# Patient Record
Sex: Female | Born: 1935 | Race: White | Hispanic: No | Smoking: Never smoker
Health system: Southern US, Community
[De-identification: ages and names within clinical notes are randomized; demographics above are authoritative.]

## PROBLEM LIST (undated history)

## (undated) DIAGNOSIS — Z9109 Other allergy status, other than to drugs and biological substances: Secondary | ICD-10-CM

## (undated) DIAGNOSIS — M1990 Unspecified osteoarthritis, unspecified site: Secondary | ICD-10-CM

## (undated) DIAGNOSIS — K649 Unspecified hemorrhoids: Secondary | ICD-10-CM

## (undated) DIAGNOSIS — R040 Epistaxis: Secondary | ICD-10-CM

## (undated) DIAGNOSIS — N8111 Cystocele, midline: Secondary | ICD-10-CM

## (undated) DIAGNOSIS — K635 Polyp of colon: Secondary | ICD-10-CM

## (undated) DIAGNOSIS — G4730 Sleep apnea, unspecified: Secondary | ICD-10-CM

## (undated) DIAGNOSIS — F32A Depression, unspecified: Secondary | ICD-10-CM

## (undated) DIAGNOSIS — K219 Gastro-esophageal reflux disease without esophagitis: Secondary | ICD-10-CM

## (undated) DIAGNOSIS — R453 Demoralization and apathy: Secondary | ICD-10-CM

## (undated) DIAGNOSIS — G43009 Migraine without aura, not intractable, without status migrainosus: Secondary | ICD-10-CM

## (undated) DIAGNOSIS — E782 Mixed hyperlipidemia: Secondary | ICD-10-CM

## (undated) DIAGNOSIS — H169 Unspecified keratitis: Secondary | ICD-10-CM

## (undated) DIAGNOSIS — M461 Sacroiliitis, not elsewhere classified: Secondary | ICD-10-CM

## (undated) DIAGNOSIS — I10 Essential (primary) hypertension: Secondary | ICD-10-CM

## (undated) DIAGNOSIS — J329 Chronic sinusitis, unspecified: Secondary | ICD-10-CM

## (undated) DIAGNOSIS — G2589 Other specified extrapyramidal and movement disorders: Secondary | ICD-10-CM

## (undated) DIAGNOSIS — N952 Postmenopausal atrophic vaginitis: Secondary | ICD-10-CM

## (undated) DIAGNOSIS — E559 Vitamin D deficiency, unspecified: Secondary | ICD-10-CM

## (undated) DIAGNOSIS — F329 Major depressive disorder, single episode, unspecified: Secondary | ICD-10-CM

## (undated) HISTORY — DX: Other allergy status, other than to drugs and biological substances: Z91.09

## (undated) HISTORY — DX: Demoralization and apathy: R45.3

## (undated) HISTORY — DX: Major depressive disorder, single episode, unspecified: F32.9

## (undated) HISTORY — PX: SPINE SURGERY: SHX786

## (undated) HISTORY — DX: Unspecified osteoarthritis, unspecified site: M19.90

## (undated) HISTORY — DX: Depression, unspecified: F32.A

## (undated) HISTORY — DX: Sacroiliitis, not elsewhere classified: M46.1

## (undated) HISTORY — DX: Other specified extrapyramidal and movement disorders: G25.89

## (undated) HISTORY — PX: ENDOMETRIAL BIOPSY: SHX622

## (undated) HISTORY — DX: Postmenopausal atrophic vaginitis: N95.2

## (undated) HISTORY — DX: Unspecified hemorrhoids: K64.9

## (undated) HISTORY — DX: Migraine without aura, not intractable, without status migrainosus: G43.009

## (undated) HISTORY — DX: Mixed hyperlipidemia: E78.2

## (undated) HISTORY — DX: Vitamin D deficiency, unspecified: E55.9

## (undated) HISTORY — DX: Unspecified keratitis: H16.9

## (undated) HISTORY — DX: Polyp of colon: K63.5

## (undated) HISTORY — DX: Chronic sinusitis, unspecified: J32.9

## (undated) HISTORY — DX: Cystocele, midline: N81.11

## (undated) HISTORY — DX: Epistaxis: R04.0

## (undated) HISTORY — DX: Essential (primary) hypertension: I10

---

## 1968-07-13 HISTORY — PX: DILATION AND CURETTAGE OF UTERUS: SHX78

## 2005-01-09 ENCOUNTER — Ambulatory Visit: Payer: Self-pay | Admitting: Ophthalmology

## 2005-01-19 ENCOUNTER — Ambulatory Visit: Payer: Self-pay | Admitting: Ophthalmology

## 2005-02-11 ENCOUNTER — Ambulatory Visit: Payer: Self-pay | Admitting: Ophthalmology

## 2005-02-16 ENCOUNTER — Ambulatory Visit: Payer: Self-pay | Admitting: Ophthalmology

## 2005-07-13 HISTORY — PX: ROTATOR CUFF REPAIR: SHX139

## 2012-01-05 DIAGNOSIS — S300XXA Contusion of lower back and pelvis, initial encounter: Secondary | ICD-10-CM | POA: Diagnosis not present

## 2012-01-05 DIAGNOSIS — R39198 Other difficulties with micturition: Secondary | ICD-10-CM | POA: Diagnosis not present

## 2012-02-24 DIAGNOSIS — E782 Mixed hyperlipidemia: Secondary | ICD-10-CM | POA: Diagnosis not present

## 2012-02-24 DIAGNOSIS — M949 Disorder of cartilage, unspecified: Secondary | ICD-10-CM | POA: Diagnosis not present

## 2012-02-24 DIAGNOSIS — I10 Essential (primary) hypertension: Secondary | ICD-10-CM | POA: Diagnosis not present

## 2012-02-24 DIAGNOSIS — Z0000 Encounter for general adult medical examination without abnormal findings: Secondary | ICD-10-CM | POA: Diagnosis not present

## 2012-02-24 DIAGNOSIS — M899 Disorder of bone, unspecified: Secondary | ICD-10-CM | POA: Diagnosis not present

## 2012-02-24 DIAGNOSIS — E559 Vitamin D deficiency, unspecified: Secondary | ICD-10-CM | POA: Diagnosis not present

## 2012-03-29 DIAGNOSIS — M810 Age-related osteoporosis without current pathological fracture: Secondary | ICD-10-CM | POA: Diagnosis not present

## 2012-06-29 DIAGNOSIS — N8111 Cystocele, midline: Secondary | ICD-10-CM | POA: Diagnosis not present

## 2012-07-14 DIAGNOSIS — H16219 Exposure keratoconjunctivitis, unspecified eye: Secondary | ICD-10-CM | POA: Diagnosis not present

## 2012-08-30 DIAGNOSIS — M461 Sacroiliitis, not elsewhere classified: Secondary | ICD-10-CM | POA: Diagnosis not present

## 2012-10-04 DIAGNOSIS — M810 Age-related osteoporosis without current pathological fracture: Secondary | ICD-10-CM | POA: Diagnosis not present

## 2013-03-06 DIAGNOSIS — Z1331 Encounter for screening for depression: Secondary | ICD-10-CM | POA: Insufficient documentation

## 2013-03-06 DIAGNOSIS — G2589 Other specified extrapyramidal and movement disorders: Secondary | ICD-10-CM

## 2013-03-06 DIAGNOSIS — R453 Demoralization and apathy: Secondary | ICD-10-CM

## 2013-03-06 DIAGNOSIS — G43009 Migraine without aura, not intractable, without status migrainosus: Secondary | ICD-10-CM

## 2013-03-06 DIAGNOSIS — IMO0002 Reserved for concepts with insufficient information to code with codable children: Secondary | ICD-10-CM | POA: Insufficient documentation

## 2013-03-06 DIAGNOSIS — M461 Sacroiliitis, not elsewhere classified: Secondary | ICD-10-CM | POA: Insufficient documentation

## 2013-03-06 DIAGNOSIS — E559 Vitamin D deficiency, unspecified: Secondary | ICD-10-CM | POA: Insufficient documentation

## 2013-03-06 DIAGNOSIS — N952 Postmenopausal atrophic vaginitis: Secondary | ICD-10-CM | POA: Insufficient documentation

## 2013-03-06 DIAGNOSIS — N8111 Cystocele, midline: Secondary | ICD-10-CM | POA: Insufficient documentation

## 2013-03-06 DIAGNOSIS — Z1239 Encounter for other screening for malignant neoplasm of breast: Secondary | ICD-10-CM | POA: Insufficient documentation

## 2013-03-06 DIAGNOSIS — E782 Mixed hyperlipidemia: Secondary | ICD-10-CM

## 2013-03-06 DIAGNOSIS — J301 Allergic rhinitis due to pollen: Secondary | ICD-10-CM | POA: Insufficient documentation

## 2013-03-06 DIAGNOSIS — R450 Nervousness: Secondary | ICD-10-CM | POA: Insufficient documentation

## 2013-03-06 DIAGNOSIS — E785 Hyperlipidemia, unspecified: Secondary | ICD-10-CM | POA: Insufficient documentation

## 2013-03-06 HISTORY — DX: Migraine without aura, not intractable, without status migrainosus: G43.009

## 2013-03-06 HISTORY — DX: Postmenopausal atrophic vaginitis: N95.2

## 2013-03-06 HISTORY — DX: Other specified extrapyramidal and movement disorders: G25.89

## 2013-03-06 HISTORY — DX: Demoralization and apathy: R45.3

## 2013-03-06 HISTORY — DX: Sacroiliitis, not elsewhere classified: M46.1

## 2013-03-06 HISTORY — DX: Vitamin D deficiency, unspecified: E55.9

## 2013-03-06 HISTORY — DX: Cystocele, midline: N81.11

## 2013-03-06 HISTORY — DX: Mixed hyperlipidemia: E78.2

## 2013-04-25 DIAGNOSIS — M810 Age-related osteoporosis without current pathological fracture: Secondary | ICD-10-CM | POA: Diagnosis not present

## 2013-05-09 DIAGNOSIS — M545 Low back pain, unspecified: Secondary | ICD-10-CM | POA: Diagnosis not present

## 2013-05-09 DIAGNOSIS — Z23 Encounter for immunization: Secondary | ICD-10-CM | POA: Diagnosis not present

## 2013-05-16 DIAGNOSIS — M5137 Other intervertebral disc degeneration, lumbosacral region: Secondary | ICD-10-CM | POA: Diagnosis not present

## 2013-05-17 DIAGNOSIS — J301 Allergic rhinitis due to pollen: Secondary | ICD-10-CM | POA: Diagnosis not present

## 2013-05-17 DIAGNOSIS — M79609 Pain in unspecified limb: Secondary | ICD-10-CM | POA: Diagnosis not present

## 2013-05-17 DIAGNOSIS — J310 Chronic rhinitis: Secondary | ICD-10-CM | POA: Diagnosis not present

## 2013-05-17 DIAGNOSIS — Z23 Encounter for immunization: Secondary | ICD-10-CM | POA: Diagnosis not present

## 2013-05-17 DIAGNOSIS — I10 Essential (primary) hypertension: Secondary | ICD-10-CM | POA: Insufficient documentation

## 2013-05-17 DIAGNOSIS — G259 Extrapyramidal and movement disorder, unspecified: Secondary | ICD-10-CM | POA: Diagnosis not present

## 2013-05-17 DIAGNOSIS — G43909 Migraine, unspecified, not intractable, without status migrainosus: Secondary | ICD-10-CM | POA: Diagnosis not present

## 2013-05-17 DIAGNOSIS — K1370 Unspecified lesions of oral mucosa: Secondary | ICD-10-CM | POA: Diagnosis not present

## 2013-05-17 DIAGNOSIS — H2700 Aphakia, unspecified eye: Secondary | ICD-10-CM | POA: Diagnosis not present

## 2013-05-17 DIAGNOSIS — R450 Nervousness: Secondary | ICD-10-CM | POA: Diagnosis not present

## 2013-05-17 DIAGNOSIS — N952 Postmenopausal atrophic vaginitis: Secondary | ICD-10-CM | POA: Diagnosis not present

## 2013-05-17 DIAGNOSIS — E559 Vitamin D deficiency, unspecified: Secondary | ICD-10-CM | POA: Diagnosis not present

## 2013-05-17 DIAGNOSIS — M549 Dorsalgia, unspecified: Secondary | ICD-10-CM | POA: Diagnosis not present

## 2013-05-17 DIAGNOSIS — M461 Sacroiliitis, not elsewhere classified: Secondary | ICD-10-CM | POA: Diagnosis not present

## 2013-05-17 DIAGNOSIS — E782 Mixed hyperlipidemia: Secondary | ICD-10-CM | POA: Diagnosis not present

## 2013-05-17 DIAGNOSIS — Z0000 Encounter for general adult medical examination without abnormal findings: Secondary | ICD-10-CM | POA: Diagnosis not present

## 2013-05-17 HISTORY — DX: Essential (primary) hypertension: I10

## 2013-07-26 DIAGNOSIS — G2589 Other specified extrapyramidal and movement disorders: Secondary | ICD-10-CM | POA: Diagnosis not present

## 2013-07-26 DIAGNOSIS — R453 Demoralization and apathy: Secondary | ICD-10-CM | POA: Diagnosis not present

## 2013-07-26 DIAGNOSIS — I10 Essential (primary) hypertension: Secondary | ICD-10-CM | POA: Diagnosis not present

## 2013-07-26 DIAGNOSIS — M461 Sacroiliitis, not elsewhere classified: Secondary | ICD-10-CM | POA: Diagnosis not present

## 2013-08-01 DIAGNOSIS — H169 Unspecified keratitis: Secondary | ICD-10-CM

## 2013-08-01 DIAGNOSIS — M3501 Sicca syndrome with keratoconjunctivitis: Secondary | ICD-10-CM | POA: Insufficient documentation

## 2013-08-01 HISTORY — DX: Unspecified keratitis: H16.9

## 2013-09-12 DIAGNOSIS — R413 Other amnesia: Secondary | ICD-10-CM | POA: Diagnosis not present

## 2013-09-12 DIAGNOSIS — M810 Age-related osteoporosis without current pathological fracture: Secondary | ICD-10-CM | POA: Diagnosis not present

## 2013-09-12 DIAGNOSIS — R5381 Other malaise: Secondary | ICD-10-CM | POA: Diagnosis not present

## 2013-09-12 DIAGNOSIS — E559 Vitamin D deficiency, unspecified: Secondary | ICD-10-CM | POA: Diagnosis not present

## 2013-09-12 DIAGNOSIS — F411 Generalized anxiety disorder: Secondary | ICD-10-CM | POA: Diagnosis not present

## 2013-09-12 DIAGNOSIS — E039 Hypothyroidism, unspecified: Secondary | ICD-10-CM | POA: Diagnosis not present

## 2013-09-12 DIAGNOSIS — R5383 Other fatigue: Secondary | ICD-10-CM | POA: Diagnosis not present

## 2013-09-12 DIAGNOSIS — I10 Essential (primary) hypertension: Secondary | ICD-10-CM | POA: Diagnosis not present

## 2013-10-03 DIAGNOSIS — I10 Essential (primary) hypertension: Secondary | ICD-10-CM | POA: Diagnosis not present

## 2013-10-03 DIAGNOSIS — N182 Chronic kidney disease, stage 2 (mild): Secondary | ICD-10-CM | POA: Diagnosis not present

## 2013-10-03 DIAGNOSIS — R5383 Other fatigue: Secondary | ICD-10-CM | POA: Diagnosis not present

## 2013-10-03 DIAGNOSIS — R1909 Other intra-abdominal and pelvic swelling, mass and lump: Secondary | ICD-10-CM | POA: Diagnosis not present

## 2013-10-03 DIAGNOSIS — R5381 Other malaise: Secondary | ICD-10-CM | POA: Diagnosis not present

## 2013-10-19 DIAGNOSIS — R1909 Other intra-abdominal and pelvic swelling, mass and lump: Secondary | ICD-10-CM | POA: Diagnosis not present

## 2013-11-09 DIAGNOSIS — N182 Chronic kidney disease, stage 2 (mild): Secondary | ICD-10-CM | POA: Diagnosis not present

## 2013-11-16 DIAGNOSIS — F17200 Nicotine dependence, unspecified, uncomplicated: Secondary | ICD-10-CM | POA: Diagnosis not present

## 2013-11-16 DIAGNOSIS — J309 Allergic rhinitis, unspecified: Secondary | ICD-10-CM | POA: Diagnosis not present

## 2013-11-16 DIAGNOSIS — I10 Essential (primary) hypertension: Secondary | ICD-10-CM | POA: Diagnosis not present

## 2013-11-16 DIAGNOSIS — Z0000 Encounter for general adult medical examination without abnormal findings: Secondary | ICD-10-CM | POA: Diagnosis not present

## 2013-11-16 DIAGNOSIS — F411 Generalized anxiety disorder: Secondary | ICD-10-CM | POA: Diagnosis not present

## 2013-11-16 DIAGNOSIS — R300 Dysuria: Secondary | ICD-10-CM | POA: Diagnosis not present

## 2013-11-16 DIAGNOSIS — N182 Chronic kidney disease, stage 2 (mild): Secondary | ICD-10-CM | POA: Diagnosis not present

## 2013-11-16 DIAGNOSIS — M12879 Other specific arthropathies, not elsewhere classified, unspecified ankle and foot: Secondary | ICD-10-CM | POA: Diagnosis not present

## 2013-11-28 DIAGNOSIS — R0982 Postnasal drip: Secondary | ICD-10-CM | POA: Diagnosis not present

## 2013-11-28 DIAGNOSIS — J301 Allergic rhinitis due to pollen: Secondary | ICD-10-CM | POA: Diagnosis not present

## 2013-11-28 DIAGNOSIS — R040 Epistaxis: Secondary | ICD-10-CM | POA: Diagnosis not present

## 2013-12-07 DIAGNOSIS — I10 Essential (primary) hypertension: Secondary | ICD-10-CM | POA: Diagnosis not present

## 2013-12-07 DIAGNOSIS — F411 Generalized anxiety disorder: Secondary | ICD-10-CM | POA: Diagnosis not present

## 2013-12-07 DIAGNOSIS — R413 Other amnesia: Secondary | ICD-10-CM | POA: Diagnosis not present

## 2013-12-07 DIAGNOSIS — N182 Chronic kidney disease, stage 2 (mild): Secondary | ICD-10-CM | POA: Diagnosis not present

## 2013-12-07 DIAGNOSIS — J309 Allergic rhinitis, unspecified: Secondary | ICD-10-CM | POA: Diagnosis not present

## 2013-12-07 DIAGNOSIS — M12879 Other specific arthropathies, not elsewhere classified, unspecified ankle and foot: Secondary | ICD-10-CM | POA: Diagnosis not present

## 2014-01-16 DIAGNOSIS — F17200 Nicotine dependence, unspecified, uncomplicated: Secondary | ICD-10-CM | POA: Diagnosis not present

## 2014-01-16 DIAGNOSIS — R5381 Other malaise: Secondary | ICD-10-CM | POA: Diagnosis not present

## 2014-01-16 DIAGNOSIS — F3289 Other specified depressive episodes: Secondary | ICD-10-CM | POA: Diagnosis not present

## 2014-01-16 DIAGNOSIS — R5383 Other fatigue: Secondary | ICD-10-CM | POA: Diagnosis not present

## 2014-01-16 DIAGNOSIS — F411 Generalized anxiety disorder: Secondary | ICD-10-CM | POA: Diagnosis not present

## 2014-01-16 DIAGNOSIS — F329 Major depressive disorder, single episode, unspecified: Secondary | ICD-10-CM | POA: Diagnosis not present

## 2014-01-16 DIAGNOSIS — I10 Essential (primary) hypertension: Secondary | ICD-10-CM | POA: Diagnosis not present

## 2014-01-16 DIAGNOSIS — M12879 Other specific arthropathies, not elsewhere classified, unspecified ankle and foot: Secondary | ICD-10-CM | POA: Diagnosis not present

## 2014-01-16 DIAGNOSIS — R413 Other amnesia: Secondary | ICD-10-CM | POA: Diagnosis not present

## 2014-01-16 DIAGNOSIS — E039 Hypothyroidism, unspecified: Secondary | ICD-10-CM | POA: Diagnosis not present

## 2014-01-30 DIAGNOSIS — G4720 Circadian rhythm sleep disorder, unspecified type: Secondary | ICD-10-CM | POA: Diagnosis not present

## 2014-01-30 DIAGNOSIS — G4710 Hypersomnia, unspecified: Secondary | ICD-10-CM | POA: Diagnosis not present

## 2014-02-14 DIAGNOSIS — G4720 Circadian rhythm sleep disorder, unspecified type: Secondary | ICD-10-CM | POA: Diagnosis not present

## 2014-02-14 DIAGNOSIS — G4730 Sleep apnea, unspecified: Secondary | ICD-10-CM | POA: Diagnosis not present

## 2014-02-14 DIAGNOSIS — G4710 Hypersomnia, unspecified: Secondary | ICD-10-CM | POA: Diagnosis not present

## 2014-02-22 DIAGNOSIS — G4710 Hypersomnia, unspecified: Secondary | ICD-10-CM | POA: Diagnosis not present

## 2014-02-22 DIAGNOSIS — G4720 Circadian rhythm sleep disorder, unspecified type: Secondary | ICD-10-CM | POA: Diagnosis not present

## 2014-02-22 DIAGNOSIS — G4730 Sleep apnea, unspecified: Secondary | ICD-10-CM | POA: Diagnosis not present

## 2014-03-06 DIAGNOSIS — G4730 Sleep apnea, unspecified: Secondary | ICD-10-CM | POA: Diagnosis not present

## 2014-03-06 DIAGNOSIS — G4710 Hypersomnia, unspecified: Secondary | ICD-10-CM | POA: Diagnosis not present

## 2014-03-12 DIAGNOSIS — I6529 Occlusion and stenosis of unspecified carotid artery: Secondary | ICD-10-CM | POA: Diagnosis not present

## 2014-03-15 DIAGNOSIS — G4710 Hypersomnia, unspecified: Secondary | ICD-10-CM | POA: Diagnosis not present

## 2014-06-28 DIAGNOSIS — G4733 Obstructive sleep apnea (adult) (pediatric): Secondary | ICD-10-CM | POA: Diagnosis not present

## 2014-06-28 DIAGNOSIS — L719 Rosacea, unspecified: Secondary | ICD-10-CM | POA: Diagnosis not present

## 2014-06-28 DIAGNOSIS — G4710 Hypersomnia, unspecified: Secondary | ICD-10-CM | POA: Diagnosis not present

## 2014-08-08 DIAGNOSIS — G4733 Obstructive sleep apnea (adult) (pediatric): Secondary | ICD-10-CM | POA: Diagnosis not present

## 2014-08-29 DIAGNOSIS — G4733 Obstructive sleep apnea (adult) (pediatric): Secondary | ICD-10-CM | POA: Diagnosis not present

## 2014-10-24 DIAGNOSIS — K921 Melena: Secondary | ICD-10-CM | POA: Diagnosis not present

## 2014-10-24 DIAGNOSIS — F329 Major depressive disorder, single episode, unspecified: Secondary | ICD-10-CM | POA: Diagnosis not present

## 2014-10-24 DIAGNOSIS — N950 Postmenopausal bleeding: Secondary | ICD-10-CM | POA: Diagnosis not present

## 2014-10-24 DIAGNOSIS — N390 Urinary tract infection, site not specified: Secondary | ICD-10-CM | POA: Diagnosis not present

## 2014-10-24 DIAGNOSIS — I10 Essential (primary) hypertension: Secondary | ICD-10-CM | POA: Diagnosis not present

## 2014-10-24 DIAGNOSIS — R1084 Generalized abdominal pain: Secondary | ICD-10-CM | POA: Diagnosis not present

## 2014-10-29 DIAGNOSIS — E288 Other ovarian dysfunction: Secondary | ICD-10-CM | POA: Diagnosis not present

## 2014-10-29 DIAGNOSIS — Z124 Encounter for screening for malignant neoplasm of cervix: Secondary | ICD-10-CM | POA: Diagnosis not present

## 2014-10-29 DIAGNOSIS — R1084 Generalized abdominal pain: Secondary | ICD-10-CM | POA: Diagnosis not present

## 2014-10-29 DIAGNOSIS — R5383 Other fatigue: Secondary | ICD-10-CM | POA: Diagnosis not present

## 2014-10-29 DIAGNOSIS — E0781 Sick-euthyroid syndrome: Secondary | ICD-10-CM | POA: Diagnosis not present

## 2014-10-29 DIAGNOSIS — R195 Other fecal abnormalities: Secondary | ICD-10-CM | POA: Diagnosis not present

## 2014-10-29 DIAGNOSIS — E079 Disorder of thyroid, unspecified: Secondary | ICD-10-CM | POA: Diagnosis not present

## 2014-10-29 DIAGNOSIS — D691 Qualitative platelet defects: Secondary | ICD-10-CM | POA: Diagnosis not present

## 2014-10-29 DIAGNOSIS — E230 Hypopituitarism: Secondary | ICD-10-CM | POA: Diagnosis not present

## 2014-10-29 DIAGNOSIS — N950 Postmenopausal bleeding: Secondary | ICD-10-CM | POA: Diagnosis not present

## 2014-10-29 DIAGNOSIS — K921 Melena: Secondary | ICD-10-CM | POA: Diagnosis not present

## 2014-11-15 DIAGNOSIS — N950 Postmenopausal bleeding: Secondary | ICD-10-CM | POA: Diagnosis not present

## 2014-11-15 DIAGNOSIS — R1084 Generalized abdominal pain: Secondary | ICD-10-CM | POA: Diagnosis not present

## 2014-11-20 DIAGNOSIS — R1084 Generalized abdominal pain: Secondary | ICD-10-CM | POA: Diagnosis not present

## 2014-11-20 DIAGNOSIS — F329 Major depressive disorder, single episode, unspecified: Secondary | ICD-10-CM | POA: Diagnosis not present

## 2014-11-20 DIAGNOSIS — Z0001 Encounter for general adult medical examination with abnormal findings: Secondary | ICD-10-CM | POA: Diagnosis not present

## 2014-11-20 DIAGNOSIS — N182 Chronic kidney disease, stage 2 (mild): Secondary | ICD-10-CM | POA: Diagnosis not present

## 2014-11-20 DIAGNOSIS — N390 Urinary tract infection, site not specified: Secondary | ICD-10-CM | POA: Diagnosis not present

## 2014-11-20 DIAGNOSIS — N950 Postmenopausal bleeding: Secondary | ICD-10-CM | POA: Diagnosis not present

## 2014-11-20 DIAGNOSIS — I10 Essential (primary) hypertension: Secondary | ICD-10-CM | POA: Diagnosis not present

## 2014-11-20 DIAGNOSIS — R300 Dysuria: Secondary | ICD-10-CM | POA: Diagnosis not present

## 2014-11-20 DIAGNOSIS — E039 Hypothyroidism, unspecified: Secondary | ICD-10-CM | POA: Diagnosis not present

## 2014-11-30 DIAGNOSIS — N9489 Other specified conditions associated with female genital organs and menstrual cycle: Secondary | ICD-10-CM | POA: Diagnosis not present

## 2014-12-04 DIAGNOSIS — N9489 Other specified conditions associated with female genital organs and menstrual cycle: Secondary | ICD-10-CM | POA: Diagnosis not present

## 2014-12-05 DIAGNOSIS — G4733 Obstructive sleep apnea (adult) (pediatric): Secondary | ICD-10-CM | POA: Diagnosis not present

## 2014-12-07 DIAGNOSIS — N950 Postmenopausal bleeding: Secondary | ICD-10-CM | POA: Diagnosis not present

## 2014-12-07 DIAGNOSIS — N9489 Other specified conditions associated with female genital organs and menstrual cycle: Secondary | ICD-10-CM | POA: Diagnosis not present

## 2014-12-07 DIAGNOSIS — N858 Other specified noninflammatory disorders of uterus: Secondary | ICD-10-CM | POA: Diagnosis not present

## 2014-12-07 DIAGNOSIS — N939 Abnormal uterine and vaginal bleeding, unspecified: Secondary | ICD-10-CM | POA: Diagnosis not present

## 2015-01-18 DIAGNOSIS — N950 Postmenopausal bleeding: Secondary | ICD-10-CM | POA: Diagnosis not present

## 2015-01-31 DIAGNOSIS — G4733 Obstructive sleep apnea (adult) (pediatric): Secondary | ICD-10-CM | POA: Diagnosis not present

## 2015-02-19 DIAGNOSIS — N182 Chronic kidney disease, stage 2 (mild): Secondary | ICD-10-CM | POA: Diagnosis not present

## 2015-02-19 DIAGNOSIS — F329 Major depressive disorder, single episode, unspecified: Secondary | ICD-10-CM | POA: Diagnosis not present

## 2015-02-19 DIAGNOSIS — M150 Primary generalized (osteo)arthritis: Secondary | ICD-10-CM | POA: Diagnosis not present

## 2015-02-19 DIAGNOSIS — I10 Essential (primary) hypertension: Secondary | ICD-10-CM | POA: Diagnosis not present

## 2015-03-26 ENCOUNTER — Other Ambulatory Visit: Payer: Self-pay

## 2015-03-26 ENCOUNTER — Ambulatory Visit (INDEPENDENT_AMBULATORY_CARE_PROVIDER_SITE_OTHER): Payer: Medicare Other | Admitting: Gastroenterology

## 2015-03-26 ENCOUNTER — Encounter: Payer: Self-pay | Admitting: Gastroenterology

## 2015-03-26 VITALS — BP 174/90 | HR 86 | Temp 98.4°F | Ht 63.0 in | Wt 174.0 lb

## 2015-03-26 DIAGNOSIS — K921 Melena: Secondary | ICD-10-CM | POA: Diagnosis not present

## 2015-03-26 NOTE — Progress Notes (Signed)
Gastroenterology Consultation  Referring Provider:     No ref. provider found Primary Care Physician:  Lavera Guise, MD Primary Gastroenterologist:  Dr. Allen Norris     Reason for Consultation:     Black stools        HPI:   Kristen Pennington is a 81 y.o. y/o female referred for consultation & management of black stools by Dr. Humphrey Rolls, Timoteo Gaul, MD.  This patient comes today because she states that she started having black streaks in her underwear. She was concerned that she was bleeding from her uterus and went to a gynecologist. The patient was told that she did not have any sign of bleeding from the genital urinary system. She denies any unexplained weight loss or family history of colon cancer colon polyps. The patient also reports that she had a colonoscopy over 8 years ago. The patient denies any unexplained weight loss. She also states that she has been taking anti-inflammatory medication intermittently for headaches but not on a regular basis. She estimates it to be about once a week. The patient does have crampy abdominal pain in the lower abdomen. There is no report of any nausea or vomiting associated with her black stools.  Past Medical History  Diagnosis Date  . Essential (primary) hypertension 05/17/2013    Last Assessment & Plan:  Her blood pressure is well-controlled. We will plan to continue hydrochlorothiazide and benazepril.  Both are generic and should be affordable on her health plan.   . Atypical migraine 03/06/2013    Last Assessment & Plan:  Since she rarely takes sumatriptan, we will discontinue it. Continue sparing use of OTC migraine products.   . 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   . Inflammation of sacroiliac joint 03/06/2013    Last Assessment & Plan:  She is doing well on her present regimen of fentanyl patch supplemented with sparing use of  tramadol/acetaminophen. We will continue that regimen.   . Cystocele, midline 03/06/2013  . Atrophic vaginitis 03/06/2013    Last Assessment & Plan:  She will plan to continue estradiol vaginal cream.  Prescription was rewritten stipulating use of the generic product.   . Demoralization and apathy 03/06/2013    Last Assessment & Plan:  Since she has been taking bupropion only once a day, I will write the prescription with the correct instructions and correct quantity. She has done well on this for years and she is encouraged to take it regularly   . Ceratitis 08/01/2013    Last Assessment & Plan:  Because of the expense, she will discontinue Restasis. She will use over-the-counter moisturizing eyedrops and liquid gel.   . Combined fat and carbohydrate induced hyperlipemia 03/06/2013    Last Assessment & Plan:  Recheck fasting lipids.   . Avitaminosis D 03/06/2013    Last Assessment & Plan:  Recheck vitamin D level. Plan to continue vitamin D supplementation.     Past Surgical History  Procedure Laterality Date  . Spine surgery    . Rotator cuff repair  2007  . Dilation and curettage of uterus  1970    Prior to Admission medications   Medication Sig Start Date End Date Taking? Authorizing Provider  benazepril (LOTENSIN) 20 MG tablet  03/04/15  Yes Historical Provider, MD  bisoprolol-hydrochlorothiazide [facility]) 2.5-6.25 MG per tablet  02/04/15  Yes Historical Provider, MD  Cholecalciferol (  VITAMIN D3 SUPER STRENGTH) 2000 UNITS TABS Take by mouth.   Yes Historical Provider, MD  clonazePAM (KLONOPIN) 1 MG tablet  03/04/15  Yes Historical Provider, MD  diphenhydramine-acetaminophen (ACETAMINOPHEN PM EX ST) 25-500 MG TABS Take by mouth.   Yes Historical Provider, MD  escitalopram (LEXAPRO) 10 MG tablet Take 10 mg by mouth daily.   Yes Historical Provider, MD  estradiol (ESTRACE) 0.1 MG/GM vaginal cream Place vaginally. 07/26/13  Yes Historical Provider, MD  fentaNYL (DURAGESIC - DOSED MCG/HR) 25 MCG/HR  patch  03/07/15  Yes Historical Provider, MD  fluticasone (FLONASE) 50 MCG/ACT nasal spray 2 sprays by Each Nare route daily. 07/26/13  Yes Historical Provider, MD  montelukast (SINGULAIR) 10 MG tablet  03/04/15  Yes Historical Provider, MD  Multiple Vitamins-Minerals (MULTIVITAMIN ADULT PO) Take by mouth.   Yes Historical Provider, MD  omeprazole (PRILOSEC) 40 MG capsule  02/08/15  Yes Historical Provider, MD  promethazine (PHENERGAN) 25 MG tablet Take 25 mg by mouth every 6 (six) hours as needed for nausea or vomiting.   Yes Historical Provider, MD  rOPINIRole (REQUIP) 1 MG tablet  03/04/15  Yes Historical Provider, MD  traMADol Veatrice Bourbon) 50 MG tablet  03/04/15  Yes Historical Provider, MD  traMADol-acetaminophen (ULTRACET) 37.5-325 MG per tablet  01/08/15  Yes Historical Provider, MD  hydrochlorothiazide (HYDRODIURIL) 50 MG tablet Take 50 mg by mouth. 07/26/13   Historical Provider, MD    Family History  Problem Relation Age of Onset  . Alzheimer's disease Father   . Arthritis Mother   . Heart disease Sister   . Cancer Brother      Social History  Substance Use Topics  . Smoking status: Never Smoker   . Smokeless tobacco: Never Used  . Alcohol Use: No    Allergies as of 03/26/2015  . (No Known Allergies)    Review of Systems:    All systems reviewed and negative except where noted in HPI.   Physical Exam:  BP 174/90 mmHg  Pulse 86  Temp(Src) 98.4 F (36.9 C) (Oral)  Ht 5\' 3"  (1.6 m)  Wt 174 lb (78.926 kg)  BMI 30.83 kg/m2 No LMP recorded. Psych:  Alert and cooperative. Normal mood and affect. General:   Alert,  Well-developed, well-nourished, pleasant and cooperative in NAD Head:  Normocephalic and atraumatic. Eyes:  Sclera clear, no icterus.   Conjunctiva pink. Ears:  Normal auditory acuity. Nose:  No deformity, discharge, or lesions. Mouth:  No deformity or lesions,oropharynx pink & moist. Neck:  Supple; no masses or thyromegaly. Lungs:  Respirations even and unlabored.   Clear throughout to auscultation.   No wheezes, crackles, or rhonchi. No acute distress. Heart:  Regular rate and rhythm; no murmurs, clicks, rubs, or gallops. Abdomen:  Normal bowel sounds.  No bruits.  Soft, non-tender and non-distended without masses, hepatosplenomegaly or hernias noted.  No guarding or rebound tenderness.  Negative Carnett sign.   Rectal:  Deferred.  Msk:  Symmetrical with changes consistent with arthritis. Good, equal movement & strength bilaterally. Pulses:  Normal pulses noted. Extremities:  No clubbing or edema.  No cyanosis. Neurologic:  Alert and oriented x3;  grossly normal neurologically. Skin:  Intact without significant lesions or rashes.  No jaundice. Lymph Nodes:  No significant cervical adenopathy. Psych:  Alert and cooperative. Normal mood and affect.  Imaging Studies: No results found.  Assessment and Plan:   Kristen Pennington is a 81 y.o. y/o female who comes in with black stools. The patient states that  she was evaluated by GYN and it does not appear to be coming from a genital urinary problem. The patient will be set up for an EGD and colonoscopy to look for a source of her black stools. I have discussed risks & benefits which include, but are not limited to, bleeding, infection, perforation & drug reaction.  The patient agrees with this plan & written consent will be obtained.      Note: This dictation was prepared with Dragon dictation along with smaller phrase technology. Any transcriptional errors that result from this process are unintentional.

## 2015-03-29 ENCOUNTER — Telehealth: Payer: Self-pay | Admitting: Gastroenterology

## 2015-03-29 NOTE — Telephone Encounter (Signed)
Patient was returning your phone call regarding moving the appointment for her colonoscopy. She would like to come on the 21st if all possible. Please call.

## 2015-04-15 DIAGNOSIS — M5136 Other intervertebral disc degeneration, lumbar region: Secondary | ICD-10-CM | POA: Diagnosis not present

## 2015-04-15 DIAGNOSIS — M5134 Other intervertebral disc degeneration, thoracic region: Secondary | ICD-10-CM | POA: Diagnosis not present

## 2015-04-24 DIAGNOSIS — G4733 Obstructive sleep apnea (adult) (pediatric): Secondary | ICD-10-CM | POA: Diagnosis not present

## 2015-04-25 ENCOUNTER — Encounter: Payer: Self-pay | Admitting: *Deleted

## 2015-04-26 ENCOUNTER — Telehealth: Payer: Self-pay | Admitting: Gastroenterology

## 2015-04-26 NOTE — Telephone Encounter (Signed)
Pt just wanted me to let you know. She stated she didn't want a call back.

## 2015-04-26 NOTE — Telephone Encounter (Signed)
Patient has called and wants to talk to a nurse regarding what happen during a recent bowel movement, per patient it looked like afterbirth and she was concerned, patient does have a colonoscopy on 10/21 per patient since then she is no longer passing any black substance.

## 2015-05-02 NOTE — Discharge Instructions (Signed)

## 2015-05-03 ENCOUNTER — Ambulatory Visit: Payer: Medicare Other | Admitting: Anesthesiology

## 2015-05-03 ENCOUNTER — Ambulatory Visit
Admission: RE | Admit: 2015-05-03 | Discharge: 2015-05-03 | Disposition: A | Discharge status: Home / Self Care | Payer: Medicare Other | Source: Ambulatory Visit | Attending: Gastroenterology | Admitting: Gastroenterology

## 2015-05-03 ENCOUNTER — Other Ambulatory Visit: Payer: Self-pay | Admitting: Gastroenterology

## 2015-05-03 ENCOUNTER — Encounter
Admission: RE | Disposition: A | Discharge status: Home / Self Care | Payer: Self-pay | Source: Ambulatory Visit | Attending: Gastroenterology

## 2015-05-03 DIAGNOSIS — K641 Second degree hemorrhoids: Secondary | ICD-10-CM | POA: Diagnosis not present

## 2015-05-03 DIAGNOSIS — Z8249 Family history of ischemic heart disease and other diseases of the circulatory system: Secondary | ICD-10-CM | POA: Diagnosis not present

## 2015-05-03 DIAGNOSIS — Z79899 Other long term (current) drug therapy: Secondary | ICD-10-CM | POA: Diagnosis not present

## 2015-05-03 DIAGNOSIS — G259 Extrapyramidal and movement disorder, unspecified: Secondary | ICD-10-CM | POA: Diagnosis not present

## 2015-05-03 DIAGNOSIS — Z8261 Family history of arthritis: Secondary | ICD-10-CM | POA: Insufficient documentation

## 2015-05-03 DIAGNOSIS — E7801 Familial hypercholesterolemia: Secondary | ICD-10-CM | POA: Diagnosis not present

## 2015-05-03 DIAGNOSIS — G4730 Sleep apnea, unspecified: Secondary | ICD-10-CM | POA: Insufficient documentation

## 2015-05-03 DIAGNOSIS — D120 Benign neoplasm of cecum: Secondary | ICD-10-CM | POA: Diagnosis not present

## 2015-05-03 DIAGNOSIS — K921 Melena: Secondary | ICD-10-CM | POA: Diagnosis not present

## 2015-05-03 DIAGNOSIS — Z809 Family history of malignant neoplasm, unspecified: Secondary | ICD-10-CM | POA: Diagnosis not present

## 2015-05-03 DIAGNOSIS — K635 Polyp of colon: Secondary | ICD-10-CM | POA: Diagnosis not present

## 2015-05-03 DIAGNOSIS — E785 Hyperlipidemia, unspecified: Secondary | ICD-10-CM | POA: Insufficient documentation

## 2015-05-03 DIAGNOSIS — E559 Vitamin D deficiency, unspecified: Secondary | ICD-10-CM | POA: Insufficient documentation

## 2015-05-03 DIAGNOSIS — G43909 Migraine, unspecified, not intractable, without status migrainosus: Secondary | ICD-10-CM | POA: Diagnosis not present

## 2015-05-03 DIAGNOSIS — K5730 Diverticulosis of large intestine without perforation or abscess without bleeding: Secondary | ICD-10-CM | POA: Insufficient documentation

## 2015-05-03 DIAGNOSIS — K219 Gastro-esophageal reflux disease without esophagitis: Secondary | ICD-10-CM | POA: Insufficient documentation

## 2015-05-03 DIAGNOSIS — I10 Essential (primary) hypertension: Secondary | ICD-10-CM | POA: Diagnosis not present

## 2015-05-03 DIAGNOSIS — D123 Benign neoplasm of transverse colon: Secondary | ICD-10-CM | POA: Insufficient documentation

## 2015-05-03 DIAGNOSIS — Z820 Family history of epilepsy and other diseases of the nervous system: Secondary | ICD-10-CM | POA: Insufficient documentation

## 2015-05-03 HISTORY — PX: COLONOSCOPY WITH PROPOFOL: SHX5780

## 2015-05-03 HISTORY — PX: COLONOSCOPY: SHX174

## 2015-05-03 HISTORY — PX: POLYPECTOMY: SHX5525

## 2015-05-03 HISTORY — DX: Sleep apnea, unspecified: G47.30

## 2015-05-03 HISTORY — DX: Gastro-esophageal reflux disease without esophagitis: K21.9

## 2015-05-03 SURGERY — COLONOSCOPY WITH PROPOFOL
Anesthesia: Monitor Anesthesia Care | Wound class: Contaminated

## 2015-05-03 MED ORDER — LACTATED RINGERS IV SOLN: 500.0000 mL | INTRAVENOUS | Status: DC

## 2015-05-03 MED ORDER — ACETAMINOPHEN 160 MG/5ML PO SOLN: 325.0000 mg | ORAL | Status: DC | PRN

## 2015-05-03 MED ORDER — OXYCODONE HCL 5 MG PO TABS: 5.0000 mg | ORAL_TABLET | Freq: Once | ORAL | Status: DC | PRN

## 2015-05-03 MED ORDER — OXYCODONE HCL 5 MG/5ML PO SOLN
5.0000 mg | Freq: Once | ORAL | Status: DC | PRN
Start: 2015-05-03 — End: 2015-05-03

## 2015-05-03 MED ORDER — DEXAMETHASONE SODIUM PHOSPHATE 4 MG/ML IJ SOLN: 8.0000 mg | Freq: Once | INTRAMUSCULAR | Status: DC | PRN

## 2015-05-03 MED ORDER — ACETAMINOPHEN 325 MG PO TABS: 325.0000 mg | ORAL_TABLET | ORAL | Status: DC | PRN

## 2015-05-03 MED ORDER — SIMETHICONE 40 MG/0.6ML PO SUSP
ORAL | Status: DC | PRN
  Administered 2015-05-03: 09:00:00

## 2015-05-03 MED ORDER — PROPOFOL 10 MG/ML IV BOLUS
INTRAVENOUS | Status: DC | PRN
  Administered 2015-05-03: 100 mg via INTRAVENOUS

## 2015-05-03 MED ORDER — LIDOCAINE HCL (CARDIAC) 20 MG/ML IV SOLN
INTRAVENOUS | Status: DC | PRN
  Administered 2015-05-03: 40 mg via INTRAVENOUS

## 2015-05-03 MED ORDER — FENTANYL CITRATE (PF) 100 MCG/2ML IJ SOLN: 25.0000 ug | INTRAMUSCULAR | Status: DC | PRN

## 2015-05-03 MED ORDER — LACTATED RINGERS IV SOLN
INTRAVENOUS | Status: DC
  Administered 2015-05-03: 08:00:00 via INTRAVENOUS

## 2015-05-03 SURGICAL SUPPLY — 28 items
CANISTER SUCT 1200ML W/VALVE (MISCELLANEOUS) ×3 IMPLANT
FCP ESCP3.2XJMB 240X2.8X (MISCELLANEOUS)
FORCEPS BIOP RAD 4 LRG CAP 4 (CUTTING FORCEPS) IMPLANT
FORCEPS BIOP RJ4 240 W/NDL (MISCELLANEOUS)
FORCEPS ESCP3.2XJMB 240X2.8X (MISCELLANEOUS) IMPLANT
GOWN CVR UNV OPN BCK APRN NK (MISCELLANEOUS) ×4 IMPLANT
GOWN ISOL THUMB LOOP REG UNIV (MISCELLANEOUS) ×2
HEMOCLIP INSTINCT (CLIP) IMPLANT
INJECTOR VARIJECT VIN23 (MISCELLANEOUS) IMPLANT
KIT CO2 TUBING (TUBING) IMPLANT
KIT DEFENDO VALVE AND CONN (KITS) IMPLANT
KIT ENDO PROCEDURE OLY (KITS) ×3 IMPLANT
LIGATOR MULTIBAND 6SHOOTER MBL (MISCELLANEOUS) IMPLANT
MARKER SPOT ENDO TATTOO 5ML (MISCELLANEOUS) IMPLANT
PAD GROUND ADULT SPLIT (MISCELLANEOUS) IMPLANT
SNARE SHORT THROW 13M SML OVAL (MISCELLANEOUS) ×3 IMPLANT
SNARE SHORT THROW 30M LRG OVAL (MISCELLANEOUS) IMPLANT
SPOT EX ENDOSCOPIC TATTOO (MISCELLANEOUS)
SUCTION POLY TRAP 4CHAMBER (MISCELLANEOUS) IMPLANT
TRAP SUCTION POLY (MISCELLANEOUS) ×3 IMPLANT
TUBING CONN 6MMX3.1M (TUBING)
TUBING SUCTION CONN 0.25 STRL (TUBING) IMPLANT
UNDERPAD 30X60 958B10 (PK) (MISCELLANEOUS) IMPLANT
VALVE BIOPSY ENDO (VALVE) IMPLANT
VARIJECT INJECTOR VIN23 (MISCELLANEOUS)
WATER AUXILLARY (MISCELLANEOUS) IMPLANT
WATER STERILE IRR 250ML POUR (IV SOLUTION) ×3 IMPLANT
WATER STERILE IRR 500ML POUR (IV SOLUTION) IMPLANT

## 2015-05-03 NOTE — Anesthesia Preprocedure Evaluation (Signed)
Anesthesia Evaluation  Patient identified by MRN, date of birth, ID band Patient awake    Reviewed: Allergy & Precautions, H&P , NPO status , Patient's Chart, lab work & pertinent test results, reviewed documented beta blocker date and time   Airway Mallampati: II  TM Distance: >3 FB Neck ROM: full    Dental no notable dental hx.    Pulmonary sleep apnea and Continuous Positive Airway Pressure Ventilation ,    Pulmonary exam normal breath sounds clear to auscultation       Cardiovascular Exercise Tolerance: Good hypertension, negative cardio ROS   Rhythm:regular Rate:Normal     Neuro/Psych  Headaches, negative psych ROS   GI/Hepatic Neg liver ROS, GERD  Medicated,  Endo/Other  negative endocrine ROS  Renal/GU negative Renal ROS  negative genitourinary   Musculoskeletal   Abdominal   Peds  Hematology negative hematology ROS (+)   Anesthesia Other Findings   Reproductive/Obstetrics negative OB ROS                             Anesthesia Physical Anesthesia Plan  ASA: III  Anesthesia Plan: MAC   Post-op Pain Management:    Induction:   Airway Management Planned:   Additional Equipment:   Intra-op Plan:   Post-operative Plan:   Informed Consent: I have reviewed the patients History and Physical, chart, labs and discussed the procedure including the risks, benefits and alternatives for the proposed anesthesia with the patient or authorized representative who has indicated his/her understanding and acceptance.     Plan Discussed with: CRNA  Anesthesia Plan Comments:         Anesthesia Quick Evaluation

## 2015-05-03 NOTE — Op Note (Addendum)
[facility] Gastroenterology Patient Name: Kristen Pennington Procedure Date: 05/03/2015 8:22 AM MRN: [id number] Account #: [id number] Date of Birth: 1928/10/31 Admit Type: Outpatient Age: 81 Room: [facility] OR ROOM 01 Gender: Female Note Status: Supervisor Override Procedure:         Colonoscopy Indications:       Hematochezia Providers:         Lucilla Lame, MD Referring MD:      Lavera Guise, MD (Referring MD) Medicines:         Propofol per Anesthesia Complications:     No immediate complications. Procedure:         Pre-Anesthesia Assessment:                    - Prior to the procedure, a History and Physical was                     performed, and patient medications and allergies were                     reviewed. The patient's tolerance of previous anesthesia                     was also reviewed. The risks and benefits of the procedure                     and the sedation options and risks were discussed with the                     patient. All questions were answered, and informed consent                     was obtained. Prior Anticoagulants: The patient has taken                     no previous anticoagulant or antiplatelet agents. ASA                     Grade Assessment: II - A patient with mild systemic                     disease. After reviewing the risks and benefits, the                     patient was deemed in satisfactory condition to undergo                     the procedure.                    After obtaining informed consent, the colonoscope was                     passed under direct vision. Throughout the procedure, the                     patient's blood pressure, pulse, and oxygen saturations                     were monitored continuously. The Olympus CF-HQ190L                     Colonoscope (S#. [id number]) was introduced through the anus  and advanced to the the cecum, identified by appendiceal                     orifice  and ileocecal valve. The colonoscopy was performed                     without difficulty. The patient tolerated the procedure                     well. The quality of the bowel preparation was excellent. Findings:      The perianal and digital rectal examinations were normal.      A 5 mm polyp was found in the cecum. The polyp was sessile. The polyp       was removed with a cold snare. Resection and retrieval were complete.      Two sessile polyps were found in the transverse colon. The polyps were 5       to 6 mm in size. These polyps were removed with a cold snare. Resection       and retrieval were complete.      Multiple small-mouthed diverticula were found in the sigmoid colon.      Non-bleeding internal hemorrhoids were found during retroflexion. The       hemorrhoids were Grade II (internal hemorrhoids that prolapse but reduce       spontaneously). Impression:        - One 5 mm polyp in the cecum. Resected and retrieved.                    - Two 5 to 6 mm polyps in the transverse colon. Resected                     and retrieved.                    - Diverticulosis in the sigmoid colon.                    - Non-bleeding internal hemorrhoids. Recommendation:    - Await pathology results.                    - Repeat colonoscopy in 5 years if polyp adenoma and 10                     years if hyperplastic Procedure Code(s): --- Professional ---                    [phone], Colonoscopy, flexible; with removal of tumor(s),                     polyp(s), or other lesion(s) by snare technique Diagnosis Code(s): --- Professional ---                    K92.1, Melena                    D12.3, Benign neoplasm of transverse colon                    D12.0, Benign neoplasm of cecum CPT copyright 2014 American Medical Association. All rights reserved. The codes documented in this report are preliminary and upon coder review may  be revised to meet current compliance requirements. Lucilla Lame,  MD 05/03/2015 8:47:05 AM This report has been signed electronically.  Number of Addenda: 0 Note Initiated On: 05/03/2015 8:22 AM Scope Withdrawal Time: 0 hours 8 minutes 2 seconds  Total Procedure Duration: 0 hours 15 minutes 40 seconds       [facility]

## 2015-05-03 NOTE — Transfer of Care (Signed)
Immediate Anesthesia Transfer of Care Note  Patient: Kristen Pennington  Procedure(s) Performed: Procedure(s) with comments: COLONOSCOPY WITH PROPOFOL (N/A) - CPAP POLYPECTOMY  Patient Location: PACU  Anesthesia Type: MAC  Level of Consciousness: awake, alert  and patient cooperative  Airway and Oxygen Therapy: Patient Spontanous Breathing and Patient connected to supplemental oxygen  Post-op Assessment: Post-op Vital signs reviewed, Patient's Cardiovascular Status Stable, Respiratory Function Stable, Patent Airway and No signs of Nausea or vomiting  Post-op Vital Signs: Reviewed and stable  Complications: No apparent anesthesia complications

## 2015-05-03 NOTE — H&P (Signed)
[facility]  [address] [place],  [address] Phone: [phone] Fax : [phone]  Primary Care Physician:  Lavera Guise, MD Primary Gastroenterologist:  Dr. Allen Norris  Pre-Procedure History & Physical: HPI:  Kristen Pennington is a 81 y.o. female is here for an colonoscopy.   Past Medical History  Diagnosis Date  . Essential (primary) hypertension 05/17/2013    Last Assessment & Plan:  Her blood pressure is well-controlled. We will plan to continue hydrochlorothiazide and benazepril.  Both are generic and should be affordable on her health plan.   . Atypical migraine 03/06/2013    Last Assessment & Plan:  Since she rarely takes sumatriptan, we will discontinue it. Continue sparing use of OTC migraine products.   . 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   . Inflammation of sacroiliac joint ([place]) 03/06/2013    Last Assessment & Plan:  She is doing well on her present regimen of fentanyl patch supplemented with sparing use of tramadol/acetaminophen. We will continue that regimen.   . Cystocele, midline 03/06/2013  . Atrophic vaginitis 03/06/2013    Last Assessment & Plan:  She will plan to continue estradiol vaginal cream.  Prescription was rewritten stipulating use of the generic product.   . Demoralization and apathy 03/06/2013    Last Assessment & Plan:  Since she has been taking bupropion only once a day, I will write the prescription with the correct instructions and correct quantity. She has done well on this for years and she is encouraged to take it regularly   . Ceratitis 08/01/2013    Last Assessment & Plan:  Because of the expense, she will discontinue Restasis. She will use over-the-counter moisturizing eyedrops and liquid gel.   . Combined fat and carbohydrate induced hyperlipemia 03/06/2013    Last Assessment & Plan:  Recheck fasting lipids.     . Avitaminosis D 03/06/2013    Last Assessment & Plan:  Recheck vitamin D level. Plan to continue vitamin D supplementation.   Marland Kitchen GERD (gastroesophageal reflux disease)   . Sleep apnea     CPAP    Past Surgical History  Procedure Laterality Date  . Spine surgery    . Rotator cuff repair  2007  . Dilation and curettage of uterus  1970    Prior to Admission medications   Medication Sig Start Date End Date Taking? Authorizing Provider  docusate sodium (COLACE) 50 MG capsule Take 50 mg by mouth daily as needed for mild constipation. Mon, Wed, Fri   Yes Historical Provider, MD  polyethylene glycol (MIRALAX / GLYCOLAX) packet Take 17 g by mouth daily as needed.   Yes Historical Provider, MD  shark liver oil-cocoa butter (PREPARATION H) 0.25-3-85.5 % suppository Place 1 suppository rectally as needed for hemorrhoids.   Yes Historical Provider, MD  benazepril (LOTENSIN) 20 MG tablet 20 mg. PM 03/04/15   Historical Provider, MD  bisoprolol-hydrochlorothiazide [facility]) 2.5-6.25 MG per tablet  02/04/15   Historical Provider, MD  Cholecalciferol (VITAMIN D3 SUPER STRENGTH) 2000 UNITS TABS Take by mouth.    Historical Provider, MD  clonazePAM (KLONOPIN) 1 MG tablet 1 mg daily. PM 03/04/15   Historical Provider, MD  diphenhydramine-acetaminophen (ACETAMINOPHEN PM EX ST) 25-500 MG TABS Take by mouth.    Historical Provider, MD  escitalopram (LEXAPRO) 10 MG tablet Take 15 mg by mouth daily. AM    Historical  Provider, MD  estradiol (ESTRACE) 0.1 MG/GM vaginal cream Place vaginally as needed.  07/26/13   Historical Provider, MD  fentaNYL (DURAGESIC - DOSED MCG/HR) 25 MCG/HR patch Place onto the skin.  03/07/15   Historical Provider, MD  fluticasone (FLONASE) 50 MCG/ACT nasal spray 2 sprays by Each Nare route daily. 07/26/13   Historical Provider, MD  hydrochlorothiazide (HYDRODIURIL) 50 MG tablet Take 50 mg by mouth. 07/26/13   Historical Provider, MD  montelukast (SINGULAIR) 10 MG tablet 10 mg. AM 03/04/15    Historical Provider, MD  Multiple Vitamins-Minerals (MULTIVITAMIN ADULT PO) Take by mouth daily.     Historical Provider, MD  omeprazole (PRILOSEC) 40 MG capsule Take 40 mg by mouth daily. AM 02/08/15   Historical Provider, MD  promethazine (PHENERGAN) 25 MG tablet Take 25 mg by mouth every 6 (six) hours as needed for nausea or vomiting.    Historical Provider, MD  rOPINIRole (REQUIP) 1 MG tablet Take 1 mg by mouth daily. PM 03/04/15   Historical Provider, MD  traMADol (ULTRAM) 50 MG tablet Take 50 mg by mouth 2 (two) times daily.  03/04/15   Historical Provider, MD  traMADol-acetaminophen Caroline Sauger) 37.5-325 MG per tablet  01/08/15   Historical Provider, MD    Allergies as of 03/26/2015  . (No Known Allergies)    Family History  Problem Relation Age of Onset  . Alzheimer's disease Father   . Arthritis Mother   . Heart disease Sister   . Cancer Brother     Social History   Social History  . Marital Status: Widowed    Spouse Name: N/A  . Number of Children: N/A  . Years of Education: N/A   Occupational History  . Not on file.   Social History Main Topics  . Smoking status: Never Smoker   . Smokeless tobacco: Never Used  . Alcohol Use: No  . Drug Use: No  . Sexual Activity: Not on file   Other Topics Concern  . Not on file   Social History Narrative    Review of Systems: See HPI, otherwise negative ROS  Physical Exam: Ht 5\' 3"  (1.6 m)  Wt 135 lb (61.236 kg)  BMI 23.92 kg/m2 General:   Alert,  pleasant and cooperative in NAD Head:  Normocephalic and atraumatic. Neck:  Supple; no masses or thyromegaly. Lungs:  Clear throughout to auscultation.    Heart:  Regular rate and rhythm. Abdomen:  Soft, nontender and nondistended. Normal bowel sounds, without guarding, and without rebound.   Neurologic:  Alert and  oriented x4;  grossly normal neurologically.  Impression/Plan: Kristen Pennington is here for an colonoscopy to be performed for Blood in stool  Risks,  benefits, limitations, and alternatives regarding  colonoscopy have been reviewed with the patient.  Questions have been answered.  All parties agreeable.   Ollen Bowl, MD  05/03/2015, 7:53 AM

## 2015-05-03 NOTE — Anesthesia Procedure Notes (Signed)
Procedure Name: MAC Performed by: Lauraine Crespo Pre-anesthesia Checklist: Patient identified, Emergency Drugs available, Suction available, Timeout performed and Patient being monitored Patient Re-evaluated:Patient Re-evaluated prior to inductionOxygen Delivery Method: Nasal cannula Placement Confirmation: positive ETCO2     

## 2015-05-03 NOTE — Anesthesia Postprocedure Evaluation (Signed)
  Anesthesia Post-op Note  Patient: Kristen Pennington  Procedure(s) Performed: Procedure(s) with comments: COLONOSCOPY WITH PROPOFOL (N/A) - CPAP POLYPECTOMY  Anesthesia type:MAC  Patient location: PACU  Post pain: Pain level controlled  Post assessment: Post-op Vital signs reviewed, Patient's Cardiovascular Status Stable, Respiratory Function Stable, Patent Airway and No signs of Nausea or vomiting  Post vital signs: Reviewed and stable  Last Vitals:  Filed Vitals:   05/03/15 0901  BP:   Pulse: 62  Temp:   Resp: 13    Level of consciousness: awake, alert  and patient cooperative  Complications: No apparent anesthesia complications

## 2015-05-06 ENCOUNTER — Encounter: Payer: Self-pay | Admitting: Gastroenterology

## 2015-05-14 ENCOUNTER — Encounter: Payer: Self-pay | Admitting: Gastroenterology

## 2015-07-14 HISTORY — PX: EYE SURGERY: SHX253

## 2015-07-18 DIAGNOSIS — L02414 Cutaneous abscess of left upper limb: Secondary | ICD-10-CM | POA: Diagnosis not present

## 2015-08-12 DIAGNOSIS — I10 Essential (primary) hypertension: Secondary | ICD-10-CM | POA: Diagnosis not present

## 2015-08-12 DIAGNOSIS — D485 Neoplasm of uncertain behavior of skin: Secondary | ICD-10-CM | POA: Diagnosis not present

## 2015-08-12 DIAGNOSIS — G4733 Obstructive sleep apnea (adult) (pediatric): Secondary | ICD-10-CM | POA: Diagnosis not present

## 2015-08-13 ENCOUNTER — Encounter: Payer: Self-pay | Admitting: *Deleted

## 2015-08-14 DIAGNOSIS — G4733 Obstructive sleep apnea (adult) (pediatric): Secondary | ICD-10-CM | POA: Diagnosis not present

## 2015-08-22 ENCOUNTER — Ambulatory Visit (INDEPENDENT_AMBULATORY_CARE_PROVIDER_SITE_OTHER): Payer: Medicare Other | Admitting: General Surgery

## 2015-08-22 ENCOUNTER — Encounter: Payer: Self-pay | Admitting: General Surgery

## 2015-08-22 VITALS — BP 120/70 | HR 74 | Resp 12 | Ht 60.0 in | Wt 139.0 lb

## 2015-08-22 DIAGNOSIS — R229 Localized swelling, mass and lump, unspecified: Secondary | ICD-10-CM | POA: Diagnosis not present

## 2015-08-22 NOTE — Progress Notes (Signed)
Patient ID: [name], female   DOB: February 07, 1929, 82 y.o.   MRN: [id number]  Chief Complaint  Patient presents with  . Mass    nodule vs abscess left arm    HPI Kristen Pennington is a 82 y.o. female here today for a evaluation of a left arm knot. Patient states she noticed this area about a month. She states the area is red and has been using Bactroban cream for three weeks. No history of skin cancers. I have reviewed the history of present illness with the patient.  HPI  Past Medical History  Diagnosis Date  . Essential (primary) hypertension 05/17/2013    Last Assessment & Plan:  Her blood pressure is well-controlled. We will plan to continue hydrochlorothiazide and benazepril.  Both are generic and should be affordable on her health plan.   . Atypical migraine 03/06/2013    Last Assessment & Plan:  Since she rarely takes sumatriptan, we will discontinue it. Continue sparing use of OTC migraine products.   . 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   . Inflammation of sacroiliac joint ([place]) 03/06/2013    Last Assessment & Plan:  She is doing well on her present regimen of fentanyl patch supplemented with sparing use of tramadol/acetaminophen. We will continue that regimen.   . Cystocele, midline 03/06/2013  . Atrophic vaginitis 03/06/2013    Last Assessment & Plan:  She will plan to continue estradiol vaginal cream.  Prescription was rewritten stipulating use of the generic product.   . Demoralization and apathy 03/06/2013    Last Assessment & Plan:  Since she has been taking bupropion only once a day, I will write the prescription with the correct instructions and correct quantity. She has done well on this for years and she is encouraged to take it regularly   . Ceratitis 08/01/2013    Last Assessment & Plan:  Because of the expense, she will discontinue Restasis. She will  use over-the-counter moisturizing eyedrops and liquid gel.   . Combined fat and carbohydrate induced hyperlipemia 03/06/2013    Last Assessment & Plan:  Recheck fasting lipids.   . Avitaminosis D 03/06/2013    Last Assessment & Plan:  Recheck vitamin D level. Plan to continue vitamin D supplementation.   Marland Kitchen GERD (gastroesophageal reflux disease)   . Sleep apnea     CPAP  . Hemorrhoid   . Colon polyp     Past Surgical History  Procedure Laterality Date  . Spine surgery    . Rotator cuff repair  2007  . Dilation and curettage of uterus  1970  . Colonoscopy with propofol N/A 05/03/2015    Procedure: COLONOSCOPY WITH PROPOFOL;  Surgeon: Lucilla Lame, MD;  Location: [facility];  Service: Endoscopy;  Laterality: N/A;  CPAP  . Polypectomy  05/03/2015    Procedure: POLYPECTOMY;  Surgeon: Lucilla Lame, MD;  Location: [facility];  Service: Endoscopy;;  . Colonoscopy  05-03-15    Family History  Problem Relation Age of Onset  . Alzheimer's disease Father   . Arthritis Mother   . Heart disease Sister   . Cancer Brother     Social History Social History  Substance Use Topics  . Smoking status: Never Smoker   . Smokeless tobacco: Never Used  . Alcohol Use: No    No Known Allergies  Current Outpatient Prescriptions  Medication  Sig Dispense Refill  . benazepril (LOTENSIN) 20 MG tablet 20 mg. PM  11  . Cholecalciferol (VITAMIN D3 SUPER STRENGTH) 2000 UNITS TABS Take by mouth.    . clonazePAM (KLONOPIN) 1 MG tablet 1 mg daily. PM  2  . diphenhydramine-acetaminophen (ACETAMINOPHEN PM EX ST) 25-500 MG TABS Take by mouth.    . docusate sodium (COLACE) 50 MG capsule Take 50 mg by mouth daily as needed for mild constipation. Mon, Wed, Fri    . DOXYCYCLINE HYCLATE PO Take 100 mg by mouth.    . escitalopram (LEXAPRO) 10 MG tablet Take 15 mg by mouth daily. AM    . estradiol (ESTRACE) 0.1 MG/GM vaginal cream Place vaginally as needed.     . fentaNYL (DURAGESIC - DOSED MCG/HR)  25 MCG/HR patch Place onto the skin.   0  . fluticasone (FLONASE) 50 MCG/ACT nasal spray 2 sprays by Each Nare route daily.    . hydrochlorothiazide (HYDRODIURIL) 50 MG tablet Take 50 mg by mouth.    . montelukast (SINGULAIR) 10 MG tablet 10 mg. AM  10  . Multiple Vitamins-Minerals (MULTIVITAMIN ADULT PO) Take by mouth daily.     . mupirocin ointment (BACTROBAN) 2 %     . omeprazole (PRILOSEC) 40 MG capsule Take 40 mg by mouth daily. AM  0  . polyethylene glycol (MIRALAX / GLYCOLAX) packet Take 17 g by mouth daily as needed.    . promethazine (PHENERGAN) 25 MG tablet Take 25 mg by mouth every 6 (six) hours as needed for nausea or vomiting.    Marland Kitchen rOPINIRole (REQUIP) 1 MG tablet Take 1 mg by mouth daily. PM  10  . shark liver oil-cocoa butter (PREPARATION H) 0.25-3-85.5 % suppository Place 1 suppository rectally as needed for hemorrhoids.    . traMADol (ULTRAM) 50 MG tablet Take 50 mg by mouth 2 (two) times daily.   0  . traMADol-acetaminophen (ULTRACET) 37.5-325 MG per tablet   1   No current facility-administered medications for this visit.    Review of Systems Review of Systems  Constitutional: Negative.   Respiratory: Negative.   Cardiovascular: Negative.     Blood pressure 120/70, pulse 74, resp. rate 12, height 5' (1.524 m), weight 139 lb (63.05 kg).  Physical Exam Physical Exam  Constitutional: She is oriented to person, place, and time. She appears well-developed and well-nourished.  Eyes: Conjunctivae are normal. No scleral icterus.  Neck: Neck supple. No thyromegaly present.  Cardiovascular: Normal rate, regular rhythm and normal heart sounds.   Pulmonary/Chest: Effort normal and breath sounds normal.  Lymphadenopathy:    She has no cervical adenopathy.    She has no axillary adenopathy.     Neurological: She is alert and oriented to person, place, and time.  Skin: Skin is warm and dry.       Data Reviewed Notes from PCP reviewed.  Assessment    Skin nodule-  left arm. Suspicious for BCC or SCC    Plan    Recommend excision of skin nodule with margins in the left arm. Patient to return for this procedure in the office. Risks, benefits, and procedure explained to patient.    PCP: Clayborn Bigness M  This information has been scribed by Gaspar Cola CMA.    SANKAR,SEEPLAPUTHUR G 08/22/2015, 9:28 AM

## 2015-08-22 NOTE — Patient Instructions (Signed)
Return for excision of left arm nodule.

## 2015-08-26 ENCOUNTER — Encounter: Payer: Self-pay | Admitting: General Surgery

## 2015-08-26 ENCOUNTER — Ambulatory Visit (INDEPENDENT_AMBULATORY_CARE_PROVIDER_SITE_OTHER): Payer: Medicare Other | Admitting: General Surgery

## 2015-08-26 VITALS — BP 138/74 | HR 74 | Resp 12 | Ht 60.0 in | Wt 141.0 lb

## 2015-08-26 DIAGNOSIS — G4733 Obstructive sleep apnea (adult) (pediatric): Secondary | ICD-10-CM | POA: Diagnosis not present

## 2015-08-26 DIAGNOSIS — G4710 Hypersomnia, unspecified: Secondary | ICD-10-CM | POA: Diagnosis not present

## 2015-08-26 DIAGNOSIS — L820 Inflamed seborrheic keratosis: Secondary | ICD-10-CM | POA: Diagnosis not present

## 2015-08-26 DIAGNOSIS — D485 Neoplasm of uncertain behavior of skin: Secondary | ICD-10-CM | POA: Diagnosis not present

## 2015-08-26 DIAGNOSIS — D2112 Benign neoplasm of connective and other soft tissue of left upper limb, including shoulder: Secondary | ICD-10-CM | POA: Diagnosis not present

## 2015-08-26 DIAGNOSIS — R2232 Localized swelling, mass and lump, left upper limb: Secondary | ICD-10-CM

## 2015-08-26 NOTE — Progress Notes (Signed)
This is a 82 year old female here today for excision left arm mass.    Procedure: excision suspicious skin nodule left upper arm, lateral  Anesthetic: 30ml 1%xylocaine mixed with 0.5% marcaine  Prep: Chloro Prep  Description: Left upper arm was placed on a mayo stand. After area was prepped and draped, vertical elliptical incision made with 2-94mm margin all around. Skin nodule was fully excised. Bleeders were cauterized. Skin closed with interrupted vertical mattress sutures of 4-0 nylon. Neosporin oint, telfa and tegaderm placed Procedure well tolerated, no immediate problems.  Pt advised on wound care  Patient to return in 10-14 days suture removal nurse. PCP:  Clayborn Bigness  This information has been scribed by Gaspar Cola CMA.

## 2015-08-26 NOTE — Patient Instructions (Signed)
Patient to return in 7 -10 days suture removal nurse.

## 2015-09-02 ENCOUNTER — Telehealth: Payer: Self-pay | Admitting: *Deleted

## 2015-09-02 NOTE — Telephone Encounter (Signed)
Notified patient as instructed, patient pleased. Discussed follow-up appointments, patient agrees  

## 2015-09-02 NOTE — Telephone Encounter (Signed)
-----   Message from Christene Lye, MD sent at 09/02/2015  6:05 AM EST ----- Please let pt pt know the pathology was normal.

## 2015-09-05 ENCOUNTER — Ambulatory Visit: Payer: Medicare Other | Admitting: *Deleted

## 2015-09-05 DIAGNOSIS — R2232 Localized swelling, mass and lump, left upper limb: Secondary | ICD-10-CM

## 2015-09-05 NOTE — Progress Notes (Signed)
The sutures were removed and steri strips applied. 

## 2015-09-26 DIAGNOSIS — R195 Other fecal abnormalities: Secondary | ICD-10-CM | POA: Diagnosis not present

## 2015-10-28 DIAGNOSIS — G4733 Obstructive sleep apnea (adult) (pediatric): Secondary | ICD-10-CM | POA: Diagnosis not present

## 2015-10-28 DIAGNOSIS — K5900 Constipation, unspecified: Secondary | ICD-10-CM | POA: Diagnosis not present

## 2015-10-28 DIAGNOSIS — F411 Generalized anxiety disorder: Secondary | ICD-10-CM | POA: Diagnosis not present

## 2015-11-02 DIAGNOSIS — F411 Generalized anxiety disorder: Secondary | ICD-10-CM | POA: Diagnosis not present

## 2015-11-02 DIAGNOSIS — M150 Primary generalized (osteo)arthritis: Secondary | ICD-10-CM | POA: Diagnosis not present

## 2015-11-02 DIAGNOSIS — K5900 Constipation, unspecified: Secondary | ICD-10-CM | POA: Diagnosis not present

## 2015-11-02 DIAGNOSIS — N182 Chronic kidney disease, stage 2 (mild): Secondary | ICD-10-CM | POA: Diagnosis not present

## 2015-11-02 DIAGNOSIS — I119 Hypertensive heart disease without heart failure: Secondary | ICD-10-CM | POA: Diagnosis not present

## 2015-11-02 DIAGNOSIS — F329 Major depressive disorder, single episode, unspecified: Secondary | ICD-10-CM | POA: Diagnosis not present

## 2015-11-02 DIAGNOSIS — G4733 Obstructive sleep apnea (adult) (pediatric): Secondary | ICD-10-CM | POA: Diagnosis not present

## 2015-11-02 DIAGNOSIS — Z79891 Long term (current) use of opiate analgesic: Secondary | ICD-10-CM | POA: Diagnosis not present

## 2015-11-05 DIAGNOSIS — F411 Generalized anxiety disorder: Secondary | ICD-10-CM | POA: Diagnosis not present

## 2015-11-05 DIAGNOSIS — K5900 Constipation, unspecified: Secondary | ICD-10-CM | POA: Diagnosis not present

## 2015-11-05 DIAGNOSIS — F329 Major depressive disorder, single episode, unspecified: Secondary | ICD-10-CM | POA: Diagnosis not present

## 2015-11-05 DIAGNOSIS — I119 Hypertensive heart disease without heart failure: Secondary | ICD-10-CM | POA: Diagnosis not present

## 2015-11-05 DIAGNOSIS — M150 Primary generalized (osteo)arthritis: Secondary | ICD-10-CM | POA: Diagnosis not present

## 2015-11-05 DIAGNOSIS — N182 Chronic kidney disease, stage 2 (mild): Secondary | ICD-10-CM | POA: Diagnosis not present

## 2015-11-07 DIAGNOSIS — I119 Hypertensive heart disease without heart failure: Secondary | ICD-10-CM | POA: Diagnosis not present

## 2015-11-07 DIAGNOSIS — F411 Generalized anxiety disorder: Secondary | ICD-10-CM | POA: Diagnosis not present

## 2015-11-07 DIAGNOSIS — K5900 Constipation, unspecified: Secondary | ICD-10-CM | POA: Diagnosis not present

## 2015-11-07 DIAGNOSIS — N182 Chronic kidney disease, stage 2 (mild): Secondary | ICD-10-CM | POA: Diagnosis not present

## 2015-11-07 DIAGNOSIS — F329 Major depressive disorder, single episode, unspecified: Secondary | ICD-10-CM | POA: Diagnosis not present

## 2015-11-07 DIAGNOSIS — M150 Primary generalized (osteo)arthritis: Secondary | ICD-10-CM | POA: Diagnosis not present

## 2015-11-21 DIAGNOSIS — F411 Generalized anxiety disorder: Secondary | ICD-10-CM | POA: Diagnosis not present

## 2015-11-21 DIAGNOSIS — N182 Chronic kidney disease, stage 2 (mild): Secondary | ICD-10-CM | POA: Diagnosis not present

## 2015-11-21 DIAGNOSIS — F329 Major depressive disorder, single episode, unspecified: Secondary | ICD-10-CM | POA: Diagnosis not present

## 2015-11-21 DIAGNOSIS — I119 Hypertensive heart disease without heart failure: Secondary | ICD-10-CM | POA: Diagnosis not present

## 2015-11-21 DIAGNOSIS — K5900 Constipation, unspecified: Secondary | ICD-10-CM | POA: Diagnosis not present

## 2015-11-21 DIAGNOSIS — M150 Primary generalized (osteo)arthritis: Secondary | ICD-10-CM | POA: Diagnosis not present

## 2015-11-28 DIAGNOSIS — K5900 Constipation, unspecified: Secondary | ICD-10-CM | POA: Diagnosis not present

## 2015-11-28 DIAGNOSIS — F329 Major depressive disorder, single episode, unspecified: Secondary | ICD-10-CM | POA: Diagnosis not present

## 2015-11-28 DIAGNOSIS — N182 Chronic kidney disease, stage 2 (mild): Secondary | ICD-10-CM | POA: Diagnosis not present

## 2015-11-28 DIAGNOSIS — I119 Hypertensive heart disease without heart failure: Secondary | ICD-10-CM | POA: Diagnosis not present

## 2015-11-28 DIAGNOSIS — M150 Primary generalized (osteo)arthritis: Secondary | ICD-10-CM | POA: Diagnosis not present

## 2015-11-28 DIAGNOSIS — F411 Generalized anxiety disorder: Secondary | ICD-10-CM | POA: Diagnosis not present

## 2015-12-04 DIAGNOSIS — K5900 Constipation, unspecified: Secondary | ICD-10-CM | POA: Diagnosis not present

## 2015-12-04 DIAGNOSIS — M150 Primary generalized (osteo)arthritis: Secondary | ICD-10-CM | POA: Diagnosis not present

## 2015-12-04 DIAGNOSIS — I119 Hypertensive heart disease without heart failure: Secondary | ICD-10-CM | POA: Diagnosis not present

## 2015-12-04 DIAGNOSIS — R1084 Generalized abdominal pain: Secondary | ICD-10-CM | POA: Diagnosis not present

## 2015-12-04 DIAGNOSIS — N182 Chronic kidney disease, stage 2 (mild): Secondary | ICD-10-CM | POA: Diagnosis not present

## 2015-12-04 DIAGNOSIS — F329 Major depressive disorder, single episode, unspecified: Secondary | ICD-10-CM | POA: Diagnosis not present

## 2015-12-04 DIAGNOSIS — K5909 Other constipation: Secondary | ICD-10-CM | POA: Diagnosis not present

## 2015-12-04 DIAGNOSIS — K921 Melena: Secondary | ICD-10-CM | POA: Diagnosis not present

## 2015-12-04 DIAGNOSIS — F411 Generalized anxiety disorder: Secondary | ICD-10-CM | POA: Diagnosis not present

## 2015-12-05 DIAGNOSIS — K921 Melena: Secondary | ICD-10-CM | POA: Insufficient documentation

## 2015-12-16 DIAGNOSIS — K921 Melena: Secondary | ICD-10-CM | POA: Diagnosis not present

## 2015-12-26 DIAGNOSIS — Z0001 Encounter for general adult medical examination with abnormal findings: Secondary | ICD-10-CM | POA: Diagnosis not present

## 2015-12-30 ENCOUNTER — Other Ambulatory Visit: Payer: Self-pay | Admitting: Student

## 2015-12-30 DIAGNOSIS — R109 Unspecified abdominal pain: Secondary | ICD-10-CM | POA: Diagnosis not present

## 2015-12-30 DIAGNOSIS — K5900 Constipation, unspecified: Secondary | ICD-10-CM | POA: Diagnosis not present

## 2015-12-30 DIAGNOSIS — R140 Abdominal distension (gaseous): Secondary | ICD-10-CM | POA: Diagnosis not present

## 2015-12-30 DIAGNOSIS — G8929 Other chronic pain: Secondary | ICD-10-CM | POA: Diagnosis not present

## 2015-12-30 DIAGNOSIS — Z01812 Encounter for preprocedural laboratory examination: Secondary | ICD-10-CM | POA: Diagnosis not present

## 2015-12-30 DIAGNOSIS — R141 Gas pain: Secondary | ICD-10-CM | POA: Diagnosis not present

## 2015-12-30 DIAGNOSIS — Z0001 Encounter for general adult medical examination with abnormal findings: Secondary | ICD-10-CM | POA: Diagnosis not present

## 2015-12-30 DIAGNOSIS — K5909 Other constipation: Secondary | ICD-10-CM | POA: Diagnosis not present

## 2015-12-30 DIAGNOSIS — R1084 Generalized abdominal pain: Secondary | ICD-10-CM | POA: Diagnosis not present

## 2015-12-30 DIAGNOSIS — I10 Essential (primary) hypertension: Secondary | ICD-10-CM | POA: Diagnosis not present

## 2015-12-30 DIAGNOSIS — N810 Urethrocele: Secondary | ICD-10-CM | POA: Diagnosis not present

## 2015-12-30 DIAGNOSIS — Z23 Encounter for immunization: Secondary | ICD-10-CM | POA: Diagnosis not present

## 2016-01-08 ENCOUNTER — Ambulatory Visit
Admission: RE | Admit: 2016-01-08 | Discharge: 2016-01-08 | Disposition: A | Discharge status: Home / Self Care | Payer: Medicare Other | Source: Ambulatory Visit | Attending: Student | Admitting: Student

## 2016-01-08 DIAGNOSIS — K760 Fatty (change of) liver, not elsewhere classified: Secondary | ICD-10-CM | POA: Diagnosis not present

## 2016-01-08 DIAGNOSIS — N950 Postmenopausal bleeding: Secondary | ICD-10-CM | POA: Diagnosis not present

## 2016-01-08 DIAGNOSIS — K5900 Constipation, unspecified: Secondary | ICD-10-CM | POA: Diagnosis not present

## 2016-01-08 DIAGNOSIS — G8929 Other chronic pain: Secondary | ICD-10-CM | POA: Diagnosis not present

## 2016-01-08 DIAGNOSIS — R109 Unspecified abdominal pain: Secondary | ICD-10-CM | POA: Diagnosis not present

## 2016-01-08 DIAGNOSIS — Z9889 Other specified postprocedural states: Secondary | ICD-10-CM | POA: Diagnosis not present

## 2016-01-08 DIAGNOSIS — K5909 Other constipation: Secondary | ICD-10-CM | POA: Diagnosis not present

## 2016-01-08 DIAGNOSIS — Q438 Other specified congenital malformations of intestine: Secondary | ICD-10-CM | POA: Insufficient documentation

## 2016-01-08 MED ORDER — IOPAMIDOL (ISOVUE-300) INJECTION 61%
85.0000 mL | Freq: Once | INTRAVENOUS | Status: AC | PRN
  Administered 2016-01-08: 85 mL via INTRAVENOUS

## 2016-01-16 DIAGNOSIS — M8589 Other specified disorders of bone density and structure, multiple sites: Secondary | ICD-10-CM | POA: Diagnosis not present

## 2016-01-16 DIAGNOSIS — M8580 Other specified disorders of bone density and structure, unspecified site: Secondary | ICD-10-CM | POA: Diagnosis not present

## 2016-01-16 DIAGNOSIS — M810 Age-related osteoporosis without current pathological fracture: Secondary | ICD-10-CM | POA: Diagnosis not present

## 2016-02-12 DIAGNOSIS — M62830 Muscle spasm of back: Secondary | ICD-10-CM | POA: Diagnosis not present

## 2016-02-12 DIAGNOSIS — M5136 Other intervertebral disc degeneration, lumbar region: Secondary | ICD-10-CM | POA: Diagnosis not present

## 2016-02-12 DIAGNOSIS — M5134 Other intervertebral disc degeneration, thoracic region: Secondary | ICD-10-CM | POA: Diagnosis not present

## 2016-02-24 DIAGNOSIS — G2581 Restless legs syndrome: Secondary | ICD-10-CM | POA: Diagnosis not present

## 2016-02-24 DIAGNOSIS — G4733 Obstructive sleep apnea (adult) (pediatric): Secondary | ICD-10-CM | POA: Diagnosis not present

## 2016-03-13 ENCOUNTER — Other Ambulatory Visit: Payer: Self-pay

## 2016-03-20 ENCOUNTER — Encounter: Payer: Self-pay | Admitting: Physician Assistant

## 2016-03-20 ENCOUNTER — Encounter: Payer: Self-pay | Admitting: Internal Medicine

## 2016-03-20 ENCOUNTER — Encounter: Payer: Self-pay | Admitting: Nurse Practitioner

## 2016-04-06 DIAGNOSIS — H35373 Puckering of macula, bilateral: Secondary | ICD-10-CM | POA: Diagnosis not present

## 2016-04-08 ENCOUNTER — Encounter: Payer: Self-pay | Admitting: Obstetrics and Gynecology

## 2016-04-08 ENCOUNTER — Ambulatory Visit (INDEPENDENT_AMBULATORY_CARE_PROVIDER_SITE_OTHER): Payer: Medicare Other | Admitting: Obstetrics and Gynecology

## 2016-04-08 VITALS — BP 125/66 | HR 97 | Ht 64.0 in | Wt 142.0 lb

## 2016-04-08 DIAGNOSIS — N950 Postmenopausal bleeding: Secondary | ICD-10-CM

## 2016-04-08 DIAGNOSIS — D259 Leiomyoma of uterus, unspecified: Secondary | ICD-10-CM | POA: Diagnosis not present

## 2016-04-08 NOTE — Patient Instructions (Signed)
1. No biopsy is needed today 2. Maintain menstrual calendar monitoring for any vaginal bleeding. 3. Return in February 2018 for follow-up on abnormal bleeding

## 2016-04-08 NOTE — Progress Notes (Signed)
GYN ENCOUNTER NOTE  Subjective:       Kristen Pennington is a 82 y.o. G52P2002 female is here for gynecologic evaluation of the following issues:  1. Postmenopausal bleeding  Patient noted postmenopausal bleeding/vaginal discharge since April 2017, not associated with any pelvic pain. Workup to date has included: 01/08/2016 CT scan of the abdomen and pelvis with contrast-atrophic uterus with small calcified uterine fibroid measuring 5 mm; no adnexal masses 01/08/2016 ultrasound-1.2 mm endometrial stripe; multiple nonspecific calcifications within uterus likely consistent with myomatous change .     Gynecologic History No LMP recorded. Patient is postmenopausal. Contraception: post menopausal status   Obstetric History OB History  Gravida Para Term Preterm AB Living  2 2 2     2   SAB TAB Ectopic Multiple Live Births          2    # Outcome Date GA Lbr Len/2nd Weight Sex Delivery Anes PTL Lv  2 Term 1959   7 lb 8 oz (3.402 kg) M Vag-Spont   LIV  1 Term 1955   7 lb 8 oz (3.402 kg) F Vag-Spont   LIV    Obstetric Comments  1st Menstrual Cycle:  16   1st Pregnancy:  25    Past Medical History:  Diagnosis Date  . Atrophic vaginitis 03/06/2013   Last Assessment & Plan:  She will plan to continue estradiol vaginal cream.  Prescription was rewritten stipulating use of the generic product.   . Atypical migraine 03/06/2013   Last Assessment & Plan:  Since she rarely takes sumatriptan, we will discontinue it. Continue sparing use of OTC migraine products.   . Avitaminosis D 03/06/2013   Last Assessment & Plan:  Recheck vitamin D level. Plan to continue vitamin D supplementation.   . Ceratitis 08/01/2013   Last Assessment & Plan:  Because of the expense, she will discontinue Restasis. She will use over-the-counter moisturizing eyedrops and liquid gel.   . Colon polyp   . 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   .  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   . Cystocele, midline 03/06/2013  . Demoralization and apathy 03/06/2013   Last Assessment & Plan:  Since she has been taking bupropion only once a day, I will write the prescription with the correct instructions and correct quantity. She has done well on this for years and she is encouraged to take it regularly   . Essential (primary) hypertension 05/17/2013   Last Assessment & Plan:  Her blood pressure is well-controlled. We will plan to continue hydrochlorothiazide and benazepril.  Both are generic and should be affordable on her health plan.   Marland Kitchen GERD (gastroesophageal reflux disease)   . Hemorrhoid   . Inflammation of sacroiliac joint ([place]) 03/06/2013   Last Assessment & Plan:  She is doing well on her present regimen of fentanyl patch supplemented with sparing use of tramadol/acetaminophen. We will continue that regimen.   . Sleep apnea    CPAP    Past Surgical History:  Procedure Laterality Date  . COLONOSCOPY  05-03-15  . COLONOSCOPY WITH PROPOFOL N/A 05/03/2015   Procedure: COLONOSCOPY WITH PROPOFOL;  Surgeon: Lucilla Lame, MD;  Location: [facility];  Service: Endoscopy;  Laterality: N/A;  CPAP  . DILATION AND CURETTAGE OF UTERUS  1970  . POLYPECTOMY  05/03/2015  Procedure: POLYPECTOMY;  Surgeon: Lucilla Lame, MD;  Location: [facility];  Service: Endoscopy;;  . ROTATOR CUFF REPAIR  2007  . SPINE SURGERY      Current Outpatient Prescriptions on File Prior to Visit  Medication Sig Dispense Refill  . Cholecalciferol (VITAMIN D3 SUPER STRENGTH) 2000 UNITS TABS Take by mouth.    . clonazePAM (KLONOPIN) 1 MG tablet 1 mg daily. PM  2  . diphenhydramine-acetaminophen (ACETAMINOPHEN PM EX ST) 25-500 MG TABS Take by mouth.    . docusate sodium (COLACE) 50 MG capsule Take 50 mg by mouth daily as needed for mild constipation.  Mon, Wed, Fri    . escitalopram (LEXAPRO) 10 MG tablet Take 15 mg by mouth daily. AM    . estradiol (ESTRACE) 0.1 MG/GM vaginal cream Place vaginally as needed.     . fentaNYL (DURAGESIC - DOSED MCG/HR) 25 MCG/HR patch Place onto the skin.   0  . fluticasone (FLONASE) 50 MCG/ACT nasal spray 2 sprays by Each Nare route daily.    . hydrochlorothiazide (HYDRODIURIL) 50 MG tablet Take 50 mg by mouth.    Marland Kitchen omeprazole (PRILOSEC) 40 MG capsule Take 40 mg by mouth daily. AM  0  . polyethylene glycol (MIRALAX / GLYCOLAX) packet Take 17 g by mouth daily as needed.    . promethazine (PHENERGAN) 25 MG tablet Take 25 mg by mouth every 6 (six) hours as needed for nausea or vomiting.    Marland Kitchen rOPINIRole (REQUIP) 1 MG tablet Take 1 mg by mouth daily. PM  10  . shark liver oil-cocoa butter (PREPARATION H) 0.25-3-85.5 % suppository Place 1 suppository rectally as needed for hemorrhoids.    . traMADol-acetaminophen (ULTRACET) 37.5-325 MG per tablet   1   No current facility-administered medications on file prior to visit.     Allergies  Allergen Reactions  . Linaclotide Swelling    Social History   Social History  . Marital status: Widowed    Spouse name: N/A  . Number of children: N/A  . Years of education: N/A   Occupational History  . Not on file.   Social History Main Topics  . Smoking status: Never Smoker  . Smokeless tobacco: Never Used  . Alcohol use No  . Drug use: No  . Sexual activity: Not Currently   Other Topics Concern  . Not on file   Social History Narrative  . No narrative on file    Family History  Problem Relation Age of Onset  . Arthritis Mother   . Alzheimer's disease Father   . Heart disease Sister   . Cancer Brother   . Ovarian cancer Neg Hx   . Breast cancer Neg Hx   . Colon cancer Neg Hx   . Diabetes Neg Hx     The following portions of the patient's history were reviewed and updated as appropriate: allergies, current medications, past family history, past  medical history, past social history, past surgical history and problem list.  Review of Systems Review of Systems - Per history of present illness  Objective:   BP 125/66   Pulse 97   Ht 5\' 4"  (1.626 m)   Wt 142 lb (64.4 kg)   BMI 24.37 kg/m  CONSTITUTIONAL: Well-developed, well-nourished female in no acute distress. Alert and oriented HENT:  Normocephalic, atraumatic.  NECK: Not examined SKIN: Skin is warm and dry. No rash noted. Not diaphoretic. No erythema. No pallor. [place]: Alert and oriented to person, place, and time. PSYCHIATRIC: Normal mood  and affect. Normal behavior. Normal judgment and thought content. CARDIOVASCULAR:Not Examined RESPIRATORY: Not Examined BREASTS: Not Examined ABDOMEN: Soft, non distended; Non tender.  No Organomegaly. PELVIC:  External Genitalia: Atrophic changes  BUS: Normal  Vagina: Moderate atrophy  Cervix: Normal; no lesions; no cervical motion tenderness  Uterus: Normal size, shape,consistency, mobile, nontender  Adnexa: Normal; nonpalpable and nontender  RV: Normal external exam  Bladder: Nontender MUSCULOSKELETAL: Normal range of motion. No tenderness.  No cyanosis, clubbing, or edema.     Assessment:   1. PMB (postmenopausal bleeding)  2. Uterine leiomyoma, unspecified location   CT scan ultrasound demonstrates an atrophic appearing uterus with calcifications consistent with possible myomatous change; endometrial stripe was thin measuring 1.2 mm on ultrasound. No need for endometrial biopsy today. Physical exam is unremarkable.     Plan:   1. Findings from radiologic studies were reviewed with patient 2. Patient understands the reason for no biopsy today 3. Patient monitor for postmenopausal bleeding with menstrual calendar monitoring 4. Patient is to return in February 2018 for follow-up (6 months after most recent radiologic studies) If patient has persistent bleeding, reassessment will be made with ultrasound with  consideration for biopsy.   A total of 30 minutes were spent face-to-face with the patient during the encounter with greater than 50% dealing with counseling and coordination of care.  Brayton Mars, MD  Note: This dictation was prepared with Dragon dictation along with smaller phrase technology. Any transcriptional errors that result from this process are unintentional.

## 2016-04-11 DIAGNOSIS — Z23 Encounter for immunization: Secondary | ICD-10-CM | POA: Diagnosis not present

## 2016-05-12 DIAGNOSIS — M150 Primary generalized (osteo)arthritis: Secondary | ICD-10-CM | POA: Diagnosis not present

## 2016-05-12 DIAGNOSIS — R1310 Dysphagia, unspecified: Secondary | ICD-10-CM | POA: Diagnosis not present

## 2016-05-12 DIAGNOSIS — G2581 Restless legs syndrome: Secondary | ICD-10-CM | POA: Diagnosis not present

## 2016-05-12 DIAGNOSIS — F411 Generalized anxiety disorder: Secondary | ICD-10-CM | POA: Diagnosis not present

## 2016-05-12 DIAGNOSIS — K5900 Constipation, unspecified: Secondary | ICD-10-CM | POA: Diagnosis not present

## 2016-05-12 DIAGNOSIS — I10 Essential (primary) hypertension: Secondary | ICD-10-CM | POA: Diagnosis not present

## 2016-05-22 DIAGNOSIS — H35373 Puckering of macula, bilateral: Secondary | ICD-10-CM | POA: Diagnosis not present

## 2016-06-01 DIAGNOSIS — E782 Mixed hyperlipidemia: Secondary | ICD-10-CM | POA: Diagnosis not present

## 2016-06-01 DIAGNOSIS — Z961 Presence of intraocular lens: Secondary | ICD-10-CM | POA: Diagnosis not present

## 2016-06-01 DIAGNOSIS — M461 Sacroiliitis, not elsewhere classified: Secondary | ICD-10-CM | POA: Diagnosis not present

## 2016-06-01 DIAGNOSIS — H35371 Puckering of macula, right eye: Secondary | ICD-10-CM | POA: Diagnosis not present

## 2016-06-01 DIAGNOSIS — I10 Essential (primary) hypertension: Secondary | ICD-10-CM | POA: Diagnosis not present

## 2016-06-30 DIAGNOSIS — K581 Irritable bowel syndrome with constipation: Secondary | ICD-10-CM | POA: Diagnosis not present

## 2016-06-30 DIAGNOSIS — K219 Gastro-esophageal reflux disease without esophagitis: Secondary | ICD-10-CM | POA: Diagnosis not present

## 2016-08-31 DIAGNOSIS — M150 Primary generalized (osteo)arthritis: Secondary | ICD-10-CM | POA: Diagnosis not present

## 2016-08-31 DIAGNOSIS — G4733 Obstructive sleep apnea (adult) (pediatric): Secondary | ICD-10-CM | POA: Diagnosis not present

## 2016-08-31 DIAGNOSIS — G4710 Hypersomnia, unspecified: Secondary | ICD-10-CM | POA: Diagnosis not present

## 2016-08-31 DIAGNOSIS — G2581 Restless legs syndrome: Secondary | ICD-10-CM | POA: Diagnosis not present

## 2016-08-31 DIAGNOSIS — R5383 Other fatigue: Secondary | ICD-10-CM | POA: Diagnosis not present

## 2016-08-31 DIAGNOSIS — I10 Essential (primary) hypertension: Secondary | ICD-10-CM | POA: Diagnosis not present

## 2016-09-08 ENCOUNTER — Ambulatory Visit (INDEPENDENT_AMBULATORY_CARE_PROVIDER_SITE_OTHER): Payer: Medicare Other | Admitting: Obstetrics and Gynecology

## 2016-09-08 ENCOUNTER — Encounter: Payer: Self-pay | Admitting: Obstetrics and Gynecology

## 2016-09-08 VITALS — BP 138/68 | HR 75 | Ht 64.0 in | Wt 146.4 lb

## 2016-09-08 DIAGNOSIS — N950 Postmenopausal bleeding: Secondary | ICD-10-CM | POA: Diagnosis not present

## 2016-09-08 DIAGNOSIS — R350 Frequency of micturition: Secondary | ICD-10-CM | POA: Diagnosis not present

## 2016-09-08 DIAGNOSIS — N858 Other specified noninflammatory disorders of uterus: Secondary | ICD-10-CM | POA: Diagnosis not present

## 2016-09-08 DIAGNOSIS — D259 Leiomyoma of uterus, unspecified: Secondary | ICD-10-CM | POA: Diagnosis not present

## 2016-09-08 LAB — POCT URINALYSIS DIPSTICK
BILIRUBIN UA: NEGATIVE
GLUCOSE UA: NEGATIVE
Ketones, UA: NEGATIVE
NITRITE UA: POSITIVE
Protein, UA: NEGATIVE
Spec Grav, UA: 1.01
Urobilinogen, UA: NEGATIVE
pH, UA: 6

## 2016-09-08 MED ORDER — NITROFURANTOIN MONOHYD MACRO 100 MG PO CAPS: 100.0000 mg | ORAL_CAPSULE | Freq: Two times a day (BID) | ORAL | 0 refills | Status: DC

## 2016-09-08 NOTE — Progress Notes (Signed)
Chief complaint: 1. Post menopausal bleeding  83 year old para 2002 female, on no hormone replacement therapy, presents for 6 month follow-up on postmenopausal bleeding. Previous evaluation in September 2017 was notable for pelvic ultrasound that demonstrated a 1.2 mm endometrial stripe; no biopsy was performed; multiple nonspecific calcifications within the uterus were consistent with probable fibroids.  Over the past 5 months patient has had intermittent vaginal bleeding/discharge. She is noting some increased frequency of urination without dysuria. Bowel movements are normal  Past medical history, past surgical history, problem list, medications, and allergies are reviewed  OBJECTIVE: BP 138/68   Pulse 75   Ht 5\' 4"  (1.626 m)   Wt 146 lb 6.4 oz (66.4 kg)   BMI 25.13 kg/m  Pleasant female in no acute distress Abdomen: Soft, nontender PELVIC:             External Genitalia: Atrophic changes             BUS: Normal             Vagina: Moderate atrophy             Cervix: Normal; no lesions; no cervical motion tenderness             Uterus: Normal size, shape,consistency, mobile, nontender. Midplane.             Adnexa: Normal; nonpalpable and nontender             RV: Normal external exam             Bladder: Nontender  PROCEDURE:  Endometrial Biopsy Procedure Note  Pre-operative Diagnosis: Postmenopausal bleeding  Post-operative Diagnosis: Postmenopausal bleeding  Procedure Details   Urine pregnancy test was not done.  The risks (including infection, bleeding, pain, and uterine perforation) and benefits of the procedure were explained to the patient and Verbal informed consent was obtained.  Antibiotic prophylaxis against endocarditis was not indicated.   The patient was placed in the dorsal lithotomy position.  Bimanual exam showed the uterus to be in the neutral position.  A Graves' speculum inserted in the vagina, and the cervix prepped with povidone iodine.  Endocervical  curettage with a Kevorkian curette was not performed.   A sharp tenaculum was applied to the anterior lip of the cervix for stabilization.  A Mylex 3 mm pipette was used to sound the uterus to a depth of 5.5cm.  A Mylex 59mm curette was used to sample the endometrium-scant tissue.  Sample was sent for pathologic examination.  Condition: Stable  Complications: None  Plan:  The patient was advised to call for any fever or for prolonged or severe pain or bleeding. She was advised to use OTC acetaminophen as needed for mild to moderate pain. She was advised to avoid vaginal intercourse for 48 hours or until the bleeding has completely stopped.  Attending Physician Documentation: Brayton Mars, MD   ASSESSMENT: 1. Postmenopausal bleeding, assisting 2. Prior assessment 6 months ago revealed thin endometrial stripe measuring 1.2 mm and calcified fibroids 3. Cannot rule out bladder as a possible source of bleeding 4. Sampling of endometrium revealed scant tissue today  PLAN: 1. Endometrial biopsy as noted 2. Urinalysis and urine culture 3. Patient will be notified of results and further management planning when available 4. Return in 6 months for follow-up  A total of 15 minutes were spent face-to-face with the patient during this encounter and over half of that time dealt with counseling and coordination of care.  Alanda Slim   [place], MD  Note: This dictation was prepared with Dragon dictation along with smaller phrase technology. Any transcriptional errors that result from this process are unintentional.

## 2016-09-08 NOTE — Patient Instructions (Signed)
1. Endometrial biopsy is performed; results will be made available 2. Monitor for any vaginal bleeding over the next 6 months 3. Return in 6 months for follow-up 4. Urinalysis and urine culture are obtained today    Endometrial Biopsy, Care After This sheet gives you information about how to care for yourself after your procedure. Your health care provider may also give you more specific instructions. If you have problems or questions, contact your health care provider. What can I expect after the procedure? After the procedure, it is common to have:  Mild cramping.  A small amount of vaginal bleeding for a few days. This is normal. Follow these instructions at home:  Take over-the-counter and prescription medicines only as told by your health care provider.  Do not douche, use tampons, or have sexual intercourse until your health care provider approves.  Return to your normal activities as told by your health care provider. Ask your health care provider what activities are safe for you.  Follow instructions from your health care provider about any activity restrictions, such as restrictions on strenuous exercise or heavy lifting. Contact a health care provider if:  You have heavy bleeding, or bleed for longer than 2 days after the procedure.  You have bad smelling discharge from your vagina.  You have a fever or chills.  You have a burning sensation when urinating or you have difficulty urinating.  You have severe pain in your lower abdomen. Get help right away if:  You have severe cramps in your stomach or back.  You pass large blood clots.  Your bleeding increases.  You become weak or light-headed, or you pass out. Summary  After the procedure, it is common to have mild cramping and a small amount of vaginal bleeding for a few days.  Do not douche, use tampons, or have sexual intercourse until your health care provider approves.  Return to your normal activities  as told by your health care provider. Ask your health care provider what activities are safe for you. This information is not intended to replace advice given to you by your health care provider. Make sure you discuss any questions you have with your health care provider. Document Released: 04/19/2013 Document Revised: 07/15/2016 Document Reviewed: 07/15/2016 Elsevier Interactive Patient Education  2017 [employer].

## 2016-09-11 LAB — PATHOLOGY

## 2016-09-13 LAB — URINE CULTURE

## 2016-09-14 DIAGNOSIS — N390 Urinary tract infection, site not specified: Secondary | ICD-10-CM | POA: Diagnosis not present

## 2016-09-14 DIAGNOSIS — I10 Essential (primary) hypertension: Secondary | ICD-10-CM | POA: Diagnosis not present

## 2016-09-14 DIAGNOSIS — K5900 Constipation, unspecified: Secondary | ICD-10-CM | POA: Diagnosis not present

## 2016-09-14 DIAGNOSIS — N950 Postmenopausal bleeding: Secondary | ICD-10-CM | POA: Diagnosis not present

## 2016-09-14 DIAGNOSIS — E039 Hypothyroidism, unspecified: Secondary | ICD-10-CM | POA: Diagnosis not present

## 2016-09-16 DIAGNOSIS — G4733 Obstructive sleep apnea (adult) (pediatric): Secondary | ICD-10-CM | POA: Diagnosis not present

## 2016-09-29 DIAGNOSIS — K219 Gastro-esophageal reflux disease without esophagitis: Secondary | ICD-10-CM | POA: Diagnosis not present

## 2016-09-29 DIAGNOSIS — K581 Irritable bowel syndrome with constipation: Secondary | ICD-10-CM | POA: Diagnosis not present

## 2016-11-11 DIAGNOSIS — I8002 Phlebitis and thrombophlebitis of superficial vessels of left lower extremity: Secondary | ICD-10-CM | POA: Diagnosis not present

## 2016-11-11 DIAGNOSIS — S81802A Unspecified open wound, left lower leg, initial encounter: Secondary | ICD-10-CM | POA: Diagnosis not present

## 2016-11-11 DIAGNOSIS — Z23 Encounter for immunization: Secondary | ICD-10-CM | POA: Diagnosis not present

## 2016-11-27 DIAGNOSIS — H35373 Puckering of macula, bilateral: Secondary | ICD-10-CM | POA: Diagnosis not present

## 2016-11-30 DIAGNOSIS — H35373 Puckering of macula, bilateral: Secondary | ICD-10-CM | POA: Diagnosis not present

## 2016-12-31 DIAGNOSIS — I10 Essential (primary) hypertension: Secondary | ICD-10-CM | POA: Diagnosis not present

## 2016-12-31 DIAGNOSIS — N182 Chronic kidney disease, stage 2 (mild): Secondary | ICD-10-CM | POA: Diagnosis not present

## 2016-12-31 DIAGNOSIS — Z0001 Encounter for general adult medical examination with abnormal findings: Secondary | ICD-10-CM | POA: Diagnosis not present

## 2016-12-31 DIAGNOSIS — R69 Illness, unspecified: Secondary | ICD-10-CM | POA: Diagnosis not present

## 2016-12-31 DIAGNOSIS — M150 Primary generalized (osteo)arthritis: Secondary | ICD-10-CM | POA: Diagnosis not present

## 2017-01-01 DIAGNOSIS — E782 Mixed hyperlipidemia: Secondary | ICD-10-CM | POA: Diagnosis not present

## 2017-01-01 DIAGNOSIS — I10 Essential (primary) hypertension: Secondary | ICD-10-CM | POA: Diagnosis not present

## 2017-02-03 DIAGNOSIS — H35373 Puckering of macula, bilateral: Secondary | ICD-10-CM | POA: Diagnosis not present

## 2017-03-04 DIAGNOSIS — M150 Primary generalized (osteo)arthritis: Secondary | ICD-10-CM | POA: Diagnosis not present

## 2017-03-04 DIAGNOSIS — I10 Essential (primary) hypertension: Secondary | ICD-10-CM | POA: Diagnosis not present

## 2017-03-04 DIAGNOSIS — R69 Illness, unspecified: Secondary | ICD-10-CM | POA: Diagnosis not present

## 2017-03-08 DIAGNOSIS — R51 Headache: Secondary | ICD-10-CM | POA: Diagnosis not present

## 2017-03-08 DIAGNOSIS — G478 Other sleep disorders: Secondary | ICD-10-CM | POA: Diagnosis not present

## 2017-03-08 DIAGNOSIS — G4733 Obstructive sleep apnea (adult) (pediatric): Secondary | ICD-10-CM | POA: Diagnosis not present

## 2017-03-09 ENCOUNTER — Encounter: Payer: Self-pay | Admitting: Obstetrics and Gynecology

## 2017-03-09 ENCOUNTER — Ambulatory Visit (INDEPENDENT_AMBULATORY_CARE_PROVIDER_SITE_OTHER): Payer: Medicare HMO | Admitting: Obstetrics and Gynecology

## 2017-03-09 VITALS — BP 130/72 | HR 73 | Ht 64.0 in | Wt 133.3 lb

## 2017-03-09 DIAGNOSIS — N950 Postmenopausal bleeding: Secondary | ICD-10-CM | POA: Diagnosis not present

## 2017-03-09 DIAGNOSIS — N952 Postmenopausal atrophic vaginitis: Secondary | ICD-10-CM | POA: Diagnosis not present

## 2017-03-09 NOTE — Patient Instructions (Signed)
1. Return as needed if vaginal bleeding recurs. No other follow-up is necessary at this time. 2. If vaginal dryness symptoms become bothersome, recommend Estrace cream intravaginal once a week.

## 2017-03-10 NOTE — Progress Notes (Signed)
Dragon/transcription Corrupted (note documentation limited). BILLING IS BASED ON TIME  Chief complaint: 1.  History of postmenopausal bleeding  No further bleeding since endometrial biopsy last year.  ,pmh Past Surgical History:  Procedure Laterality Date  . COLONOSCOPY  05-03-15  . COLONOSCOPY WITH PROPOFOL N/A 05/03/2015   Procedure: COLONOSCOPY WITH PROPOFOL;  Surgeon: Lucilla Lame, MD;  Location: [facility];  Service: Endoscopy;  Laterality: N/A;  CPAP  . DILATION AND CURETTAGE OF UTERUS  1970  . EYE SURGERY    . POLYPECTOMY  05/03/2015   Procedure: POLYPECTOMY;  Surgeon: Lucilla Lame, MD;  Location: [facility];  Service: Endoscopy;;  . ROTATOR CUFF REPAIR  2007  . SPINE SURGERY     OBJECTIVE: BP 130/72   Pulse 73   Ht 5\' 4"  (1.626 m)   Wt 133 lb 4.8 oz (60.5 kg)   BMI 22.88 kg/m  Pleasant female in no acute distress.Dementia changes noted. Abdomen: Soft, nontender PELVIC: External Genitalia: Atrophic changes BUS: Normal Vagina: Moderate atrophy Cervix: Normal; no lesions; no cervical motion tenderness Uterus: Normal size, shape,consistency, mobile, nontender. Midplane. Adnexa: Normal; nonpalpable and nontender RV: Normal external exam Bladder: Nontender  ASSESSMENT: 1.  History of postmenopausal bleeding-no bleeding within the past 1 year. 2.  Vaginal atrophy.  PLAN: 1.  Follow-up as needed if any episode of postmenopausal bleeding develops. 2.  Patient may continue using Estrace cream 1 g intravaginally weekly as desired.  A total of 15 minutes were spent face-to-face with the patient during this encounter and over half of that time dealt with counseling and coordination of care.  Brayton Mars, MD

## 2017-03-30 DIAGNOSIS — K219 Gastro-esophageal reflux disease without esophagitis: Secondary | ICD-10-CM | POA: Diagnosis not present

## 2017-03-30 DIAGNOSIS — R140 Abdominal distension (gaseous): Secondary | ICD-10-CM | POA: Diagnosis not present

## 2017-03-30 DIAGNOSIS — K581 Irritable bowel syndrome with constipation: Secondary | ICD-10-CM | POA: Diagnosis not present

## 2017-04-07 DIAGNOSIS — G4733 Obstructive sleep apnea (adult) (pediatric): Secondary | ICD-10-CM | POA: Diagnosis not present

## 2017-05-20 DIAGNOSIS — G4733 Obstructive sleep apnea (adult) (pediatric): Secondary | ICD-10-CM | POA: Diagnosis not present

## 2017-05-25 DIAGNOSIS — H353131 Nonexudative age-related macular degeneration, bilateral, early dry stage: Secondary | ICD-10-CM | POA: Diagnosis not present

## 2017-05-25 DIAGNOSIS — H35373 Puckering of macula, bilateral: Secondary | ICD-10-CM | POA: Diagnosis not present

## 2017-06-07 DIAGNOSIS — I10 Essential (primary) hypertension: Secondary | ICD-10-CM | POA: Diagnosis not present

## 2017-06-07 DIAGNOSIS — M150 Primary generalized (osteo)arthritis: Secondary | ICD-10-CM | POA: Diagnosis not present

## 2017-06-07 DIAGNOSIS — R69 Illness, unspecified: Secondary | ICD-10-CM | POA: Diagnosis not present

## 2017-06-07 DIAGNOSIS — N390 Urinary tract infection, site not specified: Secondary | ICD-10-CM | POA: Diagnosis not present

## 2017-06-24 ENCOUNTER — Other Ambulatory Visit: Payer: Self-pay | Admitting: Internal Medicine

## 2017-07-07 ENCOUNTER — Other Ambulatory Visit: Payer: Self-pay | Admitting: Internal Medicine

## 2017-07-14 ENCOUNTER — Ambulatory Visit (INDEPENDENT_AMBULATORY_CARE_PROVIDER_SITE_OTHER): Payer: Medicare HMO

## 2017-07-14 DIAGNOSIS — G4733 Obstructive sleep apnea (adult) (pediatric): Secondary | ICD-10-CM

## 2017-07-19 DIAGNOSIS — B309 Viral conjunctivitis, unspecified: Secondary | ICD-10-CM | POA: Diagnosis not present

## 2017-07-27 DIAGNOSIS — B309 Viral conjunctivitis, unspecified: Secondary | ICD-10-CM | POA: Diagnosis not present

## 2017-08-04 ENCOUNTER — Other Ambulatory Visit: Payer: Self-pay | Admitting: Nurse Practitioner

## 2017-08-04 DIAGNOSIS — F411 Generalized anxiety disorder: Secondary | ICD-10-CM

## 2017-08-04 MED ORDER — CLONAZEPAM 1 MG PO TABS: 1.0000 mg | ORAL_TABLET | Freq: Every day | ORAL | 2 refills | Status: DC

## 2017-08-04 NOTE — Progress Notes (Signed)
Renewed clonazepam 1mg  at bedtime as needed. #30 with 2 refills.

## 2017-08-16 ENCOUNTER — Encounter: Payer: Self-pay | Admitting: Internal Medicine

## 2017-08-16 DIAGNOSIS — M150 Primary generalized (osteo)arthritis: Secondary | ICD-10-CM | POA: Insufficient documentation

## 2017-08-16 DIAGNOSIS — F411 Generalized anxiety disorder: Secondary | ICD-10-CM | POA: Insufficient documentation

## 2017-08-16 DIAGNOSIS — N390 Urinary tract infection, site not specified: Secondary | ICD-10-CM | POA: Insufficient documentation

## 2017-08-17 ENCOUNTER — Ambulatory Visit (INDEPENDENT_AMBULATORY_CARE_PROVIDER_SITE_OTHER): Payer: Medicare HMO | Admitting: Internal Medicine

## 2017-08-17 ENCOUNTER — Encounter: Payer: Self-pay | Admitting: Internal Medicine

## 2017-08-17 VITALS — BP 124/78 | HR 85 | Resp 16 | Ht 63.0 in | Wt 134.6 lb

## 2017-08-17 DIAGNOSIS — M150 Primary generalized (osteo)arthritis: Secondary | ICD-10-CM

## 2017-08-17 DIAGNOSIS — G894 Chronic pain syndrome: Secondary | ICD-10-CM

## 2017-08-17 DIAGNOSIS — R29818 Other symptoms and signs involving the nervous system: Secondary | ICD-10-CM | POA: Diagnosis not present

## 2017-08-17 DIAGNOSIS — R69 Illness, unspecified: Secondary | ICD-10-CM | POA: Diagnosis not present

## 2017-08-17 DIAGNOSIS — R300 Dysuria: Secondary | ICD-10-CM

## 2017-08-17 DIAGNOSIS — F411 Generalized anxiety disorder: Secondary | ICD-10-CM | POA: Diagnosis not present

## 2017-08-17 NOTE — Progress Notes (Signed)
[facility] [place], [place] [address]  Internal MEDICINE  Office Visit Note  Patient Name: Kristen Pennington  [id number]  [id number]  Date of Service: 08/17/2017  Chief Complaint  Patient presents with  . Follow-up    children have things to talk about with dr that they have noticed   HPI Pt is here for routine follow up.  Pt is having hallucinations that there is hacker in her computer She is fixated on it per family, Her son and daughter is in the room  Current Medication: Outpatient Encounter Medications as of 08/17/2017  Medication Sig Note  . benazepril-hydrochlorthiazide (LOTENSIN HCT) 20-25 MG tablet Take by mouth. 04/08/2016: Received from: [place]: Take 1 tablet by mouth once daily.  . Calcium Carb-Cholecalciferol 600-800 MG-UNIT TABS Take by mouth. 04/08/2016: Received from: [place]: Take 1 tablet by mouth once daily.  . clonazePAM (KLONOPIN) 1 MG tablet Take 1 tablet (1 mg total) by mouth at bedtime.   . docusate sodium (COLACE) 50 MG capsule Take 50 mg by mouth daily as needed for mild constipation. Mon, Wed, Fri   . escitalopram (LEXAPRO) 10 MG tablet Take 15 mg by mouth daily. AM   . estradiol (ESTRACE) 0.1 MG/GM vaginal cream Place vaginally as needed.  03/26/2015: Received from: [facility]  . fentaNYL (DURAGESIC - DOSED MCG/HR) 25 MCG/HR patch Place onto the skin.  03/26/2015: Received from: External Pharmacy  . fluticasone (FLONASE) 50 MCG/ACT nasal spray 2 sprays by Each Nare route daily. 03/26/2015: Received from: [facility]  . lubiprostone (AMITIZA) 8 MCG capsule TAKE 1 CAPSULE BY MOUTH TWICE DAILY WITHA MEAL 04/08/2016: Received from: [facility]  . Multiple Vitamin (MULTI-VITAMINS) TABS Take by mouth. 04/08/2016: Received from: [place]: Take 1 tablet by mouth once daily.  Marland Kitchen omeprazole (PRILOSEC) 40 MG capsule Take  40 mg by mouth daily. AM 03/26/2015: Received from: External Pharmacy  . oxybutynin (DITROPAN-XL) 10 MG 24 hr tablet Take 10 mg by mouth at bedtime.   . ranitidine (ZANTAC) 150 MG tablet TAKE ONE TABLET EVERY DAY   . rOPINIRole (REQUIP) 1 MG tablet TAKE ONE TABLET 3 TIMES DAILY AS NEEDED FOR RESTLESS LEGS   . shark liver oil-cocoa butter (PREPARATION H) 0.25-3-85.5 % suppository Place 1 suppository rectally as needed for hemorrhoids. 05/03/2015: PRN  . traMADol-acetaminophen (ULTRACET) 37.5-325 MG per tablet  03/26/2015: Received from: External Pharmacy  . trolamine salicylate (ASPERCREME) 10 % cream Apply topically. 04/08/2016: Received from: [place]: Apply topically as needed.  . dicyclomine (BENTYL) 10 MG capsule Take by mouth.   . [DISCONTINUED] Cholecalciferol (VITAMIN D3 SUPER STRENGTH) 2000 UNITS TABS Take by mouth. 03/26/2015: Received from: [facility]  . [DISCONTINUED] diphenhydramine-acetaminophen (ACETAMINOPHEN PM EX ST) 25-500 MG TABS Take by mouth. 03/26/2015: Received from: [facility]   No facility-administered encounter medications on file as of 08/17/2017.     Surgical History: Past Surgical History:  Procedure Laterality Date  . COLONOSCOPY  05-03-15  . COLONOSCOPY WITH PROPOFOL N/A 05/03/2015   Procedure: COLONOSCOPY WITH PROPOFOL;  Surgeon: Lucilla Lame, MD;  Location: [place];  Service: Endoscopy;  Laterality: N/A;  CPAP  . DILATION AND CURETTAGE OF UTERUS  1970  . ENDOMETRIAL BIOPSY    . EYE SURGERY Right 2017  . POLYPECTOMY  05/03/2015   Procedure: POLYPECTOMY;  Surgeon: Lucilla Lame, MD;  Location: [facility];  Service: Endoscopy;;  . ROTATOR CUFF REPAIR  2007  . SPINE SURGERY      Medical History: Past Medical History:  Diagnosis Date  . Arthritis   . Atrophic vaginitis 03/06/2013   Last Assessment & Plan:  She will plan to continue estradiol vaginal cream.  Prescription was rewritten stipulating use of  the generic product.   . Atypical migraine 03/06/2013   Last Assessment & Plan:  Since she rarely takes sumatriptan, we will discontinue it. Continue sparing use of OTC migraine products.   . Avitaminosis D 03/06/2013   Last Assessment & Plan:  Recheck vitamin D level. Plan to continue vitamin D supplementation.   . Ceratitis 08/01/2013   Last Assessment & Plan:  Because of the expense, she will discontinue Restasis. She will use over-the-counter moisturizing eyedrops and liquid gel.   . Colon polyp   . 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   . 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   . Cystocele, midline 03/06/2013  . Demoralization and apathy 03/06/2013   Last Assessment & Plan:  Since she has been taking bupropion only once a day, I will write the prescription with the correct instructions and correct quantity. She has done well on this for years and she is encouraged to take it regularly   . Depression   . Environmental allergies   . Epistaxis   . Essential (primary) hypertension 05/17/2013   Last Assessment & Plan:  Her blood pressure is well-controlled. We will plan to continue hydrochlorothiazide and benazepril.  Both are generic and should be affordable on her health plan.   Marland Kitchen GERD (gastroesophageal reflux disease)   . Hemorrhoid   . Inflammation of sacroiliac joint ([place]) 03/06/2013   Last Assessment & Plan:  She is doing well on her present regimen of fentanyl patch supplemented with sparing use of tramadol/acetaminophen. We will continue that regimen.   . Sinusitis   . Sleep apnea    CPAP    Family History: Family History  Problem Relation Age of Onset  . Arthritis Mother   . Alzheimer's disease Father   . Heart disease Sister   . Cancer Brother   . Ovarian cancer Neg Hx   . Breast cancer Neg  Hx   . Colon cancer Neg Hx   . Diabetes Neg Hx     Social History   Socioeconomic History  . Marital status: Widowed    Spouse name: Not on file  . Number of children: Not on file  . Years of education: Not on file  . Highest education level: Not on file  Social Needs  . Financial resource strain: Not on file  . Food insecurity - worry: Not on file  . Food insecurity - inability: Not on file  . Transportation needs - medical: Not on file  . Transportation needs - non-medical: Not on file  Occupational History  . Not on file  Tobacco Use  . Smoking status: Never Smoker  . Smokeless tobacco: Never Used  Substance and Sexual Activity  . Alcohol use: No    Alcohol/week: 0.0 oz  . Drug use: No  . Sexual activity: Not Currently  Other Topics Concern  . Not on file  Social History Narrative  . Not on file    Review of Systems  Constitutional: Negative for chills, fatigue and unexpected weight change.  HENT: Positive for postnasal drip. Negative for congestion, rhinorrhea, sneezing and sore throat.   Eyes: Negative for redness.  Respiratory: Negative for cough, chest tightness and shortness of breath.   Cardiovascular: Negative for chest pain and palpitations.  Gastrointestinal: Negative for abdominal pain, constipation, diarrhea, nausea and vomiting.  Genitourinary: Negative for dysuria and frequency.  Musculoskeletal: Negative for arthralgias, back pain, joint swelling and neck pain.  Skin: Negative for rash.  Neurological: Negative.  Negative for tremors and numbness.  Hematological: Negative for adenopathy. Does not bruise/bleed easily.  Psychiatric/Behavioral: Positive for confusion and hallucinations. Negative for behavioral problems (Depression), sleep disturbance and suicidal ideas. The patient is not nervous/anxious.     Vital Signs: BP 124/78 (BP Location: Left Arm, Patient Position: Sitting)   Pulse 85   Resp 16   Ht 5\' 3"  (1.6 m)   Wt 134 lb 9.6 oz (61.1 kg)    SpO2 96%   BMI 23.84 kg/m    Physical Exam  Constitutional: She is oriented to person, place, and time. She appears well-developed and well-nourished. No distress.  HENT:  Head: Normocephalic and atraumatic.  Mouth/Throat: Oropharynx is clear and moist. No oropharyngeal exudate.  Eyes: EOM are normal. Pupils are equal, round, and reactive to light.  Neck: Normal range of motion. Neck supple. No JVD present. No tracheal deviation present. No thyromegaly present.  Cardiovascular: Normal rate, regular rhythm and normal heart sounds. Exam reveals no gallop and no friction rub.  No murmur heard. Pulmonary/Chest: Effort normal. No respiratory distress. She has no wheezes. She has no rales. She exhibits no tenderness.  Abdominal: Soft. Bowel sounds are normal.  Musculoskeletal: Normal range of motion.  Lymphadenopathy:    She has no cervical adenopathy.  Neurological: She is alert and oriented to person, place, and time. No cranial nerve deficit.  Skin: Skin is warm and dry. She is not diaphoretic.  Psychiatric: She has a normal mood and affect. Her behavior is normal. Judgment and thought content normal.  Upset and crying    Assessment/Plan:  General Counseling: srishti strnad understanding of the findings of todays visit and agrees with plan of treatment. I have discussed any further diagnostic evaluation that may be needed or ordered today. We also reviewed her medications today. she has been encouraged to call the office with any questions or concerns that should arise related to todays visit.   1. Other symptoms and signs involving the nervous system - CT Head Wo Contrast; Future - TSH + free T4 - Urinalysis - B12 and Folate Panel  2. Dysuria - Urinalysis, Routine w reflex microscopic  3. Generalized anxiety disorder - Hold Clonazepam for now  4. Primary generalized (osteo)arthritis - Hold tramadol and Durageic patch  5. Chronic pain disorder - Uncontrolled and  dependence on narcotics    Time spent: 27  Minutes   Dr Lavera Guise Internal medicine

## 2017-08-18 LAB — URINALYSIS, ROUTINE W REFLEX MICROSCOPIC
BILIRUBIN UA: NEGATIVE
Glucose, UA: NEGATIVE
Ketones, UA: NEGATIVE
Leukocytes, UA: NEGATIVE
NITRITE UA: NEGATIVE
PH UA: 6 (ref 5.0–7.5)
PROTEIN UA: NEGATIVE
RBC UA: NEGATIVE
Specific Gravity, UA: 1.01 (ref 1.005–1.030)
UUROB: 0.2 mg/dL (ref 0.2–1.0)

## 2017-08-24 DIAGNOSIS — R69 Illness, unspecified: Secondary | ICD-10-CM | POA: Diagnosis not present

## 2017-08-25 ENCOUNTER — Ambulatory Visit: Payer: Medicare HMO

## 2017-08-25 NOTE — Progress Notes (Signed)
95 percentile pressure 6.1   95th percentile leak 8.36     apnea-hypopnea index  2.1 /hr   total days used  >4 hr 74 days  total days used <4 hr 16 days  Total compliance 72.2 percent

## 2017-09-01 ENCOUNTER — Ambulatory Visit
Admission: RE | Admit: 2017-09-01 | Discharge: 2017-09-01 | Disposition: A | Discharge status: Home / Self Care | Payer: Medicare HMO | Source: Ambulatory Visit | Attending: Internal Medicine | Admitting: Internal Medicine

## 2017-09-01 DIAGNOSIS — R29810 Facial weakness: Secondary | ICD-10-CM | POA: Diagnosis not present

## 2017-09-01 DIAGNOSIS — R29818 Other symptoms and signs involving the nervous system: Secondary | ICD-10-CM | POA: Insufficient documentation

## 2017-09-07 ENCOUNTER — Ambulatory Visit (INDEPENDENT_AMBULATORY_CARE_PROVIDER_SITE_OTHER): Payer: Medicare HMO | Admitting: Internal Medicine

## 2017-09-07 ENCOUNTER — Encounter: Payer: Self-pay | Admitting: Internal Medicine

## 2017-09-07 ENCOUNTER — Other Ambulatory Visit: Payer: Self-pay | Admitting: Internal Medicine

## 2017-09-07 VITALS — BP 109/71 | HR 80 | Resp 16 | Ht 64.0 in | Wt 133.2 lb

## 2017-09-07 DIAGNOSIS — F411 Generalized anxiety disorder: Secondary | ICD-10-CM | POA: Diagnosis not present

## 2017-09-07 DIAGNOSIS — M150 Primary generalized (osteo)arthritis: Secondary | ICD-10-CM | POA: Diagnosis not present

## 2017-09-07 DIAGNOSIS — R443 Hallucinations, unspecified: Secondary | ICD-10-CM

## 2017-09-07 DIAGNOSIS — R69 Illness, unspecified: Secondary | ICD-10-CM | POA: Diagnosis not present

## 2017-09-07 DIAGNOSIS — R29818 Other symptoms and signs involving the nervous system: Secondary | ICD-10-CM | POA: Diagnosis not present

## 2017-09-07 DIAGNOSIS — E079 Disorder of thyroid, unspecified: Secondary | ICD-10-CM | POA: Diagnosis not present

## 2017-09-07 DIAGNOSIS — E538 Deficiency of other specified B group vitamins: Secondary | ICD-10-CM | POA: Diagnosis not present

## 2017-09-07 DIAGNOSIS — I10 Essential (primary) hypertension: Secondary | ICD-10-CM

## 2017-09-07 MED ORDER — ESCITALOPRAM OXALATE 20 MG PO TABS: 20.0000 mg | ORAL_TABLET | Freq: Every day | ORAL | 3 refills | Status: DC

## 2017-09-07 MED ORDER — MIRTAZAPINE 15 MG PO TABS: ORAL_TABLET | ORAL | 3 refills | Status: DC

## 2017-09-07 NOTE — Progress Notes (Signed)
[facility] [place], [place] [address]  Internal MEDICINE  Office Visit Note  Patient Name: Kristen Pennington  [id number]  [id number]  Date of Service: 10/07/2017  Chief Complaint  Patient presents with  . Follow-up    medications    HPI  Pt is here for routine follow up. She is here with her son. She is still having hallucinations. Multiple medications were stopped on previous visit. She is however having more anxiety. Her Lexapro was increased on last visit. Contiues to have pain due to OA   Current Medication: Outpatient Encounter Medications as of 09/07/2017  Medication Sig Note  . acetaminophen (TYLENOL) 500 MG tablet Take 500 mg by mouth every 6 (six) hours as needed.   . benazepril-hydrochlorthiazide (LOTENSIN HCT) 20-25 MG tablet Take by mouth. 04/08/2016: Received from: [place]: Take 1 tablet by mouth once daily.  . Calcium Carb-Cholecalciferol 600-800 MG-UNIT TABS Take by mouth. 04/08/2016: Received from: [place]: Take 1 tablet by mouth once daily.  . Carboxymethylcellulose Sodium (LUBRICANT EYE DROPS OP) Apply to eye.   . Carboxymethylcellulose Sodium (THERATEARS OP) Apply to eye.   . Cholecalciferol (VITAMIN D3) 5000 units TABS Take 5,000 Units by mouth daily.   Marland Kitchen escitalopram (LEXAPRO) 20 MG tablet Take 1 tablet (20 mg total) by mouth daily. AM   . estradiol (ESTRACE) 0.1 MG/GM vaginal cream Place vaginally as needed.  03/26/2015: Received from: [facility]  . fluticasone (FLONASE) 50 MCG/ACT nasal spray 2 sprays by Each Nare route daily. 03/26/2015: Received from: [facility]  . Liniments (SALONPAS PAIN RELIEF PATCH EX) Apply 1 patch topically 2 (two) times daily.   Marland Kitchen lubiprostone (AMITIZA) 8 MCG capsule TAKE 1 CAPSULE BY MOUTH TWICE DAILY WITHA MEAL 04/08/2016: Received from: [facility]  . Multiple Vitamin (MULTI-VITAMINS) TABS Take by mouth. 04/08/2016:  Received from: [place]: Take 1 tablet by mouth once daily.  . ranitidine (ZANTAC) 150 MG tablet TAKE ONE TABLET EVERY DAY   . shark liver oil-cocoa butter (PREPARATION H) 0.25-3-85.5 % suppository Place 1 suppository rectally as needed for hemorrhoids. 05/03/2015: PRN  . trolamine salicylate (ASPERCREME) 10 % cream Apply topically. 04/08/2016: Received from: [place]: Apply topically as needed.  . [DISCONTINUED] escitalopram (LEXAPRO) 10 MG tablet Take 20 mg by mouth daily. AM   . [DISCONTINUED] omeprazole (PRILOSEC) 40 MG capsule Take 40 mg by mouth daily. AM 03/26/2015: Received from: External Pharmacy  . [DISCONTINUED] oxybutynin (DITROPAN-XL) 10 MG 24 hr tablet Take 10 mg by mouth at bedtime.   . [DISCONTINUED] rOPINIRole (REQUIP) 1 MG tablet TAKE ONE TABLET 3 TIMES DAILY AS NEEDED FOR RESTLESS LEGS   . clonazePAM (KLONOPIN) 1 MG tablet Take 1 tablet (1 mg total) by mouth at bedtime. (Patient not taking: Reported on 09/07/2017)   . dicyclomine (BENTYL) 10 MG capsule Take by mouth.   . docusate sodium (COLACE) 50 MG capsule Take 50 mg by mouth daily as needed for mild constipation. Mon, Wed, Fri   . fentaNYL ([place] - DOSED MCG/HR) 25 MCG/HR patch Place onto the skin.  03/26/2015: Received from: External Pharmacy  . traMADol-acetaminophen (ULTRACET) 37.5-325 MG per tablet  03/26/2015: Received from: External Pharmacy  . [DISCONTINUED] mirtazapine (REMERON) 15 MG tablet Take one tab po qhs for sleep    No facility-administered encounter medications on file as of 09/07/2017.     Surgical History: Past  Surgical History:  Procedure Laterality Date  . COLONOSCOPY  05-03-15  . COLONOSCOPY WITH PROPOFOL N/A 05/03/2015   Procedure: COLONOSCOPY WITH PROPOFOL;  Surgeon: Lucilla Lame, MD;  Location: [place];  Service: Endoscopy;  Laterality: N/A;  CPAP  . DILATION AND CURETTAGE OF UTERUS  1970  . ENDOMETRIAL BIOPSY    .  EYE SURGERY Right 2017  . POLYPECTOMY  05/03/2015   Procedure: POLYPECTOMY;  Surgeon: Lucilla Lame, MD;  Location: [place];  Service: Endoscopy;;  . ROTATOR CUFF REPAIR  2007  . SPINE SURGERY      Medical History: Past Medical History:  Diagnosis Date  . Arthritis   . Atrophic vaginitis 03/06/2013   Last Assessment & Plan:  She will plan to continue estradiol vaginal cream.  Prescription was rewritten stipulating use of the generic product.   . Atypical migraine 03/06/2013   Last Assessment & Plan:  Since she rarely takes sumatriptan, we will discontinue it. Continue sparing use of OTC migraine products.   . Avitaminosis D 03/06/2013   Last Assessment & Plan:  Recheck vitamin D level. Plan to continue vitamin D supplementation.   . Ceratitis 08/01/2013   Last Assessment & Plan:  Because of the expense, she will discontinue Restasis. She will use over-the-counter moisturizing eyedrops and liquid gel.   . Colon polyp   . 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   . 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   . Cystocele, midline 03/06/2013  . Demoralization and apathy 03/06/2013   Last Assessment & Plan:  Since she has been taking bupropion only once a day, I will write the prescription with the correct instructions and correct quantity. She has done well on this for years and she is encouraged to take it regularly   . Depression   . Environmental allergies   . Epistaxis   . Essential (primary) hypertension 05/17/2013   Last Assessment & Plan:  Her blood pressure is well-controlled. We will plan to continue hydrochlorothiazide and benazepril.  Both are generic and should be affordable on her health plan.   Marland Kitchen GERD (gastroesophageal reflux disease)   . Hemorrhoid   . Inflammation of sacroiliac joint ([place])  03/06/2013   Last Assessment & Plan:  She is doing well on her present regimen of fentanyl patch supplemented with sparing use of tramadol/acetaminophen. We will continue that regimen.   . Sinusitis   . Sleep apnea    CPAP    Family History: Family History  Problem Relation Age of Onset  . Arthritis Mother   . Alzheimer's disease Father   . Heart disease Sister   . Cancer Brother   . Ovarian cancer Neg Hx   . Breast cancer Neg Hx   . Colon cancer Neg Hx   . Diabetes Neg Hx     Social History   Socioeconomic History  . Marital status: Widowed    Spouse name: Not on file  . Number of children: Not on file  . Years of education: Not on file  . Highest education level: Not on file  Occupational History  . Not on file  Social Needs  . Financial resource strain: Not on file  . Food insecurity:    Worry: Not on file    Inability: Not on file  . Transportation needs:  Medical: Not on file    Non-medical: Not on file  Tobacco Use  . Smoking status: Never Smoker  . Smokeless tobacco: Never Used  Substance and Sexual Activity  . Alcohol use: No    Alcohol/week: 0.0 oz  . Drug use: No  . Sexual activity: Not Currently  Lifestyle  . Physical activity:    Days per week: Not on file    Minutes per session: Not on file  . Stress: Not on file  Relationships  . Social connections:    Talks on phone: Not on file    Gets together: Not on file    Attends religious service: Not on file    Active member of club or organization: Not on file    Attends meetings of clubs or organizations: Not on file    Relationship status: Not on file  . Intimate partner violence:    Fear of current or ex partner: Not on file    Emotionally abused: Not on file    Physically abused: Not on file    Forced sexual activity: Not on file  Other Topics Concern  . Not on file  Social History Narrative  . Not on file      Review of Systems  Constitutional: Negative for chills, fatigue and  unexpected weight change.  HENT: Negative for congestion, postnasal drip, rhinorrhea, sneezing and sore throat.   Eyes: Negative for redness.  Respiratory: Negative for cough, chest tightness and shortness of breath.   Cardiovascular: Negative for chest pain and palpitations.  Gastrointestinal: Negative for abdominal pain, constipation, diarrhea, nausea and vomiting.  Genitourinary: Negative for dysuria and frequency.  Musculoskeletal: Positive for arthralgias. Negative for back pain, joint swelling and neck pain.  Skin: Negative for rash.  Neurological: Positive for tremors. Negative for numbness.  Hematological: Negative for adenopathy. Does not bruise/bleed easily.  Psychiatric/Behavioral: Positive for behavioral problems (Depression) and self-injury. Negative for sleep disturbance and suicidal ideas. The patient is nervous/anxious.     Vital Signs: BP 109/71 (BP Location: Left Arm, Patient Position: Sitting)   Pulse 80   Resp 16   Ht 5\' 4"  (1.626 m)   Wt 133 lb 3.2 oz (60.4 kg)   SpO2 99%   BMI 22.86 kg/m    Physical Exam  Constitutional: She is oriented to person, place, and time. She appears well-developed and well-nourished. No distress.  HENT:  Head: Normocephalic and atraumatic.  Mouth/Throat: Oropharynx is clear and moist. No oropharyngeal exudate.  Eyes: Pupils are equal, round, and reactive to light. EOM are normal.  Neck: Normal range of motion. Neck supple. No JVD present. No tracheal deviation present. No thyromegaly present.  Cardiovascular: Normal rate, regular rhythm and normal heart sounds. Exam reveals no gallop and no friction rub.  No murmur heard. Pulmonary/Chest: Effort normal. No respiratory distress. She has no wheezes. She has no rales. She exhibits no tenderness.  Abdominal: Soft. Bowel sounds are normal.  Musculoskeletal: Normal range of motion.  Lymphadenopathy:    She has no cervical adenopathy.  Neurological: She is alert and oriented to person,  place, and time. A cranial nerve deficit is present.  Skin: Skin is warm and dry. She is not diaphoretic.  Psychiatric: She has a normal mood and affect. Her behavior is normal. Judgment and thought content normal.   Assessment/Plan: 1. Primary generalized (osteo)arthritis - Avoid Duragesic patch and tramadol, might want to introduce one at a time if pain is uncontrolled  2. Generalized anxiety disorder - escitalopram (LEXAPRO)  20 MG tablet; Take 1 tablet (20 mg total) by mouth daily. AM  Dispense: 45 tablet; Refill: 3 - Increase as tolerated and continue Klonopin prn, trial of Remeron  3. Essential hypertension, benign - BP is low today, pt was instructed to monitor at home   4. Hallucinations - Pt is oriented x 3. Son was instructed to remove her computer briefly from her house   General Counseling: alessandria henken understanding of the findings of todays visit and agrees with plan of treatment. I have discussed any further diagnostic evaluation that may be needed or ordered today. We also reviewed her medications today. she has been encouraged to call the office with any questions or concerns that should arise related to todays visit.   Meds ordered this encounter  Medications  . escitalopram (LEXAPRO) 20 MG tablet    Sig: Take 1 tablet (20 mg total) by mouth daily. AM    Dispense:  45 tablet    Refill:  3  . DISCONTD: mirtazapine (REMERON) 15 MG tablet    Sig: Take one tab po qhs for sleep    Dispense:  30 tablet    Refill:  3    Time spent:25 Minutes   Dr Lavera Guise Internal medicine

## 2017-09-08 LAB — B12 AND FOLATE PANEL
Folate: >20 ng/mL (ref >3.0)
VITAMIN B 12: 670 pg/mL (ref 232–1245)

## 2017-09-08 LAB — TSH+FREE T4
FREE T4: 1.13 ng/dL (ref 0.82–1.77)
TSH: 1.73 u[IU]/mL (ref 0.450–4.500)

## 2017-09-08 LAB — URINALYSIS
BILIRUBIN UA: NEGATIVE
GLUCOSE, UA: NEGATIVE
Nitrite, UA: NEGATIVE
PH UA: 5 (ref 5.0–7.5)
PROTEIN UA: NEGATIVE
RBC UA: NEGATIVE
Specific Gravity, UA: 1.026 (ref 1.005–1.030)
Urobilinogen, Ur: 0.2 mg/dL (ref 0.2–1.0)

## 2017-09-28 DIAGNOSIS — K219 Gastro-esophageal reflux disease without esophagitis: Secondary | ICD-10-CM | POA: Diagnosis not present

## 2017-09-28 DIAGNOSIS — K581 Irritable bowel syndrome with constipation: Secondary | ICD-10-CM | POA: Diagnosis not present

## 2017-09-28 DIAGNOSIS — R140 Abdominal distension (gaseous): Secondary | ICD-10-CM | POA: Diagnosis not present

## 2017-10-02 ENCOUNTER — Other Ambulatory Visit: Payer: Self-pay | Admitting: Internal Medicine

## 2017-10-04 ENCOUNTER — Other Ambulatory Visit: Payer: Self-pay

## 2017-10-04 MED ORDER — ROPINIROLE HCL 1 MG PO TABS: ORAL_TABLET | ORAL | 3 refills | Status: DC

## 2017-10-05 ENCOUNTER — Ambulatory Visit (INDEPENDENT_AMBULATORY_CARE_PROVIDER_SITE_OTHER): Payer: Medicare HMO | Admitting: Internal Medicine

## 2017-10-05 ENCOUNTER — Encounter: Payer: Self-pay | Admitting: Internal Medicine

## 2017-10-05 VITALS — BP 198/92 | HR 79 | Resp 16 | Ht 64.0 in | Wt 132.4 lb

## 2017-10-05 DIAGNOSIS — R69 Illness, unspecified: Secondary | ICD-10-CM | POA: Diagnosis not present

## 2017-10-05 DIAGNOSIS — F411 Generalized anxiety disorder: Secondary | ICD-10-CM

## 2017-10-05 DIAGNOSIS — I10 Essential (primary) hypertension: Secondary | ICD-10-CM

## 2017-10-05 DIAGNOSIS — M150 Primary generalized (osteo)arthritis: Secondary | ICD-10-CM

## 2017-10-05 MED ORDER — ATENOLOL 25 MG PO TABS: 25.0000 mg | ORAL_TABLET | Freq: Every day | ORAL | 3 refills | Status: DC

## 2017-10-05 NOTE — Progress Notes (Signed)
[facility] [place], [place] [address]  Internal MEDICINE  Office Visit Note  Patient Name: Kristen Pennington  [id number]  [id number]  Date of Service: 10/05/2017  Chief Complaint  Patient presents with  . Hypertension  . Hallucinations    HPI  Pt is here for routine follow up. She is feeling better, however she is alone today and I am not sure if her history is reliable at this point, she does c/o cough for few days.She is off the Duragesic patch and Tramadol. Denies having any hallucinations, Family took her computer. Bp is elevated  Current Medication: Outpatient Encounter Medications as of 10/05/2017  Medication Sig Note  . acetaminophen (TYLENOL) 500 MG tablet Take 500 mg by mouth every 6 (six) hours as needed.   Marland Kitchen atenolol (TENORMIN) 25 MG tablet Take 1 tablet (25 mg total) by mouth daily. For bp   . benazepril-hydrochlorthiazide (LOTENSIN HCT) 20-12.5 MG tablet TAKE ONE TABLET EVERY MORNING FOR BLOOD PRESSURE   . benazepril-hydrochlorthiazide (LOTENSIN HCT) 20-25 MG tablet Take by mouth. 04/08/2016: Received from: [facility]: Take 1 tablet by mouth once daily.  . Calcium Carb-Cholecalciferol 600-800 MG-UNIT TABS Take by mouth. 04/08/2016: Received from: [facility]: Take 1 tablet by mouth once daily.  . Carboxymethylcellulose Sodium (LUBRICANT EYE DROPS OP) Apply to eye.   . Carboxymethylcellulose Sodium (THERATEARS OP) Apply to eye.   . Cholecalciferol (VITAMIN D3) 5000 units TABS Take 5,000 Units by mouth daily.   . clonazePAM (KLONOPIN) 1 MG tablet Take 1 tablet (1 mg total) by mouth at bedtime. (Patient not taking: Reported on 09/07/2017)   . dicyclomine (BENTYL) 10 MG capsule Take by mouth.   . docusate sodium (COLACE) 50 MG capsule Take 50 mg by mouth daily as needed for mild constipation. Mon, Wed, Fri   . escitalopram (LEXAPRO) 20 MG tablet Take 1 tablet (20 mg total) by mouth daily. AM    . estradiol (ESTRACE) 0.1 MG/GM vaginal cream Place vaginally as needed.  03/26/2015: Received from: [facility]  . fentaNYL (DURAGESIC - DOSED MCG/HR) 25 MCG/HR patch Place onto the skin.  03/26/2015: Received from: External Pharmacy  . fluticasone (FLONASE) 50 MCG/ACT nasal spray 2 sprays by Each Nare route daily. 03/26/2015: Received from: [facility]  . Liniments (SALONPAS PAIN RELIEF PATCH EX) Apply 1 patch topically 2 (two) times daily.   Marland Kitchen lubiprostone (AMITIZA) 8 MCG capsule TAKE 1 CAPSULE BY MOUTH TWICE DAILY WITHA MEAL 04/08/2016: Received from: [facility]  . mirtazapine (REMERON) 15 MG tablet TAKE ONE TABLET AT BEDTIME AS NEEDED FORSLEEP   . Multiple Vitamin (MULTI-VITAMINS) TABS Take by mouth. 04/08/2016: Received from: [facility]: Take 1 tablet by mouth once daily.  Marland Kitchen omeprazole (PRILOSEC) 40 MG capsule TAKE 1 CAPSULE BY MOUTH DAILY   . oxybutynin (DITROPAN-XL) 10 MG 24 hr tablet TAKE ONE TABLET EVERY DAY   . ranitidine (ZANTAC) 150 MG tablet TAKE ONE TABLET EVERY DAY   . rOPINIRole (REQUIP) 1 MG tablet TAKE ONE TABLET 3 TIMES DAILY AS NEEDED FOR RESTLESS LEGS   . shark liver oil-cocoa butter (PREPARATION H) 0.25-3-85.5 % suppository Place 1 suppository rectally as needed for hemorrhoids. 05/03/2015: PRN  . traMADol-acetaminophen (ULTRACET) 37.5-325 MG per tablet  03/26/2015: Received from: External Pharmacy  . trolamine salicylate (ASPERCREME) 10 % cream Apply topically. 04/08/2016: Received from: [facility]: Apply topically as needed.  No facility-administered encounter medications on file as of 10/05/2017.     Surgical History: Past Surgical History:  Procedure Laterality Date  . COLONOSCOPY  05-03-15  . COLONOSCOPY WITH PROPOFOL N/A 05/03/2015   Procedure: COLONOSCOPY WITH PROPOFOL;  Surgeon: Lucilla Lame, MD;  Location: [facility];  Service: Endoscopy;  Laterality: N/A;  CPAP  .  DILATION AND CURETTAGE OF UTERUS  1970  . ENDOMETRIAL BIOPSY    . EYE SURGERY Right 2017  . POLYPECTOMY  05/03/2015   Procedure: POLYPECTOMY;  Surgeon: Lucilla Lame, MD;  Location: [facility];  Service: Endoscopy;;  . ROTATOR CUFF REPAIR  2007  . SPINE SURGERY      Medical History: Past Medical History:  Diagnosis Date  . Arthritis   . Atrophic vaginitis 03/06/2013   Last Assessment & Plan:  She will plan to continue estradiol vaginal cream.  Prescription was rewritten stipulating use of the generic product.   . Atypical migraine 03/06/2013   Last Assessment & Plan:  Since she rarely takes sumatriptan, we will discontinue it. Continue sparing use of OTC migraine products.   . Avitaminosis D 03/06/2013   Last Assessment & Plan:  Recheck vitamin D level. Plan to continue vitamin D supplementation.   . Ceratitis 08/01/2013   Last Assessment & Plan:  Because of the expense, she will discontinue Restasis. She will use over-the-counter moisturizing eyedrops and liquid gel.   . Colon polyp   . 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   . 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   . Cystocele, midline 03/06/2013  . Demoralization and apathy 03/06/2013   Last Assessment & Plan:  Since she has been taking bupropion only once a day, I will write the prescription with the correct instructions and correct quantity. She has done well on this for years and she is encouraged to take it regularly   . Depression   . Environmental allergies   . Epistaxis   . Essential (primary) hypertension 05/17/2013   Last Assessment & Plan:  Her blood pressure is well-controlled. We will plan to continue hydrochlorothiazide and benazepril.  Both are generic and should be affordable on her health plan.   Marland Kitchen GERD (gastroesophageal reflux  disease)   . Hemorrhoid   . Inflammation of sacroiliac joint ([place]) 03/06/2013   Last Assessment & Plan:  She is doing well on her present regimen of fentanyl patch supplemented with sparing use of tramadol/acetaminophen. We will continue that regimen.   . Sinusitis   . Sleep apnea    CPAP    Family History: Family History  Problem Relation Age of Onset  . Arthritis Mother   . Alzheimer's disease Father   . Heart disease Sister   . Cancer Brother   . Ovarian cancer Neg Hx   . Breast cancer Neg Hx   . Colon cancer Neg Hx   . Diabetes Neg Hx     Social History   Socioeconomic History  . Marital status: Widowed    Spouse name: Not on file  . Number of children: Not on file  . Years of education: Not on file  . Highest education level: Not on file  Occupational History  . Not on file  Social Needs  . Financial resource strain: Not on file  . Food insecurity:  Worry: Not on file    Inability: Not on file  . Transportation needs:    Medical: Not on file    Non-medical: Not on file  Tobacco Use  . Smoking status: Never Smoker  . Smokeless tobacco: Never Used  Substance and Sexual Activity  . Alcohol use: No    Alcohol/week: 0.0 oz  . Drug use: No  . Sexual activity: Not Currently  Lifestyle  . Physical activity:    Days per week: Not on file    Minutes per session: Not on file  . Stress: Not on file  Relationships  . Social connections:    Talks on phone: Not on file    Gets together: Not on file    Attends religious service: Not on file    Active member of club or organization: Not on file    Attends meetings of clubs or organizations: Not on file    Relationship status: Not on file  . Intimate partner violence:    Fear of current or ex partner: Not on file    Emotionally abused: Not on file    Physically abused: Not on file    Forced sexual activity: Not on file  Other Topics Concern  . Not on file  Social History Narrative  . Not on file     Review  of Systems  Constitutional: Negative for chills, diaphoresis and fatigue.  HENT: Negative for ear pain, postnasal drip and sinus pressure.   Eyes: Negative for photophobia, discharge, redness, itching and visual disturbance.  Respiratory: Negative for cough, shortness of breath and wheezing.   Cardiovascular: Negative for chest pain, palpitations and leg swelling.  Gastrointestinal: Negative for abdominal pain, constipation, diarrhea, nausea and vomiting.  Genitourinary: Negative for dysuria and flank pain.  Musculoskeletal: Negative for arthralgias, back pain, gait problem and neck pain.  Skin: Negative for color change.  Allergic/Immunologic: Negative for environmental allergies and food allergies.  Neurological: Negative for dizziness and headaches.  Hematological: Does not bruise/bleed easily.  Psychiatric/Behavioral: Negative for agitation, behavioral problems (depression) and hallucinations.    Vital Signs: BP (!) 198/92 (BP Location: Left Arm, Patient Position: Sitting) Comment: pt anxious and didn't take Bp medication this morning.  dbs  Pulse 79   Resp 16   Ht 5\' 4"  (1.626 m)   Wt 132 lb 6.4 oz (60.1 kg)   SpO2 96%   BMI 22.73 kg/m    Physical Exam  Constitutional: She is oriented to person, place, and time. She appears well-developed and well-nourished. No distress.  HENT:  Head: Normocephalic and atraumatic.  Mouth/Throat: Oropharynx is clear and moist. No oropharyngeal exudate.  Eyes: Pupils are equal, round, and reactive to light. EOM are normal.  Neck: Normal range of motion. Neck supple. No JVD present. No tracheal deviation present. No thyromegaly present.  Cardiovascular: Normal rate, regular rhythm and normal heart sounds. Exam reveals no gallop and no friction rub.  No murmur heard. Pulmonary/Chest: Effort normal. No respiratory distress. She has no wheezes. She has no rales. She exhibits no tenderness.  Abdominal: Soft. Bowel sounds are normal.   Musculoskeletal: Normal range of motion.  Lymphadenopathy:    She has no cervical adenopathy.  Neurological: She is alert and oriented to person, place, and time. No cranial nerve deficit.  Skin: Skin is warm and dry. She is not diaphoretic.  Psychiatric: She has a normal mood and affect. Her behavior is normal. Judgment and thought content normal.   Assessment/Plan: 1. Primary generalized (osteo)arthritis -  Continue Tylenol prn  2. Generalized anxiety disorder - Continue Lexapro and remeron with prn Klonopin. Family is not with her so cannot confirm this  3. Systemic primary arterial hypertension - Add atenolol and continue other meds  - General Counseling: tanara turvey understanding of the findings of todays visit and agrees with plan of treatment. I have discussed any further diagnostic evaluation that may be needed or ordered today. We also reviewed her medications today. she has been encouraged to call the office with any questions or concerns that should arise related to todays visit.   Meds ordered this encounter  Medications  . atenolol (TENORMIN) 25 MG tablet    Sig: Take 1 tablet (25 mg total) by mouth daily. For bp    Dispense:  90 tablet    Refill:  3    Time spent:25 Minutes   Dr Lavera Guise Internal medicine

## 2017-10-19 ENCOUNTER — Other Ambulatory Visit: Payer: Self-pay

## 2017-10-19 MED ORDER — AZITHROMYCIN 250 MG PO TABS: ORAL_TABLET | ORAL | 0 refills | Status: DC

## 2017-10-19 NOTE — Telephone Encounter (Signed)
Pt called having dry cough and allergy symptoms as per dr Humphrey Rolls send zpak and advised take OTC mucinex or delsym for cough

## 2017-10-26 ENCOUNTER — Telehealth: Payer: Self-pay

## 2017-10-26 NOTE — Telephone Encounter (Signed)
Note sent to provider 

## 2017-10-28 ENCOUNTER — Other Ambulatory Visit: Payer: Self-pay

## 2017-10-28 DIAGNOSIS — F411 Generalized anxiety disorder: Secondary | ICD-10-CM

## 2017-10-28 MED ORDER — CLONAZEPAM 1 MG PO TABS: ORAL_TABLET | ORAL | 2 refills | Status: DC

## 2017-11-01 ENCOUNTER — Other Ambulatory Visit: Payer: Self-pay | Admitting: Internal Medicine

## 2017-11-19 DIAGNOSIS — F419 Anxiety disorder, unspecified: Secondary | ICD-10-CM | POA: Diagnosis not present

## 2017-11-19 DIAGNOSIS — K219 Gastro-esophageal reflux disease without esophagitis: Secondary | ICD-10-CM | POA: Diagnosis not present

## 2017-11-19 DIAGNOSIS — G8929 Other chronic pain: Secondary | ICD-10-CM | POA: Diagnosis not present

## 2017-11-19 DIAGNOSIS — G2581 Restless legs syndrome: Secondary | ICD-10-CM | POA: Diagnosis not present

## 2017-11-19 DIAGNOSIS — H04129 Dry eye syndrome of unspecified lacrimal gland: Secondary | ICD-10-CM | POA: Diagnosis not present

## 2017-11-19 DIAGNOSIS — J309 Allergic rhinitis, unspecified: Secondary | ICD-10-CM | POA: Diagnosis not present

## 2017-11-19 DIAGNOSIS — F329 Major depressive disorder, single episode, unspecified: Secondary | ICD-10-CM | POA: Diagnosis not present

## 2017-11-19 DIAGNOSIS — I10 Essential (primary) hypertension: Secondary | ICD-10-CM | POA: Diagnosis not present

## 2017-11-19 DIAGNOSIS — R69 Illness, unspecified: Secondary | ICD-10-CM | POA: Diagnosis not present

## 2017-11-19 DIAGNOSIS — F4321 Adjustment disorder with depressed mood: Secondary | ICD-10-CM | POA: Diagnosis not present

## 2017-12-21 ENCOUNTER — Telehealth: Payer: Self-pay

## 2017-12-21 NOTE — Telephone Encounter (Signed)
Note sent to dr 

## 2018-01-05 ENCOUNTER — Encounter: Payer: Self-pay | Admitting: Internal Medicine

## 2018-01-05 ENCOUNTER — Telehealth: Payer: Self-pay

## 2018-01-05 ENCOUNTER — Ambulatory Visit (INDEPENDENT_AMBULATORY_CARE_PROVIDER_SITE_OTHER): Payer: Medicare HMO | Admitting: Internal Medicine

## 2018-01-05 VITALS — BP 128/61 | HR 67 | Resp 16 | Ht 64.0 in | Wt 137.2 lb

## 2018-01-05 DIAGNOSIS — I10 Essential (primary) hypertension: Secondary | ICD-10-CM

## 2018-01-05 DIAGNOSIS — M150 Primary generalized (osteo)arthritis: Secondary | ICD-10-CM | POA: Diagnosis not present

## 2018-01-05 DIAGNOSIS — R05 Cough: Secondary | ICD-10-CM

## 2018-01-05 DIAGNOSIS — F411 Generalized anxiety disorder: Secondary | ICD-10-CM | POA: Diagnosis not present

## 2018-01-05 DIAGNOSIS — J301 Allergic rhinitis due to pollen: Secondary | ICD-10-CM

## 2018-01-05 DIAGNOSIS — R443 Hallucinations, unspecified: Secondary | ICD-10-CM | POA: Diagnosis not present

## 2018-01-05 DIAGNOSIS — R69 Illness, unspecified: Secondary | ICD-10-CM | POA: Diagnosis not present

## 2018-01-05 DIAGNOSIS — R058 Other specified cough: Secondary | ICD-10-CM

## 2018-01-05 DIAGNOSIS — T464X5A Adverse effect of angiotensin-converting-enzyme inhibitors, initial encounter: Principal | ICD-10-CM

## 2018-01-05 MED ORDER — BISOPROLOL-HYDROCHLOROTHIAZIDE 2.5-6.25 MG PO TABS: ORAL_TABLET | ORAL | 12 refills | Status: DC

## 2018-01-05 MED ORDER — ESCITALOPRAM OXALATE 20 MG PO TABS: ORAL_TABLET | ORAL | 12 refills | Status: DC

## 2018-01-05 NOTE — Progress Notes (Signed)
[facility] [place], [place] [address]  Internal MEDICINE  Office Visit Note  Patient Name: Kristen Pennington  [id number]  [id number]  Date of Service: 01/24/2018  Chief Complaint  Patient presents with  . Follow-up    discuss medications, been coughing  . Hypertension  . Osteoarthritis  . Anxiety  . Depression   HPI Pt is here for routine follow up. Pt is here alone in the room, she has been hallucinating and confiused in the past. She was asked to stop her bp meds ( ace-inh) due to cough. Her son and daughter has been to the office twice Other medical problems remain in chronic stable condition. Pt still thinks there was a man in her computer   Current Medication: Outpatient Encounter Medications as of 01/05/2018  Medication Sig Note  . acetaminophen (TYLENOL) 500 MG tablet Take 500 mg by mouth every 6 (six) hours as needed.   . Carboxymethylcellulose Sodium (THERATEARS OP) Apply to eye.   . Cholecalciferol (VITAMIN D3) 5000 units TABS Take 5,000 Units by mouth daily.   . clonazePAM (KLONOPIN) 1 MG tablet Take 1/2 tablet PO three times a day for anxiety   . dicyclomine (BENTYL) 10 MG capsule Take 10 mg by mouth 4 (four) times daily -  before meals and at bedtime.   . docusate sodium (COLACE) 50 MG capsule Take 50 mg by mouth daily as needed for mild constipation. Mon, Wed, Fri   . escitalopram (LEXAPRO) 20 MG tablet Take 2 tabs every day   . estradiol (ESTRACE) 0.1 MG/GM vaginal cream Place vaginally as needed.  03/26/2015: Received from: [facility]  . fluticasone (FLONASE) 50 MCG/ACT nasal spray 2 sprays by Each Nare route daily. 03/26/2015: Received from: [facility]  . Liniments (SALONPAS PAIN RELIEF PATCH EX) Apply 1 patch topically 2 (two) times daily.   Marland Kitchen lubiprostone (AMITIZA) 8 MCG capsule TAKE 1 CAPSULE BY MOUTH TWICE DAILY WITHA MEAL 04/08/2016: Received from: [facility]  . mirtazapine (REMERON) 15 MG tablet TAKE ONE  TABLET BY MOUTH AS NEEDED FOR SLEEP   . Multiple Vitamins-Minerals (CENTRUM SILVER 50+WOMEN) TABS Take by mouth.   Marland Kitchen omeprazole (PRILOSEC) 40 MG capsule TAKE 1 CAPSULE BY MOUTH DAILY   . oxybutynin (DITROPAN-XL) 10 MG 24 hr tablet TAKE ONE TABLET EVERY DAY   . ranitidine (ZANTAC) 150 MG tablet TAKE ONE TABLET EVERY DAY   . rOPINIRole (REQUIP) 1 MG tablet TAKE ONE TABLET 3 TIMES DAILY AS NEEDED FOR RESTLESS LEGS   . [DISCONTINUED] benazepril-hydrochlorthiazide (LOTENSIN HCT) 20-12.5 MG tablet TAKE ONE TABLET EVERY MORNING FOR BLOOD PRESSURE   . [DISCONTINUED] escitalopram (LEXAPRO) 20 MG tablet Take 1 tablet (20 mg total) by mouth daily. AM   . atenolol (TENORMIN) 25 MG tablet Take 1 tablet (25 mg total) by mouth daily. For bp   . azithromycin (ZITHROMAX Z-PAK) 250 MG tablet Use as directed for 5 days (Patient not taking: Reported on 01/05/2018)   . bisoprolol-hydrochlorothiazide (ZIAC) 2.5-6.25 MG tablet Take one tab po qd for bp   . fentaNYL (DURAGESIC - DOSED MCG/HR) 25 MCG/HR patch Place onto the skin.  03/26/2015: Received from: External Pharmacy  . Multiple Vitamin (MULTI-VITAMINS) TABS Take by mouth. 04/08/2016: Received from: [facility]: Take 1 tablet by mouth once daily.  . shark liver oil-cocoa butter (PREPARATION H) 0.25-3-85.5 % suppository Place 1 suppository rectally as needed for hemorrhoids. 05/03/2015: PRN  . traMADol-acetaminophen (ULTRACET) 37.5-325 MG per  tablet  03/26/2015: Received from: External Pharmacy  . trolamine salicylate (ASPERCREME) 10 % cream Apply topically. 04/08/2016: Received from: [facility]: Apply topically as needed.  . [DISCONTINUED] benazepril-hydrochlorthiazide (LOTENSIN HCT) 20-25 MG tablet Take by mouth. 04/08/2016: Received from: [place]: Take 1 tablet by mouth once daily.  . [DISCONTINUED] Calcium Carb-Cholecalciferol 600-800 MG-UNIT TABS Take by mouth. 04/08/2016:  Received from: [facility]: Take 1 tablet by mouth once daily.  . [DISCONTINUED] Carboxymethylcellulose Sodium (LUBRICANT EYE DROPS OP) Apply to eye.   . [DISCONTINUED] dicyclomine (BENTYL) 10 MG capsule Take 10 mg by mouth.     No facility-administered encounter medications on file as of 01/05/2018.     Surgical History: Past Surgical History:  Procedure Laterality Date  . COLONOSCOPY  05-03-15  . COLONOSCOPY WITH PROPOFOL N/A 05/03/2015   Procedure: COLONOSCOPY WITH PROPOFOL;  Surgeon: Lucilla Lame, MD;  Location: [facility];  Service: Endoscopy;  Laterality: N/A;  CPAP  . DILATION AND CURETTAGE OF UTERUS  1970  . ENDOMETRIAL BIOPSY    . EYE SURGERY Right 2017  . POLYPECTOMY  05/03/2015   Procedure: POLYPECTOMY;  Surgeon: Lucilla Lame, MD;  Location: [facility];  Service: Endoscopy;;  . ROTATOR CUFF REPAIR  2007  . SPINE SURGERY      Medical History: Past Medical History:  Diagnosis Date  . Arthritis   . Atrophic vaginitis 03/06/2013   Last Assessment & Plan:  She will plan to continue estradiol vaginal cream.  Prescription was rewritten stipulating use of the generic product.   . Atypical migraine 03/06/2013   Last Assessment & Plan:  Since she rarely takes sumatriptan, we will discontinue it. Continue sparing use of OTC migraine products.   . Avitaminosis D 03/06/2013   Last Assessment & Plan:  Recheck vitamin D level. Plan to continue vitamin D supplementation.   . Ceratitis 08/01/2013   Last Assessment & Plan:  Because of the expense, she will discontinue Restasis. She will use over-the-counter moisturizing eyedrops and liquid gel.   . Colon polyp   . 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   . 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   . Cystocele,  midline 03/06/2013  . Demoralization and apathy 03/06/2013   Last Assessment & Plan:  Since she has been taking bupropion only once a day, I will write the prescription with the correct instructions and correct quantity. She has done well on this for years and she is encouraged to take it regularly   . Depression   . Environmental allergies   . Epistaxis   . Essential (primary) hypertension 05/17/2013   Last Assessment & Plan:  Her blood pressure is well-controlled. We will plan to continue hydrochlorothiazide and benazepril.  Both are generic and should be affordable on her health plan.   Marland Kitchen GERD (gastroesophageal reflux disease)   . Hemorrhoid   . Inflammation of sacroiliac joint ([place]) 03/06/2013   Last Assessment & Plan:  She is doing well on her present regimen of fentanyl patch supplemented with sparing use of tramadol/acetaminophen. We will continue that regimen.   . Sinusitis   . Sleep apnea    CPAP    Family History: Family History  Problem Relation Age of Onset  . Arthritis Mother   .  Alzheimer's disease Father   . Heart disease Sister   . Cancer Brother   . Ovarian cancer Neg Hx   . Breast cancer Neg Hx   . Colon cancer Neg Hx   . Diabetes Neg Hx     Social History   Socioeconomic History  . Marital status: Widowed    Spouse name: Not on file  . Number of children: Not on file  . Years of education: Not on file  . Highest education level: Not on file  Occupational History  . Not on file  Social Needs  . Financial resource strain: Not on file  . Food insecurity:    Worry: Not on file    Inability: Not on file  . Transportation needs:    Medical: Not on file    Non-medical: Not on file  Tobacco Use  . Smoking status: Never Smoker  . Smokeless tobacco: Never Used  Substance and Sexual Activity  . Alcohol use: No    Alcohol/week: 0.0 oz  . Drug use: No  . Sexual activity: Not Currently  Lifestyle  . Physical activity:    Days per week: Not on file     Minutes per session: Not on file  . Stress: Not on file  Relationships  . Social connections:    Talks on phone: Not on file    Gets together: Not on file    Attends religious service: Not on file    Active member of club or organization: Not on file    Attends meetings of clubs or organizations: Not on file    Relationship status: Not on file  . Intimate partner violence:    Fear of current or ex partner: Not on file    Emotionally abused: Not on file    Physically abused: Not on file    Forced sexual activity: Not on file  Other Topics Concern  . Not on file  Social History Narrative  . Not on file   Review of Systems  Constitutional: Negative for chills, diaphoresis and fatigue.  HENT: Negative for ear pain, postnasal drip and sinus pressure.   Eyes: Negative for photophobia, discharge, redness, itching and visual disturbance.  Respiratory: Positive for cough. Negative for shortness of breath and wheezing.   Cardiovascular: Negative for chest pain, palpitations and leg swelling.  Gastrointestinal: Negative for abdominal pain, constipation, diarrhea, nausea and vomiting.  Genitourinary: Negative for dysuria and flank pain.  Musculoskeletal: Negative for arthralgias, back pain, gait problem and neck pain.  Skin: Negative for color change.  Allergic/Immunologic: Negative for environmental allergies and food allergies.  Neurological: Negative for dizziness and headaches.  Hematological: Does not bruise/bleed easily.  Psychiatric/Behavioral: Positive for behavioral problems (depression). Negative for agitation and hallucinations.   Vital Signs: BP 128/61   Pulse 67   Resp 16   Ht 5\' 4"  (1.626 m)   Wt 137 lb 3.2 oz (62.2 kg)   SpO2 98%   BMI 23.55 kg/m    Physical Exam  Constitutional: She appears well-developed and well-nourished. No distress.  HENT:  Head: Normocephalic and atraumatic.  Mouth/Throat: Oropharynx is clear and moist. No oropharyngeal exudate.  Eyes:  Pupils are equal, round, and reactive to light. EOM are normal.  Neck: Normal range of motion. Neck supple. No JVD present. No tracheal deviation present. No thyromegaly present.  Cardiovascular: Regular rhythm and normal heart sounds. Exam reveals no gallop and no friction rub.  No murmur heard. Pulmonary/Chest: Effort normal. No respiratory distress. She has  no wheezes. She has no rales. She exhibits no tenderness.  Abdominal: Soft.  Musculoskeletal: She exhibits edema and tenderness.  Lymphadenopathy:    She has no cervical adenopathy.  Neurological: She is alert. No cranial nerve deficit.  Skin: Skin is warm and dry. She is not diaphoretic.  Psychiatric: She has a normal mood and affect. Her behavior is normal. Judgment normal.   Assessment/Plan: 1. Cough due to ACE inhibitor Stop Benazepril/hctz  2. Generalized anxiety disorder Continue  escitalopram (LEXAPRO) 20 MG tablet; Take 2 tabs every day  Dispense: 60 tablet; Refill: 12  3. Non-seasonal allergic rhinitis due to pollen Patient has significant allergies with nasal congestion, cough and headaches. Medical therapy has been maximized with ongoing use of antihistamines, intranasal corticosteroids and LTRAs ( Singulair). Pt is advised for allergy testing to further diagnose and start a proper treatment plan. Allergy Test  4. Systemic primary arterial hypertension  Start bisoprolol-hydrochlorothiazide (ZIAC) 2.5-6.25 MG tablet; Take one tab po qd for bp  Dispense: 30 tablet; Refill: 12  5. Primary generalized (osteo)arthritis Stopped tramadol due to hallucinations. Continue OTC tylenol arthritis   6. Hallucinations Her family is not with her, it is unclear if she is still hallucinating at home   General Counseling: bera pinela understanding of the findings of todays visit and agrees with plan of treatment. I have discussed any further diagnostic evaluation that may be needed or ordered today. We also reviewed her medications  today. she has been encouraged to call the office with any questions or concerns that should arise related to todays visit.  Orders Placed This Encounter  Procedures  . Allergy Test    Meds ordered this encounter  Medications  . bisoprolol-hydrochlorothiazide (ZIAC) 2.5-6.25 MG tablet    Sig: Take one tab po qd for bp    Dispense:  30 tablet    Refill:  12  . escitalopram (LEXAPRO) 20 MG tablet    Sig: Take 2 tabs every day    Dispense:  60 tablet    Refill:  12    Time spent: 25 Minutes   Dr Lavera Guise Internal medicine

## 2018-01-05 NOTE — Telephone Encounter (Signed)
Called phar and advised them to put note in that pt is no longer on benzapril-HCTZ please d/c and dr Humphrey Rolls bisoprolol -hctz to her phar

## 2018-01-06 NOTE — Progress Notes (Signed)
ESCITALOPRAM 20MG   TWICE A DAY HAS BEEN APPROVED , PHARMACY NOTIFIED

## 2018-02-02 ENCOUNTER — Ambulatory Visit: Payer: Self-pay | Admitting: Internal Medicine

## 2018-03-02 ENCOUNTER — Ambulatory Visit (INDEPENDENT_AMBULATORY_CARE_PROVIDER_SITE_OTHER): Payer: Medicare HMO | Admitting: Adult Health

## 2018-03-02 ENCOUNTER — Encounter: Payer: Self-pay | Admitting: Adult Health

## 2018-03-02 VITALS — BP 128/70 | HR 66 | Resp 16 | Ht 64.0 in | Wt 142.8 lb

## 2018-03-02 DIAGNOSIS — F411 Generalized anxiety disorder: Secondary | ICD-10-CM

## 2018-03-02 DIAGNOSIS — N393 Stress incontinence (female) (male): Secondary | ICD-10-CM | POA: Diagnosis not present

## 2018-03-02 DIAGNOSIS — Z0001 Encounter for general adult medical examination with abnormal findings: Secondary | ICD-10-CM

## 2018-03-02 DIAGNOSIS — G4733 Obstructive sleep apnea (adult) (pediatric): Secondary | ICD-10-CM

## 2018-03-02 DIAGNOSIS — G2581 Restless legs syndrome: Secondary | ICD-10-CM

## 2018-03-02 DIAGNOSIS — R69 Illness, unspecified: Secondary | ICD-10-CM | POA: Diagnosis not present

## 2018-03-02 DIAGNOSIS — I10 Essential (primary) hypertension: Secondary | ICD-10-CM

## 2018-03-02 DIAGNOSIS — R300 Dysuria: Secondary | ICD-10-CM | POA: Diagnosis not present

## 2018-03-02 DIAGNOSIS — R443 Hallucinations, unspecified: Secondary | ICD-10-CM

## 2018-03-02 NOTE — Progress Notes (Signed)
[facility] [place], [place] [address]  Internal MEDICINE  Office Visit Note  Patient Name: Kristen Pennington  [id number]  [id number]  Date of Service: 03/20/2018  Chief Complaint  Patient presents with  . Annual Exam  . Hypertension  . Depression  . Osteoarthritis    HPI Pt here for medicare well visit.  Kristen Pennington has multiple stable chronic medical conditions. Kristen Pennington has been taking all her medications.  Kristen Pennington does have a history of hallucinations/confusion and paranoia surrounding her computer and usage. Today Kristen Pennington is explaining to me how there is a Public affairs consultant in her computer causing her grief.  Kristen Pennington states "I know you'll think I'm crazy and my children do too, but I know he's in there and i'm just going to ignore him for now".  Pt is smiling, pleasant affect, appears to be answering questions appropriately.   Kristen Pennington is generally in good health, and denies complaints.     Current Medication: Outpatient Encounter Medications as of 03/02/2018  Medication Sig Note  . acetaminophen (TYLENOL) 500 MG tablet Take 500 mg by mouth every 6 (six) hours as needed.   Marland Kitchen atenolol (TENORMIN) 25 MG tablet Take 1 tablet (25 mg total) by mouth daily. For bp   . bisoprolol-hydrochlorothiazide (ZIAC) 2.5-6.25 MG tablet Take one tab po qd for bp   . Carboxymethylcellulose Sodium (THERATEARS OP) Apply to eye.   . Cholecalciferol (VITAMIN D3) 5000 units TABS Take 5,000 Units by mouth daily.   . clonazePAM (KLONOPIN) 1 MG tablet Take 1/2 tablet PO three times a day for anxiety   . dicyclomine (BENTYL) 10 MG capsule Take 10 mg by mouth 4 (four) times daily -  before meals and at bedtime.   . docusate sodium (COLACE) 50 MG capsule Take 50 mg by mouth daily as needed for mild constipation. Mon, Wed, Fri   . escitalopram (LEXAPRO) 20 MG tablet Take 2 tabs every day   . estradiol (ESTRACE) 0.1 MG/GM vaginal cream Place vaginally as needed.  03/26/2015: Received from: [facility]  . fentaNYL  (DURAGESIC - DOSED MCG/HR) 25 MCG/HR patch Place onto the skin.  03/26/2015: Received from: External Pharmacy  . fluticasone (FLONASE) 50 MCG/ACT nasal spray 2 sprays by Each Nare route daily. 03/26/2015: Received from: [facility]  . Liniments (SALONPAS PAIN RELIEF PATCH EX) Apply 1 patch topically 2 (two) times daily.   Marland Kitchen lubiprostone (AMITIZA) 8 MCG capsule TAKE 1 CAPSULE BY MOUTH TWICE DAILY WITHA MEAL 04/08/2016: Received from: [facility]  . mirtazapine (REMERON) 15 MG tablet TAKE ONE TABLET BY MOUTH AS NEEDED FOR SLEEP   . Multiple Vitamin (MULTI-VITAMINS) TABS Take by mouth. 04/08/2016: Received from: [place]: Take 1 tablet by mouth once daily.  . Multiple Vitamins-Minerals (CENTRUM SILVER 50+WOMEN) TABS Take by mouth.   Marland Kitchen omeprazole (PRILOSEC) 40 MG capsule TAKE 1 CAPSULE BY MOUTH DAILY   . oxybutynin (DITROPAN-XL) 10 MG 24 hr tablet TAKE ONE TABLET EVERY DAY   . rOPINIRole (REQUIP) 1 MG tablet TAKE ONE TABLET 3 TIMES DAILY AS NEEDED FOR RESTLESS LEGS   . shark liver oil-cocoa butter (PREPARATION H) 0.25-3-85.5 % suppository Place 1 suppository rectally as needed for hemorrhoids. 05/03/2015: PRN  . traMADol-acetaminophen (ULTRACET) 37.5-325 MG per tablet  03/26/2015: Received from: External Pharmacy  . trolamine salicylate (ASPERCREME) 10 % cream Apply topically. 04/08/2016: Received from: [place]: Apply topically as needed.  . [DISCONTINUED] ranitidine (ZANTAC)  150 MG tablet TAKE ONE TABLET EVERY DAY   . [DISCONTINUED] azithromycin (ZITHROMAX Z-PAK) 250 MG tablet Use as directed for 5 days (Patient not taking: Reported on 01/05/2018)    No facility-administered encounter medications on file as of 03/02/2018.     Surgical History: Past Surgical History:  Procedure Laterality Date  . COLONOSCOPY  05-03-15  . COLONOSCOPY WITH PROPOFOL N/A 05/03/2015   Procedure: COLONOSCOPY WITH PROPOFOL;  Surgeon:  Lucilla Lame, MD;  Location: [place];  Service: Endoscopy;  Laterality: N/A;  CPAP  . DILATION AND CURETTAGE OF UTERUS  1970  . ENDOMETRIAL BIOPSY    . EYE SURGERY Right 2017  . POLYPECTOMY  05/03/2015   Procedure: POLYPECTOMY;  Surgeon: Lucilla Lame, MD;  Location: [place];  Service: Endoscopy;;  . ROTATOR CUFF REPAIR  2007  . SPINE SURGERY      Medical History: Past Medical History:  Diagnosis Date  . Arthritis   . Atrophic vaginitis 03/06/2013   Last Assessment & Plan:  Kristen Pennington will plan to continue estradiol vaginal cream.  Prescription was rewritten stipulating use of the generic product.   . Atypical migraine 03/06/2013   Last Assessment & Plan:  Since Kristen Pennington rarely takes sumatriptan, we will discontinue it. Continue sparing use of OTC migraine products.   . Avitaminosis D 03/06/2013   Last Assessment & Plan:  Recheck vitamin D level. Plan to continue vitamin D supplementation.   . Ceratitis 08/01/2013   Last Assessment & Plan:  Because of the expense, Kristen Pennington will discontinue Restasis. Kristen Pennington will use over-the-counter moisturizing eyedrops and liquid gel.   . Colon polyp   . 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   . 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   . Cystocele, midline 03/06/2013  . Demoralization and apathy 03/06/2013   Last Assessment & Plan:  Since Kristen Pennington has been taking bupropion only once a day, I will write the prescription with the correct instructions and correct quantity. Kristen Pennington has done well on this for years and Kristen Pennington is encouraged to take it regularly   . Depression   . Environmental allergies   . Epistaxis   . Essential (primary) hypertension 05/17/2013   Last Assessment & Plan:  Her blood pressure is well-controlled. We will plan to continue hydrochlorothiazide and benazepril.   Both are generic and should be affordable on her health plan.   Marland Kitchen GERD (gastroesophageal reflux disease)   . Hemorrhoid   . Inflammation of sacroiliac joint ([place]) 03/06/2013   Last Assessment & Plan:  Kristen Pennington is doing well on her present regimen of fentanyl patch supplemented with sparing use of tramadol/acetaminophen. We will continue that regimen.   . Sinusitis   . Sleep apnea    CPAP    Family History: Family History  Problem Relation Age of Onset  . Arthritis Mother   . Alzheimer's disease Father   . Heart disease Sister   . Cancer Brother   . Ovarian cancer Neg Hx   . Breast cancer Neg Hx   . Colon cancer Neg Hx   . Diabetes Neg Hx     Social History   Socioeconomic History  . Marital status: Widowed    Spouse name: Not on file  . Number of children: Not on file  . Years of education: Not on file  .  Highest education level: Not on file  Occupational History  . Not on file  Social Needs  . Financial resource strain: Not on file  . Food insecurity:    Worry: Not on file    Inability: Not on file  . Transportation needs:    Medical: Not on file    Non-medical: Not on file  Tobacco Use  . Smoking status: Never Smoker  . Smokeless tobacco: Never Used  Substance and Sexual Activity  . Alcohol use: No    Alcohol/week: 0.0 standard drinks  . Drug use: No  . Sexual activity: Not Currently  Lifestyle  . Physical activity:    Days per week: Not on file    Minutes per session: Not on file  . Stress: Not on file  Relationships  . Social connections:    Talks on phone: Not on file    Gets together: Not on file    Attends religious service: Not on file    Active member of club or organization: Not on file    Attends meetings of clubs or organizations: Not on file    Relationship status: Not on file  . Intimate partner violence:    Fear of current or ex partner: Not on file    Emotionally abused: Not on file    Physically abused: Not on file    Forced sexual  activity: Not on file  Other Topics Concern  . Not on file  Social History Narrative  . Not on file      Review of Systems  Constitutional: Negative for chills, fatigue and unexpected weight change.  HENT: Negative for congestion, rhinorrhea, sneezing and sore throat.   Eyes: Negative for photophobia, pain and redness.  Respiratory: Negative for cough, chest tightness and shortness of breath.   Cardiovascular: Negative for chest pain and palpitations.  Gastrointestinal: Negative for abdominal pain, constipation, diarrhea, nausea and vomiting.  Endocrine: Negative.   Genitourinary: Negative for dysuria and frequency.  Musculoskeletal: Negative for arthralgias, back pain, joint swelling and neck pain.  Skin: Negative for rash.  Allergic/Immunologic: Negative.   Neurological: Negative for tremors and numbness.  Hematological: Negative for adenopathy. Does not bruise/bleed easily.  Psychiatric/Behavioral: Negative for behavioral problems and sleep disturbance. The patient is not nervous/anxious.     Vital Signs: BP 128/70   Pulse 66   Resp 16   Ht 5\' 4"  (1.626 m)   Wt 142 lb 12.8 oz (64.8 kg)   SpO2 99%   BMI 24.51 kg/m    Physical Exam  Constitutional: Kristen Pennington is oriented to person, place, and time. Kristen Pennington appears well-developed and well-nourished. No distress.  HENT:  Head: Normocephalic and atraumatic.  Mouth/Throat: Oropharynx is clear and moist. No oropharyngeal exudate.  Eyes: Pupils are equal, round, and reactive to light. EOM are normal.  Neck: Normal range of motion. Neck supple. No JVD present. No tracheal deviation present. No thyromegaly present.  Cardiovascular: Normal rate, regular rhythm and normal heart sounds. Exam reveals no gallop and no friction rub.  No murmur heard. Pulmonary/Chest: Effort normal and breath sounds normal. No respiratory distress. Kristen Pennington has no wheezes. Kristen Pennington has no rales. Kristen Pennington exhibits no tenderness.  Abdominal: Soft. There is no tenderness. There  is no guarding.  Musculoskeletal: Normal range of motion.  Lymphadenopathy:    Kristen Pennington has no cervical adenopathy.  Neurological: Kristen Pennington is alert and oriented to person, place, and time. No cranial nerve deficit.  Skin: Skin is warm and dry. Kristen Pennington is not diaphoretic.  Psychiatric: Kristen Pennington has a normal mood and affect. Her behavior is normal. Judgment and thought content normal.  Nursing note and vitals reviewed.  Assessment/Plan: 1. Encounter for general adult medical examination with abnormal findings Will follow up with lab results. - CBC with Differential/Platelet - Lipid Panel With LDL/HDL Ratio - TSH - T4, free - Comprehensive metabolic panel  2. Generalized anxiety disorder Pt self reports doing well.  3. Essential hypertension, benign Stable, continue medication regimen.  4. Obstructive sleep apnea (adult) (pediatric) Continue to use CPAP.   5. Dysuria -  Has h/o UTI UA/M w/rflx Culture, Routine.   6. Hallucinations - Waxing and waning course at this time , will monitor   7. Stress incontinence (female) (female) - improved   8. Restless leg syndrome - Stable on Requip, will continue General Counseling: gavriella hearst understanding of the findings of todays visit and agrees with plan of treatment. I have discussed any further diagnostic evaluation that may be needed or ordered today. We also reviewed her medications today. Kristen Pennington has been encouraged to call the office with any questions or concerns that should arise related to todays visit.    Orders Placed This Encounter  Procedures  . Microscopic Examination  . UA/M w/rflx Culture, Routine  . CBC with Differential/Platelet  . Lipid Panel With LDL/HDL Ratio  . TSH  . T4, free  . Comprehensive metabolic panel    No orders of the defined types were placed in this encounter.   Time spent: 25  Minutes   This patient was seen by Orson Gear AGNP-C in Collaboration with Dr Lavera Guise as a part of collaborative care  agreement    Dr Lavera Guise Internal medicine

## 2018-03-02 NOTE — Patient Instructions (Signed)

## 2018-03-03 LAB — MICROSCOPIC EXAMINATION
Bacteria, UA: NONE SEEN
CASTS: NONE SEEN /LPF

## 2018-03-03 LAB — UA/M W/RFLX CULTURE, ROUTINE
Bilirubin, UA: NEGATIVE
Glucose, UA: NEGATIVE
Ketones, UA: NEGATIVE
Leukocytes, UA: NEGATIVE
Nitrite, UA: NEGATIVE
PH UA: 5 (ref 5.0–7.5)
PROTEIN UA: NEGATIVE
RBC, UA: NEGATIVE
SPEC GRAV UA: 1.018 (ref 1.005–1.030)
Urobilinogen, Ur: 0.2 mg/dL (ref 0.2–1.0)

## 2018-03-07 ENCOUNTER — Other Ambulatory Visit: Payer: Self-pay

## 2018-03-07 MED ORDER — RANITIDINE HCL 150 MG PO TABS: 150.0000 mg | ORAL_TABLET | Freq: Every day | ORAL | 3 refills | Status: DC

## 2018-03-22 ENCOUNTER — Telehealth: Payer: Self-pay

## 2018-03-22 NOTE — Telephone Encounter (Signed)
Faxed medicare quality patient assessment form

## 2018-04-04 DIAGNOSIS — H353131 Nonexudative age-related macular degeneration, bilateral, early dry stage: Secondary | ICD-10-CM | POA: Diagnosis not present

## 2018-04-05 DIAGNOSIS — K219 Gastro-esophageal reflux disease without esophagitis: Secondary | ICD-10-CM | POA: Diagnosis not present

## 2018-04-05 DIAGNOSIS — R140 Abdominal distension (gaseous): Secondary | ICD-10-CM | POA: Diagnosis not present

## 2018-04-05 DIAGNOSIS — K581 Irritable bowel syndrome with constipation: Secondary | ICD-10-CM | POA: Diagnosis not present

## 2018-04-07 ENCOUNTER — Other Ambulatory Visit: Payer: Self-pay | Admitting: Internal Medicine

## 2018-04-07 DIAGNOSIS — F411 Generalized anxiety disorder: Secondary | ICD-10-CM

## 2018-04-08 ENCOUNTER — Telehealth: Payer: Self-pay

## 2018-04-08 NOTE — Telephone Encounter (Signed)
Called in phar for clonazepam 1 mg as per dfk for 1 month no refills pt had appt next month

## 2018-04-27 ENCOUNTER — Ambulatory Visit (INDEPENDENT_AMBULATORY_CARE_PROVIDER_SITE_OTHER): Payer: Medicare HMO | Admitting: Adult Health

## 2018-04-27 ENCOUNTER — Encounter: Payer: Self-pay | Admitting: Adult Health

## 2018-04-27 VITALS — BP 112/72 | HR 64 | Temp 97.8°F | Resp 16 | Ht 64.0 in | Wt 149.0 lb

## 2018-04-27 DIAGNOSIS — R443 Hallucinations, unspecified: Secondary | ICD-10-CM | POA: Diagnosis not present

## 2018-04-27 DIAGNOSIS — F410 Panic disorder [episodic paroxysmal anxiety] without agoraphobia: Secondary | ICD-10-CM | POA: Diagnosis not present

## 2018-04-27 DIAGNOSIS — F411 Generalized anxiety disorder: Secondary | ICD-10-CM | POA: Diagnosis not present

## 2018-04-27 DIAGNOSIS — I10 Essential (primary) hypertension: Secondary | ICD-10-CM

## 2018-04-27 DIAGNOSIS — R69 Illness, unspecified: Secondary | ICD-10-CM | POA: Diagnosis not present

## 2018-04-27 NOTE — Progress Notes (Signed)
[facility] [place], [place] [address]  Internal MEDICINE  Office Visit Note  Patient Name: Kristen Pennington  [id number]  [id number]  Date of Service: 04/27/2018  Chief Complaint  Patient presents with  . Anxiety    increased anxiety   . Memory Loss    memory issues      HPI Pt is here for a sick visit.  Patient here reporting increased anxiety with panic attacks.  She had her last visit she discussed how Jerold Coombe had been in her computer which caused her much grief over the last year.  Her family does not believe her and this causes her much stress at this time.  She is tried to let this go, but the Jerold Coombe still bothers her when she tries to use her computer.  She takes clonazepam 3 times a day as needed for the anxiety.  She has currently been taking half of a 0.5 mg tablet.  She reports between these pills at times she will have what she describes as a panic attack that makes her stomach hurt.  She denies other issues at this time.     Current Medication:  Outpatient Encounter Medications as of 04/27/2018  Medication Sig Note  . acetaminophen (TYLENOL) 500 MG tablet Take 500 mg by mouth every 6 (six) hours as needed.   . Carboxymethylcellulose Sodium (THERATEARS OP) Apply to eye.   . Cholecalciferol (VITAMIN D3) 5000 units TABS Take 5,000 Units by mouth daily.   . clonazePAM (KLONOPIN) 0.5 MG tablet Take 0.5 mg by mouth 3 (three) times daily as needed for anxiety. 1/2 tablet 3 times daily   . dicyclomine (BENTYL) 10 MG capsule Take 10 mg by mouth 4 (four) times daily -  before meals and at bedtime.   . docusate sodium (COLACE) 50 MG capsule Take 50 mg by mouth daily as needed for mild constipation. Mon, Wed, Fri   . escitalopram (LEXAPRO) 20 MG tablet Take 2 tabs every day   . fluticasone (FLONASE) 50 MCG/ACT nasal spray 2 sprays by Each Nare route daily. 03/26/2015: Received from: [facility]  . Liniments (SALONPAS PAIN RELIEF PATCH EX) Apply 1  patch topically 2 (two) times daily.   Marland Kitchen lubiprostone (AMITIZA) 8 MCG capsule TAKE 1 CAPSULE BY MOUTH TWICE DAILY WITHA MEAL 04/08/2016: Received from: [facility]  . mirtazapine (REMERON) 15 MG tablet TAKE ONE TABLET BY MOUTH AS NEEDED FOR SLEEP   . Multiple Vitamins-Minerals (CENTRUM SILVER 50+WOMEN) TABS Take by mouth.   Marland Kitchen omeprazole (PRILOSEC) 40 MG capsule TAKE 1 CAPSULE BY MOUTH DAILY   . oxybutynin (DITROPAN-XL) 10 MG 24 hr tablet TAKE ONE TABLET EVERY DAY   . ranitidine (ZANTAC) 150 MG tablet Take 1 tablet (150 mg total) by mouth daily.   Marland Kitchen rOPINIRole (REQUIP) 1 MG tablet TAKE ONE TABLET 3 TIMES DAILY AS NEEDED FOR RESTLESS LEGS   . shark liver oil-cocoa butter (PREPARATION H) 0.25-3-85.5 % suppository Place 1 suppository rectally as needed for hemorrhoids. 05/03/2015: PRN  . trolamine salicylate (ASPERCREME) 10 % cream Apply topically. 04/08/2016: Received from: [place]: Apply topically as needed.  Marland Kitchen atenolol (TENORMIN) 25 MG tablet Take 1 tablet (25 mg total) by mouth daily. For bp (Patient not taking: Reported on 04/27/2018)   . bisoprolol-hydrochlorothiazide (ZIAC) 2.5-6.25 MG tablet Take one tab po qd for bp (Patient not taking: Reported on 04/27/2018)   . clonazePAM (KLONOPIN) 1 MG tablet TAKE 1/2 TABLET 3  TIMES DAILY AS NEEDED FOR ANXIETY / PANIC ATTACK   . estradiol (ESTRACE) 0.1 MG/GM vaginal cream Place vaginally as needed.  03/26/2015: Received from: [facility]  . fentaNYL (DURAGESIC - DOSED MCG/HR) 25 MCG/HR patch Place onto the skin.  03/26/2015: Received from: External Pharmacy  . traMADol-acetaminophen (ULTRACET) 37.5-325 MG per tablet  03/26/2015: Received from: External Pharmacy  . [DISCONTINUED] Multiple Vitamin (MULTI-VITAMINS) TABS Take by mouth. 04/08/2016: Received from: [place]: Take 1 tablet by mouth once daily.   No facility-administered encounter medications on file as of  04/27/2018.       Medical History: Past Medical History:  Diagnosis Date  . Arthritis   . Atrophic vaginitis 03/06/2013   Last Assessment & Plan:  She will plan to continue estradiol vaginal cream.  Prescription was rewritten stipulating use of the generic product.   . Atypical migraine 03/06/2013   Last Assessment & Plan:  Since she rarely takes sumatriptan, we will discontinue it. Continue sparing use of OTC migraine products.   . Avitaminosis D 03/06/2013   Last Assessment & Plan:  Recheck vitamin D level. Plan to continue vitamin D supplementation.   . Ceratitis 08/01/2013   Last Assessment & Plan:  Because of the expense, she will discontinue Restasis. She will use over-the-counter moisturizing eyedrops and liquid gel.   . Colon polyp   . 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   . 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   . Cystocele, midline 03/06/2013  . Demoralization and apathy 03/06/2013   Last Assessment & Plan:  Since she has been taking bupropion only once a day, I will write the prescription with the correct instructions and correct quantity. She has done well on this for years and she is encouraged to take it regularly   . Depression   . Environmental allergies   . Epistaxis   . Essential (primary) hypertension 05/17/2013   Last Assessment & Plan:  Her blood pressure is well-controlled. We will plan to continue hydrochlorothiazide and benazepril.  Both are generic and should be affordable on her health plan.   Marland Kitchen GERD (gastroesophageal reflux disease)   . Hemorrhoid   . Inflammation of sacroiliac joint ([place]) 03/06/2013   Last Assessment & Plan:  She is doing well on her present regimen of fentanyl patch supplemented with sparing use of tramadol/acetaminophen. We will continue that regimen.   .  Sinusitis   . Sleep apnea    CPAP     Vital Signs: BP 112/72   Pulse 64   Temp 97.8 F (36.6 C)   Resp 16   Ht 5\' 4"  (1.626 m)   Wt 149 lb (67.6 kg)   SpO2 98%   BMI 25.58 kg/m    Review of Systems  Constitutional: Negative for chills, fatigue and unexpected weight change.       Anxiety and increased panic attacks.  HENT: Negative for congestion, rhinorrhea, sneezing and sore throat.   Eyes: Negative for photophobia, pain and redness.  Respiratory: Negative for cough, chest tightness and shortness of breath.   Cardiovascular: Negative for chest pain and palpitations.  Gastrointestinal: Negative for abdominal pain, constipation, diarrhea, nausea and vomiting.  Endocrine: Negative.   Genitourinary: Negative for dysuria and frequency.  Musculoskeletal: Negative for arthralgias, back pain, joint swelling and neck  pain.  Skin: Negative for rash.  Allergic/Immunologic: Negative.   Neurological: Negative for tremors and numbness.  Hematological: Negative for adenopathy. Does not bruise/bleed easily.  Psychiatric/Behavioral: Negative for behavioral problems and sleep disturbance. The patient is not nervous/anxious.     Physical Exam  Constitutional: She is oriented to person, place, and time. She appears well-developed and well-nourished. No distress.  HENT:  Head: Normocephalic and atraumatic.  Mouth/Throat: Oropharynx is clear and moist. No oropharyngeal exudate.  Eyes: Pupils are equal, round, and reactive to light. EOM are normal.  Neck: Normal range of motion. Neck supple. No JVD present. No tracheal deviation present. No thyromegaly present.  Cardiovascular: Normal rate, regular rhythm and normal heart sounds. Exam reveals no gallop and no friction rub.  No murmur heard. Pulmonary/Chest: Effort normal and breath sounds normal. No respiratory distress. She has no wheezes. She has no rales. She exhibits no tenderness.  Abdominal: Soft. There is no tenderness. There is no  guarding.  Musculoskeletal: Normal range of motion.  Lymphadenopathy:    She has no cervical adenopathy.  Neurological: She is alert and oriented to person, place, and time. No cranial nerve deficit.  Skin: Skin is warm and dry. She is not diaphoretic.  Psychiatric: She has a normal mood and affect. Her behavior is normal. Judgment and thought content normal.  Nursing note and vitals reviewed.   Assessment/Plan: 1. Generalized anxiety disorder Overall patient's anxiety was doing well with her as needed clonazepam.  She had recently moved to apartment building for [place] across from our office a few months ago.  At first she really enjoyed this move but now is starting to feel lonely.  Encouraged continued compliance with medications.  We will follow-up in another 2 months to address increasing medications.  2. Panic attack Patient reports intermittent spells that she describes as panic attacks.  Encouraged patient to take a whole clonazepam at the time of these attacks.  Also encourage patient to get over-the-counter Tums to combat the stomach issues that she has surrounding these attacks also.  3. Essential hypertension, benign Blood pressure well controlled at this time, patient is not tachycardic.  Continue current medications for hypertension.  4. Hallucinations Patient continues to have hallucinations especially surrounding her computer.  Encourage compliance with medications.  General Counseling: joany khatib understanding of the findings of todays visit and agrees with plan of treatment. I have discussed any further diagnostic evaluation that may be needed or ordered today. We also reviewed her medications today. she has been encouraged to call the office with any questions or concerns that should arise related to todays visit.   No orders of the defined types were placed in this encounter.   No orders of the defined types were placed in this encounter.   Time spent: 25  Minutes  This patient was seen by Orson Gear AGNP-C in Collaboration with Dr Lavera Guise as a part of collaborative care agreement.  Kendell Bane AGNP-C Internal Medicine

## 2018-05-02 ENCOUNTER — Ambulatory Visit: Payer: Medicare HMO

## 2018-05-02 ENCOUNTER — Encounter: Payer: Self-pay | Admitting: Adult Health

## 2018-05-02 ENCOUNTER — Other Ambulatory Visit: Payer: Self-pay | Admitting: Internal Medicine

## 2018-05-02 ENCOUNTER — Ambulatory Visit (INDEPENDENT_AMBULATORY_CARE_PROVIDER_SITE_OTHER): Payer: Medicare HMO | Admitting: Adult Health

## 2018-05-02 VITALS — BP 140/80 | HR 78 | Resp 16 | Ht 66.0 in | Wt 150.0 lb

## 2018-05-02 DIAGNOSIS — I10 Essential (primary) hypertension: Secondary | ICD-10-CM

## 2018-05-02 DIAGNOSIS — Z79899 Other long term (current) drug therapy: Secondary | ICD-10-CM

## 2018-05-02 DIAGNOSIS — E785 Hyperlipidemia, unspecified: Secondary | ICD-10-CM

## 2018-05-02 DIAGNOSIS — L729 Follicular cyst of the skin and subcutaneous tissue, unspecified: Secondary | ICD-10-CM

## 2018-05-02 DIAGNOSIS — F411 Generalized anxiety disorder: Secondary | ICD-10-CM | POA: Diagnosis not present

## 2018-05-02 DIAGNOSIS — R69 Illness, unspecified: Secondary | ICD-10-CM | POA: Diagnosis not present

## 2018-05-02 NOTE — Progress Notes (Signed)
[facility] [place], [place] [address]  Internal MEDICINE  Office Visit Note  Patient Name: Kristen Pennington  [id number]  [id number]  Date of Service: 05/02/2018  Chief Complaint  Patient presents with  . Hypertension  . Depression  . Hyperlipidemia  . Other    place on left of neck    HPI Pt here for follow up on HTN, Depression and HLD.  She is also complaining of a small place on her left neck.  It does not itch or feel painful.  It is not bleeding or red.  Pt would like to see Dermatology for removal.  Pt reports her blood pressure has been doing well.  She reports she has been taking her HLD medications.  Her depression is stable, she continues to talk about hackers in her computer, and reports strained relationships with her children due to this.      Current Medication: Outpatient Encounter Medications as of 05/02/2018  Medication Sig Note  . acetaminophen (TYLENOL) 500 MG tablet Take 500 mg by mouth every 6 (six) hours as needed.   . bisoprolol-hydrochlorothiazide (ZIAC) 2.5-6.25 MG tablet Take one tab po qd for bp   . Carboxymethylcellulose Sodium (THERATEARS OP) Apply to eye.   . Cholecalciferol (VITAMIN D3) 5000 units TABS Take 5,000 Units by mouth daily.   . clonazePAM (KLONOPIN) 1 MG tablet TAKE 1/2 TABLET 3 TIMES DAILY AS NEEDED FOR ANXIETY / PANIC ATTACK   . dicyclomine (BENTYL) 10 MG capsule Take 10 mg by mouth 4 (four) times daily -  before meals and at bedtime.   . docusate sodium (COLACE) 50 MG capsule Take 50 mg by mouth daily as needed for mild constipation. Mon, Wed, Fri   . escitalopram (LEXAPRO) 20 MG tablet Take 2 tabs every day   . estradiol (ESTRACE) 0.1 MG/GM vaginal cream Place vaginally as needed.  03/26/2015: Received from: [facility]  . fluticasone (FLONASE) 50 MCG/ACT nasal spray 2 sprays by Each Nare route daily. 03/26/2015: Received from: [facility]  . Liniments (SALONPAS PAIN RELIEF PATCH EX) Apply 1 patch  topically 2 (two) times daily.   Marland Kitchen lubiprostone (AMITIZA) 8 MCG capsule TAKE 1 CAPSULE BY MOUTH TWICE DAILY WITHA MEAL 04/08/2016: Received from: [facility]  . mirtazapine (REMERON) 15 MG tablet TAKE ONE TABLET AS NEEDED FOR SLEEP   . Multiple Vitamins-Minerals (CENTRUM SILVER 50+WOMEN) TABS Take by mouth.   Marland Kitchen omeprazole (PRILOSEC) 40 MG capsule TAKE 1 CAPSULE BY MOUTH DAILY   . oxybutynin (DITROPAN-XL) 10 MG 24 hr tablet TAKE ONE TABLET EVERY DAY   . ranitidine (ZANTAC) 150 MG tablet Take 1 tablet (150 mg total) by mouth daily.   Marland Kitchen rOPINIRole (REQUIP) 1 MG tablet TAKE ONE TABLET 3 TIMES DAILY AS NEEDED FOR RESTLESS LEGS   . shark liver oil-cocoa butter (PREPARATION H) 0.25-3-85.5 % suppository Place 1 suppository rectally as needed for hemorrhoids. 05/03/2015: PRN  . trolamine salicylate (ASPERCREME) 10 % cream Apply topically. 04/08/2016: Received from: [place]: Apply topically as needed.  . [DISCONTINUED] mirtazapine (REMERON) 15 MG tablet TAKE ONE TABLET BY MOUTH AS NEEDED FOR SLEEP   . [DISCONTINUED] atenolol (TENORMIN) 25 MG tablet Take 1 tablet (25 mg total) by mouth daily. For bp (Patient not taking: Reported on 04/27/2018)   . [DISCONTINUED] clonazePAM (KLONOPIN) 0.5 MG tablet Take 0.5 mg by mouth 3 (three) times daily as needed for anxiety. 1/2 tablet 3 times daily   . [  DISCONTINUED] fentaNYL (DURAGESIC - DOSED MCG/HR) 25 MCG/HR patch Place onto the skin.  03/26/2015: Received from: External Pharmacy  . [DISCONTINUED] traMADol-acetaminophen (ULTRACET) 37.5-325 MG per tablet  03/26/2015: Received from: External Pharmacy   No facility-administered encounter medications on file as of 05/02/2018.     Surgical History: Past Surgical History:  Procedure Laterality Date  . COLONOSCOPY  05-03-15  . COLONOSCOPY WITH PROPOFOL N/A 05/03/2015   Procedure: COLONOSCOPY WITH PROPOFOL;  Surgeon: Lucilla Lame, MD;  Location: [place];   Service: Endoscopy;  Laterality: N/A;  CPAP  . DILATION AND CURETTAGE OF UTERUS  1970  . ENDOMETRIAL BIOPSY    . EYE SURGERY Right 2017  . POLYPECTOMY  05/03/2015   Procedure: POLYPECTOMY;  Surgeon: Lucilla Lame, MD;  Location: [place];  Service: Endoscopy;;  . ROTATOR CUFF REPAIR  2007  . SPINE SURGERY      Medical History: Past Medical History:  Diagnosis Date  . Arthritis   . Atrophic vaginitis 03/06/2013   Last Assessment & Plan:  She will plan to continue estradiol vaginal cream.  Prescription was rewritten stipulating use of the generic product.   . Atypical migraine 03/06/2013   Last Assessment & Plan:  Since she rarely takes sumatriptan, we will discontinue it. Continue sparing use of OTC migraine products.   . Avitaminosis D 03/06/2013   Last Assessment & Plan:  Recheck vitamin D level. Plan to continue vitamin D supplementation.   . Ceratitis 08/01/2013   Last Assessment & Plan:  Because of the expense, she will discontinue Restasis. She will use over-the-counter moisturizing eyedrops and liquid gel.   . Colon polyp   . 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   . 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   . Cystocele, midline 03/06/2013  . Demoralization and apathy 03/06/2013   Last Assessment & Plan:  Since she has been taking bupropion only once a day, I will write the prescription with the correct instructions and correct quantity. She has done well on this for years and she is encouraged to take it regularly   . Depression   . Environmental allergies   . Epistaxis   . Essential (primary) hypertension 05/17/2013   Last Assessment & Plan:  Her blood pressure is well-controlled. We will plan to continue hydrochlorothiazide and benazepril.  Both are generic and should be affordable on her  health plan.   Marland Kitchen GERD (gastroesophageal reflux disease)   . Hemorrhoid   . Inflammation of sacroiliac joint ([place]) 03/06/2013   Last Assessment & Plan:  She is doing well on her present regimen of fentanyl patch supplemented with sparing use of tramadol/acetaminophen. We will continue that regimen.   . Sinusitis   . Sleep apnea    CPAP    Family History: Family History  Problem Relation Age of Onset  . Arthritis Mother   . Alzheimer's disease Father   . Heart disease Sister   . Cancer Brother   . Ovarian cancer Neg Hx   . Breast cancer Neg Hx   . Colon cancer Neg Hx   . Diabetes Neg Hx     Social History   Socioeconomic History  . Marital status: Widowed    Spouse name: Not on file  . Number of children: Not on file  . Years of education: Not  on file  . Highest education level: Not on file  Occupational History  . Not on file  Social Needs  . Financial resource strain: Not on file  . Food insecurity:    Worry: Not on file    Inability: Not on file  . Transportation needs:    Medical: Not on file    Non-medical: Not on file  Tobacco Use  . Smoking status: Never Smoker  . Smokeless tobacco: Never Used  Substance and Sexual Activity  . Alcohol use: No    Alcohol/week: 0.0 standard drinks  . Drug use: No  . Sexual activity: Not Currently  Lifestyle  . Physical activity:    Days per week: Not on file    Minutes per session: Not on file  . Stress: Not on file  Relationships  . Social connections:    Talks on phone: Not on file    Gets together: Not on file    Attends religious service: Not on file    Active member of club or organization: Not on file    Attends meetings of clubs or organizations: Not on file    Relationship status: Not on file  . Intimate partner violence:    Fear of current or ex partner: Not on file    Emotionally abused: Not on file    Physically abused: Not on file    Forced sexual activity: Not on file  Other Topics Concern  . Not on  file  Social History Narrative  . Not on file      Review of Systems  Constitutional: Negative for chills, fatigue and unexpected weight change.  HENT: Negative for congestion, rhinorrhea, sneezing and sore throat.   Eyes: Negative for photophobia, pain and redness.  Respiratory: Negative for cough, chest tightness and shortness of breath.   Cardiovascular: Negative for chest pain and palpitations.  Gastrointestinal: Negative for abdominal pain, constipation, diarrhea, nausea and vomiting.  Endocrine: Negative.   Genitourinary: Negative for dysuria and frequency.  Musculoskeletal: Negative for arthralgias, back pain, joint swelling and neck pain.  Skin: Negative for rash.  Allergic/Immunologic: Negative.   Neurological: Negative for tremors and numbness.  Hematological: Negative for adenopathy. Does not bruise/bleed easily.  Psychiatric/Behavioral: Negative for behavioral problems and sleep disturbance. The patient is not nervous/anxious.     Vital Signs: BP 140/80   Pulse 78   Resp 16   Ht 5\' 6"  (1.676 m)   Wt 150 lb (68 kg)   SpO2 98%   BMI 24.21 kg/m    Physical Exam  Constitutional: She is oriented to person, place, and time. She appears well-developed and well-nourished. No distress.  HENT:  Head: Normocephalic and atraumatic.  Mouth/Throat: Oropharynx is clear and moist. No oropharyngeal exudate.  Eyes: Pupils are equal, round, and reactive to light. EOM are normal.  Neck: Normal range of motion. Neck supple. No JVD present. No tracheal deviation present. No thyromegaly present.  Cardiovascular: Normal rate, regular rhythm and normal heart sounds. Exam reveals no gallop and no friction rub.  No murmur heard. Pulmonary/Chest: Effort normal and breath sounds normal. No respiratory distress. She has no wheezes. She has no rales. She exhibits no tenderness.  Abdominal: Soft. There is no tenderness. There is no guarding.  Musculoskeletal: Normal range of motion.   Lymphadenopathy:    She has no cervical adenopathy.  Neurological: She is alert and oriented to person, place, and time. No cranial nerve deficit.  Skin: Skin is warm and dry. She is not  diaphoretic.  Psychiatric: She has a normal mood and affect. Her behavior is normal. Judgment and thought content normal.  Nursing note and vitals reviewed.   Assessment/Plan: 1. Generalized anxiety disorder Patient anxiety is intermittent and requires either half or whole tablet of clonazepam 3 times a day.  She is generally pleasant but definitely has issues apparently with her computer and the relationship with her son.  2. Essential hypertension, benign Slightly elevated at this visit.  Will monitor in the future.  3. Hyperlipidemia, unspecified hyperlipidemia type Patient is not currently taking any medications.  Will recheck lipid panel at next lab draw.  4. Skin cysts, generalized - Ambulatory referral to Dermatology   General Counseling: jame seelig understanding of the findings of todays visit and agrees with plan of treatment. I have discussed any further diagnostic evaluation that may be needed or ordered today. We also reviewed her medications today. she has been encouraged to call the office with any questions or concerns that should arise related to todays visit.    Orders Placed This Encounter  Procedures  . Ambulatory referral to Dermatology    No orders of the defined types were placed in this encounter.   Time spent: 25 Minutes   This patient was seen by Orson Gear AGNP-C in Collaboration with Dr Lavera Guise as a part of collaborative care agreement     Kendell Bane AGNP-C Internal medicine

## 2018-05-13 ENCOUNTER — Other Ambulatory Visit: Payer: Self-pay | Admitting: Internal Medicine

## 2018-05-13 NOTE — Progress Notes (Signed)
Pt just came in with all her medication for reconsillation

## 2018-05-13 NOTE — Telephone Encounter (Signed)
Last 10/19 and next 08/08/2018

## 2018-05-26 DIAGNOSIS — R69 Illness, unspecified: Secondary | ICD-10-CM | POA: Diagnosis not present

## 2018-05-31 ENCOUNTER — Telehealth: Payer: Self-pay

## 2018-05-31 NOTE — Telephone Encounter (Signed)
Pt called she having panic attack she want increased her med as per dr Humphrey Rolls adam can she her but she need to bring her son or daughter with her on visits

## 2018-06-01 ENCOUNTER — Other Ambulatory Visit: Payer: Self-pay

## 2018-06-01 MED ORDER — RANITIDINE HCL 150 MG PO TABS: 150.0000 mg | ORAL_TABLET | Freq: Every day | ORAL | 3 refills | Status: DC

## 2018-06-02 ENCOUNTER — Encounter: Payer: Self-pay | Admitting: Adult Health

## 2018-06-02 ENCOUNTER — Ambulatory Visit (INDEPENDENT_AMBULATORY_CARE_PROVIDER_SITE_OTHER): Payer: Medicare HMO | Admitting: Adult Health

## 2018-06-02 VITALS — BP 126/71 | HR 65 | Temp 98.1°F | Resp 16 | Wt 149.0 lb

## 2018-06-02 DIAGNOSIS — F410 Panic disorder [episodic paroxysmal anxiety] without agoraphobia: Secondary | ICD-10-CM | POA: Diagnosis not present

## 2018-06-02 DIAGNOSIS — F411 Generalized anxiety disorder: Secondary | ICD-10-CM

## 2018-06-02 DIAGNOSIS — I10 Essential (primary) hypertension: Secondary | ICD-10-CM

## 2018-06-02 DIAGNOSIS — R69 Illness, unspecified: Secondary | ICD-10-CM | POA: Diagnosis not present

## 2018-06-02 MED ORDER — ARIPIPRAZOLE 2 MG PO TABS: 1.0000 mg | ORAL_TABLET | Freq: Every day | ORAL | 1 refills | Status: DC

## 2018-06-02 NOTE — Progress Notes (Signed)
[facility] [place], [place] [address]  Internal MEDICINE  Office Visit Note  Patient Name: Kristen Pennington  [id number]  [id number]  Date of Service: 06/03/2018  Chief Complaint  Patient presents with  . Pain  . Anxiety    increased     HPI Pt is here for a sick visit.  Patient is here with her son in exam room.  She is reporting increased anxiety.  She is tearful sitting on exam table.  She states that her anxiety is so bad that she feels like she needs to lay in the bed to try and calm down.  She finds herself laying in the bed more hours than she is awake during the day.  She reports that she can be doing well and up moving around and then the anxiety "takes a hold of her whole body and she cannot shake it".  Patient also is reporting all over body pain.  It is in different places at different times and she cannot offer much more information about it than that.  She reports these panic attacks or anxiety attacks are very bad and she cannot do anything to stop them.  She has tried taking her clonazepam as directed and reports that it takes too long for it to take effect after she takes it.     Current Medication:  Outpatient Encounter Medications as of 06/02/2018  Medication Sig Note  . acetaminophen (TYLENOL) 500 MG tablet Take 500 mg by mouth every 6 (six) hours as needed.   . bisoprolol-hydrochlorothiazide (ZIAC) 2.5-6.25 MG tablet Take one tab po qd for bp   . Carboxymethylcellulose Sodium (THERATEARS OP) Apply to eye.   . Cholecalciferol (VITAMIN D3) 5000 units TABS Take 5,000 Units by mouth daily.   . clonazePAM (KLONOPIN) 0.5 MG tablet TAKE 1/2 TABLET BY MOUTH 3 TIMES DAILY AS NEEDED FOR ANXIETY OR PANICATTACK   . clonazePAM (KLONOPIN) 1 MG tablet TAKE 1/2 TABLET 3 TIMES DAILY AS NEEDED FOR ANXIETY / PANIC ATTACK   . dicyclomine (BENTYL) 10 MG capsule Take 10 mg by mouth 4 (four) times daily -  before meals and at bedtime.   . docusate sodium  (COLACE) 50 MG capsule Take 50 mg by mouth daily as needed for mild constipation. Mon, Wed, Fri   . escitalopram (LEXAPRO) 20 MG tablet Take 2 tabs every day   . estradiol (ESTRACE) 0.1 MG/GM vaginal cream Place vaginally as needed.  03/26/2015: Received from: [facility]  . fluticasone (FLONASE) 50 MCG/ACT nasal spray 1 SPRAY IN EACH NOSTRIL ONCE ADAY AS NEEDED   . Liniments (SALONPAS PAIN RELIEF PATCH EX) Apply 1 patch topically 2 (two) times daily.   Marland Kitchen lubiprostone (AMITIZA) 8 MCG capsule TAKE 1 CAPSULE BY MOUTH TWICE DAILY WITHA MEAL 04/08/2016: Received from: [facility]  . mirtazapine (REMERON) 15 MG tablet TAKE ONE TABLET AS NEEDED FOR SLEEP   . Multiple Vitamins-Minerals (CENTRUM SILVER 50+WOMEN) TABS Take by mouth.   Marland Kitchen omeprazole (PRILOSEC) 40 MG capsule TAKE 1 CAPSULE BY MOUTH DAILY   . oxybutynin (DITROPAN-XL) 10 MG 24 hr tablet TAKE ONE TABLET EVERY DAY   . ranitidine (ZANTAC) 150 MG tablet Take 1 tablet (150 mg total) by mouth daily.   Marland Kitchen rOPINIRole (REQUIP) 1 MG tablet TAKE ONE TABLET 3 TIMES DAILY AS NEEDED FOR RESTLESS LEGS   . shark liver oil-cocoa butter (PREPARATION H) 0.25-3-85.5 % suppository Place 1 suppository rectally as needed for hemorrhoids. 05/03/2015: PRN  .  trolamine salicylate (ASPERCREME) 10 % cream Apply topically. 04/08/2016: Received from: [place]: Apply topically as needed.  . ARIPiprazole (ABILIFY) 2 MG tablet Take 0.5 tablets (1 mg total) by mouth daily.    No facility-administered encounter medications on file as of 06/02/2018.       Medical History: Past Medical History:  Diagnosis Date  . Arthritis   . Atrophic vaginitis 03/06/2013   Last Assessment & Plan:  She will plan to continue estradiol vaginal cream.  Prescription was rewritten stipulating use of the generic product.   . Atypical migraine 03/06/2013   Last Assessment & Plan:  Since she rarely takes sumatriptan, we will discontinue it.  Continue sparing use of OTC migraine products.   . Avitaminosis D 03/06/2013   Last Assessment & Plan:  Recheck vitamin D level. Plan to continue vitamin D supplementation.   . Ceratitis 08/01/2013   Last Assessment & Plan:  Because of the expense, she will discontinue Restasis. She will use over-the-counter moisturizing eyedrops and liquid gel.   . Colon polyp   . 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   . 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   . Cystocele, midline 03/06/2013  . Demoralization and apathy 03/06/2013   Last Assessment & Plan:  Since she has been taking bupropion only once a day, I will write the prescription with the correct instructions and correct quantity. She has done well on this for years and she is encouraged to take it regularly   . Depression   . Environmental allergies   . Epistaxis   . Essential (primary) hypertension 05/17/2013   Last Assessment & Plan:  Her blood pressure is well-controlled. We will plan to continue hydrochlorothiazide and benazepril.  Both are generic and should be affordable on her health plan.   Marland Kitchen GERD (gastroesophageal reflux disease)   . Hemorrhoid   . Inflammation of sacroiliac joint ([place]) 03/06/2013   Last Assessment & Plan:  She is doing well on her present regimen of fentanyl patch supplemented with sparing use of tramadol/acetaminophen. We will continue that regimen.   . Sinusitis   . Sleep apnea    CPAP     Vital Signs: BP 126/71   Pulse 65   Temp 98.1 F (36.7 C) (Oral)   Resp 16   Wt 149 lb (67.6 kg)   SpO2 97%   BMI 24.05 kg/m    Review of Systems  Constitutional: Negative for chills, fatigue and unexpected weight change.  HENT: Negative for congestion, rhinorrhea, sneezing and sore throat.   Eyes: Negative for photophobia, pain and  redness.  Respiratory: Negative for cough, chest tightness and shortness of breath.   Cardiovascular: Negative for chest pain and palpitations.  Gastrointestinal: Negative for abdominal pain, constipation, diarrhea, nausea and vomiting.  Endocrine: Negative.   Genitourinary: Negative for dysuria and frequency.  Musculoskeletal: Negative for arthralgias, back pain, joint swelling and neck pain.  Skin: Negative for rash.  Allergic/Immunologic: Negative.   Neurological: Negative for tremors and numbness.  Hematological: Negative for adenopathy. Does not bruise/bleed easily.  Psychiatric/Behavioral: Negative for behavioral problems and sleep disturbance. The patient is not nervous/anxious.        Anxiety, panic attacks.     Physical Exam  Constitutional: She is oriented to person, place, and time. She appears  well-developed and well-nourished. No distress.  HENT:  Head: Normocephalic and atraumatic.  Mouth/Throat: Oropharynx is clear and moist. No oropharyngeal exudate.  Eyes: Pupils are equal, round, and reactive to light. EOM are normal.  Neck: Normal range of motion. Neck supple. No JVD present. No tracheal deviation present. No thyromegaly present.  Cardiovascular: Normal rate, regular rhythm and normal heart sounds. Exam reveals no gallop and no friction rub.  No murmur heard. Pulmonary/Chest: Effort normal and breath sounds normal. No respiratory distress. She has no wheezes. She has no rales. She exhibits no tenderness.  Abdominal: Soft. There is no tenderness. There is no guarding.  Musculoskeletal: Normal range of motion.  Lymphadenopathy:    She has no cervical adenopathy.  Neurological: She is alert and oriented to person, place, and time. No cranial nerve deficit.  Skin: Skin is warm and dry. She is not diaphoretic.  Psychiatric: She has a normal mood and affect. Her behavior is normal. Judgment and thought content normal.  Nursing note and vitals  reviewed.  Assessment/Plan:  1. Panic attack Due to these panic attacks or these episodes of anxiety encourage patient that she may take 1 whole tablet of her current clonazepam up to 3 times a day.  She is currently taking half of a tablet 3 times a day with mild results.  I encouraged her to take a whole tablet in the morning and see how she does throughout the day.  2. Generalized anxiety disorder Patient is to continue her current medications and will add Abilify 1 mg daily at this time.  Also discussed referral for psychiatry with patient.  Patient became very upset and initially refused to see psychiatry.  After long consultation with her son and myself she decided she would see them for an evaluation.  I explained her that I would like them to take over her medication management as what we are doing does not appear to be working. - ARIPiprazole (ABILIFY) 2 MG tablet; Take 0.5 tablets (1 mg total) by mouth daily.  Dispense: 30 tablet; Refill: 1 - Ambulatory referral to Psychiatry  3. Essential hypertension, benign Stable, continue current medication management.  General Counseling: yardley beltran understanding of the findings of todays visit and agrees with plan of treatment. I have discussed any further diagnostic evaluation that may be needed or ordered today. We also reviewed her medications today. she has been encouraged to call the office with any questions or concerns that should arise related to todays visit.   Orders Placed This Encounter  Procedures  . Ambulatory referral to Psychiatry    Meds ordered this encounter  Medications  . ARIPiprazole (ABILIFY) 2 MG tablet    Sig: Take 0.5 tablets (1 mg total) by mouth daily.    Dispense:  30 tablet    Refill:  1    Time spent: 25 Minutes  This patient was seen by Orson Gear AGNP-C in Collaboration with Dr Lavera Guise as a part of collaborative care agreement.  Kendell Bane AGNP-C Internal Medicine

## 2018-06-02 NOTE — Patient Instructions (Signed)

## 2018-06-22 ENCOUNTER — Other Ambulatory Visit: Payer: Self-pay | Admitting: Adult Health

## 2018-06-22 MED ORDER — PANTOPRAZOLE SODIUM 40 MG PO TBEC: 40.0000 mg | DELAYED_RELEASE_TABLET | Freq: Every day | ORAL | 1 refills | Status: DC

## 2018-06-30 ENCOUNTER — Ambulatory Visit (INDEPENDENT_AMBULATORY_CARE_PROVIDER_SITE_OTHER): Payer: Medicare HMO | Admitting: Adult Health

## 2018-06-30 VITALS — BP 174/78 | HR 64 | Temp 98.3°F | Resp 16 | Wt 156.0 lb

## 2018-06-30 DIAGNOSIS — I10 Essential (primary) hypertension: Secondary | ICD-10-CM | POA: Diagnosis not present

## 2018-06-30 DIAGNOSIS — F411 Generalized anxiety disorder: Secondary | ICD-10-CM

## 2018-06-30 DIAGNOSIS — F410 Panic disorder [episodic paroxysmal anxiety] without agoraphobia: Secondary | ICD-10-CM | POA: Diagnosis not present

## 2018-06-30 DIAGNOSIS — R69 Illness, unspecified: Secondary | ICD-10-CM | POA: Diagnosis not present

## 2018-06-30 NOTE — Patient Instructions (Signed)
Living With Anxiety    After being diagnosed with an anxiety disorder, you may be relieved to know why you have felt or behaved a certain way. It is natural to also feel overwhelmed about the treatment ahead and what it will mean for your life. With care and support, you can manage this condition and recover from it.  How to cope with anxiety  Dealing with stress  Stress is your body’s reaction to life changes and events, both good and bad. Stress can last just a few hours or it can be ongoing. Stress can play a major role in anxiety, so it is important to learn both how to cope with stress and how to think about it differently.  Talk with your health care provider or a counselor to learn more about stress reduction. He or she may suggest some stress reduction techniques, such as:  · Music therapy. This can include creating or listening to music that you enjoy and that inspires you.  · Mindfulness-based meditation. This involves being aware of your normal breaths, rather than trying to control your breathing. It can be done while sitting or walking.  · Centering prayer. This is a kind of meditation that involves focusing on a word, phrase, or sacred image that is meaningful to you and that brings you peace.  · Deep breathing. To do this, expand your stomach and inhale slowly through your nose. Hold your breath for 3-5 seconds. Then exhale slowly, allowing your stomach muscles to relax.  · Self-talk. This is a skill where you identify thought patterns that lead to anxiety reactions and correct those thoughts.  · Muscle relaxation. This involves tensing muscles then relaxing them.  Choose a stress reduction technique that fits your lifestyle and personality. Stress reduction techniques take time and practice. Set aside 5-15 minutes a day to do them. Therapists can offer training in these techniques. The training may be covered by some insurance plans. Other things you can do to manage stress include:  · Keeping a  stress diary. This can help you learn what triggers your stress and ways to control your response.  · Thinking about how you respond to certain situations. You may not be able to control everything, but you can control your reaction.  · Making time for activities that help you relax, and not feeling guilty about spending your time in this way.  Therapy combined with coping and stress-reduction skills provides the best chance for successful treatment.  Medicines  Medicines can help ease symptoms. Medicines for anxiety include:  · Anti-anxiety drugs.  · Antidepressants.  · Beta-blockers.  Medicines may be used as the main treatment for anxiety disorder, along with therapy, or if other treatments are not working. Medicines should be prescribed by a health care provider.  Relationships  Relationships can play a big part in helping you recover. Try to spend more time connecting with trusted friends and family members. Consider going to couples counseling, taking family education classes, or going to family therapy. Therapy can help you and others better understand the condition.  How to recognize changes in your condition  Everyone has a different response to treatment for anxiety. Recovery from anxiety happens when symptoms decrease and stop interfering with your daily activities at home or work. This may mean that you will start to:  · Have better concentration and focus.  · Sleep better.  · Be less irritable.  · Have more energy.  · Have improved memory.  It is   important to recognize when your condition is getting worse. Contact your health care provider if your symptoms interfere with home or work and you do not feel like your condition is improving.  Where to find help and support:  You can get help and support from these sources:  · Self-help groups.  · Online and community organizations.  · A trusted spiritual leader.  · Couples counseling.  · Family education classes.  · Family therapy.  Follow these instructions  at home:  · Eat a healthy diet that includes plenty of vegetables, fruits, whole grains, low-fat dairy products, and lean protein. Do not eat a lot of foods that are high in solid fats, added sugars, or salt.  · Exercise. Most adults should do the following:  ? Exercise for at least 150 minutes each week. The exercise should increase your heart rate and make you sweat (moderate-intensity exercise).  ? Strengthening exercises at least twice a week.  · Cut down on caffeine, tobacco, alcohol, and other potentially harmful substances.  · Get the right amount and quality of sleep. Most adults need 7-9 hours of sleep each night.  · Make choices that simplify your life.  · Take over-the-counter and prescription medicines only as told by your health care provider.  · Avoid caffeine, alcohol, and certain over-the-counter cold medicines. These may make you feel worse. Ask your pharmacist which medicines to avoid.  · Keep all follow-up visits as told by your health care provider. This is important.  Questions to ask your health care provider  · Would I benefit from therapy?  · How often should I follow up with a health care provider?  · How long do I need to take medicine?  · Are there any long-term side effects of my medicine?  · Are there any alternatives to taking medicine?  Contact a health care provider if:  · You have a hard time staying focused or finishing daily tasks.  · You spend many hours a day feeling worried about everyday life.  · You become exhausted by worry.  · You start to have headaches, feel tense, or have nausea.  · You urinate more than normal.  · You have diarrhea.  Get help right away if:  · You have a racing heart and shortness of breath.  · You have thoughts of hurting yourself or others.  If you ever feel like you may hurt yourself or others, or have thoughts about taking your own life, get help right away. You can go to your nearest emergency department or call:  · Your local emergency services  (911 in the [place]  · A suicide crisis helpline, such as the National Suicide Prevention Lifeline at 1-800-273-8255. This is open 24-hours a day.  Summary  · Taking steps to deal with stress can help calm you.  · Medicines cannot cure anxiety disorders, but they can help ease symptoms.  · Family, friends, and partners can play a big part in helping you recover from an anxiety disorder.  This information is not intended to replace advice given to you by your health care provider. Make sure you discuss any questions you have with your health care provider.  Document Released: 06/23/2016 Document Revised: 06/23/2016 Document Reviewed: 06/23/2016  Elsevier Interactive Patient Education © 2019 [employer]

## 2018-06-30 NOTE — Progress Notes (Signed)
[facility] [place], [place] [address]  Internal MEDICINE  Office Visit Note  Patient Name: Kristen Pennington  [id number]  [id number]  Date of Service: 07/04/2018  Chief Complaint  Patient presents with  . Anxiety  . Hip Pain     HPI Pt is here for a sick visit. She reports back pain, and anxiety.  She was seen recently, where we referred her to Psych.  She apparently pushed this appointment out until jan 13. She remains adamant that she does not want to see psychiatry.  We again discussed that she was now on 3 medications for her anxiety and that she would need to see psychiatry.  In order to continue to be patient practice.  She reluctantly agreed again at this visit to psychiatry as directed.  She does report some overall body pain however she does not pinpoint this pain.  When prompted about it in the exam room she stated "I am not worried about that pain more worried about this anxiety and why need to go see a psychiatrist."     Current Medication:  Outpatient Encounter Medications as of 06/30/2018  Medication Sig Note  . acetaminophen (TYLENOL) 500 MG tablet Take 500 mg by mouth every 6 (six) hours as needed.   . ARIPiprazole (ABILIFY) 2 MG tablet Take 0.5 tablets (1 mg total) by mouth daily.   . bisoprolol-hydrochlorothiazide (ZIAC) 2.5-6.25 MG tablet Take one tab po qd for bp   . Carboxymethylcellulose Sodium (THERATEARS OP) Apply to eye.   . Cholecalciferol (VITAMIN D3) 5000 units TABS Take 5,000 Units by mouth daily.   . clonazePAM (KLONOPIN) 0.5 MG tablet TAKE 1/2 TABLET BY MOUTH 3 TIMES DAILY AS NEEDED FOR ANXIETY OR PANICATTACK   . clonazePAM (KLONOPIN) 1 MG tablet TAKE 1/2 TABLET 3 TIMES DAILY AS NEEDED FOR ANXIETY / PANIC ATTACK   . dicyclomine (BENTYL) 10 MG capsule Take 10 mg by mouth 4 (four) times daily -  before meals and at bedtime.   . docusate sodium (COLACE) 50 MG capsule Take 50 mg by mouth daily as needed for mild constipation.  Mon, Wed, Fri   . escitalopram (LEXAPRO) 20 MG tablet Take 2 tabs every day   . estradiol (ESTRACE) 0.1 MG/GM vaginal cream Place vaginally as needed.  03/26/2015: Received from: [facility]  . fluticasone (FLONASE) 50 MCG/ACT nasal spray 1 SPRAY IN EACH NOSTRIL ONCE ADAY AS NEEDED   . Liniments (SALONPAS PAIN RELIEF PATCH EX) Apply 1 patch topically 2 (two) times daily.   Marland Kitchen lubiprostone (AMITIZA) 8 MCG capsule TAKE 1 CAPSULE BY MOUTH TWICE DAILY WITHA MEAL 04/08/2016: Received from: [facility]  . mirtazapine (REMERON) 15 MG tablet TAKE ONE TABLET AS NEEDED FOR SLEEP   . Multiple Vitamins-Minerals (CENTRUM SILVER 50+WOMEN) TABS Take by mouth.   Marland Kitchen omeprazole (PRILOSEC) 40 MG capsule TAKE 1 CAPSULE BY MOUTH DAILY   . oxybutynin (DITROPAN-XL) 10 MG 24 hr tablet TAKE ONE TABLET EVERY DAY   . pantoprazole (PROTONIX) 40 MG tablet Take 1 tablet (40 mg total) by mouth daily.   Marland Kitchen rOPINIRole (REQUIP) 1 MG tablet TAKE ONE TABLET 3 TIMES DAILY AS NEEDED FOR RESTLESS LEGS   . shark liver oil-cocoa butter (PREPARATION H) 0.25-3-85.5 % suppository Place 1 suppository rectally as needed for hemorrhoids. 05/03/2015: PRN  . trolamine salicylate (ASPERCREME) 10 % cream Apply topically. 04/08/2016: Received from: [facility]: Apply topically as needed.   No facility-administered encounter  medications on file as of 06/30/2018.       Medical History: Past Medical History:  Diagnosis Date  . Arthritis   . Atrophic vaginitis 03/06/2013   Last Assessment & Plan:  She will plan to continue estradiol vaginal cream.  Prescription was rewritten stipulating use of the generic product.   . Atypical migraine 03/06/2013   Last Assessment & Plan:  Since she rarely takes sumatriptan, we will discontinue it. Continue sparing use of OTC migraine products.   . Avitaminosis D 03/06/2013   Last Assessment & Plan:  Recheck vitamin D level. Plan to continue vitamin D  supplementation.   . Ceratitis 08/01/2013   Last Assessment & Plan:  Because of the expense, she will discontinue Restasis. She will use over-the-counter moisturizing eyedrops and liquid gel.   . Colon polyp   . 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   . 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   . Cystocele, midline 03/06/2013  . Demoralization and apathy 03/06/2013   Last Assessment & Plan:  Since she has been taking bupropion only once a day, I will write the prescription with the correct instructions and correct quantity. She has done well on this for years and she is encouraged to take it regularly   . Depression   . Environmental allergies   . Epistaxis   . Essential (primary) hypertension 05/17/2013   Last Assessment & Plan:  Her blood pressure is well-controlled. We will plan to continue hydrochlorothiazide and benazepril.  Both are generic and should be affordable on her health plan.   Marland Kitchen GERD (gastroesophageal reflux disease)   . Hemorrhoid   . 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 We will continue that regimen.   . Sinusitis   . Sleep apnea    CPAP     Vital Signs: BP (!) 174/78 (BP Location: Left Arm, Patient Position: Sitting, Cuff Size: Normal)   Pulse 64   Temp 98.3 F (36.8 C) (Oral)   Resp 16   Wt 156 lb (70.8 kg)   SpO2 98%   BMI 25.18 kg/m    Review of Systems  Constitutional: Negative for chills, fatigue and unexpected weight change.  HENT: Negative for congestion, rhinorrhea, sneezing and sore throat.   Eyes: Negative for photophobia, pain and redness.  Respiratory: Negative for cough, chest tightness and shortness of breath.    Cardiovascular: Negative for chest pain and palpitations.  Gastrointestinal: Negative for abdominal pain, constipation, diarrhea, nausea and vomiting.  Endocrine: Negative.   Genitourinary: Negative for dysuria and frequency.  Musculoskeletal: Negative for arthralgias, back pain, joint swelling and neck pain.  Skin: Negative for rash.  Allergic/Immunologic: Negative.   Neurological: Negative for tremors and numbness.  Hematological: Negative for adenopathy. Does not bruise/bleed easily.  Psychiatric/Behavioral: Negative for behavioral problems and sleep disturbance. The patient is not nervous/anxious.     Physical Exam Vitals signs and nursing note reviewed.  Constitutional:      General: She is not in acute distress.    Appearance: She is well-developed. She is not diaphoretic.  HENT:     Head: Normocephalic and atraumatic.     Mouth/Throat:     Pharynx: No oropharyngeal exudate.  Eyes:     Pupils: Pupils are equal, round, and reactive to light.  Neck:     Musculoskeletal: Normal range of motion and neck supple.     Thyroid: No thyromegaly.     Vascular: No JVD.     Trachea: No tracheal deviation.  Cardiovascular:     Rate and Rhythm: Normal rate and regular rhythm.     Heart sounds: Normal heart sounds. No murmur. No friction rub. No gallop.   Pulmonary:     Effort: Pulmonary effort is normal. No respiratory distress.     Breath sounds: Normal breath sounds. No wheezing or rales.  Chest:     Chest wall: No tenderness.  Abdominal:     Palpations: Abdomen is soft.     Tenderness: There is no abdominal tenderness. There is no guarding.  Musculoskeletal: Normal range of motion.  Lymphadenopathy:     Cervical: No cervical adenopathy.  Skin:    General: Skin is warm and dry.  Neurological:     Mental Status: She is alert and oriented to person, place, and time.     Cranial Nerves: No cranial nerve deficit.  Psychiatric:        Behavior: Behavior normal.        Thought  Content: Thought content normal.        Judgment: Judgment normal.    Assessment/Plan: 1. Generalized anxiety disorder Patient's anxiety continues to be an issue she deals with daily.  She reports that "all I need is the love of my children" when I asked her to explain this she said that they do not believe her [place] computer or in her computer.  Bothers her very much and she feels like they do not have time for her and they just think that she is crazy.  She reports that she was on fentanyl patches and that she was forced to stop those when she noticed the computer [place]old [place] because her children felt like the medication was causing that.  However it has not changed since stopping the fentanyl patches.  She does continue to suffer with some chronic pain which I feel like is exasperated her anxiety some.  2. Panic attack She denies any recent panic attacks however she states she has been taking her clonazepam one half of a pill 3 times a day.  We again discussed that she should take a whole pill for the first dose in the morning and then a half a pill for the other 2 doses during the day.  She once again verbalized understanding of this and will try that.  3. Essential (primary) hypertension Slightly elevated at today's visit patient was anxious so we will repeat blood pressure at for future visit.  General Counseling: onell mcmath understanding of the findings of todays visit and agrees with plan of treatment. I have discussed any further diagnostic evaluation that may be needed or ordered today. We also reviewed her medications today. she has been encouraged to call the office with any questions or concerns that should arise related to todays visit.   No orders of the defined types were placed in this encounter.   No orders of the defined types were placed in this encounter.   Time spent: 25 Minutes  This patient was seen by Orson Gear AGNP-C in Collaboration with Dr Lavera Guise as a  part of collaborative care agreement.  Kendell Bane AGNP-C Internal Medicine

## 2018-07-04 ENCOUNTER — Encounter: Payer: Self-pay | Admitting: Adult Health

## 2018-07-08 ENCOUNTER — Other Ambulatory Visit: Payer: Self-pay

## 2018-07-08 NOTE — Telephone Encounter (Signed)
Phar called for clarification for pantoprazole and omeprazole as per adam d/c omeprazole continue pantoprazole

## 2018-07-25 ENCOUNTER — Ambulatory Visit: Payer: Medicare HMO | Admitting: Psychiatry

## 2018-07-25 ENCOUNTER — Encounter: Payer: Self-pay | Admitting: Psychiatry

## 2018-07-25 ENCOUNTER — Other Ambulatory Visit: Payer: Self-pay

## 2018-07-25 VITALS — BP 154/70 | HR 76 | Temp 97.5°F | Wt 157.4 lb

## 2018-07-25 DIAGNOSIS — R69 Illness, unspecified: Secondary | ICD-10-CM | POA: Diagnosis not present

## 2018-07-25 DIAGNOSIS — F330 Major depressive disorder, recurrent, mild: Secondary | ICD-10-CM | POA: Diagnosis not present

## 2018-07-25 DIAGNOSIS — F411 Generalized anxiety disorder: Secondary | ICD-10-CM

## 2018-07-25 DIAGNOSIS — F29 Unspecified psychosis not due to a substance or known physiological condition: Secondary | ICD-10-CM

## 2018-07-25 MED ORDER — ESCITALOPRAM OXALATE 20 MG PO TABS: 20.0000 mg | ORAL_TABLET | Freq: Every day | ORAL | 1 refills | Status: DC

## 2018-07-25 MED ORDER — MIRTAZAPINE 15 MG PO TABS: 15.0000 mg | ORAL_TABLET | Freq: Every day | ORAL | 1 refills | Status: DC

## 2018-07-25 NOTE — Progress Notes (Signed)
Psychiatric Initial Adult Assessment   Patient Identification: Kristen Pennington MRN:  [id number] Date of Evaluation:  07/25/2018 Referral Source: Orson Gear NP Chief Complaint: ' I am here to establish care '  Chief Complaint    Establish Care; Anxiety; Depression; Stress     Visit Diagnosis:  R/O Psychosis   ICD-10-CM   1. Generalized anxiety disorder F41.1 escitalopram (LEXAPRO) 20 MG tablet    mirtazapine (REMERON) 15 MG tablet  2. MDD (major depressive disorder), recurrent episode, mild (HCC) F33.0 escitalopram (LEXAPRO) 20 MG tablet    mirtazapine (REMERON) 15 MG tablet  3. Psychosis, unspecified psychosis type ([place]) F29     History of Present Illness:  Kristen Pennington is a 85 year old Caucasian female, widowed, lives in [place], has a history of anxiety, obstructive sleep apnea vision loss, hearing loss, essential hypertension presented to the clinic today to establish care.  Patient today reports she has been struggling with anxiety and panic attacks.  She reports she also has a history of depressive symptoms.  She reports it started after the death of several family members including her husband in late 39s and early 38.  Patient reports her depressive symptoms as sadness, lack of motivation, trouble concentrating, and so on.  She reports she is currently on medications which may be helpful.  Patient also reports anxiety symptoms.  She reports she is a Research officer, trade union and worries about everything to the extreme.  She reports she worries about her financial stuff on a daily daily basis.  She reports feeling nervous, trouble relaxing, being restless, and so on.  She denies any recent panic attacks.  Patient reports sleep is fair.  Patient also reports some possible delusions.  She reports there was a Public affairs consultant who has been stealing her passwords and also attaching her name to all the files which has been ongoing for a while.  She is currently on Abilify which she does not know if it is  helping or not.  Patient denies any suicidality.  Patient denies any hallucinations .  Patient was noted as having some memory problems and losing her train of thought while speaking.  Hence an MMSE was done on the patient and she scored 29 out of 30 on the same.   Associated Signs/Symptoms: Depression Symptoms:  depressed mood, psychomotor retardation, fatigue, difficulty concentrating, anxiety, (Hypo) Manic Symptoms:  denies Anxiety Symptoms:  Excessive Worry, Psychotic Symptoms:  Delusions, PTSD Symptoms: Negative  Past Psychiatric History: Patient denies inpatient mental health admissions in the past.  Patient does report being seen by a psychiatrist previously in 2003 but does not remember the name.  Patient denies any suicide attempts.  Previous Psychotropic Medications: Yes Klonopin, Lexapro, Abilify, mirtazapine.  Substance Abuse History in the last 12 months:  No.  Consequences of Substance Abuse: Negative  Past Medical History:  Past Medical History:  Diagnosis Date  . Arthritis   . Atrophic vaginitis 03/06/2013   Last Assessment & Plan:  She will plan to continue estradiol vaginal cream.  Prescription was rewritten stipulating use of the generic product.   . Atypical migraine 03/06/2013   Last Assessment & Plan:  Since she rarely takes sumatriptan, we will discontinue it. Continue sparing use of OTC migraine products.   . Avitaminosis D 03/06/2013   Last Assessment & Plan:  Recheck vitamin D level. Plan to continue vitamin D supplementation.   . Ceratitis 08/01/2013   Last Assessment & Plan:  Because of the expense, she will discontinue Restasis. She will use over-the-counter  moisturizing eyedrops and liquid gel.   . Colon polyp   . 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   . 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   . Cystocele, midline 03/06/2013  . Demoralization and apathy 03/06/2013   Last Assessment & Plan:  Since she has been taking bupropion only once a day, I will write the prescription with the correct instructions and correct quantity. She has done well on this for years and she is encouraged to take it regularly   . Depression   . Environmental allergies   . Epistaxis   . Essential (primary) hypertension 05/17/2013   Last Assessment & Plan:  Her blood pressure is well-controlled. We will plan to continue hydrochlorothiazide and benazepril.  Both are generic and should be affordable on her health plan.   Marland Kitchen GERD (gastroesophageal reflux disease)   . Hemorrhoid   . Inflammation of sacroiliac joint ([place]) 03/06/2013   Last Assessment & Plan:  She is doing well on her present regimen of fentanyl patch supplemented with sparing use of tramadol/acetaminophen. We will continue that regimen.   . Sinusitis   . Sleep apnea    CPAP    Past Surgical History:  Procedure Laterality Date  . COLONOSCOPY  05-03-15  . COLONOSCOPY WITH PROPOFOL N/A 05/03/2015   Procedure: COLONOSCOPY WITH PROPOFOL;  Surgeon: Lucilla Lame, MD;  Location: [facility];  Service: Endoscopy;  Laterality: N/A;  CPAP  . DILATION AND CURETTAGE OF UTERUS  1970  . ENDOMETRIAL BIOPSY    . EYE SURGERY Right 2017  . POLYPECTOMY  05/03/2015   Procedure: POLYPECTOMY;  Surgeon: Lucilla Lame, MD;  Location: [facility];  Service: Endoscopy;;  . ROTATOR CUFF REPAIR  2007  . SPINE SURGERY      Family Psychiatric History: Father-Alzheimer's disease.  Family History:  Family History  Problem Relation Age of Onset  . Arthritis Mother   . Alzheimer's disease Father   . Heart disease Sister   . Cancer Brother   . Ovarian cancer Neg Hx   . Breast cancer Neg Hx   . Colon cancer Neg Hx   . Diabetes Neg Hx     Social History:   Social History   Socioeconomic History  . Marital status:  Widowed    Spouse name: Not on file  . Number of children: 2  . Years of education: Not on file  . Highest education level: Some college, no degree  Occupational History  . Not on file  Social Needs  . Financial resource strain: Hard  . Food insecurity:    Worry: Often true    Inability: Often true  . Transportation needs:    Medical: No    Non-medical: No  Tobacco Use  . Smoking status: Never Smoker  . Smokeless tobacco: Never Used  Substance and Sexual Activity  . Alcohol use: No    Alcohol/week: 0.0 standard drinks  . Drug use: No  . Sexual activity: Not Currently  Lifestyle  . Physical activity:    Days per week: 0 days    Minutes per session: 0 min  . Stress: To some extent  Relationships  . Social connections:    Talks on phone: Not on file    Gets together: Not on file    Attends religious service: More than  4 times per year    Active member of club or organization: No    Attends meetings of clubs or organizations: Never    Relationship status: Widowed  Other Topics Concern  . Not on file  Social History Narrative  . Not on file    Additional Social History: Patient is widowed.  She lives in [place].  She has a son and a daughter who are adults.  Allergies:   Allergies  Allergen Reactions  . Linaclotide Swelling    Metabolic Disorder Labs: No results found for: HGBA1C, MPG No results found for: PROLACTIN No results found for: CHOL, TRIG, HDL, CHOLHDL, VLDL, LDLCALC Lab Results  Component Value Date   TSH 1.730 09/07/2017    Therapeutic Level Labs: No results found for: LITHIUM No results found for: CBMZ No results found for: VALPROATE  Current Medications: Current Outpatient Medications  Medication Sig Dispense Refill  . acetaminophen (TYLENOL) 500 MG tablet Take 500 mg by mouth every 6 (six) hours as needed.    . ARIPiprazole (ABILIFY) 2 MG tablet Take 0.5 tablets (1 mg total) by mouth daily. 30 tablet 1  . bisoprolol-hydrochlorothiazide  (ZIAC) 2.5-6.25 MG tablet Take one tab po qd for bp 30 tablet 12  . Carboxymethylcellulose Sodium (THERATEARS OP) Apply to eye.    . Cholecalciferol (VITAMIN D3) 5000 units TABS Take 5,000 Units by mouth daily.    . clonazePAM (KLONOPIN) 0.5 MG tablet TAKE 1/2 TABLET BY MOUTH 3 TIMES DAILY AS NEEDED FOR ANXIETY OR PANICATTACK 45 tablet 3  . dicyclomine (BENTYL) 10 MG capsule Take 10 mg by mouth 4 (four) times daily -  before meals and at bedtime.    . docusate sodium (COLACE) 50 MG capsule Take 50 mg by mouth daily as needed for mild constipation. Mon, Wed, Fri    . estradiol (ESTRACE) 0.1 MG/GM vaginal cream Place vaginally as needed.     . fluticasone (FLONASE) 50 MCG/ACT nasal spray 1 SPRAY IN EACH NOSTRIL ONCE ADAY AS NEEDED 16 g 1  . ibuprofen (ADVIL,MOTRIN) 200 MG tablet Take 200 mg by mouth every 6 (six) hours as needed.    . Liniments (SALONPAS PAIN RELIEF PATCH EX) Apply 1 patch topically 2 (two) times daily.    Marland Kitchen lubiprostone (AMITIZA) 8 MCG capsule TAKE 1 CAPSULE BY MOUTH TWICE DAILY WITHA MEAL    . Multiple Vitamins-Minerals (CENTRUM SILVER 50+WOMEN) TABS Take by mouth.    . oxybutynin (DITROPAN-XL) 10 MG 24 hr tablet TAKE ONE TABLET EVERY DAY 30 tablet 3  . pantoprazole (PROTONIX) 40 MG tablet Take 1 tablet (40 mg total) by mouth daily. 90 tablet 1  . promethazine (PHENERGAN) 25 MG tablet Take by mouth.    . ranitidine (ZANTAC) 150 MG tablet Take by mouth.    Marland Kitchen rOPINIRole (REQUIP) 1 MG tablet TAKE ONE TABLET 3 TIMES DAILY AS NEEDED FOR RESTLESS LEGS 90 tablet 3  . shark liver oil-cocoa butter (PREPARATION H) 0.25-3-85.5 % suppository Place 1 suppository rectally as needed for hemorrhoids.    Marland Kitchen trolamine salicylate (ASPERCREME) 10 % cream Apply topically.    . benazepril-hydrochlorthiazide (LOTENSIN HCT) 20-25 MG tablet Take by mouth.    . Calcium Carb-Cholecalciferol 600-800 MG-UNIT TABS Take by mouth.    . escitalopram (LEXAPRO) 20 MG tablet Take 1 tablet (20 mg total) by mouth  daily. 30 tablet 1  . magnesium hydroxide (MILK OF MAGNESIA) 400 MG/5ML suspension Take by mouth.    . mirtazapine (REMERON) 15 MG tablet Take 1 tablet (  15 mg total) by mouth at bedtime. 30 tablet 1  . omeprazole (PRILOSEC) 40 MG capsule Take by mouth.    . shark liver oil-cocoa butter (PREPARATION H) 0.25-88.44 % suppository Place rectally.     No current facility-administered medications for this visit.     Musculoskeletal: Strength & Muscle Tone: within normal limits Gait & Station: normal Patient leans: N/A  Psychiatric Specialty Exam: Review of Systems  Musculoskeletal: Positive for back pain.  Psychiatric/Behavioral: Positive for depression. The patient is nervous/anxious.   All other systems reviewed and are negative.   Blood pressure (!) 154/70, pulse 76, temperature (!) 97.5 F (36.4 C), temperature source Oral, weight 157 lb 6.4 oz (71.4 kg).Body mass index is 25.41 kg/m.  General Appearance: Casual  Eye Contact:  Fair  Speech:  Normal Rate  Volume:  Normal  Mood:  Anxious and Depressed  Affect:  Appropriate  Thought Process:  Goal Directed and Descriptions of Associations: Intact  Orientation:  Full (Time, Place, and Person)  Thought Content:  Delusions- pt talks about some one stealing her password through her computers and also a Paediatric nurse everything on her computer to her name - not sure if these are true delusions   Suicidal Thoughts:  No  Homicidal Thoughts:  No  Memory:  Immediate;   Fair Recent;   Fair Remote;   Fair  Judgement:  Fair  Insight:  Fair  Psychomotor Activity:  Normal  Concentration:  Concentration: Fair and Attention Span: Fair  Recall:  [employer] of Knowledge:Fair  Language: Fair  Akathisia:  No  Handed:  Right  AIMS (if indicated):  Denies rigidity,stiffness  Assets:  Communication Skills Desire for Improvement Social Support  ADL's:  Intact  Cognition: WNL  Sleep:  Fair   Screenings: Mini-Mental     Clinical Support  from 03/02/2018 in [facility], [facility]  Total Score (max 30 points )  27    PHQ2-9     Office Visit from 06/30/2018 in [facility], [facility] Office Visit from 05/02/2018 in [facility], [place] from 03/02/2018 in [facility], [facility] Office Visit from 01/05/2018 in [facility], [facility] Office Visit from 10/05/2017 in [facility], [facility]  PHQ-2 Total Score  0  0  0  0  0      Assessment and Plan: Kristen Pennington is a 85 year old Caucasian female, widowed, has a history of anxiety disorder, essential hypertension, chronic pain, OSA, vision loss, hearing loss, presented to the clinic today to establish care.  Patient is biologically predisposed given her multiple medical problems.  She also has psychosocial stressors of being widowed.  Patient likely with chronic delusions and also depressive symptoms.  Patient will benefit from medications as well as psychotherapy sessions.  Plan as noted below.  Plan Generalized anxiety disorder-unstable Will increase mirtazapine to 15 mg p.o. daily at bedtime.  She reports she has been taking it only as needed Reduce Lexapro to 20 mg p.o. daily. Patient to continue clonazepam as prescribed for now.  MDD -unstable Mirtazapine as prescribed Continue Abilify 1 mg p.o. daily.   Rule out psychosis Continue Abilify We will obtain collateral information from her son.  Patient agrees to bring her son for the next visit. I have reviewed medical records in [facility] per Evie Lacks, NP-06/30/2018- 'patient with generalized anxiety disorder reports she is worried that nobody believes her computer is being hacked.  Bothers her very much and they just think she is crazy.  She was on fentanyl patches which was forced to stop since it was noted as a likely cause for her believing in the computer [place].  Patient was advised to continue clonazepam as well as her other medications.'  Completed MMSE on patient  today-29 out of 30  We will order TSH, lipid panel, hemoglobin A1c, prolactin, vitamin B12.  Patient advised to return to clinic in 1 to 2 weeks.  Patient advised to bring her son for collateral information.  I have spent atleast 60 minutes  face to face with patient today. More than 50 % of the time was spent for psychoeducation and supportive psychotherapy and care coordination.  This note was generated in part or whole with voice recognition software. Voice recognition is usually quite accurate but there are transcription errors that can and very often do occur. I apologize for any typographical errors that were not detected and corrected.        Ursula Alert, MD 1/13/20205:57 PM

## 2018-08-01 ENCOUNTER — Other Ambulatory Visit: Payer: Self-pay | Admitting: Psychiatry

## 2018-08-01 DIAGNOSIS — R69 Illness, unspecified: Secondary | ICD-10-CM | POA: Diagnosis not present

## 2018-08-02 ENCOUNTER — Ambulatory Visit: Payer: Self-pay | Admitting: Nurse Practitioner

## 2018-08-02 ENCOUNTER — Telehealth: Payer: Self-pay | Admitting: Psychiatry

## 2018-08-02 LAB — LIPID PANEL WITH LDL/HDL RATIO
Cholesterol, Total: 245 mg/dL — ABNORMAL HIGH (ref 100–199)
HDL: 49 mg/dL (ref >39)
LDL Calculated: 159 mg/dL — ABNORMAL HIGH (ref 0–99)
LDl/HDL Ratio: 3.2 ratio (ref 0.0–3.2)
TRIGLYCERIDES: 187 mg/dL — AB (ref 0–149)
VLDL Cholesterol Cal: 37 mg/dL (ref 5–40)

## 2018-08-02 LAB — HGB A1C W/O EAG: Hgb A1c MFr Bld: 5.4 % (ref 4.8–5.6)

## 2018-08-02 LAB — TSH: TSH: 5.55 u[IU]/mL — ABNORMAL HIGH (ref 0.450–4.500)

## 2018-08-02 LAB — PROLACTIN: Prolactin: 8.1 ng/mL (ref 4.8–23.3)

## 2018-08-02 NOTE — Telephone Encounter (Signed)
Reviewed TSH from [facility] - reported 08/01/2018 - elevated at 5.550 . Will as Kristen Pennington to forward to PMD as well as patient for further management,. Lipid panel is also abnormal.

## 2018-08-08 ENCOUNTER — Encounter: Payer: Self-pay | Admitting: Nurse Practitioner

## 2018-08-08 ENCOUNTER — Encounter: Payer: Self-pay | Admitting: Psychiatry

## 2018-08-08 ENCOUNTER — Ambulatory Visit: Payer: Self-pay | Admitting: Adult Health

## 2018-08-08 ENCOUNTER — Other Ambulatory Visit: Payer: Self-pay

## 2018-08-08 ENCOUNTER — Ambulatory Visit (INDEPENDENT_AMBULATORY_CARE_PROVIDER_SITE_OTHER): Payer: Medicare HMO | Admitting: Psychiatry

## 2018-08-08 ENCOUNTER — Ambulatory Visit (INDEPENDENT_AMBULATORY_CARE_PROVIDER_SITE_OTHER): Payer: Medicare HMO | Admitting: Nurse Practitioner

## 2018-08-08 VITALS — BP 130/80 | HR 72 | Resp 16 | Ht 66.0 in | Wt 158.0 lb

## 2018-08-08 VITALS — BP 189/74 | HR 72 | Temp 98.0°F | Wt 156.8 lb

## 2018-08-08 DIAGNOSIS — E538 Deficiency of other specified B group vitamins: Secondary | ICD-10-CM

## 2018-08-08 DIAGNOSIS — R7989 Other specified abnormal findings of blood chemistry: Secondary | ICD-10-CM

## 2018-08-08 DIAGNOSIS — F29 Unspecified psychosis not due to a substance or known physiological condition: Secondary | ICD-10-CM | POA: Diagnosis not present

## 2018-08-08 DIAGNOSIS — M545 Low back pain: Secondary | ICD-10-CM | POA: Diagnosis not present

## 2018-08-08 DIAGNOSIS — F330 Major depressive disorder, recurrent, mild: Secondary | ICD-10-CM | POA: Diagnosis not present

## 2018-08-08 DIAGNOSIS — I10 Essential (primary) hypertension: Secondary | ICD-10-CM

## 2018-08-08 DIAGNOSIS — R69 Illness, unspecified: Secondary | ICD-10-CM | POA: Diagnosis not present

## 2018-08-08 DIAGNOSIS — F411 Generalized anxiety disorder: Secondary | ICD-10-CM | POA: Diagnosis not present

## 2018-08-08 DIAGNOSIS — G8929 Other chronic pain: Secondary | ICD-10-CM

## 2018-08-08 MED ORDER — ARIPIPRAZOLE 2 MG PO TABS: 1.0000 mg | ORAL_TABLET | Freq: Every day | ORAL | 1 refills | Status: DC

## 2018-08-08 NOTE — Progress Notes (Signed)
[facility] MD OP Progress Note  08/08/2018 1:11 PM INFANT DOANE  MRN:  [id number]  Chief Complaint: ' I am here for follow up.' Chief Complaint    Follow-up     HPI: Kristen Pennington is an 85 year old Caucasian female, widowed, lives in [place], has a history of anxiety, depression, psychosis unspecified type, obstructive sleep apnea, vision loss, hearing loss, essential hypertension, presented to the clinic today for a follow-up visit.  Patient today presented along with her son Mr. Angelisa Winthrop as as well as daughter Ms.Cindee Eskridge.  Patient reports she feels happy.  She does have some mild problem keeping up with her bills as well as cooking at times.  She however reports it does not affect her on a daily basis.  She attributes it to growing old.  She reports she continues to have trouble with her computer however it is getting better.  She has more friends and she is happy at this new place where she is today.  She is tolerating her medications well.  She reports sleep is good.  Per collateral information obtained from son and daughter- patient started having problems with her computer 1-1/2 years ago.  Patient at that time was fixated that Jerold Coombe was on her computer and was changing her files on a regular basis.  Patient also reported losing her passwords and felt like the Jerold Coombe was changing her passwords on a daily basis.  Per daughter she was on medications like fentanyl and tramadol previously.  Her medications were stopped since her providers thought that could be causing her possible delusions.  Patient also had neurology work-up including CT scan of her brain which did not show any significant abnormalities.  Per daughter Christmas holidays of 2018 ,patient was clearly overwhelmed and fixated on her computer and could not enjoy the Christmas even though she spent it with her family.  This past Christmas ( 2019) however was different and family feels she is making some progress.  Patient  today reports she was able to hire someone who cleaned up her computer 3 weeks ago and that has helped a lot.  Patient reports she is not too overwhelmed or stressed out about the [place] anymore.  She feels things are improving.  Patient however today reports she is in pain.  She had a fall previously and ever since that she has  back pain.  Patient has an appointment with Dr. Humphrey Rolls this afternoon.  Discussed with her that she needs to talk to her primary medical doctor about the same.  Discussed abnormal thyroid test with patient today.  Printed out a copy of her labs and provided it to her.  Discussed with her to discuss her abnormal labs with her primary medical doctor since she may need further testing.   Visit Diagnosis:    ICD-10-CM   1. Generalized anxiety disorder F41.1 ARIPiprazole (ABILIFY) 2 MG tablet  2. MDD (major depressive disorder), recurrent episode, mild ([place]) F33.0   3. Psychosis, unspecified psychosis type ([place]) [facility]     Past Psychiatric History: Reviewed past psychiatric history from my progress note on 07/25/2018.  Past trials of Klonopin, Lexapro, Abilify, mirtazapine.  Past Medical History:  Past Medical History:  Diagnosis Date  . Arthritis   . Atrophic vaginitis 03/06/2013   Last Assessment & Plan:  She will plan to continue estradiol vaginal cream.  Prescription was rewritten stipulating use of the generic product.   . Atypical migraine 03/06/2013   Last Assessment & Plan:  Since she  rarely takes sumatriptan, we will discontinue it. Continue sparing use of OTC migraine products.   . Avitaminosis D 03/06/2013   Last Assessment & Plan:  Recheck vitamin D level. Plan to continue vitamin D supplementation.   . Ceratitis 08/01/2013   Last Assessment & Plan:  Because of the expense, she will discontinue Restasis. She will use over-the-counter moisturizing eyedrops and liquid gel.   . Colon polyp   . 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   . 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   . Cystocele, midline 03/06/2013  . Demoralization and apathy 03/06/2013   Last Assessment & Plan:  Since she has been taking bupropion only once a day, I will write the prescription with the correct instructions and correct quantity. She has done well on this for years and she is encouraged to take it regularly   . Depression   . Environmental allergies   . Epistaxis   . Essential (primary) hypertension 05/17/2013   Last Assessment & Plan:  Her blood pressure is well-controlled. We will plan to continue hydrochlorothiazide and benazepril.  Both are generic and should be affordable on her health plan.   Marland Kitchen GERD (gastroesophageal reflux disease)   . Hemorrhoid   . Inflammation of sacroiliac joint ([place]) 03/06/2013   Last Assessment & Plan:  She is doing well on her present regimen of fentanyl patch supplemented with sparing use of tramadol/acetaminophen. We will continue that regimen.   . Sinusitis   . Sleep apnea    CPAP    Past Surgical History:  Procedure Laterality Date  . COLONOSCOPY  05-03-15  . COLONOSCOPY WITH PROPOFOL N/A 05/03/2015   Procedure: COLONOSCOPY WITH PROPOFOL;  Surgeon: Lucilla Lame, MD;  Location: [facility];  Service: Endoscopy;  Laterality: N/A;  CPAP  . DILATION AND CURETTAGE OF UTERUS  1970  . ENDOMETRIAL BIOPSY    . EYE SURGERY Right 2017  . POLYPECTOMY  05/03/2015   Procedure: POLYPECTOMY;  Surgeon: Lucilla Lame, MD;  Location: [facility];  Service: Endoscopy;;  . ROTATOR CUFF REPAIR  2007  . SPINE SURGERY      Family Psychiatric History: Have reviewed family psychiatric history from my progress note on 07/25/2018.  Family History:  Family History  Problem Relation Age of Onset  . Arthritis Mother   . Alzheimer's disease Father   . Heart  disease Sister   . Cancer Brother   . Ovarian cancer Neg Hx   . Breast cancer Neg Hx   . Colon cancer Neg Hx   . Diabetes Neg Hx     Social History: Reviewed social history from my progress note on 07/25/2018. Social History   Socioeconomic History  . Marital status: Widowed    Spouse name: Not on file  . Number of children: 2  . Years of education: Not on file  . Highest education level: Some college, no degree  Occupational History  . Not on file  Social Needs  . Financial resource strain: Hard  . Food insecurity:    Worry: Often true    Inability: Often true  . Transportation needs:    Medical: No    Non-medical: No  Tobacco Use  . Smoking status: Never Smoker  . Smokeless tobacco: Never Used  Substance and Sexual Activity  . Alcohol use: No  Alcohol/week: 0.0 standard drinks  . Drug use: No  . Sexual activity: Not Currently  Lifestyle  . Physical activity:    Days per week: 0 days    Minutes per session: 0 min  . Stress: To some extent  Relationships  . Social connections:    Talks on phone: Not on file    Gets together: Not on file    Attends religious service: More than 4 times per year    Active member of club or organization: No    Attends meetings of clubs or organizations: Never    Relationship status: Widowed  Other Topics Concern  . Not on file  Social History Narrative  . Not on file    Allergies:  Allergies  Allergen Reactions  . Linaclotide Swelling    Metabolic Disorder Labs: Lab Results  Component Value Date   HGBA1C 5.4 08/01/2018   Lab Results  Component Value Date   PROLACTIN 8.1 08/01/2018   Lab Results  Component Value Date   CHOL 245 (H) 08/01/2018   TRIG 187 (H) 08/01/2018   HDL 49 08/01/2018   LDLCALC 159 (H) 08/01/2018   Lab Results  Component Value Date   TSH 5.550 (H) 08/01/2018   TSH 1.730 09/07/2017    Therapeutic Level Labs: No results found for: LITHIUM No results found for: VALPROATE No components  found for:  CBMZ  Current Medications: Current Outpatient Medications  Medication Sig Dispense Refill  . acetaminophen (TYLENOL) 500 MG tablet Take 500 mg by mouth every 6 (six) hours as needed.    . ARIPiprazole (ABILIFY) 2 MG tablet Take 0.5 tablets (1 mg total) by mouth daily. 30 tablet 1  . benazepril-hydrochlorthiazide (LOTENSIN HCT) 20-25 MG tablet Take by mouth.    . bisoprolol-hydrochlorothiazide (ZIAC) 2.5-6.25 MG tablet Take one tab po qd for bp 30 tablet 12  . Calcium Carb-Cholecalciferol 600-800 MG-UNIT TABS Take by mouth.    . Carboxymethylcellulose Sodium (THERATEARS OP) Apply to eye.    . Cholecalciferol (VITAMIN D3) 5000 units TABS Take 5,000 Units by mouth daily.    . clonazePAM (KLONOPIN) 0.5 MG tablet TAKE 1/2 TABLET BY MOUTH 3 TIMES DAILY AS NEEDED FOR ANXIETY OR PANICATTACK 45 tablet 3  . dicyclomine (BENTYL) 10 MG capsule Take 10 mg by mouth 4 (four) times daily -  before meals and at bedtime.    . docusate sodium (COLACE) 50 MG capsule Take 50 mg by mouth daily as needed for mild constipation. Mon, Wed, Fri    . escitalopram (LEXAPRO) 20 MG tablet Take 1 tablet (20 mg total) by mouth daily. 30 tablet 1  . estradiol (ESTRACE) 0.1 MG/GM vaginal cream Place vaginally as needed.     . fluticasone (FLONASE) 50 MCG/ACT nasal spray 1 SPRAY IN EACH NOSTRIL ONCE ADAY AS NEEDED 16 g 1  . ibuprofen (ADVIL,MOTRIN) 200 MG tablet Take 200 mg by mouth every 6 (six) hours as needed.    . Liniments (SALONPAS PAIN RELIEF PATCH EX) Apply 1 patch topically 2 (two) times daily.    Marland Kitchen lubiprostone (AMITIZA) 8 MCG capsule TAKE 1 CAPSULE BY MOUTH TWICE DAILY WITHA MEAL    . magnesium hydroxide (MILK OF MAGNESIA) 400 MG/5ML suspension Take by mouth.    . mirtazapine (REMERON) 15 MG tablet Take 1 tablet (15 mg total) by mouth at bedtime. 30 tablet 1  . Multiple Vitamins-Minerals (CENTRUM SILVER 50+WOMEN) TABS Take by mouth.    Marland Kitchen omeprazole (PRILOSEC) 40 MG capsule Take by mouth.    Marland Kitchen  oxybutynin  (DITROPAN-XL) 10 MG 24 hr tablet TAKE ONE TABLET EVERY DAY 30 tablet 3  . pantoprazole (PROTONIX) 40 MG tablet Take 1 tablet (40 mg total) by mouth daily. 90 tablet 1  . promethazine (PHENERGAN) 25 MG tablet Take by mouth.    . ranitidine (ZANTAC) 150 MG tablet Take by mouth.    Marland Kitchen rOPINIRole (REQUIP) 1 MG tablet TAKE ONE TABLET 3 TIMES DAILY AS NEEDED FOR RESTLESS LEGS 90 tablet 3  . shark liver oil-cocoa butter (PREPARATION H) 0.25-3-85.5 % suppository Place 1 suppository rectally as needed for hemorrhoids.    . shark liver oil-cocoa butter (PREPARATION H) 0.25-88.44 % suppository Place rectally.    . trolamine salicylate (ASPERCREME) 10 % cream Apply topically.     No current facility-administered medications for this visit.      Musculoskeletal: Strength & Muscle Tone: within normal limits Gait & Station: normal Patient leans: N/A  Psychiatric Specialty Exam: Review of Systems  Psychiatric/Behavioral: The patient is nervous/anxious.   All other systems reviewed and are negative.   Blood pressure (!) 189/74, pulse 72, temperature 98 F (36.7 C), temperature source Oral, weight 156 lb 12.8 oz (71.1 kg).Body mass index is 25.31 kg/m.  General Appearance: Casual  Eye Contact:  Fair  Speech:  Clear and Coherent  Volume:  Normal  Mood:  Anxious  Affect:  Appropriate  Thought Process:  Goal Directed and Descriptions of Associations: Intact  Orientation:  Full (Time, Place, and Person)  Thought Content: Rumination ,possible delusions , although improving  Suicidal Thoughts:  No  Homicidal Thoughts:  No  Memory:  Immediate;   Fair Recent;   Fair Remote;   Fair  Judgement:  Fair  Insight:  Fair  Psychomotor Activity:  Normal  Concentration:  Concentration: Fair and Attention Span: Fair  Recall:  [employer] of Knowledge: Fair  Language: Fair  Akathisia:  No  Handed:  Right  AIMS (if indicated):denies tremors, rigidity,stiffness  Assets:  Communication Skills Desire for  Improvement Social Support  ADL's:  Intact  Cognition: WNL  Sleep:  Fair   Screenings: Mini-Mental     Clinical Support from 03/02/2018 in [facility], [facility]  Total Score (max 30 points )  27    PHQ2-9     Office Visit from 06/30/2018 in [facility], [facility] Office Visit from 05/02/2018 in [facility], [place] from 03/02/2018 in [facility], [facility] Office Visit from 01/05/2018 in [facility], [facility] Office Visit from 10/05/2017 in [facility], [facility]  PHQ-2 Total Score  0  0  0  0  0       Assessment and Plan: Jacarra is an 85 year old Caucasian female, widowed, has a history of anxiety disorder, depression, psychosis unspecified, essential hypertension, chronic pain, OSA, vision loss, hearing loss, presented to clinic today for a follow-up visit.  Patient is biologically predisposed given her multiple medical problems.  She also has psychosocial stressors of being widowed.  Patient today reports improvement in her mood and does not seem to be too fixated on her possible delusions.  She is likely making progress.  Collateral information was obtained from her daughter and son who were present today.  Patient will continue to benefit from medication management.  Plan Generalized anxiety disorder-improving Mirtazapine 15 mg at bedtime Reduce Lexapro to 20 mg p.o. daily. Continue clonazepam as prescribed for now.  MDD-improving Mirtazapine as prescribed Abilify 1 mg p.o. daily.  Rule out psychosis unspecified We will  continue Abilify for now. Obtained collateral information from Mr. Diamond Jentz at [phone] and Ms. Dixon Boos  at [phone]-[employer] daughter who were present with patient during the evaluation today.  It is likely that patient may have had problems with her computer however she was clearly too fixated and obsessed about being hacked which affected her daily functioning and also affected her  mood.Hence likely delusional. Patient reports she had neurology work-up done and had CT scan of her brain previously-discussed with patient as well as her family members that she needs to sign a consent to obtain medical records from her neurologist.  Otherwise she will benefit from a neurology referral today.  Patient had recent labs done-TSH was high.  Discussed with patient that she will benefit from further testing of her T3 and T4 to make sure she does not have thyroid abnormality.  If she does have thyroid problems that may need to be corrected since thyroid problems can also affect her mood as well as cause psychosis.  This was discussed with patient as well as family members.  Printed out a copy of her lab.  She has upcoming appointment today with her primary medical doctor.  Vitamin B12-pending-patient will benefit from the same.  Lab Results  Component Value Date   TSH 5.550 (H) 08/01/2018    Pt also with elevated blood pressure- recommended to follow-up with PMD.  Follow-up in clinic in 1 month or sooner if needed.  I have spent atleast 25 minutes face to face with patient today. More than 50 % of the time was spent for psychoeducation and supportive psychotherapy and care coordination.  This note was generated in part or whole with voice recognition software. Voice recognition is usually quite accurate but there are transcription errors that can and very often do occur. I apologize for any typographical errors that were not detected and corrected.         Ursula Alert, MD 08/08/2018, 1:11 PM

## 2018-08-08 NOTE — Progress Notes (Signed)
[facility] [place], [place] [address]  Internal MEDICINE  Office Visit Note  Patient Name: Kristen Pennington  [id number]  [id number]  Date of Service: 08/17/2018  Chief Complaint  Patient presents with  . Hypertension  . Hyperlipidemia  . Hip Pain    The patient is c/o low back pain which is intermittent. When she walks, the pain will alternate from hip to hip as she transfers her weight back and forth. Did have surgery many years ago. Then had a fall about 5 years ago, which she impacted the ground on her back, jarring her lumbar spine. The pain has been present since she fell that last time five years ago. She is afraid she may have damaged her surgical patch which was originally placed.  The patient is also reporting that she has abdominal distension and excess gas every morning after she eats and takes her morning medication. She is taking amitiza in the morning and this seems to be the medication which causes her the most trouble.  She saw psychiatrist this morning. Medications were adjusted and updated in her medication list. They have asked for additional labs to be drawn. TSH was a little elevated and no anemia panel was drawn.       Current Medication: Outpatient Encounter Medications as of 08/08/2018  Medication Sig Note  . acetaminophen (TYLENOL) 500 MG tablet Take 500 mg by mouth every 6 (six) hours as needed.   . ARIPiprazole (ABILIFY) 2 MG tablet Take 0.5 tablets (1 mg total) by mouth daily.   . benazepril-hydrochlorthiazide (LOTENSIN HCT) 20-25 MG tablet Take by mouth.   . bisoprolol-hydrochlorothiazide (ZIAC) 2.5-6.25 MG tablet Take one tab po qd for bp   . Calcium Carb-Cholecalciferol 600-800 MG-UNIT TABS Take by mouth.   . Carboxymethylcellulose Sodium (THERATEARS OP) Apply to eye.   . Cholecalciferol (VITAMIN D3) 5000 units TABS Take 5,000 Units by mouth daily.   . clonazePAM (KLONOPIN) 0.5 MG tablet TAKE 1/2 TABLET BY MOUTH 3 TIMES DAILY  AS NEEDED FOR ANXIETY OR PANICATTACK   . dicyclomine (BENTYL) 10 MG capsule Take 10 mg by mouth 4 (four) times daily -  before meals and at bedtime.   . docusate sodium (COLACE) 50 MG capsule Take 50 mg by mouth daily as needed for mild constipation. Mon, Wed, Fri   . escitalopram (LEXAPRO) 20 MG tablet Take 1 tablet (20 mg total) by mouth daily.   Marland Kitchen estradiol (ESTRACE) 0.1 MG/GM vaginal cream Place vaginally as needed.  03/26/2015: Received from: [facility]  . fluticasone (FLONASE) 50 MCG/ACT nasal spray 1 SPRAY IN EACH NOSTRIL ONCE ADAY AS NEEDED   . ibuprofen (ADVIL,MOTRIN) 200 MG tablet Take 200 mg by mouth every 6 (six) hours as needed.   . Liniments (SALONPAS PAIN RELIEF PATCH EX) Apply 1 patch topically 2 (two) times daily.   Marland Kitchen lubiprostone (AMITIZA) 8 MCG capsule TAKE 1 CAPSULE BY MOUTH TWICE DAILY WITHA MEAL 04/08/2016: Received from: [facility]  . magnesium hydroxide (MILK OF MAGNESIA) 400 MG/5ML suspension Take by mouth.   . mirtazapine (REMERON) 15 MG tablet Take 1 tablet (15 mg total) by mouth at bedtime.   . Multiple Vitamins-Minerals (CENTRUM SILVER 50+WOMEN) TABS Take by mouth.   . oxybutynin (DITROPAN-XL) 10 MG 24 hr tablet TAKE ONE TABLET EVERY DAY   . pantoprazole (PROTONIX) 40 MG tablet Take 1 tablet (40 mg total) by mouth daily.   . promethazine (PHENERGAN) 25 MG tablet Take by mouth.   Marland Kitchen  ranitidine (ZANTAC) 150 MG tablet Take by mouth.   Marland Kitchen rOPINIRole (REQUIP) 1 MG tablet TAKE ONE TABLET 3 TIMES DAILY AS NEEDED FOR RESTLESS LEGS   . shark liver oil-cocoa butter (PREPARATION H) 0.25-3-85.5 % suppository Place 1 suppository rectally as needed for hemorrhoids. 05/03/2015: PRN  . shark liver oil-cocoa butter (PREPARATION H) 0.25-88.44 % suppository Place rectally.   . trolamine salicylate (ASPERCREME) 10 % cream Apply topically. 04/08/2016: Received from: [facility]: Apply topically as needed.  Marland Kitchen omeprazole (PRILOSEC) 40 MG  capsule Take by mouth.    No facility-administered encounter medications on file as of 08/08/2018.     Surgical History: Past Surgical History:  Procedure Laterality Date  . COLONOSCOPY  05-03-15  . COLONOSCOPY WITH PROPOFOL N/A 05/03/2015   Procedure: COLONOSCOPY WITH PROPOFOL;  Surgeon: Lucilla Lame, MD;  Location: [facility];  Service: Endoscopy;  Laterality: N/A;  CPAP  . DILATION AND CURETTAGE OF UTERUS  1970  . ENDOMETRIAL BIOPSY    . EYE SURGERY Right 2017  . POLYPECTOMY  05/03/2015   Procedure: POLYPECTOMY;  Surgeon: Lucilla Lame, MD;  Location: [facility];  Service: Endoscopy;;  . ROTATOR CUFF REPAIR  2007  . SPINE SURGERY      Medical History: Past Medical History:  Diagnosis Date  . Arthritis   . Atrophic vaginitis 03/06/2013   Last Assessment & Plan:  She will plan to continue estradiol vaginal cream.  Prescription was rewritten stipulating use of the generic product.   . Atypical migraine 03/06/2013   Last Assessment & Plan:  Since she rarely takes sumatriptan, we will discontinue it. Continue sparing use of OTC migraine products.   . Avitaminosis D 03/06/2013   Last Assessment & Plan:  Recheck vitamin D level. Plan to continue vitamin D supplementation.   . Ceratitis 08/01/2013   Last Assessment & Plan:  Because of the expense, she will discontinue Restasis. She will use over-the-counter moisturizing eyedrops and liquid gel.   . Colon polyp   . 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   . 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   . Cystocele, midline 03/06/2013  . Demoralization and apathy 03/06/2013   Last Assessment & Plan:  Since she has been taking bupropion only once a day, I will write the prescription with the correct instructions and correct quantity. She has done  well on this for years and she is encouraged to take it regularly   . Depression   . Environmental allergies   . Epistaxis   . Essential (primary) hypertension 05/17/2013   Last Assessment & Plan:  Her blood pressure is well-controlled. We will plan to continue hydrochlorothiazide and benazepril.  Both are generic and should be affordable on her health plan.   Marland Kitchen GERD (gastroesophageal reflux disease)   . Hemorrhoid   . Inflammation of sacroiliac joint ([place]) 03/06/2013   Last Assessment & Plan:  She is doing well on her present regimen of fentanyl patch supplemented with sparing use of tramadol/acetaminophen. We will continue that regimen.   . Sinusitis   . Sleep apnea    CPAP    Family History: Family History  Problem Relation Age of Onset  . Arthritis Mother   . Alzheimer's disease Father   . Heart disease Sister   . Cancer Brother   .  Ovarian cancer Neg Hx   . Breast cancer Neg Hx   . Colon cancer Neg Hx   . Diabetes Neg Hx     Social History   Socioeconomic History  . Marital status: Widowed    Spouse name: Not on file  . Number of children: 2  . Years of education: Not on file  . Highest education level: Some college, no degree  Occupational History  . Not on file  Social Needs  . Financial resource strain: Hard  . Food insecurity:    Worry: Often true    Inability: Often true  . Transportation needs:    Medical: No    Non-medical: No  Tobacco Use  . Smoking status: Never Smoker  . Smokeless tobacco: Never Used  Substance and Sexual Activity  . Alcohol use: No    Alcohol/week: 0.0 standard drinks  . Drug use: No  . Sexual activity: Not Currently  Lifestyle  . Physical activity:    Days per week: 0 days    Minutes per session: 0 min  . Stress: To some extent  Relationships  . Social connections:    Talks on phone: Not on file    Gets together: Not on file    Attends religious service: More than 4 times per year    Active member of club or organization:  No    Attends meetings of clubs or organizations: Never    Relationship status: Widowed  . Intimate partner violence:    Fear of current or ex partner: No    Emotionally abused: No    Physically abused: No    Forced sexual activity: No  Other Topics Concern  . Not on file  Social History Narrative  . Not on file      Review of Systems  Constitutional: Negative for activity change, chills, fatigue and unexpected weight change.  HENT: Negative for congestion, rhinorrhea, sneezing and sore throat.   Respiratory: Negative for cough, chest tightness, shortness of breath and wheezing.   Cardiovascular: Negative for chest pain and palpitations.  Gastrointestinal: Negative for abdominal pain, constipation, diarrhea, nausea and vomiting.  Endocrine: Negative for cold intolerance, heat intolerance, polydipsia and polyuria.       Elevated TSH with most recent lab draw.   Musculoskeletal: Positive for arthralgias, back pain, gait problem and myalgias. Negative for joint swelling and neck pain.  Skin: Negative for rash.  Allergic/Immunologic: Positive for environmental allergies.  Neurological: Positive for dizziness, weakness and headaches. Negative for tremors and numbness.  Hematological: Negative for adenopathy. Does not bruise/bleed easily.  Psychiatric/Behavioral: Positive for dysphoric mood. Negative for behavioral problems and sleep disturbance. The patient is nervous/anxious.        Patient seeing psychiatrist regularly.    Today's Vitals   08/08/18 1422  BP: 130/80  Pulse: 72  Resp: 16  SpO2: 95%  Weight: 158 lb (71.7 kg)  Height: 5\' 6"  (1.676 m)    Physical Exam Vitals signs and nursing note reviewed.  Constitutional:      General: She is not in acute distress.    Appearance: Normal appearance. She is well-developed. She is not diaphoretic.  HENT:     Head: Normocephalic and atraumatic.     Mouth/Throat:     Pharynx: No oropharyngeal exudate.  Eyes:     Pupils:  Pupils are equal, round, and reactive to light.  Neck:     Musculoskeletal: Normal range of motion and neck supple. No muscular tenderness.  Thyroid: No thyromegaly.     Vascular: No carotid bruit or JVD.     Trachea: No tracheal deviation.  Cardiovascular:     Rate and Rhythm: Normal rate and regular rhythm.     Heart sounds: Normal heart sounds. No murmur. No friction rub. No gallop.   Pulmonary:     Effort: Pulmonary effort is normal. No respiratory distress.     Breath sounds: Normal breath sounds. No wheezing or rales.  Chest:     Chest wall: No tenderness.  Abdominal:     Palpations: Abdomen is soft.     Tenderness: There is no abdominal tenderness. There is no guarding.  Musculoskeletal: Normal range of motion.     Comments: Moderate lower back pain which is most severe when walking. Pain radiations into both hips and legs. No bruising or swelling or palpable deformities noted today.   Lymphadenopathy:     Cervical: No cervical adenopathy.  Skin:    General: Skin is warm and dry.  Neurological:     Mental Status: She is alert and oriented to person, place, and time. Mental status is at baseline.     Cranial Nerves: No cranial nerve deficit.  Psychiatric:        Behavior: Behavior normal.        Thought Content: Thought content normal.        Judgment: Judgment normal.    Assessment/Plan: 1. Chronic bilateral low back pain without sciatica Will x-ray the lumbar spine for further evaluation. Refer to ortho/PT as indicated.  - DG Lumbar Spine Complete; Future  2. Vitamin B12 deficiency Check labs with full anemia panel. Treat as indicated.   3. Elevated TSH Check full thyroid panel. Treatment as indicated.   4. Essential (primary) hypertension Stable.   General Counseling: bonni neuser understanding of the findings of todays visit and agrees with plan of treatment. I have discussed any further diagnostic evaluation that may be needed or ordered today. We also  reviewed her medications today. she has been encouraged to call the office with any questions or concerns that should arise related to todays visit.  This patient was seen by Leretha Pol FNP Collaboration with Dr Lavera Guise as a part of collaborative care agreement  Orders Placed This Encounter  Procedures  . DG Lumbar Spine Complete     Time spent: 23 Minutes      Dr Lavera Guise Internal medicine

## 2018-08-10 ENCOUNTER — Other Ambulatory Visit: Payer: Self-pay | Admitting: Nurse Practitioner

## 2018-08-10 DIAGNOSIS — D691 Qualitative platelet defects: Secondary | ICD-10-CM | POA: Diagnosis not present

## 2018-08-10 DIAGNOSIS — E559 Vitamin D deficiency, unspecified: Secondary | ICD-10-CM | POA: Diagnosis not present

## 2018-08-10 DIAGNOSIS — E538 Deficiency of other specified B group vitamins: Secondary | ICD-10-CM | POA: Diagnosis not present

## 2018-08-10 DIAGNOSIS — E611 Iron deficiency: Secondary | ICD-10-CM | POA: Diagnosis not present

## 2018-08-10 DIAGNOSIS — T600X1A Toxic effect of organophosphate and carbamate insecticides, accidental (unintentional), initial encounter: Secondary | ICD-10-CM | POA: Diagnosis not present

## 2018-08-10 DIAGNOSIS — E039 Hypothyroidism, unspecified: Secondary | ICD-10-CM | POA: Diagnosis not present

## 2018-08-11 ENCOUNTER — Ambulatory Visit
Admission: RE | Admit: 2018-08-11 | Discharge: 2018-08-11 | Disposition: A | Discharge status: Home / Self Care | Payer: Medicare HMO | Source: Ambulatory Visit | Attending: Nurse Practitioner | Admitting: Nurse Practitioner

## 2018-08-11 ENCOUNTER — Ambulatory Visit
Admission: RE | Admit: 2018-08-11 | Discharge: 2018-08-11 | Disposition: A | Discharge status: Home / Self Care | Payer: Medicare HMO | Attending: Nurse Practitioner | Admitting: Nurse Practitioner

## 2018-08-11 DIAGNOSIS — G8929 Other chronic pain: Secondary | ICD-10-CM

## 2018-08-11 DIAGNOSIS — M545 Low back pain, unspecified: Secondary | ICD-10-CM

## 2018-08-11 DIAGNOSIS — H353131 Nonexudative age-related macular degeneration, bilateral, early dry stage: Secondary | ICD-10-CM | POA: Diagnosis not present

## 2018-08-11 LAB — BASIC METABOLIC PANEL
BUN/Creatinine Ratio: 18 (ref 12–28)
BUN: 22 mg/dL (ref 8–27)
CO2: 27 mmol/L (ref 20–29)
CREATININE: 1.21 mg/dL — AB (ref 0.57–1.00)
Calcium: 10.3 mg/dL (ref 8.7–10.3)
Chloride: 101 mmol/L (ref 96–106)
GFR calc Af Amer: 46 mL/min/{1.73_m2} — ABNORMAL LOW (ref >59)
GFR calc non Af Amer: 40 mL/min/{1.73_m2} — ABNORMAL LOW (ref >59)
GLUCOSE: 93 mg/dL (ref 65–99)
Potassium: 5.6 mmol/L — ABNORMAL HIGH (ref 3.5–5.2)
SODIUM: 143 mmol/L (ref 134–144)

## 2018-08-11 LAB — VITAMIN D 25 HYDROXY (VIT D DEFICIENCY, FRACTURES): Vit D, 25-Hydroxy: 57.1 ng/mL (ref 30.0–100.0)

## 2018-08-11 LAB — B12 AND FOLATE PANEL
Folate: >20 ng/mL (ref >3.0)
Vitamin B-12: 761 pg/mL (ref 232–1245)

## 2018-08-11 LAB — CBC
Hematocrit: 40.3 % (ref 34.0–46.6)
Hemoglobin: 13.4 g/dL (ref 11.1–15.9)
MCH: 31.5 pg (ref 26.6–33.0)
MCHC: 33.3 g/dL (ref 31.5–35.7)
MCV: 95 fL (ref 79–97)
Platelets: 244 10*3/uL (ref 150–450)
RBC: 4.26 x10E6/uL (ref 3.77–5.28)
RDW: 12.7 % (ref 11.7–15.4)
WBC: 7.1 10*3/uL (ref 3.4–10.8)

## 2018-08-11 LAB — TSH: TSH: 2.83 u[IU]/mL (ref 0.450–4.500)

## 2018-08-11 LAB — IRON AND TIBC
Iron Saturation: 42 % (ref 15–55)
Iron: 137 ug/dL (ref 27–139)
TIBC: 323 ug/dL (ref 250–450)
UIBC: 186 ug/dL (ref 118–369)

## 2018-08-11 LAB — FERRITIN: Ferritin: 94 ng/mL (ref 15–150)

## 2018-08-11 LAB — T3: T3, Total: 83 ng/dL (ref 71–180)

## 2018-08-11 LAB — T4, FREE: Free T4: 0.94 ng/dL (ref 0.82–1.77)

## 2018-08-17 DIAGNOSIS — G8929 Other chronic pain: Secondary | ICD-10-CM | POA: Insufficient documentation

## 2018-08-17 DIAGNOSIS — E538 Deficiency of other specified B group vitamins: Secondary | ICD-10-CM | POA: Insufficient documentation

## 2018-08-17 DIAGNOSIS — M545 Low back pain, unspecified: Secondary | ICD-10-CM | POA: Insufficient documentation

## 2018-08-17 DIAGNOSIS — R7989 Other specified abnormal findings of blood chemistry: Secondary | ICD-10-CM | POA: Insufficient documentation

## 2018-08-25 DIAGNOSIS — H16223 Keratoconjunctivitis sicca, not specified as Sjogren's, bilateral: Secondary | ICD-10-CM | POA: Diagnosis not present

## 2018-09-01 ENCOUNTER — Other Ambulatory Visit: Payer: Self-pay | Admitting: Adult Health

## 2018-09-01 ENCOUNTER — Other Ambulatory Visit: Payer: Self-pay

## 2018-09-01 ENCOUNTER — Other Ambulatory Visit: Payer: Self-pay | Admitting: Internal Medicine

## 2018-09-01 MED ORDER — ROPINIROLE HCL 1 MG PO TABS: ORAL_TABLET | ORAL | 3 refills | Status: DC

## 2018-09-01 MED ORDER — FLUTICASONE PROPIONATE 50 MCG/ACT NA SUSP: NASAL | 1 refills | Status: DC

## 2018-09-02 ENCOUNTER — Other Ambulatory Visit: Payer: Self-pay | Admitting: Adult Health

## 2018-09-02 NOTE — Telephone Encounter (Signed)
Can you please fill next appointment is 11/08/18

## 2018-09-08 ENCOUNTER — Other Ambulatory Visit: Payer: Self-pay

## 2018-09-08 ENCOUNTER — Ambulatory Visit: Payer: Medicare HMO | Admitting: Psychiatry

## 2018-09-08 ENCOUNTER — Encounter: Payer: Self-pay | Admitting: Psychiatry

## 2018-09-08 VITALS — BP 169/77 | HR 73 | Temp 98.3°F | Wt 159.6 lb

## 2018-09-08 DIAGNOSIS — Z8659 Personal history of other mental and behavioral disorders: Secondary | ICD-10-CM | POA: Diagnosis not present

## 2018-09-08 DIAGNOSIS — R69 Illness, unspecified: Secondary | ICD-10-CM | POA: Diagnosis not present

## 2018-09-08 DIAGNOSIS — F330 Major depressive disorder, recurrent, mild: Secondary | ICD-10-CM

## 2018-09-08 DIAGNOSIS — F411 Generalized anxiety disorder: Secondary | ICD-10-CM

## 2018-09-08 MED ORDER — ARIPIPRAZOLE 2 MG PO TABS: 1.0000 mg | ORAL_TABLET | Freq: Every day | ORAL | 0 refills | Status: DC

## 2018-09-08 MED ORDER — MIRTAZAPINE 15 MG PO TABS: 15.0000 mg | ORAL_TABLET | Freq: Every day | ORAL | 0 refills | Status: DC

## 2018-09-08 MED ORDER — ESCITALOPRAM OXALATE 20 MG PO TABS: 20.0000 mg | ORAL_TABLET | Freq: Every day | ORAL | 0 refills | Status: DC

## 2018-09-08 NOTE — Progress Notes (Signed)
[facility] MD OP Progress Note  09/08/2018 11:23 AM Kristen Pennington  MRN:  [id number]  Chief Complaint: ' I am here for follow up.' Chief Complaint    Follow-up; Medication Refill     HPI: Kristen Pennington is an 85 yr old Caucasian female, widowed, lives in [place], has a history of anxiety, depression, psychosis unspecified type, OSA, vision loss, hearing loss, essential hypertension, presented to clinic today for a follow-up visit.  Patient today reports she is currently doing well with regards to her mood symptoms.  She denies any sleep problems.  She reports she is compliant on her medications.  She denies any side effects.  Patient is alert, oriented to person place time and situation.  She was able to give information accurately.  She was able to answer all my questions.  Patient denies any perceptual disturbances and today does not seem to be preoccupied with any delusions.  Patient remains active at the retirement facility that she lives at.  She does have a few good friends and she enjoys being there.  Visit Diagnosis:    ICD-10-CM   1. Generalized anxiety disorder F41.1 ARIPiprazole (ABILIFY) 2 MG tablet    escitalopram (LEXAPRO) 20 MG tablet    mirtazapine (REMERON) 15 MG tablet  2. MDD (major depressive disorder), recurrent episode, mild (HCC) F33.0 escitalopram (LEXAPRO) 20 MG tablet    mirtazapine (REMERON) 15 MG tablet  3. History of psychosis Z86.59     Past Psychiatric History: Reviewed past psychiatric history from my progress note on 07/25/2018.  Past trials of Klonopin, Lexapro, Abilify, mirtazapine  Past Medical History:  Past Medical History:  Diagnosis Date  . Arthritis   . Atrophic vaginitis 03/06/2013   Last Assessment & Plan:  She will plan to continue estradiol vaginal cream.  Prescription was rewritten stipulating use of the generic product.   . Atypical migraine 03/06/2013   Last Assessment & Plan:  Since she rarely takes sumatriptan, we will discontinue it.  Continue sparing use of OTC migraine products.   . Avitaminosis D 03/06/2013   Last Assessment & Plan:  Recheck vitamin D level. Plan to continue vitamin D supplementation.   . Ceratitis 08/01/2013   Last Assessment & Plan:  Because of the expense, she will discontinue Restasis. She will use over-the-counter moisturizing eyedrops and liquid gel.   . Colon polyp   . 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   . 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   . Cystocele, midline 03/06/2013  . Demoralization and apathy 03/06/2013   Last Assessment & Plan:  Since she has been taking bupropion only once a day, I will write the prescription with the correct instructions and correct quantity. She has done well on this for years and she is encouraged to take it regularly   . Depression   . Environmental allergies   . Epistaxis   . Essential (primary) hypertension 05/17/2013   Last Assessment & Plan:  Her blood pressure is well-controlled. We will plan to continue hydrochlorothiazide and benazepril.  Both are generic and should be affordable on her health plan.   Marland Kitchen GERD (gastroesophageal reflux disease)   . Hemorrhoid   . Inflammation of sacroiliac joint ([place]) 03/06/2013   Last Assessment & Plan:  She is doing well on her present regimen of fentanyl patch supplemented with  sparing use of tramadol/acetaminophen. We will continue that regimen.   . Sinusitis   . Sleep apnea    CPAP    Past Surgical History:  Procedure Laterality Date  . COLONOSCOPY  05-03-15  . COLONOSCOPY WITH PROPOFOL N/A 05/03/2015   Procedure: COLONOSCOPY WITH PROPOFOL;  Surgeon: Lucilla Lame, MD;  Location: [facility];  Service: Endoscopy;  Laterality: N/A;  CPAP  . DILATION AND CURETTAGE OF UTERUS  1970  . ENDOMETRIAL BIOPSY    . EYE SURGERY Right  2017  . POLYPECTOMY  05/03/2015   Procedure: POLYPECTOMY;  Surgeon: Lucilla Lame, MD;  Location: [facility];  Service: Endoscopy;;  . ROTATOR CUFF REPAIR  2007  . SPINE SURGERY      Family Psychiatric History: Reviewed family psychiatric history from my progress note on 07/25/2018  Family History:  Family History  Problem Relation Age of Onset  . Arthritis Mother   . Alzheimer's disease Father   . Heart disease Sister   . Cancer Brother   . Ovarian cancer Neg Hx   . Breast cancer Neg Hx   . Colon cancer Neg Hx   . Diabetes Neg Hx     Social History: Reviewed social history from my progress note on 07/25/2018. Social History   Socioeconomic History  . Marital status: Widowed    Spouse name: Not on file  . Number of children: 2  . Years of education: Not on file  . Highest education level: Some college, no degree  Occupational History  . Not on file  Social Needs  . Financial resource strain: Hard  . Food insecurity:    Worry: Often true    Inability: Often true  . Transportation needs:    Medical: No    Non-medical: No  Tobacco Use  . Smoking status: Never Smoker  . Smokeless tobacco: Never Used  Substance and Sexual Activity  . Alcohol use: No    Alcohol/week: 0.0 standard drinks  . Drug use: No  . Sexual activity: Not Currently  Lifestyle  . Physical activity:    Days per week: 0 days    Minutes per session: 0 min  . Stress: To some extent  Relationships  . Social connections:    Talks on phone: Not on file    Gets together: Not on file    Attends religious service: More than 4 times per year    Active member of club or organization: No    Attends meetings of clubs or organizations: Never    Relationship status: Widowed  Other Topics Concern  . Not on file  Social History Narrative  . Not on file    Allergies:  Allergies  Allergen Reactions  . Linaclotide Swelling    Metabolic Disorder Labs: Lab Results  Component Value Date   HGBA1C  5.4 08/01/2018   Lab Results  Component Value Date   PROLACTIN 8.1 08/01/2018   Lab Results  Component Value Date   CHOL 245 (H) 08/01/2018   TRIG 187 (H) 08/01/2018   HDL 49 08/01/2018   LDLCALC 159 (H) 08/01/2018   Lab Results  Component Value Date   TSH 2.830 08/10/2018   TSH 5.550 (H) 08/01/2018    Therapeutic Level Labs: No results found for: LITHIUM No results found for: VALPROATE No components found for:  CBMZ  Current Medications: Current Outpatient Medications  Medication Sig Dispense Refill  . acetaminophen (TYLENOL) 500 MG tablet Take 500 mg by mouth every 6 (six) hours as needed.    Marland Kitchen  ARIPiprazole (ABILIFY) 2 MG tablet Take 0.5 tablets (1 mg total) by mouth daily. 45 tablet 0  . benazepril-hydrochlorthiazide (LOTENSIN HCT) 20-25 MG tablet Take by mouth.    . bisoprolol-hydrochlorothiazide (ZIAC) 2.5-6.25 MG tablet Take one tab po qd for bp 30 tablet 12  . Calcium Carb-Cholecalciferol 600-800 MG-UNIT TABS Take by mouth.    . Carboxymethylcellulose Sodium (THERATEARS OP) Apply to eye.    . Cholecalciferol (VITAMIN D3) 5000 units TABS Take 5,000 Units by mouth daily.    . clonazePAM (KLONOPIN) 0.5 MG tablet TAKE 1/2 TABLET BY MOUTH 3 TIMES DAILY AS NEEDED FOR ANXIETY OR PANICATTACK 45 tablet 3  . dicyclomine (BENTYL) 10 MG capsule Take 10 mg by mouth 4 (four) times daily -  before meals and at bedtime.    . docusate sodium (COLACE) 50 MG capsule Take 50 mg by mouth daily as needed for mild constipation. Mon, Wed, Fri    . escitalopram (LEXAPRO) 20 MG tablet Take 1 tablet (20 mg total) by mouth daily. 90 tablet 0  . estradiol (ESTRACE) 0.1 MG/GM vaginal cream Place vaginally as needed.     . fluticasone (FLONASE) 50 MCG/ACT nasal spray 1 SPRAY IN EACH NOSTRIL ONCE ADAY AS NEEDED 16 g 1  . ibuprofen (ADVIL,MOTRIN) 200 MG tablet Take 200 mg by mouth every 6 (six) hours as needed.    Marland Kitchen Lifitegrast (XIIDRA) 5 % SOLN Apply to eye.    . Liniments (SALONPAS PAIN RELIEF  PATCH EX) Apply 1 patch topically 2 (two) times daily.    Marland Kitchen lubiprostone (AMITIZA) 8 MCG capsule TAKE 1 CAPSULE BY MOUTH TWICE DAILY WITHA MEAL    . magnesium hydroxide (MILK OF MAGNESIA) 400 MG/5ML suspension Take by mouth.    . mirtazapine (REMERON) 15 MG tablet Take 1 tablet (15 mg total) by mouth at bedtime. 90 tablet 0  . Multiple Vitamins-Minerals (CENTRUM SILVER 50+WOMEN) TABS Take by mouth.    Marland Kitchen omeprazole (PRILOSEC) 40 MG capsule Take by mouth.    . oxybutynin (DITROPAN-XL) 10 MG 24 hr tablet TAKE 1 TABLET BY MOUTH DAILY 30 tablet 3  . pantoprazole (PROTONIX) 40 MG tablet Take 1 tablet (40 mg total) by mouth daily. 90 tablet 1  . promethazine (PHENERGAN) 25 MG tablet Take by mouth.    . ranitidine (ZANTAC) 150 MG tablet Take by mouth.    Marland Kitchen rOPINIRole (REQUIP) 1 MG tablet TAKE ONE TABLET 3 TIMES DAILY AS NEEDED FOR RESTLESS LEGS 90 tablet 3  . shark liver oil-cocoa butter (PREPARATION H) 0.25-3-85.5 % suppository Place 1 suppository rectally as needed for hemorrhoids.    . shark liver oil-cocoa butter (PREPARATION H) 0.25-88.44 % suppository Place rectally.    . trolamine salicylate (ASPERCREME) 10 % cream Apply topically.     No current facility-administered medications for this visit.      Musculoskeletal: Strength & Muscle Tone: within normal limits Gait & Station: normal Patient leans: N/A  Psychiatric Specialty Exam: Review of Systems  Psychiatric/Behavioral: Negative for depression. The patient is not nervous/anxious.   All other systems reviewed and are negative.   Blood pressure (!) 169/77, pulse 73, temperature 98.3 F (36.8 C), temperature source Oral, weight 159 lb 9.6 oz (72.4 kg).Body mass index is 25.76 kg/m.  General Appearance: Casual  Eye Contact:  Fair  Speech:  Clear and Coherent  Volume:  Normal  Mood:  Euthymic  Affect:  Congruent  Thought Process:  Goal Directed and Descriptions of Associations: Intact  Orientation:  Full (Time, Place, and  Person)   Thought Content: Logical   Suicidal Thoughts:  No  Homicidal Thoughts:  No  Memory:  Immediate;   Fair Recent;   Fair Remote;   Fair  Judgement:  Fair  Insight:  Fair  Psychomotor Activity:  Normal  Concentration:  Concentration: Fair and Attention Span: Fair  Recall:  [employer] of Knowledge: Fair  Language: Fair  Akathisia:  No  Handed:  Right  AIMS (if indicated): denies tremors, rigidity,stiffness  Assets:  Communication Skills Desire for Improvement Social Support  ADL's:  Intact  Cognition: WNL  Sleep:  Fair   Screenings: Mini-Mental     Clinical Support from 03/02/2018 in [facility], [facility]  Total Score (max 30 points )  27    PHQ2-9     Office Visit from 08/08/2018 in [facility], [facility] Office Visit from 06/30/2018 in [facility], [facility] Office Visit from 05/02/2018 in [facility], [place] from 03/02/2018 in [facility], [facility] Office Visit from 01/05/2018 in [facility], [facility]  PHQ-2 Total Score  1  0  0  0  0       Assessment and Plan: Kristen Pennington is an 85 yr old patient female, widowed, has a history of anxiety disorder, depression, history of psychosis unspecified, essential hypertension, chronic pain, OSA, vision loss, hearing loss, presented to clinic today for a follow-up visit.  Patient has psychosocial stressors of being widowed.  Patient however is currently making progress on the current medication regimen.  Plan Generalized anxiety disorder-improving Mirtazapine 15 mg at bedtime Lexapro 20 mg p.o. daily, reduced dosage. Continue clonazepam as prescribed by her primary medical doctor for now.   For MDD-improving Mirtazapine as prescribed Abilify 1 mg p.o. daily  Psychosis-unspecified-resolved Patient is not preoccupied with any delusions at this time.  She denies any other perceptual disturbances We will continue Abilify at low dosage at this time.   Patient with elevated  blood pressure reading  will continue to follow-up with her primary medical doctor  Follow-up in clinic in 3 months or sooner if needed.  I have spent atleast 15 minutes  face to face with patient today. More than 50 % of the time was spent for psychoeducation and supportive psychotherapy and care coordination.  This note was generated in part or whole with voice recognition software. Voice recognition is usually quite accurate but there are transcription errors that can and very often do occur. I apologize for any typographical errors that were not detected and corrected.       Ursula Alert, MD 09/09/2018, 8:26 AM

## 2018-09-09 ENCOUNTER — Encounter: Payer: Self-pay | Admitting: Psychiatry

## 2018-09-29 ENCOUNTER — Other Ambulatory Visit: Payer: Self-pay | Admitting: Adult Health

## 2018-09-29 ENCOUNTER — Other Ambulatory Visit: Payer: Self-pay

## 2018-09-29 MED ORDER — PANTOPRAZOLE SODIUM 40 MG PO TBEC: 40.0000 mg | DELAYED_RELEASE_TABLET | Freq: Every day | ORAL | 1 refills | Status: DC

## 2018-10-08 ENCOUNTER — Other Ambulatory Visit: Payer: Self-pay | Admitting: Adult Health

## 2018-10-12 ENCOUNTER — Other Ambulatory Visit: Payer: Self-pay | Admitting: Adult Health

## 2018-10-13 ENCOUNTER — Other Ambulatory Visit: Payer: Self-pay | Admitting: Adult Health

## 2018-10-14 ENCOUNTER — Other Ambulatory Visit: Payer: Self-pay | Admitting: Adult Health

## 2018-10-14 ENCOUNTER — Telehealth: Payer: Self-pay

## 2018-10-14 NOTE — Telephone Encounter (Signed)
Pt called wanting a refill on her clonazepam. Spoke with Leretha Pol and we do not prescribe that to her anymore, her psychiatrist does. Called pt and notified her that psych prescribes this to her and to call them. Pt did not have psych info. Gave pt phone number and doctor name.

## 2018-10-17 ENCOUNTER — Telehealth: Payer: Self-pay | Admitting: Psychiatry

## 2018-10-17 ENCOUNTER — Encounter: Payer: Self-pay | Admitting: Psychiatry

## 2018-10-17 ENCOUNTER — Telehealth: Payer: Self-pay

## 2018-10-17 ENCOUNTER — Ambulatory Visit: Payer: Self-pay | Admitting: Adult Health

## 2018-10-17 ENCOUNTER — Ambulatory Visit: Payer: Medicare HMO | Admitting: Psychiatry

## 2018-10-17 DIAGNOSIS — F411 Generalized anxiety disorder: Secondary | ICD-10-CM

## 2018-10-17 MED ORDER — CLONAZEPAM 0.5 MG PO TABS: 0.2500 mg | ORAL_TABLET | Freq: Two times a day (BID) | ORAL | 0 refills | Status: DC | PRN

## 2018-10-17 NOTE — Telephone Encounter (Signed)
pt was made an appt for this afternoon but you wanted it r/s so it was put on schedule for tomorrow. pt was called and expland pt wanted to know if you would call in her klonipin.

## 2018-10-17 NOTE — Telephone Encounter (Signed)
Sent Klonopin reduced dosage to pharmacy - will taper off gradually

## 2018-10-18 ENCOUNTER — Other Ambulatory Visit: Payer: Self-pay

## 2018-10-18 ENCOUNTER — Ambulatory Visit (INDEPENDENT_AMBULATORY_CARE_PROVIDER_SITE_OTHER): Payer: Medicare HMO | Admitting: Psychiatry

## 2018-10-18 ENCOUNTER — Encounter: Payer: Self-pay | Admitting: Psychiatry

## 2018-10-18 DIAGNOSIS — F330 Major depressive disorder, recurrent, mild: Secondary | ICD-10-CM | POA: Diagnosis not present

## 2018-10-18 DIAGNOSIS — Z8659 Personal history of other mental and behavioral disorders: Secondary | ICD-10-CM

## 2018-10-18 DIAGNOSIS — F411 Generalized anxiety disorder: Secondary | ICD-10-CM

## 2018-10-18 DIAGNOSIS — R69 Illness, unspecified: Secondary | ICD-10-CM | POA: Diagnosis not present

## 2018-10-18 NOTE — Progress Notes (Signed)
Tc on  10-17-18 patient medical and surgery hx, and allergies were reviewed with no changes.  Pt medication and pharmacy were reviewed and updated. Pt vital not taken due to patient having a phone visit.

## 2018-10-18 NOTE — Progress Notes (Signed)
Virtual Visit via Telephone Note  I connected with [name] on 10/18/18 at  1:40 PM EDT by telephone and verified that I am speaking with the correct person using two identifiers.   I discussed the limitations, risks, security and privacy concerns of performing an evaluation and management service by telephone and the availability of in person appointments. I also discussed with the patient that there may be a patient responsible charge related to this service. The patient expressed understanding and agreed to proceed.   I discussed the assessment and treatment plan with the patient. The patient was provided an opportunity to ask questions and all were answered. The patient agreed with the plan and demonstrated an understanding of the instructions.   The patient was advised to call back or seek an in-person evaluation if the symptoms worsen or if the condition fails to improve as anticipated.  I provided 15 minutes of non-face-to-face time during this encounter.   Ursula Alert, MD  [facility] MD OP Progress Note  10/18/2018 2:55 PM Kristen Pennington  MRN:  [id number]  Chief Complaint:  Chief Complaint    Follow-up     HPI: Kristen Pennington is an 85 year old Caucasian female, widowed, lives in [place], has a history of anxiety, depression, psychosis unspecified type, OSA, vision loss, hearing loss, essential hypertension was evaluated by phone today.  Patient today reports she is currently trying to stay indoors mostly due to the coronavirus outbreak.  She reports she has not seen her children in a while and that is kind of difficult for her.  She however has been coping okay.  Patient reports she ran out of her Klonopin and has not taken it for a week now.  She does report some mild anxiety symptoms otherwise denies any significant withdrawal symptoms.  Discussed with patient that now that she has been off for a week it will be easier for her to taper it off.  Discussed with her to take the  Klonopin only 2-3 times a week as needed for severe anxiety symptoms.  Discussed with her to work on gradually tapering it off.  Patient reports she otherwise is compliant on her medications.  Patient is sleeping well.  Patient denies any significant perceptual disturbances and did not seem to be preoccupied with any delusions.  She does mention that she was having some trouble with the Internet and currently does not have Internet connection.  Her children are helping her to buy her groceries, they order it online and she does have to go and pick it up.  Patient denies any other concerns today. Visit Diagnosis:    ICD-10-CM   1. Generalized anxiety disorder F41.1   2. MDD (major depressive disorder), recurrent episode, mild ([place]) F33.0   3. History of psychosis Z86.59     Past Psychiatric History: I have reviewed past psychiatric history from my progress note on 07/25/2018.  Past trials of Klonopin, Lexapro, Abilify, mirtazapine.  Past Medical History:  Past Medical History:  Diagnosis Date  . Arthritis   . Atrophic vaginitis 03/06/2013   Last Assessment & Plan:  She will plan to continue estradiol vaginal cream.  Prescription was rewritten stipulating use of the generic product.   . Atypical migraine 03/06/2013   Last Assessment & Plan:  Since she rarely takes sumatriptan, we will discontinue it. Continue sparing use of OTC migraine products.   . Avitaminosis D 03/06/2013   Last Assessment & Plan:  Recheck vitamin D level. Plan to continue vitamin D  supplementation.   . Ceratitis 08/01/2013   Last Assessment & Plan:  Because of the expense, she will discontinue Restasis. She will use over-the-counter moisturizing eyedrops and liquid gel.   . Colon polyp   . 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   . 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   . Cystocele, midline 03/06/2013  . Demoralization and apathy 03/06/2013   Last Assessment & Plan:  Since she has been taking bupropion only once a day, I will write the prescription with the correct instructions and correct quantity. She has done well on this for years and she is encouraged to take it regularly   . Depression   . Environmental allergies   . Epistaxis   . Essential (primary) hypertension 05/17/2013   Last Assessment & Plan:  Her blood pressure is well-controlled. We will plan to continue hydrochlorothiazide and benazepril.  Both are generic and should be affordable on her health plan.   Marland Kitchen GERD (gastroesophageal reflux disease)   . Hemorrhoid   . Inflammation of sacroiliac joint ([place]) 03/06/2013   Last Assessment & Plan:  She is doing well on her present regimen of fentanyl patch supplemented with sparing use of tramadol/acetaminophen. We will continue that regimen.   . Sinusitis   . Sleep apnea    CPAP    Past Surgical History:  Procedure Laterality Date  . COLONOSCOPY  05-03-15  . COLONOSCOPY WITH PROPOFOL N/A 05/03/2015   Procedure: COLONOSCOPY WITH PROPOFOL;  Surgeon: Lucilla Lame, MD;  Location: [facility];  Service: Endoscopy;  Laterality: N/A;  CPAP  . DILATION AND CURETTAGE OF UTERUS  1970  . ENDOMETRIAL BIOPSY    . EYE SURGERY Right 2017  . POLYPECTOMY  05/03/2015   Procedure: POLYPECTOMY;  Surgeon: Lucilla Lame, MD;  Location: [facility];  Service: Endoscopy;;  . ROTATOR CUFF REPAIR  2007  . SPINE SURGERY      Family Psychiatric History: I have reviewed family psychiatric history from my progress note on 07/25/2018.  Family History:  Family History  Problem Relation Age of Onset  . Arthritis Mother   . Alzheimer's disease Father   . Heart disease Sister   . Cancer Brother   . Ovarian cancer Neg Hx   . Breast cancer Neg Hx   . Colon cancer Neg Hx   . Diabetes Neg Hx     Social History:  Reviewed social history from my progress note on 07/25/2018. Social History   Socioeconomic History  . Marital status: Widowed    Spouse name: Not on file  . Number of children: 2  . Years of education: Not on file  . Highest education level: Some college, no degree  Occupational History  . Not on file  Social Needs  . Financial resource strain: Hard  . Food insecurity:    Worry: Often true    Inability: Often true  . Transportation needs:    Medical: No    Non-medical: No  Tobacco Use  . Smoking status: Never Smoker  . Smokeless tobacco: Never Used  Substance and Sexual Activity  . Alcohol use: No    Alcohol/week: 0.0 standard drinks  . Drug use: No  . Sexual activity: Not Currently  Lifestyle  . Physical activity:    Days per week: 0 days  Minutes per session: 0 min  . Stress: To some extent  Relationships  . Social connections:    Talks on phone: Not on file    Gets together: Not on file    Attends religious service: More than 4 times per year    Active member of club or organization: No    Attends meetings of clubs or organizations: Never    Relationship status: Widowed  Other Topics Concern  . Not on file  Social History Narrative  . Not on file    Allergies:  Allergies  Allergen Reactions  . Linaclotide Swelling    Metabolic Disorder Labs: Lab Results  Component Value Date   HGBA1C 5.4 08/01/2018   Lab Results  Component Value Date   PROLACTIN 8.1 08/01/2018   Lab Results  Component Value Date   CHOL 245 (H) 08/01/2018   TRIG 187 (H) 08/01/2018   HDL 49 08/01/2018   LDLCALC 159 (H) 08/01/2018   Lab Results  Component Value Date   TSH 2.830 08/10/2018   TSH 5.550 (H) 08/01/2018    Therapeutic Level Labs: No results found for: LITHIUM No results found for: VALPROATE No components found for:  CBMZ  Current Medications: Current Outpatient Medications  Medication Sig Dispense Refill  . acetaminophen (TYLENOL) 500 MG tablet Take 500  mg by mouth every 6 (six) hours as needed.    . ARIPiprazole (ABILIFY) 2 MG tablet Take 0.5 tablets (1 mg total) by mouth daily. 45 tablet 0  . benazepril-hydrochlorthiazide (LOTENSIN HCT) 20-25 MG tablet Take by mouth.    . bisoprolol-hydrochlorothiazide (ZIAC) 2.5-6.25 MG tablet Take one tab po qd for bp 30 tablet 12  . Calcium Carb-Cholecalciferol 600-800 MG-UNIT TABS Take by mouth.    . Carboxymethylcellulose Sodium (THERATEARS OP) Apply to eye.    . Cholecalciferol (VITAMIN D3) 5000 units TABS Take 5,000 Units by mouth daily.    . clonazePAM (KLONOPIN) 0.5 MG tablet Take 0.5 tablets (0.25 mg total) by mouth 2 (two) times daily as needed for anxiety. 30 tablet 0  . dicyclomine (BENTYL) 10 MG capsule Take 10 mg by mouth 4 (four) times daily -  before meals and at bedtime.    . docusate sodium (COLACE) 50 MG capsule Take 50 mg by mouth daily as needed for mild constipation. Mon, Wed, Fri    . escitalopram (LEXAPRO) 20 MG tablet Take 1 tablet (20 mg total) by mouth daily. 90 tablet 0  . estradiol (ESTRACE) 0.1 MG/GM vaginal cream Place vaginally as needed.     . fluticasone (FLONASE) 50 MCG/ACT nasal spray 1 SPRAY IN EACH NOSTRIL ONCE ADAY AS NEEDED 16 g 1  . ibuprofen (ADVIL,MOTRIN) 200 MG tablet Take 200 mg by mouth every 6 (six) hours as needed.    Marland Kitchen Lifitegrast (XIIDRA) 5 % SOLN Apply to eye.    . Liniments (SALONPAS PAIN RELIEF PATCH EX) Apply 1 patch topically 2 (two) times daily.    Marland Kitchen lubiprostone (AMITIZA) 8 MCG capsule TAKE 1 CAPSULE BY MOUTH TWICE DAILY WITHA MEAL    . magnesium hydroxide (MILK OF MAGNESIA) 400 MG/5ML suspension Take by mouth.    . mirtazapine (REMERON) 15 MG tablet Take 1 tablet (15 mg total) by mouth at bedtime. 90 tablet 0  . Multiple Vitamins-Minerals (CENTRUM SILVER 50+WOMEN) TABS Take by mouth.    Marland Kitchen omeprazole (PRILOSEC) 40 MG capsule Take by mouth.    . oxybutynin (DITROPAN-XL) 10 MG 24 hr tablet TAKE 1 TABLET BY MOUTH DAILY 30 tablet 3  .  pantoprazole  (PROTONIX) 40 MG tablet Take 1 tablet (40 mg total) by mouth daily. 90 tablet 1  . promethazine (PHENERGAN) 25 MG tablet Take by mouth.    . ranitidine (ZANTAC) 150 MG tablet Take by mouth.    Marland Kitchen rOPINIRole (REQUIP) 1 MG tablet TAKE ONE TABLET 3 TIMES DAILY AS NEEDED FOR RESTLESS LEGS 90 tablet 3  . shark liver oil-cocoa butter (PREPARATION H) 0.25-3-85.5 % suppository Place 1 suppository rectally as needed for hemorrhoids.    . shark liver oil-cocoa butter (PREPARATION H) 0.25-88.44 % suppository Place rectally.    . trolamine salicylate (ASPERCREME) 10 % cream Apply topically.     No current facility-administered medications for this visit.      Musculoskeletal: Strength & Muscle Tone: Unable to assess Gait & Station: Unable to assess Patient leans: N/A  Psychiatric Specialty Exam: Review of Systems  Psychiatric/Behavioral: The patient is nervous/anxious.   All other systems reviewed and are negative.   There were no vitals taken for this visit.There is no height or weight on file to calculate BMI.  General Appearance: Unable to assess  Eye Contact:  Unable to assess  Speech:  Clear and Coherent  Volume:  Normal  Mood:  Anxious  Affect:  Unable to assess  Thought Process:  Goal Directed and Descriptions of Associations: Intact  Orientation:  Full (Time, Place, and Person)  Thought Content: Logical   Suicidal Thoughts:  No  Homicidal Thoughts:  No  Memory:  Immediate;   Fair Recent;   Fair Remote;   Fair  Judgement:  Fair  Insight:  Fair  Psychomotor Activity:  Unable to assess  Concentration:  Concentration: Fair and Attention Span: Fair  Recall:  [employer] of Knowledge: Fair  Language: Fair  Akathisia:  No  Handed:  Right  AIMS (if indicated): Unable to assess  Assets:  Communication Skills Desire for Improvement  ADL's:  Intact  Cognition: WNL  Sleep:  Fair   Screenings: Mini-Mental     Clinical Support from 03/02/2018 in [facility], [facility]  Total  Score (max 30 points )  27    PHQ2-9     Office Visit from 08/08/2018 in [facility], [facility] Office Visit from 06/30/2018 in [facility], [facility] Office Visit from 05/02/2018 in [facility] [place] from 03/02/2018 in [facility], [facility] Office Visit from 01/05/2018 in [facility], [facility]  PHQ-2 Total Score  1  0  0  0  0       Assessment and Plan: Meckenzie is an 84 year old female, widowed, has a history of anxiety disorder, depression, history of psychosis unspecified, essential hypertension, chronic pain, OSA, vision loss, hearing loss was evaluated by phone today.  Patient has psychosocial stressors of the current coronavirus outbreak, being widowed.  Patient however is living in a senior living community, has good social support system.  She will continue medications as noted below.  Plan Generalized anxiety disorder-improving Mirtazapine 15 mg at bedtime. We will continue Lexapro 20 mg p.o. daily, reduced dosage for now. Patient to continue clonazepam, was initiated by primary medical doctor.  Patient was prescribed to take it 3 times a day as needed.  However she ran out a week ago and currently denies any significant withdrawal symptoms.  Patient was advised to take it 2-3 times a week as needed and to try to work on gradually tapering it off.  For MDD-improving Mirtazapine as prescribed Abilify 1 mg p.o. daily  For psychosis-unspecified-resolved Patient did not seem to be preoccupied with any delusions. We will continue Abilify at low dose at this time.  Follow-up in clinic in 1 month or sooner if needed.  I have spent atleast 15 minutes non face to face with patient today. More than 50 % of the time was spent for psychoeducation and supportive psychotherapy and care coordination.  This note was generated in part or whole with voice recognition software. Voice recognition is usually quite accurate but there are  transcription errors that can and very often do occur. I apologize for any typographical errors that were not detected and corrected.        Ursula Alert, MD 10/18/2018, 2:55 PM

## 2018-10-31 ENCOUNTER — Other Ambulatory Visit: Payer: Self-pay | Admitting: Psychiatry

## 2018-10-31 DIAGNOSIS — F411 Generalized anxiety disorder: Secondary | ICD-10-CM

## 2018-10-31 MED ORDER — CLONAZEPAM 0.5 MG PO TABS: 0.2500 mg | ORAL_TABLET | ORAL | 0 refills | Status: DC

## 2018-10-31 NOTE — Telephone Encounter (Signed)
Sent klonopin to pharmacy

## 2018-11-08 ENCOUNTER — Encounter: Payer: Self-pay | Admitting: Nurse Practitioner

## 2018-11-08 ENCOUNTER — Ambulatory Visit (INDEPENDENT_AMBULATORY_CARE_PROVIDER_SITE_OTHER): Payer: Medicare HMO | Admitting: Nurse Practitioner

## 2018-11-08 ENCOUNTER — Other Ambulatory Visit: Payer: Self-pay

## 2018-11-08 VITALS — BP 132/81 | HR 72 | Ht 65.0 in | Wt 145.0 lb

## 2018-11-08 DIAGNOSIS — K581 Irritable bowel syndrome with constipation: Secondary | ICD-10-CM | POA: Diagnosis not present

## 2018-11-08 DIAGNOSIS — I10 Essential (primary) hypertension: Secondary | ICD-10-CM

## 2018-11-08 DIAGNOSIS — E785 Hyperlipidemia, unspecified: Secondary | ICD-10-CM

## 2018-11-08 DIAGNOSIS — R69 Illness, unspecified: Secondary | ICD-10-CM | POA: Diagnosis not present

## 2018-11-08 DIAGNOSIS — F411 Generalized anxiety disorder: Secondary | ICD-10-CM

## 2018-11-08 DIAGNOSIS — K219 Gastro-esophageal reflux disease without esophagitis: Secondary | ICD-10-CM | POA: Diagnosis not present

## 2018-11-08 MED ORDER — PANTOPRAZOLE SODIUM 40 MG PO TBEC: 40.0000 mg | DELAYED_RELEASE_TABLET | Freq: Every day | ORAL | 1 refills | Status: DC

## 2018-11-08 MED ORDER — LUBIPROSTONE 8 MCG PO CAPS: ORAL_CAPSULE | ORAL | 3 refills | Status: DC

## 2018-11-08 NOTE — Progress Notes (Signed)
[facility] [place], [place] [address]  Internal MEDICINE  Telephone Visit  Patient Name: CORRINA STEFFENSEN  [id number]  [id number]  Date of Service: 11/27/2018  I connected with the patient at 3:30pm by telephone and verified the patients identity using two identifiers.   I discussed the limitations, risks, security and privacy concerns of performing an evaluation and management service by telephone and the availability of in person appointments. I also discussed with the patient that there may be a patient responsible charge related to the service.  The patient expressed understanding and agrees to proceed.    Chief Complaint  Patient presents with  . Telephone Screen    PHONE Visit [phone]  . Telephone Assessment  . Medical Management of Chronic Issues    3 month follow up, pt have some concerns about medication  . Hypertension  . Hyperlipidemia    The patient has been contacted via telephone for follow up visit due to concerns for spread of novel coronavirus. Blood pressure very elevated today. She is taking blood pressure medication as prescribed .she read back blood pressure readings for the past few days. Readings have been 132/81, 109/64, 120/69, and 112/71.  The patient is having a lot of trouble with her eyes. She has gotten to the point where she is unable to read. She is seeing eye doctor for this. Had surgery on one side. Feels like she has film over her eyes, where she can still see ok long distance, and is still able to drive.  States that she is having some trouble with feeling down a lot more than usual, especially since being restricted to home to due stay-at-home orders, feels isolated, but overall, feels like she is handling this ok. She still sees psychiatry. Gradually weaning off her clonazepam.  Wants to come off of her pantoprazole. Doesn't have too many problems with GERD, but seldom. She also wants to come off of amitiza. Takes this  twice daily. Has had a  Few episodes of diarrhea, which some up suddenly, resulting in episodes of incontinence. On other days, she states that on other days, her bowels have been pretty regular recently. Has increased fiber and roughage in her diet.        Current Medication: Outpatient Encounter Medications as of 11/08/2018  Medication Sig Note  . acetaminophen (TYLENOL) 500 MG tablet Take 500 mg by mouth every 6 (six) hours as needed.   . ARIPiprazole (ABILIFY) 2 MG tablet Take 0.5 tablets (1 mg total) by mouth daily.   . benazepril-hydrochlorthiazide (LOTENSIN HCT) 20-25 MG tablet Take by mouth.   . bisoprolol-hydrochlorothiazide (ZIAC) 2.5-6.25 MG tablet Take one tab po qd for bp   . Calcium Carb-Cholecalciferol 600-800 MG-UNIT TABS Take by mouth.   . Carboxymethylcellulose Sodium (THERATEARS OP) Apply to eye.   . Cholecalciferol (VITAMIN D3) 5000 units TABS Take 5,000 Units by mouth daily.   . clonazePAM (KLONOPIN) 0.5 MG tablet Take 0.5 tablets (0.25 mg total) by mouth as directed. Take 2-3 times a week only as needed for severe anxiety attacks   . dicyclomine (BENTYL) 10 MG capsule Take 10 mg by mouth 4 (four) times daily -  before meals and at bedtime.   . docusate sodium (COLACE) 50 MG capsule Take 50 mg by mouth daily as needed for mild constipation. Mon, Wed, Fri   . escitalopram (LEXAPRO) 20 MG tablet Take 1 tablet (20 mg total) by mouth daily.   Marland Kitchen estradiol (ESTRACE) 0.1 MG/GM vaginal  cream Place vaginally as needed.  03/26/2015: Received from: [facility]  . fluticasone (FLONASE) 50 MCG/ACT nasal spray 1 SPRAY IN EACH NOSTRIL ONCE ADAY AS NEEDED   . ibuprofen (ADVIL,MOTRIN) 200 MG tablet Take 200 mg by mouth every 6 (six) hours as needed.   Marland Kitchen Lifitegrast (XIIDRA) 5 % SOLN Apply to eye.   . Liniments (SALONPAS PAIN RELIEF PATCH EX) Apply 1 patch topically 2 (two) times daily.   Marland Kitchen lubiprostone (AMITIZA) 8 MCG capsule Take 1 capsule po bid prn ibs constipation.   . magnesium  hydroxide (MILK OF MAGNESIA) 400 MG/5ML suspension Take by mouth.   . mirtazapine (REMERON) 15 MG tablet Take 1 tablet (15 mg total) by mouth at bedtime.   . Multiple Vitamins-Minerals (CENTRUM SILVER 50+WOMEN) TABS Take by mouth.   Marland Kitchen omeprazole (PRILOSEC) 40 MG capsule Take by mouth.   . oxybutynin (DITROPAN-XL) 10 MG 24 hr tablet TAKE 1 TABLET BY MOUTH DAILY   . pantoprazole (PROTONIX) 40 MG tablet Take 1 tablet (40 mg total) by mouth daily.   . promethazine (PHENERGAN) 25 MG tablet Take by mouth.   . ranitidine (ZANTAC) 150 MG tablet Take by mouth.   Marland Kitchen rOPINIRole (REQUIP) 1 MG tablet TAKE ONE TABLET 3 TIMES DAILY AS NEEDED FOR RESTLESS LEGS   . shark liver oil-cocoa butter (PREPARATION H) 0.25-3-85.5 % suppository Place 1 suppository rectally as needed for hemorrhoids. 05/03/2015: PRN  . shark liver oil-cocoa butter (PREPARATION H) 0.25-88.44 % suppository Place rectally.   . trolamine salicylate (ASPERCREME) 10 % cream Apply topically. 04/08/2016: Received from: [facility]: Apply topically as needed.  . [DISCONTINUED] lubiprostone (AMITIZA) 8 MCG capsule TAKE 1 CAPSULE BY MOUTH TWICE DAILY WITHA MEAL 04/08/2016: Received from: [facility]  . [DISCONTINUED] pantoprazole (PROTONIX) 40 MG tablet Take 1 tablet (40 mg total) by mouth daily.    No facility-administered encounter medications on file as of 11/08/2018.     Surgical History: Past Surgical History:  Procedure Laterality Date  . COLONOSCOPY  05-03-15  . COLONOSCOPY WITH PROPOFOL N/A 05/03/2015   Procedure: COLONOSCOPY WITH PROPOFOL;  Surgeon: Lucilla Lame, MD;  Location: [facility];  Service: Endoscopy;  Laterality: N/A;  CPAP  . DILATION AND CURETTAGE OF UTERUS  1970  . ENDOMETRIAL BIOPSY    . EYE SURGERY Right 2017  . POLYPECTOMY  05/03/2015   Procedure: POLYPECTOMY;  Surgeon: Lucilla Lame, MD;  Location: [facility];  Service: Endoscopy;;  . ROTATOR CUFF REPAIR   2007  . SPINE SURGERY      Medical History: Past Medical History:  Diagnosis Date  . Arthritis   . Atrophic vaginitis 03/06/2013   Last Assessment & Plan:  She will plan to continue estradiol vaginal cream.  Prescription was rewritten stipulating use of the generic product.   . Atypical migraine 03/06/2013   Last Assessment & Plan:  Since she rarely takes sumatriptan, we will discontinue it. Continue sparing use of OTC migraine products.   . Avitaminosis D 03/06/2013   Last Assessment & Plan:  Recheck vitamin D level. Plan to continue vitamin D supplementation.   . Ceratitis 08/01/2013   Last Assessment & Plan:  Because of the expense, she will discontinue Restasis. She will use over-the-counter moisturizing eyedrops and liquid gel.   . Colon polyp   . 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   . 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   . Cystocele, midline 03/06/2013  . Demoralization and apathy 03/06/2013   Last Assessment & Plan:  Since she has been taking bupropion only once a day, I will write the prescription with the correct instructions and correct quantity. She has done well on this for years and she is encouraged to take it regularly   . Depression   . Environmental allergies   . Epistaxis   . Essential (primary) hypertension 05/17/2013   Last Assessment & Plan:  Her blood pressure is well-controlled. We will plan to continue hydrochlorothiazide and benazepril.  Both are generic and should be affordable on her health plan.   Marland Kitchen GERD (gastroesophageal reflux disease)   . Hemorrhoid   . Inflammation of sacroiliac joint ([place]) 03/06/2013   Last Assessment & Plan:  She is doing well on her present regimen of fentanyl patch supplemented with sparing use of tramadol/acetaminophen. We will continue that  regimen.   . Sinusitis   . Sleep apnea    CPAP    Family History: Family History  Problem Relation Age of Onset  . Arthritis Mother   . Alzheimer's disease Father   . Heart disease Sister   . Cancer Brother   . Ovarian cancer Neg Hx   . Breast cancer Neg Hx   . Colon cancer Neg Hx   . Diabetes Neg Hx     Social History   Socioeconomic History  . Marital status: Widowed    Spouse name: Not on file  . Number of children: 2  . Years of education: Not on file  . Highest education level: Some college, no degree  Occupational History  . Not on file  Social Needs  . Financial resource strain: Hard  . Food insecurity:    Worry: Often true    Inability: Often true  . Transportation needs:    Medical: No    Non-medical: No  Tobacco Use  . Smoking status: Never Smoker  . Smokeless tobacco: Never Used  Substance and Sexual Activity  . Alcohol use: No    Alcohol/week: 0.0 standard drinks  . Drug use: No  . Sexual activity: Not Currently  Lifestyle  . Physical activity:    Days per week: 0 days    Minutes per session: 0 min  . Stress: To some extent  Relationships  . Social connections:    Talks on phone: Not on file    Gets together: Not on file    Attends religious service: More than 4 times per year    Active member of club or organization: No    Attends meetings of clubs or organizations: Never    Relationship status: Widowed  . Intimate partner violence:    Fear of current or ex partner: No    Emotionally abused: No    Physically abused: No    Forced sexual activity: No  Other Topics Concern  . Not on file  Social History Narrative  . Not on file      Review of Systems  Constitutional: Negative for activity change, chills, fatigue and unexpected weight change.  HENT: Negative for congestion, rhinorrhea, sneezing and sore throat.   Respiratory: Negative for cough, chest tightness, shortness of breath and wheezing.   Cardiovascular: Negative for chest  pain and palpitations.  Gastrointestinal: Negative for abdominal pain, constipation, diarrhea, nausea and vomiting.  Endocrine: Negative for cold intolerance, heat  intolerance, polydipsia and polyuria.       Elevated TSH with most recent lab draw.   Musculoskeletal: Positive for arthralgias, back pain, gait problem and myalgias. Negative for joint swelling and neck pain.  Skin: Negative for rash.  Allergic/Immunologic: Positive for environmental allergies.  Neurological: Positive for headaches. Negative for dizziness, tremors, weakness and numbness.  Hematological: Negative for adenopathy. Does not bruise/bleed easily.  Psychiatric/Behavioral: Positive for dysphoric mood. Negative for behavioral problems and sleep disturbance. The patient is nervous/anxious.        Patient seeing psychiatrist regularly.    Today's Vitals   11/08/18 1519 11/08/18 1546  BP: (!) 182/81 132/81  Pulse: 72   Weight: 145 lb (65.8 kg)   Height: 5\' 5"  (1.651 m)    Body mass index is 24.13 kg/m.  Observation/Objective:  The patient is alert and oriented.and answering questions appropriately. She is in good spirits. Breathing is non labored. No cough appreciated today. She is in no acute distress.    Assessment/Plan: 1. Essential (primary) hypertension Blood pressure stable. Continue blood pressure medication as prescribed.   2. Hyperlipidemia, unspecified hyperlipidemia type Stable.   3. Gastroesophageal reflux disease without esophagitis contineu pantoprazole as prescribed. Refills provided today  - pantoprazole (PROTONIX) 40 MG tablet; Take 1 tablet (40 mg total) by mouth daily.  Dispense: 90 tablet; Refill: 1  4. Irritable bowel syndrome with constipation amitiza 61mcg twice daily as needed for constipation - lubiprostone (AMITIZA) 8 MCG capsule; Take 1 capsule po bid prn ibs constipation.  Dispense: 60 capsule; Refill: 3  5. Generalized anxiety disorder contineu regular visits with psychiatry as  scheduled   General Counseling: hildegarde dunaway understanding of the findings of today's phone visit and agrees with plan of treatment. I have discussed any further diagnostic evaluation that may be needed or ordered today. We also reviewed her medications today. she has been encouraged to call the office with any questions or concerns that should arise related to todays visit.  Hypertension Counseling:   The following hypertensive lifestyle modification were recommended and discussed:  1. Limiting alcohol intake to less than 1 oz/day of ethanol:(24 oz of beer or 8 oz of wine or 2 oz of 100-proof whiskey). 2. Take baby ASA 81 mg daily. 3. Importance of regular aerobic exercise and losing weight. 4. Reduce dietary saturated fat and cholesterol intake for overall cardiovascular health. 5. Maintaining adequate dietary potassium, calcium, and magnesium intake. 6. Regular monitoring of the blood pressure. 7. Reduce sodium intake to less than 100 mmol/day (less than 2.3 gm of sodium or less than 6 gm of sodium choride)   This patient was seen by Dante with Dr Lavera Guise as a part of collaborative care agreement   Meds ordered this encounter  Medications  . lubiprostone (AMITIZA) 8 MCG capsule    Sig: Take 1 capsule po bid prn ibs constipation.    Dispense:  60 capsule    Refill:  3    Order Specific Question:   Supervising Provider    Answer:   Lavera Guise [[id number]]  . pantoprazole (PROTONIX) 40 MG tablet    Sig: Take 1 tablet (40 mg total) by mouth daily.    Dispense:  90 tablet    Refill:  1    Takes prn    Order Specific Question:   Supervising Provider    Answer:   Lavera Guise [[id number]]    Time spent: 59 Minutes    Dr Timoteo Gaul  [facility] Internal medicine

## 2018-11-09 DIAGNOSIS — R140 Abdominal distension (gaseous): Secondary | ICD-10-CM | POA: Diagnosis not present

## 2018-11-09 DIAGNOSIS — K219 Gastro-esophageal reflux disease without esophagitis: Secondary | ICD-10-CM | POA: Diagnosis not present

## 2018-11-09 DIAGNOSIS — K581 Irritable bowel syndrome with constipation: Secondary | ICD-10-CM | POA: Diagnosis not present

## 2018-11-09 DIAGNOSIS — R110 Nausea: Secondary | ICD-10-CM | POA: Diagnosis not present

## 2018-11-23 ENCOUNTER — Ambulatory Visit: Payer: Medicare HMO | Admitting: Psychiatry

## 2018-11-23 DIAGNOSIS — H16223 Keratoconjunctivitis sicca, not specified as Sjogren's, bilateral: Secondary | ICD-10-CM | POA: Diagnosis not present

## 2018-11-27 DIAGNOSIS — K581 Irritable bowel syndrome with constipation: Secondary | ICD-10-CM | POA: Insufficient documentation

## 2018-11-27 DIAGNOSIS — K219 Gastro-esophageal reflux disease without esophagitis: Secondary | ICD-10-CM | POA: Insufficient documentation

## 2018-11-29 ENCOUNTER — Other Ambulatory Visit: Payer: Self-pay | Admitting: Psychiatry

## 2018-11-29 DIAGNOSIS — F411 Generalized anxiety disorder: Secondary | ICD-10-CM

## 2018-11-29 NOTE — Telephone Encounter (Signed)
clonazePAM (KLONOPIN) 0.5 MG tablet  Medication  Date: 10/31/2018 Department: [facility] Ordering/Authorizing: Ursula Alert, MD  Order Providers   Prescribing Provider Encounter Provider  Ursula Alert, MD Ursula Alert, MD  Outpatient Medication Detail    Disp Refills Start End   clonazePAM (KLONOPIN) 0.5 MG tablet 15 tablet 0 11/16/2018    Sig - Route: Take 0.5 tablets (0.25 mg total) by mouth as directed. Take 2-3 times a week only as needed for severe anxiety attacks - Oral   Sent to pharmacy as: clonazePAM (KLONOPIN) 0.5 MG tablet   Notes to Pharmacy: She has been taking 2-3 times a week as needed( to be filled on or after 11/16/2018)   E-Prescribing Status: Receipt confirmed by pharmacy (10/31/2018 3:18 PM EDT)

## 2018-12-01 ENCOUNTER — Other Ambulatory Visit: Payer: Self-pay

## 2018-12-01 ENCOUNTER — Ambulatory Visit (INDEPENDENT_AMBULATORY_CARE_PROVIDER_SITE_OTHER): Payer: Medicare HMO | Admitting: Psychiatry

## 2018-12-01 ENCOUNTER — Encounter: Payer: Self-pay | Admitting: Psychiatry

## 2018-12-01 DIAGNOSIS — F411 Generalized anxiety disorder: Secondary | ICD-10-CM

## 2018-12-01 DIAGNOSIS — Z8659 Personal history of other mental and behavioral disorders: Secondary | ICD-10-CM | POA: Diagnosis not present

## 2018-12-01 DIAGNOSIS — R69 Illness, unspecified: Secondary | ICD-10-CM | POA: Diagnosis not present

## 2018-12-01 DIAGNOSIS — F330 Major depressive disorder, recurrent, mild: Secondary | ICD-10-CM

## 2018-12-01 DIAGNOSIS — F5105 Insomnia due to other mental disorder: Secondary | ICD-10-CM | POA: Diagnosis not present

## 2018-12-01 MED ORDER — ESCITALOPRAM OXALATE 20 MG PO TABS: 20.0000 mg | ORAL_TABLET | Freq: Every day | ORAL | 0 refills | Status: DC

## 2018-12-01 MED ORDER — MIRTAZAPINE 15 MG PO TABS: 7.5000 mg | ORAL_TABLET | Freq: Every day | ORAL | 0 refills | Status: DC

## 2018-12-01 MED ORDER — ARIPIPRAZOLE 2 MG PO TABS: 1.0000 mg | ORAL_TABLET | Freq: Every day | ORAL | 0 refills | Status: DC

## 2018-12-01 NOTE — Progress Notes (Signed)
Virtual Visit via Telephone Note  I connected with Kristen Pennington on 12/01/18 at  1:30 PM EDT by telephone and verified that I am speaking with the correct person using two identifiers.   I discussed the limitations, risks, security and privacy concerns of performing an evaluation and management service by telephone and the availability of in person appointments. I also discussed with the patient that there may be a patient responsible charge related to this service. The patient expressed understanding and agreed to proceed.     I discussed the assessment and treatment plan with the patient. The patient was provided an opportunity to ask questions and all were answered. The patient agreed with the plan and demonstrated an understanding of the instructions.   The patient was advised to call back or seek an in-person evaluation if the symptoms worsen or if the condition fails to improve as anticipated.   [facility] MD OP Progress Note  12/01/2018 3:14 PM Kristen Pennington  MRN:  [id number]  Chief Complaint:  Chief Complaint    Follow-up     HPI: Kristen Pennington is an 85 year old Caucasian female, widowed, lives in [place], has a history of generalized anxiety disorder, MDD, history of psychosis unspecified type, OSA, vision loss, hearing loss, essential hypertension was evaluated by phone today.  Patient today reports she is mildly anxious about the COVID-19 outbreak.  She however has been staying indoor mostly.  She reports she has been trying to help out other residents at the facility.  So far she has been coping okay.  She reports she has been limiting the use of Klonopin.  Patient reports she is aware about the risk and will try to cut back.  She reports sleep continues to be restless on and off.  She does have difficulty falling asleep.  Discussed reducing the mirtazapine to see if that will help.  She continues to be compliant on Lexapro and Abilify.  She does not appear to be delusional or  psychotic today.  She denied any auditory or visual hallucinations.  She denies suicidality.  Patient reports she does not have her daughter or son with her right now however will try to have them for the next visit.  Pt appeared to be alert, oriented to person place time and situation.  Patient denied any other concerns today. Visit Diagnosis:    ICD-10-CM   1. Generalized anxiety disorder F41.1 mirtazapine (REMERON) 15 MG tablet    ARIPiprazole (ABILIFY) 2 MG tablet    escitalopram (LEXAPRO) 20 MG tablet  2. MDD (major depressive disorder), recurrent episode, mild (HCC) F33.0 mirtazapine (REMERON) 15 MG tablet    escitalopram (LEXAPRO) 20 MG tablet  3. Insomnia due to mental condition F51.05   4. History of psychosis Z86.59     Past Psychiatric History: I have reviewed past psychiatric history from my progress note on 07/25/2018.  Past trials of Klonopin, Lexapro, Abilify, mirtazapine.  Past Medical History:  Past Medical History:  Diagnosis Date  . Arthritis   . Atrophic vaginitis 03/06/2013   Last Assessment & Plan:  She will plan to continue estradiol vaginal cream.  Prescription was rewritten stipulating use of the generic product.   . Atypical migraine 03/06/2013   Last Assessment & Plan:  Since she rarely takes sumatriptan, we will discontinue it. Continue sparing use of OTC migraine products.   . Avitaminosis D 03/06/2013   Last Assessment & Plan:  Recheck vitamin D level. Plan to continue vitamin D supplementation.   . Ceratitis  08/01/2013   Last Assessment & Plan:  Because of the expense, she will discontinue Restasis. She will use over-the-counter moisturizing eyedrops and liquid gel.   . Colon polyp   . 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   . 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   .  Cystocele, midline 03/06/2013  . Demoralization and apathy 03/06/2013   Last Assessment & Plan:  Since she has been taking bupropion only once a day, I will write the prescription with the correct instructions and correct quantity. She has done well on this for years and she is encouraged to take it regularly   . Depression   . Environmental allergies   . Epistaxis   . Essential (primary) hypertension 05/17/2013   Last Assessment & Plan:  Her blood pressure is well-controlled. We will plan to continue hydrochlorothiazide and benazepril.  Both are generic and should be affordable on her health plan.   Marland Kitchen GERD (gastroesophageal reflux disease)   . Hemorrhoid   . Inflammation of sacroiliac joint ([place]) 03/06/2013   Last Assessment & Plan:  She is doing well on her present regimen of fentanyl patch supplemented with sparing use of tramadol/acetaminophen. We will continue that regimen.   . Sinusitis   . Sleep apnea    CPAP    Past Surgical History:  Procedure Laterality Date  . COLONOSCOPY  05-03-15  . COLONOSCOPY WITH PROPOFOL N/A 05/03/2015   Procedure: COLONOSCOPY WITH PROPOFOL;  Surgeon: Lucilla Lame, MD;  Location: [facility];  Service: Endoscopy;  Laterality: N/A;  CPAP  . DILATION AND CURETTAGE OF UTERUS  1970  . ENDOMETRIAL BIOPSY    . EYE SURGERY Right 2017  . POLYPECTOMY  05/03/2015   Procedure: POLYPECTOMY;  Surgeon: Lucilla Lame, MD;  Location: [facility];  Service: Endoscopy;;  . ROTATOR CUFF REPAIR  2007  . SPINE SURGERY      Family Psychiatric History: I have reviewed family psychiatric history from my progress note on 07/25/2018. Family History:  Family History  Problem Relation Age of Onset  . Arthritis Mother   . Alzheimer's disease Father   . Heart disease Sister   . Cancer Brother   . Ovarian cancer Neg Hx   . Breast cancer Neg Hx   . Colon cancer Neg Hx   . Diabetes Neg Hx     Social History: I have reviewed social history from my progress note on  07/25/2018. Social History   Socioeconomic History  . Marital status: Widowed    Spouse name: Not on file  . Number of children: 2  . Years of education: Not on file  . Highest education level: Some college, no degree  Occupational History  . Not on file  Social Needs  . Financial resource strain: Hard  . Food insecurity:    Worry: Often true    Inability: Often true  . Transportation needs:    Medical: No    Non-medical: No  Tobacco Use  . Smoking status: Never Smoker  . Smokeless tobacco: Never Used  Substance and Sexual Activity  . Alcohol use: No    Alcohol/week: 0.0 standard drinks  . Drug use: No  . Sexual activity: Not Currently  Lifestyle  . Physical activity:    Days per week: 0 days    Minutes per session: 0  min  . Stress: To some extent  Relationships  . Social connections:    Talks on phone: Not on file    Gets together: Not on file    Attends religious service: More than 4 times per year    Active member of club or organization: No    Attends meetings of clubs or organizations: Never    Relationship status: Widowed  Other Topics Concern  . Not on file  Social History Narrative  . Not on file    Allergies:  Allergies  Allergen Reactions  . Linaclotide Swelling    Metabolic Disorder Labs: Lab Results  Component Value Date   HGBA1C 5.4 08/01/2018   Lab Results  Component Value Date   PROLACTIN 8.1 08/01/2018   Lab Results  Component Value Date   CHOL 245 (H) 08/01/2018   TRIG 187 (H) 08/01/2018   HDL 49 08/01/2018   LDLCALC 159 (H) 08/01/2018   Lab Results  Component Value Date   TSH 2.830 08/10/2018   TSH 5.550 (H) 08/01/2018    Therapeutic Level Labs: No results found for: LITHIUM No results found for: VALPROATE No components found for:  CBMZ  Current Medications: Current Outpatient Medications  Medication Sig Dispense Refill  . simethicone (MYLICON) 80 MG chewable tablet Chew by mouth.    Marland Kitchen acetaminophen (TYLENOL) 500 MG  tablet Take 500 mg by mouth every 6 (six) hours as needed.    . ARIPiprazole (ABILIFY) 2 MG tablet Take 0.5 tablets (1 mg total) by mouth daily. 45 tablet 0  . benazepril-hydrochlorthiazide (LOTENSIN HCT) 20-25 MG tablet Take by mouth.    . bisoprolol-hydrochlorothiazide (ZIAC) 2.5-6.25 MG tablet Take one tab po qd for bp 30 tablet 12  . Calcium Carb-Cholecalciferol 600-800 MG-UNIT TABS Take by mouth.    . Carboxymethylcellulose Sodium (THERATEARS OP) Apply to eye.    . Cholecalciferol (VITAMIN D3) 5000 units TABS Take 5,000 Units by mouth daily.    . clonazePAM (KLONOPIN) 0.5 MG tablet Take 0.5 tablets (0.25 mg total) by mouth as directed. Take 2-3 times a week only as needed for severe anxiety attacks 15 tablet 0  . dicyclomine (BENTYL) 10 MG capsule Take 10 mg by mouth 4 (four) times daily -  before meals and at bedtime.    . docusate sodium (COLACE) 50 MG capsule Take 50 mg by mouth daily as needed for mild constipation. Mon, Wed, Fri    . escitalopram (LEXAPRO) 20 MG tablet Take 1 tablet (20 mg total) by mouth daily. 90 tablet 0  . estradiol (ESTRACE) 0.1 MG/GM vaginal cream Place vaginally as needed.     . fluticasone (FLONASE) 50 MCG/ACT nasal spray 1 SPRAY IN EACH NOSTRIL ONCE ADAY AS NEEDED 16 g 1  . ibuprofen (ADVIL,MOTRIN) 200 MG tablet Take 200 mg by mouth every 6 (six) hours as needed.    Marland Kitchen Lifitegrast (XIIDRA) 5 % SOLN Apply to eye.    . Liniments (SALONPAS PAIN RELIEF PATCH EX) Apply 1 patch topically 2 (two) times daily.    Marland Kitchen lubiprostone (AMITIZA) 8 MCG capsule Take 1 capsule po bid prn ibs constipation. 60 capsule 3  . magnesium hydroxide (MILK OF MAGNESIA) 400 MG/5ML suspension Take by mouth.    . mirtazapine (REMERON) 15 MG tablet Take 0.5 tablets (7.5 mg total) by mouth at bedtime. For sleep 45 tablet 0  . Multiple Vitamins-Minerals (CENTRUM SILVER 50+WOMEN) TABS Take by mouth.    Marland Kitchen omeprazole (PRILOSEC) 40 MG capsule Take by mouth.    Marland Kitchen  oxybutynin (DITROPAN-XL) 10 MG 24 hr  tablet TAKE 1 TABLET BY MOUTH DAILY 30 tablet 3  . pantoprazole (PROTONIX) 40 MG tablet Take 1 tablet (40 mg total) by mouth daily. 90 tablet 1  . promethazine (PHENERGAN) 25 MG tablet Take by mouth.    . ranitidine (ZANTAC) 150 MG tablet Take by mouth.    . RESTASIS 0.05 % ophthalmic emulsion     . rOPINIRole (REQUIP) 1 MG tablet TAKE ONE TABLET 3 TIMES DAILY AS NEEDED FOR RESTLESS LEGS 90 tablet 3  . shark liver oil-cocoa butter (PREPARATION H) 0.25-3-85.5 % suppository Place 1 suppository rectally as needed for hemorrhoids.    . shark liver oil-cocoa butter (PREPARATION H) 0.25-88.44 % suppository Place rectally.    . trolamine salicylate (ASPERCREME) 10 % cream Apply topically.     No current facility-administered medications for this visit.      Musculoskeletal: Strength & Muscle Tone: UTA Gait & Station: UTA Patient leans: N/A  Psychiatric Specialty Exam: Review of Systems  Psychiatric/Behavioral: The patient is nervous/anxious and has insomnia.   All other systems reviewed and are negative.   There were no vitals taken for this visit.There is no height or weight on file to calculate BMI.  General Appearance: UTA  Eye Contact:  Fair  Speech:  Normal Rate  Volume:  Normal  Mood:  Anxious  Affect:  UTA  Thought Process:  Goal Directed and Descriptions of Associations: Intact  Orientation:  Full (Time, Place, and Person)  Thought Content: Logical   Suicidal Thoughts:  No  Homicidal Thoughts:  No  Memory:  Immediate;   Fair Recent;   Fair Remote;   Fair  Judgement:  Fair  Insight:  Fair  Psychomotor Activity:  UTA  Concentration:  Concentration: Fair and Attention Span: Fair  Recall:  [employer] of Knowledge: Fair  Language: Fair  Akathisia:  No  Handed:  Right  AIMS (if indicated):denies tremors, rigidity,stiffness  Assets:  Communication Skills Desire for Improvement Social Support  ADL's:  Intact  Cognition: WNL  Sleep:  Poor   Screenings: Mini-Mental      Clinical Support from 03/02/2018 in [facility], [facility]  Total Score (max 30 points )  27    [id number]     Office Visit from 11/08/2018 in [facility] Office Visit from 08/08/2018 in [facility], [facility] Office Visit from 06/30/2018 in [facility], [facility] Office Visit from 05/02/2018 in [facility], [place] from 03/02/2018 in [facility], [facility]  PHQ-2 Total Score  0  1  0  0  0       Assessment and Plan: Kristen Pennington is an 85 year old female, widowed, has a history of anxiety disorder, depression, history of psychosis unspecified, essential hypertension, chronic pain, OSA, vision loss, hearing loss was evaluated by phone today.  Patient with psychosocial stressors of the current COVID-19 outbreak, being widowed.  Patient currently lives in a senior living community and has good social support system.  Patient will continue to benefit from medication changes for her sleep problems.  Plan Generalized anxiety disorder-improving Continue Lexapro 20 mg p.o. daily Patient advised to continue to cut back clonazepam.  For MDD-improving Lexapro as prescribed Abilify 1 mg p.o. daily  For psychosis-unspecified-resolved Patient denies any concerns today and does not appear to be preoccupied with any delusions.  For insomnia- restless Reduce mirtazapine to 7.5 mg p.o. nightly.  Follow-up in clinic in 2 to 3 weeks or sooner if  needed.  Appointment scheduled for June 3 at 10:15 AM.  I have spent atleast 15 minutes non face to face with patient today. More than 50 % of the time was spent for psychoeducation and supportive psychotherapy and care coordination.  This note was generated in part or whole with voice recognition software. Voice recognition is usually quite accurate but there are transcription errors that can and very often do occur. I apologize for any typographical errors that were not detected and  corrected.          Ursula Alert, MD 12/01/2018, 3:14 PM

## 2018-12-14 ENCOUNTER — Encounter: Payer: Self-pay | Admitting: Psychiatry

## 2018-12-14 ENCOUNTER — Ambulatory Visit (INDEPENDENT_AMBULATORY_CARE_PROVIDER_SITE_OTHER): Payer: Medicare HMO | Admitting: Psychiatry

## 2018-12-14 ENCOUNTER — Other Ambulatory Visit: Payer: Self-pay

## 2018-12-14 DIAGNOSIS — R69 Illness, unspecified: Secondary | ICD-10-CM | POA: Diagnosis not present

## 2018-12-14 DIAGNOSIS — Z8659 Personal history of other mental and behavioral disorders: Secondary | ICD-10-CM | POA: Diagnosis not present

## 2018-12-14 DIAGNOSIS — F5105 Insomnia due to other mental disorder: Secondary | ICD-10-CM | POA: Diagnosis not present

## 2018-12-14 DIAGNOSIS — F330 Major depressive disorder, recurrent, mild: Secondary | ICD-10-CM

## 2018-12-14 DIAGNOSIS — F411 Generalized anxiety disorder: Secondary | ICD-10-CM | POA: Diagnosis not present

## 2018-12-14 MED ORDER — ARIPIPRAZOLE 2 MG PO TABS: 2.0000 mg | ORAL_TABLET | Freq: Every day | ORAL | 0 refills | Status: DC

## 2018-12-14 NOTE — Progress Notes (Signed)
Virtual Visit via Telephone Note  I connected with Kristen Pennington on 12/14/18 at 10:15 AM EDT by telephone and verified that I am speaking with the correct person using two identifiers.   I discussed the limitations, risks, security and privacy concerns of performing an evaluation and management service by telephone and the availability of in person appointments. I also discussed with the patient that there may be a patient responsible charge related to this service. The patient expressed understanding and agreed to proceed.    I discussed the assessment and treatment plan with the patient. The patient was provided an opportunity to ask questions and all were answered. The patient agreed with the plan and demonstrated an understanding of the instructions.   The patient was advised to call back or seek an in-person evaluation if the symptoms worsen or if the condition fails to improve as anticipated.   [facility] MD OP Progress Note  12/14/2018 5:51 PM Kristen Pennington  MRN:  [id number]  Chief Complaint:  Chief Complaint    Follow-up     HPI: Kristen Pennington is an 85 year old Caucasian female, widowed, lives in [place], has a history of generalized anxiety disorder, MDD, history of psychosis unspecified type, OSA, vision loss, hearing loss, essential hypertension was evaluated by phone today.  Patient today reports she is anxious about the current pandemic.  She also has a leg swelling and some diarrhea.  She reports she is worried about that.  Discussed with patient to reach out to her primary provider Dr. Humphrey Rolls.  Patient reports she is trying to limit the use of Klonopin.  She has been cutting it into half however has been taking it regularly.  Discussed with patient to try to use it 2-3 times a week and not more than that.  Discussed with patient her Abilify can be readjusted to address her anxiety symptoms.  She reports sleep is better on the mirtazapine.  Patient denies any suicidality,  homicidality or perceptual disturbances.  She appeared to be alert and oriented.  Collateral information was obtained from Kristen Pennington at [phone].  Per son he feels his mom has been doing better than before.  He will reach out to her today and make sure she gets help for her physical problems like leg swelling.   Visit Diagnosis:    ICD-10-CM   1. Generalized anxiety disorder F41.1 ARIPiprazole (ABILIFY) 2 MG tablet  2. MDD (major depressive disorder), recurrent episode, mild ([place]) F33.0   3. Insomnia due to mental condition F51.05   4. History of psychosis Z86.59     Past Psychiatric History: I have reviewed past psychiatric history from my progress note on 07/25/2018.  Past trials of Klonopin, Lexapro, Abilify, mirtazapine.  Past Medical History:  Past Medical History:  Diagnosis Date  . Arthritis   . Atrophic vaginitis 03/06/2013   Last Assessment & Plan:  She will plan to continue estradiol vaginal cream.  Prescription was rewritten stipulating use of the generic product.   . Atypical migraine 03/06/2013   Last Assessment & Plan:  Since she rarely takes sumatriptan, we will discontinue it. Continue sparing use of OTC migraine products.   . Avitaminosis D 03/06/2013   Last Assessment & Plan:  Recheck vitamin D level. Plan to continue vitamin D supplementation.   . Ceratitis 08/01/2013   Last Assessment & Plan:  Because of the expense, she will discontinue Restasis. She will use over-the-counter moisturizing eyedrops and liquid gel.   . Colon polyp   . Combined fat  and carbohydrate induced hyperlipemia 03/06/2013   Last Assessment & Plan:  Recheck fasting lipids.   . 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   . Cystocele, midline 03/06/2013  . Demoralization and apathy 03/06/2013   Last Assessment & Plan:  Since she has been taking bupropion only once a day, I will write the  prescription with the correct instructions and correct quantity. She has done well on this for years and she is encouraged to take it regularly   . Depression   . Environmental allergies   . Epistaxis   . Essential (primary) hypertension 05/17/2013   Last Assessment & Plan:  Her blood pressure is well-controlled. We will plan to continue hydrochlorothiazide and benazepril.  Both are generic and should be affordable on her health plan.   Marland Kitchen GERD (gastroesophageal reflux disease)   . Hemorrhoid   . Inflammation of sacroiliac joint ([place]) 03/06/2013   Last Assessment & Plan:  She is doing well on her present regimen of fentanyl patch supplemented with sparing use of tramadol/acetaminophen. We will continue that regimen.   . Sinusitis   . Sleep apnea    CPAP    Past Surgical History:  Procedure Laterality Date  . COLONOSCOPY  05-03-15  . COLONOSCOPY WITH PROPOFOL N/A 05/03/2015   Procedure: COLONOSCOPY WITH PROPOFOL;  Surgeon: Lucilla Lame, MD;  Location: [facility];  Service: Endoscopy;  Laterality: N/A;  CPAP  . DILATION AND CURETTAGE OF UTERUS  1970  . ENDOMETRIAL BIOPSY    . EYE SURGERY Right 2017  . POLYPECTOMY  05/03/2015   Procedure: POLYPECTOMY;  Surgeon: Lucilla Lame, MD;  Location: [facility];  Service: Endoscopy;;  . ROTATOR Pennington REPAIR  2007  . SPINE SURGERY      Family Psychiatric History: I have reviewed family psychiatric history from my progress note on 07/25/2018  Family History:  Family History  Problem Relation Age of Onset  . Arthritis Mother   . Alzheimer's disease Father   . Heart disease Sister   . Cancer Brother   . Ovarian cancer Neg Hx   . Breast cancer Neg Hx   . Colon cancer Neg Hx   . Diabetes Neg Hx     Social History: Reviewed social history from my progress note on 07/25/2018. Social History   Socioeconomic History  . Marital status: Widowed    Spouse name: Not on file  . Number of children: 2  . Years of education: Not on file   . Highest education level: Some college, no degree  Occupational History  . Not on file  Social Needs  . Financial resource strain: Hard  . Food insecurity:    Worry: Often true    Inability: Often true  . Transportation needs:    Medical: No    Non-medical: No  Tobacco Use  . Smoking status: Never Smoker  . Smokeless tobacco: Never Used  Substance and Sexual Activity  . Alcohol use: No    Alcohol/week: 0.0 standard drinks  . Drug use: No  . Sexual activity: Not Currently  Lifestyle  . Physical activity:    Days per week: 0 days    Minutes per session: 0 min  . Stress: To some extent  Relationships  . Social connections:    Talks on phone: Not on file    Gets together: Not on file    Attends religious service:  More than 4 times per year    Active member of club or organization: No    Attends meetings of clubs or organizations: Never    Relationship status: Widowed  Other Topics Concern  . Not on file  Social History Narrative  . Not on file    Allergies:  Allergies  Allergen Reactions  . Linaclotide Swelling    Metabolic Disorder Labs: Lab Results  Component Value Date   HGBA1C 5.4 08/01/2018   Lab Results  Component Value Date   PROLACTIN 8.1 08/01/2018   Lab Results  Component Value Date   CHOL 245 (H) 08/01/2018   TRIG 187 (H) 08/01/2018   HDL 49 08/01/2018   LDLCALC 159 (H) 08/01/2018   Lab Results  Component Value Date   TSH 2.830 08/10/2018   TSH 5.550 (H) 08/01/2018    Therapeutic Level Labs: No results found for: LITHIUM No results found for: VALPROATE No components found for:  CBMZ  Current Medications: Current Outpatient Medications  Medication Sig Dispense Refill  . acetaminophen (TYLENOL) 500 MG tablet Take 500 mg by mouth every 6 (six) hours as needed.    . ARIPiprazole (ABILIFY) 2 MG tablet Take 1 tablet (2 mg total) by mouth daily. 90 tablet 0  . benazepril-hydrochlorthiazide (LOTENSIN HCT) 20-25 MG tablet Take by mouth.     . bisoprolol-hydrochlorothiazide (ZIAC) 2.5-6.25 MG tablet Take one tab po qd for bp 30 tablet 12  . Calcium Carb-Cholecalciferol 600-800 MG-UNIT TABS Take by mouth.    . Carboxymethylcellulose Sodium (THERATEARS OP) Apply to eye.    . Cholecalciferol (VITAMIN D3) 5000 units TABS Take 5,000 Units by mouth daily.    . clonazePAM (KLONOPIN) 0.5 MG tablet Take 0.5 tablets (0.25 mg total) by mouth as directed. Take 2-3 times a week only as needed for severe anxiety attacks 15 tablet 0  . dicyclomine (BENTYL) 10 MG capsule Take 10 mg by mouth 4 (four) times daily -  before meals and at bedtime.    . docusate sodium (COLACE) 50 MG capsule Take 50 mg by mouth daily as needed for mild constipation. Mon, Wed, Fri    . escitalopram (LEXAPRO) 20 MG tablet Take 1 tablet (20 mg total) by mouth daily. 90 tablet 0  . estradiol (ESTRACE) 0.1 MG/GM vaginal cream Place vaginally as needed.     . fluticasone (FLONASE) 50 MCG/ACT nasal spray 1 SPRAY IN EACH NOSTRIL ONCE ADAY AS NEEDED 16 g 1  . ibuprofen (ADVIL,MOTRIN) 200 MG tablet Take 200 mg by mouth every 6 (six) hours as needed.    Marland Kitchen Lifitegrast (XIIDRA) 5 % SOLN Apply to eye.    . Liniments (SALONPAS PAIN RELIEF PATCH EX) Apply 1 patch topically 2 (two) times daily.    Marland Kitchen lubiprostone (AMITIZA) 8 MCG capsule Take 1 capsule po bid prn ibs constipation. 60 capsule 3  . magnesium hydroxide (MILK OF MAGNESIA) 400 MG/5ML suspension Take by mouth.    . mirtazapine (REMERON) 15 MG tablet Take 0.5 tablets (7.5 mg total) by mouth at bedtime. For sleep 45 tablet 0  . Multiple Vitamins-Minerals (CENTRUM SILVER 50+WOMEN) TABS Take by mouth.    Marland Kitchen omeprazole (PRILOSEC) 40 MG capsule Take by mouth.    . oxybutynin (DITROPAN-XL) 10 MG 24 hr tablet TAKE 1 TABLET BY MOUTH DAILY 30 tablet 3  . pantoprazole (PROTONIX) 40 MG tablet Take 1 tablet (40 mg total) by mouth daily. 90 tablet 1  . promethazine (PHENERGAN) 25 MG tablet Take by mouth.    Marland Kitchen  ranitidine (ZANTAC) 150 MG  tablet Take by mouth.    . RESTASIS 0.05 % ophthalmic emulsion     . rOPINIRole (REQUIP) 1 MG tablet TAKE ONE TABLET 3 TIMES DAILY AS NEEDED FOR RESTLESS LEGS 90 tablet 3  . shark liver oil-cocoa butter (PREPARATION H) 0.25-3-85.5 % suppository Place 1 suppository rectally as needed for hemorrhoids.    . shark liver oil-cocoa butter (PREPARATION H) 0.25-88.44 % suppository Place rectally.    . simethicone (MYLICON) 80 MG chewable tablet Chew by mouth.    . trolamine salicylate (ASPERCREME) 10 % cream Apply topically.     No current facility-administered medications for this visit.      Musculoskeletal: Strength & Muscle Tone: reports WNL Gait & Station: UTA Patient leans: N/A  Psychiatric Specialty Exam: Review of Systems  Cardiovascular: Positive for leg swelling.  All other systems reviewed and are negative.   There were no vitals taken for this visit.There is no height or weight on file to calculate BMI.  General Appearance: UTA  Eye Contact:  UTA  Speech:  Normal Rate  Volume:  Normal  Mood:  Anxious  Affect:  UTA  Thought Process:  Goal Directed and Descriptions of Associations: Intact  Orientation:  Full (Time, Place, and Person)  Thought Content: Logical   Suicidal Thoughts:  No  Homicidal Thoughts:  No  Memory:  Immediate;   Fair Recent;   Fair Remote;   Fair  Judgement:  Fair  Insight:  Fair  Psychomotor Activity:  UTA  Concentration:  Concentration: Fair and Attention Span: Fair  Recall:  [employer] of Knowledge: Fair  Language: Fair  Akathisia:  No  Handed:  Right  AIMS (if indicated): Denies tremors, rigidty  Assets:  Communication Skills Desire for Improvement Social Support  ADL's:  Intact  Cognition: WNL  Sleep:  Fair   Screenings: Mini-Mental     Clinical Support [facility] 03/02/2018 in [facility], [facility]  Total Score (max 30 points )  27    PHQ2-9     Office Visit from 11/08/2018 in [facility], [facility] Office Visit from  08/08/2018 in [facility], [facility] Office Visit from 06/30/2018 in [facility], [facility] Office Visit from 05[facility]02/2018 in [facility], [place] from 03/02/2018 in [facility], [facility]  PHQ-2 Total Score  0  1  0  0  0       Assessment and Plan: Rilley is an 85 year old female, widowed, has a history of anxiety, depression, history of psychosis unspecified, essential hypertension, chronic pain, OSA, vision loss, hearing loss was evaluated by phone today.  Patient with psychosocial stressors of current COVID-19 outbreak, her own health problems.  Patient currently lives in a senior living community and has good support system.  Patient continues to struggle with anxiety symptoms and hence will benefit from medication readjustment.  Plan For generalized anxiety disorder- some progress Lexapro 10 mg p.o. daily Increase Abilify to 2 mg p.o. daily She will continue to cut back clonazepam.  For MDD- some improvement Lexapro as prescribed Increase Abilify to 2 mg p.o. daily.  Psychosis unspecified-resolved We will continue to monitor closely.  She does not appear to be preoccupied with any delusions.  For insomnia- improving Mirtazapine 7.5 mg p.o. nightly  I have obtained collateral information from her son Kristen Pennington at [phone].  Discussed with patient as well as son to follow-up with her primary medical doctor for her current leg swelling.  Follow-up in clinic in  3 to 4 weeks or sooner if needed.  July 8 at 10:30 in the morning.  I have spent atleast 15 minutes non face to face with patient today. More than 50 % of the time was spent for psychoeducation and supportive psychotherapy and care coordination.  This note was generated in part or whole with voice recognition software. Voice recognition is usually quite accurate but there are transcription errors that can and very often do occur. I apologize for any typographical errors that were not  detected and corrected.        Ursula Alert, MD 12/14/2018, 5:51 PM

## 2018-12-30 ENCOUNTER — Other Ambulatory Visit: Payer: Self-pay | Admitting: Internal Medicine

## 2018-12-30 ENCOUNTER — Other Ambulatory Visit: Payer: Self-pay | Admitting: Psychiatry

## 2018-12-30 DIAGNOSIS — I10 Essential (primary) hypertension: Secondary | ICD-10-CM

## 2018-12-30 DIAGNOSIS — F411 Generalized anxiety disorder: Secondary | ICD-10-CM

## 2018-12-30 MED ORDER — OXYBUTYNIN CHLORIDE ER 10 MG PO TB24: 10.0000 mg | ORAL_TABLET | Freq: Every day | ORAL | 3 refills | Status: DC

## 2019-01-18 ENCOUNTER — Ambulatory Visit: Payer: Medicare HMO | Admitting: Psychiatry

## 2019-01-18 ENCOUNTER — Other Ambulatory Visit: Payer: Self-pay

## 2019-01-18 ENCOUNTER — Encounter: Payer: Self-pay | Admitting: Psychiatry

## 2019-01-18 ENCOUNTER — Ambulatory Visit (INDEPENDENT_AMBULATORY_CARE_PROVIDER_SITE_OTHER): Payer: Medicare HMO | Admitting: Psychiatry

## 2019-01-18 DIAGNOSIS — F330 Major depressive disorder, recurrent, mild: Secondary | ICD-10-CM | POA: Diagnosis not present

## 2019-01-18 DIAGNOSIS — F5105 Insomnia due to other mental disorder: Secondary | ICD-10-CM | POA: Diagnosis not present

## 2019-01-18 DIAGNOSIS — R69 Illness, unspecified: Secondary | ICD-10-CM | POA: Diagnosis not present

## 2019-01-18 DIAGNOSIS — F22 Delusional disorders: Secondary | ICD-10-CM | POA: Diagnosis not present

## 2019-01-18 DIAGNOSIS — F411 Generalized anxiety disorder: Secondary | ICD-10-CM | POA: Diagnosis not present

## 2019-01-18 DIAGNOSIS — Z8659 Personal history of other mental and behavioral disorders: Secondary | ICD-10-CM

## 2019-01-18 HISTORY — DX: Personal history of other mental and behavioral disorders: Z86.59

## 2019-01-18 NOTE — Progress Notes (Signed)
Virtual Visit via Telephone Note  I connected with Kristen Pennington on 01/18/19 at  1:00 PM EDT by telephone and verified that I am speaking with the correct person using two identifiers.   I discussed the limitations, risks, security and privacy concerns of performing an evaluation and management service by telephone and the availability of in person appointments. I also discussed with the patient that there may be a patient responsible charge related to this service. The patient expressed understanding and agreed to proceed.    I discussed the assessment and treatment plan with the patient. The patient was provided an opportunity to ask questions and all were answered. The patient agreed with the plan and demonstrated an understanding of the instructions.   The patient was advised to call back or seek an in-person evaluation if the symptoms worsen or if the condition fails to improve as anticipated.     [facility] MD OP Progress Note  01/18/2019 6:40 PM AOLANI PIGGOTT  MRN:  [id number]  Chief Complaint:  Chief Complaint    Follow-up     HPI: Kristen Pennington is an 85 year old Caucasian female, widowed, lives in [place], has a history of GAD, MDD, delusional disorder, insomnia, hearing loss, vision loss, obstructive sleep apnea, hypertension was evaluated by telemedicine today.  Patient today reports she is currently struggling with anxiety about her computer.  She reports she believes that someone is cleaning up every file on her computer.  She reports it does not bother her much however the cleaning system is unable to clean up whenever the Jerold Coombe has set up on the computer.  She reports this worries her.  She however reports she does not want any medication readjustment today and as long as she avoids her computer she feels okay.  Her daughter is going to take away her computer on Monday.  Patient reports some noncompliance with her medications.  She reports she stopped taking Abilify few days ago.   She reports she was doing good while on the Abilify however once she stopped taking it she has been feeling anxious.  Discussed with her to restart taking the Abilify.  Patient reports sleep is good on the mirtazapine however she does not take it regularly.  She reports the last dosage was 2 weeks ago since she had misplaced her medication.  Discussed with patient to start taking her medications as prescribed to help with her mood, and possible delusions.  Patient denies any suicidality, homicidality or perceptual disturbances.  Patient does report some possible constipation however she is on multiple medications which can do that.  She will have a discussion with her primary care regarding medication management for her constipation.  Patient appeared to be alert, oriented to person place and situation.  Discussed with patient to reach out to her children, also to reach out to other residents at the facility.  Patient denies any other concerns today. Visit Diagnosis:    ICD-10-CM   1. Generalized anxiety disorder  F41.1   2. MDD (major depressive disorder), recurrent episode, mild ([place])  F33.0   3. Delusional disorder ([place])  F22   4. Insomnia due to mental condition  F51.05     Past Psychiatric History: I have reviewed past psychiatric history from my progress note on 07/25/2018.  Past trials of Klonopin, Lexapro, Abilify, mirtazapine.  Past Medical History:  Past Medical History:  Diagnosis Date  . Arthritis   . Atrophic vaginitis 03/06/2013   Last Assessment & Plan:  She  will plan to continue estradiol vaginal cream.  Prescription was rewritten stipulating use of the generic product.   . Atypical migraine 03/06/2013   Last Assessment & Plan:  Since she rarely takes sumatriptan, we will discontinue it. Continue sparing use of OTC migraine products.   . Avitaminosis D 03/06/2013   Last Assessment & Plan:  Recheck vitamin D level. Plan to continue vitamin D supplementation.   . Ceratitis  08/01/2013   Last Assessment & Plan:  Because of the expense, she will discontinue Restasis. She will use over-the-counter moisturizing eyedrops and liquid gel.   . Colon polyp   . 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   . 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   . Cystocele, midline 03/06/2013  . Demoralization and apathy 03/06/2013   Last Assessment & Plan:  Since she has been taking bupropion only once a day, I will write the prescription with the correct instructions and correct quantity. She has done well on this for years and she is encouraged to take it regularly   . Depression   . Environmental allergies   . Epistaxis   . Essential (primary) hypertension 05/17/2013   Last Assessment & Plan:  Her blood pressure is well-controlled. We will plan to continue hydrochlorothiazide and benazepril.  Both are generic and should be affordable on her health plan.   Marland Kitchen GERD (gastroesophageal reflux disease)   . Hemorrhoid   . Inflammation of sacroiliac joint ([place]) 03/06/2013   Last Assessment & Plan:  She is doing well on her present regimen of fentanyl patch supplemented with sparing use of tramadol/acetaminophen. We will continue that regimen.   . Sinusitis   . Sleep apnea    CPAP    Past Surgical History:  Procedure Laterality Date  . COLONOSCOPY  05-03-15  . COLONOSCOPY WITH PROPOFOL N/A 05/03/2015   Procedure: COLONOSCOPY WITH PROPOFOL;  Surgeon: Lucilla Lame, MD;  Location: [facility];  Service: Endoscopy;  Laterality: N/A;  CPAP  . DILATION AND CURETTAGE OF UTERUS  1970  . ENDOMETRIAL BIOPSY    . EYE SURGERY Right 2017  . POLYPECTOMY  05/03/2015   Procedure: POLYPECTOMY;  Surgeon: Lucilla Lame, MD;  Location: [facility];  Service: Endoscopy;;  . ROTATOR CUFF REPAIR  2007  . SPINE SURGERY       Family Psychiatric History: I have reviewed family psychiatric history from my progress note on 07/25/2018.  Family History:  Family History  Problem Relation Age of Onset  . Arthritis Mother   . Alzheimer's disease Father   . Heart disease Sister   . Cancer Brother   . Ovarian cancer Neg Hx   . Breast cancer Neg Hx   . Colon cancer Neg Hx   . Diabetes Neg Hx     Social History: I have reviewed social history from my progress note on 07/25/2018. Social History   Socioeconomic History  . Marital status: Widowed    Spouse name: Not on file  . Number of children: 2  . Years of education: Not on file  . Highest education level: Some college, no degree  Occupational History  . Not on file  Social Needs  . Financial resource strain: Hard  . Food insecurity    Worry: Often true    Inability: Often true  . Transportation needs  Medical: No    Non-medical: No  Tobacco Use  . Smoking status: Never Smoker  . Smokeless tobacco: Never Used  Substance and Sexual Activity  . Alcohol use: No    Alcohol/week: 0.0 standard drinks  . Drug use: No  . Sexual activity: Not Currently  Lifestyle  . Physical activity    Days per week: 0 days    Minutes per session: 0 min  . Stress: To some extent  Relationships  . Social Herbalist on phone: Not on file    Gets together: Not on file    Attends religious service: More than 4 times per year    Active member of club or organization: No    Attends meetings of clubs or organizations: Never    Relationship status: Widowed  Other Topics Concern  . Not on file  Social History Narrative  . Not on file    Allergies:  Allergies  Allergen Reactions  . Linaclotide Swelling    Metabolic Disorder Labs: Lab Results  Component Value Date   HGBA1C 5.4 08/01/2018   Lab Results  Component Value Date   PROLACTIN 8.1 08/01/2018   Lab Results  Component Value Date   CHOL 245 (H) 08/01/2018   TRIG 187 (H) 08/01/2018    HDL 49 08/01/2018   LDLCALC 159 (H) 08/01/2018   Lab Results  Component Value Date   TSH 2.830 08/10/2018   TSH 5.550 (H) 08/01/2018    Therapeutic Level Labs: No results found for: LITHIUM No results found for: VALPROATE No components found for:  CBMZ  Current Medications: Current Outpatient Medications  Medication Sig Dispense Refill  . acetaminophen (TYLENOL) 500 MG tablet Take 500 mg by mouth every 6 (six) hours as needed.    . ARIPiprazole (ABILIFY) 2 MG tablet Take 1 tablet (2 mg total) by mouth daily. 90 tablet 0  . benazepril-hydrochlorthiazide (LOTENSIN HCT) 20-25 MG tablet Take by mouth.    . bisoprolol-hydrochlorothiazide (ZIAC) 2.5-6.25 MG tablet TAKE ONE TABLET BY MOUTH EVERY DAY FOR BLOOD PRESSURE (HIGH) 30 tablet 5  . Calcium Carb-Cholecalciferol 600-800 MG-UNIT TABS Take by mouth.    . Carboxymethylcellulose Sodium (THERATEARS OP) Apply to eye.    . Cholecalciferol (VITAMIN D3) 5000 units TABS Take 5,000 Units by mouth daily.    . clonazePAM (KLONOPIN) 0.5 MG tablet TAKE O.5 TABLETS BY MOUTH AS DIRECTED. TAKE 2-3 TIMES A WEEK ONLY AS NEEDED FORSEVERE ANXIETY ATTACKS. 15 tablet 2  . dicyclomine (BENTYL) 10 MG capsule Take 10 mg by mouth 4 (four) times daily -  before meals and at bedtime.    . docusate sodium (COLACE) 50 MG capsule Take 50 mg by mouth daily as needed for mild constipation. Mon, Wed, Fri    . escitalopram (LEXAPRO) 20 MG tablet Take 1 tablet (20 mg total) by mouth daily. 90 tablet 0  . estradiol (ESTRACE) 0.1 MG/GM vaginal cream Place vaginally as needed.     . fluticasone (FLONASE) 50 MCG/ACT nasal spray 1 SPRAY IN EACH NOSTRIL ONCE ADAY AS NEEDED 16 g 1  . ibuprofen (ADVIL,MOTRIN) 200 MG tablet Take 200 mg by mouth every 6 (six) hours as needed.    Marland Kitchen Lifitegrast (XIIDRA) 5 % SOLN Apply to eye.    . Liniments (SALONPAS PAIN RELIEF PATCH EX) Apply 1 patch topically 2 (two) times daily.    Marland Kitchen lubiprostone (AMITIZA) 8 MCG capsule Take 1 capsule po bid prn  ibs constipation. 60 capsule 3  . magnesium hydroxide (  MILK OF MAGNESIA) 400 MG/5ML suspension Take by mouth.    . mirtazapine (REMERON) 15 MG tablet Take 0.5 tablets (7.5 mg total) by mouth at bedtime. For sleep 45 tablet 0  . Multiple Vitamin (MULTI-VITAMIN) tablet Take by mouth.    . Multiple Vitamins-Minerals (CENTRUM SILVER 50+WOMEN) TABS Take by mouth.    . oxybutynin (DITROPAN-XL) 10 MG 24 hr tablet Take 1 tablet (10 mg total) by mouth daily. 30 tablet 3  . pantoprazole (PROTONIX) 40 MG tablet Take 1 tablet (40 mg total) by mouth daily. 90 tablet 1  . promethazine (PHENERGAN) 25 MG tablet Take by mouth.    . RESTASIS 0.05 % ophthalmic emulsion     . rOPINIRole (REQUIP) 1 MG tablet TAKE ONE TABLET 3 TIMES DAILY AS NEEDED FOR RESTLESS LEGS 90 tablet 3  . shark liver oil-cocoa butter (PREPARATION H) 0.25-3-85.5 % suppository Place 1 suppository rectally as needed for hemorrhoids.    . shark liver oil-cocoa butter (PREPARATION H) 0.25-88.44 % suppository Place rectally.    . simethicone (MYLICON) 80 MG chewable tablet Chew by mouth.    . trolamine salicylate (ASPERCREME) 10 % cream Apply topically.     No current facility-administered medications for this visit.      Musculoskeletal: Strength & Muscle Tone: UTA Gait & Station: Reports she has to use a cane at times Patient leans: N/A  Psychiatric Specialty Exam: Review of Systems  Psychiatric/Behavioral: The patient is nervous/anxious.   All other systems reviewed and are negative.   There were no vitals taken for this visit.There is no height or weight on file to calculate BMI.  General Appearance: UTA  Eye Contact:  UTA  Speech:  Clear and Coherent  Volume:  Normal  Mood:  Anxious on and off   Affect:  UTA  Thought Process:  Goal Directed and Descriptions of Associations: Intact  Orientation:  Full (Time, Place, and Person)  Thought Content: Delusions   Suicidal Thoughts:  No  Homicidal Thoughts:  No  Memory:  Immediate;    Fair Recent;   Fair Remote;   Fair  Judgement:  Fair  Insight:  Fair  Psychomotor Activity:  UTA  Concentration:  Concentration: Fair and Attention Span: Fair  Recall:  [employer] of Knowledge: Fair  Language: Fair  Akathisia:  No  Handed:  Right  AIMS (if indicated): denies tremors, rigidity  Assets:  Communication Skills Desire for Improvement Housing Social Support  ADL's:  Intact  Cognition: WNL  Sleep:  Fair when she takes her medications   Screenings: Mini-Mental     Clinical Support from 03/02/2018 in [facility], [facility]  Total Score (max 30 points )  27    PHQ2-9     Office Visit from 11/08/2018 in [facility], [facility] Office Visit from 08/08/2018 in [facility], [facility] Office Visit from 06/30/2018 in [facility], [facility] Office Visit from 05/02/2018 in [facility], [place] from 03/02/2018 in [facility], [facility]  PHQ-2 Total Score  0  1  0  0  0       Assessment and Plan:Kristen Pennington is an 85 year old female, widowed, has a history of GAD, MDD, history of psychosis, essential hypertension, chronic pain, OSA, vision loss, hearing loss was evaluated by phone today.  Patient with psychosocial stressors of current COVID-19 outbreak, however health problems.  Patient continues to live in a senior living community.  She also has support from her children.  Patient however  continues to worry about being away from her children, not being able to see them often as well as her own health issues.  Patient also with possible delusions which she has had since the past few months.  Patient with noncompliance on her medications which would have contributed to worsening symptoms.  Discussed with patient to restart her Abilify.  Also encourage compliance.  Discussed with patient that writer will contact her son or her daughter, Kyung Rudd or Marnette Burgess -to discuss patient care.  Plan For GAD- some progress Lexapro 20 mg p.o.  daily Abilify 2 mg p.o. daily.  Discussed with patient to stay compliant on the medication. She continues to cut back clonazepam and has not been using it much.  For MDD- improving Lexapro as prescribed Abilify 2 mg p.o. daily.  Delusional disorder- unstable Abilify 2 mg p.o. daily  For insomnia-restless Patient noncompliant on mirtazapine.  However reports when she takes it she sleeps good. Continue mirtazapine 7.5 mg p.o. nightly.  I have tried to reach her son Kyung Rudd at [phone] as well as Cindy at [phone].  Left message to call writer back to discuss patient care.  Follow-up in clinic in 1 month or sooner if needed.  August 26 at 2:30 PM  I have spent atleast 25 minutes non face to face with patient today. More than 50 % of the time was spent for psychoeducation and supportive psychotherapy and care coordination.  This note was generated in part or whole with voice recognition software. Voice recognition is usually quite accurate but there are transcription errors that can and very often do occur. I apologize for any typographical errors that were not detected and corrected.       Ursula Alert, MD 01/18/2019, 6:40 PM

## 2019-01-19 ENCOUNTER — Telehealth: Payer: Self-pay

## 2019-01-19 NOTE — Telephone Encounter (Signed)
Spoke to [place] about concerns about medication noncompliance as well as possible delusions to do with computer . Encouraged daughter to supervise her medications as well as possibly take away the computer to make her less anxious.

## 2019-01-19 NOTE — Telephone Encounter (Signed)
message was left that she was returning your call from yesterday.

## 2019-01-19 NOTE — Telephone Encounter (Signed)
Kristen Pennington states he was returning your call.

## 2019-01-23 DIAGNOSIS — H16223 Keratoconjunctivitis sicca, not specified as Sjogren's, bilateral: Secondary | ICD-10-CM | POA: Diagnosis not present

## 2019-01-26 ENCOUNTER — Other Ambulatory Visit: Payer: Self-pay

## 2019-01-26 MED ORDER — ESTRADIOL 0.1 MG/GM VA CREA: TOPICAL_CREAM | VAGINAL | 1 refills | Status: DC

## 2019-03-06 ENCOUNTER — Other Ambulatory Visit: Payer: Self-pay

## 2019-03-06 ENCOUNTER — Ambulatory Visit (INDEPENDENT_AMBULATORY_CARE_PROVIDER_SITE_OTHER): Payer: Medicare HMO | Admitting: Adult Health

## 2019-03-06 ENCOUNTER — Encounter: Payer: Self-pay | Admitting: Adult Health

## 2019-03-06 VITALS — BP 140/78 | HR 60 | Resp 16 | Ht 66.0 in | Wt 156.0 lb

## 2019-03-06 DIAGNOSIS — R21 Rash and other nonspecific skin eruption: Secondary | ICD-10-CM | POA: Diagnosis not present

## 2019-03-06 DIAGNOSIS — G4733 Obstructive sleep apnea (adult) (pediatric): Secondary | ICD-10-CM | POA: Diagnosis not present

## 2019-03-06 DIAGNOSIS — R300 Dysuria: Secondary | ICD-10-CM

## 2019-03-06 DIAGNOSIS — F411 Generalized anxiety disorder: Secondary | ICD-10-CM

## 2019-03-06 DIAGNOSIS — I10 Essential (primary) hypertension: Secondary | ICD-10-CM | POA: Diagnosis not present

## 2019-03-06 DIAGNOSIS — Z0001 Encounter for general adult medical examination with abnormal findings: Secondary | ICD-10-CM

## 2019-03-06 DIAGNOSIS — E785 Hyperlipidemia, unspecified: Secondary | ICD-10-CM

## 2019-03-06 DIAGNOSIS — Z9989 Dependence on other enabling machines and devices: Secondary | ICD-10-CM

## 2019-03-06 DIAGNOSIS — K219 Gastro-esophageal reflux disease without esophagitis: Secondary | ICD-10-CM | POA: Diagnosis not present

## 2019-03-06 DIAGNOSIS — R69 Illness, unspecified: Secondary | ICD-10-CM | POA: Diagnosis not present

## 2019-03-06 NOTE — Progress Notes (Signed)
[facility] [place], [place] [address]  Internal MEDICINE  Office Visit Note  Patient Name: Kristen Pennington  [id number]  [id number]  Date of Service: 03/06/2019  Chief Complaint  Patient presents with  . Annual Exam  . Hypertension  . Hyperlipidemia     HPI Pt is here for routine health maintenance examination.  Patient is a well-appearing 85 year old Caucasian female.  She has a history of hypertension, hyperlipidemia, anxiety, OSA, and GERD.  Overall patient reports she is doing well and she denies any complaints at this time.  She has not been hospitalized recently and overall feels very good.  She is mobile and walked to the office today from her apartment complex nearby.  She does not smoke cigarettes drink alcohol or use any illicit drugs.  Current Medication: Outpatient Encounter Medications as of 03/06/2019  Medication Sig Note  . acetaminophen (TYLENOL) 500 MG tablet Take 500 mg by mouth every 6 (six) hours as needed.   . ARIPiprazole (ABILIFY) 2 MG tablet Take 1 tablet (2 mg total) by mouth daily.   . bisoprolol-hydrochlorothiazide (ZIAC) 2.5-6.25 MG tablet TAKE ONE TABLET BY MOUTH EVERY DAY FOR BLOOD PRESSURE (HIGH)   . Calcium Carb-Cholecalciferol 600-800 MG-UNIT TABS Take by mouth.   . Carboxymethylcellulose Sodium (THERATEARS OP) Apply to eye.   . Cholecalciferol (VITAMIN D3) 5000 units TABS Take 5,000 Units by mouth daily.   Marland Kitchen dicyclomine (BENTYL) 10 MG capsule Take 10 mg by mouth 4 (four) times daily -  before meals and at bedtime.   . docusate sodium (COLACE) 50 MG capsule Take 50 mg by mouth daily as needed for mild constipation. Mon, Wed, Fri   . escitalopram (LEXAPRO) 20 MG tablet Take 1 tablet (20 mg total) by mouth daily.   Marland Kitchen estradiol (ESTRACE) 0.1 MG/GM vaginal cream Use once a week as needed   . fluticasone (FLONASE) 50 MCG/ACT nasal spray 1 SPRAY IN EACH NOSTRIL ONCE ADAY AS NEEDED   . ibuprofen (ADVIL,MOTRIN) 200 MG tablet Take  200 mg by mouth every 6 (six) hours as needed.   Marland Kitchen Lifitegrast (XIIDRA) 5 % SOLN Apply to eye.   . Liniments (SALONPAS PAIN RELIEF PATCH EX) Apply 1 patch topically 2 (two) times daily.   Marland Kitchen lubiprostone (AMITIZA) 8 MCG capsule Take 1 capsule po bid prn ibs constipation.   . Multiple Vitamin (MULTI-VITAMIN) tablet Take by mouth.   . Multiple Vitamins-Minerals (CENTRUM SILVER 50+WOMEN) TABS Take by mouth.   . oxybutynin (DITROPAN-XL) 10 MG 24 hr tablet Take 1 tablet (10 mg total) by mouth daily.   . promethazine (PHENERGAN) 25 MG tablet Take by mouth.   . RESTASIS 0.05 % ophthalmic emulsion    . rOPINIRole (REQUIP) 1 MG tablet TAKE ONE TABLET 3 TIMES DAILY AS NEEDED FOR RESTLESS LEGS   . simethicone (MYLICON) 80 MG chewable tablet Chew by mouth.   . [DISCONTINUED] magnesium hydroxide (MILK OF MAGNESIA) 400 MG/5ML suspension Take by mouth.   . mirtazapine (REMERON) 15 MG tablet Take 0.5 tablets (7.5 mg total) by mouth at bedtime. For sleep   . pantoprazole (PROTONIX) 40 MG tablet Take 1 tablet (40 mg total) by mouth daily. (Patient not taking: Reported on 03/06/2019)   . shark liver oil-cocoa butter (PREPARATION H) 0.25-3-85.5 % suppository Place 1 suppository rectally as needed for hemorrhoids. 05/03/2015: PRN  . shark liver oil-cocoa butter (PREPARATION H) 0.25-88.44 % suppository Place rectally.   . trolamine salicylate (ASPERCREME) 10 % cream Apply topically. 04/08/2016: Received  from: [place]: Apply topically as needed.  . [DISCONTINUED] benazepril-hydrochlorthiazide (LOTENSIN HCT) 20-25 MG tablet Take by mouth.   . [DISCONTINUED] clonazePAM (KLONOPIN) 0.5 MG tablet TAKE O.5 TABLETS BY MOUTH AS DIRECTED. TAKE 2-3 TIMES A WEEK ONLY AS NEEDED FORSEVERE ANXIETY ATTACKS. (Patient not taking: Reported on 03/06/2019)    No facility-administered encounter medications on file as of 03/06/2019.     Surgical History: Past Surgical History:  Procedure Laterality Date   . COLONOSCOPY  05-03-15  . COLONOSCOPY WITH PROPOFOL N/A 05/03/2015   Procedure: COLONOSCOPY WITH PROPOFOL;  Surgeon: Lucilla Lame, MD;  Location: [place] [facility];  Service: Endoscopy;  Laterality: N/A;  CPAP  . DILATION AND CURETTAGE OF UTERUS  1970  . ENDOMETRIAL BIOPSY    . EYE SURGERY Right 2017  . POLYPECTOMY  05/03/2015   Procedure: POLYPECTOMY;  Surgeon: Lucilla Lame, MD;  Location: [place];  Service: Endoscopy;;  . ROTATOR CUFF REPAIR  2007  . SPINE SURGERY      Medical History: Past Medical History:  Diagnosis Date  . Arthritis   . Atrophic vaginitis 03/06/2013   Last Assessment & Plan:  She will plan to continue estradiol vaginal cream.  Prescription was rewritten stipulating use of the generic product.   . Atypical migraine 03/06/2013   Last Assessment & Plan:  Since she rarely takes sumatriptan, we will discontinue it. Continue sparing use of OTC migraine products.   . Avitaminosis D 03/06/2013   Last Assessment & Plan:  Recheck vitamin D level. Plan to continue vitamin D supplementation.   . Ceratitis 08/01/2013   Last Assessment & Plan:  Because of the expense, she will discontinue Restasis. She will use over-the-counter moisturizing eyedrops and liquid gel.   . Colon polyp   . 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   . 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   . Cystocele, midline 03/06/2013  . Demoralization and apathy 03/06/2013   Last Assessment & Plan:  Since she has been taking bupropion only once a day, I will write the prescription with the correct instructions and correct quantity. She has done well on this for years and she is encouraged to take it regularly   . Depression   . Environmental allergies   . Epistaxis   . Essential (primary) hypertension 05/17/2013    Last Assessment & Plan:  Her blood pressure is well-controlled. We will plan to continue hydrochlorothiazide and benazepril.  Both are generic and should be affordable on her health plan.   Marland Kitchen GERD (gastroesophageal reflux disease)   . Hemorrhoid   . Inflammation of sacroiliac joint ([place]) 03/06/2013   Last Assessment & Plan:  She is doing well on her present regimen of fentanyl patch supplemented with sparing use of tramadol/acetaminophen. We will continue that regimen.   . Sinusitis   . Sleep apnea    CPAP    Family History: Family History  Problem Relation Age of Onset  . Arthritis Mother   . Alzheimer's disease Father   . Heart disease Sister   . Cancer Brother   . Ovarian cancer Neg Hx   . Breast cancer Neg Hx   . Colon cancer Neg Hx   . Diabetes Neg Hx       Review of Systems  Constitutional: Negative for chills,  fatigue and unexpected weight change.  HENT: Negative for congestion, rhinorrhea, sneezing and sore throat.   Eyes: Negative for photophobia, pain and redness.  Respiratory: Negative for cough, chest tightness and shortness of breath.   Cardiovascular: Negative for chest pain and palpitations.  Gastrointestinal: Negative for abdominal pain, constipation, diarrhea, nausea and vomiting.  Endocrine: Negative.   Genitourinary: Negative for dysuria and frequency.  Musculoskeletal: Negative for arthralgias, back pain, joint swelling and neck pain.  Skin: Negative for rash.  Allergic/Immunologic: Negative.   Neurological: Negative for tremors and numbness.  Hematological: Negative for adenopathy. Does not bruise/bleed easily.  Psychiatric/Behavioral: Negative for behavioral problems and sleep disturbance. The patient is not nervous/anxious.      Vital Signs: BP 140/78   Pulse 60   Resp 16   Ht 5\' 6"  (1.676 m)   Wt 156 lb (70.8 kg)   SpO2 98%   BMI 25.18 kg/m    Physical Exam Vitals signs and nursing note reviewed.  Constitutional:      General: She is not  in acute distress.    Appearance: She is well-developed. She is not diaphoretic.  HENT:     Head: Normocephalic and atraumatic.     Mouth/Throat:     Pharynx: No oropharyngeal exudate.  Eyes:     Pupils: Pupils are equal, round, and reactive to light.  Neck:     Musculoskeletal: Normal range of motion and neck supple.     Thyroid: No thyromegaly.     Vascular: No JVD.     Trachea: No tracheal deviation.  Cardiovascular:     Rate and Rhythm: Normal rate and regular rhythm.     Heart sounds: Normal heart sounds. No murmur. No friction rub. No gallop.   Pulmonary:     Effort: Pulmonary effort is normal. No respiratory distress.     Breath sounds: Normal breath sounds. No wheezing or rales.  Chest:     Chest wall: No tenderness.  Abdominal:     Palpations: Abdomen is soft.     Tenderness: There is no abdominal tenderness. There is no guarding.  Musculoskeletal: Normal range of motion.  Lymphadenopathy:     Cervical: No cervical adenopathy.  Skin:    General: Skin is warm and dry.  Neurological:     Mental Status: She is alert and oriented to person, place, and time.     Cranial Nerves: No cranial nerve deficit.  Psychiatric:        Behavior: Behavior normal.        Thought Content: Thought content normal.        Judgment: Judgment normal.      LABS: No results found for this or any previous visit (from the past 2160 hour(s)).    Assessment/Plan: 1. Encounter for general adult medical examination with abnormal findings Up to date on PHM.  - CBC with Differential/Platelet - Lipid Panel With LDL/HDL Ratio - TSH - T4, free - Comprehensive metabolic panel  2. Dysuria Urine dip.  3. Essential (primary) hypertension Stable, continue current medications.   4. Hyperlipidemia, unspecified hyperlipidemia type We will get lipid panel to evaluate current levels.  5. Gastroesophageal reflux disease without esophagitis Stable, continue present medications  6.  Generalized anxiety disorder Seems to be well controlled at this time.  Patient will continue to see psychiatry and follow recommendations.  7. OSA on CPAP Controlled patient continues to wear CPAP.  We discussed ongoing compliance with CPAP therapy.  8. Rash and nonspecific skin eruption Patient  has 1 or 2 places of concern on her back and flank she would like to see dermatology for.  I encouraged her to use a topical medication like Neosporin but she was insistent upon seeing dermatology. - Ambulatory referral to Dermatology  General Counseling: anacani wess understanding of the findings of todays visit and agrees with plan of treatment. I have discussed any further diagnostic evaluation that may be needed or ordered today. We also reviewed her medications today. she has been encouraged to call the office with any questions or concerns that should arise related to todays visit.   Orders Placed This Encounter  Procedures  . Urinalysis, Routine w reflex microscopic    No orders of the defined types were placed in this encounter.   Time spent: 35 Minutes   This patient was seen by Orson Gear AGNP-C in Collaboration with Dr Lavera Guise as a part of collaborative care agreement    Kendell Bane AGNP-C Internal Medicine

## 2019-03-08 ENCOUNTER — Encounter: Payer: Self-pay | Admitting: Psychiatry

## 2019-03-08 ENCOUNTER — Ambulatory Visit (INDEPENDENT_AMBULATORY_CARE_PROVIDER_SITE_OTHER): Payer: Medicare HMO | Admitting: Psychiatry

## 2019-03-08 ENCOUNTER — Other Ambulatory Visit: Payer: Self-pay

## 2019-03-08 DIAGNOSIS — R69 Illness, unspecified: Secondary | ICD-10-CM | POA: Diagnosis not present

## 2019-03-08 DIAGNOSIS — F22 Delusional disorders: Secondary | ICD-10-CM | POA: Insufficient documentation

## 2019-03-08 DIAGNOSIS — F5105 Insomnia due to other mental disorder: Secondary | ICD-10-CM | POA: Diagnosis not present

## 2019-03-08 DIAGNOSIS — F330 Major depressive disorder, recurrent, mild: Secondary | ICD-10-CM | POA: Diagnosis not present

## 2019-03-08 DIAGNOSIS — F411 Generalized anxiety disorder: Secondary | ICD-10-CM | POA: Diagnosis not present

## 2019-03-08 MED ORDER — ARIPIPRAZOLE 2 MG PO TABS: 1.0000 mg | ORAL_TABLET | Freq: Every day | ORAL | 0 refills | Status: DC

## 2019-03-08 NOTE — Progress Notes (Signed)
Virtual Visit via Telephone Note  I connected with Kristen Pennington on 03/08/19 at  2:30 PM EDT by telephone and verified that I am speaking with the correct person using two identifiers.   I discussed the limitations, risks, security and privacy concerns of performing an evaluation and management service by telephone and the availability of in person appointments. I also discussed with the patient that there may be a patient responsible charge related to this service. The patient expressed understanding and agreed to proceed.    I discussed the assessment and treatment plan with the patient. The patient was provided an opportunity to ask questions and all were answered. The patient agreed with the plan and demonstrated an understanding of the instructions.   The patient was advised to call back or seek an in-person evaluation if the symptoms worsen or if the condition fails to improve as anticipated.   Butler MD OP Progress Note  03/08/2019 5:52 PM Kristen Pennington  MRN:  [id number]  Chief Complaint:  Chief Complaint    Follow-up     HPI: Kristen Pennington is a 85 year old Caucasian female, widowed, lives in [place], has a history of GAD, MDD, delusional disorder, insomnia, hearing loss, vision loss, OSA, hypertension was evaluated by phone today.  Patient preferred to do a phone call.  She reports she is currently doing okay with regards to her anxiety and depression.  She denies any significant mood lability.  She reports sleep is okay.  She however reports she has been struggling with some dizziness.  She had an appointment with her primary care provider who recommended that she get her vision checked and get help with the same.  She does not seem to be too preoccupied with her delusions about her computer at this visit.  She denies any perceptual disturbances.  She denies any suicidality or homicidality.  Discussed readjusting her medication dosages.  She reports she is compliant only on  the Abilify and Lexapro.  She no longer takes the mirtazapine and has been sleeping without it.   Visit Diagnosis:    ICD-10-CM   1. Generalized anxiety disorder  F41.1 ARIPiprazole (ABILIFY) 2 MG tablet  2. MDD (major depressive disorder), recurrent episode, mild ([place])  F33.0   3. Delusional disorder ([place])  F22   4. Insomnia due to mental condition  F51.05     Past Psychiatric History: I have reviewed past psychiatric history from my progress note on 07/25/2018.  Past trials of Klonopin, Lexapro, Abilify, mirtazapine  Past Medical History:  Past Medical History:  Diagnosis Date  . Arthritis   . Atrophic vaginitis 03/06/2013   Last Assessment & Plan:  She will plan to continue estradiol vaginal cream.  Prescription was rewritten stipulating use of the generic product.   . Atypical migraine 03/06/2013   Last Assessment & Plan:  Since she rarely takes sumatriptan, we will discontinue it. Continue sparing use of OTC migraine products.   . Avitaminosis D 03/06/2013   Last Assessment & Plan:  Recheck vitamin D level. Plan to continue vitamin D supplementation.   . Ceratitis 08/01/2013   Last Assessment & Plan:  Because of the expense, she will discontinue Restasis. She will use over-the-counter moisturizing eyedrops and liquid gel.   . Colon polyp   . 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   . 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   . Cystocele, midline 03/06/2013  . Demoralization and apathy 03/06/2013   Last Assessment & Plan:  Since she has been taking bupropion only once a day, I will write the prescription with the correct instructions and correct quantity. She has done well on this for years and she is encouraged to take it regularly   . Depression   . Environmental allergies   . Epistaxis   . Essential  (primary) hypertension 05/17/2013   Last Assessment & Plan:  Her blood pressure is well-controlled. We will plan to continue hydrochlorothiazide and benazepril.  Both are generic and should be affordable on her health plan.   Marland Kitchen GERD (gastroesophageal reflux disease)   . Hemorrhoid   . Inflammation of sacroiliac joint ([place]) 03/06/2013   Last Assessment & Plan:  She is doing well on her present regimen of fentanyl patch supplemented with sparing use of tramadol/acetaminophen. We will continue that regimen.   . Sinusitis   . Sleep apnea    CPAP    Past Surgical History:  Procedure Laterality Date  . COLONOSCOPY  05-03-15  . COLONOSCOPY WITH PROPOFOL N/A 05/03/2015   Procedure: COLONOSCOPY WITH PROPOFOL;  Surgeon: Lucilla Lame, MD;  Location: [place];  Service: Endoscopy;  Laterality: N/A;  CPAP  . DILATION AND CURETTAGE OF UTERUS  1970  . ENDOMETRIAL BIOPSY    . EYE SURGERY Right 2017  . POLYPECTOMY  05/03/2015   Procedure: POLYPECTOMY;  Surgeon: Lucilla Lame, MD;  Location: [place];  Service: Endoscopy;;  . ROTATOR CUFF REPAIR  2007  . SPINE SURGERY      Family Psychiatric History: I have reviewed family psychiatric history from my progress note on 07/25/2018.  Family History:  Family History  Problem Relation Age of Onset  . Arthritis Mother   . Alzheimer's disease Father   . Heart disease Sister   . Cancer Brother   . Ovarian cancer Neg Hx   . Breast cancer Neg Hx   . Colon cancer Neg Hx   . Diabetes Neg Hx     Social History: I have reviewed social history from my progress note on 07/25/2018. Social History   Socioeconomic History  . Marital status: Widowed    Spouse name: Not on file  . Number of children: 2  . Years of education: Not on file  . Highest education level: Some college, no degree  Occupational History  . Not on file  Social Needs  . Financial resource strain: Hard  . Food insecurity    Worry: Often true    Inability: Often true   . Transportation needs    Medical: No    Non-medical: No  Tobacco Use  . Smoking status: Never Smoker  . Smokeless tobacco: Never Used  Substance and Sexual Activity  . Alcohol use: No    Alcohol/week: 0.0 standard drinks  . Drug use: No  . Sexual activity: Not Currently  Lifestyle  . Physical activity    Days per week: 0 days    Minutes per session: 0 min  . Stress: To some extent  Relationships  . Social Herbalist on phone: Not on file    Gets together: Not on file    Attends religious service: More than 4 times per year    Active member of club or organization: No    Attends meetings of clubs or organizations: Never    Relationship status: Widowed  Other  Topics Concern  . Not on file  Social History Narrative  . Not on file    Allergies:  Allergies  Allergen Reactions  . Linaclotide Swelling    Metabolic Disorder Labs: Lab Results  Component Value Date   HGBA1C 5.4 08/01/2018   Lab Results  Component Value Date   PROLACTIN 8.1 08/01/2018   Lab Results  Component Value Date   CHOL 245 (H) 08/01/2018   TRIG 187 (H) 08/01/2018   HDL 49 08/01/2018   LDLCALC 159 (H) 08/01/2018   Lab Results  Component Value Date   TSH 2.830 08/10/2018   TSH 5.550 (H) 08/01/2018    Therapeutic Level Labs: No results found for: LITHIUM No results found for: VALPROATE No components found for:  CBMZ  Current Medications: Current Outpatient Medications  Medication Sig Dispense Refill  . acetaminophen (TYLENOL) 500 MG tablet Take 500 mg by mouth every 6 (six) hours as needed.    . ARIPiprazole (ABILIFY) 2 MG tablet Take 0.5 tablets (1 mg total) by mouth daily. 45 tablet 0  . bisoprolol-hydrochlorothiazide (ZIAC) 2.5-6.25 MG tablet TAKE ONE TABLET BY MOUTH EVERY DAY FOR BLOOD PRESSURE (HIGH) 30 tablet 5  . Calcium Carb-Cholecalciferol 600-800 MG-UNIT TABS Take by mouth.    . Carboxymethylcellulose Sodium (THERATEARS OP) Apply to eye.    . Cholecalciferol  (VITAMIN D3) 5000 units TABS Take 5,000 Units by mouth daily.    Marland Kitchen dicyclomine (BENTYL) 10 MG capsule Take 10 mg by mouth 4 (four) times daily -  before meals and at bedtime.    . docusate sodium (COLACE) 50 MG capsule Take 50 mg by mouth daily as needed for mild constipation. Mon, Wed, Fri    . escitalopram (LEXAPRO) 20 MG tablet Take 1 tablet (20 mg total) by mouth daily. 90 tablet 0  . estradiol (ESTRACE) 0.1 MG/GM vaginal cream Use once a week as needed 42.5 g 1  . fluticasone (FLONASE) 50 MCG/ACT nasal spray 1 SPRAY IN EACH NOSTRIL ONCE ADAY AS NEEDED 16 g 1  . ibuprofen (ADVIL,MOTRIN) 200 MG tablet Take 200 mg by mouth every 6 (six) hours as needed.    Marland Kitchen Lifitegrast (XIIDRA) 5 % SOLN Apply to eye.    . Liniments (SALONPAS PAIN RELIEF PATCH EX) Apply 1 patch topically 2 (two) times daily.    Marland Kitchen lubiprostone (AMITIZA) 8 MCG capsule Take 1 capsule po bid prn ibs constipation. 60 capsule 3  . Multiple Vitamin (MULTI-VITAMIN) tablet Take by mouth.    . Multiple Vitamins-Minerals (CENTRUM SILVER 50+WOMEN) TABS Take by mouth.    . oxybutynin (DITROPAN-XL) 10 MG 24 hr tablet Take 1 tablet (10 mg total) by mouth daily. 30 tablet 3  . pantoprazole (PROTONIX) 40 MG tablet Take 1 tablet (40 mg total) by mouth daily. (Patient not taking: Reported on 03/06/2019) 90 tablet 1  . promethazine (PHENERGAN) 25 MG tablet Take by mouth.    . RESTASIS 0.05 % ophthalmic emulsion     . rOPINIRole (REQUIP) 1 MG tablet TAKE ONE TABLET 3 TIMES DAILY AS NEEDED FOR RESTLESS LEGS 90 tablet 3  . simethicone (MYLICON) 80 MG chewable tablet Chew by mouth.    . trolamine salicylate (ASPERCREME) 10 % cream Apply topically.     No current facility-administered medications for this visit.      Musculoskeletal: Strength & Muscle Tone: UTA Gait & Station: Reports as WNL Patient leans: N/A  Psychiatric Specialty Exam: Review of Systems  Neurological: Positive for dizziness.  Psychiatric/Behavioral: The patient is  nervous/anxious.   All other systems reviewed and are negative.   There were no vitals taken for this visit.There is no height or weight on file to calculate BMI.  General Appearance: UTA  Eye Contact:  UTA  Speech:  Clear and Coherent  Volume:  Normal  Mood:  Anxious  Affect:  UTA  Thought Process:  Goal Directed and Descriptions of Associations: Intact  Orientation:  Full (Time, Place, and Person)  Thought Content: Logical   Suicidal Thoughts:  No  Homicidal Thoughts:  No  Memory:  Immediate;   Fair Recent;   Fair Remote;   Fair  Judgement:  Fair  Insight:  Fair  Psychomotor Activity:  UTA  Concentration:  Concentration: Fair and Attention Span: Fair  Recall:  [employer] of Knowledge: Fair  Language: Fair  Akathisia:  No  Handed:  Right  AIMS (if indicated): Denies tremors, rigidity,stiffness  Assets:  Communication Skills Desire for Improvement Social Support  ADL's:  Intact  Cognition: WNL  Sleep:  Fair   Screenings: Mini-Mental     Clinical Support from 03/06/2019 in [facility], [place] from 03/02/2018 in [facility], [facility]  Total Score (max 30 points )  30  27    PHQ2-9     Clinical Support from 03/06/2019 in [facility], [facility] Office Visit from 11[facility]08/2018 in [facility], [facility] Office Visit from 08/08/2018 in [facility], [facility] Office Visit from 06/30/2018 in [facility], [facility] Office Visit from 05/02/2018 in [facility], [facility]  PHQ-2 Total Score  0  0  1  0  0       Assessment and Plan: [place] is an 85 year old female, widowed, has a history of GAD, MDD, history of psychosis, essential hypertension, chronic pain, OSA, vision loss, hearing loss was evaluated by phone today.  Patient with psychosocial stressors of current pandemic, health issues.  Patient reports she is making progress with regards to her mood however is currently struggling with dizziness.  Will readjust her  medications.  Plan GAD-improving Continue Lexapro 20 mg p.o. daily Reduce Abilify to 1 mg p.o. daily.  For MDD-improving Lexapro as prescribed Reduce Abilify to 1 mg p.o. daily  Delusional disorder- improved Reduce Abilify to 1 mg p.o. daily  For insomnia-improving Patient is noncompliant on mirtazapine.  She reports she is sleeping good without it.  Will discontinue mirtazapine since she also has dizziness.  Follow-up in clinic in 2 months or sooner if needed.  October 9 at 10 AM   I have spent atleast 15 minutes non  face to face with patient today. More than 50 % of the time was spent for psychoeducation and supportive psychotherapy and care coordination. This note was generated in part or whole with voice recognition software. Voice recognition is usually quite accurate but there are transcription errors that can and very often do occur. I apologize for any typographical errors that were not detected and corrected.       Ursula Alert, MD 03/08/2019, 5:52 PM

## 2019-03-21 ENCOUNTER — Encounter: Payer: Self-pay | Admitting: Internal Medicine

## 2019-03-21 ENCOUNTER — Other Ambulatory Visit: Payer: Self-pay

## 2019-03-21 ENCOUNTER — Ambulatory Visit (INDEPENDENT_AMBULATORY_CARE_PROVIDER_SITE_OTHER): Payer: Medicare HMO | Admitting: Internal Medicine

## 2019-03-21 VITALS — BP 134/70 | HR 95 | Resp 16 | Ht 65.0 in | Wt 156.0 lb

## 2019-03-21 DIAGNOSIS — G4733 Obstructive sleep apnea (adult) (pediatric): Secondary | ICD-10-CM

## 2019-03-21 DIAGNOSIS — K219 Gastro-esophageal reflux disease without esophagitis: Secondary | ICD-10-CM | POA: Diagnosis not present

## 2019-03-21 DIAGNOSIS — R21 Rash and other nonspecific skin eruption: Secondary | ICD-10-CM | POA: Diagnosis not present

## 2019-03-21 MED ORDER — HYDROCORTISONE 0.5 % EX CREA: 1.0000 "application " | TOPICAL_CREAM | Freq: Two times a day (BID) | CUTANEOUS | 0 refills | Status: DC

## 2019-03-21 NOTE — Progress Notes (Signed)
[facility] [place],  [id number]  Pulmonary Sleep Medicine   Office Visit Note  Patient Name: Kristen Pennington DOB: [birth year] MRN [id number]  Date of Service: 03/21/2019  Complaints/HPI: She has been having some break out on his her face. She states that she just got a new mask recently and noted that she had incrash on her face. She was wondering if she can try nasal pillows.  She states that she does not really have any previous allergies to any plastics and she has been using the same mask without any issues.  She recently received a new mask and this has caused a rash developed above her lips.  She is not had any changes in her make-up or soap or detergent.  ROS  General: (-) fever, (-) chills, (-) night sweats, (-) weakness Skin: (-) rashes, (-) itching,. Eyes: (-) visual changes, (-) redness, (-) itching. Nose and Sinuses: (-) nasal stuffiness or itchiness, (-) postnasal drip, (-) nosebleeds, (-) sinus trouble. Mouth and Throat: (-) sore throat, (-) hoarseness. Neck: (-) swollen glands, (-) enlarged thyroid, (-) neck pain. Respiratory: - cough, (-) bloody sputum, - shortness of breath, - wheezing. Cardiovascular: - ankle swelling, (-) chest pain. Lymphatic: (-) lymph node enlargement. Neurologic: (-) numbness, (-) tingling. Psychiatric: (-) anxiety, (-) depression   Current Medication: Outpatient Encounter Medications as of 03/21/2019  Medication Sig Note  . acetaminophen (TYLENOL) 500 MG tablet Take 500 mg by mouth every 6 (six) hours as needed.   . ARIPiprazole (ABILIFY) 2 MG tablet Take 0.5 tablets (1 mg total) by mouth daily.   . bisoprolol-hydrochlorothiazide (ZIAC) 2.5-6.25 MG tablet TAKE ONE TABLET BY MOUTH EVERY DAY FOR BLOOD PRESSURE (HIGH)   . Calcium Carb-Cholecalciferol 600-800 MG-UNIT TABS Take by mouth.   . Carboxymethylcellulose Sodium (THERATEARS OP) Apply to eye.   . Cholecalciferol (VITAMIN D3) 5000 units TABS Take 5,000  Units by mouth daily.   Marland Kitchen dicyclomine (BENTYL) 10 MG capsule Take 10 mg by mouth 4 (four) times daily -  before meals and at bedtime.   . docusate sodium (COLACE) 50 MG capsule Take 50 mg by mouth daily as needed for mild constipation. Mon, Wed, Fri   . escitalopram (LEXAPRO) 20 MG tablet Take 1 tablet (20 mg total) by mouth daily.   Marland Kitchen estradiol (ESTRACE) 0.1 MG/GM vaginal cream Use once a week as needed   . fluticasone (FLONASE) 50 MCG/ACT nasal spray 1 SPRAY IN EACH NOSTRIL ONCE ADAY AS NEEDED   . ibuprofen (ADVIL,MOTRIN) 200 MG tablet Take 200 mg by mouth every 6 (six) hours as needed.   Marland Kitchen Lifitegrast (XIIDRA) 5 % SOLN Apply to eye.   . Liniments (SALONPAS PAIN RELIEF PATCH EX) Apply 1 patch topically 2 (two) times daily.   Marland Kitchen lubiprostone (AMITIZA) 8 MCG capsule Take 1 capsule po bid prn ibs constipation.   . Multiple Vitamin (MULTI-VITAMIN) tablet Take by mouth.   . Multiple Vitamins-Minerals (CENTRUM SILVER 50+WOMEN) TABS Take by mouth.   . oxybutynin (DITROPAN-XL) 10 MG 24 hr tablet Take 1 tablet (10 mg total) by mouth daily.   . pantoprazole (PROTONIX) 40 MG tablet Take 1 tablet (40 mg total) by mouth daily.   . promethazine (PHENERGAN) 25 MG tablet Take by mouth.   . RESTASIS 0.05 % ophthalmic emulsion    . rOPINIRole (REQUIP) 1 MG tablet TAKE ONE TABLET 3 TIMES DAILY AS NEEDED FOR RESTLESS LEGS   . simethicone (MYLICON) 80 MG chewable tablet Chew by mouth.   Marland Kitchen  trolamine salicylate (ASPERCREME) 10 % cream Apply topically. 04/08/2016: Received from: [place]: Apply topically as needed.   No facility-administered encounter medications on file as of 03/21/2019.     Surgical History: Past Surgical History:  Procedure Laterality Date  . COLONOSCOPY  05-03-15  . COLONOSCOPY WITH PROPOFOL N/A 05/03/2015   Procedure: COLONOSCOPY WITH PROPOFOL;  Surgeon: Lucilla Lame, MD;  Location: [facility];  Service: Endoscopy;  Laterality: N/A;  CPAP  . DILATION  AND CURETTAGE OF UTERUS  1970  . ENDOMETRIAL BIOPSY    . EYE SURGERY Right 2017  . POLYPECTOMY  05/03/2015   Procedure: POLYPECTOMY;  Surgeon: Lucilla Lame, MD;  Location: [facility];  Service: Endoscopy;;  . ROTATOR CUFF REPAIR  2007  . SPINE SURGERY      Medical History: Past Medical History:  Diagnosis Date  . Arthritis   . Atrophic vaginitis 03/06/2013   Last Assessment & Plan:  She will plan to continue estradiol vaginal cream.  Prescription was rewritten stipulating use of the generic product.   . Atypical migraine 03/06/2013   Last Assessment & Plan:  Since she rarely takes sumatriptan, we will discontinue it. Continue sparing use of OTC migraine products.   . Avitaminosis D 03/06/2013   Last Assessment & Plan:  Recheck vitamin D level. Plan to continue vitamin D supplementation.   . Ceratitis 08/01/2013   Last Assessment & Plan:  Because of the expense, she will discontinue Restasis. She will use over-the-counter moisturizing eyedrops and liquid gel.   . Colon polyp   . 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   . 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   . Cystocele, midline 03/06/2013  . Demoralization and apathy 03/06/2013   Last Assessment & Plan:  Since she has been taking bupropion only once a day, I will write the prescription with the correct instructions and correct quantity. She has done well on this for years and she is encouraged to take it regularly   . Depression   . Environmental allergies   . Epistaxis   . Essential (primary) hypertension 05/17/2013   Last Assessment & Plan:  Her blood pressure is well-controlled. We will plan to continue hydrochlorothiazide and benazepril.  Both are generic and should be affordable on her health plan.   Marland Kitchen GERD (gastroesophageal reflux disease)    . Hemorrhoid   . 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 We will continue that regimen.   . Sinusitis   . Sleep apnea    CPAP    Family History: Family History  Problem Relation Age of Onset  . Arthritis Mother   . Alzheimer's disease Father   . Heart disease Sister   . Cancer Brother   . Ovarian cancer Neg Hx   . Breast cancer Neg Hx   . Colon cancer Neg Hx   . Diabetes Neg Hx     Social History: Social History   Socioeconomic History  . Marital status: Widowed    Spouse name: Not on file  . Number of children: 2  . Years of education: Not on file  . Highest education level: Some college, no degree  Occupational  History  . Not on file  Social Needs  . Financial resource strain: Hard  . Food insecurity    Worry: Often true    Inability: Often true  . Transportation needs    Medical: No    Non-medical: No  Tobacco Use  . Smoking status: Never Smoker  . Smokeless tobacco: Never Used  Substance and Sexual Activity  . Alcohol use: No    Alcohol/week: 0.0 standard drinks  . Drug use: No  . Sexual activity: Not Currently  Lifestyle  . Physical activity    Days per week: 0 days    Minutes per session: 0 min  . Stress: To some extent  Relationships  . Social Herbalist on phone: Not on file    Gets together: Not on file    Attends religious service: More than 4 times per year    Active member of club or organization: No    Attends meetings of clubs or organizations: Never    Relationship status: Widowed  . Intimate partner violence    Fear of current or ex partner: No    Emotionally abused: No    Physically abused: No    Forced sexual activity: No  Other Topics Concern  . Not on file  Social History Narrative  . Not on file    Vital Signs: Blood pressure 134/70, pulse 95, resp. rate 16,  height 5\' 5"  (1.651 m), weight 156 lb (70.8 kg), SpO2 98 %.  Examination: General Appearance: The patient is well-developed, well-nourished, and in no distress. Skin: Gross inspection of skin unremarkable. Head: normocephalic, no gross deformities. Eyes: no gross deformities noted. ENT: ears appear grossly normal no exudates. Neck: Supple. No thyromegaly. No LAD. Respiratory: no rhonchi  Noted at this time. Cardiovascular: Normal S1 and S2 without murmur or rub. Extremities: No cyanosis. pulses are equal. Neurologic: Alert and oriented. No involuntary movements.  LABS: No results found for this or any previous visit (from the past 2160 hour(s)).  Radiology: Dg Lumbar Spine Complete  Result Date: 08/11/2018 CLINICAL DATA:  Fall from ladder several years ago with persistent low back pain, initial encounter EXAM: LUMBAR SPINE - COMPLETE 4+ VIEW COMPARISON:  01/08/2016 FINDINGS: Postsurgical changes are again noted at L4-5. Vertebral body height is well maintained. No pars defects are noted. Diffuse aortic calcifications are seen. Mild osteophytic changes are noted as well. IMPRESSION: Stable postsurgical changes at L4-5. Mild degenerative change without acute abnormality. Electronically Signed   By: Inez Catalina M.D.   On: 08/11/2018 14:17    No results found.  No results found.    Assessment and Plan: Patient Active Problem List   Diagnosis Date Noted  . Delusional disorder ([place]) 03/08/2019  . MDD (major depressive disorder), recurrent episode, mild ([place]) 01/18/2019  . 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 History of psychosis 01/18/2019  . Gastroesophageal reflux disease without esophagitis 11/27/2018  . Irritable bowel syndrome with constipation 11/27/2018  . Chronic bilateral low back pain without sciatica 08/17/2018  . Vitamin B12 deficiency 08/17/2018  . Elevated TSH 08/17/2018  . UTI (urinary tract infection), uncomplicated [id number]  . Generalized anxiety  disorder 08/16/2017  . Primary generalized (osteo)arthritis 08/16/2017  . Melena 12/05/2015  . Blood in stool   . Benign neoplasm of transverse colon   . Benign neoplasm of cecum   . Ceratitis 08/01/2013  . Keratitis sicca 08/01/2013  . Essential (primary) hypertension 05/17/2013  . Cystocele, midline 03/06/2013  . Demoralization and  apathy 03/06/2013  . Atypical migraine 03/06/2013  . Hyperlipidemia 03/06/2013  . Jittery 03/06/2013  . 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 Atrophic vaginitis 03/06/2013  . 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 Avitaminosis D 03/06/2013  . Allergic rhinitis due to pollen 03/06/2013  . Body mass index between 19-24, adult 03/06/2013  . Breast screening 03/06/2013  . 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 Screening for depression 03/06/2013    1. OSA generally she has been doing well with good compliance of the CPAP device.  She now has this issue with the mask I have suggested to her that she try using the nasal pillows and discussed with her primary DME provider regarding the findings of the mask.  She will try this and also I did give her a prescription for nasal pillows. 2. GERD this is under control we will continue with present management. 3. Skin rash for the skin rash I will give her some hydrocortisone cream to use and hopefully this will take care of the minor rash that she is got if it does not improve we will need to have her seen by dermatology.  General Counseling: I have discussed the findings of the evaluation and examination with Kristen Pennington.  I have also discussed any further diagnostic evaluation thatmay be needed or ordered today. Kristen Pennington verbalizes understanding of the findings of todays visit. We also reviewed her medications today and discussed drug interactions and side effects including but not limited excessive drowsiness and altered mental states. We also discussed that  there is always a risk not just to her but also people around her. she has been encouraged to call the office with any questions or concerns that should arise related to todays visit.    Time spent: 15  I have personally obtained a history, examined the patient, evaluated laboratory and imaging results, formulated the assessment and plan and placed orders.    Allyne Gee, MD [facility] Pulmonary and Critical Care Sleep medicine

## 2019-03-21 NOTE — Patient Instructions (Signed)

## 2019-03-22 ENCOUNTER — Telehealth: Payer: Self-pay

## 2019-03-22 NOTE — Telephone Encounter (Signed)
Gave orders RX for [facility] for cpap supplies. Beth

## 2019-03-23 ENCOUNTER — Telehealth: Payer: Self-pay

## 2019-03-23 NOTE — Telephone Encounter (Signed)
Gave [facility] orders RX for cpap supplies and put copy in scan. Beth

## 2019-03-31 ENCOUNTER — Other Ambulatory Visit: Payer: Self-pay

## 2019-03-31 MED ORDER — ROPINIROLE HCL 1 MG PO TABS: ORAL_TABLET | ORAL | 0 refills | Status: DC

## 2019-04-03 ENCOUNTER — Other Ambulatory Visit: Payer: Self-pay | Admitting: Psychiatry

## 2019-04-03 DIAGNOSIS — F411 Generalized anxiety disorder: Secondary | ICD-10-CM

## 2019-04-03 DIAGNOSIS — F330 Major depressive disorder, recurrent, mild: Secondary | ICD-10-CM

## 2019-04-05 ENCOUNTER — Other Ambulatory Visit: Payer: Self-pay | Admitting: Psychiatry

## 2019-04-05 DIAGNOSIS — F411 Generalized anxiety disorder: Secondary | ICD-10-CM

## 2019-04-05 DIAGNOSIS — F330 Major depressive disorder, recurrent, mild: Secondary | ICD-10-CM

## 2019-04-05 MED ORDER — ESCITALOPRAM OXALATE 20 MG PO TABS: 20.0000 mg | ORAL_TABLET | Freq: Every day | ORAL | 0 refills | Status: DC

## 2019-04-05 MED ORDER — ARIPIPRAZOLE 2 MG PO TABS: 1.0000 mg | ORAL_TABLET | Freq: Every day | ORAL | 0 refills | Status: DC

## 2019-04-05 NOTE — Telephone Encounter (Signed)
Sent lexapro, abilify to pharmacy

## 2019-04-12 ENCOUNTER — Other Ambulatory Visit: Payer: Self-pay

## 2019-04-12 ENCOUNTER — Ambulatory Visit (INDEPENDENT_AMBULATORY_CARE_PROVIDER_SITE_OTHER): Payer: Medicare HMO

## 2019-04-12 DIAGNOSIS — G4733 Obstructive sleep apnea (adult) (pediatric): Secondary | ICD-10-CM | POA: Diagnosis not present

## 2019-04-12 NOTE — Progress Notes (Signed)
Pt received new nasal pillow mask

## 2019-04-17 ENCOUNTER — Telehealth: Payer: Self-pay

## 2019-04-17 NOTE — Telephone Encounter (Signed)
[facility] ORDER SIGNED AND PLACED IN [facility] FOLDER.

## 2019-04-21 ENCOUNTER — Ambulatory Visit: Payer: Self-pay | Admitting: Psychiatry

## 2019-04-21 ENCOUNTER — Other Ambulatory Visit: Payer: Self-pay

## 2019-04-21 DIAGNOSIS — Z5329 Procedure and treatment not carried out because of patient's decision for other reasons: Secondary | ICD-10-CM

## 2019-04-21 NOTE — Progress Notes (Signed)
No response to call 

## 2019-04-25 DIAGNOSIS — H353131 Nonexudative age-related macular degeneration, bilateral, early dry stage: Secondary | ICD-10-CM | POA: Diagnosis not present

## 2019-04-26 ENCOUNTER — Encounter: Payer: Self-pay | Admitting: Psychiatry

## 2019-04-26 ENCOUNTER — Ambulatory Visit (INDEPENDENT_AMBULATORY_CARE_PROVIDER_SITE_OTHER): Payer: Medicare HMO | Admitting: Psychiatry

## 2019-04-26 ENCOUNTER — Other Ambulatory Visit: Payer: Self-pay

## 2019-04-26 DIAGNOSIS — F411 Generalized anxiety disorder: Secondary | ICD-10-CM | POA: Diagnosis not present

## 2019-04-26 DIAGNOSIS — F330 Major depressive disorder, recurrent, mild: Secondary | ICD-10-CM

## 2019-04-26 DIAGNOSIS — F22 Delusional disorders: Secondary | ICD-10-CM

## 2019-04-26 DIAGNOSIS — F5105 Insomnia due to other mental disorder: Secondary | ICD-10-CM

## 2019-04-26 DIAGNOSIS — R69 Illness, unspecified: Secondary | ICD-10-CM | POA: Diagnosis not present

## 2019-04-26 MED ORDER — MIRTAZAPINE 15 MG PO TABS: 7.5000 mg | ORAL_TABLET | Freq: Every day | ORAL | 3 refills | Status: DC

## 2019-04-26 NOTE — Progress Notes (Signed)
Virtual Visit via Telephone Note  I connected with Kristen Pennington on 04/26/19 at 10:45 AM EDT by telephone and verified that I am speaking with the correct person using two identifiers.   I discussed the limitations, risks, security and privacy concerns of performing an evaluation and management service by telephone and the availability of in person appointments. I also discussed with the patient that there may be a patient responsible charge related to this service. The patient expressed understanding and agreed to proceed. I discussed the assessment and treatment plan with the patient. The patient was provided an opportunity to ask questions and all were answered. The patient agreed with the plan and demonstrated an understanding of the instructions.   The patient was advised to call back or seek an in-person evaluation if the symptoms worsen or if the condition fails to improve as anticipated.   [place] MD OP Progress Note  04/26/2019 5:33 PM SALAMA STUVER  MRN:  [id number]  Chief Complaint:  Chief Complaint    Follow-up     HPI: Kristen Pennington is an 85 year old Caucasian female, widowed, lives in [place], has a history of GAD, MDD, delusional disorder, insomnia, hearing loss, vision loss, OSA, hypertension was evaluated by phone today.  Patient preferred to do a phone call.  Patient reports that she is currently struggling with some joint pain.  She usually has it with any change in seasons.  She reports she has been taking over-the-counter pain medication which does not help much.  She hence is struggling with her sleep.  She also reports she is having some trouble with her CPAP machine.  She however was finally able to get someone from the company who is coming to fix it for her today.  She is hopeful that once she gets the CPAP machine fixed she may be able to sleep better.  Patient reports she has noticed that the mirtazapine 7.5 mg does help her with her sleep.  She had wanted to come off  of it last visit.  She however reports she restarted taking it and that may be beneficial.  She wants to stay on it.  She is compliant on Lexapro and Abilify low dosage.  She denies side effects.  Patient reports she continues to stay busy trying to help other residents.  Patient denies any suicidality, homicidality or perceptual disturbances.  Patient appeared to be alert, oriented to person place and situation and was able to answer questions appropriately.  Patient did not appear to be too preoccupied with any delusions or paranoid thoughts today. Visit Diagnosis:    ICD-10-CM   1. Generalized anxiety disorder  F41.1 mirtazapine (REMERON) 15 MG tablet   stable  2. MDD (major depressive disorder), recurrent episode, mild (HCC)  F33.0 mirtazapine (REMERON) 15 MG tablet   stable  3. Delusional disorder ([place])  F22    stable  4. Insomnia due to mental condition  F51.05 mirtazapine (REMERON) 15 MG tablet    Past Psychiatric History: Reviewed past psychiatric history from my progress note on 07/25/2018.  Past trials of Klonopin, Lexapro, Abilify, mirtazapine  Past Medical History:  Past Medical History:  Diagnosis Date  . Arthritis   . Atrophic vaginitis 03/06/2013   Last Assessment & Plan:  She will plan to continue estradiol vaginal cream.  Prescription was rewritten stipulating use of the generic product.   . Atypical migraine 03/06/2013   Last Assessment & Plan:  Since she rarely takes sumatriptan, we will discontinue it. Continue sparing use  of OTC migraine products.   . Avitaminosis D 03/06/2013   Last Assessment & Plan:  Recheck vitamin D level. Plan to continue vitamin D supplementation.   . Ceratitis 08/01/2013   Last Assessment & Plan:  Because of the expense, she will discontinue Restasis. She will use over-the-counter moisturizing eyedrops and liquid gel.   . Colon polyp   . Combined fat and carbohydrate induced hyperlipemia 03/06/2013   Last Assessment & Plan:  Recheck  fasting lipids.   . 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   . Cystocele, midline 03/06/2013  . Demoralization and apathy 03/06/2013   Last Assessment & Plan:  Since she has been taking bupropion only once a day, I will write the prescription with the correct instructions and correct quantity. She has done well on this for years and she is encouraged to take it regularly   . Depression   . Environmental allergies   . Epistaxis   . Essential (primary) hypertension 05/17/2013   Last Assessment & Plan:  Her blood pressure is well-controlled. We will plan to continue hydrochlorothiazide and benazepril.  Both are generic and should be affordable on her health plan.   Marland Kitchen GERD (gastroesophageal reflux disease)   . Hemorrhoid   . Inflammation of sacroiliac joint ([place]) 03/06/2013   Last Assessment & Plan:  She is doing well on her present regimen of fentanyl patch supplemented with sparing use of tramadol/acetaminophen. We will continue that regimen.   . Sinusitis   . Sleep apnea    CPAP    Past Surgical History:  Procedure Laterality Date  . COLONOSCOPY  05-03-15  . COLONOSCOPY WITH PROPOFOL N/A 05/03/2015   Procedure: COLONOSCOPY WITH PROPOFOL;  Surgeon: Lucilla Lame, MD;  Location: [facility];  Service: Endoscopy;  Laterality: N/A;  CPAP  . DILATION AND CURETTAGE OF UTERUS  1970  . ENDOMETRIAL BIOPSY    . EYE SURGERY Right 2017  . POLYPECTOMY  05/03/2015   Procedure: POLYPECTOMY;  Surgeon: Lucilla Lame, MD;  Location: [facility];  Service: Endoscopy;;  . ROTATOR CUFF REPAIR  2007  . SPINE SURGERY      Family Psychiatric History: I have reviewed family psychiatric history from my progress note on 07/25/2018  Family History:  Family History  Problem Relation Age of Onset  . Arthritis Mother   . Alzheimer's disease Father   . Heart disease Sister   . Cancer  Brother   . Ovarian cancer Neg Hx   . Breast cancer Neg Hx   . Colon cancer Neg Hx   . Diabetes Neg Hx     Social History: Reviewed social history from my progress note on 07/25/2018 Social History   Socioeconomic History  . Marital status: Widowed    Spouse name: Not on file  . Number of children: 2  . Years of education: Not on file  . Highest education level: Some college, no degree  Occupational History  . Not on file  Social Needs  . Financial resource strain: Hard  . Food insecurity    Worry: Often true    Inability: Often true  . Transportation needs    Medical: No    Non-medical: No  Tobacco Use  . Smoking status: Never Smoker  . Smokeless tobacco: Never Used  Substance and Sexual Activity  . Alcohol use: No    Alcohol/week: 0.0 standard drinks  .  Drug use: No  . Sexual activity: Not Currently  Lifestyle  . Physical activity    Days per week: 0 days    Minutes per session: 0 min  . Stress: To some extent  Relationships  . Social Herbalist on phone: Not on file    Gets together: Not on file    Attends religious service: More than 4 times per year    Active member of club or organization: No    Attends meetings of clubs or organizations: Never    Relationship status: Widowed  Other Topics Concern  . Not on file  Social History Narrative  . Not on file    Allergies:  Allergies  Allergen Reactions  . Linaclotide Swelling    Metabolic Disorder Labs: Lab Results  Component Value Date   HGBA1C 5.4 08/01/2018   Lab Results  Component Value Date   PROLACTIN 8.1 08/01/2018   Lab Results  Component Value Date   CHOL 245 (H) 08/01/2018   TRIG 187 (H) 08/01/2018   HDL 49 08/01/2018   LDLCALC 159 (H) 08/01/2018   Lab Results  Component Value Date   TSH 2.830 08/10/2018   TSH 5.550 (H) 08/01/2018    Therapeutic Level Labs: No results found for: LITHIUM No results found for: VALPROATE No components found for:  CBMZ  Current  Medications: Current Outpatient Medications  Medication Sig Dispense Refill  . acetaminophen (TYLENOL) 500 MG tablet Take 500 mg by mouth every 6 (six) hours as needed.    . ARIPiprazole (ABILIFY) 2 MG tablet Take 0.5 tablets (1 mg total) by mouth daily. 45 tablet 0  . bisoprolol-hydrochlorothiazide (ZIAC) 2.5-6.25 MG tablet TAKE ONE TABLET BY MOUTH EVERY DAY FOR BLOOD PRESSURE (HIGH) 30 tablet 5  . Calcium Carb-Cholecalciferol 600-800 MG-UNIT TABS Take by mouth.    . Carboxymethylcellulose Sodium (THERATEARS OP) Apply to eye.    . Cholecalciferol (VITAMIN D3) 5000 units TABS Take 5,000 Units by mouth daily.    Marland Kitchen dicyclomine (BENTYL) 10 MG capsule Take 10 mg by mouth 4 (four) times daily -  before meals and at bedtime.    . docusate sodium (COLACE) 50 MG capsule Take 50 mg by mouth daily as needed for mild constipation. Mon, Wed, Fri    . escitalopram (LEXAPRO) 20 MG tablet Take 1 tablet (20 mg total) by mouth daily. 90 tablet 0  . estradiol (ESTRACE) 0.1 MG/GM vaginal cream Use once a week as needed 42.5 g 1  . fluticasone (FLONASE) 50 MCG/ACT nasal spray 1 SPRAY IN EACH NOSTRIL ONCE ADAY AS NEEDED 16 g 1  . hydrocortisone cream 0.5 % Apply 1 application topically 2 (two) times daily. 30 g 0  . ibuprofen (ADVIL,MOTRIN) 200 MG tablet Take 200 mg by mouth every 6 (six) hours as needed.    Marland Kitchen Lifitegrast (XIIDRA) 5 % SOLN Apply to eye.    . Liniments (SALONPAS PAIN RELIEF PATCH EX) Apply 1 patch topically 2 (two) times daily.    Marland Kitchen lubiprostone (AMITIZA) 8 MCG capsule Take 1 capsule po bid prn ibs constipation. 60 capsule 3  . mirtazapine (REMERON) 15 MG tablet Take 0.5 tablets (7.5 mg total) by mouth at bedtime. For sleep 15 tablet 3  . Multiple Vitamin (MULTI-VITAMIN) tablet Take by mouth.    . Multiple Vitamins-Minerals (CENTRUM SILVER 50+WOMEN) TABS Take by mouth.    . oxybutynin (DITROPAN-XL) 10 MG 24 hr tablet Take 1 tablet (10 mg total) by mouth daily. 30 tablet 3  .  pantoprazole (PROTONIX)  40 MG tablet Take 1 tablet (40 mg total) by mouth daily. 90 tablet 1  . promethazine (PHENERGAN) 25 MG tablet Take by mouth.    . RESTASIS 0.05 % ophthalmic emulsion     . rOPINIRole (REQUIP) 1 MG tablet TAKE ONE TABLET 3 TIMES DAILY AS NEEDED FOR RESTLESS LEGS 90 tablet 0  . simethicone (MYLICON) 80 MG chewable tablet Chew by mouth.    . trolamine salicylate (ASPERCREME) 10 % cream Apply topically.     No current facility-administered medications for this visit.      Musculoskeletal: Strength & Muscle Tone: UTA Gait & Station: Reports as WNL Patient leans: N/A  Psychiatric Specialty Exam: Review of Systems  Musculoskeletal: Positive for joint pain.  Psychiatric/Behavioral: Positive for depression. The patient has insomnia.   All other systems reviewed and are negative.   There were no vitals taken for this visit.There is no height or weight on file to calculate BMI.  General Appearance: UTA  Eye Contact:  UTA  Speech:  Clear and Coherent  Volume:  Normal  Mood:  Depressed improving  Affect:  UTA  Thought Process:  Goal Directed and Descriptions of Associations: Intact  Orientation:  Full (Time, Place, and Person)  Thought Content: Logical   Suicidal Thoughts:  No  Homicidal Thoughts:  No  Memory:  Immediate;   Fair Recent;   Fair Remote;   Fair  Judgement:  Fair  Insight:  Fair  Psychomotor Activity:  UTA  Concentration:  Concentration: Fair and Attention Span: Fair  Recall:  [employer] of Knowledge: Fair  Language: Fair  Akathisia:  No  Handed:  Right  AIMS (if indicated): denies tremors, rigidity  Assets:  Communication Skills Desire for Improvement Housing Social Support  ADL's:  Intact  Cognition: WNL  Sleep:  Restless   Screenings: Mini-Mental     Clinical Support from 03/06/2019 in [facility], [place] from 03/02/2018 in [facility], [facility]  Total Score (max 30 points )  30  27    PHQ2-9     Clinical Support from  03/06/2019 in [facility], [facility] Office Visit from 11/08/2018 in [facility], [facility] Office Visit from 08/08/2018 in [facility], [facility] Office Visit from 06/30/2018 in [facility], [facility] Office Visit from 05/02/2018 in [facility], [facility]  PHQ-2 Total Score  0  0  1  0  0       Assessment and Plan: Kristen Pennington is an 85 year old female, widowed, has a history of GAD, MDD, history of psychosis, essential hypertension, chronic pain, OSA, vision loss, hearing loss was evaluated by phone today.  Patient continues to have psychosocial stressors of the current pandemic as well as her own health problems.  Patient however is currently making progress.  Plan GAD-improving Lexapro 20 mg p.o. daily Abilify at reduced dosage of 1 mg p.o. daily.   MDD-improving Lexapro as prescribed Abilify 1 mg p.o. daily  Delusional disorder-improving Abilify 1 mg p.o. daily.  Insomnia-improving Restart mirtazapine 7.5 mg p.o. nightly.  She reports she has been using it and that has been helpful.  Follow-up in clinic in 2 months or sooner if needed.  December 9 at 11 AM  I have spent atleast 15 minutes non face to face with patient today. More than 50 % of the time was spent for psychoeducation and supportive psychotherapy and care coordination. This note was generated in part or whole with voice recognition software. Voice  recognition is usually quite accurate but there are transcription errors that can and very often do occur. I apologize for any typographical errors that were not detected and corrected.      Ursula Alert, MD 04/26/2019, 5:33 PM

## 2019-05-01 ENCOUNTER — Encounter: Payer: Self-pay | Admitting: Nurse Practitioner

## 2019-05-01 ENCOUNTER — Telehealth: Payer: Self-pay

## 2019-05-01 ENCOUNTER — Other Ambulatory Visit: Payer: Self-pay

## 2019-05-01 ENCOUNTER — Ambulatory Visit (INDEPENDENT_AMBULATORY_CARE_PROVIDER_SITE_OTHER): Payer: Medicare HMO | Admitting: Nurse Practitioner

## 2019-05-01 VITALS — BP 161/73 | HR 72 | Temp 96.5°F | Resp 16 | Ht 65.0 in | Wt 158.0 lb

## 2019-05-01 DIAGNOSIS — G8929 Other chronic pain: Secondary | ICD-10-CM | POA: Diagnosis not present

## 2019-05-01 DIAGNOSIS — M545 Low back pain, unspecified: Secondary | ICD-10-CM

## 2019-05-01 DIAGNOSIS — I10 Essential (primary) hypertension: Secondary | ICD-10-CM | POA: Diagnosis not present

## 2019-05-01 DIAGNOSIS — F411 Generalized anxiety disorder: Secondary | ICD-10-CM | POA: Diagnosis not present

## 2019-05-01 DIAGNOSIS — R69 Illness, unspecified: Secondary | ICD-10-CM | POA: Diagnosis not present

## 2019-05-01 MED ORDER — MELOXICAM 7.5 MG PO TABS: 7.5000 mg | ORAL_TABLET | Freq: Every day | ORAL | 0 refills | Status: DC

## 2019-05-01 NOTE — Telephone Encounter (Signed)
Gave [facility] orders for new cpap,pt got new machine in 2015 and she is due, she had a repeat cpap titration in 2017 and will use that data for new set up. Kristen Pennington

## 2019-05-01 NOTE — Progress Notes (Signed)
[facility], [place], [place] [address]  Internal MEDICINE  Office Visit Note  Patient Name: Kristen Pennington  [id number]  [id number]  Date of Service: 05/01/2019   Pt is here for a sick visit.   Chief Complaint  Patient presents with  . Arthritis    otc ibuprofen is not helping, unbearable pain     The patient is here for acute visit. Today, she is complaining of lower back pain. States that this gets worse every year when the weather changes. She did have surgery on her lower back in 2005 or 2006. States that pain runs down her legs and into her feet. She has been taking tylenol to help the pain when she has it. States that this is not helping her pain. She states that her pain is severe. Yesterday and today, she states that she has been nearly pain free. She states that she lives alone and has to do everything herself in her home.       Current Medication:  Outpatient Encounter Medications as of 05/01/2019  Medication Sig Note  . acetaminophen (TYLENOL) 500 MG tablet Take 500 mg by mouth every 6 (six) hours as needed.   . ARIPiprazole (ABILIFY) 2 MG tablet Take 0.5 tablets (1 mg total) by mouth daily.   . bisoprolol-hydrochlorothiazide (ZIAC) 2.5-6.25 MG tablet TAKE ONE TABLET BY MOUTH EVERY DAY FOR BLOOD PRESSURE (HIGH)   . Calcium Carb-Cholecalciferol 600-800 MG-UNIT TABS Take by mouth.   . Carboxymethylcellulose Sodium (THERATEARS OP) Apply to eye.   . Cholecalciferol (VITAMIN D3) 5000 units TABS Take 5,000 Units by mouth daily.   Marland Kitchen dicyclomine (BENTYL) 10 MG capsule Take 10 mg by mouth 4 (four) times daily -  before meals and at bedtime.   . docusate sodium (COLACE) 50 MG capsule Take 50 mg by mouth daily as needed for mild constipation. Mon, Wed, Fri   . escitalopram (LEXAPRO) 20 MG tablet Take 1 tablet (20 mg total) by mouth daily.   Marland Kitchen estradiol (ESTRACE) 0.1 MG/GM vaginal cream Use once a week as needed   . fluticasone (FLONASE) 50 MCG/ACT  nasal spray 1 SPRAY IN EACH NOSTRIL ONCE ADAY AS NEEDED   . hydrocortisone cream 0.5 % Apply 1 application topically 2 (two) times daily.   Marland Kitchen ibuprofen (ADVIL,MOTRIN) 200 MG tablet Take 200 mg by mouth every 6 (six) hours as needed.   Marland Kitchen Lifitegrast (XIIDRA) 5 % SOLN Apply to eye.   . Liniments (SALONPAS PAIN RELIEF PATCH EX) Apply 1 patch topically 2 (two) times daily.   Marland Kitchen lubiprostone (AMITIZA) 8 MCG capsule Take 1 capsule po bid prn ibs constipation.   . mirtazapine (REMERON) 15 MG tablet Take 0.5 tablets (7.5 mg total) by mouth at bedtime. For sleep   . Multiple Vitamin (MULTI-VITAMIN) tablet Take by mouth.   . Multiple Vitamins-Minerals (CENTRUM SILVER 50+WOMEN) TABS Take by mouth.   . oxybutynin (DITROPAN-XL) 10 MG 24 hr tablet Take 1 tablet (10 mg total) by mouth daily.   . pantoprazole (PROTONIX) 40 MG tablet Take 1 tablet (40 mg total) by mouth daily.   . promethazine (PHENERGAN) 25 MG tablet Take by mouth.   . RESTASIS 0.05 % ophthalmic emulsion    . rOPINIRole (REQUIP) 1 MG tablet TAKE ONE TABLET 3 TIMES DAILY AS NEEDED FOR RESTLESS LEGS   . simethicone (MYLICON) 80 MG chewable tablet Chew by mouth.   . trolamine salicylate (ASPERCREME) 10 % cream Apply topically. 04/08/2016: Received from: [facility] Received Sig: Apply topically as needed.  . meloxicam (MOBIC) 7.5 MG tablet Take 1 tablet (7.5 mg total) by mouth daily.    No facility-administered encounter medications on file as of 05/01/2019.       Medical History: Past Medical History:  Diagnosis Date  . Arthritis   . Atrophic vaginitis 03/06/2013   Last Assessment & Plan:  She will plan to continue estradiol vaginal cream.  Prescription was rewritten stipulating use of the generic product.   . Atypical migraine 03/06/2013   Last Assessment & Plan:  Since she rarely takes sumatriptan, we will discontinue it. Continue sparing use of OTC migraine products.   . Avitaminosis D 03/06/2013   Last Assessment &  Plan:  Recheck vitamin D level. Plan to continue vitamin D supplementation.   . Ceratitis 08/01/2013   Last Assessment & Plan:  Because of the expense, she will discontinue Restasis. She will use over-the-counter moisturizing eyedrops and liquid gel.   . Colon polyp   . 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   . 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   . Cystocele, midline 03/06/2013  . Demoralization and apathy 03/06/2013   Last Assessment & Plan:  Since she has been taking bupropion only once a day, I will write the prescription with the correct instructions and correct quantity. She has done well on this for years and she is encouraged to take it regularly   . Depression   . Environmental allergies   . Epistaxis   . Essential (primary) hypertension 05/17/2013   Last Assessment & Plan:  Her blood pressure is well-controlled. We will plan to continue hydrochlorothiazide and benazepril.  Both are generic and should be affordable on her health plan.   Marland Kitchen GERD (gastroesophageal reflux disease)   . Hemorrhoid   . Inflammation of sacroiliac joint ([place]) 03/06/2013   Last Assessment & Plan:  She is doing well on her present regimen of fentanyl patch supplemented with sparing use of tramadol/acetaminophen. We will continue that regimen.   . Sinusitis   . Sleep apnea    CPAP     Today's Vitals   05/01/19 0935  BP: (!) 161/73  Pulse: 72  Resp: 16  Temp: (!) 96.5 F (35.8 C)  SpO2: 97%  Weight: 158 lb (71.7 kg)  Height: 5\' 5"  (1.651 m)   Body mass index is 26.29 kg/m.  Review of Systems  Constitutional: Positive for fatigue. Negative for activity change, chills and unexpected weight change.  HENT: Negative for congestion, postnasal drip, rhinorrhea, sneezing and sore throat.   Respiratory: Negative for  cough, chest tightness, shortness of breath and wheezing.   Cardiovascular: Negative for chest pain and palpitations.  Gastrointestinal: Negative for abdominal pain, constipation, diarrhea, nausea and vomiting.  Endocrine: Negative for cold intolerance, heat intolerance, polydipsia and polyuria.  Musculoskeletal: Positive for back pain and myalgias. Negative for arthralgias, joint swelling and neck pain.       Intermittent low back pain which can be severe. Started back in September when weather changes to cooler temperatures. Yesterday and today, she has been pain free.   Skin: Negative for rash.  Neurological: Positive for weakness and headaches. Negative for tremors and numbness.  Hematological: Negative for adenopathy. Does not bruise/bleed easily.  Psychiatric/Behavioral: Negative for behavioral problems (Depression), sleep disturbance and  suicidal ideas. The patient is nervous/anxious.     Physical Exam Vitals signs and nursing note reviewed.  Constitutional:      General: She is not in acute distress.    Appearance: Normal appearance. She is well-developed. She is not diaphoretic.  HENT:     Head: Normocephalic and atraumatic.     Mouth/Throat:     Pharynx: No oropharyngeal exudate.  Eyes:     Pupils: Pupils are equal, round, and reactive to light.  Neck:     Musculoskeletal: Normal range of motion and neck supple.     Thyroid: No thyromegaly.     Vascular: No JVD.     Trachea: No tracheal deviation.  Cardiovascular:     Rate and Rhythm: Normal rate and regular rhythm.     Heart sounds: Normal heart sounds. No murmur. No friction rub. No gallop.   Pulmonary:     Effort: Pulmonary effort is normal. No respiratory distress.     Breath sounds: Normal breath sounds. No wheezing or rales.  Chest:     Chest wall: No tenderness.  Abdominal:     Palpations: Abdomen is soft.     Tenderness: There is no abdominal tenderness. There is no guarding.  Musculoskeletal: Normal range of  motion.     Comments: She is using a walking cane to help with mobility.   Lymphadenopathy:     Cervical: No cervical adenopathy.  Skin:    General: Skin is warm and dry.  Neurological:     Mental Status: She is alert and oriented to person, place, and time.     Cranial Nerves: No cranial nerve deficit.  Psychiatric:        Behavior: Behavior normal.        Thought Content: Thought content normal.        Judgment: Judgment normal.   Assessment/Plan: 1. Chronic bilateral low back pain without sciatica Add meloxicam 7.5mg  daily as needed for pain/inflammation. Advised her not to take OTC ibuprofen or naproxen while taking this medication. She may take additional tylenol as needed for breakthrough pain.  - meloxicam (MOBIC) 7.5 MG tablet; Take 1 tablet (7.5 mg total) by mouth daily.  Dispense: 30 tablet; Refill: 0  2. Essential (primary) hypertension Generally stable. Continue bp medication as prescribed   3. Generalized anxiety disorder Continue regular visits with psychiatry as scheduled.   General Counseling: celissa ternes understanding of the findings of todays visit and agrees with plan of treatment. I have discussed any further diagnostic evaluation that may be needed or ordered today. We also reviewed her medications today. she has been encouraged to call the office with any questions or concerns that should arise related to todays visit.    Counseling:  This patient was seen by [place] with Dr Lavera Guise as a part of collaborative care agreement  Meds ordered this encounter  Medications  . meloxicam (MOBIC) 7.5 MG tablet    Sig: Take 1 tablet (7.5 mg total) by mouth daily.    Dispense:  30 tablet    Refill:  0    Please fill with generic alternative preferred by her insurance. Thanks.    Order Specific Question:   Supervising Provider    Answer:   Lavera Guise [id number]    Time spent: 15 Minutes

## 2019-05-02 ENCOUNTER — Other Ambulatory Visit: Payer: Self-pay

## 2019-05-02 MED ORDER — ROPINIROLE HCL 1 MG PO TABS: ORAL_TABLET | ORAL | 0 refills | Status: DC

## 2019-05-05 ENCOUNTER — Other Ambulatory Visit: Payer: Self-pay | Admitting: Nurse Practitioner

## 2019-05-05 ENCOUNTER — Telehealth: Payer: Self-pay | Admitting: Nurse Practitioner

## 2019-05-05 DIAGNOSIS — G8929 Other chronic pain: Secondary | ICD-10-CM

## 2019-05-05 DIAGNOSIS — M545 Low back pain, unspecified: Secondary | ICD-10-CM

## 2019-05-05 MED ORDER — TRAMADOL HCL 50 MG PO TABS: 50.0000 mg | ORAL_TABLET | Freq: Two times a day (BID) | ORAL | 0 refills | Status: DC | PRN

## 2019-05-05 NOTE — Telephone Encounter (Signed)
Please have her stop mobic. Add short term prescription for tramadol 50mg  twice daily. Prescription for #20 tablets was sent to her pharmacy.

## 2019-05-05 NOTE — Telephone Encounter (Signed)
Pt called and stated that mobic seems to be causing her severe anxiety , please advise ?

## 2019-05-05 NOTE — Progress Notes (Signed)
Meloxicam making patient feel anxious. D/c. Add short term prescription for tramadol 50mg  twice daily. Prescription for #20 tablets was sent to her pharmacy.

## 2019-05-16 ENCOUNTER — Telehealth: Payer: Self-pay

## 2019-05-16 NOTE — Telephone Encounter (Signed)
Patient needs to return call regarding cpap set up and supplies, she needs to return call for [facility] to discuss benefit pricing. [phone]  [facility]

## 2019-05-18 ENCOUNTER — Telehealth: Payer: Self-pay

## 2019-05-18 DIAGNOSIS — R69 Illness, unspecified: Secondary | ICD-10-CM | POA: Diagnosis not present

## 2019-05-18 NOTE — Telephone Encounter (Signed)
CONFIRMATION OF VERBAL ORDER SIGNED AND PLACED IN [employer] [facility] F[facility]DER. °

## 2019-05-18 NOTE — Telephone Encounter (Signed)
Patient has been advised to contact [facility] for cpap benefits and she will call me back to be placed on [facility] schedule for new set up. Beth

## 2019-06-05 ENCOUNTER — Other Ambulatory Visit: Payer: Self-pay

## 2019-06-05 ENCOUNTER — Telehealth: Payer: Self-pay

## 2019-06-05 DIAGNOSIS — I10 Essential (primary) hypertension: Secondary | ICD-10-CM

## 2019-06-05 DIAGNOSIS — K219 Gastro-esophageal reflux disease without esophagitis: Secondary | ICD-10-CM

## 2019-06-05 MED ORDER — PANTOPRAZOLE SODIUM 40 MG PO TBEC: 40.0000 mg | DELAYED_RELEASE_TABLET | Freq: Every day | ORAL | 1 refills | Status: DC

## 2019-06-05 MED ORDER — OXYBUTYNIN CHLORIDE ER 10 MG PO TB24: 10.0000 mg | ORAL_TABLET | Freq: Every day | ORAL | 3 refills | Status: DC

## 2019-06-05 MED ORDER — BISOPROLOL-HYDROCHLOROTHIAZIDE 2.5-6.25 MG PO TABS: ORAL_TABLET | ORAL | 5 refills | Status: DC

## 2019-06-05 MED ORDER — ROPINIROLE HCL 1 MG PO TABS: ORAL_TABLET | ORAL | 1 refills | Status: DC

## 2019-06-05 NOTE — Telephone Encounter (Signed)
Called lmom informing patient of appointment. klh 

## 2019-06-07 ENCOUNTER — Ambulatory Visit (INDEPENDENT_AMBULATORY_CARE_PROVIDER_SITE_OTHER): Payer: Medicare HMO

## 2019-06-07 ENCOUNTER — Other Ambulatory Visit: Payer: Self-pay

## 2019-06-07 DIAGNOSIS — G4733 Obstructive sleep apnea (adult) (pediatric): Secondary | ICD-10-CM

## 2019-06-07 NOTE — Progress Notes (Signed)
New cpap setup  Resmed auto cpap at 6 cmh2o with a heated humidifier, climate line and a resmed P-10 nasal mask in a small. She understood wearing, cleaning and following up with cpap.

## 2019-06-12 DIAGNOSIS — G4733 Obstructive sleep apnea (adult) (pediatric): Secondary | ICD-10-CM | POA: Diagnosis not present

## 2019-06-15 ENCOUNTER — Other Ambulatory Visit: Payer: Self-pay

## 2019-06-15 DIAGNOSIS — K219 Gastro-esophageal reflux disease without esophagitis: Secondary | ICD-10-CM | POA: Diagnosis not present

## 2019-06-15 DIAGNOSIS — R110 Nausea: Secondary | ICD-10-CM | POA: Diagnosis not present

## 2019-06-15 DIAGNOSIS — K581 Irritable bowel syndrome with constipation: Secondary | ICD-10-CM | POA: Diagnosis not present

## 2019-06-15 DIAGNOSIS — R140 Abdominal distension (gaseous): Secondary | ICD-10-CM | POA: Diagnosis not present

## 2019-06-15 MED ORDER — FLUTICASONE PROPIONATE 50 MCG/ACT NA SUSP: NASAL | 1 refills | Status: DC

## 2019-06-21 ENCOUNTER — Other Ambulatory Visit: Payer: Self-pay

## 2019-06-21 ENCOUNTER — Encounter: Payer: Self-pay | Admitting: Psychiatry

## 2019-06-21 ENCOUNTER — Ambulatory Visit (INDEPENDENT_AMBULATORY_CARE_PROVIDER_SITE_OTHER): Payer: Medicare HMO | Admitting: Psychiatry

## 2019-06-21 DIAGNOSIS — R69 Illness, unspecified: Secondary | ICD-10-CM | POA: Diagnosis not present

## 2019-06-21 DIAGNOSIS — F22 Delusional disorders: Secondary | ICD-10-CM | POA: Diagnosis not present

## 2019-06-21 DIAGNOSIS — F411 Generalized anxiety disorder: Secondary | ICD-10-CM

## 2019-06-21 DIAGNOSIS — F5105 Insomnia due to other mental disorder: Secondary | ICD-10-CM

## 2019-06-21 DIAGNOSIS — F3341 Major depressive disorder, recurrent, in partial remission: Secondary | ICD-10-CM

## 2019-06-21 NOTE — Progress Notes (Signed)
Virtual Visit via Telephone Note  I connected with Kristen Pennington on 06/21/19 at 11:00 AM EST by telephone and verified that I am speaking with the correct person using two identifiers.   I discussed the limitations, risks, security and privacy concerns of performing an evaluation and management service by telephone and the availability of in person appointments. I also discussed with the patient that there may be a patient responsible charge related to this service. The patient expressed understanding and agreed to proceed.     I discussed the assessment and treatment plan with the patient. The patient was provided an opportunity to ask questions and all were answered. The patient agreed with the plan and demonstrated an understanding of the instructions.   The patient was advised to call back or seek an in-person evaluation if the symptoms worsen or if the condition fails to improve as anticipated.   [place] MD OP Progress Note  06/21/2019 12:51 PM Kristen Pennington  MRN:  [id number]  Chief Complaint:  Chief Complaint    Follow-up     HPI: Kristen Pennington is an 85 year old Caucasian female, widowed, lives in [place], has a history of GAD, MDD, delusional disorder, insomnia, hearing loss, vision loss, OSA, hypertension was evaluated by phone today.  Patient preferred to do a phone call.  Patient today reports she continues to be in a lot of pain.  Her pain medications are currently not very helpful.  She reports she has the desire to do activities as well as do chores however she is unable to do so because of the pain.  It also affects her sleep at night.  The Remeron does help to some extent.  She reports she is not depressed and the medications is effective.  She however is worried about her health, the pain as well as the current pandemic.  Patient denies any suicidality, homicidality or perceptual disturbances.  Patient does not seem to be preoccupied with delusions today reports she does  not work on her computer and that does not distress her.  She continues to take Abilify which was initially started for her delusions.  Discussed discontinuing Abilify since she has made progress.   Patient denies any other concerns today. Visit Diagnosis:    ICD-10-CM   1. Generalized anxiety disorder  F41.1   2. MDD (major depressive disorder), recurrent, in partial remission ([place])  F33.41   3. Delusional disorder ([place])  F22    in remission  4. Insomnia due to mental condition  F51.05     Past Psychiatric History: I have reviewed past psychiatric history from my progress note on 07/25/2018.  Past trials of Klonopin, Lexapro, Abilify, mirtazapine.  Past Medical History:  Past Medical History:  Diagnosis Date  . Arthritis   . Atrophic vaginitis 03/06/2013   Last Assessment & Plan:  She will plan to continue estradiol vaginal cream.  Prescription was rewritten stipulating use of the generic product.   . Atypical migraine 03/06/2013   Last Assessment & Plan:  Since she rarely takes sumatriptan, we will discontinue it. Continue sparing use of OTC migraine products.   . Avitaminosis D 03/06/2013   Last Assessment & Plan:  Recheck vitamin D level. Plan to continue vitamin D supplementation.   . Ceratitis 08/01/2013   Last Assessment & Plan:  Because of the expense, she will discontinue Restasis. She will use over-the-counter moisturizing eyedrops and liquid gel.   . Colon polyp   . Combined fat and carbohydrate induced hyperlipemia 03/06/2013  Last Assessment & Plan:  Recheck fasting lipids.   . 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   . Cystocele, midline 03/06/2013  . Demoralization and apathy 03/06/2013   Last Assessment & Plan:  Since she has been taking bupropion only once a day, I will write the prescription with the correct instructions and correct quantity. She has done well  on this for years and she is encouraged to take it regularly   . Depression   . Environmental allergies   . Epistaxis   . Essential (primary) hypertension 05/17/2013   Last Assessment & Plan:  Her blood pressure is well-controlled. We will plan to continue hydrochlorothiazide and benazepril.  Both are generic and should be affordable on her health plan.   Marland Kitchen GERD (gastroesophageal reflux disease)   . Hemorrhoid   . Inflammation of sacroiliac joint ([place]) 03/06/2013   Last Assessment & Plan:  She is doing well on her present regimen of fentanyl patch supplemented with sparing use of tramadol/acetaminophen. We will continue that regimen.   . Sinusitis   . Sleep apnea    CPAP    Past Surgical History:  Procedure Laterality Date  . COLONOSCOPY  05-03-15  . COLONOSCOPY WITH PROPOFOL N/A 05/03/2015   Procedure: COLONOSCOPY WITH PROPOFOL;  Surgeon: Lucilla Lame, MD;  Location: [facility];  Service: Endoscopy;  Laterality: N/A;  CPAP  . DILATION AND CURETTAGE OF UTERUS  1970  . ENDOMETRIAL BIOPSY    . EYE SURGERY Right 2017  . POLYPECTOMY  05/03/2015   Procedure: POLYPECTOMY;  Surgeon: Lucilla Lame, MD;  Location: [facility];  Service: Endoscopy;;  . ROTATOR CUFF REPAIR  2007  . SPINE SURGERY      Family Psychiatric History: I have reviewed family psychiatric history from my progress note on 07/25/2018.  Family History:  Family History  Problem Relation Age of Onset  . Arthritis Mother   . Alzheimer's disease Father   . Heart disease Sister   . Cancer Brother   . Ovarian cancer Neg Hx   . Breast cancer Neg Hx   . Colon cancer Neg Hx   . Diabetes Neg Hx     Social History: I have reviewed social history from my progress note on 07/25/2018. Social History   Socioeconomic History  . Marital status: Widowed    Spouse name: Not on file  . Number of children: 2  . Years of education: Not on file  . Highest education level: Some college, no degree  Occupational History   . Not on file  Social Needs  . Financial resource strain: Hard  . Food insecurity    Worry: Often true    Inability: Often true  . Transportation needs    Medical: No    Non-medical: No  Tobacco Use  . Smoking status: Never Smoker  . Smokeless tobacco: Never Used  Substance and Sexual Activity  . Alcohol use: No    Alcohol/week: 0.0 standard drinks  . Drug use: No  . Sexual activity: Not Currently  Lifestyle  . Physical activity    Days per week: 0 days    Minutes per session: 0 min  . Stress: To some extent  Relationships  . Social Herbalist on phone: Not on file    Gets together: Not on file    Attends religious service: More than 4 times per  year    Active member of club or organization: No    Attends meetings of clubs or organizations: Never    Relationship status: Widowed  Other Topics Concern  . Not on file  Social History Narrative  . Not on file    Allergies:  Allergies  Allergen Reactions  . Linaclotide Swelling    Metabolic Disorder Labs: Lab Results  Component Value Date   HGBA1C 5.4 08/01/2018   Lab Results  Component Value Date   PROLACTIN 8.1 08/01/2018   Lab Results  Component Value Date   CHOL 245 (H) 08/01/2018   TRIG 187 (H) 08/01/2018   HDL 49 08/01/2018   LDLCALC 159 (H) 08/01/2018   Lab Results  Component Value Date   TSH 2.830 08/10/2018   TSH 5.550 (H) 08/01/2018    Therapeutic Level Labs: No results found for: LITHIUM No results found for: VALPROATE No components found for:  CBMZ  Current Medications: Current Outpatient Medications  Medication Sig Dispense Refill  . meloxicam (MOBIC) 7.5 MG tablet     . acetaminophen (TYLENOL) 500 MG tablet Take 500 mg by mouth every 6 (six) hours as needed.    . bisoprolol-hydrochlorothiazide (ZIAC) 2.5-6.25 MG tablet TAKE ONE TABLET BY MOUTH EVERY DAY FOR BLOOD PRESSURE (HIGH) 30 tablet 5  . Calcium Carb-Cholecalciferol 600-800 MG-UNIT TABS Take by mouth.    .  Carboxymethylcellulose Sodium (THERATEARS OP) Apply to eye.    . Cholecalciferol (VITAMIN D3) 5000 units TABS Take 5,000 Units by mouth daily.    Marland Kitchen dicyclomine (BENTYL) 10 MG capsule Take 10 mg by mouth 4 (four) times daily -  before meals and at bedtime.    . docusate sodium (COLACE) 50 MG capsule Take 50 mg by mouth daily as needed for mild constipation. Mon, Wed, Fri    . escitalopram (LEXAPRO) 20 MG tablet Take 1 tablet (20 mg total) by mouth daily. 90 tablet 0  . estradiol (ESTRACE) 0.1 MG/GM vaginal cream Use once a week as needed 42.5 g 1  . fluticasone (FLONASE) 50 MCG/ACT nasal spray 1 SPRAY IN EACH NOSTRIL ONCE ADAY AS NEEDED 16 g 1  . hydrocortisone cream 0.5 % Apply 1 application topically 2 (two) times daily. 30 g 0  . ibuprofen (ADVIL,MOTRIN) 200 MG tablet Take 200 mg by mouth every 6 (six) hours as needed.    Marland Kitchen Lifitegrast (XIIDRA) 5 % SOLN Apply to eye.    . Liniments (SALONPAS PAIN RELIEF PATCH EX) Apply 1 patch topically 2 (two) times daily.    Marland Kitchen lubiprostone (AMITIZA) 8 MCG capsule Take 1 capsule po bid prn ibs constipation. 60 capsule 3  . mirtazapine (REMERON) 15 MG tablet Take 0.5 tablets (7.5 mg total) by mouth at bedtime. For sleep 15 tablet 3  . Multiple Vitamin (MULTI-VITAMIN) tablet Take by mouth.    . Multiple Vitamins-Minerals (CENTRUM SILVER 50+WOMEN) TABS Take by mouth.    . oxybutynin (DITROPAN-XL) 10 MG 24 hr tablet Take 1 tablet (10 mg total) by mouth daily. 30 tablet 3  . pantoprazole (PROTONIX) 40 MG tablet Take 1 tablet (40 mg total) by mouth daily. 90 tablet 1  . promethazine (PHENERGAN) 25 MG tablet Take by mouth.    . RESTASIS 0.05 % ophthalmic emulsion     . rOPINIRole (REQUIP) 1 MG tablet TAKE ONE TABLET 3 TIMES DAILY AS NEEDED FOR RESTLESS LEGS 90 tablet 1  . simethicone (MYLICON) 80 MG chewable tablet Chew by mouth.    . traMADol (ULTRAM) 50 MG  tablet Take 1 tablet (50 mg total) by mouth every 12 (twelve) hours as needed. 20 tablet 0  . trolamine  salicylate (ASPERCREME) 10 % cream Apply topically.     No current facility-administered medications for this visit.      Musculoskeletal: Strength & Muscle Tone: UTA Gait & Station: Reports she walks with a cane Patient leans: N/A  Psychiatric Specialty Exam: Review of Systems  Musculoskeletal: Positive for back pain, joint pain and myalgias.  Psychiatric/Behavioral: The patient is nervous/anxious and has insomnia.   All other systems reviewed and are negative.   There were no vitals taken for this visit.There is no height or weight on file to calculate BMI.  General Appearance: UTA  Eye Contact:  UTA  Speech:  Clear and Coherent  Volume:  Normal  Mood:  Anxious  Affect:  UTA  Thought Process:  Goal Directed and Descriptions of Associations: Intact  Orientation:  Full (Time, Place, and Person)  Thought Content: Logical   Suicidal Thoughts:  No  Homicidal Thoughts:  No  Memory:  Immediate;   Fair Recent;   Fair Remote;   Fair  Judgement:  Fair  Insight:  Fair  Psychomotor Activity:  UTA  Concentration:  Concentration: Fair and Attention Span: Fair  Recall:  [employer] of Knowledge: Fair  Language: Fair  Akathisia:  No  Handed:  Right  AIMS (if indicated): UTA  Assets:  Communication Skills Desire for Improvement Social Support  ADL's:  Intact  Cognition: WNL  Sleep:  Restless due to pain   Screenings: Mini-Mental     Clinical Support from 03/06/2019 in [facility] from 03/02/2018 in [facility], [facility]  Total Score (max 30 points )  30  27    PHQ2-9     Clinical Support from 03/06/2019 in [facility], [facility] Office Visit from 11/08/2018 in [facility], [facility] Office Visit from 08/08/2018 in [facility], [facility] Office Visit from 06/30/2018 in [facility], [facility] Office Visit from 05/02/2018 in [facility], [facility]  PHQ-2 Total Score  0  0  1  0  0       Assessment and  Plan: Brylei is an 85 year old female, has a history of GAD, MDD, history of psychosis, essential hypertension, chronic pain, OSA, vision loss, hearing loss was evaluated by phone today.  Patient with psychosocial stressors of the current pandemic as well as her own health problems.  Patient however is currently making progress with regards to her mood and will benefit from the following plan.  Plan as noted below.  Plan GAD-stable Lexapro 20 mg p.o. daily.  MDD-stable Lexapro as prescribed   Delusional disorder-in remission Discontinue Abilify.  Discussed with patient to monitor her symptoms closely.  She will let writer know if she has any recurring delusions.  Insomnia-restless She does struggle with sleep problems due to pain.  She will have a discussion with her primary care provider. Mirtazapine 7.5 mg p.o. nightly.  Follow-up in clinic in 6 to 8 weeks or sooner if needed.  January 21 at 11 AM  I have spent atleast 15 minutes non face to face with patient today. More than 50 % of the time was spent for psychoeducation and supportive psychotherapy and care coordination. This note was generated in part or whole with voice recognition software. Voice recognition is usually quite accurate but there are transcription errors that can and very often do occur. I apologize for any typographical  errors that were not detected and corrected.       Ursula Alert, MD 06/21/2019, 12:51 PM

## 2019-06-27 ENCOUNTER — Ambulatory Visit (INDEPENDENT_AMBULATORY_CARE_PROVIDER_SITE_OTHER): Payer: Medicare HMO | Admitting: Nurse Practitioner

## 2019-06-27 ENCOUNTER — Other Ambulatory Visit: Payer: Self-pay

## 2019-06-27 ENCOUNTER — Encounter: Payer: Self-pay | Admitting: Nurse Practitioner

## 2019-06-27 ENCOUNTER — Ambulatory Visit: Payer: Medicare HMO | Admitting: Internal Medicine

## 2019-06-27 VITALS — BP 168/80 | HR 78 | Temp 96.4°F | Resp 16 | Ht 65.0 in | Wt 158.0 lb

## 2019-06-27 DIAGNOSIS — N390 Urinary tract infection, site not specified: Secondary | ICD-10-CM | POA: Diagnosis not present

## 2019-06-27 DIAGNOSIS — R300 Dysuria: Secondary | ICD-10-CM

## 2019-06-27 DIAGNOSIS — G8929 Other chronic pain: Secondary | ICD-10-CM | POA: Diagnosis not present

## 2019-06-27 DIAGNOSIS — R69 Illness, unspecified: Secondary | ICD-10-CM | POA: Diagnosis not present

## 2019-06-27 DIAGNOSIS — M545 Low back pain: Secondary | ICD-10-CM

## 2019-06-27 DIAGNOSIS — I10 Essential (primary) hypertension: Secondary | ICD-10-CM | POA: Diagnosis not present

## 2019-06-27 DIAGNOSIS — N3000 Acute cystitis without hematuria: Secondary | ICD-10-CM | POA: Diagnosis not present

## 2019-06-27 DIAGNOSIS — F411 Generalized anxiety disorder: Secondary | ICD-10-CM | POA: Diagnosis not present

## 2019-06-27 LAB — POCT URINALYSIS DIPSTICK
Bilirubin, UA: NEGATIVE
Blood, UA: NEGATIVE
Glucose, UA: NEGATIVE
Ketones, UA: NEGATIVE
Nitrite, UA: NEGATIVE
Protein, UA: NEGATIVE
Spec Grav, UA: 1.02 (ref 1.010–1.025)
Urobilinogen, UA: 0.2 E.U./dL
pH, UA: 6 (ref 5.0–8.0)

## 2019-06-27 MED ORDER — BISOPROLOL-HYDROCHLOROTHIAZIDE 5-6.25 MG PO TABS: 1.0000 | ORAL_TABLET | Freq: Every day | ORAL | 2 refills | Status: DC

## 2019-06-27 MED ORDER — MELOXICAM 7.5 MG PO TABS: 7.5000 mg | ORAL_TABLET | Freq: Every day | ORAL | 2 refills | Status: DC

## 2019-06-27 MED ORDER — SULFAMETHOXAZOLE-TRIMETHOPRIM 400-80 MG PO TABS: 1.0000 | ORAL_TABLET | Freq: Two times a day (BID) | ORAL | 0 refills | Status: DC

## 2019-06-27 NOTE — Progress Notes (Signed)
[facility] [place], [place] [address]  Internal MEDICINE  Office Visit Note  Patient Name: Kristen Pennington  [id number]  [id number]  Date of Service: 07/02/2019   Pt is here for a sick visit.  Chief Complaint  Patient presents with  . Urinary Tract Infection    burning and dark urine      The patient is here for acute visit. She states that she has had vaginal irritation and burning when she urinates. States that urine has been dark in color. This has been going on for about a week. She states that symptoms have gradually been getting worse.  Her blood pressure is moderately elevated. States that she has had some intermittent nose bleeds. Most severe one was this past Sunday. She was able to get it stopped on her own without problems.  She also states she is having a lot of pain in her lower back which radiates to her hips. Pain can be so severe, it is hard to get moving out of bed in the mornings. She currently takes two extra strength tylenol in the mornings and sometimes, she will take it again in the afternoon. This used to be effective, but is helping less and less.        Current Medication:  Outpatient Encounter Medications as of 06/27/2019  Medication Sig Note  . acetaminophen (TYLENOL) 500 MG tablet Take 500 mg by mouth every 6 (six) hours as needed.   . bisoprolol-hydrochlorothiazide (ZIAC) 5-6.25 MG tablet Take 1 tablet by mouth daily.   . Calcium Carb-Cholecalciferol 600-800 MG-UNIT TABS Take by mouth.   . Carboxymethylcellulose Sodium (THERATEARS OP) Apply to eye.   . Cholecalciferol (VITAMIN D3) 5000 units TABS Take 5,000 Units by mouth daily.   Marland Kitchen dicyclomine (BENTYL) 10 MG capsule Take 10 mg by mouth 4 (four) times daily -  before meals and at bedtime.   . docusate sodium (COLACE) 50 MG capsule Take 50 mg by mouth daily as needed for mild constipation. Mon, Wed, Fri   . escitalopram (LEXAPRO) 20 MG tablet Take 1 tablet (20 mg total)  by mouth daily.   Marland Kitchen estradiol (ESTRACE) 0.1 MG/GM vaginal cream Use once a week as needed   . fluticasone (FLONASE) 50 MCG/ACT nasal spray 1 SPRAY IN EACH NOSTRIL ONCE ADAY AS NEEDED   . hydrocortisone cream 0.5 % Apply 1 application topically 2 (two) times daily.   Marland Kitchen ibuprofen (ADVIL,MOTRIN) 200 MG tablet Take 200 mg by mouth every 6 (six) hours as needed.   Marland Kitchen Lifitegrast (XIIDRA) 5 % SOLN Apply to eye.   . Liniments (SALONPAS PAIN RELIEF PATCH EX) Apply 1 patch topically 2 (two) times daily.   Marland Kitchen lubiprostone (AMITIZA) 8 MCG capsule Take 1 capsule po bid prn ibs constipation.   . meloxicam (MOBIC) 7.5 MG tablet Take 1 tablet (7.5 mg total) by mouth daily.   . Multiple Vitamin (MULTI-VITAMIN) tablet Take by mouth.   . Multiple Vitamins-Minerals (CENTRUM SILVER 50+WOMEN) TABS Take by mouth.   . promethazine (PHENERGAN) 25 MG tablet Take by mouth.   . RESTASIS 0.05 % ophthalmic emulsion    . simethicone (MYLICON) 80 MG chewable tablet Chew by mouth.   . sulfamethoxazole-trimethoprim (BACTRIM) 400-80 MG tablet Take 1 tablet by mouth 2 (two) times daily.   . traMADol (ULTRAM) 50 MG tablet Take 1 tablet (50 mg total) by mouth every 12 (twelve) hours as needed.   . trolamine salicylate (ASPERCREME) 10 % cream Apply topically. 04/08/2016:  Received from: [place]: Apply topically as needed.  . [DISCONTINUED] bisoprolol-hydrochlorothiazide (ZIAC) 2.5-6.25 MG tablet TAKE ONE TABLET BY MOUTH EVERY DAY FOR BLOOD PRESSURE (HIGH)   . [DISCONTINUED] meloxicam (MOBIC) 7.5 MG tablet    . [DISCONTINUED] mirtazapine (REMERON) 15 MG tablet Take 0.5 tablets (7.5 mg total) by mouth at bedtime. For sleep   . [DISCONTINUED] oxybutynin (DITROPAN-XL) 10 MG 24 hr tablet Take 1 tablet (10 mg total) by mouth daily.   . [DISCONTINUED] pantoprazole (PROTONIX) 40 MG tablet Take 1 tablet (40 mg total) by mouth daily.   . [DISCONTINUED] rOPINIRole (REQUIP) 1 MG tablet TAKE ONE TABLET 3 TIMES  DAILY AS NEEDED FOR RESTLESS LEGS    No facility-administered encounter medications on file as of 06/27/2019.      Medical History: Past Medical History:  Diagnosis Date  . Arthritis   . Atrophic vaginitis 03/06/2013   Last Assessment & Plan:  She will plan to continue estradiol vaginal cream.  Prescription was rewritten stipulating use of the generic product.   . Atypical migraine 03/06/2013   Last Assessment & Plan:  Since she rarely takes sumatriptan, we will discontinue it. Continue sparing use of OTC migraine products.   . Avitaminosis D 03/06/2013   Last Assessment & Plan:  Recheck vitamin D level. Plan to continue vitamin D supplementation.   . Ceratitis 08/01/2013   Last Assessment & Plan:  Because of the expense, she will discontinue Restasis. She will use over-the-counter moisturizing eyedrops and liquid gel.   . Colon polyp   . 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   . 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   . Cystocele, midline 03/06/2013  . Demoralization and apathy 03/06/2013   Last Assessment & Plan:  Since she has been taking bupropion only once a day, I will write the prescription with the correct instructions and correct quantity. She has done well on this for years and she is encouraged to take it regularly   . Depression   . Environmental allergies   . Epistaxis   . Essential (primary) hypertension 05/17/2013   Last Assessment & Plan:  Her blood pressure is well-controlled. We will plan to continue hydrochlorothiazide and benazepril.  Both are generic and should be affordable on her health plan.   Marland Kitchen GERD (gastroesophageal reflux disease)   . Hemorrhoid   . 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 We will continue that regimen.   . Sinusitis   . Sleep apnea    CPAP     Today's Vitals   06/27/19 1456  BP: (!) 168/80  Pulse: 78  Resp: 16  Temp: (!) 96.4 F (35.8 C)  SpO2: 97%  Weight: 158 lb (71.7 kg)  Height: 5\' 5"  (1.651 m)   Body mass index is 26.29 kg/m.  Review of Systems  Constitutional: Positive for fatigue. Negative for activity change, chills and unexpected weight change.  HENT: Negative for congestion, postnasal drip, rhinorrhea, sneezing and sore throat.   Respiratory: Negative for cough, chest tightness, shortness of breath and wheezing.   Cardiovascular: Negative for chest pain and palpitations.       Moderately elevated blood pressure today.  Gastrointestinal: Negative for abdominal pain, constipation, diarrhea, nausea and vomiting.  Endocrine: Negative for cold intolerance, heat intolerance, polydipsia and polyuria.  Genitourinary: Positive for dysuria, flank pain, frequency and urgency.  Musculoskeletal: Positive for back pain and myalgias. Negative for arthralgias, joint swelling and neck pain.       Intermittent low back pain which can be severe. Started back in September when weather changes to cooler temperatures. Yesterday and today, she has been pain free.   Skin: Negative for rash.  Neurological: Positive for weakness and headaches. Negative for tremors and numbness.  Hematological: Negative for adenopathy. Does not bruise/bleed easily.  Psychiatric/Behavioral: Negative for behavioral problems (Depression), sleep disturbance and suicidal ideas. The patient is nervous/anxious.     Physical Exam Vitals and nursing note reviewed.  Constitutional:      General: She is not in acute distress.    Appearance: Normal appearance. She is well-developed. She is not diaphoretic.  HENT:     Head: Normocephalic and atraumatic.     Mouth/Throat:     Pharynx: No oropharyngeal exudate.  Eyes:      Pupils: Pupils are equal, round, and reactive to light.  Neck:     Thyroid: No thyromegaly.     Vascular: No JVD.     Trachea: No tracheal deviation.  Cardiovascular:     Rate and Rhythm: Normal rate and regular rhythm.     Heart sounds: Normal heart sounds. No murmur. No friction rub. No gallop.   Pulmonary:     Effort: Pulmonary effort is normal. No respiratory distress.     Breath sounds: Normal breath sounds. No wheezing or rales.  Chest:     Chest wall: No tenderness.  Abdominal:     Palpations: Abdomen is soft.     Tenderness: There is no abdominal tenderness. There is no guarding.  Genitourinary:    Comments: Urine sample showing small amount of WBC today.  Musculoskeletal:        General: Normal range of motion.     Cervical back: Normal range of motion and neck supple.     Comments:  She does have moderate lower back pain, worse with palpation and worse with sitting or standing for long periods of time. She is using a walking cane to help with mobility.  Lymphadenopathy:     Cervical: No cervical adenopathy.  Skin:    General: Skin is warm and dry.  Neurological:     Mental Status: She is alert and oriented to person, place, and time.     Cranial Nerves: No cranial nerve deficit.  Psychiatric:        Attention and Perception: Attention and perception normal.        Mood and Affect: Affect normal. Mood is anxious and depressed.        Speech: Speech normal.        Behavior: Behavior normal. Behavior is cooperative.        Thought Content: Thought content normal.        Cognition and Memory: Cognition and memory normal.        Judgment: Judgment normal.   Assessment/Plan: 1. Urinary tract infection without hematuria, site unspecified Add low dose bactrim BID for 7 days. Send urine for culture and sensitivity and adust antibiotics as indicated  - CULTURE, URINE COMPREHENSIVE - sulfamethoxazole-trimethoprim (BACTRIM) 400-80 MG tablet; Take 1 tablet by mouth 2 (two)  times daily.  Dispense: 14 tablet; Refill: 0  2. Essential (primary) hypertension Increase dose of Ziac  to 5/6.25mg  tablets daily. Advised she limit her sodium intake and increase water intake.  - bisoprolol-hydrochlorothiazide (ZIAC) 5-6.25 MG tablet; Take 1 tablet by mouth daily.  Dispense: 30 tablet; Refill: 2  3. Chronic bilateral low back pain without sciatica Add meloxicam 7.5mg  daily. May take tylenol twice daily as needed and as indicated.  - meloxicam (MOBIC) 7.5 MG tablet; Take 1 tablet (7.5 mg total) by mouth daily.  Dispense: 30 tablet; Refill: 2  4. Generalized anxiety disorder Continue regular visits with psychiatry as scheduled.   5. Dysuria - POCT Urinalysis Dipstick  General Counseling: havanna eitzen understanding of the findings of todays visit and agrees with plan of treatment. I have discussed any further diagnostic evaluation that may be needed or ordered today. We also reviewed her medications today. she has been encouraged to call the office with any questions or concerns that should arise related to todays visit.    Counseling:  Hypertension Counseling:   The following hypertensive lifestyle modification were recommended and discussed:  1. Limiting alcohol intake to less than 1 oz/day of ethanol:(24 oz of beer or 8 oz of wine or 2 oz of 100-proof whiskey). 2. Take baby ASA 81 mg daily. 3. Importance of regular aerobic exercise and losing weight. 4. Reduce dietary saturated fat and cholesterol intake for overall cardiovascular health. 5. Maintaining adequate dietary potassium, calcium, and magnesium intake. 6. Regular monitoring of the blood pressure. 7. Reduce sodium intake to less than 100 mmol/day (less than 2.3 gm of sodium or less than 6 gm of sodium choride)   This patient was seen by [place] with Dr Lavera Guise as a part of collaborative care agreement  Orders Placed This Encounter  Procedures  . CULTURE, URINE  COMPREHENSIVE  . POCT Urinalysis Dipstick    Meds ordered this encounter  Medications  . bisoprolol-hydrochlorothiazide (ZIAC) 5-6.25 MG tablet    Sig: Take 1 tablet by mouth daily.    Dispense:  30 tablet    Refill:  2    Please note change in dosing.    Order Specific Question:   Supervising Provider    Answer:   Lavera Guise [id number]  . sulfamethoxazole-trimethoprim (BACTRIM) 400-80 MG tablet    Sig: Take 1 tablet by mouth 2 (two) times daily.    Dispense:  14 tablet    Refill:  0    Order Specific Question:   Supervising Provider    Answer:   Lavera Guise [id number]  . meloxicam (MOBIC) 7.5 MG tablet    Sig: Take 1 tablet (7.5 mg total) by mouth daily.    Dispense:  30 tablet    Refill:  2    Order Specific Question:   Supervising Provider    Answer:   Lavera Guise [id number]    Time spent: 30 Minutes

## 2019-06-29 NOTE — Progress Notes (Signed)
Patient started on regular bactrim at her visit.

## 2019-06-30 ENCOUNTER — Other Ambulatory Visit: Payer: Self-pay

## 2019-06-30 ENCOUNTER — Other Ambulatory Visit: Payer: Self-pay | Admitting: Psychiatry

## 2019-06-30 ENCOUNTER — Telehealth: Payer: Self-pay

## 2019-06-30 DIAGNOSIS — F411 Generalized anxiety disorder: Secondary | ICD-10-CM

## 2019-06-30 DIAGNOSIS — K219 Gastro-esophageal reflux disease without esophagitis: Secondary | ICD-10-CM

## 2019-06-30 DIAGNOSIS — F5105 Insomnia due to other mental disorder: Secondary | ICD-10-CM

## 2019-06-30 DIAGNOSIS — F330 Major depressive disorder, recurrent, mild: Secondary | ICD-10-CM

## 2019-06-30 LAB — CULTURE, URINE COMPREHENSIVE

## 2019-06-30 MED ORDER — PANTOPRAZOLE SODIUM 40 MG PO TBEC: 40.0000 mg | DELAYED_RELEASE_TABLET | Freq: Every day | ORAL | 1 refills | Status: DC

## 2019-06-30 MED ORDER — OXYBUTYNIN CHLORIDE ER 10 MG PO TB24: 10.0000 mg | ORAL_TABLET | Freq: Every day | ORAL | 3 refills | Status: DC

## 2019-06-30 MED ORDER — MIRTAZAPINE 15 MG PO TABS: 7.5000 mg | ORAL_TABLET | Freq: Every day | ORAL | 3 refills | Status: DC

## 2019-06-30 MED ORDER — ROPINIROLE HCL 1 MG PO TABS: ORAL_TABLET | ORAL | 1 refills | Status: DC

## 2019-06-30 NOTE — Telephone Encounter (Signed)
She can take it once per day, in the morning is best. She can take tylenol twice daily if needed.

## 2019-06-30 NOTE — Telephone Encounter (Signed)
Sent Remeron to pharmacy 

## 2019-06-30 NOTE — Telephone Encounter (Signed)
Pt advised take meloxicam in morning and tylenol twice a day if needed

## 2019-07-02 DIAGNOSIS — R300 Dysuria: Secondary | ICD-10-CM | POA: Insufficient documentation

## 2019-07-03 ENCOUNTER — Telehealth: Payer: Self-pay

## 2019-07-03 NOTE — Telephone Encounter (Signed)
[facility] ORDER SIGNED AND PLACED IN [facility] FOLDER.

## 2019-07-10 ENCOUNTER — Telehealth: Payer: Self-pay

## 2019-07-10 NOTE — Telephone Encounter (Signed)
CONFIRMED AND SCREENED FOR 07-12-19 OV.

## 2019-07-12 ENCOUNTER — Other Ambulatory Visit: Payer: Self-pay

## 2019-07-12 ENCOUNTER — Ambulatory Visit (INDEPENDENT_AMBULATORY_CARE_PROVIDER_SITE_OTHER): Payer: Medicare HMO

## 2019-07-12 DIAGNOSIS — G4733 Obstructive sleep apnea (adult) (pediatric): Secondary | ICD-10-CM

## 2019-07-12 NOTE — Progress Notes (Signed)
95 percentile pressure 6   95th percentile leak 17.9   apnea index 17.2 /hr  apnea-hypopnea index  17.8 /hr   total days used  >4 hr 5 days  total days used <4 hr 5 days  Total compliance 8 percent  She is also having central AI 10.3  Gave her new mask to try and encouraged her to use cpap

## 2019-07-18 ENCOUNTER — Telehealth: Payer: Self-pay

## 2019-07-18 NOTE — Telephone Encounter (Signed)
Confirmed appointment with patient. klh °

## 2019-07-20 ENCOUNTER — Encounter: Payer: Self-pay | Admitting: Internal Medicine

## 2019-07-20 ENCOUNTER — Telehealth: Payer: Self-pay

## 2019-07-20 ENCOUNTER — Ambulatory Visit (INDEPENDENT_AMBULATORY_CARE_PROVIDER_SITE_OTHER): Payer: Medicare HMO | Admitting: Internal Medicine

## 2019-07-20 ENCOUNTER — Other Ambulatory Visit: Payer: Self-pay

## 2019-07-20 VITALS — BP 150/83 | HR 63 | Temp 97.0°F | Resp 16 | Ht 65.0 in | Wt 163.0 lb

## 2019-07-20 DIAGNOSIS — L209 Atopic dermatitis, unspecified: Secondary | ICD-10-CM

## 2019-07-20 DIAGNOSIS — Z9989 Dependence on other enabling machines and devices: Secondary | ICD-10-CM

## 2019-07-20 DIAGNOSIS — J328 Other chronic sinusitis: Secondary | ICD-10-CM

## 2019-07-20 DIAGNOSIS — G4733 Obstructive sleep apnea (adult) (pediatric): Secondary | ICD-10-CM

## 2019-07-20 DIAGNOSIS — K219 Gastro-esophageal reflux disease without esophagitis: Secondary | ICD-10-CM | POA: Diagnosis not present

## 2019-07-20 NOTE — Progress Notes (Signed)
[facility] [place], [place] [address]  Pulmonary Sleep Medicine   Office Visit Note  Patient Name: Kristen Pennington DOB: 1928-07-19 MRN [id number]  Date of Service: 07/20/2019  Complaints/HPI: Patient is here for routine follow-up for pulmonology. She has OSA and wears CPAP at night. Her CPAP compliance is 8%, and having central AHI of 10.3, she reports that she has been having difficulties with her mask lately. She has recently gotten a new mask and seems to be having an allergic reaction to the material. Mild atopic dermatitis to her upper lip and under her nose. She would like to discuss further options with her mask, she is currently wearing a bandage under the mask to prevent it from touching her skin and seems to be working for the moment. At this time she feels that her sinuses are under control. She mentions being under a lot of stress from worrying about her son that has COVID and requiring ICU care at this time. She denies shortness of breath, cough or wheezing.  ROS  General: (-) fever, (-) chills, (-) night sweats, (-) weakness Skin: (-) rashes, (-) itching,. Eyes: (-) visual changes, (-) redness, (-) itching. Nose and Sinuses: (-) nasal stuffiness or itchiness, (-) postnasal drip, (-) nosebleeds, (-) sinus trouble. Mouth and Throat: (-) sore throat, (-) hoarseness. Neck: (-) swollen glands, (-) enlarged thyroid, (-) neck pain. Respiratory: - cough, (-) bloody sputum, - shortness of breath, - wheezing. Cardiovascular: - ankle swelling, (-) chest pain. Lymphatic: (-) lymph node enlargement. Neurologic: (-) numbness, (-) tingling. Psychiatric: (-) anxiety, (-) depression   Current Medication: Outpatient Encounter Medications as of 07/20/2019  Medication Sig Note  . acetaminophen (TYLENOL) 500 MG tablet Take 500 mg by mouth every 6 (six) hours as needed.   . bisoprolol-hydrochlorothiazide (ZIAC) 5-6.25 MG tablet Take 1 tablet by mouth daily.   .  Calcium Carb-Cholecalciferol 600-800 MG-UNIT TABS Take by mouth.   . Carboxymethylcellulose Sodium (THERATEARS OP) Apply to eye.   . Cholecalciferol (VITAMIN D3) 5000 units TABS Take 5,000 Units by mouth daily.   Marland Kitchen dicyclomine (BENTYL) 10 MG capsule Take 10 mg by mouth 4 (four) times daily -  before meals and at bedtime.   . docusate sodium (COLACE) 50 MG capsule Take 50 mg by mouth daily as needed for mild constipation. Mon, Wed, Fri   . escitalopram (LEXAPRO) 20 MG tablet Take 1 tablet (20 mg total) by mouth daily.   Marland Kitchen estradiol (ESTRACE) 0.1 MG/GM vaginal cream Use once a week as needed   . fluticasone (FLONASE) 50 MCG/ACT nasal spray 1 SPRAY IN EACH NOSTRIL ONCE ADAY AS NEEDED   . hydrocortisone cream 0.5 % Apply 1 application topically 2 (two) times daily.   Marland Kitchen ibuprofen (ADVIL,MOTRIN) 200 MG tablet Take 200 mg by mouth every 6 (six) hours as needed.   Marland Kitchen Lifitegrast (XIIDRA) 5 % SOLN Apply to eye.   . Liniments (SALONPAS PAIN RELIEF PATCH EX) Apply 1 patch topically 2 (two) times daily.   Marland Kitchen lubiprostone (AMITIZA) 8 MCG capsule Take 1 capsule po bid prn ibs constipation.   . meloxicam (MOBIC) 7.5 MG tablet Take 1 tablet (7.5 mg total) by mouth daily.   . mirtazapine (REMERON) 15 MG tablet Take 0.5 tablets (7.5 mg total) by mouth at bedtime. For sleep   . Multiple Vitamin (MULTI-VITAMIN) tablet Take by mouth.   . Multiple Vitamins-Minerals (CENTRUM SILVER 50+WOMEN) TABS Take by mouth.   . oxybutynin (DITROPAN-XL) 10 MG 24 hr  tablet Take 1 tablet (10 mg total) by mouth daily.   . pantoprazole (PROTONIX) 40 MG tablet Take 1 tablet (40 mg total) by mouth daily.   . promethazine (PHENERGAN) 25 MG tablet Take by mouth.   . RESTASIS 0.05 % ophthalmic emulsion    . rOPINIRole (REQUIP) 1 MG tablet TAKE ONE TABLET 3 TIMES DAILY AS NEEDED FOR RESTLESS LEGS   . simethicone (MYLICON) 80 MG chewable tablet Chew by mouth.   . sulfamethoxazole-trimethoprim (BACTRIM) 400-80 MG tablet Take 1 tablet by mouth  2 (two) times daily.   . traMADol (ULTRAM) 50 MG tablet Take 1 tablet (50 mg total) by mouth every 12 (twelve) hours as needed.   . trolamine salicylate (ASPERCREME) 10 % cream Apply topically. 04/08/2016: Received from: Ali Chukson: Apply topically as needed.   No facility-administered encounter medications on file as of 07/20/2019.    Surgical History: Past Surgical History:  Procedure Laterality Date  . COLONOSCOPY  05-03-15  . COLONOSCOPY WITH PROPOFOL N/A 05/03/2015   Procedure: COLONOSCOPY WITH PROPOFOL;  Surgeon: Lucilla Lame, MD;  Location: [facility];  Service: Endoscopy;  Laterality: N/A;  CPAP  . DILATION AND CURETTAGE OF UTERUS  1970  . ENDOMETRIAL BIOPSY    . EYE SURGERY Right 2017  . POLYPECTOMY  05/03/2015   Procedure: POLYPECTOMY;  Surgeon: Lucilla Lame, MD;  Location: [facility];  Service: Endoscopy;;  . ROTATOR CUFF REPAIR  2007  . SPINE SURGERY      Medical History: Past Medical History:  Diagnosis Date  . Arthritis   . Atrophic vaginitis 03/06/2013   Last Assessment & Plan:  She will plan to continue estradiol vaginal cream.  Prescription was rewritten stipulating use of the generic product.   . Atypical migraine 03/06/2013   Last Assessment & Plan:  Since she rarely takes sumatriptan, we will discontinue it. Continue sparing use of OTC migraine products.   . Avitaminosis D 03/06/2013   Last Assessment & Plan:  Recheck vitamin D level. Plan to continue vitamin D supplementation.   . Ceratitis 08/01/2013   Last Assessment & Plan:  Because of the expense, she will discontinue Restasis. She will use over-the-counter moisturizing eyedrops and liquid gel.   . Colon polyp   . 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   . 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   . Cystocele, midline 03/06/2013  . Demoralization and apathy 03/06/2013   Last Assessment & Plan:  Since she has been taking bupropion only once a day, I will write the prescription with the correct instructions and correct quantity. She has done well on this for years and she is encouraged to take it regularly   . Depression   . Environmental allergies   . Epistaxis   . Essential (primary) hypertension 05/17/2013   Last Assessment & Plan:  Her blood pressure is well-controlled. We will plan to continue hydrochlorothiazide and benazepril.  Both are generic and should be affordable on her health plan.   Marland Kitchen GERD (gastroesophageal reflux disease)   . Hemorrhoid   . 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 We will continue that regimen.   . Sinusitis   .  Sleep apnea    CPAP    Family History: Family History  Problem Relation Age of Onset  . Arthritis Mother   . Alzheimer's disease Father   . Heart disease Sister   . Cancer Brother   . Ovarian cancer Neg Hx   . Breast cancer Neg Hx   . Colon cancer Neg Hx   . Diabetes Neg Hx     Social History: Social History   Socioeconomic History  . Marital status: Widowed    Spouse name: Not on file  . Number of children: 2  . Years of education: Not on file  . Highest education level: Some college, no degree  Occupational History  . Not on file  Tobacco Use  . Smoking status: Never Smoker  . Smokeless tobacco: Never Used  Substance and Sexual Activity  . Alcohol use: No    Alcohol/week: 0.0 standard drinks  . Drug use: No  . Sexual activity: Not Currently  Other Topics Concern  . Not on file  Social History Narrative  . Not on file   Social Determinants of Health   Financial Resource Strain: High Risk  . Difficulty of Paying Living Expenses: Hard  Food Insecurity:  Food Insecurity Present  . Worried About Charity fundraiser in the Last Year: Often true  . Ran Out of Food in the Last Year: Often true  Transportation Needs: No Transportation Needs  . Lack of Transportation (Medical): No  . Lack of Transportation (Non-Medical): No  Physical Activity: Inactive  . Days of Exercise per Week: 0 days  . Minutes of Exercise per Session: 0 min  Stress: Stress Concern Present  . Feeling of Stress : To some extent  Social Connections: Unknown  . Frequency of Communication with Friends and Family: Not on file  . Frequency of Social Gatherings with Friends and Family: Not on file  . Attends Religious Services: More than 4 times per year  . Active Member of Clubs or Organizations: No  . Attends Archivist Meetings: Never  . Marital Status: Widowed  Intimate Partner Violence: Not At Risk  . Fear of Current or Ex-Partner: No  . Emotionally Abused: No  . Physically Abused: No  . Sexually Abused: No    Vital Signs: Blood pressure (!) 150/83, pulse 63, temperature (!) 97 F (36.1 C), resp. rate 16, height 5\' 5"  (1.651 m), weight 163 lb (73.9 kg), SpO2 96 %.  Examination: General Appearance: The patient is well-developed, well-nourished, and in no distress. Skin: Gross inspection of skin unremarkable, slight erythema to upper lip and under nose. Head: normocephalic, no gross deformities. Eyes: no gross deformities noted. ENT: ears appear grossly normal no exudates. Neck: Supple. No thyromegaly. No LAD. Respiratory: Clear bilaterally. Cardiovascular: Normal S1 and S2 without murmur or rub. Extremities: No cyanosis. pulses are equal. Neurologic: Alert and oriented. No involuntary movements.  LABS: Recent Results (from the past 2160 hour(s))  CULTURE, URINE COMPREHENSIVE     Status: None   Collection Time: 06/27/19 12:00 AM   Specimen: Urine   URINE  Result Value Ref Range   Urine Culture, Comprehensive Final report    Organism ID, Bacteria  Comment     Comment: Mixed urogenital flora 10,000-25,000 colony forming units per mL   POCT Urinalysis Dipstick     Status: Abnormal   Collection Time: 06/27/19  3:09 PM  Result Value Ref Range   Color, UA     Clarity, UA     Glucose,  UA Negative Negative   Bilirubin, UA negative    Ketones, UA negative    Spec Grav, UA 1.020 1.010 - 1.025   Blood, UA negative    pH, UA 6.0 5.0 - 8.0   Protein, UA Negative Negative   Urobilinogen, UA 0.2 0.2 or 1.0 E.U./dL   Nitrite, UA negative    Leukocytes, UA Small (1+) (A) Negative   Appearance     Odor      Radiology: DG Lumbar Spine Complete  Result Date: 08/11/2018 CLINICAL DATA:  Fall from ladder several years ago with persistent low back pain, initial encounter EXAM: LUMBAR SPINE - COMPLETE 4+ VIEW COMPARISON:  01/08/2016 FINDINGS: Postsurgical changes are again noted at L4-5. Vertebral body height is well maintained. No pars defects are noted. Diffuse aortic calcifications are seen. Mild osteophytic changes are noted as well. IMPRESSION: Stable postsurgical changes at L4-5. Mild degenerative change without acute abnormality. Electronically Signed   By: Inez Catalina M.D.   On: 08/11/2018 14:17    No results found.  No results found.    Assessment and Plan: Patient Active Problem List   Diagnosis Date Noted  . Dysuria 07/02/2019  . Delusional disorder ([place]) 03/08/2019  . MDD (major depressive disorder), recurrent episode, mild ([place]) 01/18/2019  . 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 History of psychosis 01/18/2019  . Gastroesophageal reflux disease without esophagitis 11/27/2018  . Irritable bowel syndrome with constipation 11/27/2018  . Chronic bilateral low back pain without sciatica 08/17/2018  . Vitamin B12 deficiency 08/17/2018  . Elevated TSH 08/17/2018  . Urinary tract infection without hematuria 08/16/2017  . Generalized anxiety disorder 08/16/2017  . Primary generalized (osteo)arthritis 08/16/2017  .  Melena 12/05/2015  . Blood in stool   . Benign neoplasm of transverse colon   . Benign neoplasm of cecum   . Ceratitis 08/01/2013  . Keratitis sicca ([place]) 08/01/2013  . Essential (primary) hypertension 05/17/2013  . Cystocele, midline 03/06/2013  . Demoralization and apathy 03/06/2013  . Atypical migraine 03/06/2013  . Hyperlipidemia 03/06/2013  . Jittery 03/06/2013  . 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 Atrophic vaginitis 03/06/2013  . 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 Avitaminosis D 03/06/2013  . Allergic rhinitis due to pollen 03/06/2013  . Body mass index between 19-24, adult 03/06/2013  . Breast screening 03/06/2013  . 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 Screening for depression 03/06/2013   1. OSA on CPAP Low compliance with CPAP. Has been having many issues with her mask. Most recently feels as though this current mask is causing her to breakout on her upper lip. Has been placing a small bandaid on her lip to prevent mask from directly touching her skin. States that this is comfortable for her and that the rash has seemed to improve. Discussed improving her compliance with her CPAP, would encourage her to wear CPAP nightly for more than 5 hours per night. Reports compliance with cleaning machine properly and changing tubing and filters as needed.  2. Atopic dermatitis, mild Slight redness to upper lip and under nose, states it has much improved since last visit. Continue with wearing a barrier between mask and skin as this seems to be improving symptoms.  3. Other chronic sinusitis Stable and controlled at this time, continue to monitor.  4. Gastroesophageal reflux disease without esophagitis Stable and well controlled at this time on current therapy, continue to monitor.  General Counseling: I have discussed the findings of the evaluation  and examination with Romie Minus.  I have also discussed any  further diagnostic evaluation thatmay be needed or ordered today. [place] verbalizes understanding of the findings of todays visit. We also reviewed her medications today and discussed drug interactions and side effects including but not limited excessive drowsiness and altered mental states. We also discussed that there is always a risk not just to her but also people around her. she has been encouraged to call the office with any questions or concerns that should arise related to todays visit.  No orders of the defined types were placed in this encounter.    Time spent: 20 This patient was seen by Orson Gear AGNP-C in Collaboration with Dr. Devona Konig as a part of collaborative care agreement.  I have personally obtained a history, examined the patient, evaluated laboratory and imaging results, formulated the assessment and plan and placed orders.    Allyne Gee, MD [facility] Pulmonary and Critical Care Sleep medicine

## 2019-07-20 NOTE — Telephone Encounter (Signed)
LMOM FOR PATIENT TO CONFIRM 07-25-19 OV.

## 2019-07-21 DIAGNOSIS — H04129 Dry eye syndrome of unspecified lacrimal gland: Secondary | ICD-10-CM | POA: Diagnosis not present

## 2019-07-21 DIAGNOSIS — G4730 Sleep apnea, unspecified: Secondary | ICD-10-CM | POA: Diagnosis not present

## 2019-07-21 DIAGNOSIS — G2581 Restless legs syndrome: Secondary | ICD-10-CM | POA: Diagnosis not present

## 2019-07-21 DIAGNOSIS — K219 Gastro-esophageal reflux disease without esophagitis: Secondary | ICD-10-CM | POA: Diagnosis not present

## 2019-07-21 DIAGNOSIS — I10 Essential (primary) hypertension: Secondary | ICD-10-CM | POA: Diagnosis not present

## 2019-07-21 DIAGNOSIS — Z008 Encounter for other general examination: Secondary | ICD-10-CM | POA: Diagnosis not present

## 2019-07-21 DIAGNOSIS — G4700 Insomnia, unspecified: Secondary | ICD-10-CM | POA: Diagnosis not present

## 2019-07-21 DIAGNOSIS — G8929 Other chronic pain: Secondary | ICD-10-CM | POA: Diagnosis not present

## 2019-07-21 DIAGNOSIS — R69 Illness, unspecified: Secondary | ICD-10-CM | POA: Diagnosis not present

## 2019-07-21 DIAGNOSIS — F329 Major depressive disorder, single episode, unspecified: Secondary | ICD-10-CM | POA: Diagnosis not present

## 2019-07-21 DIAGNOSIS — J309 Allergic rhinitis, unspecified: Secondary | ICD-10-CM | POA: Diagnosis not present

## 2019-07-25 ENCOUNTER — Ambulatory Visit (INDEPENDENT_AMBULATORY_CARE_PROVIDER_SITE_OTHER): Payer: Medicare HMO | Admitting: Nurse Practitioner

## 2019-07-25 ENCOUNTER — Encounter: Payer: Self-pay | Admitting: Nurse Practitioner

## 2019-07-25 ENCOUNTER — Other Ambulatory Visit: Payer: Self-pay

## 2019-07-25 VITALS — BP 173/90 | HR 60 | Resp 16 | Ht 65.0 in | Wt 163.0 lb

## 2019-07-25 DIAGNOSIS — G8929 Other chronic pain: Secondary | ICD-10-CM | POA: Diagnosis not present

## 2019-07-25 DIAGNOSIS — I10 Essential (primary) hypertension: Secondary | ICD-10-CM | POA: Diagnosis not present

## 2019-07-25 DIAGNOSIS — M545 Low back pain: Secondary | ICD-10-CM | POA: Diagnosis not present

## 2019-07-25 DIAGNOSIS — F22 Delusional disorders: Secondary | ICD-10-CM

## 2019-07-25 DIAGNOSIS — R300 Dysuria: Secondary | ICD-10-CM | POA: Diagnosis not present

## 2019-07-25 DIAGNOSIS — R69 Illness, unspecified: Secondary | ICD-10-CM | POA: Diagnosis not present

## 2019-07-25 LAB — POCT URINALYSIS DIPSTICK
Bilirubin, UA: NEGATIVE
Glucose, UA: NEGATIVE
Ketones, UA: NEGATIVE
Leukocytes, UA: NEGATIVE
Nitrite, UA: NEGATIVE
Protein, UA: NEGATIVE
Spec Grav, UA: 1.025 (ref 1.010–1.025)
Urobilinogen, UA: 0.2 E.U./dL
pH, UA: 5 (ref 5.0–8.0)

## 2019-07-25 NOTE — Progress Notes (Signed)
[facility] [address] [place], [place] [address]  Internal MEDICINE  Office Visit Note  Patient Name: Kristen Pennington  [id number]  [id number]  Date of Service: 07/29/2019  Chief Complaint  Patient presents with  . Follow-up    recheck urine   . Hypertension    Ms. Amell presents today for a follow-up on urinary symptoms. She reports resolution of her urinary burning and vaginal irritation. However, she states that she is still experiencing chronic severe low back and hip pain impacting her ability to complete her ADLs. She describes the pain as non-radiating and aching with a severity of 8-9/10. The pain is continuous and is exacerbated by bending. She takes extra strength Tylenol daily. She has also tried tramadol, but it was ineffective. She states that the only time she was getting complete pain relief was when she was taking an opioid, but that her daughter requested that it be discontinued. Has seen Dr. Sharlet Salina in the past. She also feels like her RLS symptoms have gotten worse        Current Medication: Outpatient Encounter Medications as of 07/25/2019  Medication Sig Note  . acetaminophen (TYLENOL) 500 MG tablet Take 500 mg by mouth every 6 (six) hours as needed.   . bisoprolol-hydrochlorothiazide (ZIAC) 5-6.25 MG tablet Take 1 tablet by mouth daily.   . Calcium Carb-Cholecalciferol 600-800 MG-UNIT TABS Take by mouth.   . Carboxymethylcellulose Sodium (THERATEARS OP) Apply to eye.   . Cholecalciferol (VITAMIN D3) 5000 units TABS Take 5,000 Units by mouth daily.   Marland Kitchen dicyclomine (BENTYL) 10 MG capsule Take 10 mg by mouth 4 (four) times daily -  before meals and at bedtime.   . docusate sodium (COLACE) 50 MG capsule Take 50 mg by mouth daily as needed for mild constipation. Mon, Wed, Fri   . escitalopram (LEXAPRO) 20 MG tablet Take 1 tablet (20 mg total) by mouth daily.   Marland Kitchen estradiol (ESTRACE) 0.1 MG/GM vaginal cream Use once a week as needed   .  fluticasone (FLONASE) 50 MCG/ACT nasal spray 1 SPRAY IN EACH NOSTRIL ONCE ADAY AS NEEDED   . ibuprofen (ADVIL,MOTRIN) 200 MG tablet Take 200 mg by mouth every 6 (six) hours as needed.   Marland Kitchen Lifitegrast (XIIDRA) 5 % SOLN Apply to eye.   . Liniments (SALONPAS PAIN RELIEF PATCH EX) Apply 1 patch topically 2 (two) times daily.   Marland Kitchen lubiprostone (AMITIZA) 8 MCG capsule Take 1 capsule po bid prn ibs constipation.   . meloxicam (MOBIC) 7.5 MG tablet Take 1 tablet (7.5 mg total) by mouth daily.   . mirtazapine (REMERON) 15 MG tablet Take 0.5 tablets (7.5 mg total) by mouth at bedtime. For sleep   . Multiple Vitamin (MULTI-VITAMIN) tablet Take by mouth.   . Multiple Vitamins-Minerals (CENTRUM SILVER 50+WOMEN) TABS Take by mouth.   . oxybutynin (DITROPAN-XL) 10 MG 24 hr tablet Take 1 tablet (10 mg total) by mouth daily.   . pantoprazole (PROTONIX) 40 MG tablet Take 1 tablet (40 mg total) by mouth daily.   . promethazine (PHENERGAN) 25 MG tablet Take by mouth.   Marland Kitchen rOPINIRole (REQUIP) 1 MG tablet TAKE ONE TABLET 3 TIMES DAILY AS NEEDED FOR RESTLESS LEGS   . simethicone (MYLICON) 80 MG chewable tablet Chew by mouth.   . [DISCONTINUED] hydrocortisone cream 0.5 % Apply 1 application topically 2 (two) times daily. (Patient not taking: Reported on 07/25/2019)   . [DISCONTINUED] RESTASIS 0.05 % ophthalmic emulsion    . [DISCONTINUED] sulfamethoxazole-trimethoprim (  BACTRIM) 400-80 MG tablet Take 1 tablet by mouth 2 (two) times daily. (Patient not taking: Reported on 07/25/2019)   . [DISCONTINUED] traMADol (ULTRAM) 50 MG tablet Take 1 tablet (50 mg total) by mouth every 12 (twelve) hours as needed. (Patient not taking: Reported on 07/25/2019)   . [DISCONTINUED] trolamine salicylate (ASPERCREME) 10 % cream Apply topically. 04/08/2016: Received from: [facility]: Apply topically as needed.   No facility-administered encounter medications on file as of 07/25/2019.    Surgical History: Past  Surgical History:  Procedure Laterality Date  . COLONOSCOPY  05-03-15  . COLONOSCOPY WITH PROPOFOL N/A 05/03/2015   Procedure: COLONOSCOPY WITH PROPOFOL;  Surgeon: Lucilla Lame, MD;  Location: [facility];  Service: Endoscopy;  Laterality: N/A;  CPAP  . DILATION AND CURETTAGE OF UTERUS  1970  . ENDOMETRIAL BIOPSY    . EYE SURGERY Right 2017  . POLYPECTOMY  05/03/2015   Procedure: POLYPECTOMY;  Surgeon: Lucilla Lame, MD;  Location: [facility];  Service: Endoscopy;;  . ROTATOR CUFF REPAIR  2007  . SPINE SURGERY      Medical History: Past Medical History:  Diagnosis Date  . Arthritis   . Atrophic vaginitis 03/06/2013   Last Assessment & Plan:  She will plan to continue estradiol vaginal cream.  Prescription was rewritten stipulating use of the generic product.   . Atypical migraine 03/06/2013   Last Assessment & Plan:  Since she rarely takes sumatriptan, we will discontinue it. Continue sparing use of OTC migraine products.   . Avitaminosis D 03/06/2013   Last Assessment & Plan:  Recheck vitamin D level. Plan to continue vitamin D supplementation.   . Ceratitis 08/01/2013   Last Assessment & Plan:  Because of the expense, she will discontinue Restasis. She will use over-the-counter moisturizing eyedrops and liquid gel.   . Colon polyp   . 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   . 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   . Cystocele, midline 03/06/2013  . Demoralization and apathy 03/06/2013   Last Assessment & Plan:  Since she has been taking bupropion only once a day, I will write the prescription with the correct instructions and correct quantity. She has done well on this for years and she is encouraged to take it regularly   . Depression   . Environmental allergies   . Epistaxis   .  Essential (primary) hypertension 05/17/2013   Last Assessment & Plan:  Her blood pressure is well-controlled. We will plan to continue hydrochlorothiazide and benazepril.  Both are generic and should be affordable on her health plan.   Marland Kitchen GERD (gastroesophageal reflux disease)   . Hemorrhoid   . 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 We will continue that regimen.   . Sinusitis   . Sleep apnea    CPAP    Family History: Family History  Problem Relation Age of Onset  . Arthritis Mother   . Alzheimer's disease Father   . Heart disease Sister   . Cancer Brother   . Ovarian cancer Neg Hx   . Breast cancer Neg Hx   . Colon cancer Neg Hx   . Diabetes Neg  Hx     Social History   Socioeconomic History  . Marital status: Widowed    Spouse name: Not on file  . Number of children: 2  . Years of education: Not on file  . Highest education level: Some college, no degree  Occupational History  . Not on file  Tobacco Use  . Smoking status: Never Smoker  . Smokeless tobacco: Never Used  Substance and Sexual Activity  . Alcohol use: No    Alcohol/week: 0.0 standard drinks  . Drug use: No  . Sexual activity: Not Currently  Other Topics Concern  . Not on file  Social History Narrative  . Not on file   Social Determinants of Health   Financial Resource Strain: High Risk  . Difficulty of Paying Living Expenses: Not on file  Food Insecurity: Food Insecurity Present  . Worried About Charity fundraiser in the Last Year: Not on file  . Ran Out of Food in the Last Year: Not on file  Transportation Needs: No Transportation Needs  . Lack of Transportation (Medical): Not on file  . Lack of Transportation (Non-Medical): Not on file  Physical Activity: Inactive  . Days of Exercise per Week: Not on file  . Minutes of Exercise per Session:  Not on file  Stress: Stress Concern Present  . Feeling of Stress : Not on file  Social Connections: Unknown  . Frequency of Communication with Friends and Family: Not on file  . Frequency of Social Gatherings with Friends and Family: Not on file  . Attends Religious Services: Not on file  . Active Member of Clubs or Organizations: Not on file  . Attends Archivist Meetings: Not on file  . Marital Status: Not on file  Intimate Partner Violence: Not At Risk  . Fear of Current or Ex-Partner: Not on file  . Emotionally Abused: Not on file  . Physically Abused: Not on file  . Sexually Abused: Not on file      Review of Systems  Constitutional: Positive for activity change and fatigue. Negative for chills and unexpected weight change.  HENT: Negative for congestion, postnasal drip, rhinorrhea, sinus pressure, sinus pain, sneezing and sore throat.   Respiratory: Negative for cough, chest tightness, shortness of breath and wheezing.   Cardiovascular: Negative for chest pain and palpitations.       Elevated blood pressure today.   Gastrointestinal: Negative for abdominal pain, constipation, diarrhea, nausea and vomiting.  Endocrine: Negative for cold intolerance, heat intolerance, polydipsia and polyuria.  Genitourinary: Positive for difficulty urinating, dysuria, flank pain, frequency and urgency.       Urinary hesitance  Musculoskeletal: Positive for back pain and myalgias. Negative for arthralgias, joint swelling and neck pain.       Intermittent low back pain which can be severe. Started back in September when weather changes to cooler temperatures. Yesterday and today, she has been pain free.   Skin: Negative for rash.  Neurological: Positive for weakness and headaches. Negative for tremors and numbness.  Hematological: Negative for adenopathy. Does not bruise/bleed easily.  Psychiatric/Behavioral: Negative for behavioral problems (Depression), sleep disturbance and suicidal  ideas. The patient is nervous/anxious.        Patient is routinely seeing psychiatry.    Today's Vitals   07/25/19 1538 07/25/19 1630  BP: (!) 183/94 (!) 173/90  Pulse: 60   Resp: 16   SpO2: 97%   Weight: 163 lb (73.9 kg)   Height: 5\' 5"  (1.651 m)  Body mass index is 27.12 kg/m.  Physical Exam Vitals and nursing note reviewed.  Constitutional:      General: She is not in acute distress.    Appearance: Normal appearance. She is well-developed. She is not diaphoretic.  HENT:     Head: Normocephalic and atraumatic.     Nose: Nose normal.     Mouth/Throat:     Pharynx: No oropharyngeal exudate.  Eyes:     Pupils: Pupils are equal, round, and reactive to light.  Neck:     Thyroid: No thyromegaly.     Vascular: No JVD.     Trachea: No tracheal deviation.  Cardiovascular:     Rate and Rhythm: Normal rate and regular rhythm.     Heart sounds: Normal heart sounds. No murmur. No friction rub. No gallop.   Pulmonary:     Effort: Pulmonary effort is normal. No respiratory distress.     Breath sounds: Normal breath sounds. No wheezing or rales.  Chest:     Chest wall: No tenderness.  Abdominal:     Palpations: Abdomen is soft.  Genitourinary:    Comments: Urine sample is negative for infection or other abnormalities.  Musculoskeletal:        General: Normal range of motion.     Cervical back: Normal range of motion and neck supple.     Comments: She does have moderate lower back pain, worse with palpation and worse with sitting or standing for long periods of time. She is using a walking cane to help with mobility.   Lymphadenopathy:     Cervical: No cervical adenopathy.  Skin:    General: Skin is warm and dry.  Neurological:     Mental Status: She is alert and oriented to person, place, and time. Mental status is at baseline.     Cranial Nerves: No cranial nerve deficit.  Psychiatric:        Behavior: Behavior normal.        Thought Content: Thought content normal.         Judgment: Judgment normal.    Assessment/Plan:  1. Chronic bilateral low back pain without sciatica Pain medication and conservative treatment has been ineffective. Refer to Dr. Sharlet Salina for further evaluation and treatment.  - Ambulatory referral to Pain Clinic  2. Essential (primary) hypertension Generally stable. Continue bp medication as prescribed   3. Dysuria - POCT Urinalysis Dipstick negative for infection or other abnormalities.   4. Delusional disorder ([place]) Continue regular visits with psychiatry as scheduled  General Counseling: kataleya watchman understanding of the findings of todays visit and agrees with plan of treatment. I have discussed any further diagnostic evaluation that may be needed or ordered today. We also reviewed her medications today. she has been encouraged to call the office with any questions or concerns that should arise related to todays visit.  This patient was seen by Leretha Pol FNP Collaboration with Dr Lavera Guise as a part of collaborative care agreement  Orders Placed This Encounter  Procedures  . Ambulatory referral to Pain Clinic  . POCT Urinalysis Dipstick      Total time spent: 30 Minutes   Time spent includes review of chart, medications, test results, and follow up plan with the patient.      Dr Lavera Guise Internal medicine

## 2019-08-03 ENCOUNTER — Encounter: Payer: Self-pay | Admitting: Psychiatry

## 2019-08-03 ENCOUNTER — Other Ambulatory Visit: Payer: Self-pay

## 2019-08-03 ENCOUNTER — Ambulatory Visit (INDEPENDENT_AMBULATORY_CARE_PROVIDER_SITE_OTHER): Payer: Medicare HMO | Admitting: Psychiatry

## 2019-08-03 DIAGNOSIS — F22 Delusional disorders: Secondary | ICD-10-CM

## 2019-08-03 DIAGNOSIS — F411 Generalized anxiety disorder: Secondary | ICD-10-CM | POA: Diagnosis not present

## 2019-08-03 DIAGNOSIS — F5105 Insomnia due to other mental disorder: Secondary | ICD-10-CM

## 2019-08-03 DIAGNOSIS — R69 Illness, unspecified: Secondary | ICD-10-CM | POA: Diagnosis not present

## 2019-08-03 DIAGNOSIS — F330 Major depressive disorder, recurrent, mild: Secondary | ICD-10-CM

## 2019-08-03 MED ORDER — ESCITALOPRAM OXALATE 20 MG PO TABS: 20.0000 mg | ORAL_TABLET | Freq: Every day | ORAL | 0 refills | Status: DC

## 2019-08-03 NOTE — Progress Notes (Signed)
Provider Location : [facility] Patient Location : Home   Virtual Visit via Telephone Note  I connected with Kristen Pennington on 08/03/19 at 11:00 AM EST by telephone and verified that I am speaking with the correct person using two identifiers.   I discussed the limitations, risks, security and privacy concerns of performing an evaluation and management service by telephone and the availability of in person appointments. I also discussed with the patient that there may be a patient responsible charge related to this service. The patient expressed understanding and agreed to proceed.     I discussed the assessment and treatment plan with the patient. The patient was provided an opportunity to ask questions and all were answered. The patient agreed with the plan and demonstrated an understanding of the instructions.   The patient was advised to call back or seek an in-person evaluation if the symptoms worsen or if the condition fails to improve as anticipated.   Wyatt MD OP Progress Note  08/03/2019 12:34 PM [name]  MRN:  [id number]  Chief Complaint:  Chief Complaint    Follow-up     HPI: [name] is an 86 year old Caucasian female, widowed, lives in [place], has a history of GAD, MDD, delusional disorder, insomnia, hearing loss, vision loss, OSA, hypertension was evaluated by phone today.  Patient preferred to do a phone call.  Patient today reports she is currently anxious about her son who is currently on a ventilator due to COVID-19 infection at the hospital nearby.  Patient reports she is very worried about him.  Patient reports she has started noticing some problems with her computer again.  She reports she went on her computer 2 days ago and felt like hackers were trying to steal things from her again.  She reports she has not gone back to her computer again the past 2 days.  As long as she stays away from her computer she feels fine.  Patient denies any suicidality,  homicidality or perceptual disturbances.  She reports her blood pressure has been elevated recently and she had an appointment with her primary care provider.  She is monitoring it very closely and will get in touch with her primary care as needed.  Patient reports she has been compliant on her other medications as prescribed.  She denies any other concerns today. Visit Diagnosis:    ICD-10-CM   1. Generalized anxiety disorder  F41.1 escitalopram (LEXAPRO) 20 MG tablet  2. MDD (major depressive disorder), recurrent episode, mild (HCC)  F33.0 escitalopram (LEXAPRO) 20 MG tablet  3. Delusional disorder ([place])  F22   4. Insomnia due to mental condition  F51.05     Past Psychiatric History: I have reviewed past psychiatric history from my progress note on 07/25/2018.  Past trials of Klonopin, Lexapro, Abilify, mirtazapine.  Past Medical History:  Past Medical History:  Diagnosis Date  . Arthritis   . Atrophic vaginitis 03/06/2013   Last Assessment & Plan:  She will plan to continue estradiol vaginal cream.  Prescription was rewritten stipulating use of the generic product.   . Atypical migraine 03/06/2013   Last Assessment & Plan:  Since she rarely takes sumatriptan, we will discontinue it. Continue sparing use of OTC migraine products.   . Avitaminosis D 03/06/2013   Last Assessment & Plan:  Recheck vitamin D level. Plan to continue vitamin D supplementation.   . Ceratitis 08/01/2013   Last Assessment & Plan:  Because of the expense, she will discontinue Restasis. She will  use over-the-counter moisturizing eyedrops and liquid gel.   . Colon polyp   . 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   . 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   . Cystocele, midline 03/06/2013  . Demoralization and apathy 03/06/2013    Last Assessment & Plan:  Since she has been taking bupropion only once a day, I will write the prescription with the correct instructions and correct quantity. She has done well on this for years and she is encouraged to take it regularly   . Depression   . Environmental allergies   . Epistaxis   . Essential (primary) hypertension 05/17/2013   Last Assessment & Plan:  Her blood pressure is well-controlled. We will plan to continue hydrochlorothiazide and benazepril.  Both are generic and should be affordable on her health plan.   Marland Kitchen GERD (gastroesophageal reflux disease)   . Hemorrhoid   . Inflammation of sacroiliac joint ([place]) 03/06/2013   Last Assessment & Plan:  She is doing well on her present regimen of fentanyl patch supplemented with sparing use of tramadol/acetaminophen. We will continue that regimen.   . Sinusitis   . Sleep apnea    CPAP    Past Surgical History:  Procedure Laterality Date  . COLONOSCOPY  05-03-15  . COLONOSCOPY WITH PROPOFOL N/A 05/03/2015   Procedure: COLONOSCOPY WITH PROPOFOL;  Surgeon: Lucilla Lame, MD;  Location: [facility];  Service: Endoscopy;  Laterality: N/A;  CPAP  . DILATION AND CURETTAGE OF UTERUS  1970  . ENDOMETRIAL BIOPSY    . EYE SURGERY Right 2017  . POLYPECTOMY  05/03/2015   Procedure: POLYPECTOMY;  Surgeon: Lucilla Lame, MD;  Location: [facility];  Service: Endoscopy;;  . ROTATOR CUFF REPAIR  2007  . SPINE SURGERY      Family Psychiatric History: I have reviewed family psychiatric history from my progress note on 07/25/2018. Family History:  Family History  Problem Relation Age of Onset  . Arthritis Mother   . Alzheimer's disease Father   . Heart disease Sister   . Cancer Brother   . Ovarian cancer Neg Hx   . Breast cancer Neg Hx   . Colon cancer Neg Hx   . Diabetes Neg Hx     Social History: Reviewed social history from my progress note on 07/25/2018. Social History   Socioeconomic History  . Marital status:  Widowed    Spouse name: Not on file  . Number of children: 2  . Years of education: Not on file  . Highest education level: Some college, no degree  Occupational History  . Not on file  Tobacco Use  . Smoking status: Never Smoker  . Smokeless tobacco: Never Used  Substance and Sexual Activity  . Alcohol use: No    Alcohol/week: 0.0 standard drinks  . Drug use: No  . Sexual activity: Not Currently  Other Topics Concern  . Not on file  Social History Narrative  . Not on file   Social Determinants of Health   Financial Resource Strain:   . Difficulty of Paying Living Expenses: Not on file  Food Insecurity:   . Worried About Charity fundraiser in the Last Year: Not on file  . Ran Out of Food in the Last Year: Not on file  Transportation Needs:   . Lack of  Transportation (Medical): Not on file  . Lack of Transportation (Non-Medical): Not on file  Physical Activity:   . Days of Exercise per Week: Not on file  . Minutes of Exercise per Session: Not on file  Stress:   . Feeling of Stress : Not on file  Social Connections:   . Frequency of Communication with Friends and Family: Not on file  . Frequency of Social Gatherings with Friends and Family: Not on file  . Attends Religious Services: Not on file  . Active Member of Clubs or Organizations: Not on file  . Attends Archivist Meetings: Not on file  . Marital Status: Not on file    Allergies:  Allergies  Allergen Reactions  . Linaclotide Swelling    Metabolic Disorder Labs: Lab Results  Component Value Date   HGBA1C 5.4 08/01/2018   Lab Results  Component Value Date   PROLACTIN 8.1 08/01/2018   Lab Results  Component Value Date   CHOL 245 (H) 08/01/2018   TRIG 187 (H) 08/01/2018   HDL 49 08/01/2018   LDLCALC 159 (H) 08/01/2018   Lab Results  Component Value Date   TSH 2.830 08/10/2018   TSH 5.550 (H) 08/01/2018    Therapeutic Level Labs: No results found for: LITHIUM No results found for:  VALPROATE No components found for:  CBMZ  Current Medications: Current Outpatient Medications  Medication Sig Dispense Refill  . acetaminophen (TYLENOL) 500 MG tablet Take 500 mg by mouth every 6 (six) hours as needed.    . bisoprolol-hydrochlorothiazide (ZIAC) 5-6.25 MG tablet Take 1 tablet by mouth daily. 30 tablet 2  . Calcium Carb-Cholecalciferol 600-800 MG-UNIT TABS Take by mouth.    . Carboxymethylcellulose Sodium (THERATEARS OP) Apply to eye.    . Cholecalciferol (VITAMIN D3) 5000 units TABS Take 5,000 Units by mouth daily.    Marland Kitchen dicyclomine (BENTYL) 10 MG capsule Take 10 mg by mouth 4 (four) times daily -  before meals and at bedtime.    . docusate sodium (COLACE) 50 MG capsule Take 50 mg by mouth daily as needed for mild constipation. Mon, Wed, Fri    . escitalopram (LEXAPRO) 20 MG tablet Take 1 tablet (20 mg total) by mouth daily. 90 tablet 0  . estradiol (ESTRACE) 0.1 MG/GM vaginal cream Use once a week as needed 42.5 g 1  . fluticasone (FLONASE) 50 MCG/ACT nasal spray 1 SPRAY IN EACH NOSTRIL ONCE ADAY AS NEEDED 16 g 1  . ibuprofen (ADVIL,MOTRIN) 200 MG tablet Take 200 mg by mouth every 6 (six) hours as needed.    Marland Kitchen Lifitegrast (XIIDRA) 5 % SOLN Apply to eye.    . Liniments (SALONPAS PAIN RELIEF PATCH EX) Apply 1 patch topically 2 (two) times daily.    Marland Kitchen lubiprostone (AMITIZA) 8 MCG capsule Take 1 capsule po bid prn ibs constipation. 60 capsule 3  . meloxicam (MOBIC) 7.5 MG tablet Take 1 tablet (7.5 mg total) by mouth daily. 30 tablet 2  . mirtazapine (REMERON) 15 MG tablet Take 0.5 tablets (7.5 mg total) by mouth at bedtime. For sleep 15 tablet 3  . Multiple Vitamin (MULTI-VITAMIN) tablet Take by mouth.    . Multiple Vitamins-Minerals (CENTRUM SILVER 50+WOMEN) TABS Take by mouth.    . oxybutynin (DITROPAN-XL) 10 MG 24 hr tablet Take 1 tablet (10 mg total) by mouth daily. 30 tablet 3  . pantoprazole (PROTONIX) 40 MG tablet Take 1 tablet (40 mg total) by mouth daily. 90 tablet 1  .  promethazine (PHENERGAN)  25 MG tablet Take by mouth.    Marland Kitchen rOPINIRole (REQUIP) 1 MG tablet TAKE ONE TABLET 3 TIMES DAILY AS NEEDED FOR RESTLESS LEGS 90 tablet 1  . simethicone (MYLICON) 80 MG chewable tablet Chew by mouth.     No current facility-administered medications for this visit.     Musculoskeletal: Strength & Muscle Tone: UTA Gait & Station: Reports as wnl  Patient leans: N/A  Psychiatric Specialty Exam: Review of Systems  Psychiatric/Behavioral: The patient is nervous/anxious.   All other systems reviewed and are negative.   There were no vitals taken for this visit.There is no height or weight on file to calculate BMI.  General Appearance: UTA  Eye Contact:  UTA  Speech:  Clear and Coherent  Volume:  Normal  Mood:  Anxious about her situational stressors  Affect:  UTA  Thought Process:  Goal Directed and Descriptions of Associations: Intact  Orientation:  Full (Time, Place, and Person)  Thought Content: Delusions reports she can cope  Suicidal Thoughts:  No  Homicidal Thoughts:  No  Memory:  Immediate;   Fair Recent;   Fair Remote;   Fair  Judgement:  Fair  Insight:  Fair  Psychomotor Activity:  UTA  Concentration:  Concentration: Fair and Attention Span: Fair  Recall:  [employer] of Knowledge: Fair  Language: Fair  Akathisia:  No  Handed:  Right  AIMS (if indicated):Denies tremors, rigidity  Assets:  Communication Skills Desire for Improvement Social Support  ADL's:  Intact  Cognition: WNL  Sleep:  Fair   Screenings: Mini-Mental     Clinical Support from 03/06/2019 in [facility], [place] from 03/02/2018 in [facility], [facility]  Total Score (max 30 points )  30  27    [id number]     Clinical Support from 03/06/2019 in [facility], [facility] Office Visit from 11/08/2018 in [facility], [facility] Office Visit from 08/08/2018 in [facility], [facility] Office Visit from 06/30/2018 in [facility],  [facility]02[facility]2018 [facility]  PHQ-2 Total Score  0  0  1  0  0       Assessment and Plan: Jaziyah is an 86 year old female who has a history of GAD, MDD, history of psychosis, essential hypertension, chronic pain, OSA, vision loss, hearing loss was evaluated by phone today.  Patient with psychosocial stressors of the current pandemic as well as her own health problems as well as her son who is currently with health issues.  Patient currently appears to be preoccupied with her delusions however reports she is able to cope with it.  We will continue to monitor closely.  Plan as noted below.  Plan GAD-stable Lexapro 20 mg p.o. daily  MDD-stable Lexapro as prescribed  Delusional disorder-stable Patient is currently not on Abilify. She is able to cope with her delusions.  However will monitor her closely.  Discussed with patient to reach out to writer if she has any worsening symptoms.  Insomnia-improving Mirtazapine 7.5 mg p.o. nightly  Follow-up in clinic in 2 weeks or sooner if needed.  February 9 at 10 AM  I have spent atleast 20 minutes non face to face with patient today. More than 50 % of the time was spent for ordering medications and test ,psychoeducation and supportive psychotherapy and care coordination,as well as documenting clinical information in electronic health record. This note was generated in part or whole with voice recognition software. Voice recognition is  usually quite accurate but there are transcription errors that can and very often do occur. I apologize for any typographical errors that were not detected and corrected.       Ursula Alert, MD 08/03/2019, 12:34 PM

## 2019-08-07 ENCOUNTER — Ambulatory Visit: Payer: Medicare HMO | Admitting: Nurse Practitioner

## 2019-08-12 DIAGNOSIS — G4733 Obstructive sleep apnea (adult) (pediatric): Secondary | ICD-10-CM | POA: Diagnosis not present

## 2019-08-17 ENCOUNTER — Encounter: Payer: Self-pay | Admitting: Pain Medicine

## 2019-08-17 NOTE — Progress Notes (Signed)
Patient: Kristen Pennington  Service Category: E/M  Provider: Gaspar Cola, MD  DOB: 08/06/1928  DOS: 08/21/2019  Location: Office  MRN: [id number]  Setting: Ambulatory outpatient  Referring Provider: Ronnell Freshwater, NP  Type: New Patient  Specialty: Interventional Pain Management  PCP: Lavera Guise, MD  Location: Remote location  Delivery: TeleHealth     Virtual Encounter - Pain Management PROVIDER NOTE: Information contained herein reflects review and annotations entered in association with encounter. Interpretation of such information and data should be left to medically-trained personnel. Information provided to patient can be located elsewhere in the medical record under "Patient Instructions". Document created using STT-dictation technology, any transcriptional errors that may result from process are unintentional.    Contact & Pharmacy Preferred: [phone] Home: [phone] (home) Mobile: [phone] (mobile) E-mail: [email or web address]  [place], [place] - [place] [address] Phone: [phone] Fax: [phone]   Pre-screening note:  Our staff contacted Kristen Pennington and offered her an "in person", "face-to-face" appointment versus a telephone encounter. She indicated preferring the telephone encounter, at this time.  Primary Reason(s) for Visit: Tele-Encounter for initial evaluation of one or more chronic problems (new to examiner) potentially causing chronic pain, and posing a threat to normal musculoskeletal function. (Level of risk: High) CC: Back Pain (L3, L4 in the center.  S/P surgeries )  I contacted Kristen Pennington on 08/21/2019 via telephone.      I clearly identified myself as Gaspar Cola, MD. I verified that I was speaking with the correct person using two identifiers (Name: Kristen Pennington, and date of birth: Oct 08, 1928).  Advanced Informed Consent I sought verbal advanced consent from Kristen Pennington for virtual visit interactions. I informed Kristen Pennington of possible security and privacy concerns, risks, and limitations associated with providing "not-in-person" medical evaluation and management services. I also informed Kristen Pennington of the availability of "in-person" appointments. Finally, I informed her that there would be a charge for the virtual visit and that she could be  personally, fully or partially, financially responsible for it. Kristen Pennington expressed understanding and agreed to proceed.   HPI  Kristen Pennington is a 86 y.o. year old, female patient, contacted today for an initial evaluation of her chronic pain. She has Cystocele, midline; Demoralization and apathy; Essential (primary) hypertension; Ceratitis; Atypical migraine; Hyperlipidemia; Jittery; Combined pyramidal-extrapyramidal syndrome; Atrophic vaginitis; Inflammation of sacroiliac joint ([place]); Avitaminosis D; Blood in stool; Benign neoplasm of transverse colon; Benign neoplasm of cecum; Urinary tract infection without hematuria; Generalized anxiety disorder; Primary generalized (osteo)arthritis; Allergic rhinitis due to pollen; Body mass index between 19-24, adult; Breast screening; Keratitis sicca ([place]); Melena; Other extrapyramidal disease and abnormal movement disorder; Screening for depression; Chronic low back pain (Bilateral) (R>L) w/o sciatica; Vitamin B12 deficiency; Elevated TSH; Gastroesophageal reflux disease without esophagitis; Irritable bowel syndrome with constipation; MDD (major depressive disorder), recurrent episode, mild ([place]); Insomnia due to mental condition; History of psychosis; Delusional disorder ([place]); Dysuria; Chronic pain syndrome; Pharmacologic therapy; Disorder of skeletal system; Problems influencing health status; History of lumbar spinal fusion; Chronic hip pain (Bilateral) (R>L); and Lumbar facet joint syndrome (Bilateral) on their problem list.   Onset and Duration: Date of injury:  2005 Cause of pain: fall 2005, ruptured disc, back surgery approx 2007 Severity: No change since onset, NAS-11 at its worse: 10/10, NAS-11 at its best: 5/10, NAS-11 now: 5/10 and NAS-11 on the average: 5/10 Timing: Night and bending Aggravating  Factors: Bending, Kneeling, Lifiting, Motion, Prolonged sitting, Squatting, Stooping  and Twisting Alleviating Factors: Cold packs, Medications and Walking Associated Problems: Day-time cramps, Night-time cramps, Depression, Swelling, Weakness and Pain that does not allow patient to sleep Quality of Pain: Aching, Deep, Feeling of constriction and Heavy Previous Examinations or Tests: X-rays Previous Treatments: Epidural steroid injections and Narcotic medications  According to the patient her primary area of pain is that of the lower back, starting in the midline but spreading bilaterally with the right being worse than the left.  She admits to having had a back surgery around 2009.  She also complains of bilateral hip pain with the right being worse than the left.  She denies any leg pain or numbness she also denies any pain in the area of the knees and the only thing that she does have in the lower extremities is some swelling.  Aside from this, the patient indicates also having some neck pain in the posterior aspect that seems to be localized to the C7 spinous process.  She describes this pain as being intermittent.  During our conversation it would seem that she had some "flight of ideas".  She mentioned that everything seems to have started around 2005 08/2004 when she had a fall and she ruptured a disc in the lumbar region.  She had some injections that were done in the area of the arm for fracture that she had and that area.  She also had some shoulder surgery and eventually she had the back surgery around 2009.  She has been complaining of arthritic type of pain in the lower back that seems to be getting worse with time.  This pain seems to be fluctuating.   In the past she used to treat this with some medications but the family had her stop the medicines because she was "misbehaving".  At this point she went on to talk about a IT consultant that got into her computer and from that point on the computer never worked again.  She did mention that she took Tylenol No. 4 and Klonopin in the past and these were some of the medicines that were eliminated from her regimen.  She complains that changes in the vital metric pressure seem to trigger her pain, which would be normal for arthritic type of pain.  Today she mentioned that she needs "more than Tylenol 500 mg".  I took this opportunity to explain to the patient that my specialty is that of interventional pain management and we would primarily try to help her with interventional therapies trying to avoid the use of any controlled substances.  She indicated that she had some injections done before her back surgery around 2009 and they did not work and so she is little hesitant to having any more injections done.  She also mentioned that she would prefer to be taking pain medicines.  Having reviewed the patient's prior history and medical issues I truly think that she would be a high risk to put on any controlled substances.  She has a history that includes psychosis, delusional disorder, major depressive disorder, and sleep apnea.  Today I had the patient do hyperextension maneuvers with hyperextension and rotation where she indicated having some discomfort in the lower back suggesting the possibility of lumbar facet syndrome.  Historic Controlled Substance Pharmacotherapy Review  Current opioid analgesics: Tramadol 50 mg, 1 tab PO BID  Highest recorded MME/day: 10 mg/day MME/day: 10 mg/day   Historical Monitoring: The patient  reports  no history of drug use. List of all UDS Test(s): No results found. List of other Serum/Urine Drug Screening Test(s):  No results found. Historical Background  Evaluation: [place] PMP: PDMP reviewed during this encounter. Two (2) year initial data search conducted.             PMP NARX Score Report:  Narcotic: 130 Sedative: 180 Stimulant: 000 Risk Assessment Profile: PMP NARX Overdose Risk Score: 190  Pharmacologic Plan: As per protocol, I have not taken over any controlled substance management, pending the results of ordered tests and/or consults.            Initial impression: Pending review of available data and ordered tests.  Meds   Current Outpatient Medications:  .  acetaminophen (TYLENOL) 500 MG tablet, Take 500 mg by mouth every 6 (six) hours as needed., Disp: , Rfl:  .  bisoprolol-hydrochlorothiazide (ZIAC) 5-6.25 MG tablet, Take 1 tablet by mouth daily., Disp: 30 tablet, Rfl: 2 .  Calcium Carb-Cholecalciferol 600-800 MG-UNIT TABS, Take by mouth., Disp: , Rfl:  .  Carboxymethylcellulose Sodium (THERATEARS OP), Apply to eye., Disp: , Rfl:  .  Cholecalciferol (VITAMIN D3) 5000 units TABS, Take 5,000 Units by mouth daily., Disp: , Rfl:  .  dicyclomine (BENTYL) 10 MG capsule, Take 10 mg by mouth 4 (four) times daily -  before meals and at bedtime., Disp: , Rfl:  .  escitalopram (LEXAPRO) 20 MG tablet, Take 1 tablet (20 mg total) by mouth daily., Disp: 90 tablet, Rfl: 0 .  estradiol (ESTRACE) 0.1 MG/GM vaginal cream, Use once a week as needed, Disp: 42.5 g, Rfl: 1 .  fluticasone (FLONASE) 50 MCG/ACT nasal spray, 1 SPRAY IN EACH NOSTRIL ONCE ADAY AS NEEDED, Disp: 16 g, Rfl: 1 .  ibuprofen (ADVIL,MOTRIN) 200 MG tablet, Take 200 mg by mouth every 6 (six) hours as needed., Disp: , Rfl:  .  Liniments (SALONPAS PAIN RELIEF PATCH EX), Apply 1 patch topically 2 (two) times daily., Disp: , Rfl:  .  lubiprostone (AMITIZA) 8 MCG capsule, Take 1 capsule po bid prn ibs constipation., Disp: 60 capsule, Rfl: 3 .  meloxicam (MOBIC) 7.5 MG tablet, Take 7.5 mg by mouth daily., Disp: , Rfl:  .  mirtazapine (REMERON) 15 MG tablet, Take 0.5 tablets (7.5 mg total) by  mouth at bedtime. For sleep, Disp: 15 tablet, Rfl: 3 .  Multiple Vitamins-Minerals (CENTRUM SILVER 50+WOMEN) TABS, Take by mouth., Disp: , Rfl:  .  oxybutynin (DITROPAN-XL) 10 MG 24 hr tablet, Take 1 tablet (10 mg total) by mouth daily., Disp: 30 tablet, Rfl: 3 .  pantoprazole (PROTONIX) 40 MG tablet, Take 1 tablet (40 mg total) by mouth daily., Disp: 90 tablet, Rfl: 1 .  promethazine (PHENERGAN) 25 MG tablet, Take 25 mg by mouth as needed. , Disp: , Rfl:  .  rOPINIRole (REQUIP) 1 MG tablet, TAKE ONE TABLET 3 TIMES DAILY AS NEEDED FOR RESTLESS LEGS, Disp: 90 tablet, Rfl: 1 .  simethicone (MYLICON) 80 MG chewable tablet, Chew by mouth., Disp: , Rfl:  .  Multiple Vitamin (MULTI-VITAMIN) tablet, Take by mouth., Disp: , Rfl:   ROS  Cardiovascular: High blood pressure Pulmonary or Respiratory: No reported pulmonary signs or symptoms such as wheezing and difficulty taking a deep full breath (Asthma), difficulty blowing air out (Emphysema), coughing up mucus (Bronchitis), persistent dry cough, or temporary stoppage of breathing during sleep Neurological: No reported neurological signs or symptoms such as seizures, abnormal skin sensations, urinary and/or fecal incontinence, being born with an  abnormal open spine and/or a tethered spinal cord Psychological-Psychiatric: Anxiousness and Depressed Gastrointestinal: Reflux or heatburn Genitourinary: No reported renal or genitourinary signs or symptoms such as difficulty voiding or producing urine, peeing blood, non-functioning kidney, kidney stones, difficulty emptying the bladder, difficulty controlling the flow of urine, or chronic kidney disease Hematological: No reported hematological signs or symptoms such as prolonged bleeding, low or poor functioning platelets, bruising or bleeding easily, hereditary bleeding problems, low energy levels due to low hemoglobin or being anemic Endocrine: No reported endocrine signs or symptoms such as high or low blood  sugar, rapid heart rate due to high thyroid levels, obesity or weight gain due to slow thyroid or thyroid disease Rheumatologic: Joint aches and or swelling due to excess weight (Osteoarthritis) Musculoskeletal: Negative for myasthenia gravis, muscular dystrophy, multiple sclerosis or malignant hyperthermia Work History: Retired  Allergies  Kristen Pennington is allergic to linaclotide.  Laboratory Chemistry Profile   Screening No results found.  Inflammation (CRP: Acute Phase) (ESR: Chronic Phase) No results found.  Rheumatology No results found.  Renal Lab Results  Component Value Date   BUN 22 08/10/2018   CREATININE 1.21 (H) 08/10/2018   BCR 18 08/10/2018   GFRAA 46 (L) 08/10/2018   GFRNONAA 40 (L) 08/10/2018    Hepatic No results found.  Electrolytes Lab Results  Component Value Date   NA 143 08/10/2018   K 5.6 (H) 08/10/2018   CL 101 08/10/2018   CALCIUM 10.3 08/10/2018    Neuropathy Lab Results  Component Value Date   VITAMINB12 761 08/10/2018   FOLATE >20.0 08/10/2018   HGBA1C 5.4 08/01/2018    CNS No results found.  Bone Lab Results  Component Value Date   VD25OH 57.1 08/10/2018    Coagulation Lab Results  Component Value Date   PLT 244 08/10/2018    Cardiovascular Lab Results  Component Value Date   HGB 13.4 08/10/2018   HCT 40.3 08/10/2018    ID No results found.  Cancer No results found.  Endocrine Lab Results  Component Value Date   TSH 2.830 08/10/2018   FREET4 0.94 08/10/2018    Note: Lab results reviewed.  Imaging Review  Lumbosacral Imaging: Lumbar DG (Complete) 4+V:        Results for orders placed during the hospital encounter of 08/11/18  DG Lumbar Spine Complete   Narrative CLINICAL DATA:  Fall from ladder several years ago with persistent low back pain, initial encounter  EXAM: LUMBAR SPINE - COMPLETE 4+ VIEW  COMPARISON:  01/08/2016  FINDINGS: Postsurgical changes are again noted at L4-5. Vertebral body height is  well maintained. No pars defects are noted. Diffuse aortic calcifications are seen. Mild osteophytic changes are noted as well.  IMPRESSION: Stable postsurgical changes at L4-5.  Mild degenerative change without acute abnormality.   Electronically Signed   By: Inez Catalina M.D.   On: 08/11/2018 14:17          Complexity Note: Imaging results reviewed. Results shared with Kristen Pennington, using Layman's terms.                         PFSH  Drug: Kristen Pennington  reports no history of drug use. Alcohol:  reports no history of alcohol use. Tobacco:  reports that she has never smoked. She has never used smokeless tobacco. Medical:  has a past medical history of Arthritis, Atrophic vaginitis (03/06/2013), Atypical migraine (03/06/2013), Avitaminosis D (03/06/2013), Ceratitis (08/01/2013), Colon polyp, Combined fat and  carbohydrate induced hyperlipemia (03/06/2013), Combined pyramidal-extrapyramidal syndrome (03/06/2013), Cystocele, midline (03/06/2013), Demoralization and apathy (03/06/2013), Depression, Environmental allergies, Epistaxis, Essential (primary) hypertension (05/17/2013), GERD (gastroesophageal reflux disease), Hemorrhoid, Inflammation of sacroiliac joint ([place]) (03/06/2013), Sinusitis, and Sleep apnea. Family: family history includes Alzheimer's disease in her father; Arthritis in her mother; Cancer in her brother; Heart disease in her sister.  Past Surgical History:  Procedure Laterality Date  . COLONOSCOPY  05-03-15  . COLONOSCOPY WITH PROPOFOL N/A 05/03/2015   Procedure: COLONOSCOPY WITH PROPOFOL;  Surgeon: Lucilla Lame, MD;  Location: [facility];  Service: Endoscopy;  Laterality: N/A;  CPAP  . DILATION AND CURETTAGE OF UTERUS  1970  . ENDOMETRIAL BIOPSY    . EYE SURGERY Right 2017  . POLYPECTOMY  05/03/2015   Procedure: POLYPECTOMY;  Surgeon: Lucilla Lame, MD;  Location: [facility];  Service: Endoscopy;;  . ROTATOR CUFF REPAIR  2007  . SPINE SURGERY     Active  Ambulatory Problems    Diagnosis Date Noted  . Cystocele, midline 03/06/2013  . Demoralization and apathy 03/06/2013  . Essential (primary) hypertension 05/17/2013  . Ceratitis 08/01/2013  . Atypical migraine 03/06/2013  . Hyperlipidemia 03/06/2013  . Jittery 03/06/2013  . 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 Atrophic vaginitis 03/06/2013  . Inflammation of sacroiliac joint ([place]) 03/06/2013  . Avitaminosis D 03/06/2013  . Blood in stool   . Benign neoplasm of transverse colon   . Benign neoplasm of cecum   . Urinary tract infection without hematuria 08/16/2017  . Generalized anxiety disorder 08/16/2017  . Primary generalized (osteo)arthritis 08/16/2017  . Allergic rhinitis due to pollen 03/06/2013  . Body mass index between 19-24, adult 03/06/2013  . Breast screening 03/06/2013  . Keratitis sicca ([place]) 08/01/2013  . Melena 12/05/2015  . 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 Screening for depression 03/06/2013  . Chronic low back pain (Bilateral) (R>L) w/o sciatica 08/17/2018  . Vitamin B12 deficiency 08/17/2018  . Elevated TSH 08/17/2018  . Gastroesophageal reflux disease without esophagitis 11/27/2018  . Irritable bowel syndrome with constipation 11/27/2018  . MDD (major depressive disorder), recurrent episode, mild ([place]) 01/18/2019  . 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 History of psychosis 01/18/2019  . Delusional disorder ([place]) 03/08/2019  . Dysuria 07/02/2019  . Chronic pain syndrome 08/21/2019  . Pharmacologic therapy 08/21/2019  . Disorder of skeletal system 08/21/2019  . Problems influencing health status 08/21/2019  . History of lumbar spinal fusion 08/21/2019  . Chronic hip pain (Bilateral) (R>L) 08/21/2019  . Lumbar facet joint syndrome (Bilateral) 08/21/2019   Resolved Ambulatory Problems    Diagnosis Date Noted  . Postmenopausal bleeding 09/08/2016   Past Medical History:  Diagnosis Date    . Arthritis   . Colon polyp   . Combined fat and carbohydrate induced hyperlipemia 03/06/2013  . Depression   . Environmental allergies   . Epistaxis   . GERD (gastroesophageal reflux disease)   . Hemorrhoid   . Sinusitis   . Sleep apnea    Assessment  Primary Diagnosis & Pertinent Problem List: The primary encounter diagnosis was Chronic low back pain (Bilateral) (R>L) w/o sciatica. Diagnoses of History of lumbar spinal fusion, Lumbar facet joint syndrome (Bilateral), Chronic hip pain (Bilateral) (R>L), Chronic pain syndrome, Pharmacologic therapy, Disorder of skeletal system, and Problems influencing health status were also pertinent to this visit.  Visit Diagnosis (New problems to examiner): 1. Chronic low back pain (Bilateral) (R>L) w/o sciatica   2. History of lumbar  spinal fusion   3. Lumbar facet joint syndrome (Bilateral)   4. Chronic hip pain (Bilateral) (R>L)   5. Chronic pain syndrome   6. Pharmacologic therapy   7. Disorder of skeletal system   8. Problems influencing health status    Plan of Care (Initial workup plan)  Note: Kristen Pennington was reminded that as per protocol, today's visit has been an evaluation only. We have not taken over the patient's controlled substance management.  Problem-specific plan: No problem-specific Assessment & Plan notes found for this encounter.   Lab Orders     Compliance Drug Analysis, Ur     Comp. Metabolic Panel (12)     Magnesium     Vitamin B12     Sedimentation rate     25-Hydroxy vitamin D Lcms D2+D3     C-reactive protein  Imaging Orders     DG HIP UNILAT W OR W/O PELVIS 2-3 VIEWS RIGHT     DG HIP UNILAT W OR W/O PELVIS 2-3 VIEWS LEFT     DG Lumbar Spine Complete W/Bend Referral Orders  No referral(s) requested today   Procedure Orders    No procedure(s) ordered today   Pharmacotherapy (current): Medications ordered:  No orders of the defined types were placed in this encounter.    Pharmacological  management options:  Opioid Analgesics: The patient was informed that there is no guarantee that she would be a candidate for opioid analgesics. The decision will be made following CDC guidelines. This decision will be based on the results of diagnostic studies, as well as Kristen Pennington's risk profile.   Membrane stabilizer: To be determined at a later time  Muscle relaxant: To be determined at a later time  NSAID: To be determined at a later time  Other analgesic(s): To be determined at a later time   Interventional management options: Kristen Pennington was informed that there is no guarantee that she would be a candidate for interventional therapies. The decision will be based on the results of diagnostic studies, as well as Kristen Pennington's risk profile.  Procedure(s) under consideration:  Diagnostic bilateral lumbar facet block  Diagnostic bilateral SI joint block  Diagnostic bilateral hip joint injection  Diagnostic midline L3-4 LESI  Diagnostic midline caudal ESI    Provider-requested follow-up: Return for (VV), (s/p Tests).  Future Appointments  Date Time Provider [place]  08/22/2019 10:00 AM Ursula Alert, MD [facility] None  10/11/2019 10:00 AM [facility] None  10/17/2019  3:15 PM Allyne Gee, MD [facility] None  10/26/2019  2:15 PM Ronnell Freshwater, NP [facility] None  03/07/2020  3:00 PM Ronnell Freshwater, NP [facility] None   Total duration of encounter: 35 minutes.  Primary Care Physician: Lavera Guise, MD Note by: Gaspar Cola, MD Date: 08/21/2019; Time: 3:59 PM

## 2019-08-17 NOTE — Progress Notes (Signed)
Patient reports that she has a son who is admitted at [facility] and reports that he is critical condition.  He has been there since Christmas day

## 2019-08-21 ENCOUNTER — Telehealth: Payer: Medicare HMO | Admitting: Pain Medicine

## 2019-08-21 ENCOUNTER — Ambulatory Visit: Payer: Medicare HMO | Attending: Pain Medicine | Admitting: Pain Medicine

## 2019-08-21 ENCOUNTER — Other Ambulatory Visit: Payer: Self-pay

## 2019-08-21 VITALS — Ht 64.0 in | Wt 163.0 lb

## 2019-08-21 DIAGNOSIS — M545 Low back pain: Secondary | ICD-10-CM | POA: Diagnosis not present

## 2019-08-21 DIAGNOSIS — G8929 Other chronic pain: Secondary | ICD-10-CM

## 2019-08-21 DIAGNOSIS — Z789 Other specified health status: Secondary | ICD-10-CM | POA: Diagnosis not present

## 2019-08-21 DIAGNOSIS — M899 Disorder of bone, unspecified: Secondary | ICD-10-CM | POA: Diagnosis not present

## 2019-08-21 DIAGNOSIS — M25551 Pain in right hip: Secondary | ICD-10-CM | POA: Diagnosis not present

## 2019-08-21 DIAGNOSIS — G894 Chronic pain syndrome: Secondary | ICD-10-CM | POA: Insufficient documentation

## 2019-08-21 DIAGNOSIS — M47816 Spondylosis without myelopathy or radiculopathy, lumbar region: Secondary | ICD-10-CM

## 2019-08-21 DIAGNOSIS — Z79899 Other long term (current) drug therapy: Secondary | ICD-10-CM | POA: Diagnosis not present

## 2019-08-21 DIAGNOSIS — M25552 Pain in left hip: Secondary | ICD-10-CM | POA: Diagnosis not present

## 2019-08-21 DIAGNOSIS — Z981 Arthrodesis status: Secondary | ICD-10-CM | POA: Diagnosis not present

## 2019-08-22 ENCOUNTER — Ambulatory Visit (INDEPENDENT_AMBULATORY_CARE_PROVIDER_SITE_OTHER): Payer: Medicare HMO | Admitting: Psychiatry

## 2019-08-22 ENCOUNTER — Encounter: Payer: Self-pay | Admitting: Psychiatry

## 2019-08-22 ENCOUNTER — Other Ambulatory Visit: Payer: Self-pay

## 2019-08-22 DIAGNOSIS — F3341 Major depressive disorder, recurrent, in partial remission: Secondary | ICD-10-CM

## 2019-08-22 DIAGNOSIS — F5105 Insomnia due to other mental disorder: Secondary | ICD-10-CM | POA: Diagnosis not present

## 2019-08-22 DIAGNOSIS — F411 Generalized anxiety disorder: Secondary | ICD-10-CM | POA: Diagnosis not present

## 2019-08-22 DIAGNOSIS — F22 Delusional disorders: Secondary | ICD-10-CM

## 2019-08-22 DIAGNOSIS — R69 Illness, unspecified: Secondary | ICD-10-CM | POA: Diagnosis not present

## 2019-08-22 MED ORDER — MIRTAZAPINE 15 MG PO TABS: 15.0000 mg | ORAL_TABLET | Freq: Every day | ORAL | 1 refills | Status: DC

## 2019-08-22 NOTE — Progress Notes (Signed)
Provider Location : [facility] Patient Location : Home   Virtual Visit via Telephone Note  I connected with Kristen Pennington on 08/22/19 at 10:00 AM EST by telephone and verified that I am speaking with the correct person using two identifiers.   I discussed the limitations, risks, security and privacy concerns of performing an evaluation and management service by telephone and the availability of in person appointments. I also discussed with the patient that there may be a patient responsible charge related to this service. The patient expressed understanding and agreed to proceed.    I discussed the assessment and treatment plan with the patient. The patient was provided an opportunity to ask questions and all were answered. The patient agreed with the plan and demonstrated an understanding of the instructions.   The patient was advised to call back or seek an in-person evaluation if the symptoms worsen or if the condition fails to improve as anticipated.  [place] MD OP Progress Note  08/22/2019 12:11 PM Kristen Pennington  MRN:  [id number]  Chief Complaint:  Chief Complaint    Follow-up     HPI: Kristen Pennington is an 86 year old Caucasian female, widowed, lives in [place], has a history of GAD, MDD, delusional disorder, insomnia, hearing loss, vision loss, OSA, hypertension was evaluated by phone today.  Patient preferred to do a phone call.  Patient today reports she is currently anxious about her son who is still admitted to [facility].  He is recovering from COVID-19 infection.  She reports her son is making progress and that is a relief for her.  Patient reports she is currently struggling with sleep issues.  Last night she stayed awake for a long time and then she took 1/2 tablet of mirtazapine again which helped her to fall asleep.  Discussed increasing her mirtazapine to 15 mg.  Patient continues to have trouble with the CPAP mask, encouraged her to get in touch with her provider for  the same.  Patient denies any suicidality, homicidality or perceptual disturbances.  Patient currently does not seem to be preoccupied with any delusions or perceptual disturbances.  Patient denies any other concerns today. Visit Diagnosis:    ICD-10-CM   1. Generalized anxiety disorder  F41.1 mirtazapine (REMERON) 15 MG tablet   stable  2. MDD (major depressive disorder), recurrent, in partial remission (HCC)  F33.41 mirtazapine (REMERON) 15 MG tablet   stable  3. Delusional disorder ([place])  F22   4. Insomnia due to mental condition  F51.05 mirtazapine (REMERON) 15 MG tablet    Past Psychiatric History: I have reviewed past psychiatric history from my progress note on 07/25/2018.  Past trials of Klonopin, Abilify, Lexapro, mirtazapine.  Past Medical History:  Past Medical History:  Diagnosis Date  . Arthritis   . Atrophic vaginitis 03/06/2013   Last Assessment & Plan:  She will plan to continue estradiol vaginal cream.  Prescription was rewritten stipulating use of the generic product.   . Atypical migraine 03/06/2013   Last Assessment & Plan:  Since she rarely takes sumatriptan, we will discontinue it. Continue sparing use of OTC migraine products.   . Avitaminosis D 03/06/2013   Last Assessment & Plan:  Recheck vitamin D level. Plan to continue vitamin D supplementation.   . Ceratitis 08/01/2013   Last Assessment & Plan:  Because of the expense, she will discontinue Restasis. She will use over-the-counter moisturizing eyedrops and liquid gel.   . Colon polyp   . Combined fat and carbohydrate induced  hyperlipemia 03/06/2013   Last Assessment & Plan:  Recheck fasting lipids.   . 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   . Cystocele, midline 03/06/2013  . Demoralization and apathy 03/06/2013   Last Assessment & Plan:  Since she has been taking bupropion only once a day, I will  write the prescription with the correct instructions and correct quantity. She has done well on this for years and she is encouraged to take it regularly   . Depression   . Environmental allergies   . Epistaxis   . Essential (primary) hypertension 05/17/2013   Last Assessment & Plan:  Her blood pressure is well-controlled. We will plan to continue hydrochlorothiazide and benazepril.  Both are generic and should be affordable on her health plan.   Marland Kitchen GERD (gastroesophageal reflux disease)   . Hemorrhoid   . Inflammation of sacroiliac joint ([place]) 03/06/2013   Last Assessment & Plan:  She is doing well on her present regimen of fentanyl patch supplemented with sparing use of tramadol/acetaminophen. We will continue that regimen.   . Sinusitis   . Sleep apnea    CPAP    Past Surgical History:  Procedure Laterality Date  . COLONOSCOPY  05-03-15  . COLONOSCOPY WITH PROPOFOL N/A 05/03/2015   Procedure: COLONOSCOPY WITH PROPOFOL;  Surgeon: Lucilla Lame, MD;  Location: [facility];  Service: Endoscopy;  Laterality: N/A;  CPAP  . DILATION AND CURETTAGE OF UTERUS  1970  . ENDOMETRIAL BIOPSY    . EYE SURGERY Right 2017  . POLYPECTOMY  05/03/2015   Procedure: POLYPECTOMY;  Surgeon: Lucilla Lame, MD;  Location: [facility];  Service: Endoscopy;;  . ROTATOR CUFF REPAIR  2007  . SPINE SURGERY      Family Psychiatric History: I have reviewed family psychiatric history from my progress note on 07/25/2018.  Family History:  Family History  Problem Relation Age of Onset  . Arthritis Mother   . Alzheimer's disease Father   . Heart disease Sister   . Cancer Brother   . Ovarian cancer Neg Hx   . Breast cancer Neg Hx   . Colon cancer Neg Hx   . Diabetes Neg Hx     Social History: Reviewed social history from my progress note on 07/25/2018. Social History   Socioeconomic History  . Marital status: Widowed    Spouse name: Not on file  . Number of children: 2  . Years of education:  Not on file  . Highest education level: Some college, no degree  Occupational History  . Not on file  Tobacco Use  . Smoking status: Never Smoker  . Smokeless tobacco: Never Used  Substance and Sexual Activity  . Alcohol use: No    Alcohol/week: 0.0 standard drinks  . Drug use: No  . Sexual activity: Not Currently  Other Topics Concern  . Not on file  Social History Narrative  . Not on file   Social Determinants of Health   Financial Resource Strain:   . Difficulty of Paying Living Expenses: Not on file  Food Insecurity:   . Worried About Charity fundraiser in the Last Year: Not on file  . Ran Out of Food in the Last Year: Not on file  Transportation Needs:   . Lack of Transportation (Medical): Not on file  . Lack of Transportation (Non-Medical): Not on file  Physical Activity:   .  Days of Exercise per Week: Not on file  . Minutes of Exercise per Session: Not on file  Stress:   . Feeling of Stress : Not on file  Social Connections:   . Frequency of Communication with Friends and Family: Not on file  . Frequency of Social Gatherings with Friends and Family: Not on file  . Attends Religious Services: Not on file  . Active Member of Clubs or Organizations: Not on file  . Attends Archivist Meetings: Not on file  . Marital Status: Not on file    Allergies:  Allergies  Allergen Reactions  . Linaclotide Swelling    Metabolic Disorder Labs: Lab Results  Component Value Date   HGBA1C 5.4 08/01/2018   Lab Results  Component Value Date   PROLACTIN 8.1 08/01/2018   Lab Results  Component Value Date   CHOL 245 (H) 08/01/2018   TRIG 187 (H) 08/01/2018   HDL 49 08/01/2018   LDLCALC 159 (H) 08/01/2018   Lab Results  Component Value Date   TSH 2.830 08/10/2018   TSH 5.550 (H) 08/01/2018    Therapeutic Level Labs: No results found for: LITHIUM No results found for: VALPROATE No components found for:  CBMZ  Current Medications: Current Outpatient  Medications  Medication Sig Dispense Refill  . acetaminophen (TYLENOL) 500 MG tablet Take 500 mg by mouth every 6 (six) hours as needed.    . bisoprolol-hydrochlorothiazide (ZIAC) 5-6.25 MG tablet Take 1 tablet by mouth daily. 30 tablet 2  . Calcium Carb-Cholecalciferol 600-800 MG-UNIT TABS Take by mouth.    . Carboxymethylcellulose Sodium (THERATEARS OP) Apply to eye.    . Cholecalciferol (VITAMIN D3) 5000 units TABS Take 5,000 Units by mouth daily.    Marland Kitchen dicyclomine (BENTYL) 10 MG capsule Take 10 mg by mouth 4 (four) times daily -  before meals and at bedtime.    Marland Kitchen escitalopram (LEXAPRO) 20 MG tablet Take 1 tablet (20 mg total) by mouth daily. 90 tablet 0  . estradiol (ESTRACE) 0.1 MG/GM vaginal cream Use once a week as needed 42.5 g 1  . fluticasone (FLONASE) 50 MCG/ACT nasal spray 1 SPRAY IN EACH NOSTRIL ONCE ADAY AS NEEDED 16 g 1  . ibuprofen (ADVIL,MOTRIN) 200 MG tablet Take 200 mg by mouth every 6 (six) hours as needed.    . Liniments (SALONPAS PAIN RELIEF PATCH EX) Apply 1 patch topically 2 (two) times daily.    Marland Kitchen lubiprostone (AMITIZA) 8 MCG capsule Take 1 capsule po bid prn ibs constipation. 60 capsule 3  . meloxicam (MOBIC) 7.5 MG tablet Take 7.5 mg by mouth daily.    . mirtazapine (REMERON) 15 MG tablet Take 1 tablet (15 mg total) by mouth at bedtime. For sleep 30 tablet 1  . Multiple Vitamin (MULTI-VITAMIN) tablet Take by mouth.    . Multiple Vitamins-Minerals (CENTRUM SILVER 50+WOMEN) TABS Take by mouth.    . oxybutynin (DITROPAN-XL) 10 MG 24 hr tablet Take 1 tablet (10 mg total) by mouth daily. 30 tablet 3  . pantoprazole (PROTONIX) 40 MG tablet Take 1 tablet (40 mg total) by mouth daily. 90 tablet 1  . promethazine (PHENERGAN) 25 MG tablet Take 25 mg by mouth as needed.     Marland Kitchen rOPINIRole (REQUIP) 1 MG tablet TAKE ONE TABLET 3 TIMES DAILY AS NEEDED FOR RESTLESS LEGS 90 tablet 1  . simethicone (MYLICON) 80 MG chewable tablet Chew by mouth.     No current facility-administered  medications for this visit.  Musculoskeletal: Strength & Muscle Tone: UTA Gait & Station: Reports as WNL Patient leans: N/A  Psychiatric Specialty Exam: Review of Systems  Psychiatric/Behavioral: Positive for sleep disturbance. The patient is nervous/anxious.   All other systems reviewed and are negative.   There were no vitals taken for this visit.There is no height or weight on file to calculate BMI.  General Appearance: UTA  Eye Contact:  UTA  Speech:  Clear and Coherent  Volume:  Normal  Mood:  Anxious  Affect:  UTA  Thought Process:  Goal Directed and Descriptions of Associations: Intact  Orientation:  Full (Time, Place, and Person)  Thought Content: Logical   Suicidal Thoughts:  No  Homicidal Thoughts:  No  Memory:  Immediate;   Fair Recent;   Fair Remote;   Fair  Judgement:  Fair  Insight:  Fair  Psychomotor Activity:  UTA  Concentration:  Concentration: Fair and Attention Span: Fair  Recall:  [employer] of Knowledge: Fair  Language: Fair  Akathisia:  No  Handed:  Right  AIMS (if indicated): Denies tremors, rigidity  Assets:  Communication Skills Desire for Improvement Housing Social Support  ADL's:  Intact  Cognition: WNL  Sleep:  Poor   Screenings: Mini-Mental     Clinical Support from 03/06/2019 in [facility], [place] from 03/02/2018 in [facility], [facility]  Total Score (max 30 points )  30  27    PHQ2-9     Clinical Support from 03/06/2019 in [facility], [facility] Office Visit from 11/08/2018 in [facility], [facility] Office Visit from 08/08/2018 in [facility] Office Visit from 06/30/2018 in [facility], [facility] Office Visit from 05/02/2018 in [facility], [facility]  PHQ-2 Total Score  0  0  1  0  0       Assessment and Plan: Kristen Pennington is a 86 year old female who has a history of GAD, MDD, history of psychosis, essential hypertension, chronic pain, OSA, vision loss,  hearing loss was evaluated by phone today.  Patient with psychosocial stressors of the current pandemic as well as her own health problems as well as her son who is currently struggling with COVID-19 infection.  Patient currently struggles with sleep issues and will benefit from medication readjustment.  Plan as noted below.  Plan GAD-stable Lexapro 20 mg p.o. daily  MDD-stable Lexapro as prescribed  Delusional disorder-stable Patient is currently not on Abilify.  She does not seem to be preoccupied with any delusions.  Insomnia-restless Increase mirtazapine to 15 mg p.o. nightly  Follow-up in clinic in 4 weeks or sooner if needed.  March 9 at 10:30 AM  I have spent atleast 20 minutes non face to face with patient today. More than 50 % of the time was spent for ordering medications and test ,psychoeducation and supportive psychotherapy and care coordination,as well as documenting clinical information in electronic health record. This note was generated in part or whole with voice recognition software. Voice recognition is usually quite accurate but there are transcription errors that can and very often do occur. I apologize for any typographical errors that were not detected and corrected.        Ursula Alert, MD 08/22/2019, 12:11 PM

## 2019-08-23 DIAGNOSIS — L820 Inflamed seborrheic keratosis: Secondary | ICD-10-CM | POA: Diagnosis not present

## 2019-08-23 DIAGNOSIS — C44321 Squamous cell carcinoma of skin of nose: Secondary | ICD-10-CM | POA: Diagnosis not present

## 2019-08-23 DIAGNOSIS — L570 Actinic keratosis: Secondary | ICD-10-CM | POA: Diagnosis not present

## 2019-08-23 DIAGNOSIS — X32XXXA Exposure to sunlight, initial encounter: Secondary | ICD-10-CM | POA: Diagnosis not present

## 2019-08-23 DIAGNOSIS — D485 Neoplasm of uncertain behavior of skin: Secondary | ICD-10-CM | POA: Diagnosis not present

## 2019-08-28 ENCOUNTER — Other Ambulatory Visit: Payer: Self-pay | Admitting: Psychiatry

## 2019-08-28 ENCOUNTER — Other Ambulatory Visit: Payer: Self-pay

## 2019-08-28 DIAGNOSIS — I10 Essential (primary) hypertension: Secondary | ICD-10-CM

## 2019-08-28 DIAGNOSIS — F411 Generalized anxiety disorder: Secondary | ICD-10-CM

## 2019-08-28 MED ORDER — BISOPROLOL-HYDROCHLOROTHIAZIDE 5-6.25 MG PO TABS: 1.0000 | ORAL_TABLET | Freq: Every day | ORAL | 3 refills | Status: DC

## 2019-08-28 MED ORDER — OXYBUTYNIN CHLORIDE ER 10 MG PO TB24: 10.0000 mg | ORAL_TABLET | Freq: Every day | ORAL | 3 refills | Status: DC

## 2019-08-28 MED ORDER — ROPINIROLE HCL 1 MG PO TABS: ORAL_TABLET | ORAL | 3 refills | Status: DC

## 2019-08-28 MED ORDER — FLUTICASONE PROPIONATE 50 MCG/ACT NA SUSP: NASAL | 1 refills | Status: DC

## 2019-09-04 ENCOUNTER — Telehealth: Payer: Self-pay | Admitting: Pain Medicine

## 2019-09-04 NOTE — Telephone Encounter (Signed)
Patient called stating she is unable to come in right now for labs and xrays. Her son passed away Nov 10, 2022 from [place]. She will call when she is able to come in.

## 2019-09-19 ENCOUNTER — Other Ambulatory Visit: Payer: Self-pay

## 2019-09-19 ENCOUNTER — Ambulatory Visit (INDEPENDENT_AMBULATORY_CARE_PROVIDER_SITE_OTHER): Payer: Medicare HMO | Admitting: Psychiatry

## 2019-09-19 ENCOUNTER — Encounter: Payer: Self-pay | Admitting: Psychiatry

## 2019-09-19 DIAGNOSIS — F411 Generalized anxiety disorder: Secondary | ICD-10-CM

## 2019-09-19 DIAGNOSIS — F3341 Major depressive disorder, recurrent, in partial remission: Secondary | ICD-10-CM | POA: Insufficient documentation

## 2019-09-19 DIAGNOSIS — F22 Delusional disorders: Secondary | ICD-10-CM

## 2019-09-19 DIAGNOSIS — R69 Illness, unspecified: Secondary | ICD-10-CM | POA: Diagnosis not present

## 2019-09-19 DIAGNOSIS — F5105 Insomnia due to other mental disorder: Secondary | ICD-10-CM

## 2019-09-19 DIAGNOSIS — Z634 Disappearance and death of family member: Secondary | ICD-10-CM | POA: Insufficient documentation

## 2019-09-19 MED ORDER — MIRTAZAPINE 15 MG PO TABS: 15.0000 mg | ORAL_TABLET | Freq: Every day | ORAL | 1 refills | Status: DC

## 2019-09-19 NOTE — Progress Notes (Signed)
Provider Location : [facility] Patient Location : Home  Virtual Visit via Telephone Note  I connected with Kristen Pennington on 09/19/19 at 10:30 AM EST by telephone and verified that I am speaking with the correct person using two identifiers.   I discussed the limitations, risks, security and privacy concerns of performing an evaluation and management service by telephone and the availability of in person appointments. I also discussed with the patient that there may be a patient responsible charge related to this service. The patient expressed understanding and agreed to proceed.   I discussed the assessment and treatment plan with the patient. The patient was provided an opportunity to ask questions and all were answered. The patient agreed with the plan and demonstrated an understanding of the instructions.   The patient was advised to call back or seek an in-person evaluation if the symptoms worsen or if the condition fails to improve as anticipated.   [facility] MD OP Progress Note  09/19/2019 11:59 AM Kristen Pennington  MRN:  [id number]  Chief Complaint:  Chief Complaint    Follow-up     HPI: Kristen Pennington is an 86 year old Caucasian female, widowed, lives in [place], has a history of GAD, MDD, delusional disorder, insomnia, hearing loss, vision loss, OSA, hypertension was evaluated by phone today.  Patient preferred to do a phone call.  Patient today reports she is currently grieving the loss of her son Kyung Rudd who passed away on 09-11-2022 th due to [place].  He was admitted to [facility]  Patient reports even though she is going through this loss, so far she has been coping okay.  Patient reports she does not feel as depressed as she used to before.  She believes the medications as effective.  She reports sleep as good on the Remeron.  She reports she has a lot of support system from other residents at the senior living community where she lives at.  She also has a lot of support  from her daughter Kristen Pennington.  Patient denies any suicidality, homicidality or perceptual disturbances.  Patient denies any other concerns today. Visit Diagnosis:    ICD-10-CM   1. Generalized anxiety disorder  F41.1 mirtazapine (REMERON) 15 MG tablet   stable  2. MDD (major depressive disorder), recurrent, in partial remission (HCC)  F33.41 mirtazapine (REMERON) 15 MG tablet   stable  3. Delusional disorder ([place])  F22   4. Insomnia due to mental condition  F51.05 mirtazapine (REMERON) 15 MG tablet  5. Bereavement  Z63.4     Past Psychiatric History: I have reviewed past psychiatric history from my progress note on 07/25/2018.  Past trials of Klonopin, Abilify, Lexapro, mirtazapine.  Past Medical History:  Past Medical History:  Diagnosis Date  . Arthritis   . Atrophic vaginitis 03/06/2013   Last Assessment & Plan:  She will plan to continue estradiol vaginal cream.  Prescription was rewritten stipulating use of the generic product.   . Atypical migraine 03/06/2013   Last Assessment & Plan:  Since she rarely takes sumatriptan, we will discontinue it. Continue sparing use of OTC migraine products.   . Avitaminosis D 03/06/2013   Last Assessment & Plan:  Recheck vitamin D level. Plan to continue vitamin D supplementation.   . Ceratitis 08/01/2013   Last Assessment & Plan:  Because of the expense, she will discontinue Restasis. She will use over-the-counter moisturizing eyedrops and liquid gel.   . Colon polyp   . Combined fat and carbohydrate induced hyperlipemia 03/06/2013  Last Assessment & Plan:  Recheck fasting lipids.   . 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   . Cystocele, midline 03/06/2013  . Demoralization and apathy 03/06/2013   Last Assessment & Plan:  Since she has been taking bupropion only once a day, I will write the prescription with the correct instructions and  correct quantity. She has done well on this for years and she is encouraged to take it regularly   . Depression   . Environmental allergies   . Epistaxis   . Essential (primary) hypertension 05/17/2013   Last Assessment & Plan:  Her blood pressure is well-controlled. We will plan to continue hydrochlorothiazide and benazepril.  Both are generic and should be affordable on her health plan.   Marland Kitchen GERD (gastroesophageal reflux disease)   . Hemorrhoid   . Inflammation of sacroiliac joint ([place]) 03/06/2013   Last Assessment & Plan:  She is doing well on her present regimen of fentanyl patch supplemented with sparing use of tramadol/acetaminophen. We will continue that regimen.   . Sinusitis   . Sleep apnea    CPAP    Past Surgical History:  Procedure Laterality Date  . COLONOSCOPY  05-03-15  . COLONOSCOPY WITH PROPOFOL N/A 05/03/2015   Procedure: COLONOSCOPY WITH PROPOFOL;  Surgeon: Lucilla Lame, MD;  Location: [facility];  Service: Endoscopy;  Laterality: N/A;  CPAP  . DILATION AND CURETTAGE OF UTERUS  1970  . ENDOMETRIAL BIOPSY    . EYE SURGERY Right 2017  . POLYPECTOMY  05/03/2015   Procedure: POLYPECTOMY;  Surgeon: Lucilla Lame, MD;  Location: [facility];  Service: Endoscopy;;  . ROTATOR CUFF REPAIR  2007  . SPINE SURGERY      Family Psychiatric History: I have reviewed family psychiatric history from my progress note on 07/25/2018  Family History:  Family History  Problem Relation Age of Onset  . Arthritis Mother   . Alzheimer's disease Father   . Heart disease Sister   . Cancer Brother   . Ovarian cancer Neg Hx   . Breast cancer Neg Hx   . Colon cancer Neg Hx   . Diabetes Neg Hx     Social History: I have reviewed social history from my progress note on 07/25/2018 Social History   Socioeconomic History  . Marital status: Widowed    Spouse name: Not on file  . Number of children: 2  . Years of education: Not on file  . Highest education level: Some college,  no degree  Occupational History  . Not on file  Tobacco Use  . Smoking status: Never Smoker  . Smokeless tobacco: Never Used  Substance and Sexual Activity  . Alcohol use: No    Alcohol/week: 0.0 standard drinks  . Drug use: No  . Sexual activity: Not Currently  Other Topics Concern  . Not on file  Social History Narrative  . Not on file   Social Determinants of Health   Financial Resource Strain:   . Difficulty of Paying Living Expenses: Not on file  Food Insecurity:   . Worried About Charity fundraiser in the Last Year: Not on file  . Ran Out of Food in the Last Year: Not on file  Transportation Needs:   . Lack of Transportation (Medical): Not on file  . Lack of Transportation (Non-Medical): Not on file  Physical Activity:   . Days  of Exercise per Week: Not on file  . Minutes of Exercise per Session: Not on file  Stress:   . Feeling of Stress : Not on file  Social Connections:   . Frequency of Communication with Friends and Family: Not on file  . Frequency of Social Gatherings with Friends and Family: Not on file  . Attends Religious Services: Not on file  . Active Member of Clubs or Organizations: Not on file  . Attends Archivist Meetings: Not on file  . Marital Status: Not on file    Allergies:  Allergies  Allergen Reactions  . Linaclotide Swelling    Metabolic Disorder Labs: Lab Results  Component Value Date   HGBA1C 5.4 08/01/2018   Lab Results  Component Value Date   PROLACTIN 8.1 08/01/2018   Lab Results  Component Value Date   CHOL 245 (H) 08/01/2018   TRIG 187 (H) 08/01/2018   HDL 49 08/01/2018   LDLCALC 159 (H) 08/01/2018   Lab Results  Component Value Date   TSH 2.830 08/10/2018   TSH 5.550 (H) 08/01/2018    Therapeutic Level Labs: No results found for: LITHIUM No results found for: VALPROATE No components found for:  CBMZ  Current Medications: Current Outpatient Medications  Medication Sig Dispense Refill  .  acetaminophen (TYLENOL) 500 MG tablet Take 500 mg by mouth every 6 (six) hours as needed.    . bisoprolol-hydrochlorothiazide (ZIAC) 5-6.25 MG tablet Take 1 tablet by mouth daily. 30 tablet 3  . Calcium Carb-Cholecalciferol 600-800 MG-UNIT TABS Take by mouth.    . Carboxymethylcellulose Sodium (THERATEARS OP) Apply to eye.    . Cholecalciferol (VITAMIN D3) 5000 units TABS Take 5,000 Units by mouth daily.    Marland Kitchen desonide (DESOWEN) 0.05 % ointment APPLY TWICE DAILY TO FACE AS DIRECTED    . dicyclomine (BENTYL) 10 MG capsule Take 10 mg by mouth 4 (four) times daily -  before meals and at bedtime.    Marland Kitchen escitalopram (LEXAPRO) 20 MG tablet Take 1 tablet (20 mg total) by mouth daily. 90 tablet 0  . estradiol (ESTRACE) 0.1 MG/GM vaginal cream Use once a week as needed 42.5 g 1  . fluticasone (FLONASE) 50 MCG/ACT nasal spray 1 SPRAY IN EACH NOSTRIL ONCE ADAY AS NEEDED 16 g 1  . ibuprofen (ADVIL,MOTRIN) 200 MG tablet Take 200 mg by mouth every 6 (six) hours as needed.    . Liniments (SALONPAS PAIN RELIEF PATCH EX) Apply 1 patch topically 2 (two) times daily.    Marland Kitchen lubiprostone (AMITIZA) 8 MCG capsule Take 1 capsule po bid prn ibs constipation. 60 capsule 3  . meloxicam (MOBIC) 7.5 MG tablet Take 7.5 mg by mouth daily.    . mirtazapine (REMERON) 15 MG tablet Take 1 tablet (15 mg total) by mouth at bedtime. For sleep 30 tablet 1  . Multiple Vitamin (MULTI-VITAMIN) tablet Take by mouth.    . Multiple Vitamins-Minerals (CENTRUM SILVER 50+WOMEN) TABS Take by mouth.    . oxybutynin (DITROPAN-XL) 10 MG 24 hr tablet Take 1 tablet (10 mg total) by mouth daily. 30 tablet 3  . pantoprazole (PROTONIX) 40 MG tablet Take 1 tablet (40 mg total) by mouth daily. 90 tablet 1  . promethazine (PHENERGAN) 25 MG tablet Take 25 mg by mouth as needed.     Marland Kitchen rOPINIRole (REQUIP) 1 MG tablet TAKE ONE TABLET 3 TIMES DAILY AS NEEDED FOR RESTLESS LEGS 90 tablet 3  . simethicone (MYLICON) 80 MG chewable tablet Chew by mouth.  No  current facility-administered medications for this visit.     Musculoskeletal: Strength & Muscle Tone: UTA Gait & Station: Reports as WNL Patient leans: N/A  Psychiatric Specialty Exam: Review of Systems  Psychiatric/Behavioral: Positive for dysphoric mood.  All other systems reviewed and are negative.   There were no vitals taken for this visit.There is no height or weight on file to calculate BMI.  General Appearance: UTA  Eye Contact:  UTA  Speech:  Clear and Coherent  Volume:  Normal  Mood:  Dysphoricimproving  Affect:  UTA  Thought Process:  Goal Directed and Descriptions of Associations: Intact  Orientation:  Full (Time, Place, and Person)  Thought Content: Logical   Suicidal Thoughts:  No  Homicidal Thoughts:  No  Memory:  Immediate;   Fair Recent;   Fair Remote;   Fair  Judgement:  Fair  Insight:  Fair  Psychomotor Activity:  UTA  Concentration:  Concentration: Fair and Attention Span: Fair  Recall:  [employer] of Knowledge: Fair  Language: Fair  Akathisia:  No  Handed:  Right  AIMS (if indicated): UTA  Assets:  Communication Skills Desire for Improvement Housing Social Support  ADL's:  Intact  Cognition: WNL  Sleep:  Fair   Screenings: Mini-Mental     Clinical Support from 03/06/2019 in [facility], [place] from 03/02/2018 in [facility], [facility]  Total Score (max 30 points )  30  27    PHQ2-9     Clinical Support from 03/06/2019 in [facility], [facility] Office Visit from 11/08/2018 in [facility], [facility] Office Visit from 08/08/2018 in [facility], [facility] Office Visit from 06/30/2018 in [facility], [facility] Office Visit from 05/02/2018 in [facility], [facility]  PHQ-2 Total Score  0  0  1  0  0       Assessment and Plan: Shamonique is an 86 year old female who has a history of GAD, MDD, history of psychosis, essential hypertension, chronic pain, OSA,  hearing loss was evaluated by  phone today.  Patient with psychosocial stressors of the death of her son recently due to COVID-52.  Patient however reports she is coping okay.  Plan as noted below.  Plan GAD-stable Lexapro 20 mg p.o. daily.  MDD-stable Lexapro as prescribed  Delusional disorder-stable We will continue to monitor closely.  She does not seem to be preoccupied with any delusions today.  Insomnia-stable Mirtazapine 15 mg p.o. nightly  Bereavement-provided grief counseling.  Offered referral to psychotherapy sessions however she declines.  She reports she has a lot of good support system.  Follow-up in clinic in 4 weeks or sooner if needed.  I have spent atleast 20 minutes non face to face with patient today. More than 50 % of the time was spent for ordering medications and test ,psychoeducation and supportive psychotherapy and care coordination,as well as documenting clinical information in electronic health record. This note was generated in part or whole with voice recognition software. Voice recognition is usually quite accurate but there are transcription errors that can and very often do occur. I apologize for any typographical errors that were not detected and corrected.       Ursula Alert, MD 09/19/2019, 11:59 AM

## 2019-09-29 ENCOUNTER — Other Ambulatory Visit: Payer: Self-pay | Admitting: Psychiatry

## 2019-09-29 DIAGNOSIS — F411 Generalized anxiety disorder: Secondary | ICD-10-CM

## 2019-10-06 DIAGNOSIS — L718 Other rosacea: Secondary | ICD-10-CM | POA: Diagnosis not present

## 2019-10-09 ENCOUNTER — Telehealth: Payer: Self-pay

## 2019-10-09 NOTE — Telephone Encounter (Signed)
Left message confirming pt cpap download appt

## 2019-10-11 ENCOUNTER — Ambulatory Visit: Payer: Medicare HMO

## 2019-10-11 ENCOUNTER — Telehealth: Payer: Self-pay | Admitting: Internal Medicine

## 2019-10-11 NOTE — Telephone Encounter (Signed)
Due to patient's non compliant readings with cpap machine her insurance has stopped paying for it and DME company [facility] will pick up cpap and if pt wants to restart cpap she will need to restart process and redo sleep studies. beth

## 2019-10-12 ENCOUNTER — Telehealth: Payer: Self-pay

## 2019-10-12 NOTE — Telephone Encounter (Signed)
Confirmed appointment on 10/17/2019 and screened for covid. [facility] 

## 2019-10-16 ENCOUNTER — Other Ambulatory Visit: Payer: Self-pay

## 2019-10-16 DIAGNOSIS — I10 Essential (primary) hypertension: Secondary | ICD-10-CM

## 2019-10-16 MED ORDER — BISOPROLOL-HYDROCHLOROTHIAZIDE 5-6.25 MG PO TABS: 1.0000 | ORAL_TABLET | Freq: Every day | ORAL | 3 refills | Status: DC

## 2019-10-16 MED ORDER — OXYBUTYNIN CHLORIDE ER 10 MG PO TB24: 10.0000 mg | ORAL_TABLET | Freq: Every day | ORAL | 3 refills | Status: DC

## 2019-10-16 MED ORDER — ROPINIROLE HCL 1 MG PO TABS: ORAL_TABLET | ORAL | 3 refills | Status: DC

## 2019-10-17 ENCOUNTER — Ambulatory Visit (INDEPENDENT_AMBULATORY_CARE_PROVIDER_SITE_OTHER): Payer: Medicare HMO | Admitting: Psychiatry

## 2019-10-17 ENCOUNTER — Encounter: Payer: Self-pay | Admitting: Psychiatry

## 2019-10-17 ENCOUNTER — Ambulatory Visit (INDEPENDENT_AMBULATORY_CARE_PROVIDER_SITE_OTHER): Payer: Medicare HMO | Admitting: Internal Medicine

## 2019-10-17 ENCOUNTER — Encounter: Payer: Self-pay | Admitting: Internal Medicine

## 2019-10-17 ENCOUNTER — Other Ambulatory Visit: Payer: Self-pay

## 2019-10-17 VITALS — BP 128/72 | HR 62 | Resp 16 | Ht 65.0 in | Wt 160.0 lb

## 2019-10-17 DIAGNOSIS — F411 Generalized anxiety disorder: Secondary | ICD-10-CM

## 2019-10-17 DIAGNOSIS — Z634 Disappearance and death of family member: Secondary | ICD-10-CM

## 2019-10-17 DIAGNOSIS — F3341 Major depressive disorder, recurrent, in partial remission: Secondary | ICD-10-CM

## 2019-10-17 DIAGNOSIS — G4733 Obstructive sleep apnea (adult) (pediatric): Secondary | ICD-10-CM

## 2019-10-17 DIAGNOSIS — F5105 Insomnia due to other mental disorder: Secondary | ICD-10-CM

## 2019-10-17 DIAGNOSIS — F22 Delusional disorders: Secondary | ICD-10-CM | POA: Diagnosis not present

## 2019-10-17 DIAGNOSIS — R69 Illness, unspecified: Secondary | ICD-10-CM | POA: Diagnosis not present

## 2019-10-17 DIAGNOSIS — K219 Gastro-esophageal reflux disease without esophagitis: Secondary | ICD-10-CM

## 2019-10-17 MED ORDER — ESCITALOPRAM OXALATE 20 MG PO TABS: 20.0000 mg | ORAL_TABLET | Freq: Every day | ORAL | 0 refills | Status: DC

## 2019-10-17 NOTE — Progress Notes (Signed)
Provider Location : [facility] Patient Location : Home  Virtual Visit via Telephone Note  I connected with Kristen Pennington on 10/17/19 at 11:30 AM EDT by telephone and verified that I am speaking with the correct person using two identifiers.   I discussed the limitations, risks, security and privacy concerns of performing an evaluation and management service by telephone and the availability of in person appointments. I also discussed with the patient that there may be a patient responsible charge related to this service. The patient expressed understanding and agreed to proceed.     I discussed the assessment and treatment plan with the patient. The patient was provided an opportunity to ask questions and all were answered. The patient agreed with the plan and demonstrated an understanding of the instructions.   The patient was advised to call back or seek an in-person evaluation if the symptoms worsen or if the condition fails to improve as anticipated.   [facility] MD OP Progress Note  10/17/2019 11:50 AM Kristen Pennington  MRN:  [id number]  Chief Complaint:  Chief Complaint    Follow-up     HPI: Kristen Pennington is an 86 year old Caucasian female, widowed, lives in [place], has a history of GAD, MDD, delusional disorder, insomnia, hearing loss, vision loss, OSA, hypertension was evaluated by phone today.  Patient preferred to do a phone call.  Patient today reports she continues to grieve the loss of her son Kyung Rudd however she is coping better.  She has good support system from her friends and family.  She is compliant on Lexapro and Remeron.  She uses the Remeron only as needed which helps her to sleep.  She is also using CPAP.  She reports she has a skin lesion and needs possible biopsy.  She will continue to follow-up with her providers for the same.  Patient denies any suicidality, homicidality or perceptual disturbances.  Patient denies any other concerns today. Visit Diagnosis:     ICD-10-CM   1. Generalized anxiety disorder  F41.1 escitalopram (LEXAPRO) 20 MG tablet  2. MDD (major depressive disorder), recurrent, in partial remission ([place])  F33.41   3. Delusional disorder ([place])  F22    remission  4. Insomnia due to mental condition  F51.05   5. Bereavement  Z63.4     Past Psychiatric History: I have reviewed past psychiatric history from my progress note on 07/25/2018.  Past trials of Klonopin, Abilify, Lexapro, mirtazapine  Past Medical History:  Past Medical History:  Diagnosis Date  . Arthritis   . Atrophic vaginitis 03/06/2013   Last Assessment & Plan:  She will plan to continue estradiol vaginal cream.  Prescription was rewritten stipulating use of the generic product.   . Atypical migraine 03/06/2013   Last Assessment & Plan:  Since she rarely takes sumatriptan, we will discontinue it. Continue sparing use of OTC migraine products.   . Avitaminosis D 03/06/2013   Last Assessment & Plan:  Recheck vitamin D level. Plan to continue vitamin D supplementation.   . Ceratitis 08/01/2013   Last Assessment & Plan:  Because of the expense, she will discontinue Restasis. She will use over-the-counter moisturizing eyedrops and liquid gel.   . Colon polyp   . 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   . 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   . Cystocele,  midline 03/06/2013  . Demoralization and apathy 03/06/2013   Last Assessment & Plan:  Since she has been taking bupropion only once a day, I will write the prescription with the correct instructions and correct quantity. She has done well on this for years and she is encouraged to take it regularly   . Depression   . Environmental allergies   . Epistaxis   . Essential (primary) hypertension 05/17/2013   Last Assessment & Plan:  Her blood  pressure is well-controlled. We will plan to continue hydrochlorothiazide and benazepril.  Both are generic and should be affordable on her health plan.   Marland Kitchen GERD (gastroesophageal reflux disease)   . Hemorrhoid   . Inflammation of sacroiliac joint ([place]) 03/06/2013   Last Assessment & Plan:  She is doing well on her present regimen of fentanyl patch supplemented with sparing use of tramadol/acetaminophen. We will continue that regimen.   . Sinusitis   . Sleep apnea    CPAP    Past Surgical History:  Procedure Laterality Date  . COLONOSCOPY  05-03-15  . COLONOSCOPY WITH PROPOFOL N/A 05/03/2015   Procedure: COLONOSCOPY WITH PROPOFOL;  Surgeon: Lucilla Lame, MD;  Location: [facility];  Service: Endoscopy;  Laterality: N/A;  CPAP  . DILATION AND CURETTAGE OF UTERUS  1970  . ENDOMETRIAL BIOPSY    . EYE SURGERY Right 2017  . POLYPECTOMY  05/03/2015   Procedure: POLYPECTOMY;  Surgeon: Lucilla Lame, MD;  Location: [facility];  Service: Endoscopy;;  . ROTATOR CUFF REPAIR  2007  . SPINE SURGERY      Family Psychiatric History: I have reviewed family psychiatric history from my progress note on 07/25/2018  Family History:  Family History  Problem Relation Age of Onset  . Arthritis Mother   . Alzheimer's disease Father   . Heart disease Sister   . Cancer Brother   . Ovarian cancer Neg Hx   . Breast cancer Neg Hx   . Colon cancer Neg Hx   . Diabetes Neg Hx     Social History: I have reviewed social history from my progress note on 07/25/2018 Social History   Socioeconomic History  . Marital status: Widowed    Spouse name: Not on file  . Number of children: 2  . Years of education: Not on file  . Highest education level: Some college, no degree  Occupational History  . Not on file  Tobacco Use  . Smoking status: Never Smoker  . Smokeless tobacco: Never Used  Substance and Sexual Activity  . Alcohol use: No    Alcohol/week: 0.0 standard drinks  . Drug use: No  .  Sexual activity: Not Currently  Other Topics Concern  . Not on file  Social History Narrative  . Not on file   Social Determinants of Health   Financial Resource Strain:   . Difficulty of Paying Living Expenses:   Food Insecurity:   . Worried About Charity fundraiser in the Last Year:   . Arboriculturist in the Last Year:   Transportation Needs:   . Film/video editor (Medical):   Marland Kitchen Lack of Transportation (Non-Medical):   Physical Activity:   . Days of Exercise per Week:   . Minutes of Exercise per Session:   Stress:   . Feeling of Stress :   Social Connections:   . Frequency of Communication with Friends and Family:   . Frequency of Social Gatherings with Friends and Family:   .  Attends Religious Services:   . Active Member of Clubs or Organizations:   . Attends Archivist Meetings:   Marland Kitchen Marital Status:     Allergies:  Allergies  Allergen Reactions  . Linaclotide Swelling    Metabolic Disorder Labs: Lab Results  Component Value Date   HGBA1C 5.4 08/01/2018   Lab Results  Component Value Date   PROLACTIN 8.1 08/01/2018   Lab Results  Component Value Date   CHOL 245 (H) 08/01/2018   TRIG 187 (H) 08/01/2018   HDL 49 08/01/2018   LDLCALC 159 (H) 08/01/2018   Lab Results  Component Value Date   TSH 2.830 08/10/2018   TSH 5.550 (H) 08/01/2018    Therapeutic Level Labs: No results found for: LITHIUM No results found for: VALPROATE No components found for:  CBMZ  Current Medications: Current Outpatient Medications  Medication Sig Dispense Refill  . acetaminophen (TYLENOL) 500 MG tablet Take 500 mg by mouth every 6 (six) hours as needed.    . bisoprolol-hydrochlorothiazide (ZIAC) 5-6.25 MG tablet Take 1 tablet by mouth daily. 30 tablet 3  . Calcium Carb-Cholecalciferol 600-800 MG-UNIT TABS Take by mouth.    . Carboxymethylcellulose Sodium (THERATEARS OP) Apply to eye.    . Cholecalciferol (VITAMIN D3) 5000 units TABS Take 5,000 Units by  mouth daily.    Marland Kitchen desonide (DESOWEN) 0.05 % ointment APPLY TWICE DAILY TO FACE AS DIRECTED    . dicyclomine (BENTYL) 10 MG capsule Take 10 mg by mouth 4 (four) times daily -  before meals and at bedtime.    Marland Kitchen escitalopram (LEXAPRO) 20 MG tablet Take 1 tablet (20 mg total) by mouth daily. 90 tablet 0  . estradiol (ESTRACE) 0.1 MG/GM vaginal cream Use once a week as needed 42.5 g 1  . fluticasone (FLONASE) 50 MCG/ACT nasal spray 1 SPRAY IN EACH NOSTRIL ONCE ADAY AS NEEDED 16 g 1  . ibuprofen (ADVIL,MOTRIN) 200 MG tablet Take 200 mg by mouth every 6 (six) hours as needed.    . Liniments (SALONPAS PAIN RELIEF PATCH EX) Apply 1 patch topically 2 (two) times daily.    Marland Kitchen lubiprostone (AMITIZA) 8 MCG capsule Take 1 capsule po bid prn ibs constipation. 60 capsule 3  . meloxicam (MOBIC) 7.5 MG tablet Take 7.5 mg by mouth daily.    . metroNIDAZOLE (METROCREAM) 0.75 % cream APPLY TO FACE TWICE DAILY WEAR SUNSCREEN EVERY MORNING    . mirtazapine (REMERON) 15 MG tablet Take 1 tablet (15 mg total) by mouth at bedtime. For sleep 30 tablet 1  . Multiple Vitamin (MULTI-VITAMIN) tablet Take by mouth.    . Multiple Vitamins-Minerals (CENTRUM SILVER 50+WOMEN) TABS Take by mouth.    . oxybutynin (DITROPAN-XL) 10 MG 24 hr tablet Take 1 tablet (10 mg total) by mouth daily. 30 tablet 3  . pantoprazole (PROTONIX) 40 MG tablet Take 1 tablet (40 mg total) by mouth daily. 90 tablet 1  . promethazine (PHENERGAN) 25 MG tablet Take 25 mg by mouth as needed.     Marland Kitchen rOPINIRole (REQUIP) 1 MG tablet TAKE ONE TABLET 3 TIMES DAILY AS NEEDED FOR RESTLESS LEGS 90 tablet 3  . simethicone (MYLICON) 80 MG chewable tablet Chew by mouth.     No current facility-administered medications for this visit.     Musculoskeletal: Strength & Muscle Tone: UTA Gait & Station: UTA Patient leans: N/A  Psychiatric Specialty Exam: Review of Systems  Psychiatric/Behavioral: The patient is nervous/anxious.   All other systems reviewed and are  negative.  There were no vitals taken for this visit.There is no height or weight on file to calculate BMI.  General Appearance: UTA  Eye Contact:  UTA  Speech:  Clear and Coherent  Volume:  Normal  Mood:  Anxious Coping well  Affect:  UTA  Thought Process:  Goal Directed and Descriptions of Associations: Intact  Orientation:  Full (Time, Place, and Person)  Thought Content: Logical   Suicidal Thoughts:  No  Homicidal Thoughts:  No  Memory:  Immediate;   Fair Recent;   Fair Remote;   Fair  Judgement:  Fair  Insight:  Fair  Psychomotor Activity:  UTA  Concentration:  Concentration: Fair and Attention Span: Fair  Recall:  [employer] of Knowledge: Fair  Language: Fair  Akathisia:  No  Handed:  Right  AIMS (if indicated): UTA  Assets:  Communication Skills Desire for Improvement Housing Social Support  ADL's:  Intact  Cognition: WNL  Sleep:  Fair   Screenings: Mini-Mental     Clinical Support from 03/06/2019 in [facility], [place] from 03/02/2018 in [facility], [facility]  Total Score (max 30 points )  30  27    PHQ2-9     Clinical Support from 03/06/2019 in [facility], [facility] Office Visit from 11[facility]08/2018 in [facility], [facility] Office Visit from 08/08/2018 in [facility], [facility] Office Visit from 06/30/2018 in [facility] Office Visit from 05/02/2018 in [facility], [facility]  PHQ-2 Total Score  0  0  1  0  0       Assessment and Plan: Kristen Pennington is a 86 year old female who has a history of GAD, MDD, history of psychosis, essential hypertension, chronic pain, OSA, hearing loss was evaluated by phone today.  Patient with psychosocial stressors of the death of her son, current pandemic.  She is currently making progress.  Plan as noted below.  Plan GAD-stable Lexapro 20 mg p.o. daily  MDD-stable Lexapro as prescribed  Insomnia-stable Mirtazapine 7.5 to 50 mg p.o. nightly.  She takes it  as needed. Continue CPAP.  Delusional disorder-in remission We will monitor closely.  Bereavement-will monitor closely.  She is coping better.  Follow-up in clinic in 1 month or sooner if needed.  I have spent atleast 19 minutes non face to face with patient today. More than 50 % of the time was spent for preparing to see the patient ( e.g., review of test, records ),ordering medications and test ,psychoeducation and supportive psychotherapy and care coordination,as well as documenting clinical information in electronic health record. This note was generated in part or whole with voice recognition software. Voice recognition is usually quite accurate but there are transcription errors that can and very often do occur. I apologize for any typographical errors that were not detected and corrected.       Ursula Alert, MD 10/17/2019, 11:50 AM

## 2019-10-17 NOTE — Progress Notes (Signed)
Kristen Pennington, [place] [id number]  Pulmonary Sleep Medicine   Office Visit Note  Patient Name: Kristen Pennington DOB: 11-26-1928 MRN [id number]  Date of Service: 10/17/2019  Complaints/HPI: Pt is here for pulmonary follow up.  She reports since December she has been dealing with her son being in critical care for covid.  He stayed in the ICU for over 50 days, and subsequently died on 09/13/22.  In that times she was not able to use her cpap machine, mostly due to stress, and the mask breaking out her skin.  She went to see dermatology and has since discovered she has an allergy to that product.  She also will be needed a deep biopsy of her nose, and likely will not be able to wear a mask for awhile. Her last compliance reports from December 2020 showed 8% compliance with an overall ahi of 17.8, she clearly needs to use cpap.    ROS  General: (-) fever, (-) chills, (-) night sweats, (-) weakness Skin: (-) rashes, (-) itching,. Eyes: (-) visual changes, (-) redness, (-) itching. Nose and Sinuses: (-) nasal stuffiness or itchiness, (-) postnasal drip, (-) nosebleeds, (-) sinus trouble. Mouth and Throat: (-) sore throat, (-) hoarseness. Neck: (-) swollen glands, (-) enlarged thyroid, (-) neck pain. Respiratory: - cough, (-) bloody sputum, - shortness of breath, - wheezing. Cardiovascular: - ankle swelling, (-) chest pain. Lymphatic: (-) lymph node enlargement. Neurologic: (-) numbness, (-) tingling. Psychiatric: (-) anxiety, (-) depression   Current Medication: Outpatient Encounter Medications as of 10/17/2019  Medication Sig  . acetaminophen (TYLENOL) 500 MG tablet Take 500 mg by mouth every 6 (six) hours as needed.  . bisoprolol-hydrochlorothiazide (ZIAC) 5-6.25 MG tablet Take 1 tablet by mouth daily.  . Calcium Carb-Cholecalciferol 600-800 MG-UNIT TABS Take by mouth.  . Carboxymethylcellulose Sodium (THERATEARS OP) Apply to eye.  . Cholecalciferol  (VITAMIN D3) 5000 units TABS Take 5,000 Units by mouth daily.  Marland Kitchen desonide (DESOWEN) 0.05 % ointment APPLY TWICE DAILY TO FACE AS DIRECTED  . dicyclomine (BENTYL) 10 MG capsule Take 10 mg by mouth 4 (four) times daily -  before meals and at bedtime.  Marland Kitchen escitalopram (LEXAPRO) 20 MG tablet Take 1 tablet (20 mg total) by mouth daily.  Marland Kitchen estradiol (ESTRACE) 0.1 MG/GM vaginal cream Use once a week as needed  . fluticasone (FLONASE) 50 MCG/ACT nasal spray 1 SPRAY IN EACH NOSTRIL ONCE ADAY AS NEEDED  . ibuprofen (ADVIL,MOTRIN) 200 MG tablet Take 200 mg by mouth every 6 (six) hours as needed.  . Liniments (SALONPAS PAIN RELIEF PATCH EX) Apply 1 patch topically 2 (two) times daily.  Marland Kitchen lubiprostone (AMITIZA) 8 MCG capsule Take 1 capsule po bid prn ibs constipation.  . meloxicam (MOBIC) 7.5 MG tablet Take 7.5 mg by mouth daily.  . metroNIDAZOLE (METROCREAM) 0.75 % cream APPLY TO FACE TWICE DAILY WEAR SUNSCREEN EVERY MORNING  . mirtazapine (REMERON) 15 MG tablet Take 1 tablet (15 mg total) by mouth at bedtime. For sleep  . Multiple Vitamin (MULTI-VITAMIN) tablet Take by mouth.  . Multiple Vitamins-Minerals (CENTRUM SILVER 50+WOMEN) TABS Take by mouth.  . oxybutynin (DITROPAN-XL) 10 MG 24 hr tablet Take 1 tablet (10 mg total) by mouth daily.  . pantoprazole (PROTONIX) 40 MG tablet Take 1 tablet (40 mg total) by mouth daily.  . promethazine (PHENERGAN) 25 MG tablet Take 25 mg by mouth as needed.   Marland Kitchen rOPINIRole (REQUIP) 1 MG tablet TAKE ONE TABLET 3 TIMES  DAILY AS NEEDED FOR RESTLESS LEGS  . simethicone (MYLICON) 80 MG chewable tablet Chew by mouth.   No facility-administered encounter medications on file as of 10/17/2019.    Surgical History: Past Surgical History:  Procedure Laterality Date  . COLONOSCOPY  05-03-15  . COLONOSCOPY WITH PROPOFOL N/A 05/03/2015   Procedure: COLONOSCOPY WITH PROPOFOL;  Surgeon: Lucilla Lame, MD;  Location: [facility];  Service: Endoscopy;  Laterality: N/A;  CPAP  .  DILATION AND CURETTAGE OF UTERUS  1970  . ENDOMETRIAL BIOPSY    . EYE SURGERY Right 2017  . POLYPECTOMY  05/03/2015   Procedure: POLYPECTOMY;  Surgeon: Lucilla Lame, MD;  Location: [facility];  Service: Endoscopy;;  . ROTATOR CUFF REPAIR  2007  . SPINE SURGERY      Medical History: Past Medical History:  Diagnosis Date  . Arthritis   . Atrophic vaginitis 03/06/2013   Last Assessment & Plan:  She will plan to continue estradiol vaginal cream.  Prescription was rewritten stipulating use of the generic product.   . Atypical migraine 03/06/2013   Last Assessment & Plan:  Since she rarely takes sumatriptan, we will discontinue it. Continue sparing use of OTC migraine products.   . Avitaminosis D 03/06/2013   Last Assessment & Plan:  Recheck vitamin D level. Plan to continue vitamin D supplementation.   . Ceratitis 08/01/2013   Last Assessment & Plan:  Because of the expense, she will discontinue Restasis. She will use over-the-counter moisturizing eyedrops and liquid gel.   . Colon polyp   . 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   . 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   . Cystocele, midline 03/06/2013  . Demoralization and apathy 03/06/2013   Last Assessment & Plan:  Since she has been taking bupropion only once a day, I will write the prescription with the correct instructions and correct quantity. She has done well on this for years and she is encouraged to take it regularly   . Depression   . Environmental allergies   . Epistaxis   . Essential (primary) hypertension 05/17/2013   Last Assessment & Plan:  Her blood pressure is well-controlled. We will plan to continue hydrochlorothiazide and benazepril.  Both are generic and should be affordable on her health plan.   Marland Kitchen GERD (gastroesophageal reflux  disease)   . Hemorrhoid   . Inflammation of sacroiliac joint ([place]) 03/06/2013   Last Assessment & Plan:  She is doing well on her present regimen of fentanyl patch supplemented with sparing use of tramadol/acetaminophen. We will continue that regimen.   . Sinusitis   . Sleep apnea    CPAP    Family History: Family History  Problem Relation Age of Onset  . Arthritis Mother   . Alzheimer's disease Father   . Heart disease Sister   . Cancer Brother   . Ovarian cancer Neg Hx   . Breast cancer Neg Hx   . Colon cancer Neg Hx   . Diabetes Neg Hx     Social History: Social History   Socioeconomic History  . Marital status: Widowed    Spouse name: Not on file  . Number of children: 2  . Years of education: Not on file  . Highest education level: Some college, no degree  Occupational History  . Not on  file  Tobacco Use  . Smoking status: Never Smoker  . Smokeless tobacco: Never Used  Substance and Sexual Activity  . Alcohol use: No    Alcohol/week: 0.0 standard drinks  . Drug use: No  . Sexual activity: Not Currently  Other Topics Concern  . Not on file  Social History Narrative  . Not on file   Social Determinants of Health   Financial Resource Strain:   . Difficulty of Paying Living Expenses:   Food Insecurity:   . Worried About Charity fundraiser in the Last Year:   . Arboriculturist in the Last Year:   Transportation Needs:   . Film/video editor (Medical):   Marland Kitchen Lack of Transportation (Non-Medical):   Physical Activity:   . Days of Exercise per Week:   . Minutes of Exercise per Session:   Stress:   . Feeling of Stress :   Social Connections:   . Frequency of Communication with Friends and Family:   . Frequency of Social Gatherings with Friends and Family:   . Attends Religious Services:   . Active Member of Clubs or Organizations:   . Attends Archivist Meetings:   Marland Kitchen Marital Status:   Intimate Partner Violence:   . Fear of Current or  Ex-Partner:   . Emotionally Abused:   Marland Kitchen Physically Abused:   . Sexually Abused:     Vital Signs: Blood pressure 128/72, pulse 62, resp. rate 16, height 5\' 5"  (1.651 m), weight 160 lb (72.6 kg), SpO2 96 %.  Examination: General Appearance: The patient is well-developed, well-nourished, and in no distress. Skin: Gross inspection of skin unremarkable. Head: normocephalic, no gross deformities. Eyes: no gross deformities noted. ENT: ears appear grossly normal no exudates. Neck: Supple. No thyromegaly. No LAD. Respiratory: clear bilaterly. Cardiovascular: Normal S1 and S2 without murmur or rub. Extremities: No cyanosis. pulses are equal. Neurologic: Alert and oriented. No involuntary movements.  LABS: Recent Results (from the past 2160 hour(s))  POCT Urinalysis Dipstick     Status: None   Collection Time: 07/25/19  3:51 PM  Result Value Ref Range   Color, UA     Clarity, UA     Glucose, UA Negative Negative   Bilirubin, UA negative    Ketones, UA negative    Spec Grav, UA 1.025 1.010 - 1.025   Blood, UA trace    pH, UA 5.0 5.0 - 8.0   Protein, UA Negative Negative   Urobilinogen, UA 0.2 0.2 or 1.0 E.U./dL   Nitrite, UA negative    Leukocytes, UA Negative Negative   Appearance     Odor      Radiology: DG Lumbar Spine Complete  Result Date: 08/11/2018 CLINICAL DATA:  Fall from ladder several years ago with persistent low back pain, initial encounter EXAM: LUMBAR SPINE - COMPLETE 4+ VIEW COMPARISON:  01/08/2016 FINDINGS: Postsurgical changes are again noted at L4-5. Vertebral body height is well maintained. No pars defects are noted. Diffuse aortic calcifications are seen. Mild osteophytic changes are noted as well. IMPRESSION: Stable postsurgical changes at L4-5. Mild degenerative change without acute abnormality. Electronically Signed   By: Inez Catalina M.D.   On: 08/11/2018 14:17    No results found.  No results found.    Assessment and Plan: Patient Active Problem  List   Diagnosis Date Noted  . MDD (major depressive disorder), recurrent, in partial remission ([place]) 09/19/2019  . Bereavement 09/19/2019  . Chronic pain syndrome 08/21/2019  .  Pharmacologic therapy 08/21/2019  . Disorder of skeletal system 08/21/2019  . Problems influencing health status 08/21/2019  . History of lumbar spinal fusion 08/21/2019  . Chronic hip pain (Bilateral) (R>L) 08/21/2019  . Lumbar facet joint syndrome (Bilateral) 08/21/2019  . Dysuria 07/02/2019  . Delusional disorder ([place]) 03/08/2019  . MDD (major depressive disorder), recurrent episode, mild ([place]) 01/18/2019  . 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 History of psychosis 01/18/2019  . Gastroesophageal reflux disease without esophagitis 11/27/2018  . Irritable bowel syndrome with constipation 11/27/2018  . Chronic low back pain (Bilateral) (R>L) w/o sciatica 08/17/2018  . Vitamin B12 deficiency 08/17/2018  . Elevated TSH 08/17/2018  . Urinary tract infection without hematuria 08/16/2017  . Generalized anxiety disorder 08/16/2017  . Primary generalized (osteo)arthritis 08/16/2017  . Melena 12/05/2015  . Blood in stool   . Benign neoplasm of transverse colon   . Benign neoplasm of cecum   . Ceratitis 08/01/2013  . Keratitis sicca ([place]) 08/01/2013  . Essential (primary) hypertension 05/17/2013  . Cystocele, midline 03/06/2013  . Demoralization and apathy 03/06/2013  . Atypical migraine 03/06/2013  . Hyperlipidemia 03/06/2013  . Jittery 03/06/2013  . 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 Atrophic vaginitis 03/06/2013  . Inflammation of sacroiliac joint ([place]) 03/06/2013  . Avitaminosis D 03/06/2013  . Allergic rhinitis due to pollen 03/06/2013  . Body mass index between 19-24, adult 03/06/2013  . Breast screening 03/06/2013  . 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 Screening for depression 03/06/2013    1. OSA (obstructive sleep apnea) No  currently using machine as insurance company took it back.  Discussed doing sleep study again, and starting process over.  She will think about this.   2. Gastroesophageal reflux disease without esophagitis Stable, continue present management.   3. Generalized anxiety disorder Stable currently, patient intermittently tearful about sons recent death.  General Counseling: I have discussed the findings of the evaluation and examination with Kristen Pennington.  I have also discussed any further diagnostic evaluation thatmay be needed or ordered today. Kristen Pennington verbalizes understanding of the findings of todays visit. We also reviewed her medications today and discussed drug interactions and side effects including but not limited excessive drowsiness and altered mental states. We also discussed that there is always a risk not just to her but also people around her. she has been encouraged to call the office with any questions or concerns that should arise related to todays visit.  No orders of the defined types were placed in this encounter.    Time spent: 30 This patient was seen by Orson Gear AGNP-C in Collaboration with Dr. Devona Konig as a part of collaborative care agreement.   I have personally obtained a history, examined the patient, evaluated laboratory and imaging results, formulated the assessment and plan and placed orders.    Allyne Gee, MD [facility] Pulmonary and Critical Care Sleep medicine

## 2019-10-18 DIAGNOSIS — C4492 Squamous cell carcinoma of skin, unspecified: Secondary | ICD-10-CM | POA: Diagnosis not present

## 2019-10-18 DIAGNOSIS — L578 Other skin changes due to chronic exposure to nonionizing radiation: Secondary | ICD-10-CM | POA: Diagnosis not present

## 2019-10-19 DIAGNOSIS — R140 Abdominal distension (gaseous): Secondary | ICD-10-CM | POA: Diagnosis not present

## 2019-10-19 DIAGNOSIS — K581 Irritable bowel syndrome with constipation: Secondary | ICD-10-CM | POA: Diagnosis not present

## 2019-10-19 DIAGNOSIS — R110 Nausea: Secondary | ICD-10-CM | POA: Diagnosis not present

## 2019-10-19 DIAGNOSIS — I959 Hypotension, unspecified: Secondary | ICD-10-CM | POA: Diagnosis not present

## 2019-10-19 DIAGNOSIS — K219 Gastro-esophageal reflux disease without esophagitis: Secondary | ICD-10-CM | POA: Diagnosis not present

## 2019-10-23 DIAGNOSIS — H16223 Keratoconjunctivitis sicca, not specified as Sjogren's, bilateral: Secondary | ICD-10-CM | POA: Diagnosis not present

## 2019-10-24 ENCOUNTER — Telehealth: Payer: Self-pay

## 2019-10-24 NOTE — Telephone Encounter (Signed)
Confirmed and screened for 10-26-19 ov. 

## 2019-10-26 ENCOUNTER — Encounter: Payer: Self-pay | Admitting: Nurse Practitioner

## 2019-10-26 ENCOUNTER — Ambulatory Visit (INDEPENDENT_AMBULATORY_CARE_PROVIDER_SITE_OTHER): Payer: Medicare HMO | Admitting: Nurse Practitioner

## 2019-10-26 ENCOUNTER — Other Ambulatory Visit
Admission: RE | Admit: 2019-10-26 | Discharge: 2019-10-26 | Disposition: A | Discharge status: Home / Self Care | Payer: Medicare HMO | Source: Ambulatory Visit | Attending: Pain Medicine | Admitting: Pain Medicine

## 2019-10-26 ENCOUNTER — Other Ambulatory Visit: Payer: Self-pay

## 2019-10-26 ENCOUNTER — Ambulatory Visit
Admission: RE | Admit: 2019-10-26 | Discharge: 2019-10-26 | Disposition: A | Discharge status: Home / Self Care | Payer: Medicare HMO | Source: Ambulatory Visit | Attending: Pain Medicine | Admitting: Pain Medicine

## 2019-10-26 VITALS — BP 145/69 | HR 60 | Temp 97.7°F | Resp 16 | Ht 65.0 in | Wt 159.2 lb

## 2019-10-26 DIAGNOSIS — M47816 Spondylosis without myelopathy or radiculopathy, lumbar region: Secondary | ICD-10-CM

## 2019-10-26 DIAGNOSIS — M25552 Pain in left hip: Secondary | ICD-10-CM | POA: Insufficient documentation

## 2019-10-26 DIAGNOSIS — M545 Low back pain: Secondary | ICD-10-CM | POA: Insufficient documentation

## 2019-10-26 DIAGNOSIS — G4733 Obstructive sleep apnea (adult) (pediatric): Secondary | ICD-10-CM

## 2019-10-26 DIAGNOSIS — I10 Essential (primary) hypertension: Secondary | ICD-10-CM | POA: Diagnosis not present

## 2019-10-26 DIAGNOSIS — M25551 Pain in right hip: Secondary | ICD-10-CM

## 2019-10-26 DIAGNOSIS — Z981 Arthrodesis status: Secondary | ICD-10-CM | POA: Insufficient documentation

## 2019-10-26 DIAGNOSIS — Z9989 Dependence on other enabling machines and devices: Secondary | ICD-10-CM | POA: Insufficient documentation

## 2019-10-26 DIAGNOSIS — M1612 Unilateral primary osteoarthritis, left hip: Secondary | ICD-10-CM | POA: Diagnosis not present

## 2019-10-26 DIAGNOSIS — G8929 Other chronic pain: Secondary | ICD-10-CM | POA: Diagnosis present

## 2019-10-26 DIAGNOSIS — K581 Irritable bowel syndrome with constipation: Secondary | ICD-10-CM

## 2019-10-26 DIAGNOSIS — M1611 Unilateral primary osteoarthritis, right hip: Secondary | ICD-10-CM | POA: Diagnosis not present

## 2019-10-26 DIAGNOSIS — K219 Gastro-esophageal reflux disease without esophagitis: Secondary | ICD-10-CM

## 2019-10-26 DIAGNOSIS — M5136 Other intervertebral disc degeneration, lumbar region: Secondary | ICD-10-CM | POA: Diagnosis not present

## 2019-10-26 LAB — VITAMIN B12: Vitamin B-12: 460 pg/mL (ref 180–914)

## 2019-10-26 LAB — C-REACTIVE PROTEIN: CRP: <0.5 mg/dL (ref <1.0)

## 2019-10-26 LAB — SEDIMENTATION RATE: Sed Rate: 2 mm/hr (ref 0–30)

## 2019-10-26 NOTE — Progress Notes (Signed)
[facility] [place], [place] [address]  Internal MEDICINE  Office Visit Note  Patient Name: Kristen Pennington  [id number]  [id number]  Date of Service: 10/26/2019  Chief Complaint  Patient presents with  . Hyperlipidemia  . Hypertension  . Depression    The patient is here for routine follow up. She has had a few episodes of elevated pressure. When her blood pressure elevates, she feels poorly, weak, and gets dizzy. She had initial appointment with pain management provider. Today, Blood pressure is well managed. She states that she definitely has more energy. She had labs done and new x-ray of her lumbar spine. She will have a follow up with them later on to discuss results and treatment plan. She states that she "doesn't feel good.' pain is getting a little more severe. She has been going through some depression. She recently lost he son who had been ill. He passed away on Sep 10, 2022 and he was buried on 09/09/2019.       Current Medication: Outpatient Encounter Medications as of 10/26/2019  Medication Sig  . acetaminophen (TYLENOL) 500 MG tablet Take 500 mg by mouth every 6 (six) hours as needed.  . bisoprolol-hydrochlorothiazide (ZIAC) 5-6.25 MG tablet Take 1 tablet by mouth daily.  . Calcium Carb-Cholecalciferol 600-800 MG-UNIT TABS Take by mouth.  . Carboxymethylcellulose Sodium (THERATEARS OP) Apply to eye.  . Cholecalciferol (VITAMIN D3) 5000 units TABS Take 5,000 Units by mouth daily.  Marland Kitchen desonide (DESOWEN) 0.05 % ointment APPLY TWICE DAILY TO FACE AS DIRECTED  . dicyclomine (BENTYL) 10 MG capsule Take 10 mg by mouth 4 (four) times daily -  before meals and at bedtime.  Marland Kitchen escitalopram (LEXAPRO) 20 MG tablet Take 1 tablet (20 mg total) by mouth daily.  Marland Kitchen estradiol (ESTRACE) 0.1 MG/GM vaginal cream Use once a week as needed  . fluticasone (FLONASE) 50 MCG/ACT nasal spray 1 SPRAY IN EACH NOSTRIL ONCE ADAY AS NEEDED  . ibuprofen (ADVIL,MOTRIN) 200 MG tablet  Take 200 mg by mouth every 6 (six) hours as needed.  . Liniments (SALONPAS PAIN RELIEF PATCH EX) Apply 1 patch topically 2 (two) times daily.  Marland Kitchen lubiprostone (AMITIZA) 8 MCG capsule Take 1 capsule po bid prn ibs constipation.  . meloxicam (MOBIC) 7.5 MG tablet Take 7.5 mg by mouth daily.  . metroNIDAZOLE (METROCREAM) 0.75 % cream APPLY TO FACE TWICE DAILY WEAR SUNSCREEN EVERY MORNING  . mirtazapine (REMERON) 15 MG tablet Take 1 tablet (15 mg total) by mouth at bedtime. For sleep  . Multiple Vitamin (MULTI-VITAMIN) tablet Take by mouth.  . Multiple Vitamins-Minerals (CENTRUM SILVER 50+WOMEN) TABS Take by mouth.  . oxybutynin (DITROPAN-XL) 10 MG 24 hr tablet Take 1 tablet (10 mg total) by mouth daily.  . pantoprazole (PROTONIX) 40 MG tablet Take 1 tablet (40 mg total) by mouth daily.  . promethazine (PHENERGAN) 25 MG tablet Take 25 mg by mouth as needed.   Marland Kitchen rOPINIRole (REQUIP) 1 MG tablet TAKE ONE TABLET 3 TIMES DAILY AS NEEDED FOR RESTLESS LEGS  . simethicone (MYLICON) 80 MG chewable tablet Chew by mouth.  Marland Kitchen XIIDRA 5 % SOLN Apply to eye. Sample given by eye dr   No facility-administered encounter medications on file as of 10/26/2019.    Surgical History: Past Surgical History:  Procedure Laterality Date  . COLONOSCOPY  05-03-15  . COLONOSCOPY WITH PROPOFOL N/A 05/03/2015   Procedure: COLONOSCOPY WITH PROPOFOL;  Surgeon: Lucilla Lame, MD;  Location: [facility];  Service: Endoscopy;  Laterality: N/A;  CPAP  . DILATION AND CURETTAGE OF UTERUS  1970  . ENDOMETRIAL BIOPSY    . EYE SURGERY Right 2017  . POLYPECTOMY  05/03/2015   Procedure: POLYPECTOMY;  Surgeon: Lucilla Lame, MD;  Location: [facility];  Service: Endoscopy;;  . ROTATOR CUFF REPAIR  2007  . SPINE SURGERY      Medical History: Past Medical History:  Diagnosis Date  . Arthritis   . Atrophic vaginitis 03/06/2013   Last Assessment & Plan:  She will plan to continue estradiol vaginal cream.  Prescription was  rewritten stipulating use of the generic product.   . Atypical migraine 03/06/2013   Last Assessment & Plan:  Since she rarely takes sumatriptan, we will discontinue it. Continue sparing use of OTC migraine products.   . Avitaminosis D 03/06/2013   Last Assessment & Plan:  Recheck vitamin D level. Plan to continue vitamin D supplementation.   . Ceratitis 08/01/2013   Last Assessment & Plan:  Because of the expense, she will discontinue Restasis. She will use over-the-counter moisturizing eyedrops and liquid gel.   . Colon polyp   . 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   . 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   . Cystocele, midline 03/06/2013  . Demoralization and apathy 03/06/2013   Last Assessment & Plan:  Since she has been taking bupropion only once a day, I will write the prescription with the correct instructions and correct quantity. She has done well on this for years and she is encouraged to take it regularly   . Depression   . Environmental allergies   . Epistaxis   . Essential (primary) hypertension 05/17/2013   Last Assessment & Plan:  Her blood pressure is well-controlled. We will plan to continue hydrochlorothiazide and benazepril.  Both are generic and should be affordable on her health plan.   Marland Kitchen GERD (gastroesophageal reflux disease)   . Hemorrhoid   . Inflammation of sacroiliac joint ([place]) 03/06/2013   Last Assessment & Plan:  She is doing well on her present regimen of fentanyl patch supplemented with sparing use of tramadol/acetaminophen. We will continue that regimen.   . Sinusitis   . Sleep apnea    CPAP    Family History: Family History  Problem Relation Age of Onset  . Arthritis Mother   . Alzheimer's disease Father   . Heart disease Sister   . Cancer Brother   . Ovarian cancer  Neg Hx   . Breast cancer Neg Hx   . Colon cancer Neg Hx   . Diabetes Neg Hx     Social History   Socioeconomic History  . Marital status: Widowed    Spouse name: Not on file  . Number of children: 2  . Years of education: Not on file  . Highest education level: Some college, no degree  Occupational History  . Not on file  Tobacco Use  . Smoking status: Never Smoker  . Smokeless tobacco: Never Used  Substance and Sexual Activity  . Alcohol use: No    Alcohol/week: 0.0 standard drinks  . Drug use: No  . Sexual activity: Not Currently  Other Topics Concern  . Not on file  Social History Narrative  . Not on file   Social Determinants of Health   Financial Resource Strain:   .  Difficulty of Paying Living Expenses:   Food Insecurity:   . Worried About Charity fundraiser in the Last Year:   . Arboriculturist in the Last Year:   Transportation Needs:   . Film/video editor (Medical):   Marland Kitchen Lack of Transportation (Non-Medical):   Physical Activity:   . Days of Exercise per Week:   . Minutes of Exercise per Session:   Stress:   . Feeling of Stress :   Social Connections:   . Frequency of Communication with Friends and Family:   . Frequency of Social Gatherings with Friends and Family:   . Attends Religious Services:   . Active Member of Clubs or Organizations:   . Attends Archivist Meetings:   Marland Kitchen Marital Status:   Intimate Partner Violence:   . Fear of Current or Ex-Partner:   . Emotionally Abused:   Marland Kitchen Physically Abused:   . Sexually Abused:       Review of Systems  Constitutional: Negative for activity change, chills, fatigue and unexpected weight change.       States that she has more energy today than she has had in a while.   HENT: Negative for congestion, postnasal drip, rhinorrhea, sinus pressure, sinus pain, sneezing and sore throat.   Respiratory: Negative for cough, chest tightness, shortness of breath and wheezing.   Cardiovascular:  Negative for chest pain and palpitations.       Well managed blood pressure.  Gastrointestinal: Negative for abdominal pain, constipation, diarrhea, nausea and vomiting.  Endocrine: Negative for cold intolerance, heat intolerance, polydipsia and polyuria.  Genitourinary: Negative for urgency.  Musculoskeletal: Positive for back pain and myalgias. Negative for arthralgias, joint swelling and neck pain.       Intermittent low back pain which can be severe. Started back in September when weather changes to cooler temperatures. Has started seeing pain management. Had labs and new x-ray done today and will follow up with them once results are back.   Skin: Negative for rash.  Neurological: Positive for weakness and headaches. Negative for tremors and numbness.  Hematological: Negative for adenopathy. Does not bruise/bleed easily.  Psychiatric/Behavioral: Negative for behavioral problems (Depression), sleep disturbance and suicidal ideas. The patient is nervous/anxious.        Patient is routinely seeing psychiatry.    Today's Vitals   10/26/19 1413  BP: (!) 145/69  Pulse: 60  Resp: 16  Temp: 97.7 F (36.5 C)  SpO2: 94%  Weight: 159 lb 3.2 oz (72.2 kg)  Height: 5\' 5"  (1.651 m)   Body mass index is 26.49 kg/m.  Physical Exam Vitals and nursing note reviewed.  Constitutional:      General: She is not in acute distress.    Appearance: Normal appearance. She is well-developed. She is not diaphoretic.  HENT:     Head: Normocephalic and atraumatic.     Nose: Nose normal.     Mouth/Throat:     Pharynx: No oropharyngeal exudate.  Eyes:     Pupils: Pupils are equal, round, and reactive to light.  Neck:     Thyroid: No thyromegaly.     Vascular: No JVD.     Trachea: No tracheal deviation.  Cardiovascular:     Rate and Rhythm: Normal rate and regular rhythm.     Heart sounds: Normal heart sounds. No murmur. No friction rub. No gallop.   Pulmonary:     Effort: Pulmonary effort is  normal. No respiratory distress.  Breath sounds: Normal breath sounds. No wheezing or rales.  Chest:     Chest wall: No tenderness.  Abdominal:     Palpations: Abdomen is soft.  Musculoskeletal:        General: Normal range of motion.     Cervical back: Normal range of motion and neck supple.     Comments: She does have moderate lower back pain, worse with palpation and worse with sitting or standing for long periods of time. She is using a walking cane to help with mobility.   Lymphadenopathy:     Cervical: No cervical adenopathy.  Skin:    General: Skin is warm and dry.  Neurological:     Mental Status: She is alert and oriented to person, place, and time. Mental status is at baseline.     Cranial Nerves: No cranial nerve deficit.  Psychiatric:        Behavior: Behavior normal.        Thought Content: Thought content normal.        Judgment: Judgment normal.    Assessment/Plan: 1. Essential (primary) hypertension Stable. Continue bp medication as prescribed   2. OSA (obstructive sleep apnea) Continue regular visits with Dr. Devona Konig for CPAP management  3. Lumbar facet joint syndrome (Bilateral) Follow up with pain management as scheduled   4. Irritable bowel syndrome with constipation Stable. Continue meds as prescribed   5. Gastroesophageal reflux disease without esophagitis Continue pantoprazole.   General Counseling: orvetta barrientez understanding of the findings of todays visit and agrees with plan of treatment. I have discussed any further diagnostic evaluation that may be needed or ordered today. We also reviewed her medications today. she has been encouraged to call the office with any questions or concerns that should arise related to todays visit.  This patient was seen by Leretha Pol FNP Collaboration with Dr Lavera Guise as a part of collaborative care agreement  Total time spent: 30 Minutes  Time spent includes review of chart, medications, test  results, and follow up plan with the patient.      Dr Lavera Guise Internal medicine

## 2019-10-30 LAB — 25-HYDROXY VITAMIN D LCMS D2+D3
25-Hydroxy, Vitamin D-2: <1 ng/mL
25-Hydroxy, Vitamin D-3: 80 ng/mL
25-Hydroxy, Vitamin D: 80 ng/mL

## 2019-11-01 ENCOUNTER — Other Ambulatory Visit: Payer: Self-pay

## 2019-11-01 MED ORDER — MELOXICAM 7.5 MG PO TABS: 7.5000 mg | ORAL_TABLET | Freq: Every day | ORAL | 5 refills | Status: DC

## 2019-11-01 MED ORDER — FLUTICASONE PROPIONATE 50 MCG/ACT NA SUSP: NASAL | 1 refills | Status: DC

## 2019-11-14 ENCOUNTER — Telehealth (INDEPENDENT_AMBULATORY_CARE_PROVIDER_SITE_OTHER): Payer: Medicare HMO | Admitting: Psychiatry

## 2019-11-14 ENCOUNTER — Other Ambulatory Visit: Payer: Self-pay

## 2019-11-14 ENCOUNTER — Encounter: Payer: Self-pay | Admitting: Psychiatry

## 2019-11-14 DIAGNOSIS — F3342 Major depressive disorder, recurrent, in full remission: Secondary | ICD-10-CM | POA: Diagnosis not present

## 2019-11-14 DIAGNOSIS — F5105 Insomnia due to other mental disorder: Secondary | ICD-10-CM

## 2019-11-14 DIAGNOSIS — F411 Generalized anxiety disorder: Secondary | ICD-10-CM

## 2019-11-14 DIAGNOSIS — Z634 Disappearance and death of family member: Secondary | ICD-10-CM | POA: Diagnosis not present

## 2019-11-14 DIAGNOSIS — R69 Illness, unspecified: Secondary | ICD-10-CM | POA: Diagnosis not present

## 2019-11-14 DIAGNOSIS — F22 Delusional disorders: Secondary | ICD-10-CM | POA: Diagnosis not present

## 2019-11-14 MED ORDER — MIRTAZAPINE 15 MG PO TABS: 15.0000 mg | ORAL_TABLET | Freq: Every day | ORAL | 0 refills | Status: DC

## 2019-11-14 NOTE — Progress Notes (Signed)
Provider Location : [facility] Patient Location : Home  Virtual Visit via Telephone Note  I connected with [name] on 11/14/19 at 11:20 AM EDT by telephone and verified that I am speaking with the correct person using two identifiers.   I discussed the limitations, risks, security and privacy concerns of performing an evaluation and management service by telephone and the availability of in person appointments. I also discussed with the patient that there may be a patient responsible charge related to this service. The patient expressed understanding and agreed to proceed.   I discussed the assessment and treatment plan with the patient. The patient was provided an opportunity to ask questions and all were answered. The patient agreed with the plan and demonstrated an understanding of the instructions.   The patient was advised to call back or seek an in-person evaluation if the symptoms worsen or if the condition fails to improve as anticipated.   [facility] MD OP Progress Note  11/14/2019 12:09 PM KAWTHER POPPELL  MRN:  [id number]  Chief Complaint:  Chief Complaint    Follow-up     HPI: Khyli is an 86 year old Caucasian female, widowed, lives in [place], has a history of GAD, MDD, delusional disorder, insomnia, hearing loss, vision loss, OSA, hypertension was evaluated by phone today.  Patient reported to a phone call.  Patient today reports she continues to grieve the loss of her son Kyung Rudd who passed away recently due to COVID-19 infection.  She however reports she is coping better than before.  She is making use of her support system.  She is currently in a lot of pain.  She struggles with back pain.  She had imaging done and has upcoming appointment with pain provider.  She looks forward to that.  Patient however reports she is compliant on her other medications for her mood and sleep.  She reports overall she is doing okay.  Patient denies any suicidality, homicidality or  perceptual disturbances.  Patient denies any other concerns today. Visit Diagnosis:    ICD-10-CM   1. Generalized anxiety disorder  F41.1 mirtazapine (REMERON) 15 MG tablet   stable  2. MDD (major depressive disorder), recurrent, in full remission ([place])  F33.42 mirtazapine (REMERON) 15 MG tablet   stable  3. Delusional disorder ([place])  F22   4. Insomnia due to mental condition  F51.05 mirtazapine (REMERON) 15 MG tablet  5. Bereavement  Z63.4     Past Psychiatric History: I have reviewed past psychiatric history from my progress note on 07/25/2018.  Past trials of Klonopin, Abilify, Lexapro, mirtazapine.  Past Medical History:  Past Medical History:  Diagnosis Date  . Arthritis   . Atrophic vaginitis 03/06/2013   Last Assessment & Plan:  She will plan to continue estradiol vaginal cream.  Prescription was rewritten stipulating use of the generic product.   . Atypical migraine 03/06/2013   Last Assessment & Plan:  Since she rarely takes sumatriptan, we will discontinue it. Continue sparing use of OTC migraine products.   . Avitaminosis D 03/06/2013   Last Assessment & Plan:  Recheck vitamin D level. Plan to continue vitamin D supplementation.   . Ceratitis 08/01/2013   Last Assessment & Plan:  Because of the expense, she will discontinue Restasis. She will use over-the-counter moisturizing eyedrops and liquid gel.   . Colon polyp   . 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   . 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   . Cystocele, midline 03/06/2013  . Demoralization and apathy 03/06/2013   Last Assessment & Plan:  Since she has been taking bupropion only once a day, I will write the prescription with the correct instructions and correct quantity. She has done well on this for years and she is encouraged to  take it regularly   . Depression   . Environmental allergies   . Epistaxis   . Essential (primary) hypertension 05/17/2013   Last Assessment & Plan:  Her blood pressure is well-controlled. We will plan to continue hydrochlorothiazide and benazepril.  Both are generic and should be affordable on her health plan.   Marland Kitchen GERD (gastroesophageal reflux disease)   . Hemorrhoid   . Inflammation of sacroiliac joint ([place]) 03/06/2013   Last Assessment & Plan:  She is doing well on her present regimen of fentanyl patch supplemented with sparing use of tramadol/acetaminophen. We will continue that regimen.   . Sinusitis   . Sleep apnea    CPAP    Past Surgical History:  Procedure Laterality Date  . COLONOSCOPY  05-03-15  . COLONOSCOPY WITH PROPOFOL N/A 05/03/2015   Procedure: COLONOSCOPY WITH PROPOFOL;  Surgeon: Lucilla Lame, MD;  Location: [facility];  Service: Endoscopy;  Laterality: N/A;  CPAP  . DILATION AND CURETTAGE OF UTERUS  1970  . ENDOMETRIAL BIOPSY    . EYE SURGERY Right 2017  . POLYPECTOMY  05/03/2015   Procedure: POLYPECTOMY;  Surgeon: Lucilla Lame, MD;  Location: [facility];  Service: Endoscopy;;  . ROTATOR CUFF REPAIR  2007  . SPINE SURGERY      Family Psychiatric History: I have reviewed family psychiatric history from my progress note on 07/25/2018.  Family History:  Family History  Problem Relation Age of Onset  . Arthritis Mother   . Alzheimer's disease Father   . Heart disease Sister   . Cancer Brother   . Ovarian cancer Neg Hx   . Breast cancer Neg Hx   . Colon cancer Neg Hx   . Diabetes Neg Hx     Social History: Reviewed social history from my progress note on 07/25/2018. Social History   Socioeconomic History  . Marital status: Widowed    Spouse name: Not on file  . Number of children: 2  . Years of education: Not on file  . Highest education level: Some college, no degree  Occupational History  . Not on file  Tobacco Use  . Smoking status:  Never Smoker  . Smokeless tobacco: Never Used  Substance and Sexual Activity  . Alcohol use: No    Alcohol/week: 0.0 standard drinks  . Drug use: No  . Sexual activity: Not Currently  Other Topics Concern  . Not on file  Social History Narrative  . Not on file   Social Determinants of Health   Financial Resource Strain:   . Difficulty of Paying Living Expenses:   Food Insecurity:   . Worried About Charity fundraiser in the Last Year:   . Arboriculturist in the Last Year:   Transportation Needs:   . Film/video editor (Medical):   Marland Kitchen Lack of Transportation (Non-Medical):   Physical Activity:   . Days of Exercise per Week:   . Minutes of Exercise per Session:   Stress:   . Feeling of Stress :   Social Connections:   .  Frequency of Communication with Friends and Family:   . Frequency of Social Gatherings with Friends and Family:   . Attends Religious Services:   . Active Member of Clubs or Organizations:   . Attends Archivist Meetings:   Marland Kitchen Marital Status:     Allergies:  Allergies  Allergen Reactions  . Linaclotide Swelling    Metabolic Disorder Labs: Lab Results  Component Value Date   HGBA1C 5.4 08/01/2018   Lab Results  Component Value Date   PROLACTIN 8.1 08/01/2018   Lab Results  Component Value Date   CHOL 245 (H) 08/01/2018   TRIG 187 (H) 08/01/2018   HDL 49 08/01/2018   LDLCALC 159 (H) 08/01/2018   Lab Results  Component Value Date   TSH 2.830 08/10/2018   TSH 5.550 (H) 08/01/2018    Therapeutic Level Labs: No results found for: LITHIUM No results found for: VALPROATE No components found for:  CBMZ  Current Medications: Current Outpatient Medications  Medication Sig Dispense Refill  . acetaminophen (TYLENOL) 500 MG tablet Take 500 mg by mouth every 6 (six) hours as needed.    . bisoprolol-hydrochlorothiazide (ZIAC) 5-6.25 MG tablet Take 1 tablet by mouth daily. 30 tablet 3  . Calcium Carb-Cholecalciferol 600-800 MG-UNIT  TABS Take by mouth.    . Carboxymethylcellulose Sodium (THERATEARS OP) Apply to eye.    . Cholecalciferol (VITAMIN D3) 5000 units TABS Take 5,000 Units by mouth daily.    Marland Kitchen dicyclomine (BENTYL) 10 MG capsule Take 10 mg by mouth 4 (four) times daily -  before meals and at bedtime.    Marland Kitchen escitalopram (LEXAPRO) 20 MG tablet Take 1 tablet (20 mg total) by mouth daily. 90 tablet 0  . fluticasone (FLONASE) 50 MCG/ACT nasal spray 1 SPRAY IN EACH NOSTRIL ONCE ADAY AS NEEDED 16 g 1  . Liniments (SALONPAS PAIN RELIEF PATCH EX) Apply 1 patch topically 2 (two) times daily.    Marland Kitchen lubiprostone (AMITIZA) 8 MCG capsule Take 1 capsule po bid prn ibs constipation. 60 capsule 3  . meloxicam (MOBIC) 7.5 MG tablet Take 1 tablet (7.5 mg total) by mouth daily. 30 tablet 5  . mirtazapine (REMERON) 15 MG tablet Take 1 tablet (15 mg total) by mouth at bedtime. For sleep 90 tablet 0  . Multiple Vitamin (MULTI-VITAMIN) tablet Take by mouth.    . Multiple Vitamins-Minerals (CENTRUM SILVER 50+WOMEN) TABS Take by mouth.    . oxybutynin (DITROPAN-XL) 10 MG 24 hr tablet Take 1 tablet (10 mg total) by mouth daily. 30 tablet 3  . pantoprazole (PROTONIX) 40 MG tablet Take 1 tablet (40 mg total) by mouth daily. 90 tablet 1  . promethazine (PHENERGAN) 25 MG tablet Take 25 mg by mouth as needed.     Marland Kitchen rOPINIRole (REQUIP) 1 MG tablet TAKE ONE TABLET 3 TIMES DAILY AS NEEDED FOR RESTLESS LEGS 90 tablet 3  . simethicone (MYLICON) 80 MG chewable tablet Chew by mouth.    Marland Kitchen XIIDRA 5 % SOLN Apply to eye. Sample given by eye dr    . desonide (DESOWEN) 0.05 % ointment APPLY TWICE DAILY TO FACE AS DIRECTED    . estradiol (ESTRACE) 0.1 MG/GM vaginal cream Use once a week as needed (Patient not taking: Reported on 11/14/2019) 42.5 g 1  . ibuprofen (ADVIL,MOTRIN) 200 MG tablet Take 200 mg by mouth every 6 (six) hours as needed.    . metroNIDAZOLE (METROCREAM) 0.75 % cream APPLY TO FACE TWICE DAILY WEAR SUNSCREEN EVERY MORNING  No current  facility-administered medications for this visit.     Musculoskeletal: Strength & Muscle Tone: UTA Gait & Station: UTA Patient leans: N/A  Psychiatric Specialty Exam: Review of Systems  Musculoskeletal: Positive for back pain.  Psychiatric/Behavioral: Negative for agitation, behavioral problems, confusion, decreased concentration, dysphoric mood, hallucinations, self-injury, sleep disturbance and suicidal ideas. The patient is not nervous/anxious and is not hyperactive.   All other systems reviewed and are negative.   There were no vitals taken for this visit.There is no height or weight on file to calculate BMI.  General Appearance: UTA  Eye Contact:  UTA  Speech:  Clear and Coherent  Volume:  Normal  Mood:  Euthymic  Affect:  UTA  Thought Process:  Goal Directed and Descriptions of Associations: Intact  Orientation:  Full (Time, Place, and Person)  Thought Content: Logical   Suicidal Thoughts:  No  Homicidal Thoughts:  No  Memory:  Immediate;   Fair Recent;   Fair Remote;   Fair  Judgement:  Fair  Insight:  Fair  Psychomotor Activity:  UTA  Concentration:  Concentration: Fair and Attention Span: Fair  Recall:  [employer] of Knowledge: Fair  Language: Fair  Akathisia:  No  Handed:  Right  AIMS (if indicated): UTA  Assets:  Communication Skills Desire for Improvement Housing Social Support  ADL's:  Intact  Cognition: WNL  Sleep:  Fair   Screenings: Mini-Mental     Clinical Support from 03/06/2019 in [facility] from 03/02/2018 in [facility], [facility]  Total Score (max 30 points )  30  27    PHQ2-9     Clinical Support from 03/06/2019 in [facility], [facility] Office Visit from 11[facility]08/2018 in [facility], [facility] Office Visit from 08[facility]08/2018 in [facility], [facility] Office Visit from 06/30/2018 in [facility], [facility] Office Visit from 05/02/2018 in [facility], [facility]  PHQ-2 Total  Score  0  0  1  0  0       Assessment and Plan: Stori is an 86 year old female, has a history of GAD, MDD, history of psychosis, essential hypertension, chronic pain, OSA, hearing loss, bereavement was evaluated by phone today.  Patient is currently struggling with her own health issues as well as the death of her son recently due to COVID-19 infection.  Patient however overall is coping okay.  Plan as noted below.  Plan GAD-stable Lexapro 20 mg p.o. daily  MDD in remission Lexapro as prescribed  Insomnia-stable Mirtazapine 7.5 to 15 mg p.o. nightly. Continue CPAP  Delusional disorder in remission We will monitor closely  Bereavement-we will monitor closely.  Follow-up in clinic in 1 to 2 months or sooner if needed.  I have spent atleast 19 minutes non face to face with patient today. More than 50 % of the time was spent for preparing to see the patient ( e.g., review of test, records ), ordering medications and test ,psychoeducation and supportive psychotherapy and care coordination,as well as documenting clinical information in electronic health record. This note was generated in part or whole with voice recognition software. Voice recognition is usually quite accurate but there are transcription errors that can and very often do occur. I apologize for any typographical errors that were not detected and corrected.       Ursula Alert, MD 11/14/2019, 12:09 PM

## 2019-11-16 ENCOUNTER — Telehealth: Payer: Self-pay | Admitting: *Deleted

## 2019-11-16 ENCOUNTER — Encounter: Payer: Self-pay | Admitting: Pain Medicine
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
## 2019-11-20 ENCOUNTER — Other Ambulatory Visit: Payer: Self-pay

## 2019-11-20 ENCOUNTER — Ambulatory Visit: Payer: Medicare HMO | Attending: Pain Medicine | Admitting: Pain Medicine

## 2019-11-20 DIAGNOSIS — G8929 Other chronic pain: Secondary | ICD-10-CM | POA: Diagnosis not present

## 2019-11-20 DIAGNOSIS — M159 Polyosteoarthritis, unspecified: Secondary | ICD-10-CM | POA: Insufficient documentation

## 2019-11-20 DIAGNOSIS — M25551 Pain in right hip: Secondary | ICD-10-CM

## 2019-11-20 DIAGNOSIS — M47816 Spondylosis without myelopathy or radiculopathy, lumbar region: Secondary | ICD-10-CM | POA: Diagnosis not present

## 2019-11-20 DIAGNOSIS — M961 Postlaminectomy syndrome, not elsewhere classified: Secondary | ICD-10-CM | POA: Insufficient documentation

## 2019-11-20 DIAGNOSIS — Z79899 Other long term (current) drug therapy: Secondary | ICD-10-CM

## 2019-11-20 DIAGNOSIS — M8949 Other hypertrophic osteoarthropathy, multiple sites: Secondary | ICD-10-CM | POA: Diagnosis not present

## 2019-11-20 DIAGNOSIS — M25552 Pain in left hip: Secondary | ICD-10-CM

## 2019-11-20 DIAGNOSIS — M160 Bilateral primary osteoarthritis of hip: Secondary | ICD-10-CM | POA: Diagnosis not present

## 2019-11-20 DIAGNOSIS — G894 Chronic pain syndrome: Secondary | ICD-10-CM

## 2019-11-20 DIAGNOSIS — M545 Low back pain, unspecified: Secondary | ICD-10-CM

## 2019-11-20 DIAGNOSIS — Z981 Arthrodesis status: Secondary | ICD-10-CM

## 2019-11-20 NOTE — Patient Instructions (Signed)
____________________________________________________________________________________________  Preparing for Procedure with Sedation  Procedure appointments are limited to planned procedures: . No Prescription Refills. . No disability issues will be discussed. . No medication changes will be discussed.  Instructions: . Oral Intake: Do not eat or drink anything for at least 3 hours prior to your procedure. (Exception: Blood Pressure Medication. See below.) . Transportation: Unless otherwise stated by your physician, you may drive yourself after the procedure. . Blood Pressure Medicine: Do not forget to take your blood pressure medicine with a sip of water the morning of the procedure. If your Diastolic (lower reading)is above 100 mmHg, elective cases will be cancelled/rescheduled. . Blood thinners: These will need to be stopped for procedures. Notify our staff if you are taking any blood thinners. Depending on which one you take, there will be specific instructions on how and when to stop it. . Diabetics on insulin: Notify the staff so that you can be scheduled 1st case in the morning. If your diabetes requires high dose insulin, take only  of your normal insulin dose the morning of the procedure and notify the staff that you have done so. . Preventing infections: Shower with an antibacterial soap the morning of your procedure. . Build-up your immune system: Take 1000 mg of Vitamin C with every meal (3 times a day) the day prior to your procedure. . Antibiotics: Inform the staff if you have a condition or reason that requires you to take antibiotics before dental procedures. . Pregnancy: If you are pregnant, call and cancel the procedure. . Sickness: If you have a cold, fever, or any active infections, call and cancel the procedure. . Arrival: You must be in the facility at least 30 minutes prior to your scheduled procedure. . Children: Do not bring children with you. . Dress appropriately:  Bring dark clothing that you would not mind if they get stained. . Valuables: Do not bring any jewelry or valuables.  Reasons to call and reschedule or cancel your procedure: (Following these recommendations will minimize the risk of a serious complication.) . Surgeries: Avoid having procedures within 2 weeks of any surgery. (Avoid for 2 weeks before or after any surgery). . Flu Shots: Avoid having procedures within 2 weeks of a flu shots or . (Avoid for 2 weeks before or after immunizations). . Barium: Avoid having a procedure within 7-10 days after having had a radiological study involving the use of radiological contrast. (Myelograms, Barium swallow or enema study). . Heart attacks: Avoid any elective procedures or surgeries for the initial 6 months after a "Myocardial Infarction" (Heart Attack). . Blood thinners: It is imperative that you stop these medications before procedures. Let us know if you if you take any blood thinner.  . Infection: Avoid procedures during or within two weeks of an infection (including chest colds or gastrointestinal problems). Symptoms associated with infections include: Localized redness, fever, chills, night sweats or profuse sweating, burning sensation when voiding, cough, congestion, stuffiness, runny nose, sore throat, diarrhea, nausea, vomiting, cold or Flu symptoms, recent or current infections. It is specially important if the infection is over the area that we intend to treat. . Heart and lung problems: Symptoms that may suggest an active cardiopulmonary problem include: cough, chest pain, breathing difficulties or shortness of breath, dizziness, ankle swelling, uncontrolled high or unusually low blood pressure, and/or palpitations. If you are experiencing any of these symptoms, cancel your procedure and contact your primary care physician for an evaluation.  Remember:  Regular Business hours are:    Monday to Thursday 8:00 AM to 4:00 PM  Provider's  Schedule: Idriss Quackenbush, MD:  Procedure days: Tuesday and Thursday 7:30 AM to 4:00 PM  Bilal Lateef, MD:  Procedure days: Monday and Wednesday 7:30 AM to 4:00 PM ____________________________________________________________________________________________   ____________________________________________________________________________________________  General Risks and Possible Complications  Patient Responsibilities: It is important that you read this as it is part of your informed consent. It is our duty to inform you of the risks and possible complications associated with treatments offered to you. It is your responsibility as a patient to read this and to ask questions about anything that is not clear or that you believe was not covered in this document.  Patient's Rights: You have the right to refuse treatment. You also have the right to change your mind, even after initially having agreed to have the treatment done. However, under this last option, if you wait until the last second to change your mind, you may be charged for the materials used up to that point.  Introduction: Medicine is not an exact science. Everything in Medicine, including the lack of treatment(s), carries the potential for danger, harm, or loss (which is by definition: Risk). In Medicine, a complication is a secondary problem, condition, or disease that can aggravate an already existing one. All treatments carry the risk of possible complications. The fact that a side effects or complications occurs, does not imply that the treatment was conducted incorrectly. It must be clearly understood that these can happen even when everything is done following the highest safety standards.  No treatment: You can choose not to proceed with the proposed treatment alternative. The "PRO(s)" would include: avoiding the risk of complications associated with the therapy. The "CON(s)" would include: not getting any of the treatment  benefits. These benefits fall under one of three categories: diagnostic; therapeutic; and/or palliative. Diagnostic benefits include: getting information which can ultimately lead to improvement of the disease or symptom(s). Therapeutic benefits are those associated with the successful treatment of the disease. Finally, palliative benefits are those related to the decrease of the primary symptoms, without necessarily curing the condition (example: decreasing the pain from a flare-up of a chronic condition, such as incurable terminal cancer).  General Risks and Complications: These are associated to most interventional treatments. They can occur alone, or in combination. They fall under one of the following six (6) categories: no benefit or worsening of symptoms; bleeding; infection; nerve damage; allergic reactions; and/or death. 1. No benefits or worsening of symptoms: In Medicine there are no guarantees, only probabilities. No healthcare provider can ever guarantee that a medical treatment will work, they can only state the probability that it may. Furthermore, there is always the possibility that the condition may worsen, either directly, or indirectly, as a consequence of the treatment. 2. Bleeding: This is more common if the patient is taking a blood thinner, either prescription or over the counter (example: Goody Powders, Fish oil, Aspirin, Garlic, etc.), or if suffering a condition associated with impaired coagulation (example: Hemophilia, cirrhosis of the liver, low platelet counts, etc.). However, even if you do not have one on these, it can still happen. If you have any of these conditions, or take one of these drugs, make sure to notify your treating physician. 3. Infection: This is more common in patients with a compromised immune system, either due to disease (example: diabetes, cancer, human immunodeficiency virus [HIV], etc.), or due to medications or treatments (example: therapies used to treat  cancer and   rheumatological diseases). However, even if you do not have one on these, it can still happen. If you have any of these conditions, or take one of these drugs, make sure to notify your treating physician. 4. Nerve Damage: This is more common when the treatment is an invasive one, but it can also happen with the use of medications, such as those used in the treatment of cancer. The damage can occur to small secondary nerves, or to large primary ones, such as those in the spinal cord and brain. This damage may be temporary or permanent and it may lead to impairments that can range from temporary numbness to permanent paralysis and/or brain death. 5. Allergic Reactions: Any time a substance or material comes in contact with our body, there is the possibility of an allergic reaction. These can range from a mild skin rash (contact dermatitis) to a severe systemic reaction (anaphylactic reaction), which can result in death. 6. Death: In general, any medical intervention can result in death, most of the time due to an unforeseen complication. ____________________________________________________________________________________________   

## 2019-11-27 ENCOUNTER — Telehealth: Payer: Self-pay

## 2019-11-27 NOTE — Telephone Encounter (Signed)
Insurance is unable to approve the procedure request without a peer to peer. Are you interested in doing that? I can set it up for you.

## 2019-12-04 NOTE — Telephone Encounter (Signed)
Insurance didn't approve the facets. She called back to see what the plan is. Please advise on what you want to do.

## 2019-12-04 NOTE — Telephone Encounter (Signed)
Please advise 

## 2019-12-12 ENCOUNTER — Telehealth: Payer: Self-pay | Admitting: *Deleted

## 2019-12-12 NOTE — Telephone Encounter (Signed)
This still hasn't been addressed. Its been 8 days.

## 2019-12-13 ENCOUNTER — Ambulatory Visit: Payer: Medicare HMO | Admitting: Psychiatry

## 2019-12-13 NOTE — Telephone Encounter (Signed)
Please advise 

## 2019-12-18 ENCOUNTER — Telehealth (INDEPENDENT_AMBULATORY_CARE_PROVIDER_SITE_OTHER): Payer: Medicare HMO | Admitting: Psychiatry

## 2019-12-18 ENCOUNTER — Other Ambulatory Visit: Payer: Self-pay

## 2019-12-18 ENCOUNTER — Encounter: Payer: Self-pay | Admitting: Psychiatry

## 2019-12-18 DIAGNOSIS — Z634 Disappearance and death of family member: Secondary | ICD-10-CM | POA: Diagnosis not present

## 2019-12-18 DIAGNOSIS — F5105 Insomnia due to other mental disorder: Secondary | ICD-10-CM

## 2019-12-18 DIAGNOSIS — F22 Delusional disorders: Secondary | ICD-10-CM

## 2019-12-18 DIAGNOSIS — F411 Generalized anxiety disorder: Secondary | ICD-10-CM

## 2019-12-18 DIAGNOSIS — R69 Illness, unspecified: Secondary | ICD-10-CM | POA: Diagnosis not present

## 2019-12-18 DIAGNOSIS — F3342 Major depressive disorder, recurrent, in full remission: Secondary | ICD-10-CM | POA: Insufficient documentation

## 2019-12-18 NOTE — Progress Notes (Signed)
Provider Location : [facility] Patient Location : [facility]  Virtual Visit via Telephone Note  I connected with Kristen Pennington on 12/18/19 at 10:40 AM EDT by telephone and verified that I am speaking with the correct person using two identifiers.   I discussed the limitations, risks, security and privacy concerns of performing an evaluation and management service by telephone and the availability of in person appointments. I also discussed with the patient that there may be a patient responsible charge related to this service. The patient expressed understanding and agreed to proceed.    I discussed the assessment and treatment plan with the patient. The patient was provided an opportunity to ask questions and all were answered. The patient agreed with the plan and demonstrated an understanding of the instructions.   The patient was advised to call back or seek an in-person evaluation if the symptoms worsen or if the condition fails to improve as anticipated.   [place] MD OP Progress Note  12/18/2019 11:50 AM TARIA CASTRILLO  MRN:  [id number]  Chief Complaint:  Chief Complaint    Follow-up     HPI: KAHLEA Pennington is a 86 year old Caucasian female, widowed, lives in [place], has a history of GAD, MDD, delusional disorder, insomnia, hearing loss, vision loss, OSA, hypertension was evaluated by phone today.  Patient preferred to do a phone call.  Patient today reports she is currently struggling with a lot of pain.  She has back pain, hip joint and knee joint pain.  This is affecting her functioning and the ability to do things for herself.  She however is in a senior living community and does have help available.  She reports she has not asked for help yet and has been trying to manage everything by herself.  She reports she is having trouble prepping her meals and hence has not been eating much.  She however reports she will try to ask for help and also may get some prepackaged meals which  will be easier for her to use.  Patient reports her mood symptoms as under control on the current medication regimen.  She does report that sleep is okay at night however she stays in bed too late .  She reports she stays in bed since she is worried about her pain returning when she gets up.  Patient denies any suicidality, homicidality or perceptual disturbances.  Patient denies any other concerns today.  Visit Diagnosis:    ICD-10-CM   1. Generalized anxiety disorder  F41.1   2. MDD (major depressive disorder), recurrent, in full remission ([place])  F33.42   3. Delusional disorder ([place])  F22   4. Insomnia due to mental condition  F51.05   5. Bereavement  Z63.4     Past Psychiatric History: I have reviewed past psychiatric history from my progress note on 07/25/2018.  Past trials of Klonopin, Abilify, Lexapro, mirtazapine.  Past Medical History:  Past Medical History:  Diagnosis Date  . Arthritis   . Atrophic vaginitis 03/06/2013   Last Assessment & Plan:  She will plan to continue estradiol vaginal cream.  Prescription was rewritten stipulating use of the generic product.   . Atypical migraine 03/06/2013   Last Assessment & Plan:  Since she rarely takes sumatriptan, we will discontinue it. Continue sparing use of OTC migraine products.   . Avitaminosis D 03/06/2013   Last Assessment & Plan:  Recheck vitamin D level. Plan to continue vitamin D supplementation.   . Ceratitis 08/01/2013  Last Assessment & Plan:  Because of the expense, she will discontinue Restasis. She will use over-the-counter moisturizing eyedrops and liquid gel.   . Colon polyp   . 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   . 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   . Cystocele, midline 03/06/2013  . Demoralization and apathy 03/06/2013    Last Assessment & Plan:  Since she has been taking bupropion only once a day, I will write the prescription with the correct instructions and correct quantity. She has done well on this for years and she is encouraged to take it regularly   . Depression   . Environmental allergies   . Epistaxis   . Essential (primary) hypertension 05/17/2013   Last Assessment & Plan:  Her blood pressure is well-controlled. We will plan to continue hydrochlorothiazide and benazepril.  Both are generic and should be affordable on her health plan.   Marland Kitchen GERD (gastroesophageal reflux disease)   . Hemorrhoid   . Inflammation of sacroiliac joint ([place]) 03/06/2013   Last Assessment & Plan:  She is doing well on her present regimen of fentanyl patch supplemented with sparing use of tramadol/acetaminophen. We will continue that regimen.   . Sinusitis   . Sleep apnea    CPAP    Past Surgical History:  Procedure Laterality Date  . COLONOSCOPY  05-03-15  . COLONOSCOPY WITH PROPOFOL N/A 05/03/2015   Procedure: COLONOSCOPY WITH PROPOFOL;  Surgeon: Lucilla Lame, MD;  Location: [place] [facility];  Service: Endoscopy;  Laterality: N/A;  CPAP  . DILATION AND CURETTAGE OF UTERUS  1970  . ENDOMETRIAL BIOPSY    . EYE SURGERY Right 2017  . POLYPECTOMY  05/03/2015   Procedure: POLYPECTOMY;  Surgeon: Lucilla Lame, MD;  Location: [place];  Service: Endoscopy;;  . ROTATOR CUFF REPAIR  2007  . SPINE SURGERY      Family Psychiatric History: I have reviewed family psychiatric history from my progress note on 07/25/2018  Family History:  Family History  Problem Relation Age of Onset  . Arthritis Mother   . Alzheimer's disease Father   . Heart disease Sister   . Cancer Brother   . Ovarian cancer Neg Hx   . Breast cancer Neg Hx   . Colon cancer Neg Hx   . Diabetes Neg Hx     Social History: I have reviewed social history from my progress note on 07/25/2018 Social History   Socioeconomic History  . Marital  status: Widowed    Spouse name: Not on file  . Number of children: 2  . Years of education: Not on file  . Highest education level: Some college, no degree  Occupational History  . Not on file  Tobacco Use  . Smoking status: Never Smoker  . Smokeless tobacco: Never Used  Substance and Sexual Activity  . Alcohol use: No    Alcohol/week: 0.0 standard drinks  . Drug use: No  . Sexual activity: Not Currently  Other Topics Concern  . Not on file  Social History Narrative  . Not on file   Social Determinants of Health   Financial Resource Strain:   . Difficulty of Paying Living Expenses:   Food Insecurity:   . Worried About Charity fundraiser in the Last Year:   . Arboriculturist in  the Last Year:   Transportation Needs:   . Film/video editor (Medical):   Marland Kitchen Lack of Transportation (Non-Medical):   Physical Activity:   . Days of Exercise per Week:   . Minutes of Exercise per Session:   Stress:   . Feeling of Stress :   Social Connections:   . Frequency of Communication with Friends and Family:   . Frequency of Social Gatherings with Friends and Family:   . Attends Religious Services:   . Active Member of Clubs or Organizations:   . Attends Archivist Meetings:   Marland Kitchen Marital Status:     Allergies: No Known Allergies  Metabolic Disorder Labs: Lab Results  Component Value Date   HGBA1C 5.4 08/01/2018   Lab Results  Component Value Date   PROLACTIN 8.1 08/01/2018   Lab Results  Component Value Date   CHOL 245 (H) 08/01/2018   TRIG 187 (H) 08/01/2018   HDL 49 08/01/2018   LDLCALC 159 (H) 08/01/2018   Lab Results  Component Value Date   TSH 2.830 08/10/2018   TSH 5.550 (H) 08/01/2018    Therapeutic Level Labs: No results found for: LITHIUM No results found for: VALPROATE No components found for:  CBMZ  Current Medications: Current Outpatient Medications  Medication Sig Dispense Refill  . acetaminophen (TYLENOL) 500 MG tablet Take 500 mg by  mouth every 6 (six) hours as needed.    . bisoprolol-hydrochlorothiazide (ZIAC) 5-6.25 MG tablet Take 1 tablet by mouth daily. 30 tablet 3  . Calcium Carb-Cholecalciferol 600-800 MG-UNIT TABS Take by mouth.    . Carboxymethylcellulose Sodium (THERATEARS OP) Apply to eye.    . Cholecalciferol (VITAMIN D3) 5000 units TABS Take 5,000 Units by mouth daily.    Marland Kitchen dicyclomine (BENTYL) 10 MG capsule Take 10 mg by mouth 4 (four) times daily -  before meals and at bedtime.    Marland Kitchen escitalopram (LEXAPRO) 20 MG tablet Take 1 tablet (20 mg total) by mouth daily. 90 tablet 0  . estradiol (ESTRACE) 0.1 MG/GM vaginal cream Use once a week as needed 42.5 g 1  . fluticasone (FLONASE) 50 MCG/ACT nasal spray 1 SPRAY IN EACH NOSTRIL ONCE ADAY AS NEEDED 16 g 1  . Liniments (SALONPAS PAIN RELIEF PATCH EX) Apply 1 patch topically 2 (two) times daily.    Marland Kitchen lubiprostone (AMITIZA) 8 MCG capsule Take 1 capsule po bid prn ibs constipation. 60 capsule 3  . meloxicam (MOBIC) 7.5 MG tablet Take 1 tablet (7.5 mg total) by mouth daily. 30 tablet 5  . metroNIDAZOLE (METROCREAM) 0.75 % cream APPLY TO FACE TWICE DAILY WEAR SUNSCREEN EVERY MORNING    . mirtazapine (REMERON) 15 MG tablet Take 1 tablet (15 mg total) by mouth at bedtime. For sleep 90 tablet 0  . Multiple Vitamins-Minerals (CENTRUM SILVER 50+WOMEN) TABS Take by mouth.    . oxybutynin (DITROPAN-XL) 10 MG 24 hr tablet Take 1 tablet (10 mg total) by mouth daily. 30 tablet 3  . pantoprazole (PROTONIX) 40 MG tablet Take 1 tablet (40 mg total) by mouth daily. 90 tablet 1  . promethazine (PHENERGAN) 25 MG tablet Take 25 mg by mouth as needed.     Marland Kitchen rOPINIRole (REQUIP) 1 MG tablet TAKE ONE TABLET 3 TIMES DAILY AS NEEDED FOR RESTLESS LEGS 90 tablet 3  . XIIDRA 5 % SOLN Apply to eye. Sample given by eye dr     No current facility-administered medications for this visit.     Musculoskeletal: Strength & Muscle Tone:  [place]: UTA Patient leans: N/A  Psychiatric  Specialty Exam: Review of Systems  Musculoskeletal: Positive for arthralgias and back pain.       Hip joint pain, knee joint pain  Psychiatric/Behavioral: Positive for sleep disturbance.  All other systems reviewed and are negative.   There were no vitals taken for this visit.There is no height or weight on file to calculate BMI.  General Appearance: UTA  Eye Contact:  UTA  Speech:  Clear and Coherent  Volume:  Normal  Mood:  Euthymic  Affect:  UTA  Thought Process:  Goal Directed and Descriptions of Associations: Intact  Orientation:  Full (Time, Place, and Person)  Thought Content: Logical   Suicidal Thoughts:  No  Homicidal Thoughts:  No  Memory:  Immediate;   Fair Recent;   Fair Remote;   Fair  Judgement:  Fair  Insight:  Fair  Psychomotor Activity:  UTA  Concentration:  Concentration: Fair and Attention Span: Fair  Recall:  [employer] of Knowledge: Fair  Language: Fair  Akathisia:  No  Handed:  Right  AIMS (if indicated):UTA   Assets:  Communication Skills Desire for Improvement Housing Social Support  ADL's:  Intact  Cognition: WNL  Sleep:  Restless due to pain   Screenings: Mini-Mental     Clinical Support from 03/06/2019 in [facility], [place] from 03/02/2018 in [facility], [facility]  Total Score (max 30 points )  30  27    PHQ2-9     Clinical Support from 03/06/2019 in [facility], [facility] Office Visit from 11/08/2018 in [facility], [facility] Office Visit from 08[facility]08/2018 in [facility], [facility] Office Visit from 06/30/2018 in [facility], [facility] Office Visit from 05/02/2018 in [facility], [facility]  PHQ-2 Total Score  0  0  1  0  0       Assessment and Plan: SYMANTHA STEEBER is a 86 year old female who has a history of GAD, MDD, history of psychosis, essential hypertension, chronic pain, OSA, hearing loss, bereavement was evaluated by phone today.  Patient is currently struggling  with pain.  She however otherwise is doing okay with regards to her mood.  Plan as noted below.  Plan GAD-stable Lexapro 20 mg p.o. daily  MDD in remission Lexapro as prescribed  Insomnia-stable Mirtazapine 7.5 to 15 mg p.o. nightly Continue CPAP  Delusional disorder in remission Will monitor closely  Bereavement-we will monitor closely  Patient advised to reach out to her pain provider for management of her pain.   Also writer communicated with her daughter [id number] treatment plan and also that patient is in pain and discussed options in the community that will be helpful for her support wise while she recover.  Follow-up in clinic in 6 to 8 weeks or sooner if needed.  I have spent atleast 20 minutes non face to face with patient today. More than 50 % of the time was spent for preparing to see the patient ( e.g., review of test, records ), obtaining and to review and separately obtained history , ordering medications and test ,psychoeducation and supportive psychotherapy and care coordination,as well as documenting clinical information in electronic health record. This note was generated in part or whole with voice recognition software. Voice recognition is usually quite accurate but there are transcription errors that can and very often do occur. I apologize for any typographical errors that were not detected and corrected.  Ursula Alert, MD 12/18/2019, 11:50 AM

## 2019-12-18 NOTE — Telephone Encounter (Signed)
She has been waiting 2 weeks for [place] to respond on what the plan is, since her insurance denied the procedure. Should I just bring her in for an evaluation?

## 2019-12-19 ENCOUNTER — Telehealth: Payer: Self-pay | Admitting: *Deleted

## 2019-12-19 ENCOUNTER — Ambulatory Visit: Payer: Medicare HMO | Attending: Pain Medicine | Admitting: Pain Medicine

## 2019-12-19 ENCOUNTER — Telehealth: Payer: Self-pay | Admitting: Pain Medicine

## 2019-12-19 DIAGNOSIS — M25551 Pain in right hip: Secondary | ICD-10-CM

## 2019-12-19 DIAGNOSIS — M545 Low back pain, unspecified: Secondary | ICD-10-CM

## 2019-12-19 DIAGNOSIS — M47816 Spondylosis without myelopathy or radiculopathy, lumbar region: Secondary | ICD-10-CM

## 2019-12-19 DIAGNOSIS — M961 Postlaminectomy syndrome, not elsewhere classified: Secondary | ICD-10-CM

## 2019-12-19 DIAGNOSIS — M25552 Pain in left hip: Secondary | ICD-10-CM | POA: Diagnosis not present

## 2019-12-19 DIAGNOSIS — M5136 Other intervertebral disc degeneration, lumbar region: Secondary | ICD-10-CM

## 2019-12-19 DIAGNOSIS — G8929 Other chronic pain: Secondary | ICD-10-CM

## 2019-12-19 NOTE — Progress Notes (Signed)
Patient: Kristen Pennington  Service Category: E/M  Provider: Gaspar Cola, MD  DOB: 1929-04-08  DOS: 12/19/2019  Location: Office  MRN: [id number]  Setting: Ambulatory outpatient  Referring Provider: Lavera Guise, MD  Type: Established Patient  Specialty: Interventional Pain Management  PCP: Lavera Guise, MD  Location: Remote location  Delivery: TeleHealth     Virtual Encounter - Pain Management PROVIDER NOTE: Information contained herein reflects review and annotations entered in association with encounter. Interpretation of such information and data should be left to medically-trained personnel. Information provided to patient can be located elsewhere in the medical record under "Patient Instructions". Document created using STT-dictation technology, any transcriptional errors that may result from process are unintentional.    Contact & Pharmacy Preferred: [phone] Home: [phone] (home) Mobile: [phone] (mobile) E-mail: [email or web address] .[place], [place] - [address] [place] [address] Phone: [phone] Fax: [phone]   Pre-screening  Kristen Pennington offered "in-person" vs "virtual" encounter. She indicated preferring virtual for this encounter.   Reason COVID-19*  Social distancing based on CDC and AMA recommendations.   The first attempted call today was at 8:41 AM but the patient did not pick up the call and it went to voicemail.  A voicemail was left for the patient indicating that we would be calling her later on today.  I was finally able to contact the patient at 11:15 AM today, on my second attempt.  I contacted Kristen Pennington on 12/19/2019 via telephone.      I clearly identified myself as Gaspar Cola, MD. I verified that I was speaking with the correct person using two identifiers (Name: CAMDYNN MARANTO, and date of birth: [birth year]).  Consent I sought verbal advanced consent from Kristen Pennington for virtual visit interactions. I informed Ms. Zylka of possible security and privacy concerns, risks, and limitations associated with providing "not-in-person" medical evaluation and management services. I also informed Ms. Haycraft of the availability of "in-person" appointments. Finally, I informed her that there would be a charge for the virtual visit and that she could be  personally, fully or partially, financially responsible for it. Ms. Shippee expressed understanding and agreed to proceed.   Historic Elements   Kristen Pennington is a 86 y.o. year old, female patient evaluated today after her last contact with our practice on 12/12/2019. 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 She also  has a past surgical history that includes Spine surgery; Rotator cuff repair (2007); Dilation and curettage of uterus (1970); Colonoscopy with propofol (N/A, 05/03/2015); polypectomy (05/03/2015); Colonoscopy (05-03-15); Eye surgery (Right, 2017); and Endometrial biopsy. Kristen Pennington has a current medication list which includes the following prescription(s): acetaminophen, bisoprolol-hydrochlorothiazide, calcium carb-cholecalciferol, carboxymethylcellulose sodium, vitamin d3, dicyclomine, escitalopram, estradiol, fluticasone, menthol-methyl salicylate, lubiprostone, meloxicam, metronidazole, mirtazapine, centrum silver 50+women, oxybutynin,  pantoprazole, promethazine, ropinirole, and xiidra. She  reports that she has never smoked. She has never used smokeless tobacco. She reports that she does not drink alcohol or use drugs. Kristen Pennington has No Known Allergies.   HPI  Today, she is being contacted for follow-up evaluation to  try to come up with a treatment plan since the patient's healthcare insurance has denied the patient's treatment.  Recent x-rays of the lumbar spine and hips done on 10/26/2019.  The x-rays of the lumbar spine show evidence of a posterior lumbar fusion.  According to the patient her primary pain is that of the lower back, bilaterally.  She indicates having had physical therapy 3 to 4 years ago at [facility] and [address] [place].  The patient's last MRI was done in 2008 and she seems to think that her surgery was also done around that time by an orthopedic surgeon in [place].  The patient indicates that she was taking some pain medicine for this, but between her son, daughter, and her primary care physician apparently they decided to discontinue the medication due to the way that it was affecting her.  When she had to physical therapy 3 to 4 years ago she indicated that this did help her pain and therefore today I will be placing a referral for physical therapy of her lumbar spine.  In addition, I will be ordering a repeat MRI with and without contrast to see if were dealing with a problem involving epidural fibrosis.  In addition, I will schedule the patient to come in to complete her physical exam since I am sure that this is part of the reason why they decided to not approve her diagnostic lumbar facet block.  Pharmacotherapy Assessment  Analgesic: Tramadol 50 mg, 1 tab PO BID  Highest recorded MME/day: 10 mg/day MME/day: 10 mg/day   Monitoring: [place] PMP: PDMP reviewed during this encounter.       Pharmacotherapy: No side-effects or adverse reactions reported. Compliance: No problems  identified. Effectiveness: Clinically acceptable. Plan: Refer to "POC".  UDS: No results found for: SUMMARY  Laboratory Chemistry Profile   Renal Lab Results  Component Value Date   BUN 22 08/10/2018   CREATININE 1.21 (H) 08/10/2018   BCR 18 08/10/2018   GFRAA 46 (L) 08/10/2018   GFRNONAA 40 (L) 08/10/2018     Hepatic No results found for: AST, ALT, ALBUMIN, ALKPHOS, HCVAB, AMYLASE, LIPASE, AMMONIA   Electrolytes Lab Results  Component Value Date   NA 143 08/10/2018   K 5.6 (H) 08/10/2018   CL 101 08/10/2018   CALCIUM 10.3 08/10/2018     Bone Lab Results  Component Value Date   VD25OH 57.1 08/10/2018   25OHVITD1 80 10/26/2019   25OHVITD2 <1.0 10/26/2019   25OHVITD3 80 10/26/2019     Inflammation (CRP: Acute Phase) (ESR: Chronic Phase) Lab Results  Component Value Date   CRP <0.5 10/26/2019   ESRSEDRATE 2 10/26/2019       Note: Above Lab results reviewed.  Imaging  DG Lumbar Spine Complete W/Bend CLINICAL DATA:  Chronic low back pain and bilateral hip pain  EXAM: LUMBAR SPINE - COMPLETE WITH BENDING VIEWS  COMPARISON:  08/11/2018  FINDINGS: Posterolateral rod and pedicle screw fixation at L4-5 with 8 mm of chronic anterolisthesis at this level. Continued reduced disc height at L2-3. Bony demineralization. Aortoiliac atherosclerotic vascular disease.  Continued reduction of intervertebral disc height at L5-S1. Suspected right laminectomy at L4.  Flexion and extension views were performed without abnormal motion at L4-5 or elsewhere in the lumbar spine. Both flexion and extension appear to be relatively limited.  IMPRESSION: 1. Prior L4-5 PLIF with 8 mm of chronic anterolisthesis at this level. No dynamic instability is identified on the flexion and extension views. 2. Bony demineralization. 3.  Degenerative disc disease at L2-3 and L5-S1. 4. Aortoiliac atherosclerotic vascular disease. 5. Both flexion and extension appear to be relatively  limited. No abnormal motion with flexion or extension, I suspect that the facet joints at L4-5 are solidly fused.  Electronically Signed   By: Van Clines M.D.   On: 10/27/2019 19:36     Assessment  The primary encounter diagnosis was Chronic low back pain (1ry area of Pain) (Bilateral) (R>L) w/o sciatica. Diagnoses of Lumbar facet syndrome (Bilateral), Other intervertebral disc degeneration, lumbar region, Failed back surgical syndrome, and Chronic hip pain (2ry area of Pain) (Bilateral) (R>L) were also pertinent to this visit.  Plan of Care  Problem-specific:  No problem-specific Assessment & Plan notes found for this encounter.  Ms. SHATIRA DOBOSZ has a current medication list which includes the following long-term medication(s): bisoprolol-hydrochlorothiazide, dicyclomine, escitalopram, fluticasone, mirtazapine, pantoprazole, and ropinirole.  Pharmacotherapy (Medications Ordered): No orders of the defined types were placed in this encounter.  Orders:  Orders Placed This Encounter  Procedures  . MR LUMBAR SPINE W WO CONTRAST    Patient presents with axial pain with possible radicular component.  In addition to any acute findings, please report on:  1. Facet (Zygapophyseal) joint DJD (Hypertrophy, space narrowing, subchondral sclerosis, and/or osteophyte formation) 2. DDD and/or IVDD (Loss of disc height, desiccation or "Black disc disease") 3. Pars defects 4. Spondylolisthesis, spondylosis, and/or spondyloarthropathies (include Degree/Grade of displacement in mm) 5. Vertebral body Fractures, including age (old, new/acute) 70. Modic Type Changes 7. Demineralization 8. Bone pathology 9. Central, Lateral Recess, and/or Foraminal Stenosis (include AP diameter of stenosis in mm) 10. Surgical changes (hardware type, status, and presence of fibrosis) NOTE: Please specify level(s) and laterality.    Standing Status:   Future    Standing Expiration Date:   03/20/2020     Order Specific Question:   If indicated for the ordered procedure, I authorize the administration of contrast media per Radiology protocol    Answer:   Yes    Order Specific Question:   What is the patient's sedation requirement?    Answer:   No Sedation    Order Specific Question:   Does the patient have a pacemaker or implanted devices?    Answer:   No    Order Specific Question:   Call Results- Best Contact Number?    Answer:   [phone] ([facility])    Order Specific Question:   Radiology Contrast Protocol - do NOT remove file path    Answer:   [email or web address]    Order Specific Question:   Preferred imaging location?    Answer:   [facility] (table limit-350lbs)  . Ambulatory referral to Physical Therapy    Referral Priority:   Routine    Referral Type:   Physical Medicine    Referral Reason:   Specialty Services Required    Requested Specialty:   Physical Therapy    Number of Visits Requested:   1   Follow-up plan:   Return for (F2F), E/M to complete physical exam.      Interventional management options: Planned, scheduled, and/or pending:    Diagnostic bilateral lumbar facet block #1 (Initially denied by insurance)   Considering:   Diagnostic bilateral lumbar facet block  Diagnostic bilateral SI joint block  Diagnostic bilateral hip joint injection  Diagnostic midline L3-4 LESI  Diagnostic midline caudal ESI    PRN Procedures:   None at this time    Recent Visits Date Type Provider  Dept  11/20/19 Telemedicine Milinda Pointer, MD [facility]  Showing recent visits within past 90 days and meeting all other requirements   Today's Visits Date Type Provider Dept  12/19/19 Telemedicine Milinda Pointer, MD [facility]  Showing today's visits and meeting all other requirements   Future Appointments No visits were found meeting these conditions.  Showing future appointments within next 90 days and  meeting all other requirements   I discussed the assessment and treatment plan with the patient. The patient was provided an opportunity to ask questions and all were answered. The patient agreed with the plan and demonstrated an understanding of the instructions.  Patient advised to call back or seek an in-person evaluation if the symptoms or condition worsens.  Duration of encounter: 18 minutes.  Note by: Gaspar Cola, MD Date: 12/19/2019; Time: 12:25 PM

## 2019-12-19 NOTE — Telephone Encounter (Signed)
Patient scheduled for today. VV

## 2019-12-19 NOTE — Telephone Encounter (Signed)
Patient Kristen Pennington at 12:36 stating she spoke with Dr. Dossie Arbour for her appt this morning. She hadnt been up very long and wasn't hurting to bad. After she got up and has been moving around she is hurting so bad she just cant stand it. She needs something more than tylenol to help her pain. Please call

## 2019-12-19 NOTE — Telephone Encounter (Signed)
Spoke with patient, states she had VV with Dr Dossie Arbour this morning and failed to ask him for pain medication.  She said she was feeling okay at the time but has pain that causes her to "suffer" and she needs pain relief for that.  She can't take ibuprofen. Takes Tylenol for the pain.  She states her PCP took all of her medication away, clonopin and pain medication because her children were saying it caused her to act funny.  She feels that she needs pain medication back. I told her I would let Dr Dossie Arbour know and let her know his response.  She is agreeable to this.

## 2019-12-20 NOTE — Progress Notes (Signed)
Her daughter's name is Museum/gallery curator. Her # is [phone] and she can be reached before 11am or after 5 pm.

## 2019-12-20 NOTE — Telephone Encounter (Signed)
Read my note. She did tell me, but her daughter and PCP took her off of these Medicines because of problems with them. I will not restart a problem.

## 2019-12-26 ENCOUNTER — Ambulatory Visit: Payer: Medicare HMO | Attending: Pain Medicine

## 2019-12-26 ENCOUNTER — Telehealth: Payer: Self-pay | Admitting: *Deleted

## 2019-12-26 ENCOUNTER — Other Ambulatory Visit: Payer: Self-pay

## 2019-12-26 DIAGNOSIS — M6281 Muscle weakness (generalized): Secondary | ICD-10-CM | POA: Insufficient documentation

## 2019-12-26 DIAGNOSIS — M545 Low back pain: Secondary | ICD-10-CM | POA: Insufficient documentation

## 2019-12-26 DIAGNOSIS — G8929 Other chronic pain: Secondary | ICD-10-CM | POA: Diagnosis not present

## 2019-12-26 NOTE — Telephone Encounter (Signed)
Please order
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
## 2019-12-26 NOTE — Patient Instructions (Signed)
Seated hip extension isometrics   Sitting on a chair,    Squeeze your rear end muscles together and press your Left  foot onto the floor.     Hold for 5 seconds    Repeat 5 times   Perform 3 sets daily.     Perform morning, afternoon, evening.     LEFT foot only

## 2019-12-28 ENCOUNTER — Ambulatory Visit: Payer: Medicare HMO

## 2019-12-28 ENCOUNTER — Other Ambulatory Visit: Payer: Self-pay

## 2019-12-28 DIAGNOSIS — M6281 Muscle weakness (generalized): Secondary | ICD-10-CM

## 2019-12-28 DIAGNOSIS — G8929 Other chronic pain: Secondary | ICD-10-CM

## 2019-12-28 DIAGNOSIS — M545 Low back pain: Secondary | ICD-10-CM | POA: Diagnosis not present

## 2019-12-28 NOTE — Patient Instructions (Signed)
Access Code: [email or web address] URL: [email or web address] Date: 12/28/2019 Prepared by: Joneen Boers  Exercises Seated Transversus Abdominis Bracing - 1 x daily - 7 x weekly - 3 sets - 10 reps - 5 seconds hold Seated Gluteal Sets - 1 x daily - 7 x weekly - 3 sets - 10 reps - 5 seconds hold
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
## 2020-01-02 ENCOUNTER — Other Ambulatory Visit: Payer: Self-pay

## 2020-01-02 ENCOUNTER — Ambulatory Visit: Payer: Medicare HMO

## 2020-01-02 DIAGNOSIS — M545 Low back pain: Secondary | ICD-10-CM | POA: Diagnosis not present

## 2020-01-02 DIAGNOSIS — G8929 Other chronic pain: Secondary | ICD-10-CM

## 2020-01-02 DIAGNOSIS — M6281 Muscle weakness (generalized): Secondary | ICD-10-CM
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
## 2020-01-05 ENCOUNTER — Telehealth: Payer: Self-pay

## 2020-01-05 NOTE — Telephone Encounter (Signed)
Called lmom informing patient of appointment on 01/09/2020. klh °

## 2020-01-05 NOTE — Telephone Encounter (Signed)
Patient rescheduled appointment on 01/09/2020 to 01/22/2020. klh

## 2020-01-08 ENCOUNTER — Other Ambulatory Visit: Payer: Self-pay

## 2020-01-08 ENCOUNTER — Ambulatory Visit
Admission: RE | Admit: 2020-01-08 | Discharge: 2020-01-08 | Disposition: A | Discharge status: Home / Self Care | Payer: Medicare HMO | Source: Ambulatory Visit | Attending: Pain Medicine | Admitting: Pain Medicine

## 2020-01-08 DIAGNOSIS — G8929 Other chronic pain: Secondary | ICD-10-CM | POA: Diagnosis present

## 2020-01-08 DIAGNOSIS — M545 Low back pain, unspecified: Secondary | ICD-10-CM

## 2020-01-08 DIAGNOSIS — M47816 Spondylosis without myelopathy or radiculopathy, lumbar region: Secondary | ICD-10-CM | POA: Insufficient documentation

## 2020-01-08 DIAGNOSIS — M25551 Pain in right hip: Secondary | ICD-10-CM | POA: Insufficient documentation

## 2020-01-08 DIAGNOSIS — M25552 Pain in left hip: Secondary | ICD-10-CM | POA: Insufficient documentation

## 2020-01-08 DIAGNOSIS — M961 Postlaminectomy syndrome, not elsewhere classified: Secondary | ICD-10-CM | POA: Diagnosis present

## 2020-01-08 DIAGNOSIS — M5126 Other intervertebral disc displacement, lumbar region: Secondary | ICD-10-CM | POA: Diagnosis not present

## 2020-01-08 DIAGNOSIS — M5136 Other intervertebral disc degeneration, lumbar region: Secondary | ICD-10-CM | POA: Diagnosis present

## 2020-01-08 MED ORDER — GADOBUTROL 1 MMOL/ML IV SOLN
7.0000 mL | Freq: Once | INTRAVENOUS | Status: AC | PRN
  Administered 2020-01-08: 7 mL via INTRAVENOUS

## 2020-01-09 ENCOUNTER — Ambulatory Visit: Payer: Medicare HMO | Admitting: Internal Medicine

## 2020-01-09 ENCOUNTER — Ambulatory Visit: Payer: Medicare HMO

## 2020-01-09 DIAGNOSIS — M545 Low back pain, unspecified: Secondary | ICD-10-CM

## 2020-01-09 DIAGNOSIS — G8929 Other chronic pain: Secondary | ICD-10-CM

## 2020-01-09 DIAGNOSIS — M6281 Muscle weakness (generalized): Secondary | ICD-10-CM
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
## 2020-01-09 NOTE — Patient Instructions (Signed)
Seated hip extension isometrics   Sitting on a chair,               Squeeze your rear end muscles together and press your Left  foot onto the floor.                Hold for 5 seconds               Repeat 5 times              Perform 3 sets daily.                Perform morning, afternoon, evening.     LEFT foot only

## 2020-01-11 ENCOUNTER — Ambulatory Visit: Payer: Medicare HMO

## 2020-01-16 ENCOUNTER — Ambulatory Visit: Payer: Medicare HMO

## 2020-01-17 ENCOUNTER — Telehealth: Payer: Self-pay | Admitting: *Deleted

## 2020-01-17 ENCOUNTER — Telehealth: Payer: Self-pay

## 2020-01-17 NOTE — Telephone Encounter (Signed)
pt called left a message that she needs some pain medication and that she cant get in to see them. she states that she needs something for the pain.

## 2020-01-17 NOTE — Telephone Encounter (Signed)
Is she eligible to receive Toradol/Norflex?

## 2020-01-17 NOTE — Telephone Encounter (Signed)
Please let patient know we do not prescribe pain medications. Please ask to contact primary care.

## 2020-01-17 NOTE — Telephone Encounter (Signed)
left message that we do not prescribe pain medications that this office. pt was advised to contact PCP, pain clinic or go to the ER.

## 2020-01-18 ENCOUNTER — Telehealth: Payer: Self-pay

## 2020-01-18 NOTE — Telephone Encounter (Signed)
Confirmed appointment on 01/22/2020 and screened for covid. kl[facility] 

## 2020-01-19 ENCOUNTER — Ambulatory Visit (INDEPENDENT_AMBULATORY_CARE_PROVIDER_SITE_OTHER): Payer: Medicare HMO | Admitting: Nurse Practitioner

## 2020-01-19 ENCOUNTER — Encounter: Payer: Self-pay | Admitting: Nurse Practitioner

## 2020-01-19 ENCOUNTER — Other Ambulatory Visit: Payer: Self-pay

## 2020-01-19 ENCOUNTER — Telehealth: Payer: Self-pay

## 2020-01-19 VITALS — BP 134/59 | HR 60 | Temp 97.2°F | Resp 16 | Ht 65.0 in | Wt 155.2 lb

## 2020-01-19 DIAGNOSIS — I10 Essential (primary) hypertension: Secondary | ICD-10-CM | POA: Diagnosis not present

## 2020-01-19 DIAGNOSIS — M47816 Spondylosis without myelopathy or radiculopathy, lumbar region: Secondary | ICD-10-CM

## 2020-01-19 DIAGNOSIS — R69 Illness, unspecified: Secondary | ICD-10-CM | POA: Diagnosis not present

## 2020-01-19 DIAGNOSIS — F3341 Major depressive disorder, recurrent, in partial remission: Secondary | ICD-10-CM

## 2020-01-19 MED ORDER — FENTANYL 12 MCG/HR TD PT72: MEDICATED_PATCH | TRANSDERMAL | 0 refills | Status: DC

## 2020-01-19 NOTE — Progress Notes (Signed)
[facility] [place], [place] [address]  Internal MEDICINE  Office Visit Note  Patient Name: Kristen Pennington  [id number]  [id number]  Date of Service: 01/24/2020  Chief Complaint  Patient presents with  . Follow-up    Extreme pain in back/spine and hip area; needs pain medication  . Depression  . Gastroesophageal Reflux  . Hypertension  . Hyperlipidemia    states she is having a lot of pain in her lower back which radiates to her hips. Pain can be so severe, it is hard to get moving out of bed in the mornings.she states that she is still experiencing chronic severe low back and hip pain impacting her ability to complete her ADLs. She describes the pain as non-radiating and aching with a severity of 8-9/10. The pain is continuous and is exacerbated by bending.  She currently takes two extra strength tylenol in the mornings and sometimes, she will take it again in the afternoon. This used to be effective, but is helping less and less. She states that the only time she was getting complete pain relief was when she was taking an opioid, but that her daughter requested that it be discontinued. Her medication list contained both fentanyl patch nad tramadol/APAP. The patient does not remember being on fentanyl patch at all. Went back and reviewed all narcotic prescriptions. Last time she was given a prescription for fentanyl patch was in 2016. She does not remember if this was effective as she does not remember having it. She has had a few visits with Dr. Georgana Curio for pain management. She recently had MRI of her lumbar spine and she will have follow up with him to review these results. He has yet to prescribe her any medication for pain. Reviewed PDMP.  Blood pressure remains stable.       Current Medication: Outpatient Encounter Medications as of 01/19/2020  Medication Sig  . acetaminophen (TYLENOL) 500 MG tablet Take 500 mg by mouth every 6 (six) hours as needed.  .  bisoprolol-hydrochlorothiazide (ZIAC) 5-6.25 MG tablet Take 1 tablet by mouth daily.  . Calcium Carb-Cholecalciferol 600-800 MG-UNIT TABS Take by mouth.  . Carboxymethylcellulose Sodium (THERATEARS OP) Apply to eye.  . Cholecalciferol (VITAMIN D3) 5000 units TABS Take 5,000 Units by mouth daily.  Marland Kitchen dicyclomine (BENTYL) 10 MG capsule Take 10 mg by mouth 4 (four) times daily -  before meals and at bedtime.  Marland Kitchen escitalopram (LEXAPRO) 20 MG tablet Take 1 tablet (20 mg total) by mouth daily.  Marland Kitchen estradiol (ESTRACE) 0.1 MG/GM vaginal cream Use once a week as needed  . fluticasone (FLONASE) 50 MCG/ACT nasal spray 1 SPRAY IN EACH NOSTRIL ONCE ADAY AS NEEDED  . Liniments (SALONPAS PAIN RELIEF PATCH EX) Apply 1 patch topically 2 (two) times daily.  Marland Kitchen lubiprostone (AMITIZA) 8 MCG capsule Take 1 capsule po bid prn ibs constipation.  . meloxicam (MOBIC) 7.5 MG tablet Take 1 tablet (7.5 mg total) by mouth daily.  . metroNIDAZOLE (METROCREAM) 0.75 % cream APPLY TO FACE TWICE DAILY WEAR SUNSCREEN EVERY MORNING  . mirtazapine (REMERON) 15 MG tablet Take 1 tablet (15 mg total) by mouth at bedtime. For sleep  . Multiple Vitamins-Minerals (CENTRUM SILVER 50+WOMEN) TABS Take by mouth.  . oxybutynin (DITROPAN-XL) 10 MG 24 hr tablet Take 1 tablet (10 mg total) by mouth daily.  . pantoprazole (PROTONIX) 40 MG tablet Take 1 tablet (40 mg total) by mouth daily.  . promethazine (PHENERGAN) 25 MG tablet Take 25 mg  by mouth as needed.   Marland Kitchen rOPINIRole (REQUIP) 1 MG tablet TAKE ONE TABLET 3 TIMES DAILY AS NEEDED FOR RESTLESS LEGS  . XIIDRA 5 % SOLN Apply to eye. Sample given by eye dr  . fentaNYL (DURAGESIC) 12 MCG/HR Apply 1 patch to skin and leave on for 72 hours. Apply new patch after 72 hours, removing the old patch.   No facility-administered encounter medications on file as of 01/19/2020.    Surgical History: Past Surgical History:  Procedure Laterality Date  . COLONOSCOPY  05-03-15  . COLONOSCOPY WITH PROPOFOL N/A  05/03/2015   Procedure: COLONOSCOPY WITH PROPOFOL;  Surgeon: Lucilla Lame, MD;  Location: [facility];  Service: Endoscopy;  Laterality: N/A;  CPAP  . DILATION AND CURETTAGE OF UTERUS  1970  . ENDOMETRIAL BIOPSY    . EYE SURGERY Right 2017  . POLYPECTOMY  05/03/2015   Procedure: POLYPECTOMY;  Surgeon: Lucilla Lame, MD;  Location: [facility];  Service: Endoscopy;;  . ROTATOR CUFF REPAIR  2007  . SPINE SURGERY      Medical History: Past Medical History:  Diagnosis Date  . Arthritis   . Atrophic vaginitis 03/06/2013   Last Assessment & Plan:  She will plan to continue estradiol vaginal cream.  Prescription was rewritten stipulating use of the generic product.   . Atypical migraine 03/06/2013   Last Assessment & Plan:  Since she rarely takes sumatriptan, we will discontinue it. Continue sparing use of OTC migraine products.   . Avitaminosis D 03/06/2013   Last Assessment & Plan:  Recheck vitamin D level. Plan to continue vitamin D supplementation.   . Ceratitis 08/01/2013   Last Assessment & Plan:  Because of the expense, she will discontinue Restasis. She will use over-the-counter moisturizing eyedrops and liquid gel.   . Colon polyp   . 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   . 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   . Cystocele, midline 03/06/2013  . Demoralization and apathy 03/06/2013   Last Assessment & Plan:  Since she has been taking bupropion only once a day, I will write the prescription with the correct instructions and correct quantity. She has done well on this for years and she is encouraged to take it regularly   . Depression   . Environmental allergies   . Epistaxis   . Essential (primary) hypertension 05/17/2013   Last Assessment & Plan:  Her blood pressure is well-controlled.  We will plan to continue hydrochlorothiazide and benazepril.  Both are generic and should be affordable on her health plan.   Marland Kitchen GERD (gastroesophageal reflux disease)   . Hemorrhoid   . Inflammation of sacroiliac joint ([place]) 03/06/2013   Last Assessment & Plan:  She is doing well on her present regimen of fentanyl patch supplemented with sparing use of tramadol/acetaminophen. We will continue that regimen.   . Sinusitis   . Sleep apnea    CPAP    Family History: Family History  Problem Relation Age of Onset  . Arthritis Mother   . Alzheimer's disease Father   . Heart disease Sister   . Cancer Brother   . Ovarian cancer Neg Hx   . Breast cancer Neg Hx   . Colon cancer Neg Hx   . Diabetes Neg Hx     Social History  Socioeconomic History  . Marital status: Widowed    Spouse name: Not on file  . Number of children: 2  . Years of education: Not on file  . Highest education level: Some college, no degree  Occupational History  . Not on file  Tobacco Use  . Smoking status: Never Smoker  . Smokeless tobacco: Never Used  Vaping Use  . Vaping Use: Never used  Substance and Sexual Activity  . Alcohol use: No    Alcohol/week: 0.0 standard drinks  . Drug use: No  . Sexual activity: Not Currently  Other Topics Concern  . Not on file  Social History Narrative  . Not on file   Social Determinants of Health   Financial Resource Strain:   . Difficulty of Paying Living Expenses:   Food Insecurity:   . Worried About Charity fundraiser in the Last Year:   . Arboriculturist in the Last Year:   Transportation Needs:   . Film/video editor (Medical):   Marland Kitchen Lack of Transportation (Non-Medical):   Physical Activity:   . Days of Exercise per Week:   . Minutes of Exercise per Session:   Stress:   . Feeling of Stress :   Social Connections:   . Frequency of Communication with Friends and Family:   . Frequency of Social Gatherings with Friends and Family:   . Attends Religious  Services:   . Active Member of Clubs or Organizations:   . Attends Archivist Meetings:   Marland Kitchen Marital Status:   Intimate Partner Violence:   . Fear of Current or Ex-Partner:   . Emotionally Abused:   Marland Kitchen Physically Abused:   . Sexually Abused:       Review of Systems  Constitutional: Positive for activity change and fatigue. Negative for chills and unexpected weight change.  HENT: Negative for congestion, postnasal drip, rhinorrhea, sinus pressure, sinus pain, sneezing and sore throat.   Respiratory: Negative for cough, chest tightness, shortness of breath and wheezing.   Cardiovascular: Negative for chest pain and palpitations.  Gastrointestinal: Negative for abdominal pain, constipation, diarrhea, nausea and vomiting.  Endocrine: Negative for cold intolerance, heat intolerance, polydipsia and polyuria.  Musculoskeletal: Positive for arthralgias, back pain, gait problem and myalgias. Negative for joint swelling and neck pain.       Severe lower back pain radiating into the hips.Started back in September. Has gradually become worse, now to the point where she struggles with her ADLs as pain is so severe.  Skin: Negative for rash.  Neurological: Positive for weakness and headaches. Negative for tremors and numbness.  Hematological: Negative for adenopathy. Does not bruise/bleed easily.  Psychiatric/Behavioral: Negative for behavioral problems (Depression), sleep disturbance and suicidal ideas. The patient is nervous/anxious.        Patient is routinely seeing psychiatry.    Today's Vitals   01/19/20 0833  BP: (!) 134/59  Pulse: 60  Resp: 16  Temp: (!) 97.2 F (36.2 C)  SpO2: 95%  Weight: 155 lb 3.2 oz (70.4 kg)  Height: 5\' 5"  (1.651 m)   Body mass index is 25.83 kg/m.  Physical Exam Vitals and nursing note reviewed.  Constitutional:      General: She is not in acute distress.    Appearance: Normal appearance. She is well-developed. She is not diaphoretic.  HENT:      Head: Normocephalic and atraumatic.     Nose: Nose normal.     Mouth/Throat:     Pharynx: No  oropharyngeal exudate.  Eyes:     Pupils: Pupils are equal, round, and reactive to light.  Neck:     Thyroid: No thyromegaly.     Vascular: No JVD.     Trachea: No tracheal deviation.  Cardiovascular:     Rate and Rhythm: Normal rate and regular rhythm.     Heart sounds: Normal heart sounds. No murmur heard.  No friction rub. No gallop.   Pulmonary:     Effort: Pulmonary effort is normal. No respiratory distress.     Breath sounds: Normal breath sounds. No wheezing or rales.  Chest:     Chest wall: No tenderness.  Abdominal:     Palpations: Abdomen is soft.  Musculoskeletal:        General: Normal range of motion.     Cervical back: Normal range of motion and neck supple.     Comments: She does have moderate lower back pain, worse with palpation and worse with sitting or standing for long periods of time. She is using a walking cane to help with mobility.   Lymphadenopathy:     Cervical: No cervical adenopathy.  Skin:    General: Skin is warm and dry.  Neurological:     Mental Status: She is alert and oriented to person, place, and time. Mental status is at baseline.     Cranial Nerves: No cranial nerve deficit.  Psychiatric:        Attention and Perception: Attention and perception normal.        Mood and Affect: Mood is anxious.        Speech: Speech normal.        Behavior: Behavior normal. Behavior is cooperative.        Thought Content: Thought content normal.        Cognition and Memory: Cognition and memory normal.        Judgment: Judgment normal.    Assessment/Plan:  1. Essential (primary) hypertension Stable. Continue bp medication as prescribed   2. Lumbar facet joint syndrome Trial fentanyl 12.35mcg/hour transdermal patch. Apply one patch to skin and leave for three days. Apply new patch every three days, removing old patch at that time. The patient voiced  understanding of the instructions.  - fentaNYL (DURAGESIC) 12 MCG/HR; Apply 1 patch to skin and leave on for 72 hours. Apply new patch after 72 hours, removing the old patch.  Dispense: 10 patch; Refill: 0  3. MDD (major depressive disorder), recurrent, in partial remission [facility]) Patient should continue with regular visits to psychiatry as scheduled.    General Counseling: rhaya coale understanding of the findings of todays visit and agrees with plan of treatment. I have discussed any further diagnostic evaluation that may be needed or ordered today. We also reviewed her medications today. she has been encouraged to call the office with any questions or concerns that should arise related to todays visit.   Reviewed risks and possible side effects associated with taking opiates, benzodiazepines and other CNS depressants. Combination of these could cause dizziness and drowsiness. Advised patient not to drive or operate machinery when taking these medications, as patient's and other's life can be at risk and will have consequences. Patient verbalized understanding in this matter. Dependence and abuse for these drugs will be monitored closely. A Controlled substance policy and procedure is on file which allows [facility] to order a urine drug screen test at any visit. Patient understands and agrees with the plan  This patient was seen  by Leretha Pol FNP Collaboration with Dr Lavera Guise as a part of collaborative care agreement  Meds ordered this encounter  Medications  . fentaNYL (DURAGESIC) 12 MCG/HR    Sig: Apply 1 patch to skin and leave on for 72 hours. Apply new patch after 72 hours, removing the old patch.    Dispense:  10 patch    Refill:  0    This is chronic pain situation.    Order Specific Question:   Supervising Provider    Answer:   Lavera Guise [[id number]]    Total time spent: 30 Minutes  Time spent includes review of chart, medications, test results, and follow  up plan with the patient.      Dr Lavera Guise Internal medicine

## 2020-01-19 NOTE — Telephone Encounter (Signed)
Authorization for Fentanyl Patch 72HR was approved from 07/14/19 through 07/12/2020 [facility]

## 2020-01-22 ENCOUNTER — Ambulatory Visit: Payer: Medicare HMO | Admitting: Internal Medicine

## 2020-01-22 ENCOUNTER — Encounter: Payer: Self-pay | Admitting: Internal Medicine

## 2020-01-22 ENCOUNTER — Other Ambulatory Visit: Payer: Self-pay

## 2020-01-22 VITALS — BP 159/80 | HR 66 | Temp 97.4°F | Resp 16 | Ht 65.0 in | Wt 152.8 lb

## 2020-01-22 DIAGNOSIS — K219 Gastro-esophageal reflux disease without esophagitis: Secondary | ICD-10-CM | POA: Diagnosis not present

## 2020-01-22 DIAGNOSIS — G4733 Obstructive sleep apnea (adult) (pediatric): Secondary | ICD-10-CM

## 2020-01-22 DIAGNOSIS — J301 Allergic rhinitis due to pollen: Secondary | ICD-10-CM | POA: Diagnosis not present

## 2020-01-22 DIAGNOSIS — G894 Chronic pain syndrome: Secondary | ICD-10-CM | POA: Diagnosis not present

## 2020-01-22 NOTE — Patient Instructions (Signed)

## 2020-01-22 NOTE — Progress Notes (Signed)
[facility] [place], [place] [address]  Pulmonary Sleep Medicine   Office Visit Note  Patient Name: Kristen Pennington DOB: 03-13-1929 MRN [id number]  Date of Service: 01/22/2020  Complaints/HPI: Has been having some issues with confusion. She has been on chronic pain. Patients children were concerned about her behavior. Patient apparently was also forgetting her passwords. She is on fentanyl patch also. She is talking about hackers hacking her computers. She states that she recently lost her son also after a long illness. She also does go to pain management. Patient was told to see psychiatry but she refused to go see a psychiatrist. Patient states now she is seeing a psychiatrist. As far as her sleep is concerned she is doing well. Patient has been using her CPAP per her however has been non compliant based on downloads. Apparently her CPAP was declined because of her non-compliance. She was recently given a pain patch by the NP  ROS  General: (-) fever, (-) chills, (-) night sweats, (-) weakness Skin: (-) rashes, (-) itching,. Eyes: (-) visual changes, (-) redness, (-) itching. Nose and Sinuses: (-) nasal stuffiness or itchiness, (-) postnasal drip, (-) nosebleeds, (-) sinus trouble. Mouth and Throat: (-) sore throat, (-) hoarseness. Neck: (-) swollen glands, (-) enlarged thyroid, (-) neck pain. Respiratory: - cough, (-) bloody sputum, - shortness of breath, - wheezing. Cardiovascular: - ankle swelling, (-) chest pain. Lymphatic: (-) lymph node enlargement. Neurologic: (-) numbness, (-) tingling. Psychiatric: (-) anxiety, (-) depression   Current Medication: Outpatient Encounter Medications as of 01/22/2020  Medication Sig  . acetaminophen (TYLENOL) 500 MG tablet Take 500 mg by mouth every 6 (six) hours as needed.  . bisoprolol-hydrochlorothiazide (ZIAC) 5-6.25 MG tablet Take 1 tablet by mouth daily.  . Calcium Carb-Cholecalciferol 600-800 MG-UNIT TABS  Take by mouth.  . Carboxymethylcellulose Sodium (THERATEARS OP) Apply to eye.  . Cholecalciferol (VITAMIN D3) 5000 units TABS Take 5,000 Units by mouth daily.  Marland Kitchen dicyclomine (BENTYL) 10 MG capsule Take 10 mg by mouth 4 (four) times daily -  before meals and at bedtime.  Marland Kitchen escitalopram (LEXAPRO) 20 MG tablet Take 1 tablet (20 mg total) by mouth daily.  Marland Kitchen estradiol (ESTRACE) 0.1 MG/GM vaginal cream Use once a week as needed  . fentaNYL (DURAGESIC) 12 MCG/HR Apply 1 patch to skin and leave on for 72 hours. Apply new patch after 72 hours, removing the old patch.  . fluticasone (FLONASE) 50 MCG/ACT nasal spray 1 SPRAY IN EACH NOSTRIL ONCE ADAY AS NEEDED  . Liniments (SALONPAS PAIN RELIEF PATCH EX) Apply 1 patch topically 2 (two) times daily.  Marland Kitchen lubiprostone (AMITIZA) 8 MCG capsule Take 1 capsule po bid prn ibs constipation.  . meloxicam (MOBIC) 7.5 MG tablet Take 1 tablet (7.5 mg total) by mouth daily.  . metroNIDAZOLE (METROCREAM) 0.75 % cream APPLY TO FACE TWICE DAILY WEAR SUNSCREEN EVERY MORNING  . mirtazapine (REMERON) 15 MG tablet Take 1 tablet (15 mg total) by mouth at bedtime. For sleep  . Multiple Vitamins-Minerals (CENTRUM SILVER 50+WOMEN) TABS Take by mouth.  . oxybutynin (DITROPAN-XL) 10 MG 24 hr tablet Take 1 tablet (10 mg total) by mouth daily.  . pantoprazole (PROTONIX) 40 MG tablet Take 1 tablet (40 mg total) by mouth daily.  . promethazine (PHENERGAN) 25 MG tablet Take 25 mg by mouth as needed.   Marland Kitchen rOPINIRole (REQUIP) 1 MG tablet TAKE ONE TABLET 3 TIMES DAILY AS NEEDED FOR RESTLESS LEGS  . XIIDRA 5 % SOLN  Apply to eye. Sample given by eye dr   No facility-administered encounter medications on file as of 01/22/2020.    Surgical History: Past Surgical History:  Procedure Laterality Date  . COLONOSCOPY  05-03-15  . COLONOSCOPY WITH PROPOFOL N/A 05/03/2015   Procedure: COLONOSCOPY WITH PROPOFOL;  Surgeon: Lucilla Lame, MD;  Location: [facility];  Service: Endoscopy;   Laterality: N/A;  CPAP  . DILATION AND CURETTAGE OF UTERUS  1970  . ENDOMETRIAL BIOPSY    . EYE SURGERY Right 2017  . POLYPECTOMY  05/03/2015   Procedure: POLYPECTOMY;  Surgeon: Lucilla Lame, MD;  Location: [facility];  Service: Endoscopy;;  . ROTATOR CUFF REPAIR  2007  . SPINE SURGERY      Medical History: Past Medical History:  Diagnosis Date  . Arthritis   . Atrophic vaginitis 03/06/2013   Last Assessment & Plan:  She will plan to continue estradiol vaginal cream.  Prescription was rewritten stipulating use of the generic product.   . Atypical migraine 03/06/2013   Last Assessment & Plan:  Since she rarely takes sumatriptan, we will discontinue it. Continue sparing use of OTC migraine products.   . Avitaminosis D 03/06/2013   Last Assessment & Plan:  Recheck vitamin D level. Plan to continue vitamin D supplementation.   . Ceratitis 08/01/2013   Last Assessment & Plan:  Because of the expense, she will discontinue Restasis. She will use over-the-counter moisturizing eyedrops and liquid gel.   . Colon polyp   . 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   . 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   . Cystocele, midline 03/06/2013  . Demoralization and apathy 03/06/2013   Last Assessment & Plan:  Since she has been taking bupropion only once a day, I will write the prescription with the correct instructions and correct quantity. She has done well on this for years and she is encouraged to take it regularly   . Depression   . Environmental allergies   . Epistaxis   . Essential (primary) hypertension 05/17/2013   Last Assessment & Plan:  Her blood pressure is well-controlled. We will plan to continue hydrochlorothiazide and benazepril.  Both are generic and should be affordable on her health plan.   Marland Kitchen  GERD (gastroesophageal reflux disease)   . Hemorrhoid   . Inflammation of sacroiliac joint ([place]) 03/06/2013   Last Assessment & Plan:  She is doing well on her present regimen of fentanyl patch supplemented with sparing use of tramadol/acetaminophen. We will continue that regimen.   . Sinusitis   . Sleep apnea    CPAP    Family History: Family History  Problem Relation Age of Onset  . Arthritis Mother   . Alzheimer's disease Father   . Heart disease Sister   . Cancer Brother   . Ovarian cancer Neg Hx   . Breast cancer Neg Hx   . Colon cancer Neg Hx   . Diabetes Neg Hx     Social History: Social History   Socioeconomic History  . Marital status: Widowed    Spouse name: Not on file  . Number of children: 2  . Years of education: Not on file  . Highest education level: Some college, no degree  Occupational History  . Not on file  Tobacco Use  . Smoking status: Never  Smoker  . Smokeless tobacco: Never Used  Vaping Use  . Vaping Use: Never used  Substance and Sexual Activity  . Alcohol use: No    Alcohol/week: 0.0 standard drinks  . Drug use: No  . Sexual activity: Not Currently  Other Topics Concern  . Not on file  Social History Narrative  . Not on file   Social Determinants of Health   Financial Resource Strain:   . Difficulty of Paying Living Expenses:   Food Insecurity:   . Worried About Charity fundraiser in the Last Year:   . Arboriculturist in the Last Year:   Transportation Needs:   . Film/video editor (Medical):   Marland Kitchen Lack of Transportation (Non-Medical):   Physical Activity:   . Days of Exercise per Week:   . Minutes of Exercise per Session:   Stress:   . Feeling of Stress :   Social Connections:   . Frequency of Communication with Friends and Family:   . Frequency of Social Gatherings with Friends and Family:   . Attends Religious Services:   . Active Member of Clubs or Organizations:   . Attends Archivist Meetings:   Marland Kitchen Marital  Status:   Intimate Partner Violence:   . Fear of Current or Ex-Partner:   . Emotionally Abused:   Marland Kitchen Physically Abused:   . Sexually Abused:     Vital Signs: Blood pressure (!) 159/80, pulse 66, temperature (!) 97.4 F (36.3 C), resp. rate 16, height 5\' 5"  (1.651 m), weight 152 lb 12.8 oz (69.3 kg), SpO2 97 %.  Examination: General Appearance: The patient is well-developed, well-nourished, and in no distress. Skin: Gross inspection of skin unremarkable. Head: normocephalic, no gross deformities. Eyes: no gross deformities noted. ENT: ears appear grossly normal no exudates. Neck: Supple. No thyromegaly. No LAD. Respiratory: no ronchi noted. Cardiovascular: Normal S1 and S2 without murmur or rub. Extremities: No cyanosis. pulses are equal. Neurologic: Alert and oriented. No involuntary movements.  LABS: Recent Results (from the past 2160 hour(s))  C-reactive protein     Status: None   Collection Time: 10/26/19 11:13 AM  Result Value Ref Range   CRP <0.5 <1.0 mg/dL    Comment: Performed at [facility], [place] [address] [place], [place] [address]  25-Hydroxy vitamin D Lcms D2+D3     Status: None   Collection Time: 10/26/19 11:13 AM  Result Value Ref Range   25-Hydroxy, Vitamin D 80 ng/mL    Comment: (NOTE) Reference Range: All Ages: Target levels 30 - 100    25-Hydroxy, Vitamin D-2 <1.0 ng/mL    Comment: (NOTE) This test was developed and its performance characteristics determined by [facility]. It has not been cleared or approved by the Food and Drug Administration.    25-Hydroxy, Vitamin D-3 80 ng/mL    Comment: (NOTE) This test was developed and its performance characteristics determined by [facility]. It has not been cleared or approved by the Food and Drug Administration. Performed At: [place], [place] [id number] Jake Bathe F MD [id number]   Sedimentation rate     Status: None   Collection Time: 10/26/19 11:13 AM   Result Value Ref Range   Sed Rate 2 0 - 30 mm/hr    Comment: Performed at [facility], [address] [place], [place] [address]  Vitamin B12     Status: None   Collection Time: 10/26/19 11:13 AM  Result Value  Ref Range   Vitamin B-12 460 180 - 914 pg/mL    Comment: (NOTE) This assay is not validated for testing neonatal or myeloproliferative syndrome specimens for Vitamin B12 levels. Performed at [facility], [place] [address], [place], [place] [address]     Radiology: MR LUMBAR SPINE W WO CONTRAST  Result Date: 01/08/2020 CLINICAL DATA:  Lumbosacral pain. Osteoarthritis. Back surgery 2008 EXAM: MRI LUMBAR SPINE WITHOUT AND WITH CONTRAST TECHNIQUE: Multiplanar and multiecho pulse sequences of the lumbar spine were obtained without and with intravenous contrast. CONTRAST:  38mL GADAVIST GADOBUTROL 1 MMOL/ML IV SOLN COMPARISON:  Lumbar radiographs 10/26/2019 FINDINGS: Segmentation:  Normal Alignment: Mild anterolisthesis L2-3. 5 mm anterolisthesis L4-5 with bilateral pedicle screw fusion. Vertebrae: Negative for fracture or mass. Prominent basal vertebral plexus T12, L1, L2. Conus medullaris and cauda equina: Conus extends to the T12-L1 level. Conus and cauda equina appear normal. Paraspinal and other soft tissues: Negative for paraspinous mass or adenopathy. No fluid collection. Disc levels: L1-2: Mild disc bulging and mild facet degeneration. Negative for stenosis. L2-3: Moderate disc degeneration with disc bulging and endplate spurring. Bilateral facet hypertrophy. Mild subarticular stenosis bilaterally. L3-4: Mild disc degeneration and disc bulging. Mild facet degeneration. Negative for stenosis L4-5: 4 mm anterolisthesis. Bilateral pedicle screw fusion. Posterior decompression. Negative for spinal or foraminal stenosis L5-S1: Mild disc degeneration.  Negative for stenosis. IMPRESSION: Multilevel degenerative change in the lumbar spine. Pedicle screw fusion at L4-5 No  significant spinal stenosis. Electronically Signed   By: Franchot Gallo M.D.   On: 01/08/2020 15:18    No results found.  MR LUMBAR SPINE W WO CONTRAST  Result Date: 01/08/2020 CLINICAL DATA:  Lumbosacral pain. Osteoarthritis. Back surgery 2008 EXAM: MRI LUMBAR SPINE WITHOUT AND WITH CONTRAST TECHNIQUE: Multiplanar and multiecho pulse sequences of the lumbar spine were obtained without and with intravenous contrast. CONTRAST:  62mL GADAVIST GADOBUTROL 1 MMOL/ML IV SOLN COMPARISON:  Lumbar radiographs 10/26/2019 FINDINGS: Segmentation:  Normal Alignment: Mild anterolisthesis L2-3. 5 mm anterolisthesis L4-5 with bilateral pedicle screw fusion. Vertebrae: Negative for fracture or mass. Prominent basal vertebral plexus T12, L1, L2. Conus medullaris and cauda equina: Conus extends to the T12-L1 level. Conus and cauda equina appear normal. Paraspinal and other soft tissues: Negative for paraspinous mass or adenopathy. No fluid collection. Disc levels: L1-2: Mild disc bulging and mild facet degeneration. Negative for stenosis. L2-3: Moderate disc degeneration with disc bulging and endplate spurring. Bilateral facet hypertrophy. Mild subarticular stenosis bilaterally. L3-4: Mild disc degeneration and disc bulging. Mild facet degeneration. Negative for stenosis L4-5: 4 mm anterolisthesis. Bilateral pedicle screw fusion. Posterior decompression. Negative for spinal or foraminal stenosis L5-S1: Mild disc degeneration.  Negative for stenosis. IMPRESSION: Multilevel degenerative change in the lumbar spine. Pedicle screw fusion at L4-5 No significant spinal stenosis. Electronically Signed   By: Franchot Gallo M.D.   On: 01/08/2020 15:18      Assessment and Plan: Patient Active Problem List   Diagnosis Date Noted  . MDD (major depressive disorder), recurrent, in full remission ([place]) 12/18/2019  . Osteoarthritis of hips (Bilateral) 11/20/2019  . Lumbar facet arthropathy 11/20/2019  . Failed back surgical syndrome  11/20/2019  . Primary osteoarthritis involving multiple joints 11/20/2019  . OSA (obstructive sleep apnea) 10/26/2019  . MDD (major depressive disorder), recurrent, in partial remission ([place]) 09/19/2019  . Bereavement 09/19/2019  . Chronic pain syndrome 08/21/2019  . Pharmacologic therapy 08/21/2019  . Disorder of skeletal system 08/21/2019  . Problems influencing health status 08/21/2019  .  History of lumbar spinal fusion 08/21/2019  . Chronic hip pain (2ry area of Pain) (Bilateral) (R>L) 08/21/2019  . Lumbar facet syndrome (Bilateral) 08/21/2019  . Dysuria 07/02/2019  . Delusional disorder ([place]) 03/08/2019  . MDD (major depressive disorder), recurrent episode, mild ([place]) 01/18/2019  . 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 History of psychosis 01/18/2019  . Gastroesophageal reflux disease without esophagitis 11/27/2018  . Irritable bowel syndrome with constipation 11/27/2018  . Chronic low back pain (1ry area of Pain) (Bilateral) (R>L) w/o sciatica 08/17/2018  . Vitamin B12 deficiency 08/17/2018  . Elevated TSH 08/17/2018  . Urinary tract infection without hematuria 08/16/2017  . Generalized anxiety disorder 08/16/2017  . Primary generalized (osteo)arthritis 08/16/2017  . Melena 12/05/2015  . Blood in stool   . Benign neoplasm of transverse colon   . Benign neoplasm of cecum   . Ceratitis 08/01/2013  . Keratitis sicca ([place]) 08/01/2013  . Essential (primary) hypertension 05/17/2013  . Cystocele, midline 03/06/2013  . Demoralization and apathy 03/06/2013  . Atypical migraine 03/06/2013  . Hyperlipidemia 03/06/2013  . Jittery 03/06/2013  . 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 Atrophic vaginitis 03/06/2013  . Inflammation of sacroiliac joint ([place]) 03/06/2013  . Avitaminosis D 03/06/2013  . Allergic rhinitis due to pollen 03/06/2013  . Body mass index between 19-24, adult 03/06/2013  . Breast screening 03/06/2013  . Other extrapyramidal disease and  abnormal movement disorder 03/06/2013  . Screening for depression 03/06/2013    1. OSA non compliant with her CPAP therapy. Thjis has been declined by [employer].  She has not been compliant with recommended therapy.  She is not likely to be compliant even now and so therefore we will forego any further sleep testing and evaluation. 2. Chronic Pain syndrome. She has been seeing pain management but now is getting medications from the NP I will discuss with the PCP regarding her pain meds. Apparently no future pain management appointments have been made.  There are a lot of psychosocial issues going on she needs to also continue to follow with her psychologist and she needs to also continue to follow with her pain management physician. 3. GERD no change at this time will monitor 4. Allergic rhinitis controlled  General Counseling: I have discussed the findings of the evaluation and examination with Kristen Pennington.  I have also discussed any further diagnostic evaluation thatmay be needed or ordered today. Kristen Pennington verbalizes understanding of the findings of todays visit. We also reviewed her medications today and discussed drug interactions and side effects including but not limited excessive drowsiness and altered mental states. We also discussed that there is always a risk not just to her but also people around her. she has been encouraged to call the office with any questions or concerns that should arise related to todays visit.  No orders of the defined types were placed in this encounter.    Time spent: 70min  I have personally obtained a history, examined the patient, evaluated laboratory and imaging results, formulated the assessment and plan and placed orders.    Allyne Gee, MD [facility] Pulmonary and Critical Care Sleep medicine

## 2020-02-12 ENCOUNTER — Other Ambulatory Visit: Payer: Self-pay | Admitting: Psychiatry

## 2020-02-12 DIAGNOSIS — F5105 Insomnia due to other mental disorder: Secondary | ICD-10-CM

## 2020-02-12 DIAGNOSIS — F411 Generalized anxiety disorder: Secondary | ICD-10-CM

## 2020-02-12 DIAGNOSIS — F3342 Major depressive disorder, recurrent, in full remission: Secondary | ICD-10-CM

## 2020-02-13 ENCOUNTER — Other Ambulatory Visit: Payer: Self-pay

## 2020-02-13 DIAGNOSIS — I10 Essential (primary) hypertension: Secondary | ICD-10-CM

## 2020-02-13 MED ORDER — OXYBUTYNIN CHLORIDE ER 10 MG PO TB24: 10.0000 mg | ORAL_TABLET | Freq: Every day | ORAL | 2 refills | Status: DC

## 2020-02-13 MED ORDER — MELOXICAM 7.5 MG PO TABS: 7.5000 mg | ORAL_TABLET | Freq: Every day | ORAL | 5 refills | Status: DC

## 2020-02-13 MED ORDER — ROPINIROLE HCL 1 MG PO TABS: ORAL_TABLET | ORAL | 3 refills | Status: DC

## 2020-02-13 MED ORDER — BISOPROLOL-HYDROCHLOROTHIAZIDE 5-6.25 MG PO TABS: 1.0000 | ORAL_TABLET | Freq: Every day | ORAL | 3 refills | Status: DC

## 2020-02-13 NOTE — Progress Notes (Signed)
PROVIDER NOTE: Information contained herein reflects review and annotations entered in association with encounter. Interpretation of such information and data should be left to medically-trained personnel. Information provided to patient can be located elsewhere in the medical record under "Patient Instructions". Document created using STT-dictation technology, any transcriptional errors that may result from process are unintentional.    Patient: Kristen Pennington  Service Category: E/M  Provider: Gaspar Cola, MD  DOB: [birth year]  DOS: 02/14/2020  Specialty: Interventional Pain Management  MRN: [id number]  Setting: Ambulatory outpatient  PCP: Lavera Guise, MD  Type: Established Patient    Referring Provider: Lavera Guise, MD  Location: Office  Delivery: Face-to-face     HPI  Reason for encounter: [name], a 86 y.o. year old female, is here today for evaluation and management of her Chronic pain syndrome [G89.4]. Kristen Pennington's primary complain today is Back Pain (lower) Last encounter: Practice (01/17/2020). My last encounter with her was on 12/19/2019. 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 Pain Assessment: Severity of Chronic pain is reported as a 8 /10. Location: Back Lower/bilateral thighs. Onset: More than a month ago.  Quality: Aching. Timing: Constant. Modifying factor(s): rest, tylenol. Vitals:  height is _0  (1.676 m) and weight is 159 lb (72.1 kg). Her temporal temperature is 97.4 F (36.3 C) (abnormal). Her blood pressure is 149/88 (abnormal) and her pulse is 64. Her respiration is 16 and oxygen saturation is 97%.   We have had several encounters with this patient since the Covid pandemic started. In fact, her initial evaluation was on 08/21/2019. However, this is the first face-to-face encounter. Today I took the opportunity to do physical exam on the patient. The patient had a recent MRI of the lumbar spine done on 01/08/2020 which I have reviewed today and can be seen below. The patient has clear evidence of a prior lumbar surgery. Physical exam today was positive bilaterally for hip arthropathy with decreased range of motion and arthralgia as a response to a provocative figure-of-four maneuver.  Interestingly, the patient tells me that she is not really interested in medications, but on the same breath she was very insistent that I gave her "something for pain". Since I had already reviewed the patient's PMP I knew that she was on a Duragesic patch. She indicated that this was not really taking away all of her pain. This gave me an opportunity to talk to the patient about our goals and making sure that she understood that a realistic goal would be to lower the pain by 40% and not to think of success as have not had 100% relief of the pain. This is not realistic and lieu of the patient's long history of back problems and prior surgery. In addition to this, I also found it interesting that based on the PMP and the amount of pain patches that she should have had, she really did not need any refills until 02/20/2020. However, she was insistent that she was on her very last  patch and that she needed to be refilled today. When I did the math with her, then she realized that she was wrong and she "suddenly remembered" that  she had "lost" a patch. Unfortunately, these are red flags to me. Therefore I will need to keep a very close eye on her compliance.  Pharmacotherapy Assessment   Analgesic: No opioid analgesics prescribed by our practice. Duragesic 12.5 mcg/h every 72 hours (prescribed by Leretha Pol, NP Highest recorded MME/day: 28.8 mg/day MME/day: 28.8 mg/day   Monitoring: [place] PMP: PDMP reviewed during this encounter.       Pharmacotherapy: No side-effects or adverse reactions reported. Compliance: No problems identified. Effectiveness: Clinically acceptable.  Kristen Fischer, RN  02/14/2020 10:49 AM  Sign when Signing Visit Safety precautions to be maintained throughout the outpatient stay will include: orient to surroundings, keep bed in low position, maintain call bell within reach at all times, provide assistance with transfer out of bed and ambulation.     UDS:  Summary  Date Value Ref Range Status  02/14/2020 Note  Final    Comment:    ==================================================================== Compliance Drug Analysis, Ur ==================================================================== Test                             Result       Flag       Units  Drug Present and Declared for Prescription Verification   Fentanyl                       6            EXPECTED   ng/mg creat   Norfentanyl                    32           EXPECTED   ng/mg creat    Source of fentanyl is a scheduled prescription medication, including    IV, patch, and transmucosal formulations. Norfentanyl is an expected    metabolite of fentanyl.    Citalopram                     PRESENT      EXPECTED   Desmethylcitalopram            PRESENT      EXPECTED    Desmethylcitalopram is an expected metabolite of citalopram or the    enantiomeric form, escitalopram.    Mirtazapine                    PRESENT      EXPECTED   Acetaminophen                  PRESENT      EXPECTED  Drug Absent but Declared for  Prescription Verification   Promethazine                   Not Detected UNEXPECTED ==================================================================== Test                      Result    Flag   Units      Ref Range   Creatinine              199              mg/dL      >=20 ==================================================================== Declared Medications:  The flagging  and interpretation on this report are based on the  following declared medications.  Unexpected results may arise from  inaccuracies in the declared medications.   **Note: The testing scope of this panel includes these medications:   Escitalopram (Lexapro)  Fentanyl (Duragesic)  Mirtazapine (Remeron)  Promethazine (Phenergan)   **Note: The testing scope of this panel does not include small to  moderate amounts of these reported medications:   Acetaminophen (Tylenol)   **Note: The testing scope of this panel does not include the  following reported medications:   Bisoprolol  Dicyclomine (Bentyl)  Estradiol (Estrace)  Eye Drop  Fluticasone (Flonase)  Hydrochlorothiazide  Lubiprostone (Amitiza)  Meloxicam (Mobic)  Multivitamin (Centrum)  Oxybutynin (Ditropan)  Pantoprazole (Protonix)  Ropinirole (Requip)  Simethicone  Topical  Vitamin D3 ==================================================================== For clinical consultation, please call [phone]. ====================================================================      ROS  Constitutional: Denies any fever or chills Gastrointestinal: No reported hemesis, hematochezia, vomiting, or acute GI distress Musculoskeletal: Denies any acute onset joint swelling, redness, loss of ROM, or weakness Neurological: No reported episodes of acute onset apraxia, aphasia, dysarthria, agnosia, amnesia, paralysis, loss of coordination, or loss of consciousness  Medication Review  Carboxymethylcellulose Sodium, Centrum Silver 50+Women, Lifitegrast,  Menthol-Methyl Salicylate, Polyethyl Glycol-Propyl Glycol, Vitamin D3, acetaminophen, bisoprolol-hydrochlorothiazide, dicyclomine, escitalopram, estradiol, fentaNYL, fluticasone, lubiprostone, meloxicam, mirtazapine, oxybutynin, pantoprazole, promethazine, rOPINIRole, and simethicone  History Review  Allergy: Ms. Fritzsche has No Known Allergies. Drug: Ms. Knoebel  reports no history of drug use. Alcohol:  reports no history of alcohol use. Tobacco:  reports that she has never smoked. She has never used smokeless tobacco. Social: Ms. Viviani  reports that she has never smoked. She has never used smokeless tobacco. She reports that she does not drink alcohol and does not use drugs. 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 Surgical: Ms. Holford  has a past surgical history that includes Spine surgery; Rotator cuff repair (2007); Dilation and curettage of uterus (1970); Colonoscopy with propofol (N/A, 05/03/2015); polypectomy (05/03/2015); Colonoscopy (05-03-15); Eye surgery (Right, 2017); and Endometrial biopsy. Family: family history includes Alzheimer's disease in her father; Arthritis in her mother; Cancer in her brother; Heart disease in her sister.  Laboratory Chemistry Profile   Renal Lab Results  Component Value Date   BUN 19 02/14/2020   CREATININE 1.12 (H) 02/14/2020   BCR 17 02/14/2020   GFRAA 50 (L) 02/14/2020   GFRNONAA 44 (L)  02/14/2020     Hepatic Lab Results  Component Value Date   AST 29 02/14/2020   ALBUMIN 4.9 (H) 02/14/2020   ALKPHOS 76 02/14/2020     Electrolytes Lab Results  Component Value Date   NA 144 02/14/2020   K 5.3 (H) 02/14/2020   CL 103 02/14/2020   CALCIUM 10.4 (H) 02/14/2020   MG 2.3 02/14/2020     Bone Lab Results  Component Value Date   VD25OH 57.1 08/10/2018   25OHVITD1 WILL FOLLOW 02/14/2020   25OHVITD2 WILL FOLLOW 02/14/2020   25OHVITD3 WILL FOLLOW 02/14/2020     Inflammation (CRP: Acute Phase) (ESR: Chronic Phase) Lab Results  Component Value Date   CRP <0.5 10/26/2019   ESRSEDRATE 2 10/26/2019  Note: Above Lab results reviewed.  Recent Imaging Review  MR LUMBAR SPINE W WO CONTRAST CLINICAL DATA:  Lumbosacral pain. Osteoarthritis. Back surgery 2008  EXAM: MRI LUMBAR SPINE WITHOUT AND WITH CONTRAST  TECHNIQUE: Multiplanar and multiecho pulse sequences of the lumbar spine were obtained without and with intravenous contrast.  CONTRAST:  71m GADAVIST GADOBUTROL 1 MMOL/ML IV SOLN  COMPARISON:  Lumbar radiographs 10/26/2019  FINDINGS: Segmentation:  Normal  Alignment: Mild anterolisthesis L2-3. 5 mm anterolisthesis L4-5 with bilateral pedicle screw fusion.  Vertebrae: Negative for fracture or mass. Prominent basal vertebral plexus T12, L1, L2.  Conus medullaris and cauda equina: Conus extends to the T12-L1 level. Conus and cauda equina appear normal.  Paraspinal and other soft tissues: Negative for paraspinous mass or adenopathy. No fluid collection.  Disc levels:  L1-2: Mild disc bulging and mild facet degeneration. Negative for stenosis.  L2-3: Moderate disc degeneration with disc bulging and endplate spurring. Bilateral facet hypertrophy. Mild subarticular stenosis bilaterally.  L3-4: Mild disc degeneration and disc bulging. Mild facet degeneration. Negative for stenosis  L4-5: 4 mm anterolisthesis. Bilateral pedicle screw  fusion. Posterior decompression. Negative for spinal or foraminal stenosis  L5-S1: Mild disc degeneration.  Negative for stenosis.  IMPRESSION: Multilevel degenerative change in the lumbar spine. Pedicle screw fusion at L4-5  No significant spinal stenosis.  Electronically Signed   By: CFranchot GalloM.D.   On: 01/08/2020 15:18 Note: Reviewed        Physical Exam  General appearance: Well nourished, well developed, and well hydrated. In no apparent acute distress Mental status: Alert, oriented x 3 (person, place, & time)       Respiratory: No evidence of acute respiratory distress Eyes: PERLA Vitals: BP (!) 149/88    Pulse 64    Temp (!) 97.4 F (36.3 C) (Temporal)    Resp 16    Ht _0  (1.676 m)    Wt 159 lb (72.1 kg)    SpO2 97%    BMI 25.66 kg/m  BMI: Estimated body mass index is 25.66 kg/m as calculated from the following:   Height as of this encounter: _1  (1.676 m).   Weight as of this encounter: 159 lb (72.1 kg). Ideal: Ideal body weight: 59.3 kg (130 lb 11.7 oz) Adjusted ideal body weight: 64.4 kg (142 lb 0.6 oz)  Assessment   Status Diagnosis  Controlled Controlled Controlled 1. Chronic pain syndrome   2. Chronic low back pain (1ry area of Pain) (Bilateral) (R>L) w/o sciatica   3. Chronic hip pain (2ry area of Pain) (Bilateral) (R>L)   4. Failed back surgical syndrome   5. Abnormal MRI, lumbar spine (01/08/2020)   6. Lumbar facet hypertrophy   7. Lumbar facet syndrome (Bilateral)   8. Pharmacologic therapy   9. Disorder of skeletal system   10. Problems influencing health status   11. Lumbar facet joint syndrome      Updated Problems: Problem  Osa (Obstructive Sleep Apnea)  Mdd (Major Depressive Disorder), Recurrent, in Full Remission (Hcc)  Mdd (Major Depressive Disorder), Recurrent, in Partial Remission (Hcc)   stable   Bereavement    Plan of Care  Problem-specific:  No problem-specific Assessment & Plan notes found for this encounter.  Ms.  JKAMEKO HUKILLhas a current medication list which includes the following long-term medication(s): bisoprolol-hydrochlorothiazide, dicyclomine, escitalopram, fentanyl, fluticasone, mirtazapine, pantoprazole, and ropinirole.  Pharmacotherapy (Medications Ordered): Meds ordered this encounter  Medications   fentaNYL (DURAGESIC) 12 MCG/HR    Sig: Place  1 patch onto the skin every 3 (three) days. Must last 30 days.    Dispense:  10 patch    Refill:  0    Chronic Pain: STOP Act (Not applicable) Fill 1 day early if closed on refill date. Do not fill until: 02/15/2020. To last until: 03/16/2020. Avoid benzodiazepines within 8 hours of opioids   Orders:  Orders Placed This Encounter  Procedures   HIP INJECTION    Standing Status:   Future    Standing Expiration Date:   05/16/2020    Scheduling Instructions:     Side: Bilateral     Sedation: Patient's choice.     Timeframe: As soon as schedule allows   Compliance Drug Analysis, Ur    Volume: 30 ml(s). Minimum 3 ml of urine is needed. Document temperature of fresh sample. Indications: Long term (current) use of opiate analgesic (P95.093) Test#: [id number] (Comprehensive Profile)    Order Specific Question:   Release to patient    Answer:   Immediate   Comp. Metabolic Panel (12)    With GFR. Indications: Chronic Pain Syndrome (G89.4) & Pharmacotherapy (P80.998)    Order Specific Question:   Has the patient fasted?    Answer:   No    Order Specific Question:   CC Results    Answer:   PCP-NURSE [[id number]]    Order Specific Question:   Release to patient    Answer:   Immediate   Magnesium    Indication: Pharmacologic therapy (Z79.899)    Order Specific Question:   CC Results    Answer:   PCP-NURSE [[id number]]    Order Specific Question:   Release to patient    Answer:   Immediate   25-Hydroxy vitamin D Lcms D2+D3    Indication: Disorder of skeletal system (M89.9).    Order Specific Question:   CC Results    Answer:   HAL-PFXTK [[id number]]     Order Specific Question:   Release to patient    Answer:   Immediate   Nursing Instructions:    1. Please arrange for the patient to have blood work done today.  2. Inform patient that return appointment is contingent on timely Lab work completion. 3. Remind patient that Lab orders have an expiration date. After such date, the system will automatically delete the order(s) and I will not be resubmitting them, therefore, no return appointment will be scheduled.    Scheduling Instructions:     Send to Lab today.   Nursing Instructions:    1). STAT: UDS required today. 2). Make sure to document all opioids and benzodiazepines taken, including time of last intake. 3). If order is entered on a procedure day, make sure sample is obtained before any medications are administered.   Nursing Instructions:    1. Medication Agreement: Please go over agreement with the patient. Have the patient read and sign the agreement. Provide patient with a copy of the signed agreement. 2. Make sure that the patient has completed the ORT (Opioid Risk Tool). 3. Provide the patient with a copy of our "Medicatiion Policy", "Medication Recommendations and Reminders", and "CBD information". 4. Remind the patient to always bring their medications and medication bottles (even if empty) to all appointments except for procedure appointments.    Scheduling Instructions:     Sign "Medication Agreement", complete "Opioid Risk Tool", inform patient of our practice "Medication Policies" (Pill counts, always bring bottles, except on procedure days).   Nursing communication    Scheduling  Instructions:     Complete/update the opioid risk tool (ORT) questionnaire.   Follow-up plan:   Return for Procedure (no sedation): (B) Hip inj. #1 under review UDS.      Interventional management options: Planned, scheduled, and/or pending:    Diagnostic bilateral lumbar facet block #1 (Initially denied by insurance)   Considering:    Diagnostic bilateral lumbar facet block  Diagnostic bilateral SI joint block  Diagnostic bilateral hip joint injection  Diagnostic midline L3-4 LESI  Diagnostic midline caudal ESI    PRN Procedures:   None at this time    Recent Visits Date Type Provider Dept  02/14/20 Office Visit Milinda Pointer, [facility]  12/19/19 Telemedicine Milinda Pointer, MD [facility]  11/20/19 Telemedicine Milinda Pointer, MD [facility]  Showing recent visits within past 90 days and meeting all other requirements Future Appointments No visits were found meeting these conditions. Showing future appointments within next 90 days and meeting all other requirements  I discussed the assessment and treatment plan with the patient. The patient was provided an opportunity to ask questions and all were answered. The patient agreed with the plan and demonstrated an understanding of the instructions.  Patient advised to call back or seek an in-person evaluation if the symptoms or condition worsens.  Duration of encounter: 40 minutes.  Note by: Gaspar Cola, MD Date: 02/14/2020; Time: 7:55 PM

## 2020-02-14 ENCOUNTER — Other Ambulatory Visit: Payer: Self-pay

## 2020-02-14 ENCOUNTER — Encounter: Payer: Self-pay | Admitting: Pain Medicine

## 2020-02-14 ENCOUNTER — Ambulatory Visit: Payer: Medicare HMO | Attending: Pain Medicine | Admitting: Pain Medicine

## 2020-02-14 VITALS — BP 149/88 | HR 64 | Temp 97.4°F | Resp 16 | Ht 66.0 in | Wt 159.0 lb

## 2020-02-14 DIAGNOSIS — R937 Abnormal findings on diagnostic imaging of other parts of musculoskeletal system: Secondary | ICD-10-CM | POA: Insufficient documentation

## 2020-02-14 DIAGNOSIS — M25551 Pain in right hip: Secondary | ICD-10-CM | POA: Diagnosis not present

## 2020-02-14 DIAGNOSIS — M961 Postlaminectomy syndrome, not elsewhere classified: Secondary | ICD-10-CM

## 2020-02-14 DIAGNOSIS — M899 Disorder of bone, unspecified: Secondary | ICD-10-CM

## 2020-02-14 DIAGNOSIS — Z79899 Other long term (current) drug therapy: Secondary | ICD-10-CM

## 2020-02-14 DIAGNOSIS — M25552 Pain in left hip: Secondary | ICD-10-CM | POA: Diagnosis not present

## 2020-02-14 DIAGNOSIS — M47816 Spondylosis without myelopathy or radiculopathy, lumbar region: Secondary | ICD-10-CM | POA: Diagnosis not present

## 2020-02-14 DIAGNOSIS — M545 Low back pain, unspecified: Secondary | ICD-10-CM

## 2020-02-14 DIAGNOSIS — G894 Chronic pain syndrome: Secondary | ICD-10-CM

## 2020-02-14 DIAGNOSIS — G8929 Other chronic pain: Secondary | ICD-10-CM

## 2020-02-14 DIAGNOSIS — Z789 Other specified health status: Secondary | ICD-10-CM

## 2020-02-14 MED ORDER — FENTANYL 12 MCG/HR TD PT72: 1.0000 | MEDICATED_PATCH | TRANSDERMAL | 0 refills | Status: DC

## 2020-02-14 NOTE — Progress Notes (Signed)
Safety precautions to be maintained throughout the outpatient stay will include: orient to surroundings, keep bed in low position, maintain call bell within reach at all times, provide assistance with transfer out of bed and ambulation.  

## 2020-02-14 NOTE — Patient Instructions (Addendum)
____________________________________________________________________________________________  Preparing for your procedure (without sedation)  Procedure appointments are limited to planned procedures: . No Prescription Refills. . No disability issues will be discussed. . No medication changes will be discussed.  Instructions: . Oral Intake: Do not eat or drink anything for at least 6 hours prior to your procedure. (Exception: Blood Pressure Medication. See below.) . Transportation: Unless otherwise stated by your physician, you may drive yourself after the procedure. . Blood Pressure Medicine: Do not forget to take your blood pressure medicine with a sip of water the morning of the procedure. If your Diastolic (lower reading)is above 100 mmHg, elective cases will be cancelled/rescheduled. . Blood thinners: These will need to be stopped for procedures. Notify our staff if you are taking any blood thinners. Depending on which one you take, there will be specific instructions on how and when to stop it. . Diabetics on insulin: Notify the staff so that you can be scheduled 1st case in the morning. If your diabetes requires high dose insulin, take only  of your normal insulin dose the morning of the procedure and notify the staff that you have done so. . Preventing infections: Shower with an antibacterial soap the morning of your procedure.  . Build-up your immune system: Take 1000 mg of Vitamin C with every meal (3 times a day) the day prior to your procedure. . Antibiotics: Inform the staff if you have a condition or reason that requires you to take antibiotics before dental procedures. . Pregnancy: If you are pregnant, call and cancel the procedure. . Sickness: If you have a cold, fever, or any active infections, call and cancel the procedure. . Arrival: You must be in the facility at least 30 minutes prior to your scheduled procedure. . Children: Do not bring any children with you. . Dress  appropriately: Bring dark clothing that you would not mind if they get stained. . Valuables: Do not bring any jewelry or valuables.  Reasons to call and reschedule or cancel your procedure: (Following these recommendations will minimize the risk of a serious complication.) . Surgeries: Avoid having procedures within 2 weeks of any surgery. (Avoid for 2 weeks before or after any surgery). . Flu Shots: Avoid having procedures within 2 weeks of a flu shots or . (Avoid for 2 weeks before or after immunizations). . Barium: Avoid having a procedure within 7-10 days after having had a radiological study involving the use of radiological contrast. (Myelograms, Barium swallow or enema study). . Heart attacks: Avoid any elective procedures or surgeries for the initial 6 months after a "Myocardial Infarction" (Heart Attack). . Blood thinners: It is imperative that you stop these medications before procedures. Let us know if you if you take any blood thinner.  . Infection: Avoid procedures during or within two weeks of an infection (including chest colds or gastrointestinal problems). Symptoms associated with infections include: Localized redness, fever, chills, night sweats or profuse sweating, burning sensation when voiding, cough, congestion, stuffiness, runny nose, sore throat, diarrhea, nausea, vomiting, cold or Flu symptoms, recent or current infections. It is specially important if the infection is over the area that we intend to treat. . Heart and lung problems: Symptoms that may suggest an active cardiopulmonary problem include: cough, chest pain, breathing difficulties or shortness of breath, dizziness, ankle swelling, uncontrolled high or unusually low blood pressure, and/or palpitations. If you are experiencing any of these symptoms, cancel your procedure and contact your primary care physician for an evaluation.  Remember:  Regular   Business hours are:  Monday to Thursday 8:00 AM to 4:00  PM  Provider's Schedule: Milinda Pointer, MD:  Procedure days: Tuesday and Thursday 7:30 AM to 4:00 PM  Gillis Santa, MD:  Procedure days: Monday and Wednesday 7:30 AM to 4:00 PM ____________________________________________________________________________________________   ____________________________________________________________________________________________  General Risks and Possible Complications  Patient Responsibilities: It is important that you read this as it is part of your informed consent. It is our duty to inform you of the risks and possible complications associated with treatments offered to you. It is your responsibility as a patient to read this and to ask questions about anything that is not clear or that you believe was not covered in this document.  Patient's Rights: You have the right to refuse treatment. You also have the right to change your mind, even after initially having agreed to have the treatment done. However, under this last option, if you wait until the last second to change your mind, you may be charged for the materials used up to that point.  Introduction: Medicine is not an Chief Strategy Officer. Everything in Medicine, including the lack of treatment(s), carries the potential for danger, harm, or loss (which is by definition: Risk). In Medicine, a complication is a secondary problem, condition, or disease that can aggravate an already existing one. All treatments carry the risk of possible complications. The fact that a side effects or complications occurs, does not imply that the treatment was conducted incorrectly. It must be clearly understood that these can happen even when everything is done following the highest safety standards.  No treatment: You can choose not to proceed with the proposed treatment alternative. The "PRO(s)" would include: avoiding the risk of complications associated with the therapy. The "CON(s)" would include: not getting any of the  treatment benefits. These benefits fall under one of three categories: diagnostic; therapeutic; and/or palliative. Diagnostic benefits include: getting information which can ultimately lead to improvement of the disease or symptom(s). Therapeutic benefits are those associated with the successful treatment of the disease. Finally, palliative benefits are those related to the decrease of the primary symptoms, without necessarily curing the condition (example: decreasing the pain from a flare-up of a chronic condition, such as incurable terminal cancer).  General Risks and Complications: These are associated to most interventional treatments. They can occur alone, or in combination. They fall under one of the following six (6) categories: no benefit or worsening of symptoms; bleeding; infection; nerve damage; allergic reactions; and/or death. 1. No benefits or worsening of symptoms: In Medicine there are no guarantees, only probabilities. No healthcare provider can ever guarantee that a medical treatment will work, they can only state the probability that it may. Furthermore, there is always the possibility that the condition may worsen, either directly, or indirectly, as a consequence of the treatment. 2. Bleeding: This is more common if the patient is taking a blood thinner, either prescription or over the counter (example: Goody Powders, Fish oil, Aspirin, Garlic, etc.), or if suffering a condition associated with impaired coagulation (example: Hemophilia, cirrhosis of the liver, low platelet counts, etc.). However, even if you do not have one on these, it can still happen. If you have any of these conditions, or take one of these drugs, make sure to notify your treating physician. 3. Infection: This is more common in patients with a compromised immune system, either due to disease (example: diabetes, cancer, human immunodeficiency virus [HIV], etc.), or due to medications or treatments (example: therapies used  to treat cancer and rheumatological diseases). However, even if you do not have one on these, it can still happen. If you have any of these conditions, or take one of these drugs, make sure to notify your treating physician. 4. Nerve Damage: This is more common when the treatment is an invasive one, but it can also happen with the use of medications, such as those used in the treatment of cancer. The damage can occur to small secondary nerves, or to large primary ones, such as those in the spinal cord and brain. This damage may be temporary or permanent and it may lead to impairments that can range from temporary numbness to permanent paralysis and/or brain death. 5. Allergic Reactions: Any time a substance or material comes in contact with our body, there is the possibility of an allergic reaction. These can range from a mild skin rash (contact dermatitis) to a severe systemic reaction (anaphylactic reaction), which can result in death. 6. Death: In general, any medical intervention can result in death, most of the time due to an unforeseen complication. ____________________________________________________________________________________________  ____________________________________________________________________________________________  Drug Holidays (Slow)  What is a "Drug Holiday"? Drug Holiday: is the name given to the period of time during which a patient stops taking a medication(s) for the purpose of eliminating tolerance to the drug.  Benefits . Improved effectiveness of opioids. . Decreased opioid dose needed to achieve benefits. . Improved pain with lesser dose.  What is tolerance? Tolerance: is the progressive decreased in effectiveness of a drug due to its repetitive use. With repetitive use, the body gets use to the medication and as a consequence, it loses its effectiveness. This is a common problem seen with opioid pain medications. As a result, a larger dose of the drug is needed  to achieve the same effect that used to be obtained with a smaller dose.  How long should a "Drug Holiday" last? You should stay off of the pain medicine for at least 14 consecutive days. (2 weeks)  Should I stop the medicine "cold [place]"? No. You should always coordinate with your Pain Specialist so that he/she can provide you with the correct medication dose to make the transition as smoothly as possible.  How do I stop the medicine? Slowly. You will be instructed to decrease the daily amount of pills that you take by one (1) pill every seven (7) days. This is called a "slow downward taper" of your dose. For example: if you normally take four (4) pills per day, you will be asked to drop this dose to three (3) pills per day for seven (7) days, then to two (2) pills per day for seven (7) days, then to one (1) per day for seven (7) days, and at the end of those last seven (7) days, this is when the "Drug Holiday" would start.   Will I have withdrawals? By doing a "slow downward taper" like this one, it is unlikely that you will experience any significant withdrawal symptoms. Typically, what triggers withdrawals is the sudden stop of a high dose opioid therapy. Withdrawals can usually be avoided by slowly decreasing the dose over a prolonged period of time. If you do not follow these instructions and decide to stop your medication abruptly, withdrawals may be possible.  What are withdrawals? Withdrawals: refers to the wide range of symptoms that occur after stopping or dramatically reducing opiate drugs after heavy and prolonged use. Withdrawal symptoms do not occur to patients that use low dose opioids,  or those who take the medication sporadically. Contrary to benzodiazepine (example: Valium, Xanax, etc.) or alcohol withdrawals ("Delirium Tremens"), opioid withdrawals are not lethal. Withdrawals are the physical manifestation of the body getting rid of the excess receptors.  Expected  Symptoms Early symptoms of withdrawal may include: . Agitation . Anxiety . Muscle aches . Increased tearing . Insomnia . Runny nose . Sweating . Yawning  Late symptoms of withdrawal may include: . Abdominal cramping . Diarrhea . Dilated pupils . Goose bumps . Nausea . Vomiting  Will I experience withdrawals? Due to the slow nature of the taper, it is very unlikely that you will experience any.  What is a slow taper? Taper: refers to the gradual decrease in dose.  (Last update: 01/31/2020) ____________________________________________________________________________________________    ____________________________________________________________________________________________  Medication Rules  Purpose: To inform patients, and their family members, of our rules and regulations.  Applies to: All patients receiving prescriptions (written or electronic).  Pharmacy of record: Pharmacy where electronic prescriptions will be sent. If written prescriptions are taken to a different pharmacy, please inform the nursing staff. The pharmacy listed in the electronic medical record should be the one where you would like electronic prescriptions to be sent.  Electronic prescriptions: In compliance with the [place] (STOP) Act of 2017 (Session Lanny Cramp [phone]), effective July 13, 2018, all controlled substances must be electronically prescribed. Calling prescriptions to the pharmacy will cease to exist.  Prescription refills: Only during scheduled appointments. Applies to all prescriptions.  NOTE: The following applies primarily to controlled substances (Opioid* Pain Medications).   Type of encounter (visit): For patients receiving controlled substances, face-to-face visits are required. (Not an option or up to the patient.)  Patient's responsibilities: 1. Pain Pills: Bring all pain pills to every appointment (except for procedure  appointments). 2. Pill Bottles: Bring pills in original pharmacy bottle. Always bring the newest bottle. Bring bottle, even if empty. 3. Medication refills: You are responsible for knowing and keeping track of what medications you take and those you need refilled. The day before your appointment: write a list of all prescriptions that need to be refilled. The day of the appointment: give the list to the admitting nurse. Prescriptions will be written only during appointments. No prescriptions will be written on procedure days. If you forget a medication: it will not be "Called in", "Faxed", or "electronically sent". You will need to get another appointment to get these prescribed. No early refills. Do not call asking to have your prescription filled early. 4. Prescription Accuracy: You are responsible for carefully inspecting your prescriptions before leaving our office. Have the discharge nurse carefully go over each prescription with you, before taking them home. Make sure that your name is accurately spelled, that your address is correct. Check the name and dose of your medication to make sure it is accurate. Check the number of pills, and the written instructions to make sure they are clear and accurate. Make sure that you are given enough medication to last until your next medication refill appointment. 5. Taking Medication: Take medication as prescribed. When it comes to controlled substances, taking less pills or less frequently than prescribed is permitted and encouraged. Never take more pills than instructed. Never take medication more frequently than prescribed.  6. Inform other Doctors: Always inform, all of your healthcare providers, of all the medications you take. 7. Pain Medication from other Providers: You are not allowed to accept any additional pain medication from any other Doctor or  Healthcare provider. There are two exceptions to this rule. (see below) In the event that you require  additional pain medication, you are responsible for notifying us, as stated below. 8. Medication Agreement: You are responsible for carefully reading and following our Medication Agreement. This must be signed before receiving any prescriptions from our practice. Safely store a copy of your signed Agreement. Violations to the Agreement will result in no further prescriptions. (Additional copies of our Medication Agreement are available upon request.) 9. Laws, Rules, & Regulations: All patients are expected to follow all Federal and [employer], [employer], Rules, [employer] [facility]. Ignorance of the Laws does not constitute a valid excuse.  10. Illegal drugs and Controlled Substances: The use of illegal substances (including, but not limited to marijuana and its derivatives) and/or the illegal use of any controlled substances is strictly prohibited. Violation of this rule may result in the immediate and permanent discontinuation of any and all prescriptions being written by our practice. The use of any illegal substances is prohibited. 11. Adopted CDC guidelines & recommendations: Target dosing levels will be at or below 60 MME/day. Use of benzodiazepines** is not recommended.  Exceptions: There are only two exceptions to the rule of not receiving pain medications from other Healthcare Providers. 1. Exception #1 (Emergencies): In the event of an emergency (i.e.: accident requiring emergency care), you are allowed to receive additional pain medication. However, you are responsible for: As soon as you are able, call our office [phone], at any time of the day or night, and leave a message stating your name, the date and nature of the emergency, and the name and dose of the medication prescribed. In the event that your call is answered by a member of our staff, make sure to document and save the date, time, and the name of the person that took your information.  2. Exception #2 (Planned Surgery): In the event  that you are scheduled by another doctor or dentist to have any type of surgery or procedure, you are allowed (for a period no longer than 30 days), to receive additional pain medication, for the acute post-op pain. However, in this case, you are responsible for picking up a copy of our "Post-op Pain Management for Surgeons" handout, and giving it to your surgeon or dentist. This document is available at our office, and does not require an appointment to obtain it. Simply go to our office during business hours (Monday-Thursday from 8:00 AM to 4:00 PM) (Friday 8:00 AM to 12:00 Noon) or if you have a scheduled appointment with [place], prior to your surgery, and ask for it by name. In addition, you will need to provide [place] with your name, name of your surgeon, type of surgery, and date of procedure or surgery.  *Opioid medications include: morphine, codeine, oxycodone, oxymorphone, hydrocodone, hydromorphone, meperidine, tramadol, tapentadol, buprenorphine, fentanyl, methadone. **Benzodiazepine medications include: diazepam (Valium), alprazolam (Xanax), clonazepam (Klonopine), lorazepam (Ativan), clorazepate (Tranxene), chlordiazepoxide (Librium), estazolam (Prosom), oxazepam (Serax), temazepam (Restoril), triazolam (Halcion) (Last updated: 09/09/2017) ____________________________________________________________________________________________   ____________________________________________________________________________________________  Medication Recommendations and Reminders  Applies to: All patients receiving prescriptions (written and/or electronic).  Medication Rules & Regulations: These rules and regulations exist for your safety and that of others. They are not flexible and neither are we. Dismissing or ignoring them will be considered "non-compliance" with medication therapy, resulting in complete and irreversible termination of such therapy. (See document titled "Medication Rules" for more details.)  In all conscience, because of safety reasons, we cannot continue  providing a therapy where the patient does not follow instructions.  Pharmacy of record:   Definition: This is the pharmacy where your electronic prescriptions will be sent.   We do not endorse any particular pharmacy, however, we have experienced problems with [facility] not securing enough medication supply for the community.  We do not restrict you in your choice of pharmacy. However, once we write for your prescriptions, we will NOT be re-sending more prescriptions to fix restricted supply problems created by your pharmacy, or your insurance.   The pharmacy listed in the electronic medical record should be the one where you want electronic prescriptions to be sent.  If you choose to change pharmacy, simply notify our nursing staff.  Recommendations:  Keep all of your pain medications in a safe place, under lock and key, even if you live alone. We will NOT replace lost, stolen, or damaged medication.  After you fill your prescription, take 1 week's worth of pills and put them away in a safe place. You should keep a separate, properly labeled bottle for this purpose. The remainder should be kept in the original bottle. Use this as your primary supply, until it runs out. Once it's gone, then you know that you have 1 week's worth of medicine, and it is time to come in for a prescription refill. If you do this correctly, it is unlikely that you will ever run out of medicine.  To make sure that the above recommendation works, it is very important that you make sure your medication refill appointments are scheduled at least 1 week before you run out of medicine. To do this in an effective manner, make sure that you do not leave the office without scheduling your next medication management appointment. Always ask the nursing staff to show you in your prescription , when your medication will be running out. Then arrange for the  receptionist to get you a return appointment, at least 7 days before you run out of medicine. Do not wait until you have 1 or 2 pills left, to come in. This is very poor planning and does not take into consideration that we may need to cancel appointments due to bad weather, sickness, or emergencies affecting our staff.  DO NOT ACCEPT A "Partial Fill": If for any reason your pharmacy does not have enough pills/tablets to completely fill or refill your prescription, do not allow for a "partial fill". The law allows the pharmacy to complete that prescription within 72 hours, without requiring a new prescription. If they do not fill the rest of your prescription within those 72 hours, you will need a separate prescription to fill the remaining amount, which we will NOT provide. If the reason for the partial fill is your insurance, you will need to talk to the pharmacist about payment alternatives for the remaining tablets, but again, DO NOT ACCEPT A PARTIAL FILL, unless you can trust your pharmacist to obtain the remainder of the pills within 72 hours.  Prescription refills and/or changes in medication(s):   Prescription refills, and/or changes in dose or medication, will be conducted only during scheduled medication management appointments. (Applies to both, written and electronic prescriptions.)  No refills on procedure days. No medication will be changed or started on procedure days. No changes, adjustments, and/or refills will be conducted on a procedure day. Doing so will interfere with the diagnostic portion of the procedure.  No phone refills. No medications will be "called into the pharmacy".  No Fax refills.  No weekend refills.  No Holliday refills.  No after hours refills.  Remember:  Business hours are:  Monday to Thursday 8:00 AM to 4:00 PM Provider's Schedule: Milinda Pointer, MD - Appointments are:  Medication management: Monday and Wednesday 8:00 AM to 4:00 PM Procedure day:  Tuesday and Thursday 7:30 AM to 4:00 PM Gillis Santa, MD - Appointments are:  Medication management: Tuesday and Thursday 8:00 AM to 4:00 PM Procedure day: Monday and Wednesday 7:30 AM to 4:00 PM (Last update: 01/31/2020) ____________________________________________________________________________________________   ____________________________________________________________________________________________  CANNABIDIOL (AKA: CBD Oil or Pills)  Applies to: All patients receiving prescriptions of controlled substances (written and/or electronic).  General Information: Cannabidiol (CBD), a derivative of Marijuana, was discovered in 76. It is one of some 113 identified cannabinoids in cannabis (Marijuana) plants, accounting for up to 40% of the plant's extract. As of 2018, preliminary clinical research on cannabidiol included studies of anxiety, cognition, movement disorders, and pain.  Cannabidiol is consummed in multiple ways, including inhalation of cannabis smoke or vapor, as an aerosol spray into the cheek, and by mouth. It may be supplied as CBD oil containing CBD as the active ingredient (no added tetrahydrocannabinol (THC) or terpenes), a full-plant CBD-dominant hemp extract oil, capsules, dried cannabis, or as a liquid solution. CBD is thought not have the same psychoactivity as THC, and may affect the actions of THC. Studies suggest that CBD may interact with different biological targets, including cannabinoid receptors and other neurotransmitter receptors. As of 2018 the mechanism of action for its biological effects has not been determined.  In the [place], cannabidiol has a limited approval by the Food and Drug Administration (FDA) for treatment of only two types of epilepsy disorders. The side effects of long-term use of the drug include somnolence, decreased appetite, diarrhea, fatigue, malaise, weakness, sleeping problems, and others.  CBD remains a Schedule I drug prohibited  for any use.  Legality: Some manufacturers ship CBD products nationally, an illegal action which the FDA has not enforced in 2018, with CBD remaining the subject of an FDA investigational new drug evaluation, and is not considered legal as a dietary supplement or food ingredient as of December 2018. Federal illegality has made it difficult historically to conduct research on CBD. CBD is openly sold in head shops and health food stores in some states where such sales have not been explicitly legalized.  Warning: Because it is not FDA approved for general use or treatment of pain, it is not required to undergo the same manufacturing controls as prescription drugs.  This means that the available cannabidiol (CBD) may be contaminated with THC.  If this is the case, it will trigger a positive urine drug screen (UDS) test for cannabinoids (Marijuana).  Because a positive UDS for illicit substances is a violation of our medication agreement, your opioid analgesics (pain medicine) may be permanently discontinued. (Last update: 01/31/2020) ____________________________________________________________________________________________

## 2020-02-16 ENCOUNTER — Ambulatory Visit: Payer: Medicare HMO | Admitting: Nurse Practitioner

## 2020-02-17 ENCOUNTER — Other Ambulatory Visit: Payer: Self-pay | Admitting: Pain Medicine

## 2020-02-17 DIAGNOSIS — G894 Chronic pain syndrome: Secondary | ICD-10-CM

## 2020-02-17 LAB — COMPLIANCE DRUG ANALYSIS, UR

## 2020-02-19 ENCOUNTER — Telehealth (INDEPENDENT_AMBULATORY_CARE_PROVIDER_SITE_OTHER): Payer: Medicare HMO | Admitting: Psychiatry

## 2020-02-19 ENCOUNTER — Encounter: Payer: Self-pay | Admitting: Psychiatry

## 2020-02-19 ENCOUNTER — Other Ambulatory Visit: Payer: Self-pay

## 2020-02-19 DIAGNOSIS — F3342 Major depressive disorder, recurrent, in full remission: Secondary | ICD-10-CM | POA: Diagnosis not present

## 2020-02-19 DIAGNOSIS — Z634 Disappearance and death of family member: Secondary | ICD-10-CM | POA: Diagnosis not present

## 2020-02-19 DIAGNOSIS — F22 Delusional disorders: Secondary | ICD-10-CM | POA: Diagnosis not present

## 2020-02-19 DIAGNOSIS — F5105 Insomnia due to other mental disorder: Secondary | ICD-10-CM

## 2020-02-19 DIAGNOSIS — F411 Generalized anxiety disorder: Secondary | ICD-10-CM | POA: Diagnosis not present

## 2020-02-19 DIAGNOSIS — R69 Illness, unspecified: Secondary | ICD-10-CM | POA: Diagnosis not present

## 2020-02-19 LAB — COMP. METABOLIC PANEL (12)
AST: 29 IU/L (ref 0–40)
Albumin/Globulin Ratio: 1.8 (ref 1.2–2.2)
Albumin: 4.9 g/dL — ABNORMAL HIGH (ref 3.6–4.6)
Alkaline Phosphatase: 76 IU/L (ref 48–121)
BUN/Creatinine Ratio: 17 (ref 12–28)
BUN: 19 mg/dL (ref 8–27)
Bilirubin Total: 0.4 mg/dL (ref 0.0–1.2)
Calcium: 10.4 mg/dL — ABNORMAL HIGH (ref 8.7–10.3)
Chloride: 103 mmol/L (ref 96–106)
Creatinine, Ser: 1.12 mg/dL — ABNORMAL HIGH (ref 0.57–1.00)
GFR calc Af Amer: 50 mL/min/{1.73_m2} — ABNORMAL LOW (ref >59)
GFR calc non Af Amer: 44 mL/min/{1.73_m2} — ABNORMAL LOW (ref >59)
Globulin, Total: 2.7 g/dL (ref 1.5–4.5)
Glucose: 103 mg/dL — ABNORMAL HIGH (ref 65–99)
Potassium: 5.3 mmol/L — ABNORMAL HIGH (ref 3.5–5.2)
Sodium: 144 mmol/L (ref 134–144)
Total Protein: 7.6 g/dL (ref 6.0–8.5)

## 2020-02-19 LAB — 25-HYDROXY VITAMIN D LCMS D2+D3
25-Hydroxy, Vitamin D-2: <1 ng/mL
25-Hydroxy, Vitamin D-3: 84 ng/mL
25-Hydroxy, Vitamin D: 84 ng/mL

## 2020-02-19 LAB — MAGNESIUM: Magnesium: 2.3 mg/dL (ref 1.6–2.3)

## 2020-02-19 MED ORDER — ESCITALOPRAM OXALATE 20 MG PO TABS: 20.0000 mg | ORAL_TABLET | Freq: Every day | ORAL | 0 refills | Status: DC

## 2020-02-19 NOTE — Progress Notes (Signed)
Provider Location : [facility] Patient Location : [place]  Virtual Visit via Telephone Note  I connected with Kristen Pennington on 02/19/20 at 11:40 AM EDT by telephone and verified that I am speaking with the correct person using two identifiers.   I discussed the limitations, risks, security and privacy concerns of performing an evaluation and management service by telephone and the availability of in person appointments. I also discussed with the patient that there may be a patient responsible charge related to this service. The patient expressed understanding and agreed to proceed.    I discussed the assessment and treatment plan with the patient. The patient was provided an opportunity to ask questions and all were answered. The patient agreed with the plan and demonstrated an understanding of the instructions.   The patient was advised to call back or seek an in-person evaluation if the symptoms worsen or if the condition fails to improve as anticipated.  [facility] MD OP Progress Note  02/19/2020 2:39 PM Kristen Pennington  MRN:  [id number]  Chief Complaint:  Chief Complaint    Follow-up     HPI: Kristen Pennington is a 86 year old Caucasian female, widowed, lives in [place], has a history of GAD, MDD, delusional disorder, insomnia, hearing loss, vision loss, OSA, hypertension was evaluated by phone today.  Patient preferred to do a phone call.  Patient today reports she is currently making progress with regards to her pain.  She was recently started on fentanyl patch and is currently under the care of pain provider.  She reports her mood symptoms as improved.  She denies any significant anxiety symptoms.  Patient reports she is coping with the loss of her son.  She reports sleep is good.  She is compliant on medications as prescribed.  Patient did not seem to be preoccupied with any paranoia or delusions.   She denies side effects to medications.   Visit Diagnosis:    ICD-10-CM    1. Generalized anxiety disorder  F41.1 escitalopram (LEXAPRO) 20 MG tablet  2. MDD (major depressive disorder), recurrent, in full remission ([place])  F33.42   3. Delusional disorder ([place])  F22   4. Insomnia due to mental condition  F51.05   5. Bereavement  Z63.4     Past Psychiatric History: I have reviewed past psychiatric history from my progress note on 07/25/2018.  Past trials of Klonopin, Abilify, Lexapro, mirtazapine  Past Medical History:  Past Medical History:  Diagnosis Date  . Arthritis   . Atrophic vaginitis 03/06/2013   Last Assessment & Plan:  She will plan to continue estradiol vaginal cream.  Prescription was rewritten stipulating use of the generic product.   . Atypical migraine 03/06/2013   Last Assessment & Plan:  Since she rarely takes sumatriptan, we will discontinue it. Continue sparing use of OTC migraine products.   . Avitaminosis D 03/06/2013   Last Assessment & Plan:  Recheck vitamin D level. Plan to continue vitamin D supplementation.   . Ceratitis 08/01/2013   Last Assessment & Plan:  Because of the expense, she will discontinue Restasis. She will use over-the-counter moisturizing eyedrops and liquid gel.   . Colon polyp   . 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   . 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   . Cystocele, midline 03/06/2013  . Demoralization  and apathy 03/06/2013   Last Assessment & Plan:  Since she has been taking bupropion only once a day, I will write the prescription with the correct instructions and correct quantity. She has done well on this for years and she is encouraged to take it regularly   . Depression   . Environmental allergies   . Epistaxis   . Essential (primary) hypertension 05/17/2013   Last Assessment & Plan:  Her blood pressure is well-controlled.  We will plan to continue hydrochlorothiazide and benazepril.  Both are generic and should be affordable on her health plan.   Marland Kitchen GERD (gastroesophageal reflux disease)   . Hemorrhoid   . Inflammation of sacroiliac joint ([place]) 03/06/2013   Last Assessment & Plan:  She is doing well on her present regimen of fentanyl patch supplemented with sparing use of tramadol/acetaminophen. We will continue that regimen.   . Sinusitis   . Sleep apnea    CPAP    Past Surgical History:  Procedure Laterality Date  . COLONOSCOPY  05-03-15  . COLONOSCOPY WITH PROPOFOL N/A 05/03/2015   Procedure: COLONOSCOPY WITH PROPOFOL;  Surgeon: Lucilla Lame, MD;  Location: [facility];  Service: Endoscopy;  Laterality: N/A;  CPAP  . DILATION AND CURETTAGE OF UTERUS  1970  . ENDOMETRIAL BIOPSY    . EYE SURGERY Right 2017  . POLYPECTOMY  05/03/2015   Procedure: POLYPECTOMY;  Surgeon: Lucilla Lame, MD;  Location: [facility];  Service: Endoscopy;;  . ROTATOR CUFF REPAIR  2007  . SPINE SURGERY      Family Psychiatric History: I have reviewed family psychiatric history from my progress note on 07/25/2018  Family History:  Family History  Problem Relation Age of Onset  . Arthritis Mother   . Alzheimer's disease Father   . Heart disease Sister   . Cancer Brother   . Ovarian cancer Neg Hx   . Breast cancer Neg Hx   . Colon cancer Neg Hx   . Diabetes Neg Hx     Social History: I have reviewed social history from my progress note on 07/25/2018 Social History   Socioeconomic History  . Marital status: Widowed    Spouse name: Not on file  . Number of children: 2  . Years of education: Not on file  . Highest education level: Some college, no degree  Occupational History  . Not on file  Tobacco Use  . Smoking status: Never Smoker  . Smokeless tobacco: Never Used  Vaping Use  . Vaping Use: Never used  Substance and Sexual Activity  . Alcohol use: No    Alcohol/week: 0.0 standard drinks  . Drug  use: No  . Sexual activity: Not Currently  Other Topics Concern  . Not on file  Social History Narrative  . Not on file   Social Determinants of Health   Financial Resource Strain:   . Difficulty of Paying Living Expenses:   Food Insecurity:   . Worried About Charity fundraiser in the Last Year:   . Arboriculturist in the Last Year:   Transportation Needs:   . Film/video editor (Medical):   Marland Kitchen Lack of Transportation (Non-Medical):   Physical Activity:   . Days of Exercise per Week:   . Minutes of Exercise per Session:   Stress:   . Feeling of Stress :   Social Connections:   . Frequency of Communication with Friends and Family:   . Frequency of Social Gatherings with Friends and  Family:   . Attends Religious Services:   . Active Member of Clubs or Organizations:   . Attends Archivist Meetings:   Marland Kitchen Marital Status:     Allergies: No Known Allergies  Metabolic Disorder Labs: Lab Results  Component Value Date   HGBA1C 5.4 08/01/2018   Lab Results  Component Value Date   PROLACTIN 8.1 08/01/2018   Lab Results  Component Value Date   CHOL 245 (H) 08/01/2018   TRIG 187 (H) 08/01/2018   HDL 49 08/01/2018   LDLCALC 159 (H) 08/01/2018   Lab Results  Component Value Date   TSH 2.830 08/10/2018   TSH 5.550 (H) 08/01/2018    Therapeutic Level Labs: No results found for: LITHIUM No results found for: VALPROATE No components found for:  CBMZ  Current Medications: Current Outpatient Medications  Medication Sig Dispense Refill  . acetaminophen (TYLENOL) 500 MG tablet Take 500 mg by mouth every 6 (six) hours as needed.    . bisoprolol-hydrochlorothiazide (ZIAC) 5-6.25 MG tablet Take 1 tablet by mouth daily. 30 tablet 3  . Carboxymethylcellulose Sodium (THERATEARS OP) Apply to eye.    . Cholecalciferol (VITAMIN D3) 5000 units TABS Take 5,000 Units by mouth daily.    Marland Kitchen dicyclomine (BENTYL) 10 MG capsule Take 10 mg by mouth 4 (four) times daily -  before  meals and at bedtime.    Marland Kitchen escitalopram (LEXAPRO) 20 MG tablet Take 1 tablet (20 mg total) by mouth daily. 90 tablet 0  . estradiol (ESTRACE) 0.1 MG/GM vaginal cream Use once a week as needed 42.5 g 1  . fentaNYL (DURAGESIC) 12 MCG/HR Place 1 patch onto the skin every 3 (three) days. Must last 30 days. 10 patch 0  . fluticasone (FLONASE) 50 MCG/ACT nasal spray 1 SPRAY IN EACH NOSTRIL ONCE ADAY AS NEEDED 16 g 1  . Liniments (SALONPAS PAIN RELIEF PATCH EX) Apply 1 patch topically 2 (two) times daily.    Marland Kitchen lubiprostone (AMITIZA) 8 MCG capsule Take 1 capsule po bid prn ibs constipation. 60 capsule 3  . meloxicam (MOBIC) 7.5 MG tablet Take 1 tablet (7.5 mg total) by mouth daily. 30 tablet 5  . mirtazapine (REMERON) 15 MG tablet TAKE ONE TABLET AT BEDTIME AS NEEDED FORSLEEP 90 tablet 0  . Multiple Vitamins-Minerals (CENTRUM SILVER 50+WOMEN) TABS Take by mouth.    . oxybutynin (DITROPAN-XL) 10 MG 24 hr tablet Take 1 tablet (10 mg total) by mouth daily. 30 tablet 2  . pantoprazole (PROTONIX) 40 MG tablet Take 1 tablet (40 mg total) by mouth daily. 90 tablet 1  . Polyethyl Glycol-Propyl Glycol (SYSTANE) 0.4-0.3 % SOLN Apply to eye.    . promethazine (PHENERGAN) 25 MG tablet Take 25 mg by mouth as needed.     Marland Kitchen rOPINIRole (REQUIP) 1 MG tablet TAKE ONE TABLET 3 TIMES DAILY AS NEEDED FOR RESTLESS LEGS 90 tablet 3  . simethicone (MYLICON) 80 MG chewable tablet Chew 80 mg by mouth every 6 (six) hours as needed for flatulence.    Marland Kitchen XIIDRA 5 % SOLN Apply to eye. Sample given by eye dr     No current facility-administered medications for this visit.     Musculoskeletal: Strength & Muscle Tone: UTA Gait & Station: UTA Patient leans: N/A  Psychiatric Specialty Exam: Review of Systems  Psychiatric/Behavioral: Negative for agitation, behavioral problems, confusion, decreased concentration, dysphoric mood, hallucinations, self-injury, sleep disturbance and suicidal ideas. The patient is nervous/anxious. The  patient is not hyperactive.   All other systems  reviewed and are negative.   There were no vitals taken for this visit.There is no height or weight on file to calculate BMI.  General Appearance: UTA  Eye Contact:  UTA  Speech:  Clear and Coherent  Volume:  Normal  Mood:  Anxious  Affect:  UTA  Thought Process:  Goal Directed and Descriptions of Associations: Intact  Orientation:  Full (Time, Place, and Person)  Thought Content: Logical   Suicidal Thoughts:  No  Homicidal Thoughts:  No  Memory:  Immediate;   Fair Recent;   Fair Remote;   Fair  Judgement:  Fair  Insight:  Fair  Psychomotor Activity:  UTA  Concentration:  Concentration: Fair and Attention Span: Fair  Recall:  [employer] of Knowledge: Fair  Language: Fair  Akathisia:  No  Handed:  Right  AIMS (if indicated): UTA  Assets:  Communication Skills Desire for Improvement Housing Social Support  ADL's:  Intact  Cognition: WNL  Sleep:  Fair   Screenings: Mini-Mental     Clinical Support from 03/06/2019 in [facility], [place] from 03/02/2018 in [facility], [facility]  Total Score (max 30 points ) 30 27    PHQ2-9     Office Visit from 02/14/2020 in [facility] Office Visit from 01/19/2020 in [facility], [place] from 03/06/2019 in [facility] Office Visit from 11/08/2018 in [facility], [facility] Office Visit from 08/08/2018 in [facility], [facility]  PHQ-2 Total Score 0 0 0 0 1       Assessment and Plan: Kristen Pennington is a 86 year old female who has a history of GAD, MDD, history of psychosis, essential hypertension, chronic pain, OSA, hearing loss, bereavement was evaluated by phone today.  Patient is currently doing well on the current medication regimen.  Plan as noted below.  Plan GAD-stable Lexapro 20 mg p.o. daily  Insomnia-stable Mirtazapine 7.5 to 15 mg p.o.  nightly Continue CPAP  Delusional disorder in remission We will monitor closely  Delirium-we will monitor closely. She declined psychotherapy referral.  Follow-up in clinic in 2 to 3 months or sooner if needed.  I have spent atleast 10 minutes non face to face with patient today. More than 50 % of the time was spent for preparing to see the patient ( e.g., review of test, records ), ordering medications and test ,psychoeducation and supportive psychotherapy and care coordination,as well as documenting clinical information in electronic health record. This note was generated in part or whole with voice recognition software. Voice recognition is usually quite accurate but there are transcription errors that can and very often do occur. I apologize for any typographical errors that were not detected and corrected.         Ursula Alert, MD 02/19/2020, 2:39 PM

## 2020-02-22 ENCOUNTER — Telehealth: Payer: Self-pay

## 2020-02-22 NOTE — Telephone Encounter (Signed)
Confirmed and screened for 02-26-20 ov. 

## 2020-02-23 ENCOUNTER — Telehealth: Payer: Self-pay

## 2020-02-23 NOTE — Telephone Encounter (Signed)
Confirmed and screened for 02-26-20 ov. 

## 2020-02-26 ENCOUNTER — Ambulatory Visit: Payer: Medicare HMO | Admitting: Nurse Practitioner

## 2020-03-05 ENCOUNTER — Telehealth: Payer: Self-pay

## 2020-03-05 NOTE — Telephone Encounter (Signed)
Confirmed and screened for 03-07-20 ov.

## 2020-03-06 ENCOUNTER — Ambulatory Visit: Payer: Medicare HMO | Admitting: Adult Health

## 2020-03-07 ENCOUNTER — Ambulatory Visit (INDEPENDENT_AMBULATORY_CARE_PROVIDER_SITE_OTHER): Payer: Medicare HMO | Admitting: Nurse Practitioner

## 2020-03-07 ENCOUNTER — Encounter: Payer: Self-pay | Admitting: Nurse Practitioner

## 2020-03-07 VITALS — BP 144/78 | HR 61 | Temp 97.8°F | Resp 16 | Ht 65.0 in | Wt 152.0 lb

## 2020-03-07 DIAGNOSIS — R69 Illness, unspecified: Secondary | ICD-10-CM | POA: Diagnosis not present

## 2020-03-07 DIAGNOSIS — N289 Disorder of kidney and ureter, unspecified: Secondary | ICD-10-CM

## 2020-03-07 DIAGNOSIS — E875 Hyperkalemia: Secondary | ICD-10-CM

## 2020-03-07 DIAGNOSIS — F3341 Major depressive disorder, recurrent, in partial remission: Secondary | ICD-10-CM

## 2020-03-07 DIAGNOSIS — I10 Essential (primary) hypertension: Secondary | ICD-10-CM

## 2020-03-07 DIAGNOSIS — Z0001 Encounter for general adult medical examination with abnormal findings: Secondary | ICD-10-CM

## 2020-03-07 DIAGNOSIS — M47816 Spondylosis without myelopathy or radiculopathy, lumbar region: Secondary | ICD-10-CM

## 2020-03-07 NOTE — Progress Notes (Signed)
[facility] [place], [place] [address]  Internal MEDICINE  Office Visit Note  Patient Name: Kristen Pennington  [id number]  [id number]  Date of Service: 03/20/2020   Pt is here for routine health maintenance examination  Chief Complaint  Patient presents with  . Medicare Wellness    1 year  . Quality Metric Gaps    flu vacc     The patient is here for health maintenance exam. She states that she has a little more depression than usual. 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 They are not getting along well. The patient does see psychiatry on routine basis. The patient states that she has a good relationship with her psychiatrist.  The patient was started on fentanyl patch 12.mcg/hour, changing it every three days. Since this prescription was started, she has consulted with pain management provider. Prescription was renewed once and she will start some new treatments with her next visit. She states that the fentanyl patch did help some. Did not take pain away completely, but did help. She did have labs done, ordered per pain management provider. Renal functions were up a little, as were potassium and calcium levels.     Current Medication: Outpatient Encounter Medications as of 03/07/2020  Medication Sig  . acetaminophen (TYLENOL) 500 MG tablet Take 500 mg by mouth every 6 (six) hours as needed.  . bisoprolol-hydrochlorothiazide (ZIAC) 5-6.25 MG tablet Take 1 tablet by mouth daily.  . Carboxymethylcellulose Sodium (THERATEARS OP) Apply to eye.  . Cholecalciferol (VITAMIN D3) 5000 units TABS Take 5,000 Units by mouth daily.  Marland Kitchen dicyclomine (BENTYL) 10 MG capsule Take 10 mg by mouth 4 (four) times daily -  before meals and at bedtime.  Marland Kitchen escitalopram (LEXAPRO) 20 MG tablet Take 1 tablet (20 mg total) by mouth daily.  Marland Kitchen estradiol (ESTRACE) 0.1 MG/GM vaginal cream Use once a week as needed  . fluticasone (FLONASE) 50 MCG/ACT nasal spray  1 SPRAY IN EACH NOSTRIL ONCE ADAY AS NEEDED  . Liniments (SALONPAS PAIN RELIEF PATCH EX) Apply 1 patch topically 2 (two) times daily.  Marland Kitchen lubiprostone (AMITIZA) 8 MCG capsule Take 1 capsule po bid prn ibs constipation.  . meloxicam (MOBIC) 7.5 MG tablet Take 1 tablet (7.5 mg total) by mouth daily.  . mirtazapine (REMERON) 15 MG tablet TAKE ONE TABLET AT BEDTIME AS NEEDED FORSLEEP  . Multiple Vitamins-Minerals (CENTRUM SILVER 50+WOMEN) TABS Take by mouth.  . oxybutynin (DITROPAN-XL) 10 MG 24 hr tablet Take 1 tablet (10 mg total) by mouth daily.  . pantoprazole (PROTONIX) 40 MG tablet Take 1 tablet (40 mg total) by mouth daily.  Vladimir Faster Glycol-Propyl Glycol (SYSTANE) 0.4-0.3 % SOLN Apply to eye.  . promethazine (PHENERGAN) 25 MG tablet Take 25 mg by mouth as needed.   Marland Kitchen rOPINIRole (REQUIP) 1 MG tablet TAKE ONE TABLET 3 TIMES DAILY AS NEEDED FOR RESTLESS LEGS  . simethicone (MYLICON) 80 MG chewable tablet Chew 80 mg by mouth every 6 (six) hours as needed for flatulence.  Marland Kitchen XIIDRA 5 % SOLN Apply to eye. Sample given by eye dr  . [DISCONTINUED] fentaNYL (DURAGESIC) 12 MCG/HR Place 1 patch onto the skin every 3 (three) days. Must last 30 days.   No facility-administered encounter medications on file as of 03/07/2020.    Surgical History: Past Surgical History:  Procedure Laterality Date  . COLONOSCOPY  05-03-15  . COLONOSCOPY WITH PROPOFOL N/A 05/03/2015   Procedure: COLONOSCOPY WITH PROPOFOL;  Surgeon:  Lucilla Lame, MD;  Location: [facility];  Service: Endoscopy;  Laterality: N/A;  CPAP  . DILATION AND CURETTAGE OF UTERUS  1970  . ENDOMETRIAL BIOPSY    . EYE SURGERY Right 2017  . POLYPECTOMY  05/03/2015   Procedure: POLYPECTOMY;  Surgeon: Lucilla Lame, MD;  Location: [facility];  Service: Endoscopy;;  . ROTATOR CUFF REPAIR  2007  . SPINE SURGERY      Medical History: Past Medical History:  Diagnosis Date  . Arthritis   . Atrophic vaginitis 03/06/2013   Last  Assessment & Plan:  She will plan to continue estradiol vaginal cream.  Prescription was rewritten stipulating use of the generic product.   . Atypical migraine 03/06/2013   Last Assessment & Plan:  Since she rarely takes sumatriptan, we will discontinue it. Continue sparing use of OTC migraine products.   . Avitaminosis D 03/06/2013   Last Assessment & Plan:  Recheck vitamin D level. Plan to continue vitamin D supplementation.   . Ceratitis 08/01/2013   Last Assessment & Plan:  Because of the expense, she will discontinue Restasis. She will use over-the-counter moisturizing eyedrops and liquid gel.   . Colon polyp   . 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   . 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   . Cystocele, midline 03/06/2013  . Demoralization and apathy 03/06/2013   Last Assessment & Plan:  Since she has been taking bupropion only once a day, I will write the prescription with the correct instructions and correct quantity. She has done well on this for years and she is encouraged to take it regularly   . Depression   . Environmental allergies   . Epistaxis   . Essential (primary) hypertension 05/17/2013   Last Assessment & Plan:  Her blood pressure is well-controlled. We will plan to continue hydrochlorothiazide and benazepril.  Both are generic and should be affordable on her health plan.   Marland Kitchen GERD (gastroesophageal reflux disease)   . Hemorrhoid   . Inflammation of sacroiliac joint ([place]) 03/06/2013   Last Assessment & Plan:  She is doing well on her present regimen of fentanyl patch supplemented with sparing use of tramadol/acetaminophen. We will continue that regimen.   . Sinusitis   . Sleep apnea    CPAP    Family History: Family History  Problem Relation Age of Onset  . Arthritis Mother   .  Alzheimer's disease Father   . Heart disease Sister   . Cancer Brother   . Ovarian cancer Neg Hx   . Breast cancer Neg Hx   . Colon cancer Neg Hx   . Diabetes Neg Hx       Review of Systems  Constitutional: Positive for activity change and fatigue. Negative for chills and unexpected weight change.       Activity levels improving since her last visit   HENT: Negative for congestion, postnasal drip, rhinorrhea, sinus pressure, sinus pain, sneezing and sore throat.   Respiratory: Negative for cough, chest tightness, shortness of breath and wheezing.   Cardiovascular: Negative for chest pain and palpitations.  Gastrointestinal: Negative for abdominal pain, constipation, diarrhea, nausea and vomiting.  Endocrine: Negative for cold intolerance, heat intolerance, polydipsia and polyuria.  Musculoskeletal: Positive for arthralgias, back pain, gait problem and myalgias. Negative for joint swelling and neck  pain.       Severe lower back pain radiating into the hips.Started back in September, 2020. She admits some improvement in pain levels and ability to participate in routine activities   Skin: Negative for rash.  Neurological: Positive for weakness and headaches. Negative for tremors and numbness.  Hematological: Negative for adenopathy. Does not bruise/bleed easily.  Psychiatric/Behavioral: Negative for behavioral problems (Depression), sleep disturbance and suicidal ideas. The patient is nervous/anxious.        Patient is routinely seeing psychiatry.     Today's Vitals   03/07/20 1510  BP: (!) 144/78  Pulse: 61  Resp: 16  Temp: 97.8 F (36.6 C)  SpO2: 95%  Weight: 152 lb (68.9 kg)  Height: '5\' 5"'  (1.651 m)   Body mass index is 25.29 kg/m.  Physical Exam Vitals and nursing note reviewed.  Constitutional:      General: She is not in acute distress.    Appearance: Normal appearance. She is well-developed. She is not diaphoretic.  HENT:     Head: Normocephalic and atraumatic.      Nose: Nose normal.     Mouth/Throat:     Pharynx: No oropharyngeal exudate.  Eyes:     Pupils: Pupils are equal, round, and reactive to light.  Neck:     Thyroid: No thyromegaly.     Vascular: No carotid bruit or JVD.     Trachea: No tracheal deviation.  Cardiovascular:     Rate and Rhythm: Normal rate and regular rhythm.     Pulses: Normal pulses.     Heart sounds: Normal heart sounds. No murmur heard.  No friction rub. No gallop.   Pulmonary:     Effort: Pulmonary effort is normal. No respiratory distress.     Breath sounds: Normal breath sounds. No wheezing or rales.  Chest:     Chest wall: No tenderness.  Abdominal:     General: Bowel sounds are normal.     Palpations: Abdomen is soft.     Tenderness: There is no abdominal tenderness.  Musculoskeletal:        General: Normal range of motion.     Cervical back: Normal range of motion and neck supple.     Comments: She does have moderate lower back pain, worse with palpation and worse with sitting or standing for long periods of time. She is using a walking cane to help with mobility. She is reporting some improvement in symptoms since her last visit and since seeing pain management provider.   Lymphadenopathy:     Cervical: No cervical adenopathy.  Skin:    General: Skin is warm and dry.  Neurological:     Mental Status: She is alert and oriented to person, place, and time. Mental status is at baseline.     Cranial Nerves: No cranial nerve deficit.  Psychiatric:        Attention and Perception: Attention and perception normal.        Mood and Affect: Mood is anxious.        Speech: Speech normal.        Behavior: Behavior normal. Behavior is cooperative.        Thought Content: Thought content normal.        Cognition and Memory: Cognition and memory normal.        Judgment: Judgment normal.    Depression screen [facility] 2/9 03/07/2020 02/14/2020 01/19/2020 03/06/2019 11/08/2018  Decreased Interest 0 0 0 0 0  Down, Depressed,  Hopeless 3  0 0 0 0  PHQ - 2 Score 3 0 0 0 0    Functional Status Survey: Is the patient deaf or have difficulty hearing?: No (hearing aid right ear) Does the patient have difficulty seeing, even when wearing glasses/contacts?: Yes Does the patient have difficulty concentrating, remembering, or making decisions?: Yes Does the patient have difficulty walking or climbing stairs?: No Does the patient have difficulty dressing or bathing?: No Does the patient have difficulty doing errands alone such as visiting a doctor's office or shopping?: No  MMSE - Funk Exam 03/07/2020 03/06/2019 03/02/2018  Orientation to time '5 5 5  ' Orientation to Place '5 5 5  ' Registration '3 3 3  ' Attention/ Calculation '5 5 2  ' Recall '3 3 3  ' Language- name 2 objects '2 2 2  ' Language- repeat '1 1 1  ' Language- follow 3 step command '3 3 3  ' Language- read & follow direction '1 1 1  ' Write a sentence '1 1 1  ' Copy design '1 1 1  ' Total score '30 30 27    ' Fall Risk  03/19/2020 03/07/2020 02/14/2020 01/19/2020 03/06/2019  Falls in the past year? 1 1 0 0 0  Comment - - - - -  Number falls in past yr: 0 0 0 - -  Comment rolled out of the bed a few weeks ago. - - - -  Injury with Fall? 0 0 0 - -  Comment - hit head but was ok.  fell out of bed - - -  Risk for fall due to : Impaired mobility - No Fall Risks - -  Follow up Falls evaluation completed - Falls evaluation completed - -      LABS: Recent Results (from the past 2160 hour(s))  Compliance Drug Analysis, Ur     Status: None   Collection Time: 02/14/20 12:20 PM  Result Value Ref Range   Summary Note     Comment: ==================================================================== Compliance Drug Analysis, Ur ==================================================================== Test                             Result       Flag       Units  Drug Present and Declared for Prescription Verification   Fentanyl                       6            EXPECTED   ng/mg  creat   Norfentanyl                    32           EXPECTED   ng/mg creat    Source of fentanyl is a scheduled prescription medication, including    IV, patch, and transmucosal formulations. Norfentanyl is an expected    metabolite of fentanyl.    Citalopram                     PRESENT      EXPECTED   Desmethylcitalopram            PRESENT      EXPECTED    Desmethylcitalopram is an expected metabolite of citalopram or the    enantiomeric form, escitalopram.    Mirtazapine                    PRESENT  EXPECTED   Acetaminophen                  PRESENT      EXPECTED  Drug Absent but Declared for Prescription Verification    Promethazine                   Not Detected UNEXPECTED ==================================================================== Test                      Result    Flag   Units      Ref Range   Creatinine              199              mg/dL      >=20 ==================================================================== Declared Medications:  The flagging and interpretation on this report are based on the  following declared medications.  Unexpected results may arise from  inaccuracies in the declared medications.   **Note: The testing scope of this panel includes these medications:   Escitalopram (Lexapro)  Fentanyl (Duragesic)  Mirtazapine (Remeron)  Promethazine (Phenergan)   **Note: The testing scope of this panel does not include small to  moderate amounts of these reported medications:   Acetaminophen (Tylenol)   **Note: The testing scope of this panel does not include the  following reported medications:   Bisoprolol  Dicyclomine (Bentyl)  Estradiol (Est race)  Eye Drop  Fluticasone (Flonase)  Hydrochlorothiazide  Lubiprostone (Amitiza)  Meloxicam (Mobic)  Multivitamin (Centrum)  Oxybutynin (Ditropan)  Pantoprazole (Protonix)  Ropinirole (Requip)  Simethicone  Topical  Vitamin  D3 ==================================================================== For clinical consultation, please call [phone]. ====================================================================   Comp. Metabolic Panel (12)     Status: Abnormal   Collection Time: 02/14/20 12:25 PM  Result Value Ref Range   Glucose 103 (H) 65 - 99 mg/dL   BUN 19 8 - 27 mg/dL   Creatinine, Ser 1.12 (H) 0.57 - 1.00 mg/dL   GFR calc non Af Amer 44 (L) >59 mL/min/1.73   GFR calc Af Amer 50 (L) >59 mL/min/1.73    Comment: **[facility] currently reports eGFR in compliance with the current**   recommendations of the [employer]. [facility] will   update reporting as new guidelines are published from the NKF-ASN   Task force.    BUN/Creatinine Ratio 17 12 - 28   Sodium 144 134 - 144 mmol/L   Potassium 5.3 (H) 3.5 - 5.2 mmol/L   Chloride 103 96 - 106 mmol/L   Calcium 10.4 (H) 8.7 - 10.3 mg/dL   Total Protein 7.6 6.0 - 8.5 g/dL   Albumin 4.9 (H) 3.6 - 4.6 g/dL   Globulin, Total 2.7 1.5 - 4.5 g/dL   Albumin/Globulin Ratio 1.8 1.2 - 2.2   Bilirubin Total 0.4 0.0 - 1.2 mg/dL   Alkaline Phosphatase 76 48 - 121 IU/L   AST 29 0 - 40 IU/L  Magnesium     Status: None   Collection Time: 02/14/20 12:25 PM  Result Value Ref Range   Magnesium 2.3 1.6 - 2.3 mg/dL  25-Hydroxy vitamin D Lcms D2+D3     Status: None   Collection Time: 02/14/20 12:25 PM  Result Value Ref Range   25-Hydroxy, Vitamin D 84 ng/mL    Comment: Reference Range: All Ages: Target levels 30 - 100    25-Hydroxy, Vitamin D-2 <1.0 ng/mL    Comment: This test was developed and its performance characteristics determined by [facility]. It has not  been cleared or approved by the Food and Drug Administration.    25-Hydroxy, Vitamin D-3 84 ng/mL    Comment: This test was developed and its performance characteristics determined by [facility]. It has not been cleared or approved by the Food and Drug Administration.     Assessment/Plan: 1.  Encounter for general adult medical examination with abnormal findings Annual health maintenance exam today.   2. Essential (primary) hypertension Stable. Continue bp medication as prescribed   3. Hyperkalemia Labs done per pain management showed mild elevation of potassium level. Will recheck   4. Hypercalcemia Labs done per pain management showed mild elevation of calcium level. Will recheck    5. Lumbar facet arthropathy Improved pain with addition of low dose fentanyl patch. Patient now seeing pain management.   6. Abnormal kidney function Recheck BMP  7. MDD (major depressive disorder), recurrent, in partial remission ([place]) Continue regular visits with psychiatry.   General Counseling: raziyah vanvleck understanding of the findings of todays visit and agrees with plan of treatment. I have discussed any further diagnostic evaluation that may be needed or ordered today. We also reviewed her medications today. she has been encouraged to call the office with any questions or concerns that should arise related to todays visit.    Counseling:  This patient was seen by Leretha Pol FNP Collaboration with Dr Lavera Guise as a part of collaborative care agreement   Total time spent: 36 Minutes  Time spent includes review of chart, medications, test results, and follow up plan with the patient.     Lavera Guise, MD  Internal Medicine

## 2020-03-17 NOTE — Patient Instructions (Signed)
____________________________________________________________________________________________  Post-Procedure Discharge Instructions  Instructions:  Apply ice:   Purpose: This will minimize any swelling and discomfort after procedure.   When: Day of procedure, as soon as you get home.  How: Fill a plastic sandwich bag with crushed ice. Cover it with a small towel and apply to injection site.  How long: (15 min on, 15 min off) Apply for 15 minutes then remove x 15 minutes.  Repeat sequence on day of procedure, until you go to bed.  Apply heat:   Purpose: To treat any soreness and discomfort from the procedure.  When: Starting the next day after the procedure.  How: Apply heat to procedure site starting the day following the procedure.  How long: May continue to repeat daily, until discomfort goes away.  Food intake: Start with clear liquids (like water) and advance to regular food, as tolerated.   Physical activities: Keep activities to a minimum for the first 8 hours after the procedure. After that, then as tolerated.  Driving: If you have received any sedation, be responsible and do not drive. You are not allowed to drive for 24 hours after having sedation.  Blood thinner: (Applies only to those taking blood thinners) You may restart your blood thinner 6 hours after your procedure.  Insulin: (Applies only to Diabetic patients taking insulin) As soon as you can eat, you may resume your normal dosing schedule.  Infection prevention: Keep procedure site clean and dry. Shower daily and clean area with soap and water.  Post-procedure Pain Diary: Extremely important that this be done correctly and accurately. Recorded information will be used to determine the next step in treatment. For the purpose of accuracy, follow these rules:  Evaluate only the area treated. Do not report or include pain from an untreated area. For the purpose of this evaluation, ignore all other areas of pain,  except for the treated area.  After your procedure, avoid taking a long nap and attempting to complete the pain diary after you wake up. Instead, set your alarm clock to go off every hour, on the hour, for the initial 8 hours after the procedure. Document the duration of the numbing medicine, and the relief you are getting from it.  Do not go to sleep and attempt to complete it later. It will not be accurate. If you received sedation, it is likely that you were given a medication that may cause amnesia. Because of this, completing the diary at a later time may cause the information to be inaccurate. This information is needed to plan your care.  Follow-up appointment: Keep your post-procedure follow-up evaluation appointment after the procedure (usually 2 weeks for most procedures, 6 weeks for radiofrequencies). DO NOT FORGET to bring you pain diary with you.   Expect: (What should I expect to see with my procedure?)  From numbing medicine (AKA: Local Anesthetics): Numbness or decrease in pain. You may also experience some weakness, which if present, could last for the duration of the local anesthetic.  Onset: Full effect within 15 minutes of injected.  Duration: It will depend on the type of local anesthetic used. On the average, 1 to 8 hours.   From steroids (Applies only if steroids were used): Decrease in swelling or inflammation. Once inflammation is improved, relief of the pain will follow.  Onset of benefits: Depends on the amount of swelling present. The more swelling, the longer it will take for the benefits to be seen. In some cases, up to 10 days.    Duration: Steroids will stay in the system x 2 weeks. Duration of benefits will depend on multiple posibilities including persistent irritating factors.  Side-effects: If present, they may typically last 2 weeks (the duration of the steroids).  Frequent: Cramps (if they occur, drink Gatorade and take over-the-counter Magnesium 450-500 mg  once to twice a day); water retention with temporary weight gain; increases in blood sugar; decreased immune system response; increased appetite.  Occasional: Facial flushing (red, warm cheeks); mood swings; menstrual changes.  Uncommon: Long-term decrease or suppression of natural hormones; bone thinning. (These are more common with higher doses or more frequent use. This is why we prefer that our patients avoid having any injection therapies in other practices.)   Very Rare: Severe mood changes; psychosis; aseptic necrosis.  From procedure: Some discomfort is to be expected once the numbing medicine wears off. This should be minimal if ice and heat are applied as instructed.  Call if: (When should I call?)  You experience numbness and weakness that gets worse with time, as opposed to wearing off.  New onset bowel or bladder incontinence. (Applies only to procedures done in the spine)  Emergency Numbers:  Durning business hours (Monday - Thursday, 8:00 AM - 4:00 PM) (Friday, 9:00 AM - 12:00 Noon): [phone]  After hours: [phone]  NOTE: If you are having a problem and are unable connect with, or to talk to a provider, then go to your nearest urgent care or emergency department. If the problem is serious and urgent, please call 911. ____________________________________________________________________________________________   ____________________________________________________________________________________________  Medication Rules  Purpose: To inform patients, and their family members, of our rules and regulations.  Applies to: All patients receiving prescriptions (written or electronic).  Pharmacy of record: Pharmacy where electronic prescriptions will be sent. If written prescriptions are taken to a different pharmacy, please inform the nursing staff. The pharmacy listed in the electronic medical record should be the one where you would like electronic prescriptions  to be sent.  Electronic prescriptions: In compliance with the [place] Strengthen Opioid Misuse Prevention (STOP) Act of 2017 (Session Law 2017-74/H243), effective July 13, 2018, all controlled substances must be electronically prescribed. Calling prescriptions to the pharmacy will cease to exist.  Prescription refills: Only during scheduled appointments. Applies to all prescriptions.  NOTE: The following applies primarily to controlled substances (Opioid* Pain Medications).   Type of encounter (visit): For patients receiving controlled substances, face-to-face visits are required. (Not an option or up to the patient.)  Patient's responsibilities: 1. Pain Pills: Bring all pain pills to every appointment (except for procedure appointments). 2. Pill Bottles: Bring pills in original pharmacy bottle. Always bring the newest bottle. Bring bottle, even if empty. 3. Medication refills: You are responsible for knowing and keeping track of what medications you take and those you need refilled. The day before your appointment: write a list of all prescriptions that need to be refilled. The day of the appointment: give the list to the admitting nurse. Prescriptions will be written only during appointments. No prescriptions will be written on procedure days. If you forget a medication: it will not be "Called in", "Faxed", or "electronically sent". You will need to get another appointment to get these prescribed. No early refills. Do not call asking to have your prescription filled early. 4. Prescription Accuracy: You are responsible for carefully inspecting your prescriptions before leaving our office. Have the discharge nurse carefully go over each prescription with you, before taking them home. Make sure that   your name is accurately spelled, that your address is correct. Check the name and dose of your medication to make sure it is accurate. Check the number of pills, and the written instructions to  make sure they are clear and accurate. Make sure that you are given enough medication to last until your next medication refill appointment. 5. Taking Medication: Take medication as prescribed. When it comes to controlled substances, taking less pills or less frequently than prescribed is permitted and encouraged. Never take more pills than instructed. Never take medication more frequently than prescribed.  6. Inform other Doctors: Always inform, all of your healthcare providers, of all the medications you take. 7. Pain Medication from other Providers: You are not allowed to accept any additional pain medication from any other Doctor or Healthcare provider. There are two exceptions to this rule. (see below) In the event that you require additional pain medication, you are responsible for notifying us, as stated below. 8. Medication Agreement: You are responsible for carefully reading and following our Medication Agreement. This must be signed before receiving any prescriptions from our practice. Safely store a copy of your signed Agreement. Violations to the Agreement will result in no further prescriptions. (Additional copies of our Medication Agreement are available upon request.) 9. Laws, Rules, & Regulations: All patients are expected to follow all Federal and [employer], [employer], Rules, [employer] [facility]. Ignorance of the Laws does not constitute a valid excuse.  10. Illegal drugs and Controlled Substances: The use of illegal substances (including, but not limited to marijuana and its derivatives) and/or the illegal use of any controlled substances is strictly prohibited. Violation of this rule may result in the immediate and permanent discontinuation of any and all prescriptions being written by our practice. The use of any illegal substances is prohibited. 11. Adopted CDC guidelines & recommendations: Target dosing levels will be at or below 60 MME/day. Use of benzodiazepines** is not  recommended.  Exceptions: There are only two exceptions to the rule of not receiving pain medications from other Healthcare Providers. 1. Exception #1 (Emergencies): In the event of an emergency (i.e.: accident requiring emergency care), you are allowed to receive additional pain medication. However, you are responsible for: As soon as you are able, call our office [phone], at any time of the day or night, and leave a message stating your name, the date and nature of the emergency, and the name and dose of the medication prescribed. In the event that your call is answered by a member of our staff, make sure to document and save the date, time, and the name of the person that took your information.  2. Exception #2 (Planned Surgery): In the event that you are scheduled by another doctor or dentist to have any type of surgery or procedure, you are allowed (for a period no longer than 30 days), to receive additional pain medication, for the acute post-op pain. However, in this case, you are responsible for picking up a copy of our "Post-op Pain Management for Surgeons" handout, and giving it to your surgeon or dentist. This document is available at our office, and does not require an appointment to obtain it. Simply go to our office during business hours (Monday-Thursday from 8:00 AM to 4:00 PM) (Friday 8:00 AM to 12:00 Noon) or if you have a scheduled appointment with [place], prior to your surgery, and ask for it by name. In addition, you will need to provide [place] with your name, name of your surgeon,  type of surgery, and date of procedure or surgery.  *Opioid medications include: morphine, codeine, oxycodone, oxymorphone, hydrocodone, hydromorphone, meperidine, tramadol, tapentadol, buprenorphine, fentanyl, methadone. **Benzodiazepine medications include: diazepam (Valium), alprazolam (Xanax), clonazepam (Klonopine), lorazepam (Ativan), clorazepate (Tranxene), chlordiazepoxide (Librium), estazolam (Prosom),  oxazepam (Serax), temazepam (Restoril), triazolam (Halcion) (Last updated: 09/09/2017) ____________________________________________________________________________________________   ____________________________________________________________________________________________  Medication Recommendations and Reminders  Applies to: All patients receiving prescriptions (written and/or electronic).  Medication Rules & Regulations: These rules and regulations exist for your safety and that of others. They are not flexible and neither are we. Dismissing or ignoring them will be considered "non-compliance" with medication therapy, resulting in complete and irreversible termination of such therapy. (See document titled "Medication Rules" for more details.) In all conscience, because of safety reasons, we cannot continue providing a therapy where the patient does not follow instructions.  Pharmacy of record:   Definition: This is the pharmacy where your electronic prescriptions will be sent.   We do not endorse any particular pharmacy, however, we have experienced problems with [facility] not securing enough medication supply for the community.  We do not restrict you in your choice of pharmacy. However, once we write for your prescriptions, we will NOT be re-sending more prescriptions to fix restricted supply problems created by your pharmacy, or your insurance.   The pharmacy listed in the electronic medical record should be the one where you want electronic prescriptions to be sent.  If you choose to change pharmacy, simply notify our nursing staff.  Recommendations:  Keep all of your pain medications in a safe place, under lock and key, even if you live alone. We will NOT replace lost, stolen, or damaged medication.  After you fill your prescription, take 1 week's worth of pills and put them away in a safe place. You should keep a separate, properly labeled bottle for this purpose. The remainder  should be kept in the original bottle. Use this as your primary supply, until it runs out. Once it's gone, then you know that you have 1 week's worth of medicine, and it is time to come in for a prescription refill. If you do this correctly, it is unlikely that you will ever run out of medicine.  To make sure that the above recommendation works, it is very important that you make sure your medication refill appointments are scheduled at least 1 week before you run out of medicine. To do this in an effective manner, make sure that you do not leave the office without scheduling your next medication management appointment. Always ask the nursing staff to show you in your prescription , when your medication will be running out. Then arrange for the receptionist to get you a return appointment, at least 7 days before you run out of medicine. Do not wait until you have 1 or 2 pills left, to come in. This is very poor planning and does not take into consideration that we may need to cancel appointments due to bad weather, sickness, or emergencies affecting our staff.  DO NOT ACCEPT A "Partial Fill": If for any reason your pharmacy does not have enough pills/tablets to completely fill or refill your prescription, do not allow for a "partial fill". The law allows the pharmacy to complete that prescription within 72 hours, without requiring a new prescription. If they do not fill the rest of your prescription within those 72 hours, you will need a separate prescription to fill the remaining amount, which we will NOT provide. If the reason  for the partial fill is your insurance, you will need to talk to the pharmacist about payment alternatives for the remaining tablets, but again, DO NOT ACCEPT A PARTIAL FILL, unless you can trust your pharmacist to obtain the remainder of the pills within 72 hours.  Prescription refills and/or changes in medication(s):   Prescription refills, and/or changes in dose or medication,  will be conducted only during scheduled medication management appointments. (Applies to both, written and electronic prescriptions.)  No refills on procedure days. No medication will be changed or started on procedure days. No changes, adjustments, and/or refills will be conducted on a procedure day. Doing so will interfere with the diagnostic portion of the procedure.  No phone refills. No medications will be "called into the pharmacy".  No Fax refills.  No weekend refills.  No Holliday refills.  No after hours refills.  Remember:  Business hours are:  Monday to Thursday 8:00 AM to 4:00 PM Provider's Schedule: Milinda Pointer, MD - Appointments are:  Medication management: Monday and Wednesday 8:00 AM to 4:00 PM Procedure day: Tuesday and Thursday 7:30 AM to 4:00 PM Gillis Santa, MD - Appointments are:  Medication management: Tuesday and Thursday 8:00 AM to 4:00 PM Procedure day: Monday and Wednesday 7:30 AM to 4:00 PM (Last update: 01/31/2020) ____________________________________________________________________________________________   ____________________________________________________________________________________________  CBD (cannabidiol) WARNING  Applicable to: All individuals currently taking or considering taking CBD (cannabidiol) and, more important, all patients taking opioid analgesic controlled substances (pain medication). (Example: oxycodone; oxymorphone; hydrocodone; hydromorphone; morphine; methadone; tramadol; tapentadol; fentanyl; buprenorphine; butorphanol; dextromethorphan; meperidine; codeine; etc.)  Legal status: CBD remains a Schedule I drug prohibited for any use. CBD is illegal with one exception. In the [place], CBD has a limited Transport planner (FDA) approval for the treatment of two specific types of epilepsy disorders. Only one CBD product has been approved by the FDA for this purpose: "Epidiolex". FDA is aware that some  companies are marketing products containing cannabis and cannabis-derived compounds in ways that violate the [employer], Drug and Cosmetic Act [facility] Act) and that may put the health and safety of consumers at risk. The FDA, a Federal agency, has not enforced the CBD status since 2018.   Legality: Some manufacturers ship CBD products nationally, which is illegal. Often such products are sold online and are therefore available throughout the country. CBD is openly sold in head shops and health food stores in some states where such sales have not been explicitly legalized. Selling unapproved products with unsubstantiated therapeutic claims is not only a violation of the law, but also can put patients at risk, as these products have not been proven to be safe or effective. Federal illegality makes it difficult to conduct research on CBD.  Reference: "FDA Regulation of Cannabis and Cannabis-Derived Products, Including Cannabidiol (CBD)" - [employer]  Warning: CBD is not FDA approved and has not undergo the same manufacturing controls as prescription drugs.  This means that the purity and safety of available CBD may be questionable. Most of the time, despite manufacturer's claims, it is contaminated with THC (delta-9-tetrahydrocannabinol - the chemical in marijuana responsible for the "HIGH").  When this is the case, the [facility] contaminant will trigger a positive urine drug screen (UDS) test for Marijuana (carboxy-THC). Because a positive UDS for any illicit substance is a violation of our medication agreement, your opioid analgesics (pain medicine) may be permanently discontinued.  MORE ABOUT CBD  General Information: CBD  is a derivative of the Marijuana (cannabis  sativa) plant discovered in 1. It is one of the 113 identified substances found in Marijuana. It accounts for up to 40% of the  plant's extract. As of 2018, preliminary clinical studies on CBD included research for the treatment of anxiety, movement disorders, and pain. CBD is available and consumed in multiple forms, including inhalation of smoke or vapor, as an aerosol spray, and by mouth. It may be supplied as an oil containing CBD, capsules, dried cannabis, or as a liquid solution. CBD is thought not to be as psychoactive as THC (delta-9-tetrahydrocannabinol - the chemical in marijuana responsible for the "HIGH"). Studies suggest that CBD may interact with different biological target receptors in the body, including cannabinoid and other neurotransmitter receptors. As of 2018 the mechanism of action for its biological effects has not been determined.  Side-effects  Adverse reactions: Dry mouth, diarrhea, decreased appetite, fatigue, drowsiness, malaise, weakness, sleep disturbances, and others.  Drug interactions: CBC may interact with other medications such as blood-thinners. (Last update: 02/17/2020) ____________________________________________________________________________________________

## 2020-03-17 NOTE — Progress Notes (Signed)
PROVIDER NOTE: Information contained herein reflects review and annotations entered in association with encounter. Interpretation of such information and data should be left to medically-trained personnel. Information provided to patient can be located elsewhere in the medical record under "Patient Instructions". Document created using STT-dictation technology, any transcriptional errors that may result from process are unintentional.    Patient: Kristen Pennington  Service Category: Procedure  Provider: Gaspar Cola, MD  DOB: 12/19/1928  DOS: 03/19/2020  Location: [facility]  MRN: [id number]  Setting: Ambulatory - outpatient  Referring Provider: Lavera Guise, MD  Type: Established Patient  Specialty: Interventional Pain Management  PCP: Lavera Guise, MD   Primary Reason for Visit: Interventional Pain Management Treatment. CC: Hip Pain  Procedure:          Anesthesia, Analgesia, Anxiolysis:  Type: Intra-Articular Hip Injection #1  Primary Purpose: Diagnostic Region: Posterolateral hip joint area. Level: Lower pelvic and hip joint level. Target Area: Superior aspect of the hip joint cavity, going thru the superior portion of the capsular ligament. Approach: Posterolateral approach. Laterality: Bilateral  Type: Local Anesthesia Indication(s): Analgesia         Route: Infiltration ([place]/IM) IV Access: Declined Sedation: Declined  Local Anesthetic: Lidocaine 1-2%  Position: Prone Prepped Area: Entire Posterolateral hip area. DuraPrep (Iodine Povacrylex [0.7% available iodine] and Isopropyl Alcohol, 74% w/w)   Indications: 1. Chronic hip pain (2ry area of Pain) (Bilateral) (R>L)   2. Osteoarthritis of hips (Bilateral)   3. Primary generalized (osteo)arthritis   4. Primary osteoarthritis involving multiple joints    Pain Score: Pre-procedure: 4 /10 Post-procedure: 0-No pain/10   The patient had her Duragesic refilled on 02/17/2020 and therefore she should have  enough to last until today. RTC for MM 05/18/2020.  Pharmacotherapy Assessment   Analgesic: Duragesic 12.5 mcg/h every 72 hours Highest recorded MME/day: 28.8 mg/day MME/day: 28.8 mg/day   Monitoring: [place] PMP: PDMP reviewed during this encounter.       Pharmacotherapy: No side-effects or adverse reactions reported. Compliance: No problems identified. Effectiveness: Clinically acceptable.  UDS:  Summary  Date Value Ref Range Status  02/14/2020 Note  Final    Comment:    ==================================================================== Compliance Drug Analysis, Ur ==================================================================== Test                             Result       Flag       Units  Drug Present and Declared for Prescription Verification   Fentanyl                       6            EXPECTED   ng/mg creat   Norfentanyl                    32           EXPECTED   ng/mg creat    Source of fentanyl is a scheduled prescription medication, including    IV, patch, and transmucosal formulations. Norfentanyl is an expected    metabolite of fentanyl.    Citalopram                     PRESENT      EXPECTED   Desmethylcitalopram            PRESENT      EXPECTED    Desmethylcitalopram  is an expected metabolite of citalopram or the    enantiomeric form, escitalopram.    Mirtazapine                    PRESENT      EXPECTED   Acetaminophen                  PRESENT      EXPECTED  Drug Absent but Declared for Prescription Verification   Promethazine                   Not Detected UNEXPECTED ==================================================================== Test                      Result    Flag   Units      Ref Range   Creatinine              199              mg/dL      >=20 ==================================================================== Declared Medications:  The flagging and interpretation on this report are based on the  following declared medications.  Unexpected  results may arise from  inaccuracies in the declared medications.   **Note: The testing scope of this panel includes these medications:   Escitalopram (Lexapro)  Fentanyl (Duragesic)  Mirtazapine (Remeron)  Promethazine (Phenergan)   **Note: The testing scope of this panel does not include small to  moderate amounts of these reported medications:   Acetaminophen (Tylenol)   **Note: The testing scope of this panel does not include the  following reported medications:   Bisoprolol  Dicyclomine (Bentyl)  Estradiol (Estrace)  Eye Drop  Fluticasone (Flonase)  Hydrochlorothiazide  Lubiprostone (Amitiza)  Meloxicam (Mobic)  Multivitamin (Centrum)  Oxybutynin (Ditropan)  Pantoprazole (Protonix)  Ropinirole (Requip)  Simethicone  Topical  Vitamin D3 ==================================================================== For clinical consultation, please call [phone]. ====================================================================       Pre-op Assessment:  Kristen Pennington is a 86 y.o. (year old), female patient, seen today for interventional treatment. She  has a past surgical history that includes Spine surgery; Rotator cuff repair (2007); Dilation and curettage of uterus (1970); Colonoscopy with propofol (N/A, 05/03/2015); polypectomy (05/03/2015); Colonoscopy (05-03-15); Eye surgery (Right, 2017); and Endometrial biopsy. Kristen Pennington has a current medication list which includes the following prescription(s): acetaminophen, bisoprolol-hydrochlorothiazide, carboxymethylcellulose sodium, vitamin d3, dicyclomine, escitalopram, estradiol, fluticasone, menthol-methyl salicylate, lubiprostone, meloxicam, mirtazapine, centrum silver 50+women, oxybutynin, pantoprazole, systane, promethazine, ropinirole, simethicone, xiidra, fentanyl, and [START ON 04/18/2020] fentanyl. Her primarily concern today is the Hip Pain  Initial Vital Signs:  Pulse/HCG Rate: 66  Temp: (!) 97.5 F (36.4  C) Resp: 16 BP: 129/73 SpO2: 99 %  BMI: Estimated body mass index is 26.09 kg/m as calculated from the following:   Height as of this encounter: 5\' 4"  (1.626 m).   Weight as of this encounter: 152 lb (68.9 kg).  Risk Assessment: Allergies: Reviewed. She has No Known Allergies.  Allergy Precautions: None required Coagulopathies: Reviewed. None identified.  Blood-thinner therapy: None at this time Active Infection(s): Reviewed. None identified. Ms. Goodroe is afebrile  Site Confirmation: Ms. Boniface was asked to confirm the procedure and laterality before marking the site Procedure checklist: Completed Consent: Before the procedure and under the influence of no sedative(s), amnesic(s), or anxiolytics, the patient was informed of the treatment options, risks and possible complications. To fulfill our ethical and legal obligations, as recommended by the American Medical Association's Code of Ethics, I have informed  the patient of my clinical impression; the nature and purpose of the treatment or procedure; the risks, benefits, and possible complications of the intervention; the alternatives, including doing nothing; the risk(s) and benefit(s) of the alternative treatment(s) or procedure(s); and the risk(s) and benefit(s) of doing nothing. The patient was provided information about the general risks and possible complications associated with the procedure. These may include, but are not limited to: failure to achieve desired goals, infection, bleeding, organ or nerve damage, allergic reactions, paralysis, and death. In addition, the patient was informed of those risks and complications associated to the procedure, such as failure to decrease pain; infection; bleeding; organ or nerve damage with subsequent damage to sensory, motor, and/or autonomic systems, resulting in permanent pain, numbness, and/or weakness of one or several areas of the body; allergic reactions; (i.e.: anaphylactic  reaction); and/or death. Furthermore, the patient was informed of those risks and complications associated with the medications. These include, but are not limited to: allergic reactions (i.e.: anaphylactic or anaphylactoid reaction(s)); adrenal axis suppression; blood sugar elevation that in diabetics may result in ketoacidosis or comma; water retention that in patients with history of congestive heart failure may result in shortness of breath, pulmonary edema, and decompensation with resultant heart failure; weight gain; swelling or edema; medication-induced neural toxicity; particulate matter embolism and blood vessel occlusion with resultant organ, and/or nervous system infarction; and/or aseptic necrosis of one or more joints. Finally, the patient was informed that Medicine is not an exact science; therefore, there is also the possibility of unforeseen or unpredictable risks and/or possible complications that may result in a catastrophic outcome. The patient indicated having understood very clearly. We have given the patient no guarantees and we have made no promises. Enough time was given to the patient to ask questions, all of which were answered to the patient's satisfaction. Ms. Phetteplace has indicated that she wanted to continue with the procedure. Attestation: I, the ordering provider, attest that I have discussed with the patient the benefits, risks, side-effects, alternatives, likelihood of achieving goals, and potential problems during recovery for the procedure that I have provided informed consent. Date  Time: 03/19/2020  9:22 AM  Pre-Procedure Preparation:  Monitoring: As per clinic protocol. Respiration, ETCO2, SpO2, BP, heart rate and rhythm monitor placed and checked for adequate function Safety Precautions: Patient was assessed for positional comfort and pressure points before starting the procedure. Time-out: I initiated and conducted the "Time-out" before starting the procedure, as  per protocol. The patient was asked to participate by confirming the accuracy of the "Time Out" information. Verification of the correct person, site, and procedure were performed and confirmed by me, the nursing staff, and the patient. "Time-out" conducted as per Joint Commission's Universal Protocol (UP.01.01.01). Time: 1010  Description of Procedure:          Safety Precautions: Aspiration looking for blood return was conducted prior to all injections. At no point did we inject any substances, as a needle was being advanced. No attempts were made at seeking any paresthesias. Safe injection practices and needle disposal techniques used. Medications properly checked for expiration dates. SDV (single dose vial) medications used. Description of the Procedure: Protocol guidelines were followed. The patient was placed in position over the fluoroscopy table. The target area was identified and the area prepped in the usual manner. Skin & deeper tissues infiltrated with local anesthetic. Appropriate amount of time allowed to pass for local anesthetics to take effect. The procedure needles were then advanced to the target  area. Proper needle placement secured. Negative aspiration confirmed. Solution injected in intermittent fashion, asking for systemic symptoms every 0.5cc of injectate. The needles were then removed and the area cleansed, making sure to leave some of the prepping solution back to take advantage of its long term bactericidal properties. Vitals:   03/19/20 1005 03/19/20 1010 03/19/20 1015 03/19/20 1022  BP: (!) 151/74 (!) 163/79 (!) 162/71 (!) 156/69  Pulse: 60 (!) 59 61 60  Resp: 12     Temp:      TempSrc:      SpO2: 97% 97% 96% 97%  Weight:      Height:        Start Time: 1010 hrs. End Time: 1018 hrs. Materials:  Needle(s) Type: Spinal Needle Gauge: 22G Length: 5.0-in Medication(s): Please see orders for medications and dosing details.  Imaging Guidance (Non-Spinal):          Type  of Imaging Technique: Fluoroscopy Guidance (Non-Spinal) Indication(s): Assistance in needle guidance and placement for procedures requiring needle placement in or near specific anatomical locations not easily accessible without such assistance. Exposure Time: Please see nurses notes. Contrast: Before injecting any contrast, we confirmed that the patient did not have an allergy to iodine, shellfish, or radiological contrast. Once satisfactory needle placement was completed at the desired level, radiological contrast was injected. Contrast injected under live fluoroscopy. No contrast complications. See chart for type and volume of contrast used. Fluoroscopic Guidance: I was personally present during the use of fluoroscopy. "Tunnel Vision Technique" used to obtain the best possible view of the target area. Parallax error corrected before commencing the procedure. "Direction-depth-direction" technique used to introduce the needle under continuous pulsed fluoroscopy. Once target was reached, antero-posterior, oblique, and lateral fluoroscopic projection used confirm needle placement in all planes. Images permanently stored in EMR. Interpretation: I personally interpreted the imaging intraoperatively. Adequate needle placement confirmed in multiple planes. Appropriate spread of contrast into desired area was observed. No evidence of afferent or efferent intravascular uptake. Permanent images saved into the patient's record.  Antibiotic Prophylaxis:   Anti-infectives (From admission, onward)   None     Indication(s): None identified  Post-operative Assessment:  Post-procedure Vital Signs:  Pulse/HCG Rate: 60 (sb)  Temp: (!) 97.5 F (36.4 C) Resp: 12 BP: (!) 156/69 SpO2: 97 %  EBL: None  Complications: No immediate post-treatment complications observed by team, or reported by patient.  Note: The patient tolerated the entire procedure well. A repeat set of vitals were taken after the procedure and  the patient was kept under observation following institutional policy, for this type of procedure. Post-procedural neurological assessment was performed, showing return to baseline, prior to discharge. The patient was provided with post-procedure discharge instructions, including a section on how to identify potential problems. Should any problems arise concerning this procedure, the patient was given instructions to immediately contact us, at any time, without hesitation. In any case, we plan to contact the patient by telephone for a follow-up status report regarding this interventional procedure.  Comments:  No additional relevant information.  Plan of Care  Orders:  Orders Placed This Encounter  Procedures  . HIP INJECTION    Scheduling Instructions:     Side: Bilateral     Sedation: Patient's choice.     Timeframe: Today  . [facility] C-ARM 1-60 MIN NO REPORT    Intraoperative interpretation by procedural physician at [facility].    Standing Status:   Standing    Number of Occurrences:  1    Order Specific Question:   Reason for exam:    Answer:   Assistance in needle guidance and placement for procedures requiring needle placement in or near specific anatomical locations not easily accessible without such assistance.  . Informed Consent Details: Physician/Practitioner Attestation; Transcribe to consent form and obtain patient signature    Nursing Order: Transcribe to consent form and obtain patient signature. Note: Always confirm laterality of pain with Ms. Baldridge, before procedure. Procedure: Hip injection Indication/Reason: Hip Joint Pain (Arthralgia) Provider Attestation: I, [place] Dossie Arbour, MD, (Pain Management Specialist), the physician/practitioner, attest that I have discussed with the patient the benefits, risks, side effects, alternatives, likelihood of achieving goals and potential problems during recovery for the procedure that I have provided  informed consent.  . Provide equipment / supplies at bedside    Equipment required: Single use, disposable, "Block Tray"    Standing Status:   Standing    Number of Occurrences:   1    Order Specific Question:   Specify    Answer:   Block Tray   Chronic Opioid Analgesic:  Duragesic 12.5 mcg/h every 72 hours Highest recorded MME/day: 28.8 mg/day MME/day: 28.8 mg/day   Medications ordered for procedure: Meds ordered this encounter  Medications  . iohexol (OMNIPAQUE) 180 MG/ML injection 10 mL    Must be Myelogram-compatible. If not available, you may substitute with a water-soluble, non-ionic, hypoallergenic, myelogram-compatible radiological contrast medium.  Mar[place] Kitchen lidocaine (XYLOCAINE) 2 % (with pres) injection 400 mg  . ropivacaine (PF) 2 mg/mL (0.2%) (NAROPIN) injection 9 mL  . methylPREDNISolone acetate (DEPO-MEDROL) injection 80 mg  . fentaNYL (DURAGESIC) 12 MCG/HR    Sig: Place 1 patch onto the skin every 3 (three) days. Must last 30 days.    Dispense:  10 patch    Refill:  0    Chronic Pain: STOP Act (Not applicable) Fill 1 day early if closed on refill date. Do not fill until: 03/19/2020. To last until: 04/18/2020. Avoid benzodiazepines within 8 hours of opioids  . fentaNYL (DURAGESIC) 12 MCG/HR    Sig: Place 1 patch onto the skin every 3 (three) days. Must last 30 days.    Dispense:  10 patch    Refill:  0    Chronic Pain: STOP Act (Not applicable) Fill 1 day early if closed on refill date. Do not fill until: 04/18/2020. To last until: 05/18/2020. Avoid benzodiazepines within 8 hours of opioids   Medications administered: We administered iohexol, lidocaine, ropivacaine (PF) 2 mg/mL (0.2%), and methylPREDNISolone acetate.  See the medical record for exact dosing, route, and time of administration.  Follow-up plan:   Return for (20-min), (F2F), (PP) Follow-up.       Interventional management options: Planned, scheduled, and/or pending:    Diagnostic bilateral IA hip joint  injection #1 (today) Diagnostic bilateral lumbar facet block #1 (Initially denied by insurance)   Considering:   Diagnostic bilateral lumbar facet block  Diagnostic bilateral SI joint block  Diagnostic bilateral hip joint injection  Diagnostic midline L3-4 LESI  Diagnostic midline caudal ESI    PRN Procedures:   None at this time    Recent Visits Date Type Provider Dept  02/14/20 Office Visit Milinda Pointer, MD [facility]  Showing recent visits within past 90 days and meeting all other requirements Today's Visits Date Type Provider Dept  03/19/20 Procedure visit Milinda Pointer, MD [facility]  Showing today's visits and meeting all other requirements Future Appointments Date Type Provider Dept  04/29/20 Appointment Milinda Pointer, MD [facility]  Showing future appointments within next 90 days and meeting all other requirements  Disposition: Discharge home  Discharge (Date  Time): 03/19/2020; 1010 hrs.   Primary Care Physician: Lavera Guise, MD Location: [facility] Note by: Gaspar Cola, MD Date: 03/19/2020; Time: 10:47 AM  Disclaimer:  Medicine is not an Chief Strategy Officer. The only guarantee in medicine is that nothing is guaranteed. It is important to note that the decision to proceed with this intervention was based on the information collected from the patient. The Data and conclusions were drawn from the patient's questionnaire, the interview, and the physical examination. Because the information was provided in large part by the patient, it cannot be guaranteed that it has not been purposely or unconsciously manipulated. Every effort has been made to obtain as much relevant data as possible for this evaluation. It is important to note that the conclusions that lead to this procedure are derived in large part from the available data. Always take into account that the treatment will also be dependent on  availability of resources and existing treatment guidelines, considered by other Pain Management Practitioners as being common knowledge and practice, at the time of the intervention. For Medico-Legal purposes, it is also important to point out that variation in procedural techniques and pharmacological choices are the acceptable norm. The indications, contraindications, technique, and results of the above procedure should only be interpreted and judged by a Board-Certified Interventional Pain Specialist with extensive familiarity and expertise in the same exact procedure and technique.

## 2020-03-19 ENCOUNTER — Ambulatory Visit
Admission: RE | Admit: 2020-03-19 | Discharge: 2020-03-19 | Disposition: A | Discharge status: Home / Self Care | Payer: Medicare HMO | Source: Ambulatory Visit | Attending: Pain Medicine | Admitting: Pain Medicine

## 2020-03-19 ENCOUNTER — Encounter: Payer: Self-pay | Admitting: Pain Medicine

## 2020-03-19 ENCOUNTER — Ambulatory Visit (HOSPITAL_BASED_OUTPATIENT_CLINIC_OR_DEPARTMENT_OTHER): Payer: Medicare HMO | Admitting: Pain Medicine

## 2020-03-19 ENCOUNTER — Other Ambulatory Visit: Payer: Self-pay

## 2020-03-19 VITALS — BP 156/69 | HR 60 | Temp 97.5°F | Resp 12 | Ht 64.0 in | Wt 152.0 lb

## 2020-03-19 DIAGNOSIS — M150 Primary generalized (osteo)arthritis: Secondary | ICD-10-CM

## 2020-03-19 DIAGNOSIS — M25551 Pain in right hip: Secondary | ICD-10-CM

## 2020-03-19 DIAGNOSIS — M8949 Other hypertrophic osteoarthropathy, multiple sites: Secondary | ICD-10-CM | POA: Diagnosis present

## 2020-03-19 DIAGNOSIS — M160 Bilateral primary osteoarthritis of hip: Secondary | ICD-10-CM

## 2020-03-19 DIAGNOSIS — G8929 Other chronic pain: Secondary | ICD-10-CM

## 2020-03-19 DIAGNOSIS — G894 Chronic pain syndrome: Secondary | ICD-10-CM

## 2020-03-19 DIAGNOSIS — M159 Polyosteoarthritis, unspecified: Secondary | ICD-10-CM

## 2020-03-19 DIAGNOSIS — M25552 Pain in left hip: Secondary | ICD-10-CM

## 2020-03-19 MED ORDER — METHYLPREDNISOLONE ACETATE 80 MG/ML IJ SUSP
80.0000 mg | Freq: Once | INTRAMUSCULAR | Status: AC
  Administered 2020-03-19: 80 mg via INTRA_ARTICULAR

## 2020-03-19 MED ORDER — METHYLPREDNISOLONE ACETATE 80 MG/ML IJ SUSP
INTRAMUSCULAR | Status: AC
  Filled 2020-03-19: qty 1

## 2020-03-19 MED ORDER — FENTANYL 12 MCG/HR TD PT72: 1.0000 | MEDICATED_PATCH | TRANSDERMAL | 0 refills | Status: DC

## 2020-03-19 MED ORDER — IOHEXOL 180 MG/ML  SOLN
INTRAMUSCULAR | Status: AC
  Filled 2020-03-19: qty 20

## 2020-03-19 MED ORDER — ROPIVACAINE HCL 2 MG/ML IJ SOLN
INTRAMUSCULAR | Status: AC
  Filled 2020-03-19: qty 10

## 2020-03-19 MED ORDER — LIDOCAINE HCL 2 % IJ SOLN
20.0000 mL | Freq: Once | INTRAMUSCULAR | Status: AC
  Administered 2020-03-19: 400 mg

## 2020-03-19 MED ORDER — ROPIVACAINE HCL 2 MG/ML IJ SOLN
9.0000 mL | Freq: Once | INTRAMUSCULAR | Status: AC
  Administered 2020-03-19: 9 mL via INTRA_ARTICULAR

## 2020-03-19 MED ORDER — LIDOCAINE HCL 2 % IJ SOLN
INTRAMUSCULAR | Status: AC
  Filled 2020-03-19: qty 10

## 2020-03-19 MED ORDER — IOHEXOL 180 MG/ML  SOLN
10.0000 mL | Freq: Once | INTRAMUSCULAR | Status: AC
  Administered 2020-03-19: 10 mL via INTRA_ARTICULAR

## 2020-03-20 ENCOUNTER — Telehealth: Payer: Self-pay

## 2020-03-20 DIAGNOSIS — E875 Hyperkalemia: Secondary | ICD-10-CM | POA: Insufficient documentation

## 2020-03-20 DIAGNOSIS — Z0001 Encounter for general adult medical examination with abnormal findings: Secondary | ICD-10-CM | POA: Insufficient documentation

## 2020-03-20 DIAGNOSIS — N289 Disorder of kidney and ureter, unspecified: Secondary | ICD-10-CM | POA: Insufficient documentation

## 2020-03-20 NOTE — Telephone Encounter (Signed)
Ost procedure phone call.  Patient states she is doing fine.

## 2020-03-29 ENCOUNTER — Other Ambulatory Visit: Payer: Self-pay

## 2020-03-29 ENCOUNTER — Other Ambulatory Visit: Payer: Self-pay | Admitting: Psychiatry

## 2020-03-29 DIAGNOSIS — F411 Generalized anxiety disorder: Secondary | ICD-10-CM

## 2020-03-29 DIAGNOSIS — F5105 Insomnia due to other mental disorder: Secondary | ICD-10-CM

## 2020-03-29 DIAGNOSIS — F3342 Major depressive disorder, recurrent, in full remission: Secondary | ICD-10-CM

## 2020-03-29 MED ORDER — OXYBUTYNIN CHLORIDE ER 10 MG PO TB24
10.0000 mg | ORAL_TABLET | Freq: Every day | ORAL | 2 refills | Status: DC
Start: 2020-03-29 — End: 2020-06-28

## 2020-03-29 MED ORDER — FLUTICASONE PROPIONATE 50 MCG/ACT NA SUSP: NASAL | 1 refills | Status: DC

## 2020-04-17 ENCOUNTER — Encounter: Payer: Self-pay | Admitting: Psychiatry

## 2020-04-17 ENCOUNTER — Telehealth (INDEPENDENT_AMBULATORY_CARE_PROVIDER_SITE_OTHER): Payer: Medicare HMO | Admitting: Psychiatry

## 2020-04-17 ENCOUNTER — Other Ambulatory Visit: Payer: Self-pay

## 2020-04-17 DIAGNOSIS — R69 Illness, unspecified: Secondary | ICD-10-CM | POA: Diagnosis not present

## 2020-04-17 DIAGNOSIS — F22 Delusional disorders: Secondary | ICD-10-CM | POA: Diagnosis not present

## 2020-04-17 DIAGNOSIS — F411 Generalized anxiety disorder: Secondary | ICD-10-CM

## 2020-04-17 DIAGNOSIS — F3342 Major depressive disorder, recurrent, in full remission: Secondary | ICD-10-CM | POA: Diagnosis not present

## 2020-04-17 DIAGNOSIS — F5105 Insomnia due to other mental disorder: Secondary | ICD-10-CM

## 2020-04-17 MED ORDER — HYDROXYZINE HCL 10 MG PO TABS: 10.0000 mg | ORAL_TABLET | Freq: Three times a day (TID) | ORAL | 1 refills | Status: DC | PRN

## 2020-04-17 MED ORDER — TRAZODONE HCL 50 MG PO TABS: 25.0000 mg | ORAL_TABLET | Freq: Every day | ORAL | 1 refills | Status: DC

## 2020-04-17 NOTE — Progress Notes (Signed)
Provider Location : [facility] Patient Location : Home  Participants: Patient , Provider  Virtual Visit via Telephone Note  I connected with Kristen Pennington on 04/17/20 at 10:20 AM EDT by telephone and verified that I am speaking with the correct person using two identifiers.   I discussed the limitations, risks, security and privacy concerns of performing an evaluation and management service by telephone and the availability of in person appointments. I also discussed with the patient that there may be a patient responsible charge related to this service. The patient expressed understanding and agreed to proceed.      I discussed the assessment and treatment plan with the patient. The patient was provided an opportunity to ask questions and all were answered. The patient agreed with the plan and demonstrated an understanding of the instructions.   The patient was advised to call back or seek an in-person evaluation if the symptoms worsen or if the condition fails to improve as anticipated.  [facility] MD OP Progress Note  04/17/2020 5:44 PM Kristen Pennington  MRN:  [id number]  Chief Complaint:  Chief Complaint    Follow-up     HPI: Kristen Pennington is a 86 year old Caucasian female, widowed, lives in [place], has a history of GAD, MDD, delusional disorder, insomnia, hearing loss, vision loss, OSA, hypertension was evaluated by phone today.  Patient today reports she has a lot of problems going on at her housing.  She reports she is trying to get it under control however nothing has changed so far and that is frustrating for her.  She reports she does struggle with sleep.  She also has anxiety symptoms on a regular basis due to everything that is going on.  She reports her pain is more under control now and that does not worry her anymore.  She did not appear to be preoccupied with any delusions or paranoia today.  She denies any suicidality, homicidality or perceptual  disturbances.    Visit Diagnosis:    ICD-10-CM   1. Generalized anxiety disorder  F41.1   2. MDD (major depressive disorder), recurrent, in full remission ([place])  F33.42 traZODone (DESYREL) 50 MG tablet  3. Delusional disorder ([place])  F22   4. Insomnia due to mental condition  F51.05 traZODone (DESYREL) 50 MG tablet    hydrOXYzine (ATARAX/VISTARIL) 10 MG tablet    Past Psychiatric History: I have reviewed past psychiatric history from my progress note on 07/25/2018.  Past trials of Klonopin, Abilify, Lexapro, mirtazapine  Past Medical History:  Past Medical History:  Diagnosis Date  . Arthritis   . Atrophic vaginitis 03/06/2013   Last Assessment & Plan:  She will plan to continue estradiol vaginal cream.  Prescription was rewritten stipulating use of the generic product.   . Atypical migraine 03/06/2013   Last Assessment & Plan:  Since she rarely takes sumatriptan, we will discontinue it. Continue sparing use of OTC migraine products.   . Avitaminosis D 03/06/2013   Last Assessment & Plan:  Recheck vitamin D level. Plan to continue vitamin D supplementation.   . Ceratitis 08/01/2013   Last Assessment & Plan:  Because of the expense, she will discontinue Restasis. She will use over-the-counter moisturizing eyedrops and liquid gel.   . Colon polyp   . 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   . 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   . Cystocele, midline 03/06/2013  . Demoralization and apathy 03/06/2013   Last Assessment & Plan:  Since she has been taking bupropion only once a day, I will write the prescription with the correct instructions and correct quantity. She has done well on this for years and she is encouraged to take it regularly   . Depression   . Environmental allergies   . Epistaxis   .  Essential (primary) hypertension 05/17/2013   Last Assessment & Plan:  Her blood pressure is well-controlled. We will plan to continue hydrochlorothiazide and benazepril.  Both are generic and should be affordable on her health plan.   Marland Kitchen GERD (gastroesophageal reflux disease)   . Hemorrhoid   . Inflammation of sacroiliac joint ([place]) 03/06/2013   Last Assessment & Plan:  She is doing well on her present regimen of fentanyl patch supplemented with sparing use of tramadol/acetaminophen. We will continue that regimen.   . Sinusitis   . Sleep apnea    CPAP    Past Surgical History:  Procedure Laterality Date  . COLONOSCOPY  05-03-15  . COLONOSCOPY WITH PROPOFOL N/A 05/03/2015   Procedure: COLONOSCOPY WITH PROPOFOL;  Surgeon: Lucilla Lame, MD;  Location: [facility];  Service: Endoscopy;  Laterality: N/A;  CPAP  . DILATION AND CURETTAGE OF UTERUS  1970  . ENDOMETRIAL BIOPSY    . EYE SURGERY Right 2017  . POLYPECTOMY  05/03/2015   Procedure: POLYPECTOMY;  Surgeon: Lucilla Lame, MD;  Location: [facility];  Service: Endoscopy;;  . ROTATOR CUFF REPAIR  2007  . SPINE SURGERY      Family Psychiatric History: I have reviewed family psychiatric history from my progress note on 07/25/2018  Family History:  Family History  Problem Relation Age of Onset  . Arthritis Mother   . Alzheimer's disease Father   . Heart disease Sister   . Cancer Brother   . Ovarian cancer Neg Hx   . Breast cancer Neg Hx   . Colon cancer Neg Hx   . Diabetes Neg Hx     Social History: I have reviewed social history from my progress note on 07/25/2018 Social History   Socioeconomic History  . Marital status: Widowed    Spouse name: Not on file  . Number of children: 2  . Years of education: Not on file  . Highest education level: Some college, no degree  Occupational History  . Not on file  Tobacco Use  . Smoking status: Never Smoker  . Smokeless tobacco: Never Used  Vaping Use  . Vaping Use:  Never used  Substance and Sexual Activity  . Alcohol use: No    Alcohol/week: 0.0 standard drinks  . Drug use: No  . Sexual activity: Not Currently  Other Topics Concern  . Not on file  Social History Narrative  . Not on file   Social Determinants of Health   Financial Resource Strain:   . Difficulty of Paying Living Expenses: Not on file  Food Insecurity:   . Worried About Charity fundraiser in the Last Year: Not on file  . Ran Out of Food in the Last Year: Not on file  Transportation Needs:   . Lack of Transportation (Medical): Not on file  . Lack of Transportation (Non-Medical): Not on file  Physical Activity:   . Days of Exercise per Week: Not on file  . Minutes of Exercise per Session: Not on file  Stress:   .  Feeling of Stress : Not on file  Social Connections:   . Frequency of Communication with Friends and Family: Not on file  . Frequency of Social Gatherings with Friends and Family: Not on file  . Attends Religious Services: Not on file  . Active Member of Clubs or Organizations: Not on file  . Attends Archivist Meetings: Not on file  . Marital Status: Not on file    Allergies: No Known Allergies  Metabolic Disorder Labs: Lab Results  Component Value Date   HGBA1C 5.4 08/01/2018   Lab Results  Component Value Date   PROLACTIN 8.1 08/01/2018   Lab Results  Component Value Date   CHOL 245 (H) 08/01/2018   TRIG 187 (H) 08/01/2018   HDL 49 08/01/2018   LDLCALC 159 (H) 08/01/2018   Lab Results  Component Value Date   TSH 2.830 08/10/2018   TSH 5.550 (H) 08/01/2018    Therapeutic Level Labs: No results found for: LITHIUM No results found for: VALPROATE No components found for:  CBMZ  Current Medications: Current Outpatient Medications  Medication Sig Dispense Refill  . acetaminophen (TYLENOL) 500 MG tablet Take 500 mg by mouth every 6 (six) hours as needed.    . bisoprolol-hydrochlorothiazide (ZIAC) 5-6.25 MG tablet Take 1 tablet by  mouth daily. 30 tablet 3  . Carboxymethylcellulose Sodium (THERATEARS OP) Apply to eye.    . Cholecalciferol (VITAMIN D3) 5000 units TABS Take 5,000 Units by mouth daily.    Marland Kitchen dicyclomine (BENTYL) 10 MG capsule Take 10 mg by mouth 4 (four) times daily -  before meals and at bedtime.    Marland Kitchen escitalopram (LEXAPRO) 20 MG tablet Take 1 tablet (20 mg total) by mouth daily. 90 tablet 0  . estradiol (ESTRACE) 0.1 MG/GM vaginal cream Use once a week as needed 42.5 g 1  . fentaNYL (DURAGESIC) 12 MCG/HR Place 1 patch onto the skin every 3 (three) days. Must last 30 days. 10 patch 0  . [START ON 04/18/2020] fentaNYL (DURAGESIC) 12 MCG/HR Place 1 patch onto the skin every 3 (three) days. Must last 30 days. 10 patch 0  . fluticasone (FLONASE) 50 MCG/ACT nasal spray 1 SPRAY IN EACH NOSTRIL ONCE ADAY AS NEEDED 16 g 1  . hydrOXYzine (ATARAX/VISTARIL) 10 MG tablet Take 1 tablet (10 mg total) by mouth 3 (three) times daily as needed for anxiety. For severe anxiety attacks only 90 tablet 1  . Liniments (SALONPAS PAIN RELIEF PATCH EX) Apply 1 patch topically 2 (two) times daily.    Marland Kitchen lubiprostone (AMITIZA) 8 MCG capsule Take 1 capsule po bid prn ibs constipation. 60 capsule 3  . meloxicam (MOBIC) 7.5 MG tablet Take 1 tablet (7.5 mg total) by mouth daily. 30 tablet 5  . Multiple Vitamins-Minerals (CENTRUM SILVER 50+WOMEN) TABS Take by mouth.    . oxybutynin (DITROPAN-XL) 10 MG 24 hr tablet Take 1 tablet (10 mg total) by mouth daily. 30 tablet 2  . pantoprazole (PROTONIX) 40 MG tablet Take 1 tablet (40 mg total) by mouth daily. 90 tablet 1  . Polyethyl Glycol-Propyl Glycol (SYSTANE) 0.4-0.3 % SOLN Apply to eye.    . promethazine (PHENERGAN) 25 MG tablet Take 25 mg by mouth as needed.     Marland Kitchen rOPINIRole (REQUIP) 1 MG tablet TAKE ONE TABLET 3 TIMES DAILY AS NEEDED FOR RESTLESS LEGS 90 tablet 3  . simethicone (MYLICON) 80 MG chewable tablet Chew 80 mg by mouth every 6 (six) hours as needed for flatulence.    Marland Kitchen  traZODone  (DESYREL) 50 MG tablet Take 0.5-1 tablets (25-50 mg total) by mouth at bedtime. 30 tablet 1  . XIIDRA 5 % SOLN Apply to eye. Sample given by eye dr     No current facility-administered medications for this visit.     Musculoskeletal: Strength & Muscle Tone: UTA Gait & Station: UTA Patient leans: N/A  Psychiatric Specialty Exam: Review of Systems  Psychiatric/Behavioral: The patient is nervous/anxious.   All other systems reviewed and are negative.   There were no vitals taken for this visit.There is no height or weight on file to calculate BMI.  General Appearance: UTA  Eye Contact:  UTA  Speech:  Normal Rate  Volume:  Normal  Mood:  Anxious  Affect:  UTA  Thought Process:  Goal Directed and Descriptions of Associations: Intact  Orientation:  Full (Time, Place, and Person)  Thought Content: Logical   Suicidal Thoughts:  No  Homicidal Thoughts:  No  Memory:  Immediate;   Fair Recent;   Fair Remote;   Fair  Judgement:  Fair  Insight:  Fair  Psychomotor Activity:  UTA  Concentration:  Concentration: Fair and Attention Span: Fair  Recall:  [employer] of Knowledge: Fair  Language: Fair  Akathisia:  No  Handed:  Right  AIMS (if indicated):UTA  Assets:  Communication Skills Desire for Improvement Housing Social Support  ADL's:  Intact  Cognition: WNL  Sleep:  Fair   Screenings: Mini-Mental     Clinical Support from 03/07/2020 in [facility], [place] from 03/06/2019 in [facility], [place] from 03/02/2018 in [facility], [facility]  Total Score (max 30 points ) 30 30 27     PHQ2-9     Clinical Support from 03/07/2020 in [facility], [facility] Office Visit from 02/14/2020 in [facility] Office Visit from 01/19/2020 in [facility], [place] from 03/06/2019 in [facility], [facility] Office Visit from 11/08/2018 in [facility], [facility]   PHQ-2 Total Score 3 0 0 0 0       Assessment and Plan: KELLEIGH SKERRITT is a 86 year old female who has a history of GAD, MDD, history of psychosis, essential hypertension, chronic pain, OSA, hearing loss, bereavement was evaluated by phone today.  Patient is currently struggling with anxiety as well as sleep problems.  Plan as noted below.  Plan GAD-unstable Continue Lexapro 20 mg p.o. daily Discussed referral for CBT-she declines. Her anxiety symptoms are currently situational. Start hydroxyzine 10 mg p.o. 3 times daily as needed for severe anxiety symptoms  Insomnia-unstable Discontinue mirtazapine. Start Trazodone 25 to 50 mg p.o. nightly as needed Continue CPAP.  Delusional disorder in remission Will monitor closely.  Follow-up in clinic in 3 to 4 weeks or sooner if needed.  I have spent atleast 20 minutes non face to face with patient today. More than 50 % of the time was spent for preparing to see the patient ( e.g., review of test, records ), ordering medications and test ,psychoeducation and supportive psychotherapy and care coordination,as well as documenting clinical information in electronic health record. This note was generated in part or whole with voice recognition software. Voice recognition is usually quite accurate but there are transcription errors that can and very often do occur. I apologize for any typographical errors that were not detected and corrected.        Ursula Alert, MD 04/17/2020, 5:44 PM

## 2020-04-17 NOTE — Patient Instructions (Signed)
Stop Remeron. Start Trazodone 25-50 mg at bedtime for sleep. Start Vistaril 10 mg three times a day as needed for anxiety.

## 2020-04-27 NOTE — Progress Notes (Signed)
The patient called and reschedule her appointment to next Monday.

## 2020-04-29 ENCOUNTER — Encounter: Payer: Medicare HMO | Admitting: Pain Medicine

## 2020-04-29 DIAGNOSIS — M961 Postlaminectomy syndrome, not elsewhere classified: Secondary | ICD-10-CM

## 2020-04-29 DIAGNOSIS — Z79899 Other long term (current) drug therapy: Secondary | ICD-10-CM

## 2020-04-29 DIAGNOSIS — G894 Chronic pain syndrome: Secondary | ICD-10-CM

## 2020-04-29 DIAGNOSIS — G8929 Other chronic pain: Secondary | ICD-10-CM

## 2020-04-29 DIAGNOSIS — Z79891 Long term (current) use of opiate analgesic: Secondary | ICD-10-CM | POA: Insufficient documentation

## 2020-04-29 NOTE — Progress Notes (Signed)
PROVIDER NOTE: Information contained herein reflects review and annotations entered in association with encounter. Interpretation of such information and data should be left to medically-trained personnel. Information provided to patient can be located elsewhere in the medical record under "Patient Instructions". Document created using STT-dictation technology, any transcriptional errors that may result from process are unintentional.    Patient: Kristen Pennington  Service Category: E/M  Provider: Gaspar Cola, MD  DOB: 1929/06/09  DOS: 05/06/2020  Specialty: Interventional Pain Management  MRN: [id number]  Setting: Ambulatory outpatient  PCP: Lavera Guise, MD  Type: Established Patient    Referring Provider: Lavera Guise, MD  Location: Office  Delivery: Face-to-face     HPI  Ms. MAUDEAN HOFFMANN, a 86 y.o. year old female, is here today because of her Chronic pain syndrome [G89.4]. Ms. Lamoreaux's primary complain today is Hip Pain (bilateral) and Back Pain (low) Last encounter: My last encounter with her was on 04/29/2020. Pertinent problems: Ms. 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 Pain Assessment: Severity of Chronic pain is reported as a 5 /10. Location: Hip (and low back) Right, Left/ . Onset: More than a month ago. Quality:  Aching, Constant. Timing: Constant. Modifying factor(s): medications,rest, heat. Vitals:  height is $RemoveB'5\' 4"'GmePMMep$  (1.626 m) and weight is 150 lb (68 kg). Her temporal temperature is 97.2 F (36.2 C) (abnormal). Her blood pressure is 119/97 (abnormal) and her pulse is 56 (abnormal). Her respiration is 18 and oxygen saturation is 97%.   Reason for encounter: both, medication management and post-procedure assessment.  The patient returns to the clinic today indicating 100% relief of the pain from the bilateral intra-articular hip joint injection.  She has been able to do a lot more and she is extremely happy.  Currently she is not using the fentanyl patch and in fact, the family thought that some of the medications are causing her to be disoriented and therefore they have stopped most of her medicines.  She actually is well oriented today with no evidence of oversedation and she also does not look in any kind of distress.  At this point I do not believe that she needs the fentanyl patch anymore.  However, she has been more active and because of that she has began to experience some pain with prolonged standing that seems to be in the dead center of the lower back.  Review of her x-rays show that she has her interspinous processes with some evidence of sclerosis, suggesting a kissing spine syndrome.  Physical exam today demonstrated the patient to have some reproduction of the pain upon hyperextension of the lumbar spine, which was also observed to have decreased range of motion.  The plan is to bring the patient back and do a diagnostic interspinous injection with some localized that I can steroid at the level where she is experiencing the pain.  If this provides her with good relief of the pain for the duration of the local anesthetic, but no long-term benefit from the steroids, then  we will consider radiofrequency ablation.  The patient was made aware of the plan which she understood and accepted.  RTCB:  06/17/2020      Post-Procedure Evaluation  Procedure (03/19/2020): Diagnostic bilateral intra-articular hip joint injection #1 under fluoroscopic guidance, no sedation Pre-procedure pain level: 4/10 Post-procedure: 0/10 (100% relief)  Sedation: None.  Effectiveness during initial hour after procedure(Ultra-Short Term Relief): 100 %.  Local anesthetic used: Long-acting (4-6 hours) Effectiveness: Defined as any analgesic benefit obtained secondary to the administration of local anesthetics. This carries significant diagnostic value as to the etiological location, or anatomical origin, of the pain. Duration of benefit is expected to coincide with the duration of the local anesthetic used.  Effectiveness during initial 4-6 hours after procedure(Short-Term Relief): 100 %.  Long-term benefit: Defined as any relief past the pharmacologic duration of the local anesthetics.  Effectiveness past the initial 6 hours after procedure(Long-Term Relief): 90 %.  (She still has 100% relief of both hips, but the pain that remains is in the center of the lower back in a very small and specific area)  Current benefits: Defined as benefit that persist at this time.   Analgesia:  90-100% better Function: Ms. Eckersley reports improvement in function ROM: Ms. Kintzel reports improvement in ROM  Pharmacotherapy Assessment   Analgesic: No opioid analgesics prescribed by our practice.  She used to get a fentanyl 12.5 mcg/h patch from [place], but we discontinued it due to improvement of pain and cognitive impairment secondary to the medication. Highest recorded MME/day: 28.8 mg/day MME/day: 0 mg/day   Monitoring:  PMP: PDMP reviewed during this encounter.       Pharmacotherapy: No side-effects or adverse reactions reported. Compliance: No problems identified. Effectiveness: Clinically acceptable.  Hart Rochester, RN  05/06/2020  8:13 AM  Sign when Signing Visit Safety precautions to be maintained  throughout the outpatient stay will include: orient to surroundings, keep bed in low position, maintain call bell within reach at all times, provide assistance with transfer out of bed and ambulation.     UDS:  Summary  Date Value Ref Range Status  02/14/2020 Note  Final    Comment:    ==================================================================== Compliance Drug Analysis, Ur ==================================================================== Test                             Result       Flag       Units  Drug Present and Declared for Prescription Verification   Fentanyl                       6            EXPECTED   ng/mg creat   Norfentanyl                    32           EXPECTED   ng/mg creat    Source of fentanyl is a scheduled prescription medication, including    IV, patch, and transmucosal formulations. Norfentanyl is an expected    metabolite of fentanyl.    Citalopram                     PRESENT      EXPECTED   Desmethylcitalopram            PRESENT      EXPECTED    Desmethylcitalopram is an expected  metabolite of citalopram or the    enantiomeric form, escitalopram.    Mirtazapine                    PRESENT      EXPECTED   Acetaminophen                  PRESENT      EXPECTED  Drug Absent but Declared for Prescription Verification   Promethazine                   Not Detected UNEXPECTED ==================================================================== Test                      Result    Flag   Units      Ref Range   Creatinine              199              mg/dL      >=20 ==================================================================== Declared Medications:  The flagging and interpretation on this report are based on the  following declared medications.  Unexpected results may arise from  inaccuracies in the declared medications.   **Note: The testing scope of this panel includes these medications:   Escitalopram (Lexapro)  Fentanyl (Duragesic)   Mirtazapine (Remeron)  Promethazine (Phenergan)   **Note: The testing scope of this panel does not include small to  moderate amounts of these reported medications:   Acetaminophen (Tylenol)   **Note: The testing scope of this panel does not include the  following reported medications:   Bisoprolol  Dicyclomine (Bentyl)  Estradiol (Estrace)  Eye Drop  Fluticasone (Flonase)  Hydrochlorothiazide  Lubiprostone (Amitiza)  Meloxicam (Mobic)  Multivitamin (Centrum)  Oxybutynin (Ditropan)  Pantoprazole (Protonix)  Ropinirole (Requip)  Simethicone  Topical  Vitamin D3 ==================================================================== For clinical consultation, please call [phone]. ====================================================================      ROS  Constitutional: Denies any fever or chills Gastrointestinal: No reported hemesis, hematochezia, vomiting, or acute GI distress Musculoskeletal: Denies any acute onset joint swelling, redness, loss of ROM, or weakness Neurological: No reported episodes of acute onset apraxia, aphasia, dysarthria, agnosia, amnesia, paralysis, loss of coordination, or loss of consciousness  Medication Review  Carboxymethylcellulose Sodium, Centrum Silver 50+Women, Lifitegrast, Menthol-Methyl Salicylate, Polyethyl Glycol-Propyl Glycol, Vitamin D3, acetaminophen, bisoprolol-hydrochlorothiazide, dicyclomine, escitalopram, estradiol, fluticasone, hydrOXYzine, lubiprostone, meloxicam, oxybutynin, pantoprazole, promethazine, rOPINIRole, simethicone, and traZODone  History Review  Allergy: Ms. Godsil has No Known Allergies. Drug: Ms. Hendrie  reports no history of drug use. Alcohol:  reports no history of alcohol use. Tobacco:  reports that she has never smoked. She has never used smokeless tobacco. Social: Ms. Onken  reports that she has never smoked. She has never used smokeless tobacco. She reports that she does not drink  alcohol and does not use drugs. 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 Surgical: Ms. Johanning  has a past surgical history that includes Spine surgery; Rotator cuff repair (2007); Dilation and curettage of uterus (1970); Colonoscopy with propofol (N/A, 05/03/2015); polypectomy (05/03/2015); Colonoscopy (05-03-15); Eye surgery (Right, 2017); and Endometrial biopsy. Family: family history includes Alzheimer's disease in her father; Arthritis in  her mother; Cancer in her brother; Heart disease in her sister.  Laboratory Chemistry Profile   Renal Lab Results  Component Value Date   BUN 19 02/14/2020   CREATININE 1.12 (H) 02/14/2020   BCR 17 02/14/2020   GFRAA 50 (L) 02/14/2020   GFRNONAA 44 (L) 02/14/2020     Hepatic Lab Results  Component Value Date   AST 29 02/14/2020   ALBUMIN 4.9 (H) 02/14/2020   ALKPHOS 76 02/14/2020     Electrolytes Lab Results  Component Value Date   NA 144 02/14/2020   K 5.3 (H) 02/14/2020   CL 103 02/14/2020   CALCIUM 10.4 (H) 02/14/2020   MG 2.3 02/14/2020     Bone Lab Results  Component Value Date   VD25OH 57.1 08/10/2018   25OHVITD1 84 02/14/2020   25OHVITD2 <1.0 02/14/2020   25OHVITD3 84 02/14/2020     Inflammation (CRP: Acute Phase) (ESR: Chronic Phase) Lab Results  Component Value Date   CRP <0.5 10/26/2019   ESRSEDRATE 2 10/26/2019       Note: Above Lab results reviewed.  Recent Imaging  Review  DG PAIN CLINIC C-ARM 1-60 MIN NO REPORT Fluoro was used, but no Radiologist interpretation will be provided.  Please refer to "NOTES" tab for provider progress note. Note: Reviewed        Physical Exam  General appearance: Well nourished, well developed, and well hydrated. In no apparent acute distress Mental status: Alert, oriented x 3 (person, place, & time)       Respiratory: No evidence of acute respiratory distress Eyes: PERLA Vitals: BP (!) 119/97    Pulse (!) 56    Temp (!) 97.2 F (36.2 C) (Temporal)    Resp 18    Ht $R'5\' 4"'XX$  (1.626 m)    Wt 150 lb (68 kg)    SpO2 97%    BMI 25.75 kg/m  BMI: Estimated body mass index is 25.75 kg/m as calculated from the following:   Height as of this encounter: $RemoveBeforeD'5\' 4"'cqnsyogsnXMbxS$  (1.626 m).   Weight as of this encounter: 150 lb (68 kg). Ideal: Ideal body weight: 54.7 kg (120 lb 9.5 oz) Adjusted ideal body weight: 60 kg (132 lb 5.7 oz)  Assessment   Status Diagnosis  Controlled Resolved Persistent 1. Chronic pain syndrome   2. Chronic hip pain (2ry area of Pain) (Bilateral) (R>L)   3. Chronic low back pain (1ry area of Pain) (Bilateral) (R>L) w/o sciatica   4. Kissing spine syndrome      Updated Problems: Problem  Kissing Spine Syndrome   Level: TBD     Plan of Care  Problem-specific:  No problem-specific Assessment & Plan notes found for this encounter.  Ms. EULLA KOCHANOWSKI has a current medication list which includes the following long-term medication(s): bisoprolol-hydrochlorothiazide, dicyclomine, escitalopram, fluticasone, pantoprazole, ropinirole, and trazodone.  Pharmacotherapy (Medications Ordered): No orders of the defined types were placed in this encounter.  Orders:  Orders Placed This Encounter  Procedures   Injection tendon or ligament    Standing Status:   Future    Standing Expiration Date:   08/06/2020    Scheduling Instructions:     Type of Block:  Interspinous process ligament injection     Side: Midline      Sedation: No Sedation.     Timeframe: ASAA   Follow-up plan:   Return for Procedure (no sedation): (ML) Interspinous process inj. #1.      Interventional management options: Planned, scheduled, and/or pending:  Diagnostic midline interspinous process injection #1 (for diagnosis of kissing spine syndrome)   Considering:   Diagnostic bilateral lumbar facet block  Diagnostic bilateral SI joint block  Possible diagnostic bilateral femoral nerve and obturator nerve block  Diagnostic midline L3-4 LESI  Diagnostic midline caudal ESI    PRN Procedures:   Palliative bilateral IA hip joint injection #2 (100/100/90/100)    Recent Visits Date Type Provider Dept  03/19/20 Procedure visit Milinda Pointer, MD [facility]  02/14/20 Office Visit Milinda Pointer, MD [facility]  Showing recent visits within past 90 days and meeting all other requirements Today's Visits Date Type Provider Dept  05/06/20 Office Visit Milinda Pointer, MD [facility]  Showing today's visits and meeting all other requirements Future Appointments No visits were found meeting these conditions. Showing future appointments within next 90 days and meeting all other requirements  I discussed the assessment and treatment plan with the patient. The patient was provided an opportunity to ask questions and all were answered. The patient agreed with the plan and demonstrated an understanding of the instructions.  Patient advised to call back or seek an in-person evaluation if the symptoms or condition worsens.  Duration of encounter: 30 minutes.  Note by: Gaspar Cola, MD Date: 05/06/2020; Time: 8:50 AM

## 2020-05-03 ENCOUNTER — Other Ambulatory Visit: Payer: Self-pay | Admitting: Psychiatry

## 2020-05-03 ENCOUNTER — Other Ambulatory Visit: Payer: Self-pay

## 2020-05-03 DIAGNOSIS — F3342 Major depressive disorder, recurrent, in full remission: Secondary | ICD-10-CM

## 2020-05-03 DIAGNOSIS — F5105 Insomnia due to other mental disorder: Secondary | ICD-10-CM

## 2020-05-03 DIAGNOSIS — I10 Essential (primary) hypertension: Secondary | ICD-10-CM

## 2020-05-03 MED ORDER — BISOPROLOL-HYDROCHLOROTHIAZIDE 5-6.25 MG PO TABS: 1.0000 | ORAL_TABLET | Freq: Every day | ORAL | 3 refills | Status: DC

## 2020-05-03 MED ORDER — ROPINIROLE HCL 1 MG PO TABS
ORAL_TABLET | ORAL | 3 refills | Status: DC
Start: 2020-05-03 — End: 2020-08-01

## 2020-05-06 ENCOUNTER — Ambulatory Visit: Payer: Medicare HMO | Attending: Pain Medicine | Admitting: Pain Medicine

## 2020-05-06 ENCOUNTER — Other Ambulatory Visit: Payer: Self-pay

## 2020-05-06 ENCOUNTER — Encounter: Payer: Self-pay | Admitting: Pain Medicine

## 2020-05-06 VITALS — BP 119/97 | HR 56 | Temp 97.2°F | Resp 18 | Ht 64.0 in | Wt 150.0 lb

## 2020-05-06 DIAGNOSIS — M25551 Pain in right hip: Secondary | ICD-10-CM | POA: Diagnosis not present

## 2020-05-06 DIAGNOSIS — G894 Chronic pain syndrome: Secondary | ICD-10-CM

## 2020-05-06 DIAGNOSIS — G8929 Other chronic pain: Secondary | ICD-10-CM

## 2020-05-06 DIAGNOSIS — M545 Low back pain, unspecified: Secondary | ICD-10-CM

## 2020-05-06 DIAGNOSIS — M4820 Kissing spine, site unspecified: Secondary | ICD-10-CM | POA: Diagnosis not present

## 2020-05-06 DIAGNOSIS — M25552 Pain in left hip: Secondary | ICD-10-CM

## 2020-05-06 NOTE — Patient Instructions (Addendum)
____________________________________________________________________________________________  Preparing for your procedure (without sedation)  Procedure appointments are limited to planned procedures: . No Prescription Refills. . No disability issues will be discussed. . No medication changes will be discussed.  Instructions: . Oral Intake: Do not eat or drink anything for at least 6 hours prior to your procedure. (Exception: Blood Pressure Medication. See below.) . Transportation: Unless otherwise stated by your physician, you may drive yourself after the procedure. . Blood Pressure Medicine: Do not forget to take your blood pressure medicine with a sip of water the morning of the procedure. If your Diastolic (lower reading)is above 100 mmHg, elective cases will be cancelled/rescheduled. . Blood thinners: These will need to be stopped for procedures. Notify our staff if you are taking any blood thinners. Depending on which one you take, there will be specific instructions on how and when to stop it. . Diabetics on insulin: Notify the staff so that you can be scheduled 1st case in the morning. If your diabetes requires high dose insulin, take only  of your normal insulin dose the morning of the procedure and notify the staff that you have done so. . Preventing infections: Shower with an antibacterial soap the morning of your procedure.  . Build-up your immune system: Take 1000 mg of Vitamin C with every meal (3 times a day) the day prior to your procedure. . Antibiotics: Inform the staff if you have a condition or reason that requires you to take antibiotics before dental procedures. . Pregnancy: If you are pregnant, call and cancel the procedure. . Sickness: If you have a cold, fever, or any active infections, call and cancel the procedure. . Arrival: You must be in the facility at least 30 minutes prior to your scheduled procedure. . Children: Do not bring any children with you. . Dress  appropriately: Bring dark clothing that you would not mind if they get stained. . Valuables: Do not bring any jewelry or valuables.  Reasons to call and reschedule or cancel your procedure: (Following these recommendations will minimize the risk of a serious complication.) . Surgeries: Avoid having procedures within 2 weeks of any surgery. (Avoid for 2 weeks before or after any surgery). . Flu Shots: Avoid having procedures within 2 weeks of a flu shots or . (Avoid for 2 weeks before or after immunizations). . Barium: Avoid having a procedure within 7-10 days after having had a radiological study involving the use of radiological contrast. (Myelograms, Barium swallow or enema study). . Heart attacks: Avoid any elective procedures or surgeries for the initial 6 months after a "Myocardial Infarction" (Heart Attack). . Blood thinners: It is imperative that you stop these medications before procedures. Let us know if you if you take any blood thinner.  . Infection: Avoid procedures during or within two weeks of an infection (including chest colds or gastrointestinal problems). Symptoms associated with infections include: Localized redness, fever, chills, night sweats or profuse sweating, burning sensation when voiding, cough, congestion, stuffiness, runny nose, sore throat, diarrhea, nausea, vomiting, cold or Flu symptoms, recent or current infections. It is specially important if the infection is over the area that we intend to treat. . Heart and lung problems: Symptoms that may suggest an active cardiopulmonary problem include: cough, chest pain, breathing difficulties or shortness of breath, dizziness, ankle swelling, uncontrolled high or unusually low blood pressure, and/or palpitations. If you are experiencing any of these symptoms, cancel your procedure and contact your primary care physician for an evaluation.  Remember:  Regular   Business hours are:  Monday to Thursday 8:00 AM to 4:00  PM  Provider's Schedule: Raven Furnas, MD:  Procedure days: Tuesday and Thursday 7:30 AM to 4:00 PM  Bilal Lateef, MD:  Procedure days: Monday and Wednesday 7:30 AM to 4:00 PM ____________________________________________________________________________________________    

## 2020-05-06 NOTE — Progress Notes (Signed)
Safety precautions to be maintained throughout the outpatient stay will include: orient to surroundings, keep bed in low position, maintain call bell within reach at all times, provide assistance with transfer out of bed and ambulation.  

## 2020-05-15 DIAGNOSIS — M4606 Spinal enthesopathy, lumbar region: Secondary | ICD-10-CM | POA: Insufficient documentation

## 2020-05-15 DIAGNOSIS — M545 Low back pain, unspecified: Secondary | ICD-10-CM | POA: Insufficient documentation

## 2020-05-15 DIAGNOSIS — G8929 Other chronic pain: Secondary | ICD-10-CM | POA: Insufficient documentation

## 2020-05-15 NOTE — Progress Notes (Signed)
PROVIDER NOTE: Information contained herein reflects review and annotations entered in association with encounter. Interpretation of such information and data should be left to medically-trained personnel. Information provided to patient can be located elsewhere in the medical record under "Patient Instructions". Document created using STT-dictation technology, any transcriptional errors that may result from process are unintentional.    Patient: Kristen Pennington  Service Category: Procedure  Provider: Gaspar Cola, MD  DOB: Dec 14, 1928  DOS: 05/16/2020  Location: [facility]  MRN: [id number]  Setting: Ambulatory - outpatient  Referring Provider: Lavera Guise, MD  Type: Established Patient  Specialty: Interventional Pain Management  PCP: Lavera Guise, MD   Primary Reason for Visit: Interventional Pain Management Treatment. CC: Back Pain (lumbar bilateral )  Procedure:          Anesthesia, Analgesia, Anxiolysis:  Type: Ligament/Tendon insertion (93235) Injection.  #1  Purpose: Diagnostic Target Area: Lumbar interspinous ligament Region: Lumbosacral area Approach: Percutanous Level: L3-4 Laterality: Midline  Type: Local Anesthesia Indication(s): Analgesia         Local Anesthetic: Lidocaine 1-2% Route: Infiltration ([place]/IM) IV Access: Declined Sedation: Declined   Position: Supine   Indications: 1. Chronic low back pain (Midline) w/o sciatica   2. 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 Kissing spine syndrome   4. History of lumbar spinal fusion    Pain Score: Pre-procedure: 8 /10 Post-procedure: 0-No pain/10   Pre-op Assessment:  Kristen Pennington is a 86 y.o. (year old), female patient, seen today for interventional treatment. She  has a past surgical history that includes Spine surgery; Rotator cuff repair (2007); Dilation and curettage of uterus (1970); Colonoscopy with propofol (N/A, 05/03/2015); polypectomy (05/03/2015); Colonoscopy (05-03-15); Eye  surgery (Right, 2017); and Endometrial biopsy. Kristen Pennington has a current medication list which includes the following prescription(s): bisoprolol-hydrochlorothiazide, carboxymethylcellulose sodium, vitamin d3, dicyclomine, escitalopram, estradiol, fluticasone, hydroxyzine, menthol-methyl salicylate, lubiprostone, meloxicam, centrum silver 50+women, oxybutynin, pantoprazole, systane, promethazine, ropinirole, simethicone, trazodone, and xiidra. Her primarily concern today is the Back Pain (lumbar bilateral )  Initial Vital Signs:  Pulse/HCG Rate: 61  Temp: (!) 97.2 F (36.2 C) Resp: 16 BP: (!) 161/93 SpO2: 100 %  BMI: Estimated body mass index is 24.96 kg/m as calculated from the following:   Height as of this encounter: 5\' 5"  (1.651 m).   Weight as of this encounter: 150 lb (68 kg).  Risk Assessment: Allergies: Reviewed. She has No Known Allergies.  Allergy Precautions: None required Coagulopathies: Reviewed. None identified.  Blood-thinner therapy: None at this time Active Infection(s): Reviewed. None identified. Kristen Pennington is afebrile  Site Confirmation: Kristen Pennington was asked to confirm the procedure and laterality before marking the site Procedure checklist: Completed Consent: Before the procedure and under the influence of no sedative(s), amnesic(s), or anxiolytics, the patient was informed of the treatment options, risks and possible complications. To fulfill our ethical and legal obligations, as recommended by the American Medical Association's Code of Ethics, I have informed the patient of my clinical impression; the nature and purpose of the treatment or procedure; the risks, benefits, and possible complications of the intervention; the alternatives, including doing nothing; the risk(s) and benefit(s) of the alternative treatment(s) or procedure(s); and the risk(s) and benefit(s) of doing nothing. The patient was provided information about the general risks and possible  complications associated with the procedure. These may include, but are not limited to: failure to achieve desired goals, infection, bleeding, organ or nerve damage, allergic reactions, paralysis, and death.  In addition, the patient was informed of those risks and complications associated to the procedure, such as failure to decrease pain; infection; bleeding; organ or nerve damage with subsequent damage to sensory, motor, and/or autonomic systems, resulting in permanent pain, numbness, and/or weakness of one or several areas of the body; allergic reactions; (i.e.: anaphylactic reaction); and/or death. Furthermore, the patient was informed of those risks and complications associated with the medications. These include, but are not limited to: allergic reactions (i.e.: anaphylactic or anaphylactoid reaction(s)); adrenal axis suppression; blood sugar elevation that in diabetics may result in ketoacidosis or comma; water retention that in patients with history of congestive heart failure may result in shortness of breath, pulmonary edema, and decompensation with resultant heart failure; weight gain; swelling or edema; medication-induced neural toxicity; particulate matter embolism and blood vessel occlusion with resultant organ, and/or nervous system infarction; and/or aseptic necrosis of one or more joints. Finally, the patient was informed that Medicine is not an exact science; therefore, there is also the possibility of unforeseen or unpredictable risks and/or possible complications that may result in a catastrophic outcome. The patient indicated having understood very clearly. We have given the patient no guarantees and we have made no promises. Enough time was given to the patient to ask questions, all of which were answered to the patient's satisfaction. Kristen Pennington has indicated that she wanted to continue with the procedure. Attestation: I, the ordering provider, attest that I have discussed with the  patient the benefits, risks, side-effects, alternatives, likelihood of achieving goals, and potential problems during recovery for the procedure that I have provided informed consent. Date  Time: 05/16/2020 11:02 AM  Pre-Procedure Preparation:  Monitoring: As per clinic protocol. Respiration, ETCO2, SpO2, BP, heart rate and rhythm monitor placed and checked for adequate function Safety Precautions: Patient was assessed for positional comfort and pressure points before starting the procedure. Time-out: I initiated and conducted the "Time-out" before starting the procedure, as per protocol. The patient was asked to participate by confirming the accuracy of the "Time Out" information. Verification of the correct person, site, and procedure were performed and confirmed by me, the nursing staff, and the patient. "Time-out" conducted as per Joint Commission's Universal Protocol (UP.01.01.01). Time: 1136  Description of Procedure:          Area Prepped: Lumbosacral area DuraPrep (Iodine Povacrylex [0.7% available iodine] and Isopropyl Alcohol, 74% w/w) Safety Precautions: Aspiration looking for blood return was conducted prior to all injections. At no point did we inject any substances, as a needle was being advanced. No attempts were made at seeking any paresthesias. Safe injection practices and needle disposal techniques used. Medications properly checked for expiration dates. SDV (single dose vial) medications used. Description of the Procedure: Protocol guidelines were followed. The patient was placed in position. The target area was identified and prepped in the usual manner. Skin & deeper tissues infiltrated with local anesthetic. Appropriate time provided for local anesthetics to take effect. The procedure needle was slowly advanced to target area. Proper needle placement secured. Negative aspiration confirmed. Solution injected in intermittent fashion, asking for systemic symptoms every 0.5cc.  Needle(s) removed and area cleaned, making sure to leave some prepping solution back to take advantage of its long term bactericidal properties.  Vitals:   05/16/20 1100 05/16/20 1130 05/16/20 1142  BP: (!) 161/93 (!) 172/73 (!) 170/65  Pulse: 61 67 (!) 57  Resp: 16 15 14   Temp: (!) 97.2 F (36.2 C)    TempSrc: Temporal  SpO2: 100% 99% 100%  Weight: 150 lb (68 kg)    Height: 5\' 5"  (1.651 m)      Start Time: 1136 hrs. End Time: 1141 hrs. Materials:  Needle(s) Type: Epidural needle Gauge: 20G Length: 3.5-in Medication(s): Please see orders for medications and dosing details.  Imaging Guidance:          Type of Imaging Technique: Fluoroscopy Guidance (Spinal) Indication(s): Assistance in needle guidance and placement for procedures requiring needle placement in or near specific anatomical locations not easily accessible without such assistance. Exposure Time: Please see nurses notes. Contrast: None used. Fluoroscopic Guidance: I was personally present during the use of fluoroscopy. "Tunnel Vision Technique" used to obtain the best possible view of the target area. Parallax error corrected before commencing the procedure. "Direction-depth-direction" technique used to introduce the needle under continuous pulsed fluoroscopy. Once target was reached, antero-posterior, oblique, and lateral fluoroscopic projection used confirm needle placement in all planes. Images permanently stored in EMR. Ultrasound Guidance: N/A               Interpretation: No contrast injected. I personally interpreted the imaging intraoperatively. Adequate needle placement confirmed in multiple planes. Permanent images saved into the patient's record.  Post-operative Assessment:  Post-procedure Vital Signs:  Pulse/HCG Rate: (!) 57 (SB)  Temp: (!) 97.2 F (36.2 C) Resp: 14 BP: (!) 170/65 SpO2: 100 %  EBL: None  Complications: No immediate post-treatment complications observed by team, or reported by  patient.  Note: The patient tolerated the entire procedure well. A repeat set of vitals were taken after the procedure and the patient was kept under observation following institutional policy, for this type of procedure. Post-procedural neurological assessment was performed, showing return to baseline, prior to discharge. The patient was provided with post-procedure discharge instructions, including a section on how to identify potential problems. Should any problems arise concerning this procedure, the patient was given instructions to immediately contact us, at any time, without hesitation. In any case, we plan to contact the patient by telephone for a follow-up status report regarding this interventional procedure.  Comments:  No additional relevant information.  Plan of Care  Orders:  Orders Placed This Encounter  Procedures  . Injection tendon or ligament    Scheduling Instructions:     Type of Block:  Interspinous process ligament injection     Side: Midline     Sedation: No Sedation.     Timeframe: Today  . HIP INJECTION    Standing Status:   Future    Standing Expiration Date:   08/16/2020    Scheduling Instructions:     Side: Bilateral     Sedation: Patient's choice.     Timeframe: 2 weeks from now  . [facility] C-ARM 1-60 MIN NO REPORT    Intraoperative interpretation by procedural physician at [facility].    Standing Status:   Standing    Number of Occurrences:   1    Order Specific Question:   Reason for exam:    Answer:   Assistance in needle guidance and placement for procedures requiring needle placement in or near specific anatomical locations not easily accessible without such assistance.  . Informed Consent Details: Physician/Practitioner Attestation; Transcribe to consent form and obtain patient signature    Order Specific Question:   Physician/Practitioner attestation of informed consent for procedure/surgical case    Answer:   I, the  physician/practitioner, attest that I have discussed with the patient the benefits, risks, side effects,  alternatives, likelihood of achieving goals and potential problems during recovery for the procedure that I have provided informed consent.    Order Specific Question:   Procedure    Answer:   Interspinous ligament injection    Order Specific Question:   Physician/Practitioner performing the procedure    Answer:   Milinda Pointer MD    Order Specific Question:   Indication/Reason    Answer:   Midline low back pain without sciatica secondary lumbar spinal enthesopathy  . Provide equipment / supplies at bedside    "Block Tray" (Disposable  single use) Needle type: SpinalRegular Amount/quantity: 1 Size: Short(1.5-inch) Gauge: 25G    Standing Status:   Standing    Number of Occurrences:   1    Order Specific Question:   Specify    Answer:   Block Tray   Chronic Opioid Analgesic:  No opioid analgesics prescribed by our practice.  She used to get a fentanyl 12.5 mcg/h patch from [place], but we discontinued it due to improvement of pain and cognitive impairment secondary to the medication. Highest recorded MME/day: 28.8 mg/day MME/day: 0 mg/day   Medications ordered for procedure: Meds ordered this encounter  Medications  . lidocaine (XYLOCAINE) 2 % (with pres) injection 400 mg  . ropivacaine (PF) 2 mg/mL (0.2%) (NAROPIN) injection 4 mL  . methylPREDNISolone acetate (DEPO-MEDROL) injection 80 mg   Medications administered: We administered lidocaine, ropivacaine (PF) 2 mg/mL (0.2%), and methylPREDNISolone acetate.  See the medical record for exact dosing, route, and time of administration.  Follow-up plan:   Return in 2 weeks (on 05/30/2020) for (F2F), (PP) Follow-up, in addition, Procedure (no sedation): (B) Hip inj #2.       Interventional management options: Planned, scheduled, and/or pending:    Diagnostic midline interspinous process injection #1 (for diagnosis of kissing spine  syndrome)   Considering:   Diagnostic bilateral lumbar facet block  Diagnostic bilateral SI joint block  Possible diagnostic bilateral femoral nerve and obturator nerve block  Diagnostic midline L3-4 LESI  Diagnostic midline caudal ESI    PRN Procedures:   Palliative bilateral IA hip joint injection #2 (100/100/90/100)     Recent Visits Date Type Provider Dept  05/06/20 Office Visit Milinda Pointer, [facility]  03/19/20 Procedure visit Milinda Pointer, MD [facility]  Showing recent visits within past 90 days and meeting all other requirements Today's Visits Date Type Provider Dept  05/16/20 Procedure visit Milinda Pointer, MD [facility]  Showing today's visits and meeting all other requirements Future Appointments Date Type Provider Dept  05/30/20 Appointment Milinda Pointer, MD [facility]  Showing future appointments within next 90 days and meeting all other requirements  Disposition: Discharge home  Discharge (Date  Time): 05/16/2020; 1148 hrs.   Primary Care Physician: Lavera Guise, MD Location: [facility] Note by: Gaspar Cola, MD Date: 05/16/2020; Time: 12:32 PM  Disclaimer:  Medicine is not an exact science. The only guarantee in medicine is that nothing is guaranteed. It is important to note that the decision to proceed with this intervention was based on the information collected from the patient. The Data and conclusions were drawn from the patient's questionnaire, the interview, and the physical examination. Because the information was provided in large part by the patient, it cannot be guaranteed that it has not been purposely or unconsciously manipulated. Every effort has been made to obtain as much relevant data as possible for this evaluation. It is important  to note that the conclusions that lead to this procedure are derived in large part from the available data. Always  take into account that the treatment will also be dependent on availability of resources and existing treatment guidelines, considered by other Pain Management Practitioners as being common knowledge and practice, at the time of the intervention. For Medico-Legal purposes, it is also important to point out that variation in procedural techniques and pharmacological choices are the acceptable norm. The indications, contraindications, technique, and results of the above procedure should only be interpreted and judged by a Board-Certified Interventional Pain Specialist with extensive familiarity and expertise in the same exact procedure and technique.

## 2020-05-15 NOTE — Patient Instructions (Addendum)
____________________________________________________________________________________________  Post-Procedure Discharge Instructions  Instructions:  Apply ice:   Purpose: This will minimize any swelling and discomfort after procedure.   When: Day of procedure, as soon as you get home.  How: Fill a plastic sandwich bag with crushed ice. Cover it with a small towel and apply to injection site.  How long: (15 min on, 15 min off) Apply for 15 minutes then remove x 15 minutes.  Repeat sequence on day of procedure, until you go to bed.  Apply heat:   Purpose: To treat any soreness and discomfort from the procedure.  When: Starting the next day after the procedure.  How: Apply heat to procedure site starting the day following the procedure.  How long: May continue to repeat daily, until discomfort goes away.  Food intake: Start with clear liquids (like water) and advance to regular food, as tolerated.   Physical activities: Keep activities to a minimum for the first 8 hours after the procedure. After that, then as tolerated.  Driving: If you have received any sedation, be responsible and do not drive. You are not allowed to drive for 24 hours after having sedation.  Blood thinner: (Applies only to those taking blood thinners) You may restart your blood thinner 6 hours after your procedure.  Insulin: (Applies only to Diabetic patients taking insulin) As soon as you can eat, you may resume your normal dosing schedule.  Infection prevention: Keep procedure site clean and dry. Shower daily and clean area with soap and water.  Post-procedure Pain Diary: Extremely important that this be done correctly and accurately. Recorded information will be used to determine the next step in treatment. For the purpose of accuracy, follow these rules:  Evaluate only the area treated. Do not report or include pain from an untreated area. For the purpose of this evaluation, ignore all other areas of pain,  except for the treated area.  After your procedure, avoid taking a long nap and attempting to complete the pain diary after you wake up. Instead, set your alarm clock to go off every hour, on the hour, for the initial 8 hours after the procedure. Document the duration of the numbing medicine, and the relief you are getting from it.  Do not go to sleep and attempt to complete it later. It will not be accurate. If you received sedation, it is likely that you were given a medication that may cause amnesia. Because of this, completing the diary at a later time may cause the information to be inaccurate. This information is needed to plan your care.  Follow-up appointment: Keep your post-procedure follow-up evaluation appointment after the procedure (usually 2 weeks for most procedures, 6 weeks for radiofrequencies). DO NOT FORGET to bring you pain diary with you.   Expect: (What should I expect to see with my procedure?)  From numbing medicine (AKA: Local Anesthetics): Numbness or decrease in pain. You may also experience some weakness, which if present, could last for the duration of the local anesthetic.  Onset: Full effect within 15 minutes of injected.  Duration: It will depend on the type of local anesthetic used. On the average, 1 to 8 hours.   From steroids (Applies only if steroids were used): Decrease in swelling or inflammation. Once inflammation is improved, relief of the pain will follow.  Onset of benefits: Depends on the amount of swelling present. The more swelling, the longer it will take for the benefits to be seen. In some cases, up to 10 days.    Duration: Steroids will stay in the system x 2 weeks. Duration of benefits will depend on multiple posibilities including persistent irritating factors.  Side-effects: If present, they may typically last 2 weeks (the duration of the steroids).  Frequent: Cramps (if they occur, drink Gatorade and take over-the-counter Magnesium 450-500 mg  once to twice a day); water retention with temporary weight gain; increases in blood sugar; decreased immune system response; increased appetite.  Occasional: Facial flushing (red, warm cheeks); mood swings; menstrual changes.  Uncommon: Long-term decrease or suppression of natural hormones; bone thinning. (These are more common with higher doses or more frequent use. This is why we prefer that our patients avoid having any injection therapies in other practices.)   Very Rare: Severe mood changes; psychosis; aseptic necrosis.  From procedure: Some discomfort is to be expected once the numbing medicine wears off. This should be minimal if ice and heat are applied as instructed.  Call if: (When should I call?)  You experience numbness and weakness that gets worse with time, as opposed to wearing off.  New onset bowel or bladder incontinence. (Applies only to procedures done in the spine)  Emergency Numbers:  Durning business hours (Monday - Thursday, 8:00 AM - 4:00 PM) (Friday, 9:00 AM - 12:00 Noon): [phone]  After hours: [phone]  NOTE: If you are having a problem and are unable connect with, or to talk to a provider, then go to your nearest urgent care or emergency department. If the problem is serious and urgent, please call 911. ____________________________________________________________________________________________   ____________________________________________________________________________________________  Preparing for your procedure (without sedation)  Procedure appointments are limited to planned procedures: . No Prescription Refills. . No disability issues will be discussed. . No medication changes will be discussed.  Instructions: . Oral Intake: Do not eat or drink anything for at least 6 hours prior to your procedure. (Exception: Blood Pressure Medication. See below.) . Transportation: Unless otherwise stated by your physician, you may drive yourself  after the procedure. . Blood Pressure Medicine: Do not forget to take your blood pressure medicine with a sip of water the morning of the procedure. If your Diastolic (lower reading)is above 100 mmHg, elective cases will be cancelled/rescheduled. . Blood thinners: These will need to be stopped for procedures. Notify our staff if you are taking any blood thinners. Depending on which one you take, there will be specific instructions on how and when to stop it. . Diabetics on insulin: Notify the staff so that you can be scheduled 1st case in the morning. If your diabetes requires high dose insulin, take only  of your normal insulin dose the morning of the procedure and notify the staff that you have done so. . Preventing infections: Shower with an antibacterial soap the morning of your procedure.  . Build-up your immune system: Take 1000 mg of Vitamin C with every meal (3 times a day) the day prior to your procedure. . Antibiotics: Inform the staff if you have a condition or reason that requires you to take antibiotics before dental procedures. . Pregnancy: If you are pregnant, call and cancel the procedure. . Sickness: If you have a cold, fever, or any active infections, call and cancel the procedure. . Arrival: You must be in the facility at least 30 minutes prior to your scheduled procedure. . Children: Do not bring any children with you. . Dress appropriately: Bring dark clothing that you would not mind if they get stained. . Valuables: Do not bring any jewelry   or valuables.  Reasons to call and reschedule or cancel your procedure: (Following these recommendations will minimize the risk of a serious complication.) . Surgeries: Avoid having procedures within 2 weeks of any surgery. (Avoid for 2 weeks before or after any surgery). . Flu Shots: Avoid having procedures within 2 weeks of a flu shots or . (Avoid for 2 weeks before or after immunizations). . Barium: Avoid having a procedure within 7-10  days after having had a radiological study involving the use of radiological contrast. (Myelograms, Barium swallow or enema study). . Heart attacks: Avoid any elective procedures or surgeries for the initial 6 months after a "Myocardial Infarction" (Heart Attack). . Blood thinners: It is imperative that you stop these medications before procedures. Let us know if you if you take any blood thinner.  . Infection: Avoid procedures during or within two weeks of an infection (including chest colds or gastrointestinal problems). Symptoms associated with infections include: Localized redness, fever, chills, night sweats or profuse sweating, burning sensation when voiding, cough, congestion, stuffiness, runny nose, sore throat, diarrhea, nausea, vomiting, cold or Flu symptoms, recent or current infections. It is specially important if the infection is over the area that we intend to treat. . Heart and lung problems: Symptoms that may suggest an active cardiopulmonary problem include: cough, chest pain, breathing difficulties or shortness of breath, dizziness, ankle swelling, uncontrolled high or unusually low blood pressure, and/or palpitations. If you are experiencing any of these symptoms, cancel your procedure and contact your primary care physician for an evaluation.  Remember:  Regular Business hours are:  Monday to Thursday 8:00 AM to 4:00 PM  Provider's Schedule: Adryana Mogensen, MD:  Procedure days: Tuesday and Thursday 7:30 AM to 4:00 PM  Bilal Lateef, MD:  Procedure days: Monday and Wednesday 7:30 AM to 4:00 PM ____________________________________________________________________________________________    

## 2020-05-16 ENCOUNTER — Ambulatory Visit (HOSPITAL_BASED_OUTPATIENT_CLINIC_OR_DEPARTMENT_OTHER): Payer: Medicare HMO | Admitting: Pain Medicine

## 2020-05-16 ENCOUNTER — Ambulatory Visit
Admission: RE | Admit: 2020-05-16 | Discharge: 2020-05-16 | Disposition: A | Discharge status: Home / Self Care | Payer: Medicare HMO | Source: Ambulatory Visit | Attending: Pain Medicine | Admitting: Pain Medicine

## 2020-05-16 ENCOUNTER — Other Ambulatory Visit: Payer: Self-pay

## 2020-05-16 ENCOUNTER — Encounter: Payer: Self-pay | Admitting: Pain Medicine

## 2020-05-16 VITALS — BP 170/65 | HR 57 | Temp 97.2°F | Resp 14 | Ht 65.0 in | Wt 150.0 lb

## 2020-05-16 DIAGNOSIS — Z981 Arthrodesis status: Secondary | ICD-10-CM | POA: Insufficient documentation

## 2020-05-16 DIAGNOSIS — M4606 Spinal enthesopathy, lumbar region: Secondary | ICD-10-CM | POA: Insufficient documentation

## 2020-05-16 DIAGNOSIS — M4820 Kissing spine, site unspecified: Secondary | ICD-10-CM

## 2020-05-16 DIAGNOSIS — G8929 Other chronic pain: Secondary | ICD-10-CM | POA: Insufficient documentation

## 2020-05-16 DIAGNOSIS — M160 Bilateral primary osteoarthritis of hip: Secondary | ICD-10-CM

## 2020-05-16 DIAGNOSIS — M25551 Pain in right hip: Secondary | ICD-10-CM | POA: Insufficient documentation

## 2020-05-16 DIAGNOSIS — M25552 Pain in left hip: Secondary | ICD-10-CM | POA: Diagnosis present

## 2020-05-16 DIAGNOSIS — M545 Low back pain, unspecified: Secondary | ICD-10-CM | POA: Insufficient documentation

## 2020-05-16 MED ORDER — ROPIVACAINE HCL 2 MG/ML IJ SOLN
INTRAMUSCULAR | Status: AC
  Filled 2020-05-16: qty 10

## 2020-05-16 MED ORDER — LIDOCAINE HCL 2 % IJ SOLN
20.0000 mL | Freq: Once | INTRAMUSCULAR | Status: AC
  Administered 2020-05-16: 400 mg

## 2020-05-16 MED ORDER — ROPIVACAINE HCL 2 MG/ML IJ SOLN
4.0000 mL | Freq: Once | INTRAMUSCULAR | Status: AC
  Administered 2020-05-16: 4 mL

## 2020-05-16 MED ORDER — METHYLPREDNISOLONE ACETATE 80 MG/ML IJ SUSP
80.0000 mg | Freq: Once | INTRAMUSCULAR | Status: AC
  Administered 2020-05-16: 80 mg via INTRA_ARTICULAR

## 2020-05-16 MED ORDER — LIDOCAINE HCL 2 % IJ SOLN
INTRAMUSCULAR | Status: AC
  Filled 2020-05-16: qty 20

## 2020-05-16 MED ORDER — METHYLPREDNISOLONE ACETATE 80 MG/ML IJ SUSP
INTRAMUSCULAR | Status: AC
  Filled 2020-05-16: qty 1

## 2020-05-17 ENCOUNTER — Telehealth: Payer: Self-pay

## 2020-05-17 NOTE — Telephone Encounter (Signed)
Denies any needs at this time. States she had a good night. Instructed to call if needed. Patient with understanding.

## 2020-05-20 ENCOUNTER — Telehealth: Payer: Self-pay

## 2020-05-20 NOTE — Telephone Encounter (Signed)
I called her to let her know when her appt for her hip injection and she told me she fell Saturday and hurt her shoulder, hip and finger. She has not been to the doctor. She said her shoulder was hurting really bad and I told her she needs to go to the ER or to her regular doctor and have it checked out. She will call us back to update [place].

## 2020-05-21 ENCOUNTER — Other Ambulatory Visit: Payer: Self-pay

## 2020-05-21 ENCOUNTER — Telehealth (INDEPENDENT_AMBULATORY_CARE_PROVIDER_SITE_OTHER): Payer: Medicare HMO | Admitting: Psychiatry

## 2020-05-21 ENCOUNTER — Encounter: Payer: Self-pay | Admitting: Psychiatry

## 2020-05-21 DIAGNOSIS — F5105 Insomnia due to other mental disorder: Secondary | ICD-10-CM | POA: Diagnosis not present

## 2020-05-21 DIAGNOSIS — R69 Illness, unspecified: Secondary | ICD-10-CM | POA: Diagnosis not present

## 2020-05-21 DIAGNOSIS — F411 Generalized anxiety disorder: Secondary | ICD-10-CM

## 2020-05-21 DIAGNOSIS — Z8659 Personal history of other mental and behavioral disorders: Secondary | ICD-10-CM | POA: Diagnosis not present

## 2020-05-21 DIAGNOSIS — F3342 Major depressive disorder, recurrent, in full remission: Secondary | ICD-10-CM

## 2020-05-21 MED ORDER — HYDROXYZINE HCL 10 MG PO TABS: 10.0000 mg | ORAL_TABLET | Freq: Three times a day (TID) | ORAL | 3 refills | Status: DC | PRN

## 2020-05-21 MED ORDER — ESCITALOPRAM OXALATE 20 MG PO TABS: 20.0000 mg | ORAL_TABLET | Freq: Every day | ORAL | 0 refills | Status: DC

## 2020-05-21 NOTE — Progress Notes (Signed)
Virtual Visit via Telephone Note  I connected with Kristen Pennington on 05/21/20 at 10:00 AM EST by telephone and verified that I am speaking with the correct person using two identifiers.    I discussed the limitations, risks, security and privacy concerns of performing an evaluation and management service by telephone and the availability of in person appointments. I also discussed with the patient that there may be a patient responsible charge related to this service. The patient expressed understanding and agreed to proceed.  Location Provider Location : [facility] Patient Location : Home  Participants: Patient , Provider     I discussed the assessment and treatment plan with the patient. The patient was provided an opportunity to ask questions and all were answered. The patient agreed with the plan and demonstrated an understanding of the instructions.   The patient was advised to call back or seek an in-person evaluation if the symptoms worsen or if the condition fails to improve as anticipated.   Haivana Nakya MD OP Progress Note  05/21/2020 10:30 AM Kristen Pennington  MRN:  [id number]  Chief Complaint:  Chief Complaint    Follow-up     HPI: Kristen Pennington is a 86 year old Caucasian female, widowed, lives in [place], has a history of GAD, MDD, delusional disorder, insomnia, hearing loss, vision loss, OSA, hypertension was evaluated by phone today.  Patient today reports she recently had a minor accident while she was cooking.  She reports she had a rush to take her pan off the stove since it had started burning and while she rushed she tripped and fell.  She reports she hit her left hip and shoulder on the floor and currently is in pain.  She may also have slightly burned her middle finger however that is not too bad.  She however reports she wants to go and get herself evaluated and does not know whether she should go today or wait another day or two.  Patient denies any suicidality,  homicidality or perceptual disturbances.  She reports sleep is good on the trazodone.  She is anxious about her health however.  She reports she is compliant on medications.  They are beneficial.  She denies any side effects to medications.  She did not seem to be preoccupied with any delusions.  She denies any other concerns today.    Visit Diagnosis:    ICD-10-CM   1. Generalized anxiety disorder  F41.1 escitalopram (LEXAPRO) 20 MG tablet  2. MDD (major depressive disorder), recurrent, in full remission ([place])  F33.42   3. Insomnia due to mental condition  F51.05 hydrOXYzine (ATARAX/VISTARIL) 10 MG tablet  4. History of delusional disorder  Z86.59     Past Psychiatric History: I have reviewed past psychiatric history from my progress note on 07/25/2018.  Past trials of Klonopin, Abilify, Lexapro, mirtazapine.  Past Medical History:  Past Medical History:  Diagnosis Date  . Arthritis   . Atrophic vaginitis 03/06/2013   Last Assessment & Plan:  She will plan to continue estradiol vaginal cream.  Prescription was rewritten stipulating use of the generic product.   . Atypical migraine 03/06/2013   Last Assessment & Plan:  Since she rarely takes sumatriptan, we will discontinue it. Continue sparing use of OTC migraine products.   . Avitaminosis D 03/06/2013   Last Assessment & Plan:  Recheck vitamin D level. Plan to continue vitamin D supplementation.   . Ceratitis 08/01/2013   Last Assessment & Plan:  Because of the expense, she  will discontinue Restasis. She will use over-the-counter moisturizing eyedrops and liquid gel.   . Colon polyp   . 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   . 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   . Cystocele, midline 03/06/2013  . Demoralization and apathy 03/06/2013   Last  Assessment & Plan:  Since she has been taking bupropion only once a day, I will write the prescription with the correct instructions and correct quantity. She has done well on this for years and she is encouraged to take it regularly   . Depression   . Environmental allergies   . Epistaxis   . Essential (primary) hypertension 05/17/2013   Last Assessment & Plan:  Her blood pressure is well-controlled. We will plan to continue hydrochlorothiazide and benazepril.  Both are generic and should be affordable on her health plan.   Marland Kitchen GERD (gastroesophageal reflux disease)   . Hemorrhoid   . Inflammation of sacroiliac joint ([place]) 03/06/2013   Last Assessment & Plan:  She is doing well on her present regimen of fentanyl patch supplemented with sparing use of tramadol/acetaminophen. We will continue that regimen.   . Sinusitis   . Sleep apnea    CPAP    Past Surgical History:  Procedure Laterality Date  . COLONOSCOPY  05-03-15  . COLONOSCOPY WITH PROPOFOL N/A 05/03/2015   Procedure: COLONOSCOPY WITH PROPOFOL;  Surgeon: Lucilla Lame, MD;  Location: [place];  Service: Endoscopy;  Laterality: N/A;  CPAP  . DILATION AND CURETTAGE OF UTERUS  1970  . ENDOMETRIAL BIOPSY    . EYE SURGERY Right 2017  . POLYPECTOMY  05/03/2015   Procedure: POLYPECTOMY;  Surgeon: Lucilla Lame, MD;  Location: [facility];  Service: Endoscopy;;  . ROTATOR CUFF REPAIR  2007  . SPINE SURGERY      Family Psychiatric History: I have reviewed family psychiatric history from my progress note on 07/25/2018.  Family History:  Family History  Problem Relation Age of Onset  . Arthritis Mother   . Alzheimer's disease Father   . Heart disease Sister   . Cancer Brother   . Ovarian cancer Neg Hx   . Breast cancer Neg Hx   . Colon cancer Neg Hx   . Diabetes Neg Hx     Social History: I have reviewed social history from my progress note on 07/25/2018. Social History   Socioeconomic History  . Marital status:  Widowed    Spouse name: Not on file  . Number of children: 2  . Years of education: Not on file  . Highest education level: Some college, no degree  Occupational History  . Not on file  Tobacco Use  . Smoking status: Never Smoker  . Smokeless tobacco: Never Used  Vaping Use  . Vaping Use: Never used  Substance and Sexual Activity  . Alcohol use: No    Alcohol/week: 0.0 standard drinks  . Drug use: No  . Sexual activity: Not Currently  Other Topics Concern  . Not on file  Social History Narrative  . Not on file   Social Determinants of Health   Financial Resource Strain:   . Difficulty of Paying Living Expenses: Not on file  Food Insecurity:   . Worried About Charity fundraiser in the Last Year: Not on file  . Ran Out  of Food in the Last Year: Not on file  Transportation Needs:   . Lack of Transportation (Medical): Not on file  . Lack of Transportation (Non-Medical): Not on file  Physical Activity:   . Days of Exercise per Week: Not on file  . Minutes of Exercise per Session: Not on file  Stress:   . Feeling of Stress : Not on file  Social Connections:   . Frequency of Communication with Friends and Family: Not on file  . Frequency of Social Gatherings with Friends and Family: Not on file  . Attends Religious Services: Not on file  . Active Member of Clubs or Organizations: Not on file  . Attends Archivist Meetings: Not on file  . Marital Status: Not on file    Allergies: No Known Allergies  Metabolic Disorder Labs: Lab Results  Component Value Date   HGBA1C 5.4 08/01/2018   Lab Results  Component Value Date   PROLACTIN 8.1 08/01/2018   Lab Results  Component Value Date   CHOL 245 (H) 08/01/2018   TRIG 187 (H) 08/01/2018   HDL 49 08/01/2018   LDLCALC 159 (H) 08/01/2018   Lab Results  Component Value Date   TSH 2.830 08/10/2018   TSH 5.550 (H) 08/01/2018    Therapeutic Level Labs: No results found for: LITHIUM No results found for:  VALPROATE No components found for:  CBMZ  Current Medications: Current Outpatient Medications  Medication Sig Dispense Refill  . bisoprolol-hydrochlorothiazide (ZIAC) 5-6.25 MG tablet Take 1 tablet by mouth daily. 30 tablet 3  . Carboxymethylcellulose Sodium (THERATEARS OP) Apply to eye.    . Cholecalciferol (VITAMIN D3) 5000 units TABS Take 5,000 Units by mouth daily.    Marland Kitchen dicyclomine (BENTYL) 10 MG capsule Take 10 mg by mouth 4 (four) times daily -  before meals and at bedtime.    Marland Kitchen escitalopram (LEXAPRO) 20 MG tablet Take 1 tablet (20 mg total) by mouth daily. 90 tablet 0  . estradiol (ESTRACE) 0.1 MG/GM vaginal cream Use once a week as needed 42.5 g 1  . fluticasone (FLONASE) 50 MCG/ACT nasal spray 1 SPRAY IN EACH NOSTRIL ONCE ADAY AS NEEDED 16 g 1  . hydrOXYzine (ATARAX/VISTARIL) 10 MG tablet Take 1 tablet (10 mg total) by mouth 3 (three) times daily as needed for anxiety. For severe anxiety attacks only 90 tablet 3  . Liniments (SALONPAS PAIN RELIEF PATCH EX) Apply 1 patch topically 2 (two) times daily.    Marland Kitchen lubiprostone (AMITIZA) 8 MCG capsule Take 1 capsule po bid prn ibs constipation. 60 capsule 3  . meloxicam (MOBIC) 7.5 MG tablet Take 1 tablet (7.5 mg total) by mouth daily. 30 tablet 5  . Multiple Vitamins-Minerals (CENTRUM SILVER 50+WOMEN) TABS Take by mouth.    . oxybutynin (DITROPAN-XL) 10 MG 24 hr tablet Take 1 tablet (10 mg total) by mouth daily. 30 tablet 2  . pantoprazole (PROTONIX) 40 MG tablet Take 1 tablet (40 mg total) by mouth daily. 90 tablet 1  . Polyethyl Glycol-Propyl Glycol (SYSTANE) 0.4-0.3 % SOLN Apply to eye.    . promethazine (PHENERGAN) 25 MG tablet Take 25 mg by mouth as needed.     Marland Kitchen rOPINIRole (REQUIP) 1 MG tablet TAKE ONE TABLET 3 TIMES DAILY AS NEEDED FOR RESTLESS LEGS 90 tablet 3  . simethicone (MYLICON) 80 MG chewable tablet Chew 80 mg by mouth every 6 (six) hours as needed for flatulence.    . traZODone (DESYREL) 50 MG tablet TAKE 1/2 TO 1  TABLET BY  MOUTH AT BEDTIMEAS DIRECTED, STOP TAKING REMERON (MIRTAZAPINE) 30 tablet 1  . XIIDRA 5 % SOLN Apply to eye. Sample given by eye dr     No current facility-administered medications for this visit.     Musculoskeletal: Strength & Muscle Tone: UTA Gait & Station: UTA Patient leans: N/A  Psychiatric Specialty Exam: Review of Systems  Musculoskeletal:       Left sided Hip joint , shoulder joint pain after a fall  All other systems reviewed and are negative.   There were no vitals taken for this visit.There is no height or weight on file to calculate BMI.  General Appearance: UTA  Eye Contact:  UTA  Speech:  Clear and Coherent  Volume:  Normal  Mood:  Anxious  Affect:  UTA  Thought Process:  Goal Directed and Descriptions of Associations: Intact  Orientation:  Full (Time, Place, and Person)  Thought Content: Logical   Suicidal Thoughts:  No  Homicidal Thoughts:  No  Memory:  Immediate;   Fair Recent;   Fair Remote;   Fair  Judgement:  Fair  Insight:  Fair  Psychomotor Activity:  Normal  Concentration:  Concentration: Fair and Attention Span: Fair  Recall:  [employer] of Knowledge: Fair  Language: Fair  Akathisia:  No  Handed:  Right  AIMS (if indicated): UTA  Assets:  Communication Skills Desire for Improvement Housing Social Support  ADL's:  Intact  Cognition: WNL  Sleep:  Fair   Screenings: Mini-Mental     Clinical Support from 03/07/2020 in [facility] [place] from 03/06/2019 in [facility] [place] from 03/02/2018 in [facility], [facility]  Total Score (max 30 points ) 30 30 27     PHQ2-9     Office Visit from 05/06/2020 in [facility] Support from 03/07/2020 in [facility] Office Visit from 02/14/2020 in [facility] Office Visit from 01/19/2020 in [facility] from  03/06/2019 in [facility], [facility]  PHQ-2 Total Score 0 3 0 0 0       Assessment and Plan: ANWEN CANNEDY is a 86 year old female who has a history of GAD, MDD, history of psychosis, essential hypertension, chronic pain, OSA, hearing loss, bereavement was evaluated by phone today.  Patient recently had a fall and is anxious about her health however overall is coping well.  Discussed plan as noted below.  Plan GAD-stable Lexapro 20 mg p.o. daily Patient was referred for CBT however she declined. Hydroxyzine 10 mg p.o. 3 times daily as needed for anxiety symptoms.  Insomnia-stable Trazodone 25 to 50 mg p.o. nightly as needed Continue CPAP  History of delusional disorder-we will monitor closely  Patient advised to go to the nearest emergency department or call her primary care provider for her recent fall.  Follow-up in clinic in 2 to 3 months or sooner if needed.  I have spent atleast 20 minutes non face to face with patient today. More than 50 % of the time was spent for preparing to see the patient ( e.g., review of test, records ),ordering medications and test ,psychoeducation and supportive psychotherapy and care coordination,as well as documenting clinical information in electronic health record. This note was generated in part or whole with voice recognition software. Voice recognition is usually quite accurate but there are transcription errors that can and very often do occur. I apologize  for any typographical errors that were not detected and corrected.      Ursula Alert, MD 05/21/2020, 10:30 AM

## 2020-05-22 DIAGNOSIS — S6992XA Unspecified injury of left wrist, hand and finger(s), initial encounter: Secondary | ICD-10-CM | POA: Diagnosis not present

## 2020-05-22 DIAGNOSIS — S79912A Unspecified injury of left hip, initial encounter: Secondary | ICD-10-CM | POA: Diagnosis not present

## 2020-05-22 DIAGNOSIS — Z981 Arthrodesis status: Secondary | ICD-10-CM | POA: Diagnosis not present

## 2020-05-22 DIAGNOSIS — M19012 Primary osteoarthritis, left shoulder: Secondary | ICD-10-CM | POA: Diagnosis not present

## 2020-05-22 DIAGNOSIS — M19042 Primary osteoarthritis, left hand: Secondary | ICD-10-CM | POA: Diagnosis not present

## 2020-05-22 DIAGNOSIS — I700 Atherosclerosis of aorta: Secondary | ICD-10-CM | POA: Diagnosis not present

## 2020-05-22 DIAGNOSIS — S4992XA Unspecified injury of left shoulder and upper arm, initial encounter: Secondary | ICD-10-CM | POA: Diagnosis not present

## 2020-05-30 ENCOUNTER — Other Ambulatory Visit: Payer: Self-pay

## 2020-05-30 ENCOUNTER — Encounter: Payer: Self-pay | Admitting: Pain Medicine

## 2020-05-30 ENCOUNTER — Ambulatory Visit: Payer: Medicare HMO | Attending: Pain Medicine | Admitting: Pain Medicine

## 2020-05-30 VITALS — BP 145/70 | HR 57 | Temp 97.3°F | Ht 65.0 in | Wt 157.0 lb

## 2020-05-30 DIAGNOSIS — M25551 Pain in right hip: Secondary | ICD-10-CM | POA: Insufficient documentation

## 2020-05-30 DIAGNOSIS — M25552 Pain in left hip: Secondary | ICD-10-CM | POA: Diagnosis not present

## 2020-05-30 DIAGNOSIS — G8929 Other chronic pain: Secondary | ICD-10-CM | POA: Diagnosis not present

## 2020-05-30 DIAGNOSIS — M545 Low back pain, unspecified: Secondary | ICD-10-CM | POA: Diagnosis not present

## 2020-05-30 DIAGNOSIS — M961 Postlaminectomy syndrome, not elsewhere classified: Secondary | ICD-10-CM | POA: Insufficient documentation

## 2020-05-30 DIAGNOSIS — M4606 Spinal enthesopathy, lumbar region: Secondary | ICD-10-CM | POA: Insufficient documentation

## 2020-05-30 DIAGNOSIS — M4820 Kissing spine, site unspecified: Secondary | ICD-10-CM | POA: Diagnosis not present

## 2020-05-30 DIAGNOSIS — G894 Chronic pain syndrome: Secondary | ICD-10-CM | POA: Insufficient documentation

## 2020-05-30 DIAGNOSIS — M25512 Pain in left shoulder: Secondary | ICD-10-CM | POA: Diagnosis not present

## 2020-05-30 NOTE — Progress Notes (Signed)
Safety precautions to be maintained throughout the outpatient stay will include: orient to surroundings, keep bed in low position, maintain call bell within reach at all times, provide assistance with transfer out of bed and ambulation.  

## 2020-05-30 NOTE — Progress Notes (Signed)
PROVIDER NOTE: Information contained herein reflects review and annotations entered in association with encounter. Interpretation of such information and data should be left to medically-trained personnel. Information provided to patient can be located elsewhere in the medical record under "Patient Instructions". Document created using STT-dictation technology, any transcriptional errors that may result from process are unintentional.    Patient: Kristen Pennington  Service Category: E/M  Provider: Gaspar Cola, MD  DOB: 20-Aug-1928  DOS: 05/30/2020  Specialty: Interventional Pain Management  MRN: [id number]  Setting: Ambulatory outpatient  PCP: Lavera Guise, MD  Type: Established Patient    Referring Provider: Lavera Guise, MD  Location: Office  Delivery: Face-to-face     HPI  [name], a 86 y.o. year old female, is here today because of her Chronic pain syndrome [G89.4]. [name]'s primary complain today is Shoulder Pain Last encounter: My last encounter with her was on 05/16/2020. Pertinent problems: 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 Pain Assessment: Severity of  Chronic pain is reported as a 10-Worst pain ever/10. Location: Shoulder Left (Back, left hip, left leg)/pain radiaties down left side. Onset: More than a month ago. Quality: Aching, Burning, Throbbing, Nagging, Discomfort, Constant. Timing: Constant. Modifying factor(s): laying down. Vitals:  height is _0  (1.651 m) and weight is 157 lb (71.2 kg). Her temperature is 97.3 F (36.3 C) (abnormal). Her blood pressure is 145/70 (abnormal) and her pulse is 57 (abnormal). Her oxygen saturation is 98%.   Reason for encounter: post-procedure assessment.  The patient returns to the clinic today after having had a midline L3-4 interspinous ligament injection for the diagnosis of "kissing spine syndrome".  The patient indicated 100% relief of the pain for the duration of the local anesthetic.  She was having 100% relief until she fell and then the pain started coming back.  She also hit her shoulder and hip.  She went ahead and had some x-rays done at the [facility], but unfortunately the results of that are not shared through epic.  I have explained to the patient the reason why.  In any case, her shoulder pain at this point is mostly acute, but we have decided to bring her in and try a shoulder injection see if we can decrease some of this pain for her.  According to the patient when she had her x-rays done she was told that there were no fractures.  Post-Procedure Evaluation  Procedure (05/16/2020): Diagnostic midline L3-4 interspinous ligament injection #1 under fluoroscopic guidance, no sedation Pre-procedure pain level: 8/10 Post-procedure: 0/10 (100% relief)  Sedation: None.  Effectiveness during initial hour after procedure(Ultra-Short Term Relief): 100 %.  Local anesthetic used: Long-acting (4-6 hours) Effectiveness: Defined as any analgesic benefit obtained secondary to the administration of local anesthetics. This carries significant diagnostic value as to the etiological location, or anatomical  origin, of the pain. Duration of benefit  is expected to coincide with the duration of the local anesthetic used.  Effectiveness during initial 4-6 hours after procedure(Short-Term Relief): 100 %.  Long-term benefit: Defined as any relief past the pharmacologic duration of the local anesthetics.  Effectiveness past the initial 6 hours after procedure(Long-Term Relief): 100 % (pt fell and pain came back, fell one week ago).  Current benefits: Defined as benefit that persist at this time.   Analgesia:  <50% better, after the fall. Function: Somewhat improved ROM: Somewhat improved  Pharmacotherapy Assessment   Analgesic: No opioid analgesics prescribed by our practice.  She used to get a fentanyl 12.5 mcg/h patch from [place], but we discontinued it due to improvement of pain and cognitive impairment secondary to the medication. Highest recorded MME/day: 28.8 mg/day MME/day: 0 mg/day   Monitoring: [place] PMP: PDMP reviewed during this encounter.       Pharmacotherapy: No side-effects or adverse reactions reported. Compliance: No problems identified. Effectiveness: Clinically acceptable.  Chauncey Fischer, RN  05/30/2020  8:59 AM  Sign when Signing Visit Safety precautions to be maintained throughout the outpatient stay will include: orient to surroundings, keep bed in low position, maintain call bell within reach at all times, provide assistance with transfer out of bed and ambulation.     UDS:  Summary  Date Value Ref Range Status  02/14/2020 Note  Final    Comment:    ==================================================================== Compliance Drug Analysis, Ur ==================================================================== Test                             Result       Flag       Units  Drug Present and Declared for Prescription Verification   Fentanyl                       6            EXPECTED   ng/mg creat   Norfentanyl                    32           EXPECTED   ng/mg creat     Source of fentanyl is a scheduled prescription medication, including    IV, patch, and transmucosal formulations. Norfentanyl is an expected    metabolite of fentanyl.    Citalopram                     PRESENT      EXPECTED   Desmethylcitalopram            PRESENT      EXPECTED    Desmethylcitalopram is an expected metabolite of citalopram or the    enantiomeric form, escitalopram.    Mirtazapine                    PRESENT      EXPECTED   Acetaminophen                  PRESENT      EXPECTED  Drug Absent but Declared for Prescription Verification   Promethazine                   Not Detected UNEXPECTED ==================================================================== Test                      Result    Flag   Units  Ref Range   Creatinine              199              mg/dL      >=20 ==================================================================== Declared Medications:  The flagging and interpretation on this report are based on the  following declared medications.  Unexpected results may arise from  inaccuracies in the declared medications.   **Note: The testing scope of this panel includes these medications:   Escitalopram (Lexapro)  Fentanyl (Duragesic)  Mirtazapine (Remeron)  Promethazine (Phenergan)   **Note: The testing scope of this panel does not include small to  moderate amounts of these reported medications:   Acetaminophen (Tylenol)   **Note: The testing scope of this panel does not include the  following reported medications:   Bisoprolol  Dicyclomine (Bentyl)  Estradiol (Estrace)  Eye Drop  Fluticasone (Flonase)  Hydrochlorothiazide  Lubiprostone (Amitiza)  Meloxicam (Mobic)  Multivitamin (Centrum)  Oxybutynin (Ditropan)  Pantoprazole (Protonix)  Ropinirole (Requip)  Simethicone  Topical  Vitamin D3 ==================================================================== For clinical consultation, please call [phone]. ====================================================================      ROS  Constitutional: Denies any fever or chills Gastrointestinal: No reported hemesis, hematochezia, vomiting, or acute GI distress Musculoskeletal: Denies any acute onset joint swelling, redness, loss of ROM, or weakness Neurological: No reported episodes of acute onset apraxia, aphasia, dysarthria, agnosia, amnesia, paralysis, loss of coordination, or loss of consciousness  Medication Review  ARIPiprazole, Carboxymethylcellulose Sodium, Centrum Silver 50+Women, Lifitegrast, Menthol-Methyl Salicylate, Polyethyl Glycol-Propyl Glycol, Vitamin D3, acetaminophen, bisoprolol-hydrochlorothiazide, dicyclomine, escitalopram, estradiol, fluticasone, hydrOXYzine, lubiprostone, meloxicam, oxybutynin, pantoprazole, promethazine, rOPINIRole, simethicone, and traZODone  History Review  Allergy: [name] has No Known Allergies. Drug: [name]  reports no history of drug use. Alcohol:  reports no history of alcohol use. Tobacco:  reports that she has never smoked. She has never used smokeless tobacco. Social: [name]  reports that she has never smoked. She has never used smokeless tobacco. She reports that she does not drink alcohol and does not use drugs. 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 Surgical: [name]  has a past surgical history that includes  Spine surgery; Rotator cuff repair (2007); Dilation and curettage of uterus (1970); Colonoscopy with propofol (N/A, 05/03/2015); polypectomy (05/03/2015); Colonoscopy (05-03-15); Eye surgery (Right, 2017); and Endometrial biopsy. Family: family history includes Alzheimer's disease in her father; Arthritis in her mother; Cancer in her brother; Heart disease in her sister.  Laboratory Chemistry Profile   Renal Lab Results  Component Value Date   BUN 19 02/14/2020   CREATININE 1.12 (H) 02/14/2020   BCR 17 02/14/2020   GFRAA 50 (L) 02/14/2020   GFRNONAA 44 (L) 02/14/2020     Hepatic Lab Results  Component Value Date   AST 29 02/14/2020   ALBUMIN 4.9 (H) 02/14/2020   ALKPHOS 76 02/14/2020     Electrolytes Lab Results  Component Value Date   NA 144 02/14/2020   K 5.3 (H) 02/14/2020   CL 103 02/14/2020   CALCIUM 10.4 (H) 02/14/2020   MG 2.3 02/14/2020     Bone Lab Results  Component Value Date   VD25OH 57.1 08/10/2018  25OHVITD1 84 02/14/2020   25OHVITD2 <1.0 02/14/2020   25OHVITD3 84 02/14/2020     Inflammation (CRP: Acute Phase) (ESR: Chronic Phase) Lab Results  Component Value Date   CRP <0.5 10/26/2019   ESRSEDRATE 2 10/26/2019       Note: Above Lab results reviewed.  Recent Imaging Review  DG PAIN CLINIC C-ARM 1-60 MIN NO REPORT Fluoro was used, but no Radiologist interpretation will be provided.  Please refer to "NOTES" tab for provider progress note. Note: Reviewed          Physical Exam  General appearance: Well nourished, well developed, and well hydrated. In no apparent acute distress Mental status: Alert, oriented x 3 (person, place, & time)       Respiratory: No evidence of acute respiratory distress Eyes: PERLA Vitals: BP (!) 145/70   Pulse (!) 57   Temp (!) 97.3 F (36.3 C)   Ht 5' 5" (1.651 m)   Wt 157 lb (71.2 kg)   SpO2 98%   BMI 26.13 kg/m  BMI: Estimated body mass index is 26.13 kg/m as calculated from the following:   Height as of  this encounter: 5' 5" (1.651 m).   Weight as of this encounter: 157 lb (71.2 kg). Ideal: Ideal body weight: 57 kg (125 lb 10.6 oz) Adjusted ideal body weight: 62.7 kg (138 lb 3.2 oz)  Assessment   Status Diagnosis  Controlled Controlled Controlled 1. Chronic pain syndrome   2. Chronic low back pain (Midline) w/o sciatica   3. Kissing spine syndrome   4. 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 Spinal enthesopathy of lumbar region ([place])   6. Chronic hip pain (2ry area of Pain) (Bilateral) (R>L)   7. Failed back surgical syndrome   8. 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   Acute Pain of Left Shoulder    Plan of Care  Problem-specific:  No problem-specific Assessment & Plan notes found for this encounter.  [name] has a current medication list which includes the following long-term medication(s): bisoprolol-hydrochlorothiazide, dicyclomine, escitalopram, fluticasone, pantoprazole, ropinirole, and trazodone.  Pharmacotherapy (Medications Ordered): No orders of the defined types were placed in this encounter.  Orders:  Orders Placed This Encounter  Procedures  . SHOULDER INJECTION    Standing Status:   Future    Standing Expiration Date:   07/30/2020    Scheduling Instructions:     Procedure: Intra-articular shoulder (Glenohumeral) joint injection     Side: Left-sided     Level: Glenohumeral joint               Sedation: Patient's choice.     Timeframe: As permitted by the schedule    Order Specific Question:   Where will this procedure be performed?    Answer:   [facility]   Follow-up plan:   Return for Procedure (no sedation): (L) Shoulder  inj. #1.      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)   Considering:   Diagnostic bilateral lumbar facet block  Diagnostic bilateral SI joint block  Possible diagnostic bilateral femoral nerve and  obturator nerve block  Diagnostic midline L3-4 LESI  Diagnostic midline caudal ESI    PRN Procedures:   Palliative bilateral IA hip joint injection #2 (100/100/90/100)      Recent Visits Date Type Provider Dept  05/16/20 Procedure visit Milinda Pointer, MD [facility]  05/06/20 Office Visit Milinda Pointer, MD [facility]  03/19/20 Procedure visit Milinda Pointer, MD [facility]  Showing recent visits within past 90 days and meeting all other requirements Today's Visits Date Type Provider Dept  05/30/20 Procedure visit Milinda Pointer, MD [facility]  Showing today's visits and meeting all other requirements Future Appointments Date Type Provider Dept  06/04/20 Appointment Milinda Pointer, MD [facility]  Showing future appointments within next 90 days and meeting all other requirements  I discussed the assessment and treatment plan with the patient. The patient was provided an opportunity to ask questions and all were answered. The patient agreed with the plan and demonstrated an understanding of the instructions.  Patient advised to call back or seek an in-person evaluation if the symptoms or condition worsens.  Duration of encounter: 54 minutes.  Note by: Gaspar Cola, MD Date: 05/30/2020; Time: 1:58 PM

## 2020-05-30 NOTE — Patient Instructions (Signed)
____________________________________________________________________________________________  Preparing for your procedure (without sedation)  Procedure appointments are limited to planned procedures: . No Prescription Refills. . No disability issues will be discussed. . No medication changes will be discussed.  Instructions: . Oral Intake: Do not eat or drink anything for at least 6 hours prior to your procedure. (Exception: Blood Pressure Medication. See below.) . Transportation: Unless otherwise stated by your physician, you may drive yourself after the procedure. . Blood Pressure Medicine: Do not forget to take your blood pressure medicine with a sip of water the morning of the procedure. If your Diastolic (lower reading)is above 100 mmHg, elective cases will be cancelled/rescheduled. . Blood thinners: These will need to be stopped for procedures. Notify our staff if you are taking any blood thinners. Depending on which one you take, there will be specific instructions on how and when to stop it. . Diabetics on insulin: Notify the staff so that you can be scheduled 1st case in the morning. If your diabetes requires high dose insulin, take only  of your normal insulin dose the morning of the procedure and notify the staff that you have done so. . Preventing infections: Shower with an antibacterial soap the morning of your procedure.  . Build-up your immune system: Take 1000 mg of Vitamin C with every meal (3 times a day) the day prior to your procedure. . Antibiotics: Inform the staff if you have a condition or reason that requires you to take antibiotics before dental procedures. . Pregnancy: If you are pregnant, call and cancel the procedure. . Sickness: If you have a cold, fever, or any active infections, call and cancel the procedure. . Arrival: You must be in the facility at least 30 minutes prior to your scheduled procedure. . Children: Do not bring any children with you. . Dress  appropriately: Bring dark clothing that you would not mind if they get stained. . Valuables: Do not bring any jewelry or valuables.  Reasons to call and reschedule or cancel your procedure: (Following these recommendations will minimize the risk of a serious complication.) . Surgeries: Avoid having procedures within 2 weeks of any surgery. (Avoid for 2 weeks before or after any surgery). . Flu Shots: Avoid having procedures within 2 weeks of a flu shots or . (Avoid for 2 weeks before or after immunizations). . Barium: Avoid having a procedure within 7-10 days after having had a radiological study involving the use of radiological contrast. (Myelograms, Barium swallow or enema study). . Heart attacks: Avoid any elective procedures or surgeries for the initial 6 months after a "Myocardial Infarction" (Heart Attack). . Blood thinners: It is imperative that you stop these medications before procedures. Let us know if you if you take any blood thinner.  . Infection: Avoid procedures during or within two weeks of an infection (including chest colds or gastrointestinal problems). Symptoms associated with infections include: Localized redness, fever, chills, night sweats or profuse sweating, burning sensation when voiding, cough, congestion, stuffiness, runny nose, sore throat, diarrhea, nausea, vomiting, cold or Flu symptoms, recent or current infections. It is specially important if the infection is over the area that we intend to treat. . Heart and lung problems: Symptoms that may suggest an active cardiopulmonary problem include: cough, chest pain, breathing difficulties or shortness of breath, dizziness, ankle swelling, uncontrolled high or unusually low blood pressure, and/or palpitations. If you are experiencing any of these symptoms, cancel your procedure and contact your primary care physician for an evaluation.  Remember:  Regular   Business hours are:  Monday to Thursday 8:00 AM to 4:00  PM  Provider's Schedule: Sadaf Przybysz, MD:  Procedure days: Tuesday and Thursday 7:30 AM to 4:00 PM  Bilal Lateef, MD:  Procedure days: Monday and Wednesday 7:30 AM to 4:00 PM ____________________________________________________________________________________________    

## 2020-06-03 DIAGNOSIS — G8929 Other chronic pain: Secondary | ICD-10-CM | POA: Insufficient documentation

## 2020-06-03 DIAGNOSIS — M25512 Pain in left shoulder: Secondary | ICD-10-CM | POA: Insufficient documentation

## 2020-06-03 DIAGNOSIS — M19012 Primary osteoarthritis, left shoulder: Secondary | ICD-10-CM | POA: Insufficient documentation

## 2020-06-03 NOTE — Progress Notes (Signed)
PROVIDER NOTE: Information contained herein reflects review and annotations entered in association with encounter. Interpretation of such information and data should be left to medically-trained personnel. Information provided to patient can be located elsewhere in the medical record under "Patient Instructions". Document created using STT-dictation technology, any transcriptional errors that may result from process are unintentional.    Patient: [name]  Service Category: Procedure  Provider: Gaspar Cola, MD  DOB: 1929-06-01  DOS: 06/04/2020  Location: [facility]  MRN: [id number]  Setting: Ambulatory - outpatient  Referring Provider: Lavera Guise, MD  Type: Established Patient  Specialty: Interventional Pain Management  PCP: Lavera Guise, MD   Primary Reason for Visit: Interventional Pain Management Treatment. CC: Shoulder Pain  Procedure:          Anesthesia, Analgesia, Anxiolysis:  Type: Diagnostic Glenohumeral and acromioclavicular joint Injection #1  Primary Purpose: Diagnostic Region: Superior Shoulder Area Level:  Shoulder Target Area: Glenohumeral and acromioclavicular joint Approach: Anterior approach. Laterality: Left  Type: Local Anesthesia Indication(s): Analgesia         Route: Infiltration (Shoals/IM) IV Access: Declined Sedation: Declined  Local Anesthetic: Lidocaine 1-2%  Position: Supine   Indications: 1. Chronic shoulder pain (Left)   2. Arthralgia of shoulder region (Left)   3. Osteoarthritis of shoulder (Left)    Pain Score: Pre-procedure: 10-Worst pain ever/10 Post-procedure: 0-No pain/10   Pre-op H&P Assessment:  Ms. Felix is a 86 y.o. (year old), female patient, seen today for interventional treatment. She  has a past surgical history that includes Spine surgery; Rotator cuff repair (2007); Dilation and curettage of uterus (1970); Colonoscopy with propofol (N/A, 05/03/2015); polypectomy (05/03/2015); Colonoscopy  (05-03-15); Eye surgery (Right, 2017); and Endometrial biopsy. Ms. Riga has a current medication list which includes the following prescription(s): acetaminophen, aripiprazole, bisoprolol-hydrochlorothiazide, carboxymethylcellulose sodium, vitamin d3, dicyclomine, escitalopram, estradiol, fluticasone, hydroxyzine, menthol-methyl salicylate, lubiprostone, meloxicam, centrum silver 50+women, oxybutynin, pantoprazole, systane, promethazine, ropinirole, simethicone, trazodone, and xiidra. Her primarily concern today is the Shoulder Pain  Initial Vital Signs:  Pulse/HCG Rate: (!) 50ECG Heart Rate: (!) 54 Temp: (!) 97.3 F (36.3 C) Resp: 17 BP: (!) 167/67 SpO2: 98 %  BMI: Estimated body mass index is 26.95 kg/m as calculated from the following:   Height as of this encounter: 5\' 4"  (1.626 m).   Weight as of this encounter: 157 lb (71.2 kg).  Risk Assessment: Allergies: Reviewed. She has No Known Allergies.  Allergy Precautions: None required Coagulopathies: Reviewed. None identified.  Blood-thinner therapy: None at this time Active Infection(s): Reviewed. None identified. Ms. Tays is afebrile  Site Confirmation: Ms. Blankenbaker was asked to confirm the procedure and laterality before marking the site Procedure checklist: Completed Consent: Before the procedure and under the influence of no sedative(s), amnesic(s), or anxiolytics, the patient was informed of the treatment options, risks and possible complications. To fulfill our ethical and legal obligations, as recommended by the American Medical Association's Code of Ethics, I have informed the patient of my clinical impression; the nature and purpose of the treatment or procedure; the risks, benefits, and possible complications of the intervention; the alternatives, including doing nothing; the risk(s) and benefit(s) of the alternative treatment(s) or procedure(s); and the risk(s) and benefit(s) of doing nothing. The patient was  provided information about the general risks and possible complications associated with the procedure. These may include, but are not limited to: failure to achieve desired goals, infection, bleeding, organ or nerve damage, allergic reactions, paralysis, and death. In addition, the  patient was informed of those risks and complications associated to the procedure, such as failure to decrease pain; infection; bleeding; organ or nerve damage with subsequent damage to sensory, motor, and/or autonomic systems, resulting in permanent pain, numbness, and/or weakness of one or several areas of the body; allergic reactions; (i.e.: anaphylactic reaction); and/or death. Furthermore, the patient was informed of those risks and complications associated with the medications. These include, but are not limited to: allergic reactions (i.e.: anaphylactic or anaphylactoid reaction(s)); adrenal axis suppression; blood sugar elevation that in diabetics may result in ketoacidosis or comma; water retention that in patients with history of congestive heart failure may result in shortness of breath, pulmonary edema, and decompensation with resultant heart failure; weight gain; swelling or edema; medication-induced neural toxicity; particulate matter embolism and blood vessel occlusion with resultant organ, and/or nervous system infarction; and/or aseptic necrosis of one or more joints. Finally, the patient was informed that Medicine is not an exact science; therefore, there is also the possibility of unforeseen or unpredictable risks and/or possible complications that may result in a catastrophic outcome. The patient indicated having understood very clearly. We have given the patient no guarantees and we have made no promises. Enough time was given to the patient to ask questions, all of which were answered to the patient's satisfaction. Ms. Irigoyen has indicated that she wanted to continue with the procedure. Attestation: I, the  ordering provider, attest that I have discussed with the patient the benefits, risks, side-effects, alternatives, likelihood of achieving goals, and potential problems during recovery for the procedure that I have provided informed consent. Date  Time: 06/04/2020 10:31 AM  Pre-Procedure Preparation:  Monitoring: As per clinic protocol. Respiration, ETCO2, SpO2, BP, heart rate and rhythm monitor placed and checked for adequate function Safety Precautions: Patient was assessed for positional comfort and pressure points before starting the procedure. Time-out: I initiated and conducted the "Time-out" before starting the procedure, as per protocol. The patient was asked to participate by confirming the accuracy of the "Time Out" information. Verification of the correct person, site, and procedure were performed and confirmed by me, the nursing staff, and the patient. "Time-out" conducted as per Joint Commission's Universal Protocol (UP.01.01.01). Time: 1057  Description of Procedure:          Area Prepped: Entire shoulder Area DuraPrep (Iodine Povacrylex [0.7% available iodine] and Isopropyl Alcohol, 74% w/w) Safety Precautions: Aspiration looking for blood return was conducted prior to all injections. At no point did we inject any substances, as a needle was being advanced. No attempts were made at seeking any paresthesias. Safe injection practices and needle disposal techniques used. Medications properly checked for expiration dates. SDV (single dose vial) medications used. Description of the Procedure: Protocol guidelines were followed. The patient was placed in position over the procedure table. The target area was identified and the area prepped in the usual manner. Skin & deeper tissues infiltrated with local anesthetic. Appropriate amount of time allowed to pass for local anesthetics to take effect. The procedure needles were then advanced to the target area. Proper needle placement secured.  Negative aspiration confirmed. Solution injected in intermittent fashion, asking for systemic symptoms every 0.5cc of injectate. The needles were then removed and the area cleansed, making sure to leave some of the prepping solution back to take advantage of its long term bactericidal properties.         Vitals:   06/04/20 1050 06/04/20 1054 06/04/20 1059 06/04/20 1103  BP: (!) 182/72 (!) 183/63 Marland Kitchen)  197/74 (!) 185/77  Pulse:      Resp: 18 19 17 16   Temp:      SpO2: 99% 98% 98% 99%  Weight:      Height:        Start Time: 1057 hrs. End Time: 1102 hrs. Materials:  Needle(s) Type: Spinal Needle Gauge: 22G Length: 3.5-in Medication(s): Please see orders for medications and dosing details.  Imaging Guidance (Non-Spinal):          Type of Imaging Technique: Fluoroscopy Guidance (Non-Spinal) Indication(s): Assistance in needle guidance and placement for procedures requiring needle placement in or near specific anatomical locations not easily accessible without such assistance. Exposure Time: Please see nurses notes. Contrast: None used. Fluoroscopic Guidance: I was personally present during the use of fluoroscopy. "Tunnel Vision Technique" used to obtain the best possible view of the target area. Parallax error corrected before commencing the procedure. "Direction-depth-direction" technique used to introduce the needle under continuous pulsed fluoroscopy. Once target was reached, antero-posterior, oblique, and lateral fluoroscopic projection used confirm needle placement in all planes. Images permanently stored in EMR. Interpretation: No contrast injected. I personally interpreted the imaging intraoperatively. Adequate needle placement confirmed in multiple planes. Permanent images saved into the patient's record.  Antibiotic Prophylaxis:   Anti-infectives (From admission, onward)   None     Indication(s): None identified  Post-operative Assessment:  Post-procedure Vital Signs:    Pulse/HCG Rate: (!) 50(!) 52 Temp: (!) 97.3 F (36.3 C) Resp: 16 BP: (!) 185/77 SpO2: 99 %  EBL: None  Complications: No immediate post-treatment complications observed by team, or reported by patient.  Note: The patient tolerated the entire procedure well. A repeat set of vitals were taken after the procedure and the patient was kept under observation following institutional policy, for this type of procedure. Post-procedural neurological assessment was performed, showing return to baseline, prior to discharge. The patient was provided with post-procedure discharge instructions, including a section on how to identify potential problems. Should any problems arise concerning this procedure, the patient was given instructions to immediately contact us, at any time, without hesitation. In any case, we plan to contact the patient by telephone for a follow-up status report regarding this interventional procedure.  Comments:  No additional relevant information.  Plan of Care  Orders:  Orders Placed This Encounter  Procedures  . SHOULDER INJECTION    Scheduling Instructions:     Procedure: Intra-articular shoulder (Glenohumeral) joint and (AC) Acromioclavicular joint injection     Side: Left-sided     Level: Glenohumeral joint and (AC) Acromioclavicular joint     Sedation: Patient's choice.     Timeframe: Today    Order Specific Question:   Where will this procedure be performed?    Answer:   [facility]  . DG Shoulder Left    Standing Status:   Future    Standing Expiration Date:   07/03/2020    Scheduling Instructions:     Imaging must be done as soon as possible. Inform patient that order will expire within 30 days and I will not renew it.    Order Specific Question:   Reason for Exam (SYMPTOM  OR DIAGNOSIS REQUIRED)    Answer:   Left shoulder pain    Order Specific Question:   Preferred imaging location?    Answer:    [facility]    Order Specific Question:   Call  Results- Best Contact Number?    Answer:   [phone] [facility])  Order Specific Question:   Release to patient    Answer:   Immediate  . DG PAIN [facility]IN[facility] C-ARM 1-60 MIN NO REPORT    Intraoperative interpretation by procedural physician at [facility].    Standing Status:   Standing    Number of Occurrences:   1    Order Specific Question:   Reason for exam:    Answer:   Assistance in needle guidance and placement for procedures requiring needle placement in or near specific anatomical locations not easily accessible without such assistance.  . Informed Consent Details: Physician/Practitioner Attestation; Transcribe to consent form and obtain patient signature    Note: Always confirm laterality of pain with Ms. Caba, before procedure.    Order Specific Question:   Physician/Practitioner attestation of informed consent for procedure/surgical case    Answer:   I, the physician/practitioner, attest that I have discussed with the patient the benefits, risks, side effects, alternatives, likelihood of achieving goals and potential problems during recovery for the procedure that I have provided informed consent.    Order Specific Question:   Procedure    Answer:   Intra-articular shoulder joint injection under fluoroscopic guidance    Order Specific Question:   Physician/Practitioner performing the procedure    Answer:   Javel Hersh A. Dossie Arbour, MD    Order Specific Question:   Indication/Reason    Answer:   Chronic shoulder pain secondary to shoulder arthropathy  . Provide equipment / supplies at bedside    "Block Tray" (Disposable  single use) Needle type: SpinalSpinal Amount/quantity: 1 Size: Regular (3.5-inch) Gauge: 22G    Standing Status:   Standing    Number of Occurrences:   1    Order Specific Question:   Specify    Answer:   Block Tray   Chronic Opioid Analgesic:  No opioid analgesics prescribed by our practice.  She used to get a fentanyl 12.5 mcg/h patch  from [place], but we discontinued it due to improvement of pain and cognitive impairment secondary to the medication. Highest recorded MME/day: 28.8 mg/day MME/day: 0 mg/day   Medications ordered for procedure: Meds ordered this encounter  Medications  . lidocaine (XYLOCAINE) 2 % (with pres) injection 400 mg  . methylPREDNISolone acetate (DEPO-MEDROL) injection 80 mg  . ropivacaine (PF) 2 mg/mL (0.2%) (NAROPIN) injection 9 mL   Medications administered: We administered lidocaine, methylPREDNISolone acetate, and ropivacaine (PF) 2 mg/mL (0.2%).  See the medical record for exact dosing, route, and time of administration.  Follow-up plan:   Return in about 2 weeks (around 06/18/2020) for (VIrtual), (PP) Follow-up.       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)   Considering:   Diagnostic bilateral lumbar facet block  Diagnostic bilateral SI joint block  Possible diagnostic bilateral femoral nerve and obturator nerve block  Diagnostic midline L3-4 LESI  Diagnostic midline caudal ESI    PRN Procedures:   Palliative bilateral IA hip joint injection #2 (100/100/90/100)       Recent Visits Date Type Provider Dept  05/30/20 Procedure visit Milinda Pointer, MD [facility]  05/16/20 Procedure visit Milinda Pointer, MD [facility]  05/06/20 Office Visit Milinda Pointer, MD [facility]  03/19/20 Procedure visit Milinda Pointer, MD [facility]  Showing recent visits within past 90 days and meeting all other requirements Today's Visits Date Type Provider Dept  06/04/20 Procedure visit Milinda Pointer, MD  [facility]  Showing today's visits and meeting all other requirements Future Appointments Date Type Provider Dept  06/18/20 Appointment Milinda Pointer, MD [facility]  Showing future  appointments within next 90 days and meeting all other requirements  Disposition: Discharge home  Discharge (Date  Time): 06/04/2020; 1111 hrs.   Primary Care Physician: Lavera Guise, MD Location: [facility] Note by: Gaspar Cola, MD Date: 06/04/2020; Time: 11:42 AM  Disclaimer:  Medicine is not an Chief Strategy Officer. The only guarantee in medicine is that nothing is guaranteed. It is important to note that the decision to proceed with this intervention was based on the information collected from the patient. The Data and conclusions were drawn from the patient's questionnaire, the interview, and the physical examination. Because the information was provided in large part by the patient, it cannot be guaranteed that it has not been purposely or unconsciously manipulated. Every effort has been made to obtain as much relevant data as possible for this evaluation. It is important to note that the conclusions that lead to this procedure are derived in large part from the available data. Always take into account that the treatment will also be dependent on availability of resources and existing treatment guidelines, considered by other Pain Management Practitioners as being common knowledge and practice, at the time of the intervention. For Medico-Legal purposes, it is also important to point out that variation in procedural techniques and pharmacological choices are the acceptable norm. The indications, contraindications, technique, and results of the above procedure should only be interpreted and judged by a Board-Certified Interventional Pain Specialist with extensive familiarity and expertise in the same exact procedure and technique.

## 2020-06-04 ENCOUNTER — Ambulatory Visit
Admission: RE | Admit: 2020-06-04 | Discharge: 2020-06-04 | Disposition: A | Discharge status: Home / Self Care | Payer: Medicare HMO | Source: Ambulatory Visit | Attending: Pain Medicine | Admitting: Pain Medicine

## 2020-06-04 ENCOUNTER — Other Ambulatory Visit: Payer: Self-pay

## 2020-06-04 ENCOUNTER — Encounter: Payer: Self-pay | Admitting: Pain Medicine

## 2020-06-04 ENCOUNTER — Ambulatory Visit: Payer: Medicare HMO | Admitting: Pain Medicine

## 2020-06-04 ENCOUNTER — Ambulatory Visit (HOSPITAL_BASED_OUTPATIENT_CLINIC_OR_DEPARTMENT_OTHER): Payer: Medicare HMO | Admitting: Pain Medicine

## 2020-06-04 VITALS — BP 185/77 | HR 50 | Temp 97.3°F | Resp 16 | Ht 64.0 in | Wt 157.0 lb

## 2020-06-04 DIAGNOSIS — M25512 Pain in left shoulder: Secondary | ICD-10-CM | POA: Insufficient documentation

## 2020-06-04 DIAGNOSIS — G8929 Other chronic pain: Secondary | ICD-10-CM | POA: Diagnosis not present

## 2020-06-04 DIAGNOSIS — M19012 Primary osteoarthritis, left shoulder: Secondary | ICD-10-CM

## 2020-06-04 MED ORDER — ROPIVACAINE HCL 2 MG/ML IJ SOLN
9.0000 mL | Freq: Once | INTRAMUSCULAR | Status: AC
  Administered 2020-06-04: 9 mL via INTRA_ARTICULAR
  Filled 2020-06-04: qty 10

## 2020-06-04 MED ORDER — METHYLPREDNISOLONE ACETATE 80 MG/ML IJ SUSP
80.0000 mg | Freq: Once | INTRAMUSCULAR | Status: AC
  Administered 2020-06-04: 80 mg via INTRA_ARTICULAR
  Filled 2020-06-04: qty 1

## 2020-06-04 MED ORDER — LIDOCAINE HCL 2 % IJ SOLN
20.0000 mL | Freq: Once | INTRAMUSCULAR | Status: AC
  Administered 2020-06-04: 400 mg
  Filled 2020-06-04: qty 40

## 2020-06-04 NOTE — Progress Notes (Signed)
Safety precautions to be maintained throughout the outpatient stay will include: orient to surroundings, keep bed in low position, maintain call bell within reach at all times, provide assistance with transfer out of bed and ambulation.  

## 2020-06-04 NOTE — Patient Instructions (Signed)

## 2020-06-05 ENCOUNTER — Telehealth: Payer: Self-pay

## 2020-06-05 NOTE — Telephone Encounter (Signed)
Post procedure phone call.  Pt states she is doing ok.

## 2020-06-10 ENCOUNTER — Encounter: Payer: Self-pay | Admitting: Nurse Practitioner

## 2020-06-10 ENCOUNTER — Ambulatory Visit (INDEPENDENT_AMBULATORY_CARE_PROVIDER_SITE_OTHER): Payer: Medicare HMO | Admitting: Nurse Practitioner

## 2020-06-10 ENCOUNTER — Other Ambulatory Visit: Payer: Self-pay

## 2020-06-10 VITALS — BP 147/68 | HR 70 | Temp 98.3°F | Resp 16 | Ht 64.0 in | Wt 146.4 lb

## 2020-06-10 DIAGNOSIS — M4606 Spinal enthesopathy, lumbar region: Secondary | ICD-10-CM | POA: Diagnosis not present

## 2020-06-10 DIAGNOSIS — F3341 Major depressive disorder, recurrent, in partial remission: Secondary | ICD-10-CM | POA: Diagnosis not present

## 2020-06-10 DIAGNOSIS — R69 Illness, unspecified: Secondary | ICD-10-CM | POA: Diagnosis not present

## 2020-06-10 DIAGNOSIS — Z9989 Dependence on other enabling machines and devices: Secondary | ICD-10-CM

## 2020-06-10 DIAGNOSIS — G4733 Obstructive sleep apnea (adult) (pediatric): Secondary | ICD-10-CM

## 2020-06-10 DIAGNOSIS — I10 Essential (primary) hypertension: Secondary | ICD-10-CM

## 2020-06-10 NOTE — Progress Notes (Signed)
[facility] [place], [place] [address]  Internal MEDICINE  Office Visit Note  Patient Name: Kristen Pennington  [id number]  [id number]  Date of Service: 07/13/2020  Chief Complaint  Patient presents with  . Follow-up  . Depression  . Gastroesophageal Reflux  . Hyperlipidemia  . Hypertension  . Quality Metric Gaps    flu  . controlled substance form    reviewed with PT  . Dizziness    several days  . Fall    3 weeks ago  . Arm Pain  . Hand Pain    The patient is here for follow up visit. She had fall a few weeks ago. She hurt her shoulder, hip, and a finger. She had x-rays done and nothing broken. She is seeing Dr. Caesar Chestnut. She is getting injections into her hips and low back. She is no longer on fentanyl. Her pain is better controlled. Will see him again in early December. She continues to see psychiatry. Her anxiety medication was increased to three times daily. She is cutting the medication in half as the full dose was "too much."  Blood pressure has been off and on elevated. Today, blood pressure is doing better than recent visits with other providers.       Current Medication: Outpatient Encounter Medications as of 06/10/2020  Medication Sig  . acetaminophen (TYLENOL) 500 MG tablet Take 500 mg by mouth every 6 (six) hours as needed.  . bisoprolol-hydrochlorothiazide (ZIAC) 5-6.25 MG tablet Take 1 tablet by mouth daily.  . Carboxymethylcellulose Sodium (THERATEARS OP) Apply to eye.  . Cholecalciferol (VITAMIN D3) 5000 units TABS Take 5,000 Units by mouth daily.  Marland Kitchen dicyclomine (BENTYL) 10 MG capsule Take 10 mg by mouth 4 (four) times daily -  before meals and at bedtime.  Marland Kitchen escitalopram (LEXAPRO) 20 MG tablet Take 1 tablet (20 mg total) by mouth daily.  . hydrOXYzine (ATARAX/VISTARIL) 10 MG tablet Take 1 tablet (10 mg total) by mouth 3 (three) times daily as needed for anxiety. For severe anxiety attacks only (Patient taking differently: Take 5 mg  by mouth 3 (three) times daily as needed for anxiety. For severe anxiety attacks only)  . Liniments (SALONPAS PAIN RELIEF PATCH EX) Apply 1 patch topically 2 (two) times daily.  Marland Kitchen lubiprostone (AMITIZA) 8 MCG capsule Take 1 capsule po bid prn ibs constipation.  . Multiple Vitamins-Minerals (CENTRUM SILVER 50+WOMEN) TABS Take by mouth.  Vladimir Faster Glycol-Propyl Glycol (SYSTANE) 0.4-0.3 % SOLN Apply to eye.  . promethazine (PHENERGAN) 25 MG tablet Take 25 mg by mouth as needed.   Marland Kitchen rOPINIRole (REQUIP) 1 MG tablet TAKE ONE TABLET 3 TIMES DAILY AS NEEDED FOR RESTLESS LEGS  . simethicone (MYLICON) 80 MG chewable tablet Chew 80 mg by mouth every 6 (six) hours as needed for flatulence.  Marland Kitchen XIIDRA 5 % SOLN Apply to eye. Sample given by eye dr  . [DISCONTINUED] ARIPiprazole (ABILIFY) 2 MG tablet Take 2 mg by mouth daily.  . [DISCONTINUED] estradiol (ESTRACE) 0.1 MG/GM vaginal cream Use once a week as needed  . [DISCONTINUED] fluticasone (FLONASE) 50 MCG/ACT nasal spray 1 SPRAY IN EACH NOSTRIL ONCE ADAY AS NEEDED  . [DISCONTINUED] meloxicam (MOBIC) 7.5 MG tablet Take 1 tablet (7.5 mg total) by mouth daily.  . [DISCONTINUED] oxybutynin (DITROPAN-XL) 10 MG 24 hr tablet Take 1 tablet (10 mg total) by mouth daily.  . [DISCONTINUED] pantoprazole (PROTONIX) 40 MG tablet Take 1 tablet (40 mg total) by mouth daily.  . [DISCONTINUED]  traZODone (DESYREL) 50 MG tablet TAKE 1/2 TO 1 TABLET BY MOUTH AT BEDTIMEAS DIRECTED, STOP TAKING REMERON (MIRTAZAPINE) (Patient not taking: Reported on 07/01/2020)   No facility-administered encounter medications on file as of 06/10/2020.    Surgical History: Past Surgical History:  Procedure Laterality Date  . COLONOSCOPY  05-03-15  . COLONOSCOPY WITH PROPOFOL N/A 05/03/2015   Procedure: COLONOSCOPY WITH PROPOFOL;  Surgeon: Lucilla Lame, MD;  Location: [facility];  Service: Endoscopy;  Laterality: N/A;  CPAP  . DILATION AND CURETTAGE OF UTERUS  1970  . ENDOMETRIAL  BIOPSY    . EYE SURGERY Right 2017  . POLYPECTOMY  05/03/2015   Procedure: POLYPECTOMY;  Surgeon: Lucilla Lame, MD;  Location: [facility];  Service: Endoscopy;;  . ROTATOR CUFF REPAIR  2007  . SPINE SURGERY      Medical History: Past Medical History:  Diagnosis Date  . Arthritis   . Atrophic vaginitis 03/06/2013   Last Assessment & Plan:  She will plan to continue estradiol vaginal cream.  Prescription was rewritten stipulating use of the generic product.   . Atypical migraine 03/06/2013   Last Assessment & Plan:  Since she rarely takes sumatriptan, we will discontinue it. Continue sparing use of OTC migraine products.   . Avitaminosis D 03/06/2013   Last Assessment & Plan:  Recheck vitamin D level. Plan to continue vitamin D supplementation.   . Ceratitis 08/01/2013   Last Assessment & Plan:  Because of the expense, she will discontinue Restasis. She will use over-the-counter moisturizing eyedrops and liquid gel.   . Colon polyp   . 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   . 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   . Cystocele, midline 03/06/2013  . Demoralization and apathy 03/06/2013   Last Assessment & Plan:  Since she has been taking bupropion only once a day, I will write the prescription with the correct instructions and correct quantity. She has done well on this for years and she is encouraged to take it regularly   . Depression   . Environmental allergies   . Epistaxis   . Essential (primary) hypertension 05/17/2013   Last Assessment & Plan:  Her blood pressure is well-controlled. We will plan to continue hydrochlorothiazide and benazepril.  Both are generic and should be affordable on her health plan.   Marland Kitchen GERD (gastroesophageal reflux disease)   . Hemorrhoid   . Inflammation of  sacroiliac joint ([place]) 03/06/2013   Last Assessment & Plan:  She is doing well on her present regimen of fentanyl patch supplemented with sparing use of tramadol/acetaminophen. We will continue that regimen.   . Sinusitis   . Sleep apnea    CPAP    Family History: Family History  Problem Relation Age of Onset  . Arthritis Mother   . Alzheimer's disease Father   . Heart disease Sister   . Cancer Brother   . Ovarian cancer Neg Hx   . Breast cancer Neg Hx   . Colon cancer Neg Hx   . Diabetes Neg Hx     Social History   Socioeconomic History  . Marital status: Widowed    Spouse name: Not on file  . Number of children: 2  . Years of education: Not on file  . Highest education level: Some college, no degree  Occupational History  . Not on file  Tobacco Use  . Smoking status: Never Smoker  . Smokeless tobacco: Never Used  Vaping Use  . Vaping Use: Never used  Substance and Sexual Activity  . Alcohol use: No    Alcohol/week: 0.0 standard drinks  . Drug use: No  . Sexual activity: Not Currently  Other Topics Concern  . Not on file  Social History Narrative  . Not on file   Social Determinants of Health   Financial Resource Strain: Not on file  Food Insecurity: Not on file  Transportation Needs: Not on file  Physical Activity: Not on file  Stress: Not on file  Social Connections: Not on file  Intimate Partner Violence: Not on file      Review of Systems  Constitutional: Positive for activity change and fatigue. Negative for chills and unexpected weight change.       Activity levels improving since her last visit   HENT: Negative for congestion, postnasal drip, rhinorrhea, sinus pressure, sinus pain, sneezing and sore throat.   Respiratory: Negative for cough, chest tightness, shortness of breath and wheezing.   Cardiovascular: Negative for chest pain and palpitations.  Gastrointestinal: Negative for abdominal pain, constipation, diarrhea, nausea and vomiting.   Endocrine: Negative for cold intolerance, heat intolerance, polydipsia and polyuria.  Musculoskeletal: Positive for arthralgias, back pain, gait problem and myalgias. Negative for joint swelling and neck pain.       Improved lower back pain. Patient now seeing pain management provider.   Skin: Negative for rash.  Neurological: Positive for weakness and headaches. Negative for tremors and numbness.  Hematological: Negative for adenopathy. Does not bruise/bleed easily.  Psychiatric/Behavioral: Negative for behavioral problems (Depression), sleep disturbance and suicidal ideas. The patient is nervous/anxious.        Patient is routinely seeing psychiatry.    Today's Vitals   06/10/20 1546  BP: (!) 147/68  Pulse: 70  Resp: 16  Temp: 98.3 F (36.8 C)  SpO2: 95%  Weight: 146 lb 6.4 oz (66.4 kg)  Height: 5\' 4"  (1.626 m)   Body mass index is 25.13 kg/m.  Physical Exam Vitals and nursing note reviewed.  Constitutional:      General: She is not in acute distress.    Appearance: Normal appearance. She is well-developed. She is not diaphoretic.  HENT:     Head: Normocephalic and atraumatic.     Nose: Nose normal.     Mouth/Throat:     Pharynx: No oropharyngeal exudate.  Eyes:     Pupils: Pupils are equal, round, and reactive to light.  Neck:     Thyroid: No thyromegaly.     Vascular: No carotid bruit or JVD.     Trachea: No tracheal deviation.  Cardiovascular:     Rate and Rhythm: Normal rate and regular rhythm.     Heart sounds: Normal heart sounds. No murmur heard. No friction rub. No gallop.   Pulmonary:     Effort: Pulmonary effort is normal. No respiratory distress.     Breath sounds: Normal breath sounds. No wheezing or rales.  Chest:     Chest wall: No tenderness.  Abdominal:     Palpations: Abdomen is soft.  Musculoskeletal:        General: Normal range of motion.     Cervical back: Normal range of motion and neck supple.     Comments: Improved lower back pain.  Still having trouble with bending and twisting at the waist. Sitting or standing  for long periods of time cause increased pain. She does use cane to help her with ambulation.   Lymphadenopathy:     Cervical: No cervical adenopathy.  Skin:    General: Skin is warm and dry.  Neurological:     Mental Status: She is alert and oriented to person, place, and time. Mental status is at baseline.     Cranial Nerves: No cranial nerve deficit.  Psychiatric:        Attention and Perception: Attention and perception normal.        Mood and Affect: Mood is anxious.        Speech: Speech normal.        Behavior: Behavior normal. Behavior is cooperative.        Thought Content: Thought content normal.        Cognition and Memory: Cognition and memory normal.        Judgment: Judgment normal.    Assessment/Plan: 1. Essential (primary) hypertension Stable. Continue bp medication as prescribed   2. Spinal enthesopathy of lumbar region [facility]) Patient is off fentanyl patches. She is now seeing pain management provider. Follow up closely.   3. MDD (major depressive disorder), recurrent, in partial remission ([place]) Continue regular visits with psychiatry as scheduled.   4. OSA on CPAP Continue routine visits with Dr. Devona Konig for CPAP management.   General Counseling: gaylia kassel understanding of the findings of todays visit and agrees with plan of treatment. I have discussed any further diagnostic evaluation that may be needed or ordered today. We also reviewed her medications today. she has been encouraged to call the office with any questions or concerns that should arise related to todays visit.  This patient was seen by Leretha Pol FNP Collaboration with Dr Lavera Guise as a part of collaborative care agreement  Total time spent: 25 Minutes   Time spent includes review of chart, medications, test results, and follow up plan with the patient.      Dr Lavera Guise Internal medicine

## 2020-06-18 ENCOUNTER — Ambulatory Visit
Admission: RE | Admit: 2020-06-18 | Discharge: 2020-06-18 | Disposition: A | Discharge status: Home / Self Care | Payer: Medicare HMO | Source: Ambulatory Visit | Attending: Pain Medicine | Admitting: Pain Medicine

## 2020-06-18 ENCOUNTER — Encounter: Payer: Self-pay | Admitting: Pain Medicine

## 2020-06-18 ENCOUNTER — Ambulatory Visit (HOSPITAL_BASED_OUTPATIENT_CLINIC_OR_DEPARTMENT_OTHER): Payer: Medicare HMO | Admitting: Pain Medicine

## 2020-06-18 ENCOUNTER — Other Ambulatory Visit: Payer: Self-pay

## 2020-06-18 VITALS — BP 168/85 | HR 64 | Temp 97.2°F | Resp 16 | Ht 66.0 in | Wt 146.0 lb

## 2020-06-18 DIAGNOSIS — M25551 Pain in right hip: Secondary | ICD-10-CM | POA: Insufficient documentation

## 2020-06-18 DIAGNOSIS — M160 Bilateral primary osteoarthritis of hip: Secondary | ICD-10-CM | POA: Insufficient documentation

## 2020-06-18 DIAGNOSIS — M25552 Pain in left hip: Secondary | ICD-10-CM | POA: Diagnosis not present

## 2020-06-18 DIAGNOSIS — G8929 Other chronic pain: Secondary | ICD-10-CM

## 2020-06-18 MED ORDER — METHYLPREDNISOLONE ACETATE 80 MG/ML IJ SUSP
80.0000 mg | Freq: Once | INTRAMUSCULAR | Status: AC
  Administered 2020-06-18: 80 mg via INTRA_ARTICULAR
  Filled 2020-06-18: qty 1

## 2020-06-18 MED ORDER — IOHEXOL 180 MG/ML  SOLN
10.0000 mL | Freq: Once | INTRAMUSCULAR | Status: AC
  Administered 2020-06-18: 10 mL via INTRA_ARTICULAR

## 2020-06-18 MED ORDER — ROPIVACAINE HCL 2 MG/ML IJ SOLN
9.0000 mL | Freq: Once | INTRAMUSCULAR | Status: AC
  Administered 2020-06-18: 9 mL via INTRA_ARTICULAR
  Filled 2020-06-18: qty 10

## 2020-06-18 MED ORDER — LIDOCAINE HCL 2 % IJ SOLN
20.0000 mL | Freq: Once | INTRAMUSCULAR | Status: AC
  Administered 2020-06-18: 400 mg

## 2020-06-18 NOTE — Progress Notes (Signed)
PROVIDER NOTE: Information contained herein reflects review and annotations entered in association with encounter. Interpretation of such information and data should be left to medically-trained personnel. Information provided to patient can be located elsewhere in the medical record under "Patient Instructions". Document created using STT-dictation technology, any transcriptional errors that may result from process are unintentional.    Patient: Kristen Pennington  Service Category: Procedure  Provider: Gaspar Cola, MD  DOB: 28-Nov-1928  DOS: 06/18/2020  Location: [facility]  MRN: [id number]  Setting: Ambulatory - outpatient  Referring Provider: Lavera Guise, MD  Type: Established Patient  Specialty: Interventional Pain Management  PCP: Lavera Guise, MD   Primary Reason for Visit: Interventional Pain Management Treatment. CC: Hip Pain (bilateral)  Procedure:          Anesthesia, Analgesia, Anxiolysis:  Type: Intra-Articular Hip Injection #2  Primary Purpose: Diagnostic Region: Posterolateral hip joint area. Level: Lower pelvic and hip joint level. Target Area: Superior aspect of the hip joint cavity, going thru the superior portion of the capsular ligament. Approach: Posterolateral approach. Laterality: Bilateral  Type: Local Anesthesia Indication(s): Analgesia         Route: Infiltration ([place]/IM) IV Access: Declined Sedation: Declined  Local Anesthetic: Lidocaine 1-2%  Position: Prone Prepped Area: Entire Posterolateral hip area. DuraPrep (Iodine Povacrylex [0.7% available iodine] and Isopropyl Alcohol, 74% w/w)   Indications: 1. Chronic hip pain (2ry area of Pain) (Bilateral) (R>L)   2. Osteoarthritis of hips (Bilateral)    Pain Score: Pre-procedure: 5 /10 Post-procedure: 5 /10   Post-Procedure Evaluation  Procedure (06/04/2020): Diagnostic left glenohumeral and acromioclavicular joint injection #1 under fluoroscopic guidance, no sedation Pre-procedure  pain level: 10/10 Post-procedure: 0/10 (100% relief)  Sedation: None.  Effectiveness during initial hour after procedure(Ultra-Short Term Relief): 90 %.  Local anesthetic used: Long-acting (4-6 hours) Effectiveness: Defined as any analgesic benefit obtained secondary to the administration of local anesthetics. This carries significant diagnostic value as to the etiological location, or anatomical origin, of the pain. Duration of benefit is expected to coincide with the duration of the local anesthetic used.  Effectiveness during initial 4-6 hours after procedure(Short-Term Relief): 90 %.  Long-term benefit: Defined as any relief past the pharmacologic duration of the local anesthetics.  Effectiveness past the initial 6 hours after procedure(Long-Term Relief): 100 %.  Current benefits: Defined as benefit that persist at this time.   Analgesia:  90-100% better Function: Kristen Pennington reports improvement in function ROM: Kristen Pennington reports improvement in ROM  Pre-op H&P Assessment:  Kristen Pennington is a 86 y.o. (year old), female patient, seen today for interventional treatment. She  has a past surgical history that includes Spine surgery; Rotator cuff repair (2007); Dilation and curettage of uterus (1970); Colonoscopy with propofol (N/A, 05/03/2015); polypectomy (05/03/2015); Colonoscopy (05-03-15); Eye surgery (Right, 2017); and Endometrial biopsy. Kristen Pennington has a current medication list which includes the following prescription(s): acetaminophen, aripiprazole, bisoprolol-hydrochlorothiazide, carboxymethylcellulose sodium, vitamin d3, dicyclomine, escitalopram, estradiol, fluticasone, hydroxyzine, menthol-methyl salicylate, lubiprostone, centrum silver 50+women, oxybutynin, pantoprazole, systane, promethazine, ropinirole, simethicone, trazodone, and xiidra. Her primarily concern today is the Hip Pain (bilateral)  Initial Vital Signs:  Pulse/HCG Rate: 64ECG Heart Rate: (!) 53 Temp: (!)  97.2 F (36.2 C) Resp: 18 BP: (!) 187/91 SpO2: 99 %  BMI: Estimated body mass index is 23.57 kg/m as calculated from the following:   Height as of this encounter: 5\' 6"  (1.676 m).   Weight as of this encounter: 146 lb (66.2 kg).  Risk  Assessment: Allergies: Reviewed. She has No Known Allergies.  Allergy Precautions: None required Coagulopathies: Reviewed. None identified.  Blood-thinner therapy: None at this time Active Infection(s): Reviewed. None identified. Kristen Pennington is afebrile  Site Confirmation: Kristen Pennington was asked to confirm the procedure and laterality before marking the site Procedure checklist: Completed Consent: Before the procedure and under the influence of no sedative(s), amnesic(s), or anxiolytics, the patient was informed of the treatment options, risks and possible complications. To fulfill our ethical and legal obligations, as recommended by the American Medical Association's Code of Ethics, I have informed the patient of my clinical impression; the nature and purpose of the treatment or procedure; the risks, benefits, and possible complications of the intervention; the alternatives, including doing nothing; the risk(s) and benefit(s) of the alternative treatment(s) or procedure(s); and the risk(s) and benefit(s) of doing nothing. The patient was provided information about the general risks and possible complications associated with the procedure. These may include, but are not limited to: failure to achieve desired goals, infection, bleeding, organ or nerve damage, allergic reactions, paralysis, and death. In addition, the patient was informed of those risks and complications associated to the procedure, such as failure to decrease pain; infection; bleeding; organ or nerve damage with subsequent damage to sensory, motor, and/or autonomic systems, resulting in permanent pain, numbness, and/or weakness of one or several areas of the body; allergic reactions; (i.e.:  anaphylactic reaction); and/or death. Furthermore, the patient was informed of those risks and complications associated with the medications. These include, but are not limited to: allergic reactions (i.e.: anaphylactic or anaphylactoid reaction(s)); adrenal axis suppression; blood sugar elevation that in diabetics may result in ketoacidosis or comma; water retention that in patients with history of congestive heart failure may result in shortness of breath, pulmonary edema, and decompensation with resultant heart failure; weight gain; swelling or edema; medication-induced neural toxicity; particulate matter embolism and blood vessel occlusion with resultant organ, and/or nervous system infarction; and/or aseptic necrosis of one or more joints. Finally, the patient was informed that Medicine is not an exact science; therefore, there is also the possibility of unforeseen or unpredictable risks and/or possible complications that may result in a catastrophic outcome. The patient indicated having understood very clearly. We have given the patient no guarantees and we have made no promises. Enough time was given to the patient to ask questions, all of which were answered to the patient's satisfaction. Ms. Pacha has indicated that she wanted to continue with the procedure. Attestation: I, the ordering provider, attest that I have discussed with the patient the benefits, risks, side-effects, alternatives, likelihood of achieving goals, and potential problems during recovery for the procedure that I have provided informed consent. Date  Time: 06/18/2020 10:50 AM  Pre-Procedure Preparation:  Monitoring: As per clinic protocol. Respiration, ETCO2, SpO2, BP, heart rate and rhythm monitor placed and checked for adequate function Safety Precautions: Patient was assessed for positional comfort and pressure points before starting the procedure. Time-out: I initiated and conducted the "Time-out" before starting the  procedure, as per protocol. The patient was asked to participate by confirming the accuracy of the "Time Out" information. Verification of the correct person, site, and procedure were performed and confirmed by me, the nursing staff, and the patient. "Time-out" conducted as per Joint Commission's Universal Protocol (UP.01.01.01). Time: 1217  Description of Procedure:          Safety Precautions: Aspiration looking for blood return was conducted prior to all injections. At no point did we  inject any substances, as a needle was being advanced. No attempts were made at seeking any paresthesias. Safe injection practices and needle disposal techniques used. Medications properly checked for expiration dates. SDV (single dose vial) medications used. Description of the Procedure: Protocol guidelines were followed. The patient was placed in position over the fluoroscopy table. The target area was identified and the area prepped in the usual manner. Skin & deeper tissues infiltrated with local anesthetic. Appropriate amount of time allowed to pass for local anesthetics to take effect. The procedure needles were then advanced to the target area. Proper needle placement secured. Negative aspiration confirmed. Solution injected in intermittent fashion, asking for systemic symptoms every 0.5cc of injectate. The needles were then removed and the area cleansed, making sure to leave some of the prepping solution back to take advantage of its long term bactericidal properties. Vitals:   06/18/20 1049 06/18/20 1216 06/18/20 1221 06/18/20 1225  BP: (!) 187/91 (!) 179/80 (!) 173/73 (!) 168/85  Pulse: 64     Resp: 18 16 18 16   Temp: (!) 97.2 F (36.2 C)     TempSrc: Temporal     SpO2: 99% 99% 99% 99%  Weight: 146 lb (66.2 kg)     Height: 5\' 6"  (1.676 m)       Start Time: 1217 hrs. End Time: 1225 hrs. Materials:  Needle(s) Type: Spinal Needle Gauge: 22G Length: 5.0-in Medication(s): Please see orders for  medications and dosing details.  Imaging Guidance (Non-Spinal):          Type of Imaging Technique: Fluoroscopy Guidance (Non-Spinal) Indication(s): Assistance in needle guidance and placement for procedures requiring needle placement in or near specific anatomical locations not easily accessible without such assistance. Exposure Time: Please see nurses notes. Contrast: Before injecting any contrast, we confirmed that the patient did not have an allergy to iodine, shellfish, or radiological contrast. Once satisfactory needle placement was completed at the desired level, radiological contrast was injected. Contrast injected under live fluoroscopy. No contrast complications. See chart for type and volume of contrast used. Fluoroscopic Guidance: I was personally present during the use of fluoroscopy. "Tunnel Vision Technique" used to obtain the best possible view of the target area. Parallax error corrected before commencing the procedure. "Direction-depth-direction" technique used to introduce the needle under continuous pulsed fluoroscopy. Once target was reached, antero-posterior, oblique, and lateral fluoroscopic projection used confirm needle placement in all planes. Images permanently stored in EMR. Interpretation: I personally interpreted the imaging intraoperatively. Adequate needle placement confirmed in multiple planes. Appropriate spread of contrast into desired area was observed. No evidence of afferent or efferent intravascular uptake. Permanent images saved into the patient's record.  Antibiotic Prophylaxis:   Anti-infectives (From admission, onward)   None     Indication(s): None identified  Post-operative Assessment:  Post-procedure Vital Signs:  Pulse/HCG Rate: 64(!) 53 Temp: (!) 97.2 F (36.2 C) Resp: 16 BP: (!) 168/85 SpO2: 99 %  EBL: None  Complications: No immediate post-treatment complications observed by team, or reported by patient.  Note: The patient tolerated the  entire procedure well. A repeat set of vitals were taken after the procedure and the patient was kept under observation following institutional policy, for this type of procedure. Post-procedural neurological assessment was performed, showing return to baseline, prior to discharge. The patient was provided with post-procedure discharge instructions, including a section on how to identify potential problems. Should any problems arise concerning this procedure, the patient was given instructions to immediately contact us, at any time,  without hesitation. In any case, we plan to contact the patient by telephone for a follow-up status report regarding this interventional procedure.  Comments:  No additional relevant information.  Plan of Care  Orders:  Orders Placed This Encounter  Procedures  . HIP INJECTION    Scheduling Instructions:     Side: Bilateral     Sedation: Patient's choice.     Timeframe: Today  . DG PAIN [facility] C-ARM 1-60 MIN NO REPORT    Intraoperative interpretation by procedural physician at [facility].    Standing Status:   Standing    Number of Occurrences:   1    Order Specific Question:   Reason for exam:    Answer:   Assistance in needle guidance and placement for procedures requiring needle placement in or near specific anatomical locations not easily accessible without such assistance.  . Informed Consent Details: Physician/Practitioner Attestation; Transcribe to consent form and obtain patient signature    Nursing Order: Transcribe to consent form and obtain patient signature. Note: Always confirm laterality of pain with Ms. Kindler, before procedure.    Order Specific Question:   Physician/Practitioner attestation of informed consent for procedure/surgical case    Answer:   I, the physician/practitioner, attest that I have discussed with the patient the benefits, risks, side effects, alternatives, likelihood of achieving goals and potential problems during  recovery for the procedure that I have provided informed consent.    Order Specific Question:   Procedure    Answer:   Hip injection    Order Specific Question:   Physician/Practitioner performing the procedure    Answer:   Evelisse Szalkowski A. Dossie Arbour, MD    Order Specific Question:   Indication/Reason    Answer:   Hip Joint Pain (Arthralgia)  . Provide equipment / supplies at bedside    "Block Tray" (Disposable  single use) Needle type: SpinalSpinal Amount/quantity: 2 Size: Medium (5-inch) Gauge: 22G    Standing Status:   Standing    Number of Occurrences:   1    Order Specific Question:   Specify    Answer:   Block Tray   Chronic Opioid Analgesic:  No opioid analgesics prescribed by our practice.  She used to get a fentanyl 12.5 mcg/h patch from [place], but we discontinued it due to improvement of pain and cognitive impairment secondary to the medication. Highest recorded MME/day: 28.8 mg/day MME/day: 0 mg/day   Medications ordered for procedure: Meds ordered this encounter  Medications  . lidocaine (XYLOCAINE) 2 % (with pres) injection 400 mg  . iohexol (OMNIPAQUE) 180 MG/ML injection 10 mL    Must be Myelogram-compatible. If not available, you may substitute with a water-soluble, non-ionic, hypoallergenic, myelogram-compatible radiological contrast medium.  . ropivacaine (PF) 2 mg/mL (0.2%) (NAROPIN) injection 9 mL  . methylPREDNISolone acetate (DEPO-MEDROL) injection 80 mg   Medications administered: We administered lidocaine, iohexol, ropivacaine (PF) 2 mg/mL (0.2%), and methylPREDNISolone acetate.  See the medical record for exact dosing, route, and time of administration.  Follow-up plan:   Return in about 2 weeks (around 07/02/2020) for (F2F), (PP) Follow-up.       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)   Considering:   Diagnostic bilateral lumbar  facet block  Diagnostic bilateral SI joint block  Possible diagnostic bilateral femoral nerve and obturator nerve block  Diagnostic midline L3-4 LESI  Diagnostic midline caudal ESI    PRN  Procedures:   Palliative bilateral IA hip joint injection #2 (100/100/90/100)    Recent Visits Date Type Provider Dept  06/04/20 Procedure visit Milinda Pointer, MD [facility]  05/30/20 Procedure visit Milinda Pointer, MD [facility]  05/16/20 Procedure visit Milinda Pointer, MD [facility]  05/06/20 Office Visit Milinda Pointer, MD [facility]  Showing recent visits within past 90 days and meeting all other requirements Today's Visits Date Type Provider Dept  06/18/20 Procedure visit Milinda Pointer, MD [facility]  Showing today's visits and meeting all other requirements Future Appointments Date Type Provider Dept  07/01/20 Appointment Milinda Pointer, MD [facility]  Showing future appointments within next 90 days and meeting all other requirements  Disposition: Discharge home  Discharge (Date  Time): 06/18/2020; 1240 hrs.   Primary Care Physician: Lavera Guise, MD Location: [facility] Note by: Gaspar Cola, MD Date: 06/18/2020; Time: 12:33 PM  Disclaimer:  Medicine is not an Chief Strategy Officer. The only guarantee in medicine is that nothing is guaranteed. It is important to note that the decision to proceed with this intervention was based on the information collected from the patient. The Data and conclusions were drawn from the patient's questionnaire, the interview, and the physical examination. Because the information was provided in large part by the patient, it cannot be guaranteed that it has not been purposely or unconsciously manipulated. Every effort has been made to obtain as much relevant data as possible for this evaluation. It is important to note that the conclusions  that lead to this procedure are derived in large part from the available data. Always take into account that the treatment will also be dependent on availability of resources and existing treatment guidelines, considered by other Pain Management Practitioners as being common knowledge and practice, at the time of the intervention. For Medico-Legal purposes, it is also important to point out that variation in procedural techniques and pharmacological choices are the acceptable norm. The indications, contraindications, technique, and results of the above procedure should only be interpreted and judged by a Board-Certified Interventional Pain Specialist with extensive familiarity and expertise in the same exact procedure and technique.

## 2020-06-18 NOTE — Patient Instructions (Addendum)

## 2020-06-18 NOTE — Progress Notes (Signed)
Safety precautions to be maintained throughout the outpatient stay will include: orient to surroundings, keep bed in low position, maintain call bell within reach at all times, provide assistance with transfer out of bed and ambulation.  

## 2020-06-19 ENCOUNTER — Telehealth: Payer: Self-pay

## 2020-06-19 NOTE — Telephone Encounter (Signed)
Attempted to call patient post procedure. Unable to reach patient.

## 2020-06-27 ENCOUNTER — Telehealth: Payer: Self-pay | Admitting: Pain Medicine

## 2020-06-27 NOTE — Telephone Encounter (Signed)
Patient called stating she has been dizzy since she had the procedure, wants to know if she received anything that would make her like this in the procedure meds. Please call patient and advise.

## 2020-06-27 NOTE — Telephone Encounter (Signed)
Medication management - Patient called and requested Dr. Shea Evans call her back "sometime today" as she stated she is having problems with "extreme dizziness" at times and questions if due to current medication regimen. Requests to speak to Dr. Shea Evans about her current medications and symptoms today.  Agreed to send message to provider with request.

## 2020-06-27 NOTE — Telephone Encounter (Signed)
I informed patient that it is unlikely that any of the meds given during the hip injection would cause dizziness. Has already spoken with her PCP about this, and will discuss at appt here next week.

## 2020-06-27 NOTE — Telephone Encounter (Signed)
Returned call to patient.  She reports she has been having dizziness.  She reports the only change in her medication regimen already pain injections that she received recently at her pain provider.  Advised patient to stop the trazodone at bedtime for sleep for now.  Advised patient to use only half tablet of hydroxyzine as needed and to limit use and not to use it every day.  Advised patient to use a walker to walk.  Patient advised to also discuss with pain provider about her recent dizziness, unknown if her injections are contributing to the same.  Patient advised to call us back if she has continued dizziness in spite of making these changes.

## 2020-06-27 NOTE — Telephone Encounter (Signed)
Patient called again wanting [place] to check to see if her medicines are making her dizzy.

## 2020-06-28 ENCOUNTER — Other Ambulatory Visit: Payer: Self-pay

## 2020-06-28 DIAGNOSIS — K219 Gastro-esophageal reflux disease without esophagitis: Secondary | ICD-10-CM

## 2020-06-28 MED ORDER — FLUTICASONE PROPIONATE 50 MCG/ACT NA SUSP
NASAL | 1 refills | Status: DC
Start: 2020-06-28 — End: 2020-11-04

## 2020-06-28 MED ORDER — ESTRADIOL 0.1 MG/GM VA CREA
TOPICAL_CREAM | VAGINAL | 1 refills | Status: DC
Start: 2020-06-28 — End: 2021-10-29

## 2020-06-28 MED ORDER — OXYBUTYNIN CHLORIDE ER 10 MG PO TB24
10.0000 mg | ORAL_TABLET | Freq: Every day | ORAL | 2 refills | Status: DC
Start: 2020-06-28 — End: 2020-10-07

## 2020-06-28 MED ORDER — PANTOPRAZOLE SODIUM 40 MG PO TBEC: 40.0000 mg | DELAYED_RELEASE_TABLET | Freq: Every day | ORAL | 1 refills | Status: DC

## 2020-06-30 NOTE — Progress Notes (Signed)
PROVIDER NOTE: Information contained herein reflects review and annotations entered in association with encounter. Interpretation of such information and data should be left to medically-trained personnel. Information provided to patient can be located elsewhere in the medical record under "Patient Instructions". Document created using STT-dictation technology, any transcriptional errors that may result from process are unintentional.    Patient: Kristen Pennington  Service Category: E/M  Provider: Gaspar Cola, MD  DOB: September 29, 1928  DOS: 07/01/2020  Specialty: Interventional Pain Management  MRN: [id number]  Setting: Ambulatory outpatient  PCP: Lavera Guise, MD  Type: Established Patient    Referring Provider: Lavera Guise, MD  Location: Office  Delivery: Face-to-face     HPI  Kristen Pennington, a 86 y.o. year old female, is here today because of her Chronic pain of both hips [M25.551, M25.552, G89.29]. Kristen Pennington's primary complain today is Hip Pain Last encounter: My last encounter with her was on 06/27/2020. 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 Pain Assessment: Severity of Chronic pain is reported as a 0-No pain/10. Location: Hip  /Denies. Modifying factor(s): procedure. Vitals:  height is '5\' 6"'  (1.676 m) and weight is 146 lb (66.2 kg). Her temperature is 97.2 F (36.2 C) (abnormal). Her blood pressure is 148/78 (abnormal) and her pulse is 56 (abnormal). Her oxygen saturation is 97%.   Reason for encounter: post-procedure assessment.  The patient indicates having attained 100% relief of the pain that seems to be ongoing with the bilateral intra-articular hip joint injection #2.  This again confirms that this is where the pain seemed to have been coming from. In any case, since she is pain-free, we will see her on a as needed basis.  Post-Procedure Evaluation  Procedure (06/27/2020): Diagnostic bilateral intra-articular hip joint injection #2 under fluoroscopic guidance, no sedation Pre-procedure pain level: 5/10 Post-procedure: 5/10 No initial benefit, possibly due to rapid discharge after no sedation procedure, without enough time to allow full onset of block.  Sedation: Please see nurses note.  Effectiveness during initial hour after procedure(Ultra-Short Term Relief): 100 %.  Local anesthetic used: Long-acting (4-6 hours) Effectiveness: Defined as any analgesic benefit obtained secondary to the administration of local anesthetics. This carries significant diagnostic value as to the etiological location, or anatomical origin, of the pain. Duration of benefit is expected to coincide with the duration of the local anesthetic used.  Effectiveness during initial 4-6 hours after procedure(Short-Term Relief): 100 %.  Long-term benefit: Defined as any relief past the pharmacologic duration of the local anesthetics.  Effectiveness past the initial 6 hours after procedure(Long-Term Relief): 100 % (ongoing).  Current benefits: Defined as benefit that  persist at this time.   Analgesia:  90-100% better Function: Kristen Pennington reports improvement in function ROM: Kristen Pennington reports improvement in ROM  Pharmacotherapy Assessment  Analgesic: No opioid analgesics prescribed by our practice.  She used to get a fentanyl 12.5 mcg/h patch from [place], but we discontinued it due to improvement of pain and cognitive impairment secondary to the medication. Highest recorded MME/day: 28.8 mg/day MME/day: 0 mg/day   Monitoring: [place] PMP: PDMP reviewed during this encounter.       Pharmacotherapy: No side-effects or adverse reactions reported. Compliance: No problems identified. Effectiveness: Clinically acceptable.  Chauncey Fischer, RN  07/01/2020 10:47 AM  Sign when Signing Visit Safety precautions to be maintained throughout the outpatient stay will include: orient to surroundings, keep bed in low position, maintain call bell within reach at all times, provide assistance with transfer out of bed and ambulation.     UDS:  Summary  Date Value Ref Range Status  02/14/2020 Note  Final    Comment:    ==================================================================== Compliance Drug Analysis, Ur ==================================================================== Test                             Result       Flag       Units  Drug Present and Declared for Prescription Verification   Fentanyl                       6            EXPECTED   ng/mg creat   Norfentanyl                    32           EXPECTED   ng/mg creat    Source of fentanyl is a scheduled prescription medication, including    IV, patch, and transmucosal formulations. Norfentanyl is an expected    metabolite of fentanyl.    Citalopram                     PRESENT      EXPECTED   Desmethylcitalopram            PRESENT      EXPECTED    Desmethylcitalopram is an expected metabolite of citalopram or the    enantiomeric form, escitalopram.    Mirtazapine                    PRESENT       EXPECTED   Acetaminophen                  PRESENT      EXPECTED  Drug Absent but Declared for Prescription Verification   Promethazine                   Not Detected UNEXPECTED ==================================================================== Test                      Result    Flag   Units      Ref Range   Creatinine              199              mg/dL      >=20 ==================================================================== Declared Medications:  The flagging and interpretation on this report are based on the  following declared medications.  Unexpected results may arise from  inaccuracies in the declared medications.   **Note: The testing scope of this panel includes these medications:   Escitalopram (Lexapro)  Fentanyl (Duragesic)  Mirtazapine (Remeron)  Promethazine (Phenergan)   **Note: The testing scope of this panel does not include small to  moderate amounts of these reported medications:   Acetaminophen (Tylenol)   **Note: The testing scope of this panel does not include the  following reported medications:   Bisoprolol  Dicyclomine (Bentyl)  Estradiol (Estrace)  Eye Drop  Fluticasone (Flonase)  Hydrochlorothiazide  Lubiprostone (Amitiza)  Meloxicam (Mobic)  Multivitamin (Centrum)  Oxybutynin (Ditropan)  Pantoprazole (Protonix)  Ropinirole (Requip)  Simethicone  Topical  Vitamin D3 ==================================================================== For clinical consultation, please call [phone]. ====================================================================      ROS  Constitutional: Denies any fever or chills Gastrointestinal: No reported hemesis, hematochezia, vomiting, or acute GI distress Musculoskeletal: Denies any acute onset joint swelling, redness, loss of ROM, or weakness Neurological: No reported episodes of acute onset apraxia, aphasia, dysarthria, agnosia, amnesia, paralysis, loss of coordination, or loss of  consciousness  Medication Review  Carboxymethylcellulose Sodium, Centrum Silver 50+Women, Lifitegrast, Menthol-Methyl Salicylate, Polyethyl Glycol-Propyl Glycol, Vitamin D3, acetaminophen, bisoprolol-hydrochlorothiazide, dicyclomine, escitalopram, estradiol, fluticasone, hydrOXYzine, lubiprostone, oxybutynin, pantoprazole, promethazine, rOPINIRole, and simethicone  History Review  Allergy: Kristen Pennington has No Known Allergies. Drug: Kristen Pennington  reports no history of drug use. Alcohol:  reports no history of alcohol use. Tobacco:  reports that she has never smoked. She has never used smokeless tobacco. Social: Kristen Pennington  reports that she has never smoked. She has never used smokeless tobacco. She reports that she does not drink alcohol and does not use drugs. 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 Surgical: Kristen Pennington  has a past surgical history that includes Spine surgery; Rotator cuff repair (2007); Dilation and curettage of uterus (1970); Colonoscopy with propofol (N/A, 05/03/2015); polypectomy (05/03/2015); Colonoscopy (05-03-15); Eye surgery (Right, 2017); and Endometrial biopsy. Family: family history includes Alzheimer's disease in her father; Arthritis in her mother; Cancer in her brother; Heart disease in her sister.  Laboratory Chemistry Profile   Renal Lab Results  Component Value Date   BUN 19 02/14/2020   CREATININE 1.12  (H) 02/14/2020   BCR 17 02/14/2020   GFRAA 50 (L) 02/14/2020   GFRNONAA 44 (L) 02/14/2020     Hepatic Lab Results  Component Value Date   AST 29 02/14/2020   ALBUMIN 4.9 (H) 02/14/2020   ALKPHOS 76 02/14/2020     Electrolytes Lab Results  Component Value Date   NA 144 02/14/2020   K 5.3 (H) 02/14/2020   CL 103 02/14/2020   CALCIUM 10.4 (H) 02/14/2020   MG 2.3 02/14/2020     Bone Lab Results  Component Value Date   VD25OH 57.1 08/10/2018   25OHVITD1 84 02/14/2020   25OHVITD2 <1.0 02/14/2020   25OHVITD3 84 02/14/2020     Inflammation (CRP: Acute Phase) (ESR: Chronic Phase) Lab Results  Component Value Date   CRP <0.5 10/26/2019   ESRSEDRATE 2 10/26/2019       Note: Above Lab results reviewed.  Recent Imaging Review  DG PAIN CLINIC C-ARM 1-60 MIN NO REPORT Fluoro was used, but no Radiologist interpretation will be provided.  Please refer to "NOTES" tab for provider progress note. Note: Reviewed  Physical Exam  General appearance: Well nourished, well developed, and well hydrated. In no apparent acute distress Mental status: Alert, oriented x 3 (person, place, & time)       Respiratory: No evidence of acute respiratory distress Eyes: PERLA Vitals: BP (!) 148/78   Pulse (!) 56   Temp (!) 97.2 F (36.2 C)   Ht '5\' 6"'  (1.676 m)   Wt 146 lb (66.2 kg)   SpO2 97%   BMI 23.57 kg/m  BMI: Estimated body mass index is 23.57 kg/m as calculated from the following:   Height as of this encounter: '5\' 6"'  (1.676 m).   Weight as of this encounter: 146 lb (66.2 kg). Ideal: Ideal body weight: 59.3 kg (130 lb 11.7 oz) Adjusted ideal body weight: 62.1 kg (136 lb 13.4 oz)  Assessment   Status Diagnosis  Resolved Controlled 1. Chronic hip pain (2ry area of Pain) (Bilateral) (R>L)   2. Chronic pain syndrome      Updated Problems: Problem  Encounter for General Adult Medical Examination With Abnormal Findings  Hyperkalemia  Hypercalcemia  Abnormal Kidney  Function    Plan of Care  Problem-specific:  No problem-specific Assessment & Plan notes found for this encounter.  Kristen Pennington has a current medication list which includes the following long-term medication(s): bisoprolol-hydrochlorothiazide, dicyclomine, escitalopram, fluticasone, pantoprazole, and ropinirole.  Pharmacotherapy (Medications Ordered): No orders of the defined types were placed in this encounter.  Orders:  No orders of the defined types were placed in this encounter.  Follow-up plan:   Return if symptoms worsen or fail to improve.      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)   Considering:   Diagnostic bilateral lumbar facet block  Diagnostic bilateral SI joint block  Possible diagnostic bilateral femoral nerve and obturator nerve block  Diagnostic midline L3-4 LESI  Diagnostic midline caudal ESI    PRN Procedures:   Palliative bilateral IA hip joint injection #2 (100/100/90/100)     Recent Visits Date Type Provider Dept  06/18/20 Procedure visit Milinda Pointer, MD [facility]  06/04/20 Procedure visit Milinda Pointer, MD [facility]  05/30/20 Procedure visit Milinda Pointer, MD [facility]  05/16/20 Procedure visit Milinda Pointer, MD [facility]  05/06/20 Office Visit Milinda Pointer, MD [facility]  Showing recent visits within past 90 days and meeting all other requirements Today's Visits Date Type Provider Dept  07/01/20 Office Visit Milinda Pointer, MD [facility]  Showing today's visits and meeting all other requirements Future Appointments No visits were found meeting these conditions. Showing future appointments within next 90 days and meeting all other requirements  I discussed the assessment and treatment plan with the patient.  The patient was provided an opportunity to ask questions and all were answered. The patient agreed with the plan and demonstrated an understanding of the instructions.  Patient advised to call back or seek an in-person evaluation if the symptoms or condition worsens.  Duration of encounter: 22 minutes.  Note by: Gaspar Cola, MD Date: 07/01/2020; Time: 11:10 AM

## 2020-07-01 ENCOUNTER — Ambulatory Visit: Payer: Medicare HMO | Attending: Pain Medicine | Admitting: Pain Medicine

## 2020-07-01 ENCOUNTER — Other Ambulatory Visit: Payer: Self-pay

## 2020-07-01 ENCOUNTER — Encounter: Payer: Self-pay | Admitting: Pain Medicine

## 2020-07-01 VITALS — BP 148/78 | HR 56 | Temp 97.2°F | Ht 66.0 in | Wt 146.0 lb

## 2020-07-01 DIAGNOSIS — G8929 Other chronic pain: Secondary | ICD-10-CM | POA: Diagnosis not present

## 2020-07-01 DIAGNOSIS — M25552 Pain in left hip: Secondary | ICD-10-CM | POA: Insufficient documentation

## 2020-07-01 DIAGNOSIS — G894 Chronic pain syndrome: Secondary | ICD-10-CM | POA: Insufficient documentation

## 2020-07-01 DIAGNOSIS — M25551 Pain in right hip: Secondary | ICD-10-CM | POA: Diagnosis not present

## 2020-07-01 NOTE — Progress Notes (Signed)
Safety precautions to be maintained throughout the outpatient stay will include: orient to surroundings, keep bed in low position, maintain call bell within reach at all times, provide assistance with transfer out of bed and ambulation.  

## 2020-07-31 DIAGNOSIS — H353131 Nonexudative age-related macular degeneration, bilateral, early dry stage: Secondary | ICD-10-CM | POA: Diagnosis not present

## 2020-08-01 ENCOUNTER — Telehealth (INDEPENDENT_AMBULATORY_CARE_PROVIDER_SITE_OTHER): Payer: Medicare HMO | Admitting: Psychiatry

## 2020-08-01 ENCOUNTER — Other Ambulatory Visit: Payer: Self-pay | Admitting: Psychiatry

## 2020-08-01 ENCOUNTER — Encounter: Payer: Self-pay | Admitting: Psychiatry

## 2020-08-01 ENCOUNTER — Other Ambulatory Visit: Payer: Self-pay | Admitting: Internal Medicine

## 2020-08-01 ENCOUNTER — Other Ambulatory Visit: Payer: Self-pay

## 2020-08-01 DIAGNOSIS — F5105 Insomnia due to other mental disorder: Secondary | ICD-10-CM | POA: Diagnosis not present

## 2020-08-01 DIAGNOSIS — F3342 Major depressive disorder, recurrent, in full remission: Secondary | ICD-10-CM

## 2020-08-01 DIAGNOSIS — Z8659 Personal history of other mental and behavioral disorders: Secondary | ICD-10-CM | POA: Diagnosis not present

## 2020-08-01 DIAGNOSIS — F411 Generalized anxiety disorder: Secondary | ICD-10-CM | POA: Diagnosis not present

## 2020-08-01 DIAGNOSIS — I10 Essential (primary) hypertension: Secondary | ICD-10-CM

## 2020-08-01 DIAGNOSIS — R69 Illness, unspecified: Secondary | ICD-10-CM | POA: Diagnosis not present

## 2020-08-01 MED ORDER — MELOXICAM 7.5 MG PO TABS: 7.5000 mg | ORAL_TABLET | Freq: Every day | ORAL | 2 refills | Status: DC

## 2020-08-01 MED ORDER — ESCITALOPRAM OXALATE 20 MG PO TABS: 20.0000 mg | ORAL_TABLET | Freq: Every day | ORAL | 0 refills | Status: DC

## 2020-08-01 NOTE — Progress Notes (Signed)
Virtual Visit via Telephone Note  I connected with Kristen Pennington on 08/01/20 at 10:30 AM EST by telephone and verified that I am speaking with the correct person using two identifiers.  Location Provider Location : [facility] Patient Location : Home  Participants: Patient , Provider   I discussed the limitations, risks, security and privacy concerns of performing an evaluation and management service by telephone and the availability of in person appointments. I also discussed with the patient that there may be a patient responsible charge related to this service. The patient expressed understanding and agreed to proceed.    I discussed the assessment and treatment plan with the patient. The patient was provided an opportunity to ask questions and all were answered. The patient agreed with the plan and demonstrated an understanding of the instructions.   The patient was advised to call back or seek an in-person evaluation if the symptoms worsen or if the condition fails to improve as anticipated.   [facility] MD OP Progress Note  08/01/2020 2:48 PM JAZZLYN HUIZENGA  MRN:  [id number]  Chief Complaint:  Chief Complaint    Follow-up     HPI: Kristen Pennington is a 87 year old Caucasian female, widowed, lives in [place], has a history of GAD, MDD, delusional disorder, insomnia, hearing loss, vision loss, OSA, hypertension was evaluated by phone today.  Patient today reports she continues to have several situational stresses, relationship struggles where she lives.  She reports she is trying to talk to management to make some changes.  She does not know if she can find another housing at this time however is open to looking into that.  Patient reports overall she is able to cope with her anxiety symptoms.  She is compliant on her medications as prescribed.  She reports she has been taking the trazodone at night for sleep which does help.  Patient denies any suicidality, homicidality or  perceptual disturbances.  She reports she spent the holidays with her daughter and that went okay.    Patient denies any other concerns today.  Visit Diagnosis:    ICD-10-CM   1. Generalized anxiety disorder  F41.1 escitalopram (LEXAPRO) 20 MG tablet  2. MDD (major depressive disorder), recurrent, in full remission ([place])  F33.42   3. Insomnia due to mental condition  F51.05   4. History of delusional disorder  Z86.59     Past Psychiatric History: I have reviewed past psychiatric history from my progress note on 07/25/2018 . Past trials of klonopin, abilify, lexapro, remeron.  Past Medical History:  Past Medical History:  Diagnosis Date  . Arthritis   . Atrophic vaginitis 03/06/2013   Last Assessment & Plan:  She will plan to continue estradiol vaginal cream.  Prescription was rewritten stipulating use of the generic product.   . Atypical migraine 03/06/2013   Last Assessment & Plan:  Since she rarely takes sumatriptan, we will discontinue it. Continue sparing use of OTC migraine products.   . Avitaminosis D 03/06/2013   Last Assessment & Plan:  Recheck vitamin D level. Plan to continue vitamin D supplementation.   . Ceratitis 08/01/2013   Last Assessment & Plan:  Because of the expense, she will discontinue Restasis. She will use over-the-counter moisturizing eyedrops and liquid gel.   . Colon polyp   . 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   . 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   . Cystocele, midline 03/06/2013  . Demoralization and apathy 03/06/2013   Last Assessment & Plan:  Since she has been taking bupropion only once a day, I will write the prescription with the correct instructions and correct quantity. She has done well on this for years and she is encouraged to take it regularly   .  Depression   . Environmental allergies   . Epistaxis   . Essential (primary) hypertension 05/17/2013   Last Assessment & Plan:  Her blood pressure is well-controlled. We will plan to continue hydrochlorothiazide and benazepril.  Both are generic and should be affordable on her health plan.   Marland Kitchen GERD (gastroesophageal reflux disease)   . Hemorrhoid   . Inflammation of sacroiliac joint ([place]) 03/06/2013   Last Assessment & Plan:  She is doing well on her present regimen of fentanyl patch supplemented with sparing use of tramadol/acetaminophen. We will continue that regimen.   . Sinusitis   . Sleep apnea    CPAP    Past Surgical History:  Procedure Laterality Date  . COLONOSCOPY  05-03-15  . COLONOSCOPY WITH PROPOFOL N/A 05/03/2015   Procedure: COLONOSCOPY WITH PROPOFOL;  Surgeon: Lucilla Lame, MD;  Location: [facility];  Service: Endoscopy;  Laterality: N/A;  CPAP  . DILATION AND CURETTAGE OF UTERUS  1970  . ENDOMETRIAL BIOPSY    . EYE SURGERY Right 2017  . POLYPECTOMY  05/03/2015   Procedure: POLYPECTOMY;  Surgeon: Lucilla Lame, MD;  Location: [place];  Service: Endoscopy;;  . ROTATOR CUFF REPAIR  2007  . SPINE SURGERY      Family Psychiatric History: I have reviewed family psychiatric history from my progress note on 07/25/2018  Family History:  Family History  Problem Relation Age of Onset  . Arthritis Mother   . Alzheimer's disease Father   . Heart disease Sister   . Cancer Brother   . Ovarian cancer Neg Hx   . Breast cancer Neg Hx   . Colon cancer Neg Hx   . Diabetes Neg Hx     Social History: I have reviewed social history from my progress note on 07/25/2018 Social History   Socioeconomic History  . Marital status: Widowed    Spouse name: Not on file  . Number of children: 2  . Years of education: Not on file  . Highest education level: Some college, no degree  Occupational History  . Not on file  Tobacco Use  . Smoking status: Never Smoker  .  Smokeless tobacco: Never Used  Vaping Use  . Vaping Use: Never used  Substance and Sexual Activity  . Alcohol use: No    Alcohol/week: 0.0 standard drinks  . Drug use: No  . Sexual activity: Not Currently  Other Topics Concern  . Not on file  Social History Narrative  . Not on file   Social Determinants of Health   Financial Resource Strain: Not on file  Food Insecurity: Not on file  Transportation Needs: Not on file  Physical Activity: Not on file  Stress: Not on file  Social Connections: Not on file    Allergies: No Known Allergies  Metabolic Disorder Labs: Lab Results  Component Value Date   HGBA1C 5.4 08/01/2018   Lab Results  Component Value Date   PROLACTIN 8.1 08/01/2018   Lab Results  Component Value Date   CHOL 245 (H) 08/01/2018   TRIG 187 (H) 08/01/2018  HDL 49 08/01/2018   LDLCALC 159 (H) 08/01/2018   Lab Results  Component Value Date   TSH 2.830 08/10/2018   TSH 5.550 (H) 08/01/2018    Therapeutic Level Labs: No results found for: LITHIUM No results found for: VALPROATE No components found for:  CBMZ  Current Medications: Current Outpatient Medications  Medication Sig Dispense Refill  . traZODone (DESYREL) 50 MG tablet Take 25-50 mg by mouth at bedtime as needed for sleep.    Marland Kitchen acetaminophen (TYLENOL) 500 MG tablet Take 500 mg by mouth every 6 (six) hours as needed.    . bisoprolol-hydrochlorothiazide (ZIAC) 5-6.25 MG tablet Take 1 tablet by mouth daily. 30 tablet 3  . Carboxymethylcellulose Sodium (THERATEARS OP) Apply to eye.    . Cholecalciferol (VITAMIN D3) 5000 units TABS Take 5,000 Units by mouth daily.    Marland Kitchen dicyclomine (BENTYL) 10 MG capsule Take 10 mg by mouth 4 (four) times daily -  before meals and at bedtime.    Marland Kitchen escitalopram (LEXAPRO) 20 MG tablet Take 1 tablet (20 mg total) by mouth daily. 90 tablet 0  . estradiol (ESTRACE) 0.1 MG/GM vaginal cream Use once a week as needed 42.5 g 1  . fluticasone (FLONASE) 50 MCG/ACT nasal  spray 1 SPRAY IN EACH NOSTRIL ONCE ADAY AS NEEDED 16 g 1  . hydrOXYzine (ATARAX/VISTARIL) 10 MG tablet Take 1 tablet (10 mg total) by mouth 3 (three) times daily as needed for anxiety. For severe anxiety attacks only (Patient taking differently: Take 5 mg by mouth 3 (three) times daily as needed for anxiety. For severe anxiety attacks only) 90 tablet 3  . Liniments (SALONPAS PAIN RELIEF PATCH EX) Apply 1 patch topically 2 (two) times daily.    Marland Kitchen lubiprostone (AMITIZA) 8 MCG capsule Take 1 capsule po bid prn ibs constipation. 60 capsule 3  . meloxicam (MOBIC) 7.5 MG tablet Take 7.5 mg by mouth daily.    . Multiple Vitamins-Minerals (CENTRUM SILVER 50+WOMEN) TABS Take by mouth.    . oxybutynin (DITROPAN-XL) 10 MG 24 hr tablet Take 1 tablet (10 mg total) by mouth daily. 30 tablet 2  . pantoprazole (PROTONIX) 40 MG tablet Take 1 tablet (40 mg total) by mouth daily. 90 tablet 1  . Polyethyl Glycol-Propyl Glycol (SYSTANE) 0.4-0.3 % SOLN Apply to eye.    . promethazine (PHENERGAN) 25 MG tablet Take 25 mg by mouth as needed.     Marland Kitchen rOPINIRole (REQUIP) 1 MG tablet TAKE ONE TABLET 3 TIMES DAILY AS NEEDED FOR RESTLESS LEGS 90 tablet 3  . simethicone (MYLICON) 80 MG chewable tablet Chew 80 mg by mouth every 6 (six) hours as needed for flatulence.    Marland Kitchen XIIDRA 5 % SOLN Apply to eye. Sample given by eye dr     No current facility-administered medications for this visit.     Musculoskeletal: Strength & Muscle Tone: UTA Gait & Station: UTA Patient leans: N/A  Psychiatric Specialty Exam: Review of Systems  Psychiatric/Behavioral: The patient is nervous/anxious.   All other systems reviewed and are negative.   There were no vitals taken for this visit.There is no height or weight on file to calculate BMI.  General Appearance: UTA  Eye Contact:  UTA  Speech:  Clear and Coherent  Volume:  Normal  Mood:  Anxious coping well  Affect:  UTA  Thought Process:  Goal Directed and Descriptions of Associations:  Intact  Orientation:  Full (Time, Place, and Person)  Thought Content: Logical   Suicidal Thoughts:  No  Homicidal Thoughts:  No  Memory:  Immediate;   Fair Recent;   Fair Remote;   Fair  Judgement:  Fair  Insight:  Fair  Psychomotor Activity:  UTA  Concentration:  Concentration: Fair and Attention Span: Fair  Recall:  [employer] of Knowledge: Fair  Language: Fair  Akathisia:  No  Handed:  Right  AIMS (if indicated): UTA  Assets:  Communication Skills Desire for Improvement Housing Social Support  ADL's:  Intact  Cognition: WNL  Sleep:  Fair   Screenings: Mini-Mental   Flowsheet Row Clinical Support from 03/07/2020 in [facility], [place] from 03/06/2019 in [facility], [place] from 03/02/2018 in [facility], [facility]  Total Score (max 30 points ) 30 30 27     PHQ2-9   [place] Visit from 06/10/2020 in [facility] Office Visit from 05/06/2020 in [place] Support from 03/07/2020 in [facility], [facility] Office Visit from 02/14/2020 in [place] Office Visit from 01/19/2020 in [facility], [facility]  PHQ-2 Total Score 0 0 3 0 0       Assessment and Plan: Kristen Pennington is an 87 year old female who has a history of GAD, MDD, history of psychosis, essential hypertension, chronic pain, OSA, hearing loss, bereavement was evaluated by phone today.  Patient is currently struggling with situational stressors, relationship struggles however overall is coping well.  Continue plan as noted below.  Plan GAD-stable Lexapro 20 mg p.o. daily Patient was referred for CBT in the past however she declined. Hydroxyzine 10 mg p.o. 3 times daily as needed for severe anxiety symptoms  Insomnia-stable Trazodone 25-50 mg p.o. nightly as needed Continue CPAP  History of delusional disorder-we  will monitor closely.  Follow-up in clinic in 3 to 4 weeks or sooner if needed.  I have spent atleast 18 minutes non face to face  with patient today. More than 50 % of the time was spent for preparing to see the patient ( e.g., review of test, records ), ordering medications and test ,psychoeducation and supportive psychotherapy and care coordination,as well as documenting clinical information in electronic health record. This note was generated in part or whole with voice recognition software. Voice recognition is usually quite accurate but there are transcription errors that can and very often do occur. I apologize for any typographical errors that were not detected and corrected.        Ursula Alert, MD 08/01/2020, 2:48 PM

## 2020-08-12 ENCOUNTER — Encounter: Payer: Self-pay | Admitting: Hospice and Palliative Medicine

## 2020-08-12 ENCOUNTER — Ambulatory Visit: Payer: Medicare HMO | Admitting: Hospice and Palliative Medicine

## 2020-08-12 VITALS — BP 170/80 | HR 69 | Temp 98.1°F | Resp 16 | Ht 65.0 in | Wt 149.2 lb

## 2020-08-12 DIAGNOSIS — S60312A Abrasion of left thumb, initial encounter: Secondary | ICD-10-CM

## 2020-08-12 DIAGNOSIS — F411 Generalized anxiety disorder: Secondary | ICD-10-CM | POA: Diagnosis not present

## 2020-08-12 DIAGNOSIS — R69 Illness, unspecified: Secondary | ICD-10-CM | POA: Diagnosis not present

## 2020-08-12 MED ORDER — DOXYCYCLINE HYCLATE 100 MG PO TABS: 100.0000 mg | ORAL_TABLET | Freq: Two times a day (BID) | ORAL | 0 refills | Status: DC

## 2020-08-12 NOTE — Progress Notes (Signed)
[facility] [place], [place] [address]  Internal MEDICINE  Office Visit Note  Patient Name: Kristen Pennington  [id number]  [id number]  Date of Service: 08/14/2020  Chief Complaint  Patient presents with  . finger infection    Left thump  . Hyperlipidemia  . Hypertension     HPI Pt is here for a sick visit. C/o left thumb pain--has been anxious lately due to issues with her neighbor, when she is anxious she has a habit of picking at her cuticles She has caused a small hematoma at the base of her left thumb nail Area is tender to touch and frequently bleeds, she has continued to pick at the area causing it to be sensitive and swollen  She has not been taking Atarax routinely--does feel that this helps with her anxiety and controlling her stress  Current Medication:  Outpatient Encounter Medications as of 08/12/2020  Medication Sig  . doxycycline (VIBRA-TABS) 100 MG tablet Take 1 tablet (100 mg total) by mouth 2 (two) times daily.  Marland Kitchen acetaminophen (TYLENOL) 500 MG tablet Take 500 mg by mouth every 6 (six) hours as needed.  . bisoprolol-hydrochlorothiazide (ZIAC) 5-6.25 MG tablet TAKE 1 TABLET BY MOUTH DAILY  . Carboxymethylcellulose Sodium (THERATEARS OP) Apply to eye.  . Cholecalciferol (VITAMIN D3) 5000 units TABS Take 5,000 Units by mouth daily.  Marland Kitchen dicyclomine (BENTYL) 10 MG capsule Take 10 mg by mouth 4 (four) times daily -  before meals and at bedtime.  Marland Kitchen escitalopram (LEXAPRO) 20 MG tablet Take 1 tablet (20 mg total) by mouth daily.  Marland Kitchen estradiol (ESTRACE) 0.1 MG/GM vaginal cream Use once a week as needed  . fluticasone (FLONASE) 50 MCG/ACT nasal spray 1 SPRAY IN EACH NOSTRIL ONCE ADAY AS NEEDED  . hydrOXYzine (ATARAX/VISTARIL) 10 MG tablet Take 1 tablet (10 mg total) by mouth 3 (three) times daily as needed for anxiety. For severe anxiety attacks only (Patient taking differently: Take 5 mg by mouth 3 (three) times daily as needed for anxiety. For  severe anxiety attacks only)  . Liniments (SALONPAS PAIN RELIEF PATCH EX) Apply 1 patch topically 2 (two) times daily.  Marland Kitchen lubiprostone (AMITIZA) 8 MCG capsule Take 1 capsule po bid prn ibs constipation.  . meloxicam (MOBIC) 7.5 MG tablet Take 1 tablet (7.5 mg total) by mouth daily.  . Multiple Vitamins-Minerals (CENTRUM SILVER 50+WOMEN) TABS Take by mouth.  . oxybutynin (DITROPAN-XL) 10 MG 24 hr tablet Take 1 tablet (10 mg total) by mouth daily.  . pantoprazole (PROTONIX) 40 MG tablet Take 1 tablet (40 mg total) by mouth daily.  Vladimir Faster Glycol-Propyl Glycol (SYSTANE) 0.4-0.3 % SOLN Apply to eye.  . promethazine (PHENERGAN) 25 MG tablet Take 25 mg by mouth as needed.   Marland Kitchen rOPINIRole (REQUIP) 1 MG tablet TAKE 1 TABLET BY MOUTH THREE TIMES DAILYAS NEEDED FOR RESTLESS LEGS  . simethicone (MYLICON) 80 MG chewable tablet Chew 80 mg by mouth every 6 (six) hours as needed for flatulence.  . traZODone (DESYREL) 50 MG tablet TAKE 1/2 TO 1 TABLET BY MOUTH AT BEDTIMEAS DIRECTED, STOP TAKING REMERON (MIRTAZAPINE)  . XIIDRA 5 % SOLN Apply to eye. Sample given by eye dr   No facility-administered encounter medications on file as of 08/12/2020.      Medical History: Past Medical History:  Diagnosis Date  . Arthritis   . Atrophic vaginitis 03/06/2013   Last Assessment & Plan:  She will plan to continue estradiol vaginal cream.  Prescription was rewritten  stipulating use of the generic product.   . Atypical migraine 03/06/2013   Last Assessment & Plan:  Since she rarely takes sumatriptan, we will discontinue it. Continue sparing use of OTC migraine products.   . Avitaminosis D 03/06/2013   Last Assessment & Plan:  Recheck vitamin D level. Plan to continue vitamin D supplementation.   . Ceratitis 08/01/2013   Last Assessment & Plan:  Because of the expense, she will discontinue Restasis. She will use over-the-counter moisturizing eyedrops and liquid gel.   . Colon polyp   . Combined fat and carbohydrate  induced hyperlipemia 03/06/2013   Last Assessment & Plan:  Recheck fasting lipids.   . 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   . Cystocele, midline 03/06/2013  . Demoralization and apathy 03/06/2013   Last Assessment & Plan:  Since she has been taking bupropion only once a day, I will write the prescription with the correct instructions and correct quantity. She has done well on this for years and she is encouraged to take it regularly   . Depression   . Environmental allergies   . Epistaxis   . Essential (primary) hypertension 05/17/2013   Last Assessment & Plan:  Her blood pressure is well-controlled. We will plan to continue hydrochlorothiazide and benazepril.  Both are generic and should be affordable on her health plan.   Marland Kitchen GERD (gastroesophageal reflux disease)   . Hemorrhoid   . Inflammation of sacroiliac joint ([place]) 03/06/2013   Last Assessment & Plan:  She is doing well on her present regimen of fentanyl patch supplemented with sparing use of tramadol/acetaminophen. We will continue that regimen.   . Sinusitis   . Sleep apnea    CPAP     Vital Signs: BP (!) 170/80   Pulse 69   Temp 98.1 F (36.7 C)   Resp 16   Ht 5\' 5"  (1.651 m)   Wt 149 lb 3.2 oz (67.7 kg)   SpO2 94%   BMI 24.83 kg/m    Review of Systems  Constitutional: Negative for chills, diaphoresis and fatigue.  HENT: Negative for ear pain, postnasal drip and sinus pressure.   Eyes: Negative for photophobia, discharge, redness, itching and visual disturbance.  Respiratory: Negative for cough, shortness of breath and wheezing.   Cardiovascular: Negative for chest pain, palpitations and leg swelling.  Gastrointestinal: Negative for abdominal pain, constipation, diarrhea, nausea and vomiting.  Genitourinary: Negative for dysuria and flank pain.  Musculoskeletal: Negative for arthralgias, back pain, gait  problem and neck pain.  Skin: Negative for color change.       Skin tear to left thumb cuticle, small hematoma  Allergic/Immunologic: Negative for environmental allergies and food allergies.  Neurological: Negative for dizziness and headaches.  Hematological: Does not bruise/bleed easily.  Psychiatric/Behavioral: Negative for agitation, behavioral problems (depression) and hallucinations.    Physical Exam Vitals reviewed.  Constitutional:      Appearance: Normal appearance. She is normal weight.  Cardiovascular:     Rate and Rhythm: Normal rate and regular rhythm.     Pulses: Normal pulses.     Heart sounds: Normal heart sounds.  Pulmonary:     Effort: Pulmonary effort is normal.     Breath sounds: Normal breath sounds.  Skin:    Comments: Skin tear to base of left thumb--cuticle, oozing blood, below fingernail skin is warm, tender and erythematous Small hematoma at  base of nail bed   Neurological:     General: No focal deficit present.     Mental Status: She is alert and oriented to person, place, and time. Mental status is at baseline.  Psychiatric:        Mood and Affect: Mood normal.        Behavior: Behavior normal.        Thought Content: Thought content normal.        Judgment: Judgment normal.   Assessment/Plan: 1. Abrasion of left thumb, initial encounter Skin tear due to picking of cuticles when anxious Will treat with doxycycline due to appearance and warmth of area Monitor closely - doxycycline (VIBRA-TABS) 100 MG tablet; Take 1 tablet (100 mg total) by mouth 2 (two) times daily.  Dispense: 20 tablet; Refill: 0  2. GAD (generalized anxiety disorder) Encouraged to avoid picking her nails and use Atarax as needed for anxiety  General Counseling: aisosa vielma understanding of the findings of todays visit and agrees with plan of treatment. I have discussed any further diagnostic evaluation that may be needed or ordered today. We also reviewed her medications  today. she has been encouraged to call the office with any questions or concerns that should arise related to todays visit.  Meds ordered this encounter  Medications  . doxycycline (VIBRA-TABS) 100 MG tablet    Sig: Take 1 tablet (100 mg total) by mouth 2 (two) times daily.    Dispense:  20 tablet    Refill:  0    Time spent: 25 Minutes Time spent includes review of chart, medications, test results and follow-up plan with the patient.  This patient was seen by Theodoro Grist AGNP-C in Collaboration with Dr Lavera Guise as a part of collaborative care agreement.  Tanna Furry [facility] Internal Medicine

## 2020-08-14 ENCOUNTER — Encounter: Payer: Self-pay | Admitting: Hospice and Palliative Medicine

## 2020-08-27 ENCOUNTER — Telehealth: Payer: Self-pay

## 2020-08-27 NOTE — Telephone Encounter (Signed)
Mailed patient records to [facility] on 08/27/2020 at [address] [place] [address] 

## 2020-08-29 ENCOUNTER — Encounter: Payer: Self-pay | Admitting: Licensed Clinical Social Worker

## 2020-08-29 ENCOUNTER — Telehealth (INDEPENDENT_AMBULATORY_CARE_PROVIDER_SITE_OTHER): Payer: Medicare HMO | Admitting: Psychiatry

## 2020-08-29 ENCOUNTER — Ambulatory Visit (INDEPENDENT_AMBULATORY_CARE_PROVIDER_SITE_OTHER): Payer: Medicare HMO | Admitting: Licensed Clinical Social Worker

## 2020-08-29 ENCOUNTER — Telehealth: Payer: Self-pay

## 2020-08-29 ENCOUNTER — Other Ambulatory Visit: Payer: Self-pay

## 2020-08-29 ENCOUNTER — Encounter: Payer: Self-pay | Admitting: Psychiatry

## 2020-08-29 DIAGNOSIS — F5105 Insomnia due to other mental disorder: Secondary | ICD-10-CM

## 2020-08-29 DIAGNOSIS — K5900 Constipation, unspecified: Secondary | ICD-10-CM | POA: Diagnosis not present

## 2020-08-29 DIAGNOSIS — Z008 Encounter for other general examination: Secondary | ICD-10-CM | POA: Diagnosis not present

## 2020-08-29 DIAGNOSIS — F411 Generalized anxiety disorder: Secondary | ICD-10-CM

## 2020-08-29 DIAGNOSIS — F330 Major depressive disorder, recurrent, mild: Secondary | ICD-10-CM

## 2020-08-29 DIAGNOSIS — R413 Other amnesia: Secondary | ICD-10-CM

## 2020-08-29 DIAGNOSIS — M545 Low back pain, unspecified: Secondary | ICD-10-CM | POA: Diagnosis not present

## 2020-08-29 DIAGNOSIS — G2581 Restless legs syndrome: Secondary | ICD-10-CM | POA: Diagnosis not present

## 2020-08-29 DIAGNOSIS — F22 Delusional disorders: Secondary | ICD-10-CM | POA: Diagnosis not present

## 2020-08-29 DIAGNOSIS — G8929 Other chronic pain: Secondary | ICD-10-CM | POA: Diagnosis not present

## 2020-08-29 DIAGNOSIS — K219 Gastro-esophageal reflux disease without esophagitis: Secondary | ICD-10-CM | POA: Diagnosis not present

## 2020-08-29 DIAGNOSIS — I10 Essential (primary) hypertension: Secondary | ICD-10-CM | POA: Diagnosis not present

## 2020-08-29 DIAGNOSIS — R69 Illness, unspecified: Secondary | ICD-10-CM | POA: Diagnosis not present

## 2020-08-29 DIAGNOSIS — I499 Cardiac arrhythmia, unspecified: Secondary | ICD-10-CM | POA: Diagnosis not present

## 2020-08-29 DIAGNOSIS — M1990 Unspecified osteoarthritis, unspecified site: Secondary | ICD-10-CM | POA: Diagnosis not present

## 2020-08-29 NOTE — Progress Notes (Signed)
Virtual Visit via Telephone Note  I connected with Kristen Pennington on 08/29/20 at  2:00 PM EST by telephone and verified that I am speaking with the correct person using two identifiers.  Participating Parties Patient Provider  Location: Patient: Home Provider: Home Office   I discussed the limitations, risks, security and privacy concerns of performing an evaluation and management service by telephone and the availability of in person appointments. I also discussed with the patient that there may be a patient responsible charge related to this service. The patient expressed understanding and agreed to proceed.  Comprehensive Clinical Assessment (CCA) Note  08/29/2020 Kristen Pennington [id number]  Chief Complaint:  Chief Complaint  Patient presents with  . Anxiety  . Depression   Visit Diagnosis:  GAD MDD, Recurrent Insomnia Memory Problem Hx of Delusions  CCA Screening, Triage and Referral (STR) STR has been completed on paper by the patient/patient's guardian.  (See scanned document in Chart Review)  CCCA Biopsychosocial Intake/Chief Complaint:  Pt presents as an 87 year old, widowed Caucasian female for assessment. Pt was referred by her psychiatrist and is seeking counseling for anxiety and depression. Pt is experiencing situational stressors in current living environment. Pt reported "I got so many things that have happened to me in the last 20 years. I lost my son last February. This is the anniversary of his death. I am having a bit of trouble with it. I live in a senior complex to be closer to my son. I did real well with the people here. A lot of other things have come up. I feel like I am going to explode". Pt explained that she and management of senior living community are at odds and attributes this to increase in sxs.  Current Symptoms/Problems: Anxiety, Depression, Grief/Loss, Family Conflict, Living Environment, Hx of Delusions   Patient Reported  Schizophrenia/Schizoaffective Diagnosis in Past: No (Hx of delusions regarding her computer being hacked.)   Strengths: Pt has some supports from friends in her community. Pt described ways she likes to give back to others.  Preferences: None reported  Abilities: Pt open to engaging in therapy and able to describe current stressors.   Type of Services Patient Feels are Needed: Individual Therapy and Medication Management   Initial Clinical Notes/Concerns: Pt tangential throughout assessment. Phone consistently kept ending calls every 15 minutes. Unable to complete assessment due to time constraints.   Mental Health Symptoms Depression:  Difficulty Concentrating; Fatigue; Sleep (too much or little); Change in energy/activity; Increase/decrease in appetite; Weight gain/loss; Tearfulness; Irritability; Hopelessness; Worthlessness (Pt attributed most of these symptoms in context of situational stress and "got me on too much medicine".)   Duration of Depressive symptoms: Greater than two weeks   Mania:  No data recorded  Anxiety:   Difficulty concentrating; Fatigue; Irritability; Sleep; Worrying   Psychosis:  Delusions (Pt asked if I was recording our conversation and said she was covering her mouth so others couldn't hear what she was saying. Pt fixated on management and behavior of others.)   Duration of Psychotic symptoms: -- (unknown)   Trauma:  None   Obsessions:  Intrusive/time consuming   Compulsions:  No data recorded  Inattention:  Forgetful; Does not follow instructions (not oppositional); Disorganized; Fails to pay attention/makes careless mistakes; Loses things; Poor follow-through on tasks   Hyperactivity/Impulsivity:  Talks excessively   Oppositional/Defiant Behaviors:  No data recorded  Emotional Irregularity:  Transient, stress-related paranoia/disassociation   Other Mood/Personality Symptoms:  Pt did not disclose SI or hx  of self-harming behavior.    Mental  Status Exam Appearance and self-care - Unable to assess due to telehealth over phone-call  Stature:  No data recorded  Weight:  No data recorded  Clothing:  No data recorded  Grooming:  No data recorded  Cosmetic use:  No data recorded  Posture/gait:  No data recorded  Motor activity:  No data recorded  Sensorium  Attention:  Confused   Concentration:  Anxiety interferes; Preoccupied; Scattered   Orientation:  X5   Recall/memory:  Defective in Short-term   Affect and Mood  Affect:  Anxious   Mood:  Anxious   Relating  Eye contact:  No data recorded - Unable to assess due to telehealth via phone-call  Facial expression:  No data recorded  Attitude toward examiner:  Cooperative; Dramatic   Thought and Language  Speech flow: Flight of Ideas; Pressured   Thought content:  No data recorded  Preoccupation:  Obsessions; Ruminations   Hallucinations:  Other (Comment) (Difficult to assess, did not answer question directly. Began talking about mental health problem of another senior in her community.)   Organization:  No data recorded  [employer] of Knowledge:  Average   Intelligence:  Average   Abstraction:  Functional   Judgement:  No data recorded  Reality Testing:  No data recorded  Insight:  Lacking   Decision Making:  No data recorded  Social Functioning  Social Maturity:  Responsible   Social Judgement:  Victimized   Stress  Stressors:  Family conflict; Grief/losses; Housing; Transitions   Coping Ability:  Resilient   Skill Deficits:  Interpersonal   Supports:  Friends/Service system; Support needed     Religion: Unable to assess due to time constraints, will complete at next scheduled appointment.    Leisure/Recreation:  Unable to assess due to time constraints, will complete at next scheduled appointment.     Exercise/Diet: Exercise/Diet Do You Have Any Trouble Sleeping?: Yes Explanation of Sleeping Difficulties: Pt reported  she was using CPAP, however it was taken away.   CCA Employment/Education Employment/Work Situation:   Unable to assess due to time constraints, will complete at next scheduled appointment.     Education:   Unable to assess due to time constraints, will complete at next scheduled appointment.    CCA Family/Childhood History Family and Relationship History: Family history Marital status: Widowed Widowed, when?: Pt reported her spouse died in 86. Does patient have children?: Yes How many children?: 2 How is patient's relationship with their children?: Pt reported her son died last year in September 07, 2019. Pt reported she would like a closer relationship with daughter, however s/ that she has been "under control of her husband for some years. He doesn't want her to spend time with me".  Childhood History:  Childhood History Does patient have siblings?: Yes Description of patient's current relationship with siblings: Pt reported many of her siblings are deceased, but has one brother still living.       CCA Substance Use Alcohol/Drug Use: Alcohol / Drug Use Pain Medications: SEE MAR Prescriptions: SEE MAR Over the Counter: SEE MAR                         ASAM's:  Six Dimensions of Multidimensional Assessment Unable to assess due to time constraints, will complete at next scheduled appointment.     Dimension 1:  Acute Intoxication and/or Withdrawal Potential:      Dimension 2:  Biomedical  Conditions and Complications:      Dimension 3:  Emotional, Behavioral, or Cognitive Conditions and Complications:     Dimension 4:  Readiness to Change:     Dimension 5:  Relapse, Continued use, or Continued Problem Potential:     Dimension 6:  Recovery/Living Environment:     ASAM Severity Score:    ASAM Recommended Level of Treatment:     Substance use Disorder (SUD)   Unable to assess due to time constraints, will complete at next scheduled appointment.      Recommendations for Services/Supports/Treatments: Recommendations for Services/Supports/Treatments Recommendations For Services/Supports/Treatments: Individual Therapy,Medication Management,Other (Comment) (May need assistance with housing due to current living environment complaints.)  DSM5 Diagnoses: Patient Active Problem List   Diagnosis Date Noted  . Memory problem 08/29/2020  . Chronic shoulder pain (Left) 06/03/2020  . Arthralgia of shoulder region (Left) 06/03/2020  . Osteoarthritis of shoulder (Left) 06/03/2020  . 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 Acute shoulder pain (Left) 05/30/2020  . Chronic low back pain (Midline) w/o sciatica 05/15/2020  . Spinal enthesopathy of lumbar region ([employer]) 05/15/2020  . 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 Long term prescription opiate use 04/29/2020  . Encounter for general adult medical examination with abnormal findings 03/20/2020  . Hyperkalemia 03/20/2020  . Hypercalcemia 03/20/2020  . Abnormal kidney function 03/20/2020  . Abnormal MRI, lumbar spine (01/08/2020) 02/14/2020  . Lumbar facet hypertrophy 02/14/2020  . MDD (major depressive disorder), recurrent, in full remission ([place]) 12/18/2019  . Osteoarthritis of hips (Bilateral) 11/20/2019  . Lumbar facet arthropathy 11/20/2019  . 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 Primary osteoarthritis involving multiple joints 11/20/2019  . OSA on CPAP 10/26/2019  . MDD (major depressive disorder), recurrent, in partial remission ([place]) 09/19/2019  . Bereavement 09/19/2019  . Chronic pain syndrome 08/21/2019  . Pharmacologic therapy 08/21/2019  . Disorder of skeletal system 08/21/2019  . Problems influencing health status 08/21/2019  . History of lumbar spinal fusion 08/21/2019  . Chronic hip pain (2ry area of Pain) (Bilateral) (R>L) 08/21/2019  . Lumbar facet syndrome (Bilateral) 08/21/2019  . Dysuria 07/02/2019  . Delusional disorder ([place] 03/08/2019  . MDD (major  depressive disorder), recurrent episode, mild ([place]) 01/18/2019  . 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 History of delusional disorder 01/18/2019  . Gastroesophageal reflux disease without esophagitis 11/27/2018  . Irritable bowel syndrome with constipation 11/27/2018  . Chronic low back pain (1ry area of Pain) (Bilateral) (R>L) w/o sciatica 08/17/2018  . Vitamin B12 deficiency 08/17/2018  . Elevated TSH 08/17/2018  . Urinary tract infection without hematuria 08/16/2017  . Generalized anxiety disorder 08/16/2017  . Primary generalized (osteo)arthritis 08/16/2017  . Melena 12/05/2015  . Blood in stool   . Benign neoplasm of transverse colon   . Benign neoplasm of cecum   . Ceratitis 08/01/2013  . Keratitis sicca ([place]) 08/01/2013  . Essential (primary) hypertension 05/17/2013  . Cystocele, midline 03/06/2013  . Demoralization and apathy 03/06/2013  . Atypical migraine 03/06/2013  . Hyperlipidemia 03/06/2013  . Jittery 03/06/2013  . 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 Atrophic vaginitis 03/06/2013  . Inflammation of sacroiliac joint ([place]) 03/06/2013  . Avitaminosis D 03/06/2013  . Allergic rhinitis due to pollen 03/06/2013  . Body mass index between 19-24, adult 03/06/2013  . Breast screening 03/06/2013  . 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 Screening for depression 03/06/2013    Patient Centered Plan: Patient is on the following Treatment Plan(s):  Unable to  complete due to time constraints, will complete at next scheduled appointment.   Follow Up Instructions:   I discussed the assessment and treatment plan with the patient. The patient was provided an opportunity to ask questions and all were answered. The patient agreed with the plan and demonstrated an understanding of the instructions.   The patient was advised to call back or seek an in-person evaluation if the symptoms worsen or if the condition fails to  improve as anticipated.  I provided 60 minutes of non-face-to-face time during this encounter.   Jalexia Lalli Wynelle Link, LCSW, LCAS

## 2020-08-29 NOTE — Telephone Encounter (Signed)
faxed and confirmed labwork orders sent to [facility], vitamin b12,  dx: r41.3

## 2020-08-29 NOTE — Progress Notes (Signed)
Virtual Visit via Telephone Note  I connected with Kristen Pennington on 08/29/20 at 10:30 AM EST by telephone and verified that I am speaking with the correct person using two identifiers.  Location Provider Location : [facility] Patient Location : Home  Participants: Patient , Provider   I discussed the limitations, risks, security and privacy concerns of performing an evaluation and management service by telephone and the availability of in person appointments. I also discussed with the patient that there may be a patient responsible charge related to this service. The patient expressed understanding and agreed to proceed.   I discussed the assessment and treatment plan with the patient. The patient was provided an opportunity to ask questions and all were answered. The patient agreed with the plan and demonstrated an understanding of the instructions.   The patient was advised to call back or seek an in-person evaluation if the symptoms worsen or if the condition fails to improve as anticipated.   [facility] MD OP Progress Note  08/29/2020 1:21 PM [name]  MRN:  [id number]  Chief Complaint:  Chief Complaint    Follow-up     HPI: Kristen Pennington is a 87 year old Caucasian female, widowed, lives in [place], has a history of GAD, MDD, delusional disorder, insomnia, hearing loss, vision loss, OSA, hypertension was evaluated by telemedicine today.  Patient today reports she is currently feeling depressed as well as anxious.  She reports she is likely grieving her son's death since its time of his death anniversary.  He passed away a year ago in 2022/09/03 due to COVID-19 infection.  Patient reports she has has been having trouble the past few days with her anxiety, feeling nervous.  Patient also has been picking on her thumb and recently was treated for abrasion likely from picking.  Patient also reports problems with concentration as well as memory.  She reports she often loses track of  what she was talking about in conversations.  Patient also reports misplacing things and looking all over for it.  This has been getting worse.  Patient appears to be alert and oriented to person place time and situation.  Patient was able to answer questions appropriately although she did have trouble with staying on track with her conversation and questions had to be repeated.  Patient could not do subtraction.  She however was able to spell the word 'world' backward and forward.  Patient reports the trazodone as helpful for her sleep.  She denies any suicidality, homicidality or perceptual disturbances.  Patient reports she is compliant on the Lexapro.  Denies side effects.  Patient denies any appetite changes.  Patient denies any recent weight loss.  Patient with psychosocial stressors of financial problems, also housing issues, is not very happy with her current living situation.  Patient denies any other concerns today.  Visit Diagnosis:    ICD-10-CM   1. Generalized anxiety disorder  F41.1   2. MDD (major depressive disorder), recurrent episode, mild ([place])  F33.0   3. Insomnia due to mental condition  F51.05   4. Memory problem  R41.3 TSH    Vitamin B12    Past Psychiatric History: I have reviewed past psychiatric history from my progress note on 07/25/2018.  Past trials of Klonopin, Abilify, Lexapro, mirtazapine.  Past Medical History:  Past Medical History:  Diagnosis Date  . Arthritis   . Atrophic vaginitis 03/06/2013   Last Assessment & Plan:  She will plan to continue estradiol vaginal cream.  Prescription  was rewritten stipulating use of the generic product.   . Atypical migraine 03/06/2013   Last Assessment & Plan:  Since she rarely takes sumatriptan, we will discontinue it. Continue sparing use of OTC migraine products.   . Avitaminosis D 03/06/2013   Last Assessment & Plan:  Recheck vitamin D level. Plan to continue vitamin D supplementation.   . Ceratitis 08/01/2013    Last Assessment & Plan:  Because of the expense, she will discontinue Restasis. She will use over-the-counter moisturizing eyedrops and liquid gel.   . Colon polyp   . 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   . 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   . Cystocele, midline 03/06/2013  . Demoralization and apathy 03/06/2013   Last Assessment & Plan:  Since she has been taking bupropion only once a day, I will write the prescription with the correct instructions and correct quantity. She has done well on this for years and she is encouraged to take it regularly   . Depression   . Environmental allergies   . Epistaxis   . Essential (primary) hypertension 05/17/2013   Last Assessment & Plan:  Her blood pressure is well-controlled. We will plan to continue hydrochlorothiazide and benazepril.  Both are generic and should be affordable on her health plan.   Marland Kitchen GERD (gastroesophageal reflux disease)   . Hemorrhoid   . Inflammation of sacroiliac joint ([place]) 03/06/2013   Last Assessment & Plan:  She is doing well on her present regimen of fentanyl patch supplemented with sparing use of tramadol/acetaminophen. We will continue that regimen.   . Sinusitis   . Sleep apnea    CPAP    Past Surgical History:  Procedure Laterality Date  . COLONOSCOPY  05-03-15  . COLONOSCOPY WITH PROPOFOL N/A 05/03/2015   Procedure: COLONOSCOPY WITH PROPOFOL;  Surgeon: Lucilla Lame, MD;  Location: [facility];  Service: Endoscopy;  Laterality: N/A;  CPAP  . DILATION AND CURETTAGE OF UTERUS  1970  . ENDOMETRIAL BIOPSY    . EYE SURGERY Right 2017  . POLYPECTOMY  05/03/2015   Procedure: POLYPECTOMY;  Surgeon: Lucilla Lame, MD;  Location: [facility];  Service: Endoscopy;;  . ROTATOR CUFF REPAIR  2007  . SPINE SURGERY       Family Psychiatric History: I have reviewed family psychiatric history from my progress note on 07/25/2018.  Family History:  Family History  Problem Relation Age of Onset  . Arthritis Mother   . Alzheimer's disease Father   . Heart disease Sister   . Cancer Brother   . Ovarian cancer Neg Hx   . Breast cancer Neg Hx   . Colon cancer Neg Hx   . Diabetes Neg Hx     Social History: I have reviewed social history from my progress note on 07/25/2018. Social History   Socioeconomic History  . Marital status: Widowed    Spouse name: Not on file  . Number of children: 2  . Years of education: Not on file  . Highest education level: Some college, no degree  Occupational History  . Not on file  Tobacco Use  . Smoking status: Never Smoker  . Smokeless tobacco: Never Used  Vaping Use  . Vaping Use: Never used  Substance and Sexual Activity  . Alcohol use: No    Alcohol/week:  0.0 standard drinks  . Drug use: No  . Sexual activity: Not Currently  Other Topics Concern  . Not on file  Social History Narrative  . Not on file   Social Determinants of Health   Financial Resource Strain: Not on file  Food Insecurity: Not on file  Transportation Needs: Not on file  Physical Activity: Not on file  Stress: Not on file  Social Connections: Not on file    Allergies: No Known Allergies  Metabolic Disorder Labs: Lab Results  Component Value Date   HGBA1C 5.4 08/01/2018   Lab Results  Component Value Date   PROLACTIN 8.1 08/01/2018   Lab Results  Component Value Date   CHOL 245 (H) 08/01/2018   TRIG 187 (H) 08/01/2018   HDL 49 08/01/2018   LDLCALC 159 (H) 08/01/2018   Lab Results  Component Value Date   TSH 2.830 08/10/2018   TSH 5.550 (H) 08/01/2018    Therapeutic Level Labs: No results found for: LITHIUM No results found for: VALPROATE No components found for:  CBMZ  Current Medications: Current Outpatient Medications  Medication Sig Dispense Refill  .  acetaminophen (TYLENOL) 500 MG tablet Take 500 mg by mouth every 6 (six) hours as needed.    . bisoprolol-hydrochlorothiazide (ZIAC) 5-6.25 MG tablet TAKE 1 TABLET BY MOUTH DAILY 30 tablet 3  . Carboxymethylcellulose Sodium (THERATEARS OP) Apply to eye.    . Cholecalciferol (VITAMIN D3) 5000 units TABS Take 5,000 Units by mouth daily.    Marland Kitchen dicyclomine (BENTYL) 10 MG capsule Take 10 mg by mouth 4 (four) times daily -  before meals and at bedtime.    Marland Kitchen doxycycline (VIBRA-TABS) 100 MG tablet Take 1 tablet (100 mg total) by mouth 2 (two) times daily. 20 tablet 0  . escitalopram (LEXAPRO) 20 MG tablet Take 1 tablet (20 mg total) by mouth daily. 90 tablet 0  . estradiol (ESTRACE) 0.1 MG/GM vaginal cream Use once a week as needed 42.5 g 1  . fluticasone (FLONASE) 50 MCG/ACT nasal spray 1 SPRAY IN EACH NOSTRIL ONCE ADAY AS NEEDED 16 g 1  . hydrOXYzine (ATARAX/VISTARIL) 10 MG tablet Take 1 tablet (10 mg total) by mouth 3 (three) times daily as needed for anxiety. For severe anxiety attacks only (Patient taking differently: Take 5 mg by mouth 3 (three) times daily as needed for anxiety. For severe anxiety attacks only) 90 tablet 3  . Liniments (SALONPAS PAIN RELIEF PATCH EX) Apply 1 patch topically 2 (two) times daily.    Marland Kitchen lubiprostone (AMITIZA) 8 MCG capsule Take 1 capsule po bid prn ibs constipation. 60 capsule 3  . meloxicam (MOBIC) 7.5 MG tablet Take 1 tablet (7.5 mg total) by mouth daily. 30 tablet 2  . Multiple Vitamins-Minerals (CENTRUM SILVER 50+WOMEN) TABS Take by mouth.    . oxybutynin (DITROPAN-XL) 10 MG 24 hr tablet Take 1 tablet (10 mg total) by mouth daily. 30 tablet 2  . pantoprazole (PROTONIX) 40 MG tablet Take 1 tablet (40 mg total) by mouth daily. 90 tablet 1  . Polyethyl Glycol-Propyl Glycol (SYSTANE) 0.4-0.3 % SOLN Apply to eye.    . promethazine (PHENERGAN) 25 MG tablet Take 25 mg by mouth as needed.     Marland Kitchen rOPINIRole (REQUIP) 1 MG tablet TAKE 1 TABLET BY MOUTH THREE TIMES DAILYAS NEEDED  FOR RESTLESS LEGS 90 tablet 3  . simethicone (MYLICON) 80 MG chewable tablet Chew 80 mg by mouth every 6 (six) hours as needed for flatulence.    . traZODone (  DESYREL) 50 MG tablet TAKE 1/2 TO 1 TABLET BY MOUTH AT BEDTIMEAS DIRECTED, STOP TAKING REMERON (MIRTAZAPINE) 30 tablet 2  . XIIDRA 5 % SOLN Apply to eye. Sample given by eye dr     No current facility-administered medications for this visit.     Musculoskeletal: Strength & Muscle Tone: UTA Gait & Station: UTA Patient leans: N/A  Psychiatric Specialty Exam: Review of Systems  Psychiatric/Behavioral: Positive for decreased concentration and dysphoric mood. The patient is nervous/anxious.   All other systems reviewed and are negative.   There were no vitals taken for this visit.There is no height or weight on file to calculate BMI.  General Appearance: UTA  Eye Contact:  UTA  Speech:  Normal Rate  Volume:  Normal  Mood:  Anxious and Dysphoric Grieving  Affect:  UTA  Thought Process:  Goal Directed and Descriptions of Associations: Intact  Orientation:  Full (Time, Place, and Person)  Thought Content: Logical   Suicidal Thoughts:  No  Homicidal Thoughts:  No  Memory:  Immediate;   Fair Recent;   Fair Remote;   limited appeared to have trouble with her train of thought during conversation today  Judgement:  Fair  Insight:  Fair  Psychomotor Activity:  UTA  Concentration:  Concentration: Fair and Attention Span: Fair  Recall:  [employer] of Knowledge: Fair  Language: Fair  Akathisia:  No  Handed:  Right  AIMS (if indicated): UTA  Assets:  Communication Skills Desire for Improvement Housing Social Support  ADL's:  Intact  Cognition: Impaired - unspecified  Sleep:  Fair   Screenings: Mini-Mental   Flowsheet Row Clinical Support from 03/07/2020 in [facility] [place] from 03/06/2019 in [facility] [place] from 03/02/2018 in [facility]  Total  Score (max 30 points ) 30 30 27     PHQ2-9   Flowsheet Row Video Visit from 08/29/2020 in [facility] Office Visit from 06/10/2020 in [facility] Office Visit from 05/06/2020 in [facility] Support from 03/07/2020 in [facility], [facility] Office Visit from 02/14/2020 in [facility]  PHQ-2 Total Score 1 0 0 3 0    Flowsheet Row Video Visit from 08/29/2020 in [facility] No Risk       Assessment and Plan: RHIANNE SOMAN is a 87 year old female who has a history of GAD, MDD, history of psychosis, essential hypertension, chronic pain, OSA, hearing loss, bereavement was evaluated by telemedicine today.  Patient is currently grieving the loss of her son who passed away a year ago.  Patient today reports memory changes which could be getting worse since the past few weeks.  Patient also with anxiety, skin picking will benefit from the following plan.  Plan GAD-unstable Continue Lexapro 20 mg p.o. daily Encouraged compliance with hydroxyzine 10 mg p.o. 3 times daily as needed for severe anxiety symptoms Patient is not interested in starting another medication at this time. However she is agreeable to starting psychotherapy sessions-I have referred her for CBT.  Insomnia-stable Trazodone 25-50 mg p.o. nightly as needed Continue CPAP  Memory problems-unknown if her memory changes are due to recent stressors include increase.  However given her age and high risk, will refer her for neurology evaluation. Will also order TSH-vitamin B12.  She will go to [facility] lab.  Follow-up in clinic in 1 month or sooner  if needed.  I have spent atleast 20 minutes non face to face with patient today. More than 50 % of the time was spent for preparing to see the patient ( e.g., review of test, records ), obtaining and to  review and separately obtained history , ordering medications and test ,psychoeducation and supportive psychotherapy and care coordination,as well as documenting clinical information in electronic health record. This note was generated in part or whole with voice recognition software. Voice recognition is usually quite accurate but there are transcription errors that can and very often do occur. I apologize for any typographical errors that were not detected and corrected.        Ursula Alert, MD 08/29/2020, 1:21 PM

## 2020-08-30 ENCOUNTER — Telehealth: Payer: Self-pay

## 2020-08-30 NOTE — Telephone Encounter (Signed)
[employer] called that pt bp was high I spoke with  Today 156/86 and advised her repeated again I will call her back and also make her appt Monday to see her no chest pain

## 2020-08-30 NOTE — Telephone Encounter (Signed)
Try to call pt again to check on bp reading no answer

## 2020-09-02 ENCOUNTER — Other Ambulatory Visit: Payer: Self-pay

## 2020-09-02 ENCOUNTER — Encounter: Payer: Self-pay | Admitting: Hospice and Palliative Medicine

## 2020-09-02 ENCOUNTER — Ambulatory Visit: Payer: Medicare HMO | Admitting: Hospice and Palliative Medicine

## 2020-09-02 VITALS — BP 119/79 | HR 80 | Temp 97.5°F | Resp 16 | Ht 65.0 in | Wt 147.4 lb

## 2020-09-02 DIAGNOSIS — E875 Hyperkalemia: Secondary | ICD-10-CM

## 2020-09-02 DIAGNOSIS — I10 Essential (primary) hypertension: Secondary | ICD-10-CM | POA: Diagnosis not present

## 2020-09-02 DIAGNOSIS — R69 Illness, unspecified: Secondary | ICD-10-CM | POA: Diagnosis not present

## 2020-09-02 DIAGNOSIS — F411 Generalized anxiety disorder: Secondary | ICD-10-CM | POA: Diagnosis not present

## 2020-09-02 DIAGNOSIS — R5383 Other fatigue: Secondary | ICD-10-CM

## 2020-09-02 NOTE — Progress Notes (Signed)
[facility] [place], [place] [address]  Internal MEDICINE  Office Visit Note  Patient Name: Kristen Pennington  [id number]  [id number]  Date of Service: 09/03/2020  Chief Complaint  Patient presents with  . Acute Visit  . Hypertension    elevated     HPI Pt is here for a sick visit. Had visit with nurse from insurance company last week come to her home During home visit, nurse contacted our office to let us know Ms. Pinera's blood pressure was elevated Initially SBP 190's, recheck SBP 160's At that time she was not complaining of chest pain, headache or visual disturbances Today her BP is much better She does not routinely check her BP at home Has been going through quite a bit of personal issues--followed by psychiatry and has just been set up to see a psychotherapist to help her manage her anxiety and work through some of the issues she has been dealing with personally Increased stress and anxiety have not been affecting her sleep, continues to sleep well at night No recent changes in her appetite and no issues with constipation  Current Medication:  Outpatient Encounter Medications as of 09/02/2020  Medication Sig  . acetaminophen (TYLENOL) 500 MG tablet Take 500 mg by mouth every 6 (six) hours as needed.  . bisoprolol-hydrochlorothiazide (ZIAC) 5-6.25 MG tablet TAKE 1 TABLET BY MOUTH DAILY  . Carboxymethylcellulose Sodium (THERATEARS OP) Apply to eye.  . Cholecalciferol (VITAMIN D3) 5000 units TABS Take 5,000 Units by mouth daily.  Marland Kitchen dicyclomine (BENTYL) 10 MG capsule Take 10 mg by mouth 4 (four) times daily -  before meals and at bedtime.  Marland Kitchen doxycycline (VIBRA-TABS) 100 MG tablet Take 1 tablet (100 mg total) by mouth 2 (two) times daily.  Marland Kitchen escitalopram (LEXAPRO) 20 MG tablet Take 1 tablet (20 mg total) by mouth daily.  Marland Kitchen estradiol (ESTRACE) 0.1 MG/GM vaginal cream Use once a week as needed  . fluticasone (FLONASE) 50 MCG/ACT nasal spray 1  SPRAY IN EACH NOSTRIL ONCE ADAY AS NEEDED  . hydrOXYzine (ATARAX/VISTARIL) 10 MG tablet Take 1 tablet (10 mg total) by mouth 3 (three) times daily as needed for anxiety. For severe anxiety attacks only (Patient taking differently: Take 5 mg by mouth 3 (three) times daily as needed for anxiety. For severe anxiety attacks only)  . Liniments (SALONPAS PAIN RELIEF PATCH EX) Apply 1 patch topically 2 (two) times daily.  Marland Kitchen lubiprostone (AMITIZA) 8 MCG capsule Take 1 capsule po bid prn ibs constipation.  . meloxicam (MOBIC) 7.5 MG tablet Take 1 tablet (7.5 mg total) by mouth daily.  . Multiple Vitamins-Minerals (CENTRUM SILVER 50+WOMEN) TABS Take by mouth.  . oxybutynin (DITROPAN-XL) 10 MG 24 hr tablet Take 1 tablet (10 mg total) by mouth daily.  . pantoprazole (PROTONIX) 40 MG tablet Take 1 tablet (40 mg total) by mouth daily.  Vladimir Faster Glycol-Propyl Glycol (SYSTANE) 0.4-0.3 % SOLN Apply to eye.  . promethazine (PHENERGAN) 25 MG tablet Take 25 mg by mouth as needed.   Marland Kitchen rOPINIRole (REQUIP) 1 MG tablet TAKE 1 TABLET BY MOUTH THREE TIMES DAILYAS NEEDED FOR RESTLESS LEGS  . simethicone (MYLICON) 80 MG chewable tablet Chew 80 mg by mouth every 6 (six) hours as needed for flatulence.  . traZODone (DESYREL) 50 MG tablet TAKE 1/2 TO 1 TABLET BY MOUTH AT BEDTIMEAS DIRECTED, STOP TAKING REMERON (MIRTAZAPINE)  . XIIDRA 5 % SOLN Apply to eye. Sample given by eye dr   No facility-administered encounter  medications on file as of 09/02/2020.      Medical History: Past Medical History:  Diagnosis Date  . Arthritis   . Atrophic vaginitis 03/06/2013   Last Assessment & Plan:  She will plan to continue estradiol vaginal cream.  Prescription was rewritten stipulating use of the generic product.   . Atypical migraine 03/06/2013   Last Assessment & Plan:  Since she rarely takes sumatriptan, we will discontinue it. Continue sparing use of OTC migraine products.   . Avitaminosis D 03/06/2013   Last Assessment & Plan:   Recheck vitamin D level. Plan to continue vitamin D supplementation.   . Ceratitis 08/01/2013   Last Assessment & Plan:  Because of the expense, she will discontinue Restasis. She will use over-the-counter moisturizing eyedrops and liquid gel.   . Colon polyp   . 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   . 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   . Cystocele, midline 03/06/2013  . Demoralization and apathy 03/06/2013   Last Assessment & Plan:  Since she has been taking bupropion only once a day, I will write the prescription with the correct instructions and correct quantity. She has done well on this for years and she is encouraged to take it regularly   . Depression   . Environmental allergies   . Epistaxis   . Essential (primary) hypertension 05/17/2013   Last Assessment & Plan:  Her blood pressure is well-controlled. We will plan to continue hydrochlorothiazide and benazepril.  Both are generic and should be affordable on her health plan.   Marland Kitchen GERD (gastroesophageal reflux disease)   . Hemorrhoid   . Inflammation of sacroiliac joint ([place]) 03/06/2013   Last Assessment & Plan:  She is doing well on her present regimen of fentanyl patch supplemented with sparing use of tramadol/acetaminophen. We will continue that regimen.   . Sinusitis   . Sleep apnea    CPAP     Vital Signs: BP 119/79   Pulse 80   Temp (!) 97.5 F (36.4 C)   Resp 16   Ht 5\' 5"  (1.651 m)   Wt 147 lb 6.4 oz (66.9 kg)   SpO2 95%   BMI 24.53 kg/m    Review of Systems  Constitutional: Negative for chills, diaphoresis and fatigue.  HENT: Negative for ear pain, postnasal drip and sinus pressure.   Eyes: Negative for photophobia, discharge, redness, itching and visual disturbance.  Respiratory: Negative for cough, shortness of breath  and wheezing.   Cardiovascular: Negative for chest pain, palpitations and leg swelling.  Gastrointestinal: Negative for abdominal pain, constipation, diarrhea, nausea and vomiting.  Genitourinary: Negative for dysuria and flank pain.  Musculoskeletal: Negative for arthralgias, back pain, gait problem and neck pain.  Skin: Negative for color change.  Allergic/Immunologic: Negative for environmental allergies and food allergies.  Neurological: Negative for dizziness and headaches.  Hematological: Does not bruise/bleed easily.  Psychiatric/Behavioral: Negative for agitation, behavioral problems (depression) and hallucinations. The patient is nervous/anxious.     Physical Exam Vitals reviewed.  Constitutional:      Appearance: Normal appearance. She is normal weight.  Cardiovascular:     Rate and Rhythm: Normal rate and regular rhythm.     Pulses: Normal pulses.     Heart sounds: Normal heart sounds.  Pulmonary:     Effort: Pulmonary  effort is normal.     Breath sounds: Normal breath sounds.  Abdominal:     General: Abdomen is flat.     Palpations: Abdomen is soft.  Musculoskeletal:        General: Normal range of motion.     Cervical back: Normal range of motion.  Skin:    General: Skin is warm.  Neurological:     General: No focal deficit present.     Mental Status: She is alert and oriented to person, place, and time. Mental status is at baseline.  Psychiatric:        Mood and Affect: Mood normal.        Behavior: Behavior normal.        Thought Content: Thought content normal.        Judgment: Judgment normal.    Assessment/Plan: 1. Essential hypertension BP and HR well controlled today Encouraged to start monitoring BP at home and she may come into the office for random BP checks for closer monitoring  2. Hyperkalemia Will update labs and treat accordingly - Comprehensive Metabolic Panel (CMET)  3. Hypercalcemia Will update labs and treat accordingly -  Comprehensive Metabolic Panel (CMET)  4. GAD (generalized anxiety disorder) Followed by psychiatry and now also followed by psychotherapist  5. Other fatigue - CBC w/Diff/Platelet - Lipid Panel With LDL/HDL Ratio - TSH + free T4  General Counseling: Kristen Pennington understanding of the findings of todays visit and agrees with plan of treatment. I have discussed any further diagnostic evaluation that may be needed or ordered today. We also reviewed her medications today. she has been encouraged to call the office with any questions or concerns that should arise related to todays visit.   Orders Placed This Encounter  Procedures  . CBC w/Diff/Platelet  . Comprehensive Metabolic Panel (CMET)  . Lipid Panel With LDL/HDL Ratio  . TSH + free T4    Time spent: 30 Minutes  This patient was seen by Theodoro Grist AGNP-C in Collaboration with Dr Lavera Guise as a part of collaborative care agreement.  Tanna Furry [facility] Internal Medicine

## 2020-09-03 ENCOUNTER — Encounter: Payer: Self-pay | Admitting: Hospice and Palliative Medicine

## 2020-09-04 ENCOUNTER — Encounter: Payer: Self-pay | Admitting: Licensed Clinical Social Worker

## 2020-09-04 ENCOUNTER — Ambulatory Visit (INDEPENDENT_AMBULATORY_CARE_PROVIDER_SITE_OTHER): Payer: Medicare HMO | Admitting: Licensed Clinical Social Worker

## 2020-09-04 ENCOUNTER — Other Ambulatory Visit: Payer: Self-pay

## 2020-09-04 DIAGNOSIS — F330 Major depressive disorder, recurrent, mild: Secondary | ICD-10-CM | POA: Diagnosis not present

## 2020-09-04 DIAGNOSIS — R413 Other amnesia: Secondary | ICD-10-CM

## 2020-09-04 DIAGNOSIS — F411 Generalized anxiety disorder: Secondary | ICD-10-CM

## 2020-09-04 DIAGNOSIS — Z634 Disappearance and death of family member: Secondary | ICD-10-CM | POA: Diagnosis not present

## 2020-09-04 DIAGNOSIS — F22 Delusional disorders: Secondary | ICD-10-CM

## 2020-09-04 DIAGNOSIS — F5105 Insomnia due to other mental disorder: Secondary | ICD-10-CM | POA: Diagnosis not present

## 2020-09-04 DIAGNOSIS — R69 Illness, unspecified: Secondary | ICD-10-CM | POA: Diagnosis not present

## 2020-09-04 NOTE — Progress Notes (Signed)
Virtual Visit via Telephone Note  I connected with Kristen Pennington on 09/04/20 at  8:00 AM EST by telephone and verified that I am speaking with the correct person using two identifiers.  Participating Parties Patient Provider  Location: Patient: Home Provider: Home Office   I discussed the limitations, risks, security and privacy concerns of performing an evaluation and management service by telephone and the availability of in person appointments. I also discussed with the patient that there may be a patient responsible charge related to this service. The patient expressed understanding and agreed to proceed.  Comprehensive Clinical Assessment (CCA) Note - Completed  09/04/2020 Kristen Pennington [id number]  Chief Complaint:  Chief Complaint  Patient presents with  . Depression  . Anxiety   Visit Diagnosis: GAD MDD, Recurrent Insomnia Memory Problem  Hx of Delusions  CCA Biopsychosocial Intake/Chief Complaint:  Pt presents as an 87 year old, widowed Caucasian female for assessment. Pt was referred by her psychiatrist and is seeking counseling for anxiety and depression. Pt is experiencing situational stressors in current living environment. Pt reported "I got so many things that have happened to me in the last 20 years. I lost my son last February. This is the anniversary of his death. I am having a bit of trouble with it. I live in a senior complex to be closer to my son. I did real well with the people here. A lot of other things have come up. I feel like I am going to explode". Pt explained that she and management of senior living community are at odds and attributes this to increase in sxs. Pt reported hx of therapy and psychiatric care since the 1980s. Pt reported last therapist "lied" to her about ability to see notes regarding psychotherapy sessions.  Current Symptoms/Problems: Anxiety, Depression, Grief/Loss, Family Conflict, Living Environment, Hx of  Delusions   Patient Reported Schizophrenia/Schizoaffective Diagnosis in Past: No (Pt hx of Delusions)   Strengths: Pt has some supports from friends in her community. Pt described ways she likes to give back to others.  Preferences: None reported  Abilities: Pt open to engaging in therapy and able to describe current stressors.   Type of Services Patient Feels are Needed: Individual Therapy and Medication Management   Initial Clinical Notes/Concerns: During first session with patient for initial CCA on 08/29/20, patient was tangential throughout assessment. Phone consistently kept ending calls every 15 minutes. Unable to complete assessment due to time constraints. On 09/04/20 therapist met with patient for second session to complete assessment and treatment plan.   Mental Health Symptoms Depression:  Difficulty Concentrating; Fatigue; Sleep (too much or little); Change in energy/activity; Increase/decrease in appetite; Weight gain/loss; Tearfulness; Irritability; Hopelessness; Worthlessness (Pt attributed most of these symptoms in context of situational stress and "got me on too much medicine".)   Duration of Depressive symptoms: Greater than two weeks   Mania:  Change in energy/activity; Racing thoughts   Anxiety:   Difficulty concentrating; Fatigue; Irritability; Sleep; Worrying   Psychosis:  Delusions (Pt asked if I was recording our conversation and said she was covering her mouth so others couldn't hear what she was saying. Pt fixated on management and behavior of others.)   Duration of Psychotic symptoms: Greater than six months (Hx of delusions around computer being hacked. Referenced this again in current assessment.)   Trauma:  None   Obsessions:  Intrusive/time consuming; Cause anxiety   Compulsions:  Absent insight/delusional   Inattention:  Forgetful; Does not follow instructions (not oppositional); Disorganized;  Fails to pay attention/makes careless mistakes; Loses  things; Poor follow-through on tasks   Hyperactivity/Impulsivity:  Talks excessively   Oppositional/Defiant Behaviors:  None   Emotional Irregularity:  Transient, stress-related paranoia/disassociation   Other Mood/Personality Symptoms:  Pt reported "I would not" act on suicide. Pt reported some family/friends hx of suicides and saw the pain that inflicted on others. Pt reported "I wouldn't want to do them that way". Pt denied current SI/HI or hx of self-harming/aggressive behavior.    Mental Status Exam Appearance and self-care - unable to observe, assessment over the phone  Stature:  No data recorded  Weight:  No data recorded  Clothing:  No data recorded  Grooming:  No data recorded  Cosmetic use:  No data recorded  Posture/gait:  No data recorded  Motor activity:  No data recorded  Sensorium  Attention:  Confused   Concentration:  Anxiety interferes; Preoccupied; Scattered   Orientation:  X5   Recall/memory:  Defective in Short-term; Defective in Immediate   Affect and Mood  Affect:  Anxious   Mood:  Anxious   Relating  Eye contact:  No data recorded  Facial expression:  No data recorded  Attitude toward examiner:  Cooperative; Dramatic   Thought and Language  Speech flow: Flight of Ideas; Pressured   Thought content:  Delusions; Persecutions (Pt statements indicate concerns of persecution by management of her complex. Pt tangentially reported "I want that off my record. If they think I am guilty they need to have a hearing. I need to clear my name. I am not guilty of what she charged me for".)   Preoccupation:  Obsessions; Ruminations   Hallucinations:  Other (Comment) (Difficult to assess, did not answer question directly. Began talking about mental health problem of another senior in her community.)   Organization:  No data recorded Tangential, [place] of Knowledge:  Average   Intelligence:  Average   Abstraction:   Functional   Judgement:  Impaired   Reality Testing:  No data recorded  Insight:  Lacking   Decision Making:  Confused   Social Functioning  Social Maturity:  Responsible   Social Judgement:  Victimized   Stress  Stressors:  Family conflict; Grief/losses; Housing; Transitions   Coping Ability:  Resilient; Deficient supports   Skill Deficits:  Interpersonal; Communication   Supports:  Friends/Service system; Support needed (Pt reported current lack of family support s/ "they didn't believe me. I know the hacker was there. It hurts me that no one believed me".)     Religion: Religion/Spirituality Are You A Religious Person?: Yes (Pt reported "but I am not fanatic about religion".)  Leisure/Recreation: Leisure / Recreation Do You Have Hobbies?: Yes Leisure and Hobbies: Gardening and socializing with members of her community. Pt reported long hx of being of service to others.  Exercise/Diet: Exercise/Diet Do You Exercise?: Yes What Type of Exercise Do You Do?: Run/Walk How Many Times a Week Do You Exercise?: 1-3 times a week Have You Gained or Lost A Significant Amount of Weight in the Past Six Months?: No Do You Follow a Special Diet?: No Do You Have Any Trouble Sleeping?: Yes Explanation of Sleeping Difficulties: Pt reported she was using CPAP, however it was taken away.   CCA Employment/Education Employment/Work Situation: Employment / Work Situation Employment situation: Retired Where was the patient employed at that time?: Pt reported she first started working at age 47. 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 Pt reported she retired at age 30 "after my husband died". Has patient ever been in the [employer]?: No  Education: Education Is Patient Currently Attending School?: No Last Grade Completed: 12 Did You Graduate From [employer]?: Yes Did You Attend College?: Yes What Type of  College Degree Do you Have?: Took classes to learn how to use computers at [employer] Did [place]?: No Did You Have An Individualized Education Program (IIEP): No Did You Have Any Difficulty At School?: No   CCA Family/Childhood History Family and Relationship History: Family history Marital status: Widowed Widowed, when?: Pt reported her spouse died in 65. Are you sexually active?: No Does patient have children?: Yes How many children?: 2 How is patient's relationship with their children?: Pt reported her son died last year in 09/05/19. Pt reported she would like a closer relationship with daughter, however s/ that daughter has been "under control of her husband for some years. He doesn't want her to spend time with me".  Childhood History:  Childhood History By whom was/is the patient raised?: Both parents Additional childhood history information: Pt reported "I had a good family life". Description of patient's relationship with caregiver when they were a child: Pt reported "I don't know of anything that went wrong with them". Patient's description of current relationship with people who raised him/her: Parents are deceased. Pt reported she and older sister cared for them as adults. How were you disciplined when you got in trouble as a child/adolescent?: Pt reported "I was punished by a whipping. Nothing bitter about it". Pt reported father would mostly "sit me down and talk to me". Does patient have siblings?: Yes Number of Siblings: 4 Description of patient's current relationship with siblings: Pt reported most siblings are deceased, but has one brother still living. Did patient suffer any verbal/emotional/physical/sexual abuse as a child?: No Did patient suffer from severe childhood neglect?: No Has patient ever been sexually abused/assaulted/raped as an adolescent or adult?: No Was the patient ever a victim of a crime or a disaster?: No Witnessed domestic  violence?: No Has patient been affected by domestic violence as an adult?: No      CCA Substance Use Alcohol/Drug Use: Alcohol / Drug Use Pain Medications: SEE MAR Prescriptions: SEE MAR Over the Counter: SEE MAR History of alcohol / drug use?: No history of alcohol / drug abuse                            Recommendations for Services/Supports/Treatments: Recommendations for Services/Supports/Treatments Recommendations For Services/Supports/Treatments: Individual Therapy,Medication Management,Other (Comment) (May need assistance with housing due to current living environment complaints.)  DSM5 Diagnoses: Patient Active Problem List   Diagnosis Date Noted  . Memory problem 08/29/2020  . Chronic shoulder pain (Left) 06/03/2020  . Arthralgia of shoulder region (Left) 06/03/2020  . Osteoarthritis of shoulder (Left) 06/03/2020  . 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 Acute shoulder pain (Left) 05/30/2020  . Chronic low back pain (Midline) w/o sciatica 05/15/2020  . Spinal enthesopathy of lumbar region ([place]) 05/15/2020  . 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 Long term prescription opiate use 04/29/2020  . Encounter for general adult medical examination with abnormal findings 03/20/2020  . Hyperkalemia 03/20/2020  . Hypercalcemia 03/20/2020  . Abnormal kidney function 03/20/2020  . Abnormal MRI, lumbar spine (01/08/2020) 02/14/2020  . Lumbar facet hypertrophy 02/14/2020  . MDD (major depressive disorder), recurrent, in  full remission ([place]) 12/18/2019  . Osteoarthritis of hips (Bilateral) 11/20/2019  . Lumbar facet arthropathy 11/20/2019  . 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 Primary osteoarthritis involving multiple joints 11/20/2019  . OSA on CPAP 10/26/2019  . MDD (major depressive disorder), recurrent, in partial remission ([place]) 09/19/2019  . Bereavement 09/19/2019  . Chronic pain syndrome 08/21/2019  . Pharmacologic therapy 08/21/2019  .  Disorder of skeletal system 08/21/2019  . Problems influencing health status 08/21/2019  . History of lumbar spinal fusion 08/21/2019  . Chronic hip pain (2ry area of Pain) (Bilateral) (R>L) 08/21/2019  . Lumbar facet syndrome (Bilateral) 08/21/2019  . Dysuria 07/02/2019  . Delusional disorder ([place]) 03/08/2019  . MDD (major depressive disorder), recurrent episode, mild ([place]) 01/18/2019  . 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 History of delusional disorder 01/18/2019  . Gastroesophageal reflux disease without esophagitis 11/27/2018  . Irritable bowel syndrome with constipation 11/27/2018  . Chronic low back pain (1ry area of Pain) (Bilateral) (R>L) w/o sciatica 08/17/2018  . Vitamin B12 deficiency 08/17/2018  . Elevated TSH 08/17/2018  . Urinary tract infection without hematuria 08/16/2017  . Generalized anxiety disorder 08/16/2017  . Primary generalized (osteo)arthritis 08/16/2017  . Melena 12/05/2015  . Blood in stool   . Benign neoplasm of transverse colon   . Benign neoplasm of cecum   . Ceratitis 08/01/2013  . Keratitis sicca ([place]) 08/01/2013  . Essential (primary) hypertension 05/17/2013  . Cystocele, midline 03/06/2013  . Demoralization and apathy 03/06/2013  . Atypical migraine 03/06/2013  . Hyperlipidemia 03/06/2013  . Jittery 03/06/2013  . 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 Atrophic vaginitis 03/06/2013  . Inflammation of sacroiliac joint ([place]) 03/06/2013  . Avitaminosis D 03/06/2013  . Allergic rhinitis due to pollen 03/06/2013  . Body mass index between 19-24, adult 03/06/2013  . Breast screening 03/06/2013  . 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 Screening for depression 03/06/2013    Patient Centered Plan: Patient is on the following Treatment Plan(s):  Anxiety and Depression  Follow Up Instructions:  I discussed the assessment and treatment plan with the patient. The patient was provided an  opportunity to ask questions and all were answered. The patient agreed with the plan and demonstrated an understanding of the instructions.   The patient was advised to call back or seek an in-person evaluation if the symptoms worsen or if the condition fails to improve as anticipated.  I provided 60 minutes of non-face-to-face time during this encounter.   Kristen Pennington Wynelle Link, LCSW, LCAS

## 2020-09-13 ENCOUNTER — Telehealth: Payer: Self-pay | Admitting: Licensed Clinical Social Worker

## 2020-09-13 NOTE — Telephone Encounter (Signed)
Therapist left VM for patient as a return call. Pt left a voicemail message for this therapist asking if therapist was working with social security or social services offices and if so to call her back. Therapist left contact information for both offices and clarified role as therapist from [facility] and reminded patient she has a virtual by phone appointment scheduled on 3/8 at 1pm.

## 2020-09-17 ENCOUNTER — Encounter: Payer: Self-pay | Admitting: Licensed Clinical Social Worker

## 2020-09-17 ENCOUNTER — Ambulatory Visit (INDEPENDENT_AMBULATORY_CARE_PROVIDER_SITE_OTHER): Payer: Medicare HMO | Admitting: Licensed Clinical Social Worker

## 2020-09-17 ENCOUNTER — Other Ambulatory Visit: Payer: Self-pay

## 2020-09-17 DIAGNOSIS — F330 Major depressive disorder, recurrent, mild: Secondary | ICD-10-CM

## 2020-09-17 DIAGNOSIS — F5105 Insomnia due to other mental disorder: Secondary | ICD-10-CM

## 2020-09-17 DIAGNOSIS — R413 Other amnesia: Secondary | ICD-10-CM

## 2020-09-17 DIAGNOSIS — F411 Generalized anxiety disorder: Secondary | ICD-10-CM | POA: Diagnosis not present

## 2020-09-17 DIAGNOSIS — F22 Delusional disorders: Secondary | ICD-10-CM

## 2020-09-17 NOTE — Progress Notes (Signed)
Virtual Visit via Telephone Note  I connected with Kristen Pennington on 09/17/20 at  1:00 PM EST by telephone and verified that I am speaking with the correct person using two identifiers.  Participating Parties Patient Provider  Location: Patient: Home Provider: Home Office   I discussed the limitations, risks, security and privacy concerns of performing an evaluation and management service by telephone and the availability of in person appointments. I also discussed with the patient that there may be a patient responsible charge related to this service. The patient expressed understanding and agreed to proceed.  THERAPY PROGRESS NOTE  Session Time: 5 Minutes  Participation Level: Active  Behavioral Response: AlertAnxious  Type of Therapy: Individual Therapy  Treatment Goals addressed: Anxiety and Coping  Interventions: CBT  Summary: Kristen Pennington is a 87 y.o. female who presents with anxiety and depression sxs. Pt reported feeling "absolutely awful" recently and attributed this to significant stressors around finances, increasing confusion/memory problems, and lack of family support. Pt reported experiencing "mix up" in payment of medical bills and difficulty remembering if she took her medications or not. Pt reported difficulty with vision in which she lays out all her paperwork on the floor so she can "see it better" and acknowledged this doesn't make sense to others. Pt reported difficulty sleeping most nights, sometimes only getting about 4 hours of sleep and others "staying up 24 hours" cleaning and organizing her apartment. Pt reported despite all of this she considers herself "in excellent condition for my age" and denied difficulty completing basic ADLs independently. Pt reported she hasn't experienced any falls since last year and uses walker/cane when leaving her apartment. Pt reported she can get around "grabbing onto things" and able to ask for assistance "if I am going  to exercise". Pt continues to fixate on being at odds with management, but hopeful things will get better after speaking to the district level Freight forwarder. Pt also made references to "the computer hacker" which she has been having problems with for several years, although family members "did not believe me and thought I was the problem". Pt reported she is less distressed over this since having her computer serviced in which everything was "wiped clean".  Suicidal/Homicidal: No  Therapist Response: Therapist and patient explored current stressors and attempts to cope. Therapist and patient discussed ADLs due to report in increased confusion and medication compliance concerns. Therapist validated patient feelings/concerns. Therapist clarified role as patient has made numerous calls to this therapist since last session. Pt reported she understands and "didn't have your number saved so I was just calling back numbers on my phone to see who it was connected to".  Plan: Return again in 1 week.  Diagnosis: Axis I: Generalized Anxiety Disorder and MDD, Recurrent, Mild, Insomnia, Memory Loss, Hx of Delusions    Axis II: N/A  Kristen Igo, LCSW, LCAS 09/17/2020

## 2020-09-26 ENCOUNTER — Telehealth (INDEPENDENT_AMBULATORY_CARE_PROVIDER_SITE_OTHER): Payer: Medicare HMO | Admitting: Psychiatry

## 2020-09-26 ENCOUNTER — Ambulatory Visit (INDEPENDENT_AMBULATORY_CARE_PROVIDER_SITE_OTHER): Payer: Medicare HMO | Admitting: Licensed Clinical Social Worker

## 2020-09-26 ENCOUNTER — Encounter: Payer: Self-pay | Admitting: Psychiatry

## 2020-09-26 ENCOUNTER — Other Ambulatory Visit: Payer: Self-pay

## 2020-09-26 ENCOUNTER — Encounter: Payer: Self-pay | Admitting: Licensed Clinical Social Worker

## 2020-09-26 DIAGNOSIS — F330 Major depressive disorder, recurrent, mild: Secondary | ICD-10-CM

## 2020-09-26 DIAGNOSIS — F5105 Insomnia due to other mental disorder: Secondary | ICD-10-CM | POA: Diagnosis not present

## 2020-09-26 DIAGNOSIS — R413 Other amnesia: Secondary | ICD-10-CM | POA: Diagnosis not present

## 2020-09-26 DIAGNOSIS — Z8659 Personal history of other mental and behavioral disorders: Secondary | ICD-10-CM

## 2020-09-26 DIAGNOSIS — F411 Generalized anxiety disorder: Secondary | ICD-10-CM | POA: Diagnosis not present

## 2020-09-26 MED ORDER — ESCITALOPRAM OXALATE 20 MG PO TABS: 20.0000 mg | ORAL_TABLET | Freq: Every day | ORAL | 0 refills | Status: DC

## 2020-09-26 NOTE — Progress Notes (Signed)
Virtual Visit via Telephone Note  I connected with Kristen Pennington on 09/26/20 at 11:00 AM EDT by telephone and verified that I am speaking with the correct person using two identifiers.  Location Provider Location : [facility] Patient Location : Home  Participants: Patient , Provider    I discussed the limitations, risks, security and privacy concerns of performing an evaluation and management service by telephone and the availability of in person appointments. I also discussed with the patient that there may be a patient responsible charge related to this service. The patient expressed understanding and agreed to proceed.  I discussed the assessment and treatment plan with the patient. The patient was provided an opportunity to ask questions and all were answered. The patient agreed with the plan and demonstrated an understanding of the instructions.   The patient was advised to call back or seek an in-person evaluation if the symptoms worsen or if the condition fails to improve as anticipated.   [place] MD OP Progress Note  09/26/2020 3:32 PM Kristen Pennington  MRN:  [id number]  Chief Complaint:  Chief Complaint    Follow-up; Anxiety     HPI: Kristen Pennington is a 87 year old Caucasian female, widowed, lives in [place], has a history of MDD, GAD, delusional disorder, insomnia, hearing loss, vision loss, OSA, hypertension was evaluated by telemedicine today.  Patient today reports she continues to have problems at the facility that she lives at.  She reports she is currently going through an inspection of her apartment and has someone coming into look at her apartment this coming Tuesday.  She hence has been busy taking care of that.  Patient continues to report she has trouble buying her groceries as well as getting things done.  She reports she has trouble driving and getting to places.  Her son passed away and her daughter does not live close to her.  This has been affecting her  emotionally.  Patient however has started psychotherapy sessions and has upcoming appointment this afternoon.  She agrees to talk to her therapist.  Patient also is interested in a family meeting if possible.  Patient denies any suicidality, homicidality or perceptual disturbances.  She reports sleep is overall okay.  She is compliant on medications and denies side effects.   Visit Diagnosis:    ICD-10-CM   1. Generalized anxiety disorder  F41.1 escitalopram (LEXAPRO) 20 MG tablet  2. MDD (major depressive disorder), recurrent episode, mild ([place])  F33.0   3. Insomnia due to mental condition  F51.05   4. Memory problem  R41.3     Past Psychiatric History: I have reviewed past psychiatric history from my progress note on 08/11/2018.  Past trials of Klonopin, Abilify, Lexapro, mirtazapine  Past Medical History:  Past Medical History:  Diagnosis Date  . Arthritis   . Atrophic vaginitis 03/06/2013   Last Assessment & Plan:  She will plan to continue estradiol vaginal cream.  Prescription was rewritten stipulating use of the generic product.   . Atypical migraine 03/06/2013   Last Assessment & Plan:  Since she rarely takes sumatriptan, we will discontinue it. Continue sparing use of OTC migraine products.   . Avitaminosis D 03/06/2013   Last Assessment & Plan:  Recheck vitamin D level. Plan to continue vitamin D supplementation.   . Ceratitis 08/01/2013   Last Assessment & Plan:  Because of the expense, she will discontinue Restasis. She will use over-the-counter moisturizing eyedrops and liquid gel.   . Colon polyp   .  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   . 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   . Cystocele, midline 03/06/2013  . Demoralization and apathy 03/06/2013   Last Assessment & Plan:  Since  she has been taking bupropion only once a day, I will write the prescription with the correct instructions and correct quantity. She has done well on this for years and she is encouraged to take it regularly   . Depression   . Environmental allergies   . Epistaxis   . Essential (primary) hypertension 05/17/2013   Last Assessment & Plan:  Her blood pressure is well-controlled. We will plan to continue hydrochlorothiazide and benazepril.  Both are generic and should be affordable on her health plan.   Marland Kitchen GERD (gastroesophageal reflux disease)   . Hemorrhoid   . Inflammation of sacroiliac joint ([place]) 03/06/2013   Last Assessment & Plan:  She is doing well on her present regimen of fentanyl patch supplemented with sparing use of tramadol/acetaminophen. We will continue that regimen.   . Sinusitis   . Sleep apnea    CPAP    Past Surgical History:  Procedure Laterality Date  . COLONOSCOPY  05-03-15  . COLONOSCOPY WITH PROPOFOL N/A 05/03/2015   Procedure: COLONOSCOPY WITH PROPOFOL;  Surgeon: Lucilla Lame, MD;  Location: [facility];  Service: Endoscopy;  Laterality: N/A;  CPAP  . DILATION AND CURETTAGE OF UTERUS  1970  . ENDOMETRIAL BIOPSY    . EYE SURGERY Right 2017  . POLYPECTOMY  05/03/2015   Procedure: POLYPECTOMY;  Surgeon: Lucilla Lame, MD;  Location: [facility];  Service: Endoscopy;;  . ROTATOR CUFF REPAIR  2007  . SPINE SURGERY      Family Psychiatric History: I have reviewed family psychiatric history from my progress note on 08/11/2018  Family History:  Family History  Problem Relation Age of Onset  . Arthritis Mother   . Alzheimer's disease Father   . Heart disease Sister   . Cancer Brother   . Ovarian cancer Neg Hx   . Breast cancer Neg Hx   . Colon cancer Neg Hx   . Diabetes Neg Hx     Social History: Reviewed social history from my progress note on 08/11/2018 Social History   Socioeconomic History  . Marital status: Widowed    Spouse name: Not on file   . Number of children: 2  . Years of education: Not on file  . Highest education level: Some college, no degree  Occupational History  . Not on file  Tobacco Use  . Smoking status: Never Smoker  . Smokeless tobacco: Never Used  Vaping Use  . Vaping Use: Never used  Substance and Sexual Activity  . Alcohol use: No    Alcohol/week: 0.0 standard drinks  . Drug use: No  . Sexual activity: Not Currently  Other Topics Concern  . Not on file  Social History Narrative  . Not on file   Social Determinants of Health   Financial Resource Strain: Not on file  Food Insecurity: Not on file  Transportation Needs: Not on file  Physical Activity: Not on file  Stress: Not on file  Social Connections: Not on file    Allergies: No Known Allergies  Metabolic Disorder Labs: Lab Results  Component Value Date   HGBA1C 5.4 08/01/2018  Lab Results  Component Value Date   PROLACTIN 8.1 08/01/2018   Lab Results  Component Value Date   CHOL 245 (H) 08/01/2018   TRIG 187 (H) 08/01/2018   HDL 49 08/01/2018   LDLCALC 159 (H) 08/01/2018   Lab Results  Component Value Date   TSH 2.830 08/10/2018   TSH 5.550 (H) 08/01/2018    Therapeutic Level Labs: No results found for: LITHIUM No results found for: VALPROATE No components found for:  CBMZ  Current Medications: Current Outpatient Medications  Medication Sig Dispense Refill  . acetaminophen (TYLENOL) 500 MG tablet Take 500 mg by mouth every 6 (six) hours as needed.    . bisoprolol-hydrochlorothiazide (ZIAC) 5-6.25 MG tablet TAKE 1 TABLET BY MOUTH DAILY 30 tablet 3  . Carboxymethylcellulose Sodium (THERATEARS OP) Apply to eye.    . Cholecalciferol (VITAMIN D3) 5000 units TABS Take 5,000 Units by mouth daily.    Marland Kitchen dicyclomine (BENTYL) 10 MG capsule Take 10 mg by mouth 4 (four) times daily -  before meals and at bedtime.    Marland Kitchen doxycycline (VIBRA-TABS) 100 MG tablet Take 1 tablet (100 mg total) by mouth 2 (two) times daily. 20 tablet 0   . escitalopram (LEXAPRO) 20 MG tablet Take 1 tablet (20 mg total) by mouth daily. 90 tablet 0  . estradiol (ESTRACE) 0.1 MG/GM vaginal cream Use once a week as needed 42.5 g 1  . fluticasone (FLONASE) 50 MCG/ACT nasal spray 1 SPRAY IN EACH NOSTRIL ONCE ADAY AS NEEDED 16 g 1  . hydrOXYzine (ATARAX/VISTARIL) 10 MG tablet Take 1 tablet (10 mg total) by mouth 3 (three) times daily as needed for anxiety. For severe anxiety attacks only (Patient taking differently: Take 5 mg by mouth 3 (three) times daily as needed for anxiety. For severe anxiety attacks only) 90 tablet 3  . Liniments (SALONPAS PAIN RELIEF PATCH EX) Apply 1 patch topically 2 (two) times daily.    Marland Kitchen lubiprostone (AMITIZA) 8 MCG capsule Take 1 capsule po bid prn ibs constipation. 60 capsule 3  . meloxicam (MOBIC) 7.5 MG tablet Take 1 tablet (7.5 mg total) by mouth daily. 30 tablet 2  . Multiple Vitamins-Minerals (CENTRUM SILVER 50+WOMEN) TABS Take by mouth.    . oxybutynin (DITROPAN-XL) 10 MG 24 hr tablet Take 1 tablet (10 mg total) by mouth daily. 30 tablet 2  . pantoprazole (PROTONIX) 40 MG tablet Take 1 tablet (40 mg total) by mouth daily. 90 tablet 1  . Polyethyl Glycol-Propyl Glycol (SYSTANE) 0.4-0.3 % SOLN Apply to eye.    . promethazine (PHENERGAN) 25 MG tablet Take 25 mg by mouth as needed.     Marland Kitchen rOPINIRole (REQUIP) 1 MG tablet TAKE 1 TABLET BY MOUTH THREE TIMES DAILYAS NEEDED FOR RESTLESS LEGS 90 tablet 3  . simethicone (MYLICON) 80 MG chewable tablet Chew 80 mg by mouth every 6 (six) hours as needed for flatulence.    . traZODone (DESYREL) 50 MG tablet TAKE 1/2 TO 1 TABLET BY MOUTH AT BEDTIMEAS DIRECTED, STOP TAKING REMERON (MIRTAZAPINE) 30 tablet 2  . XIIDRA 5 % SOLN Apply to eye. Sample given by eye dr     No current facility-administered medications for this visit.     Musculoskeletal: Strength & Muscle Tone: UTA Gait & Station: UTA Patient leans: N/A  Psychiatric Specialty Exam: Review of Systems   Psychiatric/Behavioral: The patient is nervous/anxious.   All other systems reviewed and are negative.   There were no vitals taken for this visit.There is no height or  weight on file to calculate BMI.  General Appearance: UTA  Eye Contact:  UTA  Speech:  Clear and Coherent  Volume:  Normal  Mood:  Anxious  Affect:  UTA  Thought Process:  Goal Directed and Descriptions of Associations: Intact  Orientation:  Full (Time, Place, and Person)  Thought Content: Logical   Suicidal Thoughts:  No  Homicidal Thoughts:  No  Memory:  Immediate;   Fair Recent;   Fair Remote;   Fair does report short term memory loss  Judgement:  Fair  Insight:  Fair  Psychomotor Activity:  UTA  Concentration:  Concentration: Fair and Attention Span: Fair  Recall:  [employer] of Knowledge: Fair  Language: Fair  Akathisia:  No  Handed:  Right  AIMS (if indicated):UTA  Assets:  Communication Skills Desire for Improvement Housing Social Support  ADL's:  Intact  Cognition: WNL  Sleep:  Fair   Screenings: Mini-Mental   Flowsheet Row Clinical Support from 03/07/2020 in [facility], [place] from 03/06/2019 in [facility], [place] from 03/02/2018 in [facility], [facility]  Total Score (max 30 points ) 30 30 27     PHQ2-9   Flowsheet Row Video Visit from 08/29/2020 in [facility] Office Visit from 06/10/2020 in [facility], [facility] Office Visit from 05/06/2020 in [facility] Support from 03/07/2020 in [facility], [facility] Office Visit from 02/14/2020 in [facility]  PHQ-2 Total Score 1 0 0 3 0    Flowsheet Row Video Visit from 08/29/2020 in [facility] No Risk       Assessment and Plan: Kristen Pennington is a 87 year old female who has a history of GAD, MDD,  history of psychosis, essential hypertension, chronic pain, OSA, hearing loss, bereavement was evaluated by telemedicine today.  Patient is currently grieving the loss of her son.  Patient with memory issues, situational stressors will benefit from psychotherapy sessions.  Continue plan as noted below.  Plan GAD-unstable Lexapro 20 mg p.o. daily Continue hydroxyzine 10 mg p.o. daily as needed for anxiety attacks Patient has started CBT and has upcoming appointment this afternoon. I have contacted therapist for possible family meeting with patient as well as daughter.  Therapist to have a discussion with patient this afternoon.  Insomnia-stable Trazodone 25-50 mg p.o. nightly as needed Continue CPAP  Memory problems-patient was referred for neurology evaluation. TSH, vitamin B12 labs were ordered last visit-pending  Follow-up in clinic in person in 2 months from now.  We will consider getting an MMSE done at that visit.   I have spent at least 20 minutes non face to face with patient today which includes the time spent for preparing to see the patient  (e.g., review of test, records), ordering medications and test, psychoeducation and supportive psychotherapy, care coordination as well as documenting clinical information in electronic health record.     This note was generated in part or whole with voice recognition software. Voice recognition is usually quite accurate but there are transcription errors that can and very often do occur. I apologize for any typographical errors that were not detected and corrected.         Ursula Alert, MD 09/27/2020, 8:41 AM

## 2020-09-26 NOTE — Progress Notes (Signed)
Virtual Visit via Telephone Note  I connected with Kristen Pennington on 09/26/20 at  1:00 PM EDT by telephone and verified that I am speaking with the correct person using two identifiers.  Participating Parties Patient Provider  Location: Patient: Home Provider: Home Office   I discussed the limitations, risks, security and privacy concerns of performing an evaluation and management service by telephone and the availability of in person appointments. I also discussed with the patient that there may be a patient responsible charge related to this service. The patient expressed understanding and agreed to proceed.  THERAPY PROGRESS NOTE  Session Time: 12 Minutes  Participation Level: Active  Behavioral Response: AlertDepressed  Type of Therapy: Individual Therapy  Treatment Goals addressed: Coping  Interventions: Solution Focused and Supportive  Summary: Kristen Pennington is a 87 y.o. female who presents with anxiety and depression sxs. Pt reported last time she saw her adult daughter was around the holidays. Pt attributed daughter working full-time and "under control" of her husband as reasons she does not visit with patient more often. Pt reported when she calls daughter to ask for assistance "she tells me she doesn't have the time". Pt described difficulty receiving groceries although daughter had set something up for her on the computer "but it didn't work. I got to figure out how to survive without her". Pt reported she was receiving food stamps each month and got two different letters in the mail about "how much I should get each month" and another informing patient she would have to reapply for services. Pt replied she was able to access her account and has "a person in my life that is connected to politics" who has talked with a senator to look into her situation. Pt continues to voice concerns about manager of her complex, feeling "verbally attacked", and speaking to regional  manager "who is going to make things right". Pt consented to therapist reaching out to patient's daughter in attempt to schedule a family session. Pt reported "it probably won't do any good," and verified correct contact information in patient's chart. Pt reported her daughter usually is working between hours of 12pm-6pm. Pt reported she has tried to explain to daughter about issues with memory and confusion s/ "I can't follow my thoughts and it is hard not having anyone to talk to". Pt referenced past relationship issues with daughter.  Suicidal/Homicidal: No  Therapist Response: Therapist and patient explored concerns about lack of supports and relationship with adult daughter. Therapist discussed with patient psychiatrist's recommendation for adult daughter to be more involved in patient's care and obtained consent to reach out for scheduling a family session. Pt was receptive, but doubtful this would be of benefit.  Plan: Return again in 2 weeks.  Diagnosis: Axis I: Generalized Anxiety Disorder and MDD, Recurrent, Mild, Insomnia, Memory Loss, Hx of Delusions    Axis II: N/A  [name], LCSW, LCAS 09/26/2020

## 2020-09-27 ENCOUNTER — Telehealth: Payer: Self-pay | Admitting: Licensed Clinical Social Worker

## 2020-09-27 NOTE — Telephone Encounter (Signed)
Therapist left voicemail for patient's daughter in efforts to obtain support for patient continuity of care. Therapist left contact number and invitation for daughter to join next phone visit scheduled for 3/31 at 1pm. Verbal consent from patient obtained.

## 2020-09-30 ENCOUNTER — Telehealth: Payer: Self-pay | Admitting: Licensed Clinical Social Worker

## 2020-09-30 ENCOUNTER — Other Ambulatory Visit: Payer: Self-pay | Admitting: Psychiatry

## 2020-09-30 ENCOUNTER — Other Ambulatory Visit: Payer: Self-pay | Admitting: Nurse Practitioner

## 2020-09-30 ENCOUNTER — Other Ambulatory Visit: Payer: Self-pay | Admitting: Internal Medicine

## 2020-09-30 NOTE — Telephone Encounter (Signed)
Therapist called back patient's daughter for collateral information. Daughter reported she lives in [place] and is willing to take a day off if needed to assist patient. Although patient has her own car she does not feel comfortable driving long distances and has been advised by daughter not to drive herself to [place]. Daughter reported no concerns about patient ability to provide care for self at this time and provided examples. Daughter to discuss with patient referral for neurology.

## 2020-10-07 ENCOUNTER — Encounter: Payer: Self-pay | Admitting: Physician Assistant

## 2020-10-07 ENCOUNTER — Other Ambulatory Visit: Payer: Self-pay

## 2020-10-07 ENCOUNTER — Ambulatory Visit (INDEPENDENT_AMBULATORY_CARE_PROVIDER_SITE_OTHER): Payer: Medicare HMO | Admitting: Physician Assistant

## 2020-10-07 VITALS — BP 150/74 | HR 51 | Temp 98.4°F | Ht 65.0 in | Wt 146.4 lb

## 2020-10-07 DIAGNOSIS — I10 Essential (primary) hypertension: Secondary | ICD-10-CM | POA: Diagnosis not present

## 2020-10-07 DIAGNOSIS — F3341 Major depressive disorder, recurrent, in partial remission: Secondary | ICD-10-CM | POA: Diagnosis not present

## 2020-10-07 DIAGNOSIS — E875 Hyperkalemia: Secondary | ICD-10-CM

## 2020-10-07 DIAGNOSIS — K581 Irritable bowel syndrome with constipation: Secondary | ICD-10-CM | POA: Diagnosis not present

## 2020-10-07 DIAGNOSIS — F411 Generalized anxiety disorder: Secondary | ICD-10-CM | POA: Diagnosis not present

## 2020-10-07 MED ORDER — DICYCLOMINE HCL 10 MG PO CAPS: 10.0000 mg | ORAL_CAPSULE | Freq: Three times a day (TID) | ORAL | 1 refills | Status: DC

## 2020-10-07 MED ORDER — LUBIPROSTONE 8 MCG PO CAPS: ORAL_CAPSULE | ORAL | 2 refills | Status: DC

## 2020-10-07 MED ORDER — OXYBUTYNIN CHLORIDE ER 10 MG PO TB24: 10.0000 mg | ORAL_TABLET | Freq: Every day | ORAL | 2 refills | Status: DC

## 2020-10-07 NOTE — Progress Notes (Signed)
[facility] [place], [place] [address]  Internal MEDICINE  Office Visit Note  Patient Name: Kristen Pennington  [id number]  [id number]  Date of Service: 10/09/2020  Chief Complaint  Patient presents with  . Hypertension    4 month fup  . Anxiety    Anxiety medication seems to be bothering her and feels she needs to stop it or change to something else  . Medication Problem    Dicyclomine 10 mg she wants to see if she can get this medication prescribed by dr's here at [place] instead of having to go to other dr's..........Marland Kitchen  Amitiza: she also wants to see if she can get it prescribed by [facility] instead of her other dr's    HPI -Pt is here for f/u.  -Hopes we can take over her IBS medications to avoid an additional trip out since she wont be driving for much longer she thinks. -She has been stressed and is followed by behavioral health and counseling. She doesn't think her meds are where they should be. Does not have a lot of family support. States in laws dont get along well with her. Not a great relationship. She is going to contact her behavioral health provider about possibly adjusting her medications and has a counseling session this week. -BP initially elevated but improved on recheck to 144/70. HR low initially but was normal on exam--Pennington need to monitor especially while on Ziac.  Current Medication: Outpatient Encounter Medications as of 10/07/2020  Medication Sig  . acetaminophen (TYLENOL) 500 MG tablet Take 500 mg by mouth every 6 (six) hours as needed.  . bisoprolol-hydrochlorothiazide (ZIAC) 5-6.25 MG tablet TAKE 1 TABLET BY MOUTH DAILY  . Carboxymethylcellulose Sodium (THERATEARS OP) Apply to eye.  . Cholecalciferol (VITAMIN D3) 5000 units TABS Take 5,000 Units by mouth daily.  Marland Kitchen doxycycline (VIBRA-TABS) 100 MG tablet Take 1 tablet (100 mg total) by mouth 2 (two) times daily.  Marland Kitchen escitalopram (LEXAPRO) 20 MG tablet Take 1 tablet (20 mg total) by mouth  daily.  Marland Kitchen estradiol (ESTRACE) 0.1 MG/GM vaginal cream Use once a week as needed  . fluticasone (FLONASE) 50 MCG/ACT nasal spray 1 SPRAY IN EACH NOSTRIL ONCE ADAY AS NEEDED  . hydrOXYzine (ATARAX/VISTARIL) 10 MG tablet Take 1 tablet (10 mg total) by mouth 3 (three) times daily as needed for anxiety. For severe anxiety attacks only (Patient taking differently: Take 5 mg by mouth 3 (three) times daily as needed for anxiety. For severe anxiety attacks only)  . Liniments (SALONPAS PAIN RELIEF PATCH EX) Apply 1 patch topically 2 (two) times daily.  . meloxicam (MOBIC) 7.5 MG tablet TAKE 1 TABLET BY MOUTH DAILY  . Multiple Vitamins-Minerals (CENTRUM SILVER 50+WOMEN) TABS Take by mouth.  . pantoprazole (PROTONIX) 40 MG tablet Take 1 tablet (40 mg total) by mouth daily.  Vladimir Faster Glycol-Propyl Glycol (SYSTANE) 0.4-0.3 % SOLN Apply to eye.  . promethazine (PHENERGAN) 25 MG tablet Take 25 mg by mouth as needed.   Marland Kitchen rOPINIRole (REQUIP) 1 MG tablet TAKE 1 TABLET BY MOUTH THREE TIMES DAILYAS NEEDED FOR RESTLESS LEGS  . simethicone (MYLICON) 80 MG chewable tablet Chew 80 mg by mouth every 6 (six) hours as needed for flatulence.  . traZODone (DESYREL) 50 MG tablet TAKE 1/2 TO 1 TABLET BY MOUTH AT BEDTIMEAS DIRECTED, STOP TAKING REMERON (MIRTAZAPINE)  . XIIDRA 5 % SOLN Apply to eye. Sample given by eye dr  . [DISCONTINUED] dicyclomine (BENTYL) 10 MG capsule Take 10 mg  by mouth 4 (four) times daily -  before meals and at bedtime.  . [DISCONTINUED] lubiprostone (AMITIZA) 8 MCG capsule Take 1 capsule po bid prn ibs constipation.  . [DISCONTINUED] oxybutynin (DITROPAN-XL) 10 MG 24 hr tablet Take 1 tablet (10 mg total) by mouth daily.  Marland Kitchen dicyclomine (BENTYL) 10 MG capsule Take 1 capsule (10 mg total) by mouth 4 (four) times daily -  before meals and at bedtime.  Marland Kitchen lubiprostone (AMITIZA) 8 MCG capsule Take 1 capsule po bid prn ibs constipation.   No facility-administered encounter medications on file as of  10/07/2020.    Surgical History: Past Surgical History:  Procedure Laterality Date  . COLONOSCOPY  05-03-15  . COLONOSCOPY WITH PROPOFOL N/A 05/03/2015   Procedure: COLONOSCOPY WITH PROPOFOL;  Surgeon: Lucilla Lame, MD;  Location: [facility];  Service: Endoscopy;  Laterality: N/A;  CPAP  . DILATION AND CURETTAGE OF UTERUS  1970  . ENDOMETRIAL BIOPSY    . EYE SURGERY Right 2017  . POLYPECTOMY  05/03/2015   Procedure: POLYPECTOMY;  Surgeon: Lucilla Lame, MD;  Location: [facility];  Service: Endoscopy;;  . ROTATOR CUFF REPAIR  2007  . SPINE SURGERY      Medical History: Past Medical History:  Diagnosis Date  . Arthritis   . Atrophic vaginitis 03/06/2013   Last Assessment & Plan:  She Pennington plan to continue estradiol vaginal cream.  Prescription was rewritten stipulating use of the generic product.   . Atypical migraine 03/06/2013   Last Assessment & Plan:  Since she rarely takes sumatriptan, we Pennington discontinue it. Continue sparing use of OTC migraine products.   . Avitaminosis D 03/06/2013   Last Assessment & Plan:  Recheck vitamin D level. Plan to continue vitamin D supplementation.   . Ceratitis 08/01/2013   Last Assessment & Plan:  Because of the expense, she Pennington discontinue Restasis. She Pennington use over-the-counter moisturizing eyedrops and liquid gel.   . Colon polyp   . 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   . 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   . Cystocele, midline 03/06/2013  . Demoralization and apathy 03/06/2013   Last Assessment & Plan:  Since she has been taking bupropion only once a day, I Pennington write the prescription with the correct instructions and correct quantity. She has done well on this for years and she is encouraged to take it regularly   . Depression   .  Environmental allergies   . Epistaxis   . Essential (primary) hypertension 05/17/2013   Last Assessment & Plan:  Her blood pressure is well-controlled. We Pennington plan to continue hydrochlorothiazide and benazepril.  Both are generic and should be affordable on her health plan.   Marland Kitchen GERD (gastroesophageal reflux disease)   . Hemorrhoid   . Inflammation of sacroiliac joint ([place]) 03/06/2013   Last Assessment & Plan:  She is doing well on her present regimen of fentanyl patch supplemented with sparing use of tramadol/acetaminophen. We Pennington continue that regimen.   . Sinusitis   . Sleep apnea    CPAP    Family History: Family History  Problem Relation Age of Onset  . Arthritis Mother   . Alzheimer's disease Father   . Heart disease Sister   . Cancer Brother   . Ovarian cancer Neg Hx   .  Breast cancer Neg Hx   . Colon cancer Neg Hx   . Diabetes Neg Hx     Social History   Socioeconomic History  . Marital status: Widowed    Spouse name: Not on file  . Number of children: 2  . Years of education: Not on file  . Highest education level: Some college, no degree  Occupational History  . Not on file  Tobacco Use  . Smoking status: Never Smoker  . Smokeless tobacco: Never Used  Vaping Use  . Vaping Use: Never used  Substance and Sexual Activity  . Alcohol use: No    Alcohol/week: 0.0 standard drinks  . Drug use: No  . Sexual activity: Not Currently  Other Topics Concern  . Not on file  Social History Narrative  . Not on file   Social Determinants of Health   Financial Resource Strain: Not on file  Food Insecurity: Not on file  Transportation Needs: Not on file  Physical Activity: Not on file  Stress: Not on file  Social Connections: Not on file  Intimate Partner Violence: Not on file      Review of Systems  Constitutional: Positive for fatigue. Negative for chills and unexpected weight change.  HENT: Negative for congestion, postnasal drip, rhinorrhea, sneezing and  sore throat.   Eyes: Negative for redness.  Respiratory: Negative for cough, chest tightness and shortness of breath.   Cardiovascular: Negative for chest pain and palpitations.  Gastrointestinal: Positive for constipation. Negative for abdominal pain, diarrhea, nausea and vomiting.  Genitourinary: Negative for dysuria and frequency.  Musculoskeletal: Positive for arthralgias. Negative for back pain, joint swelling and neck pain.  Skin: Negative for rash.  Neurological: Negative.  Negative for tremors and numbness.  Hematological: Negative for adenopathy. Does not bruise/bleed easily.  Psychiatric/Behavioral: Positive for behavioral problems (Depression) and dysphoric mood. Negative for sleep disturbance and suicidal ideas. The patient is nervous/anxious.     Vital Signs: BP (!) 150/74   Pulse (!) 51   Temp 98.4 F (36.9 C)   Ht 5\' 5"  (1.651 m)   Wt 146 lb 6.4 oz (66.4 kg)   SpO2 99%   BMI 24.36 kg/m    Physical Exam Vitals and nursing note reviewed.  Constitutional:      General: She is not in acute distress.    Appearance: She is well-developed and normal weight. She is not diaphoretic.  HENT:     Head: Normocephalic and atraumatic.     Mouth/Throat:     Pharynx: No oropharyngeal exudate.  Eyes:     Pupils: Pupils are equal, round, and reactive to light.  Neck:     Thyroid: No thyromegaly.     Vascular: No JVD.     Trachea: No tracheal deviation.  Cardiovascular:     Rate and Rhythm: Normal rate and regular rhythm.     Heart sounds: Normal heart sounds. No murmur heard. No friction rub. No gallop.   Pulmonary:     Effort: Pulmonary effort is normal. No respiratory distress.     Breath sounds: No wheezing or rales.  Chest:     Chest wall: No tenderness.  Abdominal:     General: Bowel sounds are normal.     Palpations: Abdomen is soft.  Musculoskeletal:        General: Normal range of motion.     Cervical back: Normal range of motion and neck supple.   Lymphadenopathy:     Cervical: No cervical adenopathy.  Skin:  General: Skin is warm and dry.  Neurological:     Mental Status: She is alert and oriented to person, place, and time.     Cranial Nerves: No cranial nerve deficit.  Psychiatric:        Behavior: Behavior normal.        Thought Content: Thought content normal.        Judgment: Judgment normal.     Comments: Pt is anxious        Assessment/Plan: 1. Essential hypertension Initially elevated, but improved on recheck. Pennington continue Ziac, though need to monitor HR since initially low before normalizing.    2. GAD (generalized anxiety disorder) Followed by behavioral health and counselor  3. MDD (major depressive disorder), recurrent, in partial remission ([place]) Followed by behavioral health and counselor  4. Irritable bowel syndrome with constipation May take amitiza and bentyl as needed for IBS. Pt does not use these regularly, but needs them on occasion when her IBS flares up worse than usual. - lubiprostone (AMITIZA) 8 MCG capsule; Take 1 capsule po bid prn ibs constipation.  Dispense: 60 capsule; Refill: 2  5. Hyperkalemia Pt did not have labs done after last visit, explained that she needs to have these done for [place] to monitor and adjust treatment accordingly  6. Hypercalcemia Pt did not have labs done after last visit, explained that she needs to have these done for [place] to monitor and adjust treatment accordingly   General Counseling: annamae shivley understanding of the findings of todays visit and agrees with plan of treatment. I have discussed any further diagnostic evaluation that may be needed or ordered today. We also reviewed her medications today. she has been encouraged to call the office with any questions or concerns that should arise related to todays visit.  Hypertension Counseling:   The following hypertensive lifestyle modification were recommended and discussed:  1. Limiting alcohol intake to  less than 1 oz/day of ethanol:(24 oz of beer or 8 oz of wine or 2 oz of 100-proof whiskey). 2. Take baby ASA 81 mg daily. 3. Importance of regular aerobic exercise and losing weight. 4. Reduce dietary saturated fat and cholesterol intake for overall cardiovascular health. 5. Maintaining adequate dietary potassium, calcium, and magnesium intake. 6. Regular monitoring of the blood pressure. 7. Reduce sodium intake to less than 100 mmol/day (less than 2.3 gm of sodium or less than 6 gm of sodium choride)   No orders of the defined types were placed in this encounter.   Meds ordered this encounter  Medications  . lubiprostone (AMITIZA) 8 MCG capsule    Sig: Take 1 capsule po bid prn ibs constipation.    Dispense:  60 capsule    Refill:  2  . dicyclomine (BENTYL) 10 MG capsule    Sig: Take 1 capsule (10 mg total) by mouth 4 (four) times daily -  before meals and at bedtime.    Dispense:  120 capsule    Refill:  1    This patient was seen by Drema Dallas, PA-C in collaboration with Dr. Clayborn Bigness as a part of collaborative care agreement.   Total time spent:30 Minutes Time spent includes review of chart, medications, test results, and follow up plan with the patient.      Dr Lavera Guise Internal medicine

## 2020-10-10 ENCOUNTER — Encounter: Payer: Self-pay | Admitting: Licensed Clinical Social Worker

## 2020-10-10 ENCOUNTER — Ambulatory Visit (INDEPENDENT_AMBULATORY_CARE_PROVIDER_SITE_OTHER): Payer: Medicare HMO | Admitting: Licensed Clinical Social Worker

## 2020-10-10 ENCOUNTER — Other Ambulatory Visit: Payer: Self-pay

## 2020-10-10 DIAGNOSIS — F5105 Insomnia due to other mental disorder: Secondary | ICD-10-CM

## 2020-10-10 DIAGNOSIS — Z8659 Personal history of other mental and behavioral disorders: Secondary | ICD-10-CM

## 2020-10-10 DIAGNOSIS — R413 Other amnesia: Secondary | ICD-10-CM

## 2020-10-10 DIAGNOSIS — F330 Major depressive disorder, recurrent, mild: Secondary | ICD-10-CM | POA: Diagnosis not present

## 2020-10-10 DIAGNOSIS — F411 Generalized anxiety disorder: Secondary | ICD-10-CM | POA: Diagnosis not present

## 2020-10-10 NOTE — Progress Notes (Signed)
Virtual Visit via Telephone Note  I connected with Kristen Pennington on 10/10/20 at  1:00 PM EDT by telephone and verified that I am speaking with the correct person using two identifiers.  Participating Parties Patient Provider  Location: Patient: Home Provider: Home Office   I discussed the limitations, risks, security and privacy concerns of performing an evaluation and management service by telephone and the availability of in person appointments. I also discussed with the patient that there may be a patient responsible charge related to this service. The patient expressed understanding and agreed to proceed.  THERAPY PROGRESS NOTE  Session Time: 38 Minutes  Participation Level: Active  Behavioral Response: AlertAnxious and Depressed  Type of Therapy: Individual Therapy  Treatment Goals addressed: Coping  Interventions: Supportive  Summary: Kristen Pennington is a 87 y.o. female who presents with anxiety and depression sxs. Pt reported feeling "not terribly bad, not terribly good though either. Just hanging in there". Pt reported last time she spoke with daughter she was informed daughter was "not interested in being on today's call". Pt has yet to follow up with neurology referral and unsure of how to get in contact. Pt reported continued issues with memory and believes it is connected to taking her current medications. Pt reported "I used to not have all this until I started medications". Pt reported she did her own research and concluded that a lot of her medications "cause inflammation" and has been reading about Lexapro from "a leaflet I got at the drug store". Pt reported "I need more attention to my medicines" and is wanting to discuss coming off of them. Pt reported other issues include vision problems requiring "someone to help me read" and is fearful of driving. Pt reported "I haven't driven in 2-3 months" and gets help from a neighbor in her complex when having to leave her  home. Pt reported there is transportation available through her complex and can have groceries delivered, "but my daughter needs to check it. She doesn't realize I am getting older and having these problems". However, pt reported "I can still do things on my own". Pt reported she wished she could have daughter come visit at least "once per quarter, but I can't get her to do even that". Pt reported that her daughter is her POA with next in line being daughter's husband and then her grandson. Pt reported she wants to change this because she believes daughter's husband is "controlling" and often blames him as the reason her daughter is not more involved. Pt reported "I am going to sleep okay" and denied any other concerns at this time.  Suicidal/Homicidal: No  Therapist Response: Therapist met with patient for follow up. Therapist and patient discussed therapist phone conversation with her daughter about willingness to take day off of work if needed to help her. Pt reported she doesn't believe this. Therapist and patient explored patient concerns regarding sxs. Therapist encouraged patient to discuss medication concerns with her prescribers and to follow up with psychiatrist prior to next appointment scheduled in May.  Plan: Return again in 2 weeks.  Diagnosis: Axis I: Generalized Anxiety Disorder and MDD, Recurrent, Mild, Insomnia, Memory Loss, Hx of Delusions    Axis II: N/A  [name], LCSW, LCAS 10/10/2020

## 2020-10-11 ENCOUNTER — Telehealth: Payer: Self-pay

## 2020-10-11 NOTE — Telephone Encounter (Signed)
left message to check on the status of the labwork that was ordered back in feb. a message was left that pt needed to call [phone] to set up appt to have labwork done. Advised that labwork needed to be done as soon as possible. Labs were order back in feb.

## 2020-10-21 ENCOUNTER — Ambulatory Visit (INDEPENDENT_AMBULATORY_CARE_PROVIDER_SITE_OTHER): Payer: Medicare HMO | Admitting: Licensed Clinical Social Worker

## 2020-10-21 ENCOUNTER — Other Ambulatory Visit: Payer: Self-pay

## 2020-10-21 ENCOUNTER — Encounter: Payer: Self-pay | Admitting: Licensed Clinical Social Worker

## 2020-10-21 DIAGNOSIS — Z8659 Personal history of other mental and behavioral disorders: Secondary | ICD-10-CM

## 2020-10-21 DIAGNOSIS — F330 Major depressive disorder, recurrent, mild: Secondary | ICD-10-CM

## 2020-10-21 DIAGNOSIS — F411 Generalized anxiety disorder: Secondary | ICD-10-CM | POA: Diagnosis not present

## 2020-10-21 DIAGNOSIS — R413 Other amnesia: Secondary | ICD-10-CM | POA: Diagnosis not present

## 2020-10-21 DIAGNOSIS — F5105 Insomnia due to other mental disorder: Secondary | ICD-10-CM | POA: Diagnosis not present

## 2020-10-21 NOTE — Progress Notes (Signed)
Virtual Visit via Telephone Note  I connected with [name] on 10/21/20 at  1:00 PM EDT by telephone and verified that I am speaking with the correct person using two identifiers.  Participating Parties Patient Provider  Location: Patient: Home Provider: Home Office   I discussed the limitations, risks, security and privacy concerns of performing an evaluation and management service by telephone and the availability of in person appointments. I also discussed with the patient that there may be a patient responsible charge related to this service. The patient expressed understanding and agreed to proceed.  THERAPY PROGRESS NOTE  Session Time: 47 Minutes  Participation Level: Active  Behavioral Response: AlertAnxious and Depressed  Type of Therapy: Individual Therapy  Treatment Goals addressed: Coping  Interventions: Supportive  Summary: VALENCIA KASSA is a 87 y.o. female who presents with anxiety and depression sxs. Pt vacillated around severity of sxs. Pt initially reported "everything is terrible and crashing down around me. I have come to grips that no one is there for me". However, also s/ "I am really doing alright. I am not going to commit suicide or live on the streets. You don't have to worry about me there". Pt reported it has been beneficial having someone to talk to, but doesn't see need for continued therapy or other forms of support. Pt reported she saw notice on her door about a big Easter dinner in her complex, but not interest in attending due to "not liking crowds". Pt reported she prefers to have time alone and denied withdrawing from supports. Pt reported that she called her daughter about 3 days ago and was again told "she is too busy" to help patient with having groceries delivered. Pt reported she has canned food in her home and takes trips to doctor and grocery store with neighbor. Pt reported no safety concerns, no falls, and able to engage in ADLs  independently. Pt reported "I am sleeping okay with the medicine I take". Pt reported she will sometimes take her medication  dosage and would like to get off some of her medication, however patient s/ "I haven't called Dr. Shea Evans because I don't feel it is needed". Pt reported she lost paperwork from Dr. Laurelyn Sickle office related to her follow up for labwork ordered back in February. Pt reported no other concerns at this time.   Suicidal/Homicidal: No  Therapist Response: Therapist met with patient for follow up. Therapist and patient reviewed patient needs and concerns. Therapist encouraged patient to follow up in getting lab work done and provided phone number that was left for her by CMA one week ago. Therapist informed patient regarding terminating services with this therapist due to leaving the practice in the next 2 weeks. Pt denied any concerns around this and had no questions at this time. Therapist encouraged patient to continue communicating with psychiatrist, especially around making changes to her medication and to continue receiving support through therapy with new therapist. Pt was somewhat receptive. Therapist to f/u with patient's daughter regarding transition of care and follow up needs.  Plan: Follow up with psychiatrist.  Diagnosis: Axis I: Generalized Anxiety Disorder and MDD, Recurrent, Mild, Insomnia, Memory Loss, Hx of Delusions    Axis II: N/A  [name], LCSW, LCAS 10/21/2020

## 2020-11-01 ENCOUNTER — Other Ambulatory Visit: Payer: Self-pay | Admitting: Internal Medicine

## 2020-11-04 ENCOUNTER — Emergency Department: Payer: Medicare HMO

## 2020-11-04 ENCOUNTER — Other Ambulatory Visit: Payer: Self-pay

## 2020-11-04 ENCOUNTER — Emergency Department
Admission: EM | Admit: 2020-11-04 | Discharge: 2020-11-04 | Disposition: A | Discharge status: Home / Self Care | Payer: Medicare HMO | Attending: Emergency Medicine | Admitting: Emergency Medicine

## 2020-11-04 ENCOUNTER — Encounter: Payer: Self-pay | Admitting: Emergency Medicine

## 2020-11-04 DIAGNOSIS — S0003XA Contusion of scalp, initial encounter: Secondary | ICD-10-CM | POA: Diagnosis not present

## 2020-11-04 DIAGNOSIS — I10 Essential (primary) hypertension: Secondary | ICD-10-CM | POA: Diagnosis not present

## 2020-11-04 DIAGNOSIS — S0990XA Unspecified injury of head, initial encounter: Secondary | ICD-10-CM | POA: Diagnosis present

## 2020-11-04 DIAGNOSIS — W01198A Fall on same level from slipping, tripping and stumbling with subsequent striking against other object, initial encounter: Secondary | ICD-10-CM | POA: Diagnosis not present

## 2020-11-04 DIAGNOSIS — Y92019 Unspecified place in single-family (private) house as the place of occurrence of the external cause: Secondary | ICD-10-CM | POA: Insufficient documentation

## 2020-11-04 DIAGNOSIS — Z79899 Other long term (current) drug therapy: Secondary | ICD-10-CM | POA: Insufficient documentation

## 2020-11-04 DIAGNOSIS — W19XXXA Unspecified fall, initial encounter: Secondary | ICD-10-CM

## 2020-11-04 NOTE — ED Notes (Signed)
Pt provided ice pack as requested

## 2020-11-04 NOTE — ED Triage Notes (Signed)
Pt comes into the ED via POV c/o fall about 30-40 minutes ago.  Pt thought she was okay so she came to the hospital to have outpatient labs drawn, that's when she started having more swelling, pain, and worry.  Pt brought over to the ED.  Pt denies any LOC or blood thinner use.  Pt neurologically intact at this time.

## 2020-11-04 NOTE — ED Notes (Signed)
Pt to CT

## 2020-11-04 NOTE — ED Notes (Signed)
Pt back from CT, c/o pain to left side of head, hematoma noted. Denies loc or blood thinner use. Alert and oriented. Clear speech

## 2020-11-04 NOTE — ED Provider Notes (Signed)
[facility] Emergency Department Provider Note   ____________________________________________    I have reviewed the triage vital signs and the nursing notes.   HISTORY  Chief Complaint Fall     HPI Kristen Pennington is a 87 y.o. female with history as noted below, who is not on blood thinners who presents after mechanical fall.  Patient reports she tripped over something in her house fell backwards and hit her head on the floor.  She denies LOC.  She denies neck pain.  No back pain.  No extremity injuries.  She thought she was okay so she came to the hospital for routine blood work that she had scheduled but decided that she had increased swelling/hematoma so decided to come to the emergency department.  No neurodeficits reported.  Past Medical History:  Diagnosis Date  . Arthritis   . Atrophic vaginitis 03/06/2013   Last Assessment & Plan:  She will plan to continue estradiol vaginal cream.  Prescription was rewritten stipulating use of the generic product.   . Atypical migraine 03/06/2013   Last Assessment & Plan:  Since she rarely takes sumatriptan, we will discontinue it. Continue sparing use of OTC migraine products.   . Avitaminosis D 03/06/2013   Last Assessment & Plan:  Recheck vitamin D level. Plan to continue vitamin D supplementation.   . Ceratitis 08/01/2013   Last Assessment & Plan:  Because of the expense, she will discontinue Restasis. She will use over-the-counter moisturizing eyedrops and liquid gel.   . Colon polyp   . 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   . 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   . Cystocele, midline 03/06/2013  . Demoralization and apathy 03/06/2013   Last Assessment & Plan:  Since she has been taking bupropion only once a day, I  will write the prescription with the correct instructions and correct quantity. She has done well on this for years and she is encouraged to take it regularly   . Depression   . Environmental allergies   . Epistaxis   . Essential (primary) hypertension 05/17/2013   Last Assessment & Plan:  Her blood pressure is well-controlled. We will plan to continue hydrochlorothiazide and benazepril.  Both are generic and should be affordable on her health plan.   Marland Kitchen GERD (gastroesophageal reflux disease)   . Hemorrhoid   . Inflammation of sacroiliac joint ([place]) 03/06/2013   Last Assessment & Plan:  She is doing well on her present regimen of fentanyl patch supplemented with sparing use of tramadol/acetaminophen. We will continue that regimen.   . Sinusitis   . Sleep apnea    CPAP    Patient Active Problem List   Diagnosis Date Noted  . Memory problem 08/29/2020  . Chronic shoulder pain (Left) 06/03/2020  . Arthralgia of shoulder region (Left) 06/03/2020  . Osteoarthritis of shoulder (Left) 06/03/2020  . 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 Acute shoulder pain (Left) 05/30/2020  . Chronic low back pain (Midline) w/o sciatica 05/15/2020  . Spinal enthesopathy of lumbar region ([place]) 05/15/2020  . 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 Long term prescription opiate use 04/29/2020  . Encounter for general adult medical examination with abnormal findings 03/20/2020  . Hyperkalemia 03/20/2020  . Hypercalcemia 03/20/2020  . Abnormal kidney function 03/20/2020  . Abnormal MRI,  lumbar spine (01/08/2020) 02/14/2020  . Lumbar facet hypertrophy 02/14/2020  . MDD (major depressive disorder), recurrent, in full remission ([place]) 12/18/2019  . Osteoarthritis of hips (Bilateral) 11/20/2019  . Lumbar facet arthropathy 11/20/2019  . 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 Primary osteoarthritis involving multiple joints 11/20/2019  . OSA on CPAP 10/26/2019  . MDD (major depressive disorder),  recurrent, in partial remission ([place]) 09/19/2019  . Bereavement 09/19/2019  . Chronic pain syndrome 08/21/2019  . Pharmacologic therapy 08/21/2019  . Disorder of skeletal system 08/21/2019  . Problems influencing health status 08/21/2019  . History of lumbar spinal fusion 08/21/2019  . Chronic hip pain (2ry area of Pain) (Bilateral) (R>L) 08/21/2019  . Lumbar facet syndrome (Bilateral) 08/21/2019  . Dysuria 07/02/2019  . Delusional disorder ([place]) 03/08/2019  . MDD (major depressive disorder), recurrent episode, mild ([place]) 01/18/2019  . 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 History of delusional disorder 01/18/2019  . Gastroesophageal reflux disease without esophagitis 11/27/2018  . Irritable bowel syndrome with constipation 11/27/2018  . Chronic low back pain (1ry area of Pain) (Bilateral) (R>L) w/o sciatica 08/17/2018  . Vitamin B12 deficiency 08/17/2018  . Elevated TSH 08/17/2018  . Urinary tract infection without hematuria 08/16/2017  . Generalized anxiety disorder 08/16/2017  . Primary generalized (osteo)arthritis 08/16/2017  . Melena 12/05/2015  . Blood in stool   . Benign neoplasm of transverse colon   . Benign neoplasm of cecum   . Ceratitis 08/01/2013  . Keratitis sicca ([place]) 08/01/2013  . Essential (primary) hypertension 05/17/2013  . Cystocele, midline 03/06/2013  . Demoralization and apathy 03/06/2013  . Atypical migraine 03/06/2013  . Hyperlipidemia 03/06/2013  . Jittery 03/06/2013  . 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 Atrophic vaginitis 03/06/2013  . Inflammation of sacroiliac joint ([place]) 03/06/2013  . Avitaminosis D 03/06/2013  . Allergic rhinitis due to pollen 03/06/2013  . Body mass index between 19-24, adult 03/06/2013  . Breast screening 03/06/2013  . 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 Screening for depression 03/06/2013    Past Surgical History:  Procedure Laterality Date  .  COLONOSCOPY  05-03-15  . COLONOSCOPY WITH PROPOFOL N/A 05/03/2015   Procedure: COLONOSCOPY WITH PROPOFOL;  Surgeon: Lucilla Lame, MD;  Location: [place];  Service: Endoscopy;  Laterality: N/A;  CPAP  . DILATION AND CURETTAGE OF UTERUS  1970  . ENDOMETRIAL BIOPSY    . EYE SURGERY Right 2017  . POLYPECTOMY  05/03/2015   Procedure: POLYPECTOMY;  Surgeon: Lucilla Lame, MD;  Location: [place];  Service: Endoscopy;;  . ROTATOR CUFF REPAIR  2007  . SPINE SURGERY      Prior to Admission medications   Medication Sig Start Date End Date Taking? Authorizing Provider  acetaminophen (TYLENOL) 500 MG tablet Take 500 mg by mouth every 6 (six) hours as needed.    [provider]  bisoprolol-hydrochlorothiazide [facility]) 5-6.25 MG tablet TAKE 1 TABLET BY MOUTH DAILY 08/01/20   Lavera Guise, MD  Carboxymethylcellulose Sodium (THERATEARS OP) Apply to eye.    [provider]  Cholecalciferol (VITAMIN D3) 5000 units TABS Take 5,000 Units by mouth daily.    [provider]  dicyclomine (BENTYL) 10 MG capsule Take 1 capsule (10 mg total) by mouth 4 (four) times daily -  before meals and at bedtime. 10/07/20 12/06/20  McDonough, Si Gaul, PA-C  doxycycline (VIBRA-TABS) 100 MG tablet Take 1 tablet (100 mg total) by mouth 2 (two) times daily. 08/12/20  Luiz Ochoa, NP  escitalopram (LEXAPRO) 20 MG tablet Take 1 tablet (20 mg total) by mouth daily. 09/26/20   Ursula Alert, MD  estradiol (ESTRACE) 0.1 MG/GM vaginal cream Use once a week as needed 06/28/20   Lavera Guise, MD  fluticasone [facility]) 50 MCG/ACT nasal spray 1 SPRAY IN EACH NOSTRIL ONCE DAILY AS NEEDED 11/04/20   Lavera Guise, MD  hydrOXYzine (ATARAX/VISTARIL) 10 MG tablet Take 1 tablet (10 mg total) by mouth 3 (three) times daily as needed for anxiety. For severe anxiety attacks only Patient taking differently: Take 5 mg by mouth 3 (three) times daily as needed for anxiety. For severe anxiety attacks only  05/21/20   Ursula Alert, MD  Liniments (SALONPAS PAIN RELIEF PATCH EX) Apply 1 patch topically 2 (two) times daily.    [provider]  lubiprostone (AMITIZA) 8 MCG capsule Take 1 capsule po bid prn ibs constipation. 10/07/20   McDonough, Si Gaul, PA-C  meloxicam (MOBIC) 7.5 MG tablet TAKE 1 TABLET BY MOUTH DAILY 09/30/20   Lavera Guise, MD  Multiple Vitamins-Minerals (CENTRUM SILVER 50+WOMEN) TABS Take by mouth.    [provider]  oxybutynin (DITROPAN-XL) 10 MG 24 hr tablet Take 1 tablet (10 mg total) by mouth daily. 10/07/20   McDonough, Si Gaul, PA-C  pantoprazole (PROTONIX) 40 MG tablet Take 1 tablet (40 mg total) by mouth daily. 06/28/20   Lavera Guise, MD  Polyethyl Glycol-Propyl Glycol (SYSTANE) 0.4-0.3 % SOLN Apply to eye.    [provider]  promethazine (PHENERGAN) 25 MG tablet Take 25 mg by mouth as needed.  07/26/13   [provider]  rOPINIRole (REQUIP) 1 MG tablet TAKE 1 TABLET BY MOUTH THREE TIMES DAILYAS NEEDED FOR RESTLESS LEGS 08/01/20   Lavera Guise, MD  simethicone (MYLICON) 80 MG chewable tablet Chew 80 mg by mouth every 6 (six) hours as needed for flatulence.    [provider]  traZODone (DESYREL) 50 MG tablet TAKE 1/2 TO 1 TABLET BY MOUTH AT BEDTIMEAS DIRECTED, STOP TAKING REMERON (MIRTAZAPINE) 10/01/20   Ursula Alert, MD  XIIDRA 5 % SOLN Apply to eye. Sample given by eye dr    [provider]     Allergies Patient has no known allergies.  Family History  Problem Relation Age of Onset  . Arthritis Mother   . Alzheimer's disease Father   . Heart disease Sister   . Cancer Brother   . Ovarian cancer Neg Hx   . Breast cancer Neg Hx   . Colon cancer Neg Hx   . Diabetes Neg Hx     Social History Social History   Tobacco Use  . Smoking status: Never Smoker  . Smokeless tobacco: Never Used  Vaping Use  . Vaping Use: Never used  Substance Use Topics  . Alcohol use: No    Alcohol/week: 0.0 standard drinks   . Drug use: No    Review of Systems  Constitutional: No dizziness Eyes: No visual changes.  ENT: No facial injury Cardiovascular: Denies chest pain. Respiratory: Denies shortness of breath. Gastrointestinal: No abdominal pain.  No nausea, no vomiting.   Genitourinary: Negative for dysuria. Musculoskeletal: As above Skin: Negative for rash. Neurological: Negative for weakness   ____________________________________________   PHYSICAL EXAM:  VITAL SIGNS: ED Triage Vitals [11/04/20 1329]  Enc Vitals Group     BP (!) 163/82     Pulse Rate 61     Resp 18     Temp 98.4  F (36.9 C)     Temp Source Oral     SpO2 96 %     Weight 63 kg (139 lb)     Height 1.676 m (5\' 6" )     Head Circumference      Peak Flow      Pain Score 8     Pain Loc      Pain Edu?      Excl. in [place]?     Constitutional: Alert and oriented. No acute distress. Pleasant and interactive Eyes: Conjunctivae are normal.  Head: Hematoma posterior left scalp, nonbleeding Nose: No congestion/rhinnorhea. Mouth/Throat: Mucous membranes are moist.   Neck:  Painless ROM, no pain with axial load, no vertebral tenderness palpation Cardiovascular: Normal rate, regular rhythm. Grossly normal heart sounds.  Good peripheral circulation.  No chest wall tenderness palpation respiratory: Normal respiratory effort.  No retractions. Lungs CTAB. Gastrointestinal: Soft and nontender. No distention.    Musculoskeletal: Normal range of motion of all extremities, no vertebral tenderness palpation, no pain with axial load on both hips Neurologic:  Normal speech and language. No gross focal neurologic deficits are appreciated.  Skin:  Skin is warm, dry and intact. No rash noted. Psychiatric: Mood and affect are normal. Speech and behavior are normal.  ____________________________________________   LABS (all labs ordered are listed, but only abnormal results are displayed)  Labs Reviewed - No data to  display ____________________________________________  EKG  None ____________________________________________  RADIOLOGY  CT head reviewed by me ____________________________________________   PROCEDURES  Procedure(s) performed: No  Procedures   Critical Care performed: No ____________________________________________   INITIAL IMPRESSION / ASSESSMENT AND PLAN / ED COURSE  Pertinent labs & imaging results that were available during my care of the patient were reviewed by me and considered in my medical decision making (see chart for details).  Patient presents after mechanical fall as described above.  Only injury is to the head, hematoma noted, nonbleeding.  Neuro exam normal.  Patient overall with reassuring exam, pending CT head  CT head without acute abnormality, patient remains well-appearing, essentially asymptomatic, appropriate for discharge at this time    ____________________________________________   FINAL CLINICAL IMPRESSION(S) / ED DIAGNOSES  Final diagnoses:  Fall, initial encounter  Contusion of scalp, initial encounter  Injury of head, initial encounter        Note:  This document was prepared using Dragon voice recognition software and may include unintentional dictation errors.   Lavonia Drafts, MD 11/04/20 [id number]

## 2020-11-07 ENCOUNTER — Other Ambulatory Visit
Admission: RE | Admit: 2020-11-07 | Discharge: 2020-11-07 | Disposition: A | Discharge status: Home / Self Care | Payer: Medicare HMO | Attending: Hospice and Palliative Medicine | Admitting: Hospice and Palliative Medicine

## 2020-11-07 ENCOUNTER — Other Ambulatory Visit: Payer: Self-pay

## 2020-11-07 ENCOUNTER — Encounter: Payer: Self-pay | Admitting: *Deleted

## 2020-11-07 ENCOUNTER — Other Ambulatory Visit: Payer: Self-pay | Admitting: *Deleted

## 2020-11-07 DIAGNOSIS — R5383 Other fatigue: Secondary | ICD-10-CM | POA: Diagnosis not present

## 2020-11-07 DIAGNOSIS — E875 Hyperkalemia: Secondary | ICD-10-CM | POA: Insufficient documentation

## 2020-11-07 DIAGNOSIS — I10 Essential (primary) hypertension: Secondary | ICD-10-CM | POA: Insufficient documentation

## 2020-11-07 DIAGNOSIS — F411 Generalized anxiety disorder: Secondary | ICD-10-CM | POA: Insufficient documentation

## 2020-11-07 LAB — CBC WITH DIFFERENTIAL/PLATELET
Abs Immature Granulocytes: 0.02 10*3/uL (ref 0.00–0.07)
Basophils Absolute: 0.1 10*3/uL (ref 0.0–0.1)
Basophils Relative: 1 %
Eosinophils Absolute: 0.1 10*3/uL (ref 0.0–0.5)
Eosinophils Relative: 1 %
HCT: 41.3 % (ref 36.0–46.0)
Hemoglobin: 13.7 g/dL (ref 12.0–15.0)
Immature Granulocytes: 0 %
Lymphocytes Relative: 22 %
Lymphs Abs: 1.5 10*3/uL (ref 0.7–4.0)
MCH: 31.6 pg (ref 26.0–34.0)
MCHC: 33.2 g/dL (ref 30.0–36.0)
MCV: 95.2 fL (ref 80.0–100.0)
Monocytes Absolute: 0.7 10*3/uL (ref 0.1–1.0)
Monocytes Relative: 10 %
Neutro Abs: 4.4 10*3/uL (ref 1.7–7.7)
Neutrophils Relative %: 66 %
Platelets: 232 10*3/uL (ref 150–400)
RBC: 4.34 MIL/uL (ref 3.87–5.11)
RDW: 12.9 % (ref 11.5–15.5)
WBC: 6.8 10*3/uL (ref 4.0–10.5)
nRBC: 0 % (ref 0.0–0.2)

## 2020-11-07 LAB — COMPREHENSIVE METABOLIC PANEL
ALT: 18 U/L (ref 0–44)
AST: 26 U/L (ref 15–41)
Albumin: 4.3 g/dL (ref 3.5–5.0)
Alkaline Phosphatase: 58 U/L (ref 38–126)
Anion gap: 10 (ref 5–15)
BUN: 23 mg/dL (ref 8–23)
CO2: 27 mmol/L (ref 22–32)
Calcium: 9.7 mg/dL (ref 8.9–10.3)
Chloride: 102 mmol/L (ref 98–111)
Creatinine, Ser: 1.23 mg/dL — ABNORMAL HIGH (ref 0.44–1.00)
GFR, Estimated: 42 mL/min — ABNORMAL LOW (ref >60)
Glucose, Bld: 99 mg/dL (ref 70–99)
Potassium: 3.8 mmol/L (ref 3.5–5.1)
Sodium: 139 mmol/L (ref 135–145)
Total Bilirubin: 1 mg/dL (ref 0.3–1.2)
Total Protein: 7 g/dL (ref 6.5–8.1)

## 2020-11-07 LAB — LIPID PANEL
Cholesterol: 191 mg/dL (ref 0–200)
HDL: 54 mg/dL (ref >40)
LDL Cholesterol: 117 mg/dL — ABNORMAL HIGH (ref 0–99)
Total CHOL/HDL Ratio: 3.5 RATIO
Triglycerides: 98 mg/dL (ref <150)
VLDL: 20 mg/dL (ref 0–40)

## 2020-11-07 LAB — TSH: TSH: 2.522 u[IU]/mL (ref 0.350–4.500)

## 2020-11-07 LAB — VITAMIN B12: Vitamin B-12: 371 pg/mL (ref 180–914)

## 2020-11-07 LAB — T4, FREE: Free T4: 0.89 ng/dL (ref 0.61–1.12)

## 2020-11-07 NOTE — Patient Outreach (Signed)
[facility]) Care Management  11/07/2020  Kristen Pennington 1928/07/16 [id number]   Telephone Assessment  ED visit 4/25-[facility]  Outreach #1 RN attempted outreach call today however unsuccessful. RN able to leave a HIPAA approved voice message requesting a call back.   Will scheduled another outreach call over the next week for pending services.  [name], RN Care Management Coordinator [place] Office [phone]

## 2020-11-08 NOTE — Progress Notes (Signed)
Labs reviewed, will be discussed at upcoming visit.

## 2020-11-13 ENCOUNTER — Other Ambulatory Visit: Payer: Self-pay | Admitting: *Deleted

## 2020-11-13 NOTE — Patient Outreach (Signed)
[facility]) Care Management  11/13/2020  Kristen Pennington 02/24/1929 [id number]   Telephone Assessment-Unsuccessful  Outreach #2 RN attempted outreach call today however remains unsuccessful. RN able to leave a HIPAA approved voice message.   Will follow up once again over the next week.  [name], RN Care Management Coordinator [place] Office [phone]

## 2020-11-20 ENCOUNTER — Other Ambulatory Visit: Payer: Self-pay | Admitting: *Deleted

## 2020-11-20 NOTE — Patient Outreach (Signed)
[facility] [facility]) Care Management  11/20/2020  Kristen Pennington Jan 14, 1929 [id number]   Telephone Assessment-Request a call back  RN attempted outreach call today however pt was currently at the doctor's office and unable to talk. Pt has requested a call back at a later date.  Will rescheduled another outreach call over the next week.  [name], RN Care Management Coordinator [place] Office [phone]

## 2020-11-25 ENCOUNTER — Other Ambulatory Visit: Payer: Self-pay | Admitting: *Deleted

## 2020-11-25 NOTE — Patient Outreach (Signed)
[facility] [facility]) Care Management  11/25/2020  VICKY SCHLEICH [birth year] [id number]   Telephone Assessment Outreach #4  RN has made several attempts however unsuccessful. RN able to leave a HIPAA approved voice message requesting a call back.  Will follow up next month with another outreach call for pending services.  [name], RN Care Management Coordinator [place] Office [phone]

## 2020-11-26 ENCOUNTER — Telehealth: Payer: Self-pay | Admitting: Internal Medicine

## 2020-11-26 NOTE — Progress Notes (Signed)
  Chronic Care Management   Outreach Note  11/26/2020 Name: JENNIFE ZAUCHA MRN: [id number] DOB: [birth year]  Referred by: Lavera Guise, MD Reason for referral : No chief complaint on file.   An unsuccessful telephone outreach was attempted today. The patient was referred to the pharmacist for assistance with care management and care coordination.   Follow Up Plan:   Carley Perdue [employer] Scheduler

## 2020-11-28 ENCOUNTER — Other Ambulatory Visit: Payer: Self-pay | Admitting: Internal Medicine

## 2020-11-28 DIAGNOSIS — I10 Essential (primary) hypertension: Secondary | ICD-10-CM

## 2020-12-02 ENCOUNTER — Telehealth: Payer: Self-pay

## 2020-12-02 ENCOUNTER — Ambulatory Visit (INDEPENDENT_AMBULATORY_CARE_PROVIDER_SITE_OTHER): Payer: Medicare HMO | Admitting: Nurse Practitioner

## 2020-12-02 ENCOUNTER — Other Ambulatory Visit: Payer: Self-pay

## 2020-12-02 ENCOUNTER — Encounter: Payer: Self-pay | Admitting: Nurse Practitioner

## 2020-12-02 VITALS — BP 144/82 | HR 82 | Temp 98.4°F | Resp 16 | Ht 65.0 in | Wt 138.2 lb

## 2020-12-02 DIAGNOSIS — S29012A Strain of muscle and tendon of back wall of thorax, initial encounter: Secondary | ICD-10-CM

## 2020-12-02 DIAGNOSIS — R300 Dysuria: Secondary | ICD-10-CM | POA: Diagnosis not present

## 2020-12-02 DIAGNOSIS — K5900 Constipation, unspecified: Secondary | ICD-10-CM | POA: Diagnosis not present

## 2020-12-02 LAB — POCT URINALYSIS DIPSTICK
Bilirubin, UA: NEGATIVE
Blood, UA: NEGATIVE
Glucose, UA: NEGATIVE
Ketones, UA: NEGATIVE
Leukocytes, UA: NEGATIVE
Nitrite, UA: NEGATIVE
Protein, UA: NEGATIVE
Spec Grav, UA: 1.01 (ref 1.010–1.025)
Urobilinogen, UA: 0.2 E.U./dL
pH, UA: 5 (ref 5.0–8.0)

## 2020-12-02 MED ORDER — CYCLOBENZAPRINE HCL 10 MG PO TABS: 10.0000 mg | ORAL_TABLET | Freq: Every day | ORAL | 0 refills | Status: DC

## 2020-12-02 MED ORDER — POLYETHYLENE GLYCOL 3350 17 GM/SCOOP PO POWD: 17.0000 g | Freq: Every day | ORAL | 1 refills | Status: DC | PRN

## 2020-12-02 MED ORDER — CYCLOBENZAPRINE HCL 10 MG PO TABS: 10.0000 mg | ORAL_TABLET | Freq: Every day | ORAL | 1 refills | Status: DC

## 2020-12-02 NOTE — Progress Notes (Signed)
[facility] [place], [place] [address]  Internal MEDICINE  Office Visit Note  Patient Name: Kristen Pennington  [id number]  [id number]  Date of Service: 12/02/2020  Chief Complaint  Patient presents with  . Acute Visit    Poss bladder infection, constipation     HPI She presents for an acute sick visit for possible bladder infection and constipation she has low back pain bilaterally without sciatica.  She believes she pulled a muscle in her back when she was lifting some heavy things around the house. -Patient complaints of dysuria, urinary hesitancy, urinary frequency and constipation. She has not tried anything for the constipation       Current Medication: Outpatient Encounter Medications as of 12/02/2020  Medication Sig  . acetaminophen (TYLENOL) 500 MG tablet Take 500 mg by mouth every 6 (six) hours as needed.  . bisoprolol-hydrochlorothiazide (ZIAC) 5-6.25 MG tablet TAKE 1 TABLET BY MOUTH DAILY  . Carboxymethylcellulose Sodium (THERATEARS OP) Apply to eye.  . Cholecalciferol (VITAMIN D3) 5000 units TABS Take 5,000 Units by mouth daily.  Marland Kitchen dicyclomine (BENTYL) 10 MG capsule Take 1 capsule (10 mg total) by mouth 4 (four) times daily -  before meals and at bedtime.  Marland Kitchen doxycycline (VIBRA-TABS) 100 MG tablet Take 1 tablet (100 mg total) by mouth 2 (two) times daily.  Marland Kitchen escitalopram (LEXAPRO) 20 MG tablet Take 1 tablet (20 mg total) by mouth daily.  Marland Kitchen estradiol (ESTRACE) 0.1 MG/GM vaginal cream Use once a week as needed  . fluticasone (FLONASE) 50 MCG/ACT nasal spray 1 SPRAY IN EACH NOSTRIL ONCE DAILY AS NEEDED  . hydrOXYzine (ATARAX/VISTARIL) 10 MG tablet Take 1 tablet (10 mg total) by mouth 3 (three) times daily as needed for anxiety. For severe anxiety attacks only (Patient taking differently: Take 5 mg by mouth 3 (three) times daily as needed for anxiety. For severe anxiety attacks only)  . Liniments (SALONPAS PAIN RELIEF PATCH EX) Apply 1 patch  topically 2 (two) times daily.  Marland Kitchen lubiprostone (AMITIZA) 8 MCG capsule Take 1 capsule po bid prn ibs constipation.  . meloxicam (MOBIC) 7.5 MG tablet TAKE 1 TABLET BY MOUTH DAILY  . Multiple Vitamins-Minerals (CENTRUM SILVER 50+WOMEN) TABS Take by mouth.  . oxybutynin (DITROPAN-XL) 10 MG 24 hr tablet Take 1 tablet (10 mg total) by mouth daily.  . pantoprazole (PROTONIX) 40 MG tablet Take 1 tablet (40 mg total) by mouth daily.  Vladimir Faster Glycol-Propyl Glycol (SYSTANE) 0.4-0.3 % SOLN Apply to eye.  . promethazine (PHENERGAN) 25 MG tablet Take 25 mg by mouth as needed.   Marland Kitchen rOPINIRole (REQUIP) 1 MG tablet TAKE 1 TABLET BY MOUTH THREE TIMES DAILYAS NEEDED FOR RESTLESS LEGS  . simethicone (MYLICON) 80 MG chewable tablet Chew 80 mg by mouth every 6 (six) hours as needed for flatulence.  . traZODone (DESYREL) 50 MG tablet TAKE 1/2 TO 1 TABLET BY MOUTH AT BEDTIMEAS DIRECTED, STOP TAKING REMERON (MIRTAZAPINE)  . XIIDRA 5 % SOLN Apply to eye. Sample given by eye dr   No facility-administered encounter medications on file as of 12/02/2020.    Surgical History: Past Surgical History:  Procedure Laterality Date  . COLONOSCOPY  05-03-15  . COLONOSCOPY WITH PROPOFOL N/A 05/03/2015   Procedure: COLONOSCOPY WITH PROPOFOL;  Surgeon: Lucilla Lame, MD;  Location: [place];  Service: Endoscopy;  Laterality: N/A;  CPAP  . DILATION AND CURETTAGE OF UTERUS  1970  . ENDOMETRIAL BIOPSY    . EYE SURGERY Right 2017  .  POLYPECTOMY  05/03/2015   Procedure: POLYPECTOMY;  Surgeon: Lucilla Lame, MD;  Location: [place];  Service: Endoscopy;;  . ROTATOR CUFF REPAIR  2007  . SPINE SURGERY      Medical History: Past Medical History:  Diagnosis Date  . Arthritis   . Atrophic vaginitis 03/06/2013   Last Assessment & Plan:  She will plan to continue estradiol vaginal cream.  Prescription was rewritten stipulating use of the generic product.   . Atypical migraine 03/06/2013   Last Assessment & Plan:   Since she rarely takes sumatriptan, we will discontinue it. Continue sparing use of OTC migraine products.   . Avitaminosis D 03/06/2013   Last Assessment & Plan:  Recheck vitamin D level. Plan to continue vitamin D supplementation.   . Ceratitis 08/01/2013   Last Assessment & Plan:  Because of the expense, she will discontinue Restasis. She will use over-the-counter moisturizing eyedrops and liquid gel.   . Colon polyp   . 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   . 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   . Cystocele, midline 03/06/2013  . Demoralization and apathy 03/06/2013   Last Assessment & Plan:  Since she has been taking bupropion only once a day, I will write the prescription with the correct instructions and correct quantity. She has done well on this for years and she is encouraged to take it regularly   . Depression   . Environmental allergies   . Epistaxis   . Essential (primary) hypertension 05/17/2013   Last Assessment & Plan:  Her blood pressure is well-controlled. We will plan to continue hydrochlorothiazide and benazepril.  Both are generic and should be affordable on her health plan.   Marland Kitchen GERD (gastroesophageal reflux disease)   . Hemorrhoid   . Inflammation of sacroiliac joint ([place]) 03/06/2013   Last Assessment & Plan:  She is doing well on her present regimen of fentanyl patch supplemented with sparing use of tramadol/acetaminophen. We will continue that regimen.   . Sinusitis   . Sleep apnea    CPAP    Family History: Family History  Problem Relation Age of Onset  . Arthritis Mother   . Alzheimer's disease Father   . Heart disease Sister   . Cancer Brother   . Ovarian cancer Neg Hx   . Breast cancer Neg Hx   . Colon cancer Neg Hx   . Diabetes Neg Hx     Social History    Socioeconomic History  . Marital status: Widowed    Spouse name: Not on file  . Number of children: 2  . Years of education: Not on file  . Highest education level: Some college, no degree  Occupational History  . Not on file  Tobacco Use  . Smoking status: Never Smoker  . Smokeless tobacco: Never Used  Vaping Use  . Vaping Use: Never used  Substance and Sexual Activity  . Alcohol use: No    Alcohol/week: 0.0 standard drinks  . Drug use: No  . Sexual activity: Not Currently  Other Topics Concern  . Not on file  Social History Narrative  . Not on file   Social Determinants of Health   Financial Resource Strain: Not on file  Food Insecurity: Not on file  Transportation Needs: Not on file  Physical Activity: Not  on file  Stress: Not on file  Social Connections: Not on file  Intimate Partner Violence: Not on file      Review of Systems  Constitutional: Negative.  Negative for chills, fatigue and fever.  HENT: Negative.   Respiratory: Negative.  Negative for cough and shortness of breath.   Cardiovascular: Negative.  Negative for chest pain.  Gastrointestinal: Positive for constipation. Negative for abdominal pain, diarrhea, nausea, rectal pain and vomiting.  Genitourinary: Positive for dysuria, frequency and urgency. Negative for flank pain, pelvic pain, vaginal bleeding, vaginal discharge and vaginal pain.  Musculoskeletal: Positive for back pain and myalgias.  Psychiatric/Behavioral: Negative.     Vital Signs: BP (!) 144/82   Pulse 82   Temp 98.4 F (36.9 C)   Resp 16   Ht 5\' 5"  (1.651 m)   Wt 138 lb 3.2 oz (62.7 kg)   SpO2 97%   BMI 23.00 kg/m    Physical Exam Vitals reviewed.  Constitutional:      General: She is not in acute distress.    Appearance: Normal appearance. She is normal weight. She is not ill-appearing.  HENT:     Head: Normocephalic and atraumatic.  Cardiovascular:     Rate and Rhythm: Normal rate and regular rhythm.     Pulses:  Normal pulses.     Heart sounds: Normal heart sounds.  Pulmonary:     Effort: Pulmonary effort is normal. No respiratory distress.     Breath sounds: Normal breath sounds.  Abdominal:     General: Bowel sounds are normal.     Palpations: Abdomen is soft. There is no mass.     Tenderness: There is no abdominal tenderness. There is no right CVA tenderness, left CVA tenderness, guarding or rebound.     Hernia: No hernia is present.  Musculoskeletal:     Lumbar back: Spasms present.  Skin:    General: Skin is warm and dry.     Capillary Refill: Capillary refill takes less than 2 seconds.  Neurological:     Mental Status: She is alert and oriented to person, place, and time.  Psychiatric:        Mood and Affect: Mood normal.        Behavior: Behavior normal.        Assessment/Plan: 1. Muscle strain of left upper back, initial encounter Patient was moving around heavy items at home, a few days ago, and felt something pull in her back. Likely a muscle strain, accompanied by spasms, flexeril prescribed to relax the muscles in that area. - cyclobenzaprine (FLEXERIL) 10 MG tablet; Take 1 tablet (10 mg total) by mouth at bedtime. Take one tab po qhs for back spasm prn only  Dispense: 30 tablet; Refill: 0  2. Constipation, unspecified constipation type Having constipation, she is on some medications that can cause constipation as a side effect. She is not consistently taking any medications to treat constipation. Miralax prescribed.  - polyethylene glycol powder (GLYCOLAX/MIRALAX) 17 GM/SCOOP powder; Take 17 g by mouth daily as needed for mild constipation or moderate constipation.  Dispense: 3350 g; Refill: 1  3. Dysuria Urinalysis negative for infection.  - POCT Urinalysis Dipstick   General Counseling: tami blass understanding of the findings of todays visit and agrees with plan of treatment. I have discussed any further diagnostic evaluation that may be needed or ordered today.  We also reviewed her medications today. she has been encouraged to call the office with any questions or concerns that should  arise related to todays visit.    Orders Placed This Encounter  Procedures  . POCT Urinalysis Dipstick    No orders of the defined types were placed in this encounter.  Return in about 2 weeks (around 12/16/2020) for F/U discuss medications, please bring all medications to appointment, [place] PCP.  Total time spent: 30 Minutes Time spent includes review of chart, medications, test results, and follow up plan with the patient.   [place] Controlled Substance Database was reviewed by me.  This patient was seen by Jonetta Osgood, FNP-C in collaboration with Dr. Clayborn Bigness as a part of collaborative care agreement.   Dr Lavera Guise Internal medicine

## 2020-12-02 NOTE — Telephone Encounter (Signed)
Attempted to contact patient to set up appointment per [facility]. No answer, voicemail not set up. Unable to leave msg-Toni

## 2020-12-02 NOTE — Patient Instructions (Addendum)
Take flexeril 10mg  at bedtime.  Do not take your oxybutynin for 2-3 days. If no improvement in bladder emptying, call the clinic.  Take 1 cap-full (17g) of miralax mixed in water or juice daily as needed for constipation.

## 2020-12-02 NOTE — Progress Notes (Deleted)
dip 

## 2020-12-04 ENCOUNTER — Telehealth: Payer: Self-pay | Admitting: Internal Medicine

## 2020-12-04 NOTE — Chronic Care Management (AMB) (Signed)
  Chronic Care Management   Outreach Note  12/04/2020 Name: Kristen Pennington MRN: [id number] DOB: 1928/10/11  Referred by: Lavera Guise, MD Reason for referral : No chief complaint on file.   A second unsuccessful telephone outreach was attempted today. The patient was referred to pharmacist for assistance with care management and care coordination.  Follow Up Plan:   Tatjana Dellinger [employer] scheduler

## 2020-12-05 ENCOUNTER — Telehealth: Payer: Self-pay

## 2020-12-05 ENCOUNTER — Emergency Department: Payer: Medicare HMO

## 2020-12-05 ENCOUNTER — Emergency Department
Admission: EM | Admit: 2020-12-05 | Discharge: 2020-12-05 | Disposition: A | Discharge status: Home / Self Care | Payer: Medicare HMO | Attending: Emergency Medicine | Admitting: Emergency Medicine

## 2020-12-05 ENCOUNTER — Other Ambulatory Visit: Payer: Self-pay

## 2020-12-05 DIAGNOSIS — Z79899 Other long term (current) drug therapy: Secondary | ICD-10-CM | POA: Insufficient documentation

## 2020-12-05 DIAGNOSIS — N3000 Acute cystitis without hematuria: Secondary | ICD-10-CM | POA: Diagnosis not present

## 2020-12-05 DIAGNOSIS — I10 Essential (primary) hypertension: Secondary | ICD-10-CM | POA: Insufficient documentation

## 2020-12-05 DIAGNOSIS — B9689 Other specified bacterial agents as the cause of diseases classified elsewhere: Secondary | ICD-10-CM | POA: Diagnosis not present

## 2020-12-05 DIAGNOSIS — Z85038 Personal history of other malignant neoplasm of large intestine: Secondary | ICD-10-CM | POA: Diagnosis not present

## 2020-12-05 DIAGNOSIS — M545 Low back pain, unspecified: Secondary | ICD-10-CM

## 2020-12-05 LAB — URINALYSIS, COMPLETE (UACMP) WITH MICROSCOPIC
Bilirubin Urine: NEGATIVE
Glucose, UA: NEGATIVE mg/dL
Hgb urine dipstick: NEGATIVE
Ketones, ur: NEGATIVE mg/dL
Nitrite: NEGATIVE
Protein, ur: NEGATIVE mg/dL
Specific Gravity, Urine: 1.012 (ref 1.005–1.030)
pH: 6 (ref 5.0–8.0)

## 2020-12-05 MED ORDER — CEPHALEXIN 500 MG PO CAPS: 500.0000 mg | ORAL_CAPSULE | Freq: Four times a day (QID) | ORAL | 0 refills | Status: AC

## 2020-12-05 MED ORDER — CEPHALEXIN 500 MG PO CAPS
1000.0000 mg | ORAL_CAPSULE | Freq: Once | ORAL | Status: AC
  Administered 2020-12-05: 1000 mg via ORAL
  Filled 2020-12-05: qty 2

## 2020-12-05 MED ORDER — ACETAMINOPHEN 325 MG PO TABS
650.0000 mg | ORAL_TABLET | Freq: Once | ORAL | Status: AC
  Administered 2020-12-05: 650 mg via ORAL
  Filled 2020-12-05: qty 2

## 2020-12-05 NOTE — Discharge Instructions (Addendum)
Please use Tylenol, up to 650 mg 4x daily as needed for your back pain.  You may also use over-the-counter extra strength lidocaine patches to apply to the low back.  You have also been prescribed Keflex, an antibiotic to take for your UTI.  Please follow-up with your primary care regarding needing this medication.  Return to the emergency department if you experience any fever, chills, worsening of your back pain, weakness in your legs or any other new severe symptoms.  Otherwise, follow-up with your primary care.

## 2020-12-05 NOTE — ED Triage Notes (Signed)
Pt to ER via POV with complaints of constant back pain. Reports having back surgery in 200/2006.   Reports falling 3-4 weeks ago and possibly straining a muscle in her back. States she was seen by her PCP and prescribed a muscle relaxer for muscle spasms but has had no relief. Denies radiation. Reports some constipation. Pt was also told she has restless legs.

## 2020-12-05 NOTE — ED Notes (Signed)
Per nicole in lab, will add on urine culture to previously sent urine specimen.

## 2020-12-05 NOTE — Telephone Encounter (Signed)
Pt called stating she was in severe pain in her back.  She advised she had possibly pulled a muscle 3-4 weeks ago.  Pt was seen for this on 12/02/20 but pain wasn't that bad.  Pt was asking where she should go to be seen bc of her new insurance she wasn't sure about who she could see.  I informed pt that if her pain was that severe she should go to the hospital but I told pt to hold so I could consult with a provider, I came back to phone and pt had hung up phone and I tried to call back several times (3) but got no answer.

## 2020-12-05 NOTE — ED Notes (Signed)
Pt verbalized understanding of d/c instructions at this time. Prescriptions and follow-up care reviewed at this time. Pt ambulatory to ED lobby to wait for ride, NAD noted.

## 2020-12-05 NOTE — Telephone Encounter (Signed)
error 

## 2020-12-05 NOTE — ED Provider Notes (Signed)
[facility] Emergency Department Provider Note  ____________________________________________   Event Date/Time   First MD Initiated Contact with Patient 12/05/20 1546     (approximate)  I have reviewed the triage vital signs and the nursing notes.   HISTORY  Chief Complaint Back Pain   HPI Kristen Pennington is a 87 y.o. female who presents to the emergency department for evaluation of back pain.  Patient reports pain has been worsening over the last several days.  She did have remote fall about 3 to 4 weeks ago in which she felt herself going down and had a controlled maneuver to the ground.  She denies any immediate pain at that time, however the next day developed some back pain and was diagnosed with a muscle strain by her PCP.  She was given muscle relaxers reportedly without any significant improvement.  She also reports remote history of spinal surgery in 2005 or 2006.  She reports some dysuria present over the last week or so.  States she was tested 2 days ago for UTI and was told that she was negative.  She denies any radiation of symptoms into the bilateral legs, but does report history of restless leg syndrome.  She denies any new urinary or fecal incontinence, but does have previous stress urinary incontinence.  Denies any saddle anesthesias, abdominal pain, fevers.        Past Medical History:  Diagnosis Date  . Arthritis   . Atrophic vaginitis 03/06/2013   Last Assessment & Plan:  She will plan to continue estradiol vaginal cream.  Prescription was rewritten stipulating use of the generic product.   . Atypical migraine 03/06/2013   Last Assessment & Plan:  Since she rarely takes sumatriptan, we will discontinue it. Continue sparing use of OTC migraine products.   . Avitaminosis D 03/06/2013   Last Assessment & Plan:  Recheck vitamin D level. Plan to continue vitamin D supplementation.   . Ceratitis 08/01/2013   Last Assessment & Plan:  Because of  the expense, she will discontinue Restasis. She will use over-the-counter moisturizing eyedrops and liquid gel.   . Colon polyp   . 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   . 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   . Cystocele, midline 03/06/2013  . Demoralization and apathy 03/06/2013   Last Assessment & Plan:  Since she has been taking bupropion only once a day, I will write the prescription with the correct instructions and correct quantity. She has done well on this for years and she is encouraged to take it regularly   . Depression   . Environmental allergies   . Epistaxis   . Essential (primary) hypertension 05/17/2013   Last Assessment & Plan:  Her blood pressure is well-controlled. We will plan to continue hydrochlorothiazide and benazepril.  Both are generic and should be affordable on her health plan.   Marland Kitchen GERD (gastroesophageal reflux disease)   . Hemorrhoid   . Inflammation of sacroiliac joint ([place]) 03/06/2013   Last Assessment & Plan:  She is doing well on her present regimen of fentanyl patch supplemented with sparing use of tramadol/acetaminophen. We will continue that regimen.   . Sinusitis   . Sleep apnea    CPAP    Patient Active Problem List   Diagnosis Date Noted  .  Memory problem 08/29/2020  . Chronic shoulder pain (Left) 06/03/2020  . Arthralgia of shoulder region (Left) 06/03/2020  . Osteoarthritis of shoulder (Left) 06/03/2020  . 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 Acute shoulder pain (Left) 05/30/2020  . Chronic low back pain (Midline) w/o sciatica 05/15/2020  . Spinal enthesopathy of lumbar region ([place]) 05/15/2020  . 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 Long term prescription opiate use 04/29/2020  . Encounter for general adult medical examination with abnormal findings  03/20/2020  . Hyperkalemia 03/20/2020  . Hypercalcemia 03/20/2020  . Abnormal kidney function 03/20/2020  . Abnormal MRI, lumbar spine (01/08/2020) 02/14/2020  . Lumbar facet hypertrophy 02/14/2020  . MDD (major depressive disorder), recurrent, in full remission ([name]) 12/18/2019  . Osteoarthritis of hips (Bilateral) 11/20/2019  . Lumbar facet arthropathy 11/20/2019  . 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 Primary osteoarthritis involving multiple joints 11/20/2019  . OSA on CPAP 10/26/2019  . MDD (major depressive disorder), recurrent, in partial remission ([name]) 09/19/2019  . Bereavement 09/19/2019  . Chronic pain syndrome 08/21/2019  . Pharmacologic therapy 08/21/2019  . Disorder of skeletal system 08/21/2019  . Problems influencing health status 08/21/2019  . History of lumbar spinal fusion 08/21/2019  . Chronic hip pain (2ry area of Pain) (Bilateral) (R>L) 08/21/2019  . Lumbar facet syndrome (Bilateral) 08/21/2019  . Dysuria 07/02/2019  . Delusional disorder ([place]) 03/08/2019  . MDD (major depressive disorder), recurrent episode, mild ([name]) 01/18/2019  . 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 History of delusional disorder 01/18/2019  . Gastroesophageal reflux disease without esophagitis 11/27/2018  . Irritable bowel syndrome with constipation 11/27/2018  . Chronic low back pain (1ry area of Pain) (Bilateral) (R>L) w/o sciatica 08/17/2018  . Vitamin B12 deficiency 08/17/2018  . Elevated TSH 08/17/2018  . Urinary tract infection without hematuria 08/16/2017  . Generalized anxiety disorder 08/16/2017  . Primary generalized (osteo)arthritis 08/16/2017  . Melena 12/05/2015  . Blood in stool   . Benign neoplasm of transverse colon   . Benign neoplasm of cecum   . Ceratitis 08/01/2013  . Keratitis sicca ([place]) 08/01/2013  . Essential (primary) hypertension 05/17/2013  . Cystocele, midline 03/06/2013  . Demoralization and apathy 03/06/2013  . Atypical  migraine 03/06/2013  . Hyperlipidemia 03/06/2013  . Jittery 03/06/2013  . 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 Atrophic vaginitis 03/06/2013  . Inflammation of sacroiliac joint ([place]) 03/06/2013  . Avitaminosis D 03/06/2013  . Allergic rhinitis due to pollen 03/06/2013  . Body mass index between 19-24, adult 03/06/2013  . Breast screening 03/06/2013  . 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 Screening for depression 03/06/2013    Past Surgical History:  Procedure Laterality Date  . COLONOSCOPY  05-03-15  . COLONOSCOPY WITH PROPOFOL N/A 05/03/2015   Procedure: COLONOSCOPY WITH PROPOFOL;  Surgeon: Lucilla Lame, MD;  Location: [facility];  Service: Endoscopy;  Laterality: N/A;  CPAP  . DILATION AND CURETTAGE OF UTERUS  1970  . ENDOMETRIAL BIOPSY    . EYE SURGERY Right 2017  . POLYPECTOMY  05/03/2015   Procedure: POLYPECTOMY;  Surgeon: Lucilla Lame, MD;  Location: [facility];  Service: Endoscopy;;  . ROTATOR CUFF REPAIR  2007  . SPINE SURGERY      Prior to Admission medications   Medication Sig Start Date End Date Taking? Authorizing Provider  cephALEXin (KEFLEX) 500 MG capsule Take 1 capsule (500 mg total) by mouth 4 (four) times daily for 10 days. 12/05/20 12/15/20 Yes Marlana Salvage,  PA  acetaminophen (TYLENOL) 500 MG tablet Take 500 mg by mouth every 6 (six) hours as needed.    [provider]  bisoprolol-hydrochlorothiazide [facility]) 5-6.25 MG tablet TAKE 1 TABLET BY MOUTH DAILY 11/29/20   McDonough, Si Gaul, PA-C  Carboxymethylcellulose Sodium (THERATEARS OP) Apply to eye.    [provider]  Cholecalciferol (VITAMIN D3) 5000 units TABS Take 5,000 Units by mouth daily.    [provider]  cyclobenzaprine (FLEXERIL) 10 MG tablet Take 1 tablet (10 mg total) by mouth at bedtime. Take one tab po qhs for back spasm prn only 12/02/20   Jonetta Osgood, NP  dicyclomine (BENTYL) 10 MG capsule  Take 1 capsule (10 mg total) by mouth 4 (four) times daily -  before meals and at bedtime. 10/07/20 12/06/20  McDonough, Si Gaul, PA-C  escitalopram (LEXAPRO) 20 MG tablet Take 1 tablet (20 mg total) by mouth daily. 09/26/20   Ursula Alert, MD  estradiol (ESTRACE) 0.1 MG/GM vaginal cream Use once a week as needed 06/28/20   Lavera Guise, MD  fluticasone [facility]) 50 MCG/ACT nasal spray 1 SPRAY IN EACH NOSTRIL ONCE DAILY AS NEEDED 11/04/20   Lavera Guise, MD  hydrOXYzine (ATARAX/VISTARIL) 10 MG tablet Take 1 tablet (10 mg total) by mouth 3 (three) times daily as needed for anxiety. For severe anxiety attacks only Patient taking differently: Take 5 mg by mouth 3 (three) times daily as needed for anxiety. For severe anxiety attacks only 05/21/20   Ursula Alert, MD  Liniments (SALONPAS PAIN RELIEF PATCH EX) Apply 1 patch topically 2 (two) times daily.    [provider]  lubiprostone (AMITIZA) 8 MCG capsule Take 1 capsule po bid prn ibs constipation. 10/07/20   McDonough, Si Gaul, PA-C  meloxicam (MOBIC) 7.5 MG tablet TAKE 1 TABLET BY MOUTH DAILY 09/30/20   Lavera Guise, MD  Multiple Vitamins-Minerals (CENTRUM SILVER 50+WOMEN) TABS Take by mouth.    [provider]  oxybutynin (DITROPAN-XL) 10 MG 24 hr tablet Take 1 tablet (10 mg total) by mouth daily. 10/07/20   McDonough, Si Gaul, PA-C  pantoprazole (PROTONIX) 40 MG tablet Take 1 tablet (40 mg total) by mouth daily. 06/28/20   Lavera Guise, MD  Polyethyl Glycol-Propyl Glycol (SYSTANE) 0.4-0.3 % SOLN Apply to eye.    [provider]  polyethylene glycol powder (GLYCOLAX/MIRALAX) 17 GM/SCOOP powder Take 17 g by mouth daily as needed for mild constipation or moderate constipation. 12/02/20   Jonetta Osgood, NP  promethazine (PHENERGAN) 25 MG tablet Take 25 mg by mouth as needed.  07/26/13   [provider]  rOPINIRole (REQUIP) 1 MG tablet TAKE 1 TABLET BY MOUTH THREE TIMES DAILYAS NEEDED FOR RESTLESS LEGS 11/29/20    McDonough, Si Gaul, PA-C  simethicone (MYLICON) 80 MG chewable tablet Chew 80 mg by mouth every 6 (six) hours as needed for flatulence.    [provider]  traZODone (DESYREL) 50 MG tablet TAKE 1/2 TO 1 TABLET BY MOUTH AT BEDTIMEAS DIRECTED, STOP TAKING REMERON (MIRTAZAPINE) 10/01/20   Ursula Alert, MD  XIIDRA 5 % SOLN Apply to eye. Sample given by eye dr    [provider]    Allergies Patient has no known allergies.  Family History  Problem Relation Age of Onset  . Arthritis Mother   . Alzheimer's disease Father   . Heart disease Sister   . Cancer Brother   . Ovarian cancer Neg Hx   . Breast cancer Neg Hx   .  Colon cancer Neg Hx   . Diabetes Neg Hx     Social History Social History   Tobacco Use  . Smoking status: Never Smoker  . Smokeless tobacco: Never Used  Vaping Use  . Vaping Use: Never used  Substance Use Topics  . Alcohol use: No    Alcohol/week: 0.0 standard drinks  . Drug use: No    Review of Systems Constitutional: No fever/chills Eyes: No visual changes. ENT: No sore throat. Cardiovascular: Denies chest pain. Respiratory: Denies shortness of breath. Gastrointestinal: No abdominal pain.  No nausea, no vomiting.  No diarrhea.  No constipation. Genitourinary: + dysuria. Musculoskeletal: + back pain. Skin: Negative for rash. Neurological: Negative for headaches, focal weakness or numbness.  ____________________________________________   PHYSICAL EXAM:  VITAL SIGNS: ED Triage Vitals  Enc Vitals Group     BP 12/05/20 1244 (!) 156/99     Pulse Rate 12/05/20 1244 88     Resp 12/05/20 1244 16     Temp 12/05/20 1244 98.3 F (36.8 C)     Temp Source 12/05/20 1244 Oral     SpO2 12/05/20 1244 95 %     Weight 12/05/20 1253 140 lb (63.5 kg)     Height 12/05/20 1253 5\' 5"  (1.651 m)     Head Circumference --      Peak Flow --      Pain Score 12/05/20 1253 10     Pain Loc --      Pain Edu? --      Excl. in [place]? --     Constitutional: Alert and oriented. Well appearing and in no acute distress. Eyes: Conjunctivae are normal. PERRL. EOMI. Head: Atraumatic. Nose: No congestion/rhinnorhea. Mouth/Throat: Mucous membranes are moist.  Oropharynx non-erythematous. Neck: No stridor.   Cardiovascular: Normal rate, regular rhythm. Grossly normal heart sounds.   Respiratory: Normal respiratory effort.  No retractions. Lungs CTAB. Gastrointestinal: Soft and nontender. No distention. No abdominal bruits. No CVA tenderness. Musculoskeletal: There is tenderness to the midline and paraspinals of the lumbar spine.  5/5 strength in the bilateral lower extremities in ankle plantarflexion, dorsiflexion, knee flexion extension, hip flexion.  Positive straight leg raise bilaterally for increased pain.  Dorsal pedal pulses symmetric and strong. Neurologic:  Normal speech and language. No gross focal neurologic deficits are appreciated.  Skin:  Skin is warm, dry and intact. No rash noted. Psychiatric: Mood and affect are normal. Speech and behavior are normal.  ____________________________________________   LABS (all labs ordered are listed, but only abnormal results are displayed)  Labs Reviewed  URINALYSIS, COMPLETE (UACMP) WITH MICROSCOPIC - Abnormal; Notable for the following components:      Result Value   Color, Urine YELLOW (*)    APPearance CLEAR (*)    Leukocytes,Ua LARGE (*)    Bacteria, UA RARE (*)    All other components within normal limits  URINE CULTURE   ____________________________________________  RADIOLOGY Lenoria Farrier, personally viewed and evaluated these images (plain radiographs) as part of my medical decision making, as well as reviewing the written report by the radiologist.  ED provider interpretation: No evidence of acute fracture noted  Official radiology report(s): DG Lumbar Spine 2-3 Views  Result Date: 12/05/2020 CLINICAL DATA:  Low back pain. EXAM: LUMBAR SPINE - 2-3 VIEW  COMPARISON:  10/26/2019 FINDINGS: Bones are diffusely demineralized. No evidence for an acute fracture. Posterior fusion hardware again noted L4-5. No evidence for hardware complication. Degenerative disc disease noted at L2-3 and L5-S1. IMPRESSION: 1.  No acute bony abnormality. 2. Degenerative disc disease at L2-3 and L5-S1. Electronically Signed   By: Misty Stanley M.D.   On: 12/05/2020 17:25   ____________________________________________   INITIAL IMPRESSION / ASSESSMENT AND PLAN / ED COURSE  As part of my medical decision making, I reviewed the following data within the [place] notes reviewed and incorporated, Labs reviewed, Radiograph reviewed and Notes from prior ED visits        Patient is an 87 year old female who presents to the emergency department today for evaluation of back pain that has been intermittent over the last month or so with acute worsening in the last few days.  See HPI for further details.  She does endorse dysuria, denies fever, chills or other systemic symptoms.  She has significant history of chronic back pain.  In triage, patient is mildly hypertensive but otherwise has normal vital signs.  On physical exam she has tenderness to the midline and paraspinals of the lumbar spine but is neurovascularly intact distally with symmetric and strong pulses.  Given her complaint of dysuria, urinalysis was obtained which demonstrates rare bacteria with large leukocytes.  We will send this for culture.  Discussed with the patient that I cannot with the 440% certainty tell whether this is a musculoskeletal cause versus urinary causes of her symptoms.  Given worsening over the last month as well as her worsening with straight leg raises, suspect that there is largely musculoskeletal cause, however will treat for both.  Advised Keflex and sent this to his pharmacy and also encouraged lidocaine patches as directed on the box as well as Tylenol.  Recommended  close PCP follow-up.  Return precautions were discussed and she stable time for outpatient management.      ____________________________________________   FINAL CLINICAL IMPRESSION(S) / ED DIAGNOSES  Final diagnoses:  Acute bilateral low back pain without sciatica  Acute cystitis without hematuria     ED Discharge Orders         Ordered    cephALEXin (KEFLEX) 500 MG capsule  4 times daily        12/05/20 1901          *Please note:  KIRSTI MCALPINE was evaluated in Emergency Department on 12/05/2020 for the symptoms described in the history of present illness. She was evaluated in the context of the global COVID-19 pandemic, which necessitated consideration that the patient might be at risk for infection with the SARS-CoV-2 virus that causes COVID-19. Institutional protocols and algorithms that pertain to the evaluation of patients at risk for COVID-19 are in a state of rapid change based on information released by regulatory bodies including the CDC and federal and state organizations. These policies and algorithms were followed during the patient's care in the ED.  Some ED evaluations and interventions may be delayed as a result of limited staffing during and the pandemic.*   Note:  This document was prepared using Dragon voice recognition software and may include unintentional dictation errors.   Marlana Salvage, PA 12/05/20 2112    Delman Kitten, MD 12/06/20 [phone]

## 2020-12-05 NOTE — ED Notes (Signed)
See triage note  States she lifted something heavy about 3 week ago  Has had lower back pain since

## 2020-12-07 LAB — URINE CULTURE

## 2020-12-10 ENCOUNTER — Ambulatory Visit (INDEPENDENT_AMBULATORY_CARE_PROVIDER_SITE_OTHER): Payer: Medicare HMO | Admitting: Psychiatry

## 2020-12-10 ENCOUNTER — Other Ambulatory Visit: Payer: Self-pay

## 2020-12-10 ENCOUNTER — Encounter: Payer: Self-pay | Admitting: Psychiatry

## 2020-12-10 DIAGNOSIS — R413 Other amnesia: Secondary | ICD-10-CM

## 2020-12-10 DIAGNOSIS — F411 Generalized anxiety disorder: Secondary | ICD-10-CM

## 2020-12-10 DIAGNOSIS — F5105 Insomnia due to other mental disorder: Secondary | ICD-10-CM

## 2020-12-10 DIAGNOSIS — F330 Major depressive disorder, recurrent, mild: Secondary | ICD-10-CM | POA: Diagnosis not present

## 2020-12-10 MED ORDER — TRAZODONE HCL 50 MG PO TABS: ORAL_TABLET | ORAL | 0 refills | Status: DC

## 2020-12-10 MED ORDER — ESCITALOPRAM OXALATE 20 MG PO TABS: 20.0000 mg | ORAL_TABLET | Freq: Every day | ORAL | 0 refills | Status: DC

## 2020-12-10 NOTE — Progress Notes (Signed)
Virtual Visit via Telephone Note  I connected with Kristen Pennington on 12/10/20 at 11:00 AM EDT by telephone and verified that I am speaking with the correct person using two identifiers.  Location Provider Location : [facility] Patient Location : Home  Participants: Patient , Provider    I discussed the limitations, risks, security and privacy concerns of performing an evaluation and management service by telephone and the availability of in person appointments. I also discussed with the patient that there may be a patient responsible charge related to this service. The patient expressed understanding and agreed to proceed.     I discussed the assessment and treatment plan with the patient. The patient was provided an opportunity to ask questions and all were answered. The patient agreed with the plan and demonstrated an understanding of the instructions.   The patient was advised to call back or seek an in-person evaluation if the symptoms worsen or if the condition fails to improve as anticipated.   [place] MD OP Progress Note  12/10/2020 12:39 PM Kristen Pennington  MRN:  [id number]  Chief Complaint:  Chief Complaint    Follow-up; Anxiety; Insomnia     HPI: Kristen Pennington is a 87 year old Caucasian female, widowed, lives in [place], has a history of MDD, GAD, delusional disorder, insomnia, hearing loss, vision loss, OSA, hypertension was evaluated by telemedicine today.  Patient today reports she forgot that she was supposed to come into the office for in person visit.  Patient reports she is currently struggling with back pain as well as possible urinary symptoms.  She was seen in the emergency department and was started on medications for the same.  She reports the pain continues to affect her sleep although she is improving.  She reports she is worried about her housing situation.  She reports she needs some support also and she has been talking with her family about that.  She  wants to move into another housing.  Patient denies any suicidality, homicidality.  She did not appear to be preoccupied with any delusions.  She appeared to be alert, oriented to person place time and situation.  Patient denies any other concerns today.  Visit Diagnosis:    ICD-10-CM   1. Generalized anxiety disorder  F41.1 escitalopram (LEXAPRO) 20 MG tablet    traZODone (DESYREL) 50 MG tablet  2. MDD (major depressive disorder), recurrent episode, mild ([place])  F33.0   3. Insomnia due to mental condition  F51.05   4. Memory problem  R41.3     Past Psychiatric History: I have reviewed past psychiatric history from progress note on 08/11/2018.  Past trials of Klonopin, Abilify, Lexapro, mirtazapine  Past Medical History:  Past Medical History:  Diagnosis Date  . Arthritis   . Atrophic vaginitis 03/06/2013   Last Assessment & Plan:  She will plan to continue estradiol vaginal cream.  Prescription was rewritten stipulating use of the generic product.   . Atypical migraine 03/06/2013   Last Assessment & Plan:  Since she rarely takes sumatriptan, we will discontinue it. Continue sparing use of OTC migraine products.   . Avitaminosis D 03/06/2013   Last Assessment & Plan:  Recheck vitamin D level. Plan to continue vitamin D supplementation.   . Ceratitis 08/01/2013   Last Assessment & Plan:  Because of the expense, she will discontinue Restasis. She will use over-the-counter moisturizing eyedrops and liquid gel.   . Colon polyp   . Combined fat and carbohydrate induced hyperlipemia 03/06/2013  Last Assessment & Plan:  Recheck fasting lipids.   . 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   . Cystocele, midline 03/06/2013  . Demoralization and apathy 03/06/2013   Last Assessment & Plan:  Since she has been taking bupropion only once a day, I will write the prescription with the  correct instructions and correct quantity. She has done well on this for years and she is encouraged to take it regularly   . Depression   . Environmental allergies   . Epistaxis   . Essential (primary) hypertension 05/17/2013   Last Assessment & Plan:  Her blood pressure is well-controlled. We will plan to continue hydrochlorothiazide and benazepril.  Both are generic and should be affordable on her health plan.   Marland Kitchen GERD (gastroesophageal reflux disease)   . Hemorrhoid   . Inflammation of sacroiliac joint ([place]) 03/06/2013   Last Assessment & Plan:  She is doing well on her present regimen of fentanyl patch supplemented with sparing use of tramadol/acetaminophen. We will continue that regimen.   . Sinusitis   . Sleep apnea    CPAP    Past Surgical History:  Procedure Laterality Date  . COLONOSCOPY  05-03-15  . COLONOSCOPY WITH PROPOFOL N/A 05/03/2015   Procedure: COLONOSCOPY WITH PROPOFOL;  Surgeon: Lucilla Lame, MD;  Location: [facility];  Service: Endoscopy;  Laterality: N/A;  CPAP  . DILATION AND CURETTAGE OF UTERUS  1970  . ENDOMETRIAL BIOPSY    . EYE SURGERY Right 2017  . POLYPECTOMY  05/03/2015   Procedure: POLYPECTOMY;  Surgeon: Lucilla Lame, MD;  Location: [facility];  Service: Endoscopy;;  . ROTATOR CUFF REPAIR  2007  . SPINE SURGERY      Family Psychiatric History: I have reviewed family psychiatric history from progress note from 08/11/2018  Family History:  Family History  Problem Relation Age of Onset  . Arthritis Mother   . Alzheimer's disease Father   . Heart disease Sister   . Cancer Brother   . Ovarian cancer Neg Hx   . Breast cancer Neg Hx   . Colon cancer Neg Hx   . Diabetes Neg Hx     Social History: I have reviewed social history from progress note on 08/11/2018 Social History   Socioeconomic History  . Marital status: Widowed    Spouse name: Not on file  . Number of children: 2  . Years of education: Not on file  . Highest education  level: Some college, no degree  Occupational History  . Not on file  Tobacco Use  . Smoking status: Never Smoker  . Smokeless tobacco: Never Used  Vaping Use  . Vaping Use: Never used  Substance and Sexual Activity  . Alcohol use: No    Alcohol/week: 0.0 standard drinks  . Drug use: No  . Sexual activity: Not Currently  Other Topics Concern  . Not on file  Social History Narrative  . Not on file   Social Determinants of Health   Financial Resource Strain: Not on file  Food Insecurity: Not on file  Transportation Needs: Not on file  Physical Activity: Not on file  Stress: Not on file  Social Connections: Not on file    Allergies: No Known Allergies  Metabolic Disorder Labs: Lab Results  Component Value Date   HGBA1C 5.4 08/01/2018   Lab Results  Component Value Date   PROLACTIN  8.1 08/01/2018   Lab Results  Component Value Date   CHOL 191 11/07/2020   TRIG 98 11/07/2020   HDL 54 11/07/2020   CHOLHDL 3.5 11/07/2020   VLDL 20 11/07/2020   LDLCALC 117 (H) 11/07/2020   LDLCALC 159 (H) 08/01/2018   Lab Results  Component Value Date   TSH 2.522 11/07/2020   TSH 2.830 08/10/2018    Therapeutic Level Labs: No results found for: LITHIUM No results found for: VALPROATE No components found for:  CBMZ  Current Medications: Current Outpatient Medications  Medication Sig Dispense Refill  . acetaminophen (TYLENOL) 500 MG tablet Take 500 mg by mouth every 6 (six) hours as needed.    . bisoprolol-hydrochlorothiazide (ZIAC) 5-6.25 MG tablet TAKE 1 TABLET BY MOUTH DAILY 30 tablet 3  . Carboxymethylcellulose Sodium (THERATEARS OP) Apply to eye.    . cephALEXin (KEFLEX) 500 MG capsule Take 1 capsule (500 mg total) by mouth 4 (four) times daily for 10 days. 40 capsule 0  . Cholecalciferol (VITAMIN D3) 5000 units TABS Take 5,000 Units by mouth daily.    . cyclobenzaprine (FLEXERIL) 10 MG tablet Take 1 tablet (10 mg total) by mouth at bedtime. Take one tab po qhs for back  spasm prn only 30 tablet 0  . dicyclomine (BENTYL) 10 MG capsule Take 1 capsule (10 mg total) by mouth 4 (four) times daily -  before meals and at bedtime. 120 capsule 1  . escitalopram (LEXAPRO) 20 MG tablet Take 1 tablet (20 mg total) by mouth daily. 90 tablet 0  . estradiol (ESTRACE) 0.1 MG/GM vaginal cream Use once a week as needed 42.5 g 1  . fluticasone (FLONASE) 50 MCG/ACT nasal spray 1 SPRAY IN EACH NOSTRIL ONCE DAILY AS NEEDED 16 g 1  . hydrOXYzine (ATARAX/VISTARIL) 10 MG tablet Take 1 tablet (10 mg total) by mouth 3 (three) times daily as needed for anxiety. For severe anxiety attacks only (Patient taking differently: Take 5 mg by mouth 3 (three) times daily as needed for anxiety. For severe anxiety attacks only) 90 tablet 3  . Liniments (SALONPAS PAIN RELIEF PATCH EX) Apply 1 patch topically 2 (two) times daily.    Marland Kitchen lubiprostone (AMITIZA) 8 MCG capsule Take 1 capsule po bid prn ibs constipation. 60 capsule 2  . meloxicam (MOBIC) 7.5 MG tablet TAKE 1 TABLET BY MOUTH DAILY 30 tablet 2  . Multiple Vitamins-Minerals (CENTRUM SILVER 50+WOMEN) TABS Take by mouth.    . oxybutynin (DITROPAN-XL) 10 MG 24 hr tablet Take 1 tablet (10 mg total) by mouth daily. 30 tablet 2  . pantoprazole (PROTONIX) 40 MG tablet Take 1 tablet (40 mg total) by mouth daily. 90 tablet 1  . Polyethyl Glycol-Propyl Glycol (SYSTANE) 0.4-0.3 % SOLN Apply to eye.    . polyethylene glycol powder (GLYCOLAX/MIRALAX) 17 GM/SCOOP powder Take 17 g by mouth daily as needed for mild constipation or moderate constipation. 3350 g 1  . promethazine (PHENERGAN) 25 MG tablet Take 25 mg by mouth as needed.     Marland Kitchen rOPINIRole (REQUIP) 1 MG tablet TAKE 1 TABLET BY MOUTH THREE TIMES DAILYAS NEEDED FOR RESTLESS LEGS 90 tablet 3  . simethicone (MYLICON) 80 MG chewable tablet Chew 80 mg by mouth every 6 (six) hours as needed for flatulence.    . traZODone (DESYREL) 50 MG tablet TAKE 1/2 TO 1 TABLET BY MOUTH AT BEDTIMEAS DIRECTED, STOP TAKING  REMERON (MIRTAZAPINE) 90 tablet 0  . XIIDRA 5 % SOLN Apply to eye. Sample given by eye dr  No current facility-administered medications for this visit.     Musculoskeletal: Strength & Muscle Tone: UTA Gait & Station: UTA Patient leans: N/A  Psychiatric Specialty Exam: Review of Systems  Musculoskeletal: Positive for back pain.  Psychiatric/Behavioral: The patient is nervous/anxious.   All other systems reviewed and are negative.   There were no vitals taken for this visit.There is no height or weight on file to calculate BMI.  General Appearance: UTA  Eye Contact:  UTA  Speech:  Clear and Coherent  Volume:  Normal  Mood:  Anxious  Affect:  UTA  Thought Process:  Goal Directed and Descriptions of Associations: Intact  Orientation:  Full (Time, Place, and Person)  Thought Content: Logical   Suicidal Thoughts:  No  Homicidal Thoughts:  No  Memory:  Immediate;   Fair Recent;   Fair Remote;   Fair  Judgement:  Fair  Insight:  Fair  Psychomotor Activity:  UTA  Concentration:  Concentration: Fair and Attention Span: Fair  Recall:  [employer] of Knowledge: Fair  Language: Fair  Akathisia:  No  Handed:  Right  AIMS (if indicated): UTA  Assets:  Communication Skills Desire for Improvement Housing  ADL's:  Intact  Cognition: WNL  Sleep:  Improving- but does have pain which does wake her up   Screenings: [place] from 03/07/2020 in [facility], [place] from 03/06/2019 in [facility], [place] from 03/02/2018 in [facility], [facility]  Total Score (max 30 points ) 30 30 27     PHQ2-9   [place] Visit from 12/02/2020 in [facility], [facility] Office Visit from 10/07/2020 in [facility], [facility] Video Visit from 08/29/2020 in [place] Visit from 06/10/2020 in [facility], [facility] Office Visit from 05/06/2020 in  [place]  PHQ-2 Total Score 0 2 1 0 0    [place] ED from 12/05/2020 in [facility] from 11/04/2020 in [place] Video Visit from 08/29/2020 in [place] No Risk No Risk No Risk       Assessment and Plan: Kristen Pennington is a 87 year old female who has a history of GAD, MDD, history of psychosis, essential hypertension, chronic pain, OSA, hearing loss, bereavement was evaluated by telemedicine today.  Patient is currently struggling with pain as well as possible urinary symptoms currently under treatment.  She will benefit from the following plan.  Plan GAD- improving Lexapro 20 mg p.o. daily Hydroxyzine 10 mg p.o. daily as needed for anxiety attacks  Insomnia- restless due to pain. She will benefit from sufficient pain management. Trazodone 25-50 mg manage nightly as needed  Memory problems-unstable Patient was referred to neurology-pending Review TSH-within normal limits dated 11/07/2020 Reviewed vitamin B12-371-within normal limits.  Patient to continue to follow-up with her primary provider .  I have reviewed notes per Dr. Jacqualine Code -emergency department-dated 12/05/2020-patient started on Keflex, lidocaine patches as well as Tylenol as needed for back pain, as well as possible UTI.  Follow-up in clinic in office in 6 to 8 weeks or sooner if needed.    I have spent at least 12 minutes non face to face with patient today .    This note was generated in part or whole with voice recognition software. Voice recognition is usually quite accurate but there are transcription errors  that can and very often do occur. I apologize for any typographical errors that were not detected and corrected.      Ursula Alert, MD 12/10/2020, 12:39 PM

## 2020-12-13 ENCOUNTER — Telehealth: Payer: Self-pay | Admitting: Internal Medicine

## 2020-12-13 NOTE — Chronic Care Management (AMB) (Signed)
  Chronic Care Management   Outreach Note  12/13/2020 Name: Kristen Pennington MRN: [id number] DOB: 25-Jul-1928  Referred by: Lavera Guise, MD Reason for referral : No chief complaint on file.   Third unsuccessful telephone outreach was attempted today. The patient was referred to the pharmacist for assistance with care management and care coordination.   Follow Up Plan:   Tatjana Dellinger Upstream Scheduler

## 2020-12-18 ENCOUNTER — Ambulatory Visit (INDEPENDENT_AMBULATORY_CARE_PROVIDER_SITE_OTHER): Payer: Medicare HMO | Admitting: Nurse Practitioner

## 2020-12-18 ENCOUNTER — Other Ambulatory Visit: Payer: Self-pay

## 2020-12-18 ENCOUNTER — Encounter: Payer: Self-pay | Admitting: Nurse Practitioner

## 2020-12-18 VITALS — BP 142/74 | HR 60 | Temp 98.5°F | Resp 16 | Ht 62.0 in | Wt 142.8 lb

## 2020-12-18 DIAGNOSIS — K581 Irritable bowel syndrome with constipation: Secondary | ICD-10-CM

## 2020-12-18 DIAGNOSIS — I10 Essential (primary) hypertension: Secondary | ICD-10-CM

## 2020-12-18 DIAGNOSIS — S29012A Strain of muscle and tendon of back wall of thorax, initial encounter: Secondary | ICD-10-CM

## 2020-12-18 DIAGNOSIS — M8949 Other hypertrophic osteoarthropathy, multiple sites: Secondary | ICD-10-CM

## 2020-12-18 DIAGNOSIS — M150 Primary generalized (osteo)arthritis: Secondary | ICD-10-CM

## 2020-12-18 DIAGNOSIS — M159 Polyosteoarthritis, unspecified: Secondary | ICD-10-CM

## 2020-12-18 DIAGNOSIS — F3341 Major depressive disorder, recurrent, in partial remission: Secondary | ICD-10-CM

## 2020-12-18 MED ORDER — CYCLOBENZAPRINE HCL 10 MG PO TABS: 10.0000 mg | ORAL_TABLET | Freq: Every day | ORAL | 0 refills | Status: DC

## 2020-12-18 NOTE — Progress Notes (Signed)
[facility] [place], [place] [address]  Internal MEDICINE  Office Visit Note  Patient Name: Kristen Pennington  [id number]  [id number]  Date of Service: 12/22/2020  Chief Complaint  Patient presents with   Follow-up    Med review    HPI Megan presents for a follow-up visit to review her medications.  She was last seen in the office on May 23 for constipation, and a possible muscle strain in her back, and dysuria and possible urinary tract infection.  After that office visit on May 23, she had a visit to the ER on May 26 because the back pain became more severe.  In the ER she was treated for musculoskeletal pain and possible cystitis.  She was given a course of Keflex to treat any urinary tract infection.  In the ER they also did a lumbar spine x-ray that showed posterior fusion hardware, degenerative disc disease of L2-L3 and L5-S1.  On the x-ray no hardware complications were identified and there was diffuse bone demineralization noted.   -She reports that her symptoms of possible UTI have been improving. - She reports that the back pain is improving with the medications she is taking.  At her last office visit she was started on cyclobenzaprine which she reports helps with the spasms and that she is currently taking prescription anti-inflammatory medication for the pain which also helps.   Current Medication: Outpatient Encounter Medications as of 12/18/2020  Medication Sig   acetaminophen (TYLENOL) 500 MG tablet Take 500 mg by mouth every 6 (six) hours as needed.   bisoprolol-hydrochlorothiazide (ZIAC) 5-6.25 MG tablet TAKE 1 TABLET BY MOUTH DAILY   Cholecalciferol (VITAMIN D3) 5000 units TABS Take 5,000 Units by mouth daily.   estradiol (ESTRACE) 0.1 MG/GM vaginal cream Use once a week as needed   fluticasone (FLONASE) 50 MCG/ACT nasal spray 1 SPRAY IN EACH NOSTRIL ONCE DAILY AS NEEDED   hydrOXYzine (ATARAX/VISTARIL) 10 MG tablet Take 1 tablet (10 mg total) by  mouth 3 (three) times daily as needed for anxiety. For severe anxiety attacks only (Patient taking differently: Take 5 mg by mouth 3 (three) times daily as needed for anxiety. For severe anxiety attacks only)   Liniments (SALONPAS PAIN RELIEF PATCH EX) Apply 1 patch topically 2 (two) times daily.   meloxicam (MOBIC) 7.5 MG tablet TAKE 1 TABLET BY MOUTH DAILY   oxybutynin (DITROPAN-XL) 10 MG 24 hr tablet Take 1 tablet (10 mg total) by mouth daily.   pantoprazole (PROTONIX) 40 MG tablet Take 1 tablet (40 mg total) by mouth daily.   Polyethyl Glycol-Propyl Glycol (SYSTANE) 0.4-0.3 % SOLN Apply to eye.   polyethylene glycol powder (GLYCOLAX/MIRALAX) 17 GM/SCOOP powder Take 17 g by mouth daily as needed for mild constipation or moderate constipation.   promethazine (PHENERGAN) 25 MG tablet Take 25 mg by mouth as needed.    rOPINIRole (REQUIP) 1 MG tablet TAKE 1 TABLET BY MOUTH THREE TIMES DAILYAS NEEDED FOR RESTLESS LEGS   simethicone (MYLICON) 80 MG chewable tablet Chew 80 mg by mouth every 6 (six) hours as needed for flatulence.   traZODone (DESYREL) 50 MG tablet TAKE 1/2 TO 1 TABLET BY MOUTH AT BEDTIMEAS DIRECTED, STOP TAKING REMERON (MIRTAZAPINE)   [DISCONTINUED] cyclobenzaprine (FLEXERIL) 10 MG tablet Take 1 tablet (10 mg total) by mouth at bedtime. Take one tab po qhs for back spasm prn only   [DISCONTINUED] escitalopram (LEXAPRO) 20 MG tablet Take 1 tablet (20 mg total) by mouth daily.  Carboxymethylcellulose Sodium (THERATEARS OP) Apply to eye. (Patient not taking: Reported on 12/18/2020)   cyclobenzaprine (FLEXERIL) 10 MG tablet Take 1 tablet (10 mg total) by mouth at bedtime. Take one tab po qhs for back spasm prn only   lubiprostone (AMITIZA) 8 MCG capsule Take 1 capsule po bid prn ibs constipation. (Patient not taking: Reported on 12/18/2020)   Multiple Vitamins-Minerals (CENTRUM SILVER 50+WOMEN) TABS Take by mouth.   XIIDRA 5 % SOLN Apply to eye. Sample given by eye dr (Patient not taking:  Reported on 12/18/2020)   [DISCONTINUED] dicyclomine (BENTYL) 10 MG capsule Take 1 capsule (10 mg total) by mouth 4 (four) times daily -  before meals and at bedtime.   No facility-administered encounter medications on file as of 12/18/2020.    Surgical History: Past Surgical History:  Procedure Laterality Date   COLONOSCOPY  05-03-15   COLONOSCOPY WITH PROPOFOL N/A 05/03/2015   Procedure: COLONOSCOPY WITH PROPOFOL;  Surgeon: Lucilla Lame, MD;  Location: [facility];  Service: Endoscopy;  Laterality: N/A;  CPAP   DILATION AND CURETTAGE OF UTERUS  1970   ENDOMETRIAL BIOPSY     EYE SURGERY Right 2017   POLYPECTOMY  05/03/2015   Procedure: POLYPECTOMY;  Surgeon: Lucilla Lame, MD;  Location: [facility];  Service: Endoscopy;;   ROTATOR CUFF REPAIR  2007   SPINE SURGERY      Medical History: Past Medical History:  Diagnosis Date   Arthritis    Atrophic vaginitis 03/06/2013   Last Assessment & Plan:  She will plan to continue estradiol vaginal cream.  Prescription was rewritten stipulating use of the generic product.    Atypical migraine 03/06/2013   Last Assessment & Plan:  Since she rarely takes sumatriptan, we will discontinue it. Continue sparing use of OTC migraine products.    Avitaminosis D 03/06/2013   Last Assessment & Plan:  Recheck vitamin D level. Plan to continue vitamin D supplementation.    Ceratitis 08/01/2013   Last Assessment & Plan:  Because of the expense, she will discontinue Restasis. She will use over-the-counter moisturizing eyedrops and liquid gel.    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    Cystocele, midline 03/06/2013   Demoralization and apathy 03/06/2013   Last Assessment & Plan:  Since she has been taking  bupropion only once a day, I will write the prescription with the correct instructions and correct quantity. She has done well on this for years and she is encouraged to take it regularly    Depression    Environmental allergies    Epistaxis    Essential (primary) hypertension 05/17/2013   Last Assessment & Plan:  Her blood pressure is well-controlled. We will plan to continue hydrochlorothiazide and benazepril.  Both are generic and should be affordable on her health plan.    GERD (gastroesophageal reflux disease)    Hemorrhoid    Inflammation of sacroiliac joint ([place]) 03/06/2013   Last Assessment & Plan:  She is doing well on her present regimen of fentanyl patch supplemented with sparing use of tramadol/acetaminophen. We will continue that regimen.    Sinusitis    Sleep apnea    CPAP    Family History: Family History  Problem Relation Age of Onset  Arthritis Mother    Alzheimer's disease Father    Heart disease Sister    Cancer Brother    Ovarian cancer Neg Hx    Breast cancer Neg Hx    Colon cancer Neg Hx    Diabetes Neg Hx     Social History   Socioeconomic History   Marital status: Widowed    Spouse name: Not on file   Number of children: 2   Years of education: Not on file   Highest education level: Some college, no degree  Occupational History   Not on file  Tobacco Use   Smoking status: Never   Smokeless tobacco: Never  Vaping Use   Vaping Use: Never used  Substance and Sexual Activity   Alcohol use: No    Alcohol/week: 0.0 standard drinks   Drug use: No   Sexual activity: Not Currently  Other Topics Concern   Not on file  Social History Narrative   Not on file   Social Determinants of Health   Financial Resource Strain: Not on file  Food Insecurity: Not on file  Transportation Needs: Not on file  Physical Activity: Not on file  Stress: Not on file  Social Connections: Not on file  Intimate Partner Violence: Not on file      Review of Systems   Constitutional:  Negative for chills, fatigue and unexpected weight change.  HENT:  Negative for congestion, rhinorrhea, sneezing and sore throat.   Eyes:  Negative for redness.  Respiratory:  Negative for cough, chest tightness and shortness of breath.   Cardiovascular:  Negative for chest pain and palpitations.  Gastrointestinal:  Negative for abdominal pain, constipation, diarrhea, nausea and vomiting.  Genitourinary:  Negative for dysuria and frequency.  Musculoskeletal:  Negative for arthralgias, back pain, joint swelling and neck pain.  Skin:  Negative for rash.  Neurological: Negative.  Negative for tremors and numbness.  Hematological:  Negative for adenopathy. Does not bruise/bleed easily.  Psychiatric/Behavioral:  Negative for behavioral problems (Depression), sleep disturbance and suicidal ideas. The patient is not nervous/anxious.    Vital Signs: BP (!) 142/74   Pulse 60   Temp 98.5 F (36.9 C)   Resp 16   Ht 5\' 2"  (1.575 m)   Wt 142 lb 12.8 oz (64.8 kg)   SpO2 93%   BMI 26.12 kg/m    Physical Exam Vitals reviewed.  Constitutional:      General: She is not in acute distress.    Appearance: She is well-developed. She is not diaphoretic.  HENT:     Head: Normocephalic and atraumatic.     Mouth/Throat:     Pharynx: No oropharyngeal exudate.  Eyes:     Pupils: Pupils are equal, round, and reactive to light.  Neck:     Thyroid: No thyromegaly.     Vascular: No JVD.     Trachea: No tracheal deviation.  Cardiovascular:     Rate and Rhythm: Normal rate and regular rhythm.     Heart sounds: Normal heart sounds. No murmur heard.   No friction rub. No gallop.  Pulmonary:     Effort: Pulmonary effort is normal. No respiratory distress.     Breath sounds: No wheezing or rales.  Chest:     Chest wall: No tenderness.  Abdominal:     General: Bowel sounds are normal.     Palpations: Abdomen is soft.  Musculoskeletal:        General: Normal range of motion.  Cervical back: Normal range of motion and neck supple.  Lymphadenopathy:     Cervical: No cervical adenopathy.  Skin:    General: Skin is warm and dry.  Neurological:     Mental Status: She is alert and oriented to person, place, and time.     Cranial Nerves: No cranial nerve deficit.  Psychiatric:        Behavior: Behavior normal.        Thought Content: Thought content normal.        Judgment: Judgment normal.    Assessment/Plan: 1. Essential hypertension Patient history of hypertension she is currently taking bisoprolol-hydrochlorothiazide 5-6.25 mg tablet once daily.  Blood pressure is controlled with current medications.  Her blood pressure today was 142/74.  2. Irritable bowel syndrome with constipation She has a history of irritable bowel syndrome with constipation she was recently started on MiraLAX powder at her previous office visit and she reports that this medication is as effective so far.  3. MDD (major depressive disorder), recurrent, in partial remission ([place]) The patient has a history of depression.  She denies any depressive symptoms at this time and denies any thoughts of self-harm or suicidal thoughts.  The issues in her life that have been causing her depression were discussed during her visit and the patient has accepted the fact that these issues are not under her control and and she is not able to control the actions of others including her own family members.  She reports that over the last few weeks she is slowly started taking less and less of her antidepressant medication and is interested in discontinuing it altogether.  The patient decided after discussion that she will stop taking her antidepressant medication.  Discussed with the patient keeping the leftover prescription at home and if she finds that she is unable to stay off of her antidepressant medication that she can restart taking it as previously prescribed.  4. Muscle strain of left upper back, initial  encounter The patient had a previous muscle strain in her back at her previous office visits.  Since the office visit she also visited the ER for back pain.  They treated her for the back pain as well as possible urinary tract infection with antibiotic.  Cyclobenzaprine was added to her medication regimen at her previous visit.  She reports that this medication is effective and a refill was ordered. - cyclobenzaprine (FLEXERIL) 10 MG tablet; Take 1 tablet (10 mg total) by mouth at bedtime. Take one tab po qhs for back spasm prn only  Dispense: 30 tablet; Refill: 0  5. Primary osteoarthritis involving multiple joints Patient has a history of osteoarthritis in multiple joints, pain is currently controlled by over-the-counter medications such as Tylenol.   General Counseling: menaal russum understanding of the findings of todays visit and agrees with plan of treatment. I have discussed any further diagnostic evaluation that may be needed or ordered today. We also reviewed her medications today. she has been encouraged to call the office with any questions or concerns that should arise related to todays visit.    No orders of the defined types were placed in this encounter.   Meds ordered this encounter  Medications   cyclobenzaprine (FLEXERIL) 10 MG tablet    Sig: Take 1 tablet (10 mg total) by mouth at bedtime. Take one tab po qhs for back spasm prn only    Dispense:  30 tablet    Refill:  0    Return in about  1 month (around 01/17/2021) for F/U, med refill, Anxiety/depression, Sahiti Joswick PCP.   Total time spent:30 Minutes Time spent includes review of chart, medications, test results, and follow up plan with the patient.   [place] Controlled Substance Database was reviewed by me.  This patient was seen by Jonetta Osgood, FNP-C in collaboration with Dr. Clayborn Bigness as a part of collaborative care agreement.   Alexey Rhoads R. Valetta Fuller, MSN, FNP-C Internal medicine

## 2020-12-22 DIAGNOSIS — S29012A Strain of muscle and tendon of back wall of thorax, initial encounter: Secondary | ICD-10-CM | POA: Insufficient documentation

## 2020-12-26 ENCOUNTER — Other Ambulatory Visit: Payer: Self-pay | Admitting: *Deleted

## 2020-12-26 NOTE — Patient Outreach (Signed)
[facility]) Care Management  12/26/2020  Kristen Pennington August 16, 1928 [id number]   Telephone Assessment-Unsuccessful  RN spoke with pt briefly however pt currently on a provider's visit and requested a call back.  Will follow up at another time as requested. Will scheduled another outreach call over the next week.  [name], RN Care Management Coordinator [place] Office [phone]

## 2021-01-02 ENCOUNTER — Other Ambulatory Visit: Payer: Self-pay | Admitting: *Deleted

## 2021-01-02 ENCOUNTER — Encounter: Payer: Self-pay | Admitting: *Deleted

## 2021-01-02 NOTE — Patient Outreach (Signed)
[facility]) Care Management  01/02/2021  Kristen Pennington 03-27-1929 [id number]  Referral for medication assistance from [name], RN sent to [place].  Ina [facility] Management Assistant [phone]

## 2021-01-02 NOTE — Patient Outreach (Signed)
[facility]) Care Management  01/02/2021  Kristen Pennington 1928-08-14 [id number]   Telephone Assessment-Successful-HTN  Spoke with pt today and discussed [facility] services and the purpose for today's call. Pt receptive to enrollment into the HTN program and services. Discussed all barriers and educated accordingly with the generated plan of care. Pt will understanding of all goals and interventions.   Will send all appropriate letters to pt and provider along with EMMI on HTN and THN calender.Will follow up next month with ongoing management of care related to pt's HTN.    Goals Addressed             This Visit's Progress    Learn More About My Health       Timeframe:  Long-Range Goal Priority:  Medium Start Date:   01/02/2021                          Expected End Date:    04/11/2021                    Follow Up Date 01/31/2021    - make a list of questions - ask questions - repeat what I heard to make sure I understand - bring a list of my medicines to the visit Barriers: Health Behaviors     Why is this important?   The best way to learn about your health and care is by talking to the doctor and nurse.  They will answer your questions and give you information in the way that you like best.    Notes:  6/23: Discussed all the above and strongly encouraged pt to adherence to the plan for further interventions via her provider. On assisting with her medications and THN pending management of care related to her medication management.      Manage My Medicine       Timeframe:  Long-Range Goal Priority:  Medium Start Date:   01/02/2021                        Expected End Date:   04/11/2021                    Follow Up Date 01/31/2021    - call for medicine refill 2 or 3 days before it runs out - call if I am sick and can't take my medicine - keep a list of all the medicines I take; vitamins and herbals too - use a pillbox to sort medicine Barriers: Health  Behaviors Knowledge     Why is this important?   These steps will help you keep on track with your medicines.   Notes:  6/23: Pt mentioned several issues related to her medication with adherence, education and medication management. Pt states she is pending an appointment to her provider to "go over her medications". RN also referred to [place] for medication assistance and medication management due to several medication not take. RN educated pt on all medications and the purpose/importance on taking all medication. Informed pt if she is not taking her medications her symptoms maybe exacerbated or she may have related issues. Strongly encourage adherence and to report all changes with her provider.       [place] My Quality of Life       Timeframe:  Long-Range Goal Priority:  Medium Start Date:   01/02/2021  Expected End Date: 04/11/2021                      Follow Up Date: 01/31/2021    - discuss my treatment options with the doctor or nurse - do one enjoyable thing every day - learn something new by asking, reading and searching the Internet every day - make shared treatment decisions with doctor - strengthen or fix relationships with loved ones  Barriers: Health Behaviors   Patient's preferred language:   Why is this important?  Having a long-term illness can be scary.  It can also be stressful for you and your caregiver.  These steps may help.    Notes:  6/23: Discuss and review all pt's barriers and issues. Strongly encourage pt to document all her medical issues and discuss with her provider on her upcoming appointment. Also discussed issues concerning her relationship with her children and encouraged pt to have a discussion with both concerning what has expectations are with both involved with her care.       Track and Manage My Blood Pressure-Hypertension       Timeframe:  Short-Term Goal Priority:  Medium Start Date:  01/02/2021                         Expected End Date:  02/07/2021                  Follow Up Date: 01/31/2021    - check blood pressure 3 times per week - choose a place to take my blood pressure (home, clinic or office, retail store) - write blood pressure results in a log or diary   Barriers: Health Behaviors Knowledge  Why is this important?   You won't feel high blood pressure, but it can still hurt your blood vessels.  High blood pressure can cause heart or kidney problems. It can also cause a stroke.  Making lifestyle changes like losing a little weight or eating less salt will help.  Checking your blood pressure at home and at different times of the day can help to control blood pressure.  If the doctor prescribes medicine remember to take it the way the doctor ordered.  Call the office if you cannot afford the medicine or if there are questions about it.     Notes:  6/23: Pt states she will agree to take her blood pressure 3 X a week. Encouraged pt to document in th [place] for her providers to view. Pt aware of possible sign/ symptoms and what to do acute with her blood pressure. Pt current at a senior living facility has has nearby neighbors for support along with a daughter and son however out of town.           [name], RN Care Management Coordinator [place] Office [phone]

## 2021-01-03 ENCOUNTER — Telehealth: Payer: Self-pay

## 2021-01-03 NOTE — Telephone Encounter (Signed)
received message on [email or web address] that the prior auth was approved.

## 2021-01-03 NOTE — Telephone Encounter (Signed)
received a email that a prior auth was needed for the hydroxyzine

## 2021-01-03 NOTE — Telephone Encounter (Signed)
submited the prior auth - pending

## 2021-01-06 ENCOUNTER — Ambulatory Visit (INDEPENDENT_AMBULATORY_CARE_PROVIDER_SITE_OTHER): Payer: Medicare HMO | Admitting: Physician Assistant

## 2021-01-06 ENCOUNTER — Telehealth: Payer: Self-pay

## 2021-01-06 ENCOUNTER — Encounter: Payer: Self-pay | Admitting: Physician Assistant

## 2021-01-06 ENCOUNTER — Other Ambulatory Visit: Payer: Self-pay

## 2021-01-06 VITALS — BP 174/82 | HR 62 | Temp 98.1°F | Resp 16 | Ht 64.0 in | Wt 136.2 lb

## 2021-01-06 DIAGNOSIS — F411 Generalized anxiety disorder: Secondary | ICD-10-CM | POA: Diagnosis not present

## 2021-01-06 DIAGNOSIS — F3341 Major depressive disorder, recurrent, in partial remission: Secondary | ICD-10-CM | POA: Diagnosis not present

## 2021-01-06 DIAGNOSIS — I10 Essential (primary) hypertension: Secondary | ICD-10-CM | POA: Diagnosis not present

## 2021-01-06 NOTE — Telephone Encounter (Signed)
Pt daughter called [phone] that pt need help with home health nursing,home health aide  I advised her that we will order on her appt  today
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
## 2021-01-15 ENCOUNTER — Ambulatory Visit: Payer: Medicare HMO | Admitting: Nurse Practitioner

## 2021-01-21 ENCOUNTER — Ambulatory Visit: Payer: Medicare HMO | Admitting: Internal Medicine

## 2021-01-27 ENCOUNTER — Other Ambulatory Visit: Payer: Self-pay | Admitting: Internal Medicine

## 2021-01-27 ENCOUNTER — Telehealth: Payer: Self-pay

## 2021-01-27 MED ORDER — TRAMADOL HCL 50 MG PO TABS: ORAL_TABLET | ORAL | 0 refills | Status: DC

## 2021-01-27 NOTE — Telephone Encounter (Signed)
Tramadol is sent

## 2021-01-28 ENCOUNTER — Telehealth: Payer: Self-pay

## 2021-01-28 NOTE — Telephone Encounter (Signed)
Called LMOM that we sent tramadol to her pharmacy.

## 2021-01-31 ENCOUNTER — Other Ambulatory Visit: Payer: Self-pay | Admitting: *Deleted

## 2021-01-31 NOTE — Patient Outreach (Signed)
[facility]) Care Management  01/31/2021  Kristen Pennington 06/06/1929 [id number]   Telephone Assessment-Successful-HTN  RN spoke with pt today who provided an update on her ongoing management of care. Discussed with review plan of care along with all goals and interventions. Noted updates with the plan of care with BP readings. No acute issues to address  at this time.  Will follow up next month and continue to communicate with pt's provider.    Goals Addressed             This Visit's Progress    THN-Manage My Medicine   On track    Follow Up Date 02/28/2021  Timeframe:  Long-Range Goal Priority:  Medium Start Date:   01/02/2021                        Expected End Date:   04/11/2021                       - call for medicine refill 2 or 3 days before it runs out - call if I am sick and can't take my medicine - keep a list of all the medicines I take; vitamins and herbals too - use a pillbox to sort medicine Barriers: Health Behaviors Knowledge     Why is this important?   These steps will help you keep on track with your medicines.   Notes:  6/23: Pt mentioned several issues related to her medication with adherence, education and medication management. Pt states she is pending an appointment to her provider to "go over her medications". RN also referred to [facility] for medication assistance and medication management due to several medication not take. RN educated pt on all medications and the purpose/importance on taking all medication. Informed pt if she is not taking her medications her symptoms maybe exacerbated or she may have related issues. Strongly encourage adherence and to report all changes with her provider.      THN-Matintain My Quality of Life   On track    Follow Up Date: 02/28/2021  Timeframe:  Long-Range Goal Priority:  Medium Start Date:   01/02/2021                          Expected End Date: 04/11/2021                        -  discuss my treatment options with the doctor or nurse - do one enjoyable thing every day - learn something new by asking, reading and searching the Internet every day - make shared treatment decisions with doctor - strengthen or fix relationships with loved ones  Barriers: Health Behaviors   Patient's preferred language:   Why is this important?  Having a long-term illness can be scary.  It can also be stressful for you and your caregiver.  These steps may help.    Notes:  6/23: Discuss and review all pt's barriers and issues. Strongly encourage pt to document all her medical issues and discuss with her provider on her upcoming appointment. Also discussed issues concerning her relationship with her children and encouraged pt to have a discussion with both concerning what has expectations are with both involved with her care.      THN-Track and Manage My Blood Pressure-Hypertension   On track    Follow Up Date: 02/28/2021  Timeframe:  Short-Term Goal Priority:  Medium Start Date:  01/02/2021                        Expected End Date:  03/12/2021                     - check blood pressure 3 times per week - choose a place to take my blood pressure (home, clinic or office, retail store) - write blood pressure results in a log or diary   Barriers: Health Behaviors Knowledge  Why is this important?   You won't feel high blood pressure, but it can still hurt your blood vessels.  High blood pressure can cause heart or kidney problems. It can also cause a stroke.  Making lifestyle changes like losing a little weight or eating less salt will help.  Checking your blood pressure at home and at different times of the day can help to control blood pressure.  If the doctor prescribes medicine remember to take it the way the doctor ordered.  Call the office if you cannot afford the medicine or if there are questions about it.     Notes:  7/22-Pt continues to do well with no acute issues. Pt  continues to have supportive neighbors and a daughter in [place]. Reports [id number] with no acute issues. Pt states the facility has isolated some of the residents due to restrictions at the ALF [facility]). No acute issues reported over the last month. 6/23: Pt states she will agree to take her blood pressure 3 X a week. Encouraged pt to document in th [place] for her providers to view. Pt aware of possible sign/ symptoms and what to do acute with her blood pressure. Pt current at a senior living facility has has nearby neighbors for support along with a daughter and son however out of town.          [name], RN Care Management Coordinator [place] Office [phone]

## 2021-02-04 ENCOUNTER — Telehealth (INDEPENDENT_AMBULATORY_CARE_PROVIDER_SITE_OTHER): Payer: Medicare HMO | Admitting: Psychiatry

## 2021-02-04 ENCOUNTER — Other Ambulatory Visit: Payer: Self-pay

## 2021-02-04 ENCOUNTER — Encounter: Payer: Self-pay | Admitting: Psychiatry

## 2021-02-04 DIAGNOSIS — F330 Major depressive disorder, recurrent, mild: Secondary | ICD-10-CM

## 2021-02-04 DIAGNOSIS — F411 Generalized anxiety disorder: Secondary | ICD-10-CM

## 2021-02-04 DIAGNOSIS — R413 Other amnesia: Secondary | ICD-10-CM

## 2021-02-04 DIAGNOSIS — G4701 Insomnia due to medical condition: Secondary | ICD-10-CM

## 2021-02-04 NOTE — Progress Notes (Signed)
Virtual Visit via Telephone Note  I connected with Kristen Pennington on 02/04/21 at 11:00 AM EDT by telephone and verified that I am speaking with the correct person using two identifiers.  Location Provider Location : [facility] Patient Location : Home  Participants: Patient , Provider    I discussed the limitations, risks, security and privacy concerns of performing an evaluation and management service by telephone and the availability of in person appointments. I also discussed with the patient that there may be a patient responsible charge related to this service. The patient expressed understanding and agreed to proceed.     I discussed the assessment and treatment plan with the patient. The patient was provided an opportunity to ask questions and all were answered. The patient agreed with the plan and demonstrated an understanding of the instructions.   The patient was advised to call back or seek an in-person evaluation if the symptoms worsen or if the condition fails to improve as anticipated.   [place] MD OP Progress Note  02/04/2021 11:30 AM Kristen Pennington  MRN:  [id number]  Chief Complaint:  Chief Complaint   Follow-up; Anxiety    HPI: Kristen Pennington is a 87 year old Caucasian female, widowed, lives in [place], has a history of MDD, GAD, delusional disorder, insomnia, hearing loss, vision loss, OSA, hypertension was evaluated by telemedicine today.  Patient was advised to come into the office for an in person visit last session.  Since patient did not show up for the appointment today writer contacted patient by phone and changed the visit into a phone call.  Patient reported that she had gotten dates mixed up.  She apologized.  Patient reports overall she has been doing okay with regards to her mood.  She stopped taking the Lexapro.  She reports she has been using the Lexapro only as needed and she has been doing fine without it.  She reports she continues to take the  trazodone as needed for sleep which helps.  She denies any suicidality.  She did not seem to be preoccupied with any delusions.  Patient reports she continues to struggle with multiple medical problems including pain.  She is currently on medications for the same including a muscle relaxant which helps.  Reviewed notes per [facility] -RN [name] -dated 01/31/2021-patient continues to do well with no acute issues.  She has supportive neighbors and a daughter in [place].  Visit Diagnosis:    ICD-10-CM   1. Generalized anxiety disorder  F41.1     2. MDD (major depressive disorder), recurrent episode, mild (HCC)  F33.0    improving    3. Insomnia due to medical condition  G47.01    pain, mood    4. Memory problem  R41.3       Past Psychiatric History: I have reviewed past psychiatric history from progress note on 08/11/2018.  Past trials of Klonopin, Abilify, Lexapro, mirtazapine  Past Medical History:  Past Medical History:  Diagnosis Date   Arthritis    Atrophic vaginitis 03/06/2013   Last Assessment & Plan:  She will plan to continue estradiol vaginal cream.  Prescription was rewritten stipulating use of the generic product.    Atypical migraine 03/06/2013   Last Assessment & Plan:  Since she rarely takes sumatriptan, we will discontinue it. Continue sparing use of OTC migraine products.    Avitaminosis D 03/06/2013   Last Assessment & Plan:  Recheck vitamin D level. Plan to continue vitamin D supplementation.  Ceratitis 08/01/2013   Last Assessment & Plan:  Because of the expense, she will discontinue Restasis. She will use over-the-counter moisturizing eyedrops and liquid gel.    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    Cystocele, midline  03/06/2013   Demoralization and apathy 03/06/2013   Last Assessment & Plan:  Since she has been taking bupropion only once a day, I will write the prescription with the correct instructions and correct quantity. She has done well on this for years and she is encouraged to take it regularly    Depression    Environmental allergies    Epistaxis    Essential (primary) hypertension 05/17/2013   Last Assessment & Plan:  Her blood pressure is well-controlled. We will plan to continue hydrochlorothiazide and benazepril.  Both are generic and should be affordable on her health plan.    GERD (gastroesophageal reflux disease)    Hemorrhoid    Inflammation of sacroiliac joint ([place]) 03/06/2013   Last Assessment & Plan:  She is doing well on her present regimen of fentanyl patch supplemented with sparing use of tramadol/acetaminophen. We will continue that regimen.    Sinusitis    Sleep apnea    CPAP    Past Surgical History:  Procedure Laterality Date   COLONOSCOPY  05-03-15   COLONOSCOPY WITH PROPOFOL N/A 05/03/2015   Procedure: COLONOSCOPY WITH PROPOFOL;  Surgeon: Lucilla Lame, MD;  Location: [facility];  Service: Endoscopy;  Laterality: N/A;  CPAP   DILATION AND CURETTAGE OF UTERUS  1970   ENDOMETRIAL BIOPSY     EYE SURGERY Right 2017   POLYPECTOMY  05/03/2015   Procedure: POLYPECTOMY;  Surgeon: Lucilla Lame, MD;  Location: [place];  Service: Endoscopy;;   ROTATOR CUFF REPAIR  2007   SPINE SURGERY      Family Psychiatric History: Reviewed family psychiatric history from progress note on 08/11/2018  Family History:  Family History  Problem Relation Age of Onset   Arthritis Mother    Alzheimer's disease Father    Heart disease Sister    Cancer Brother    Ovarian cancer Neg Hx    Breast cancer Neg Hx    Colon cancer Neg Hx    Diabetes Neg Hx     Social History: Reviewed social history from progress note from 08/11/2018 Social History   Socioeconomic History   Marital  status: Widowed    Spouse name: Not on file   Number of children: 2   Years of education: Not on file   Highest education level: Some college, no degree  Occupational History   Not on file  Tobacco Use   Smoking status: Never   Smokeless tobacco: Never  Vaping Use   Vaping Use: Never used  Substance and Sexual Activity   Alcohol use: No    Alcohol/week: 0.0 standard drinks   Drug use: No   Sexual activity: Not Currently  Other Topics Concern   Not on file  Social History Narrative   Not on file   Social Determinants of Health   Financial Resource Strain: Not on file  Food Insecurity: No Food Insecurity   Worried About [place] in the Last Year: Never true   Ran Out of  Food in the Last Year: Never true  Transportation Needs: No Transportation Needs   Lack of Transportation (Medical): No   Lack of Transportation (Non-Medical): No  Physical Activity: Not on file  Stress: Not on file  Social Connections: Not on file    Allergies: No Known Allergies  Metabolic Disorder Labs: Lab Results  Component Value Date   HGBA1C 5.4 08/01/2018   Lab Results  Component Value Date   PROLACTIN 8.1 08/01/2018   Lab Results  Component Value Date   CHOL 191 11/07/2020   TRIG 98 11/07/2020   HDL 54 11/07/2020   CHOLHDL 3.5 11/07/2020   VLDL 20 11/07/2020   LDLCALC 117 (H) 11/07/2020   LDLCALC 159 (H) 08/01/2018   Lab Results  Component Value Date   TSH 2.522 11/07/2020   TSH 2.830 08/10/2018    Therapeutic Level Labs: No results found for: LITHIUM No results found for: VALPROATE No components found for:  CBMZ  Current Medications: Current Outpatient Medications  Medication Sig Dispense Refill   acetaminophen (TYLENOL) 500 MG tablet Take 500 mg by mouth every 6 (six) hours as needed.     bisoprolol-hydrochlorothiazide (ZIAC) 5-6.25 MG tablet TAKE 1 TABLET BY MOUTH DAILY 30 tablet 3   Cholecalciferol (VITAMIN D3) 5000 units TABS Take 5,000 Units by mouth  daily.     cyclobenzaprine (FLEXERIL) 10 MG tablet Take 1 tablet (10 mg total) by mouth at bedtime. Take one tab po qhs for back spasm prn only 30 tablet 0   estradiol (ESTRACE) 0.1 MG/GM vaginal cream Use once a week as needed 42.5 g 1   fluticasone (FLONASE) 50 MCG/ACT nasal spray 1 SPRAY IN EACH NOSTRIL ONCE DAILY AS NEEDED 16 g 1   Liniments (SALONPAS PAIN RELIEF PATCH EX) Apply 1 patch topically 2 (two) times daily.     lubiprostone (AMITIZA) 8 MCG capsule Take 1 capsule po bid prn ibs constipation. 60 capsule 2   meloxicam (MOBIC) 7.5 MG tablet TAKE 1 TABLET BY MOUTH DAILY 30 tablet 2   Multiple Vitamins-Minerals (CENTRUM SILVER 50+WOMEN) TABS Take by mouth.     oxybutynin (DITROPAN-XL) 10 MG 24 hr tablet Take 1 tablet (10 mg total) by mouth daily. (Patient not taking: No sig reported) 30 tablet 2   Polyethyl Glycol-Propyl Glycol (SYSTANE) 0.4-0.3 % SOLN Apply to eye.     polyethylene glycol powder (GLYCOLAX/MIRALAX) 17 GM/SCOOP powder Take 17 g by mouth daily as needed for mild constipation or moderate constipation. 3350 g 1   promethazine (PHENERGAN) 25 MG tablet Take 25 mg by mouth as needed.      rOPINIRole (REQUIP) 1 MG tablet TAKE 1 TABLET BY MOUTH THREE TIMES DAILYAS NEEDED FOR RESTLESS LEGS 90 tablet 3   simethicone (MYLICON) 80 MG chewable tablet Chew 80 mg by mouth every 6 (six) hours as needed for flatulence.     traMADol (ULTRAM) 50 MG tablet Take one tab po bid for severe pain prn 15 tablet 0   traZODone (DESYREL) 50 MG tablet TAKE 1/2 TO 1 TABLET BY MOUTH AT BEDTIMEAS DIRECTED, STOP TAKING REMERON (MIRTAZAPINE) 90 tablet 0   XIIDRA 5 % SOLN Apply to eye. Sample given by eye dr (Patient not taking: No sig reported)     No current facility-administered medications for this visit.     Musculoskeletal: Strength & Muscle Tone:  UTA Gait & Station: UTA Patient leans:  UTA  Psychiatric Specialty Exam: Review of Systems  Constitutional:  Positive for fatigue.   Gastrointestinal:  Positive  for nausea (improved).  Musculoskeletal:  Positive for arthralgias and myalgias.       LE ankle swelling  Psychiatric/Behavioral:  Positive for sleep disturbance. The patient is nervous/anxious.   All other systems reviewed and are negative.  There were no vitals taken for this visit.There is no height or weight on file to calculate BMI.  General Appearance:  UTA  Eye Contact:  UTA  Speech:  Clear and Coherent  Volume:  Normal  Mood:  Anxious  Affect:   UTA  Thought Process:  Goal Directed and Descriptions of Associations: Circumstantial  Orientation:  Full (Time, Place, and Person)  Thought Content: Logical   Suicidal Thoughts:  No  Homicidal Thoughts:  No  Memory:  Immediate;   Fair Recent;   Fair Remote;   Limited  Judgement:  Fair  Insight:  Fair  Psychomotor Activity:   UTA  Concentration:  Concentration: Fair and Attention Span: Fair  Recall:  [employer] of Knowledge: Fair  Language: Fair  Akathisia:  No  Handed:  Right  AIMS (if indicated): not done  Assets:  Chief Executive Officer Social Support  ADL's:  Intact  Cognition: Limited  Sleep:   restless due to pain   Screenings: Mini-Mental    Flowsheet Row Clinical Support from 03/07/2020 in [facility], [place] from 03/06/2019 in [facility], [place] from 03/02/2018 in [facility], [facility]  Total Score (max 30 points ) '30 30 27      '$ PHQ2-9    [place] Visit from 01/06/2021 in [facility], [facility] Patient Outreach Telephone from 01/02/2021 in [place] Visit from 12/02/2020 in [place], [place] from 10/07/2020 in [facility], [facility] Video Visit from 08/29/2020 in [place]  PHQ-2 Total Score 0 0 0 2 1      [place] ED from 12/05/2020 in [place] [facility] from 11/04/2020 in [place] Video Visit from 08/29/2020 in [place] No Risk No Risk No Risk        Assessment and Plan: Kristen Pennington is a 87 year old female who has a history of GAD, MDD, history of psychosis, essential hypertension, chronic pain, OSA, hearing loss, bereavement was evaluated by telemedicine today.  Patient is currently improving with regards to her mood, however has been noncompliant with Lexapro.  Discussed plan as noted below.  Plan GAD-improving Discontinue Lexapro for noncompliance. Will monitor closely  Insomnia-likely due to pain-improving Trazodone 25-50 mg p.o. nightly as needed Continue tramadol, Flexeril.  Memory problems-unstable Patient was referred for neurology-pending TSH-within normal limits dated 11/07/2020 Vitamin B12-371-within normal limits.  She will benefit from vitamin B12 replacement-goal more than 500. We will coordinate care with primary care provider.  Patient also to follow up with her provider for her lower extremity swelling.  Discussed with patient noncompliance policy.  Discussed with patient to come into the office for in person visit next visit.  She agreed with plan.  I have reviewed notes per RN-Ms. [facility]-chronic care management.  As noted above.  Follow-up in clinic in 1 month in person.   I have spent at least 20 minutes non face to face with patient today.  This note was generated in part or whole with voice recognition software. Voice recognition is usually quite accurate but there are transcription errors that can and  very often do occur. I apologize for any typographical errors that were not detected and corrected.         Ursula Alert, MD 02/04/2021, 11:30 AM

## 2021-02-05 ENCOUNTER — Other Ambulatory Visit: Payer: Self-pay | Admitting: Psychiatry

## 2021-02-05 ENCOUNTER — Other Ambulatory Visit: Payer: Self-pay | Admitting: Nurse Practitioner

## 2021-02-05 ENCOUNTER — Other Ambulatory Visit: Payer: Self-pay | Admitting: Internal Medicine

## 2021-02-05 DIAGNOSIS — F411 Generalized anxiety disorder: Secondary | ICD-10-CM

## 2021-02-05 DIAGNOSIS — S29012A Strain of muscle and tendon of back wall of thorax, initial encounter: Secondary | ICD-10-CM

## 2021-02-10 ENCOUNTER — Encounter: Payer: Self-pay | Admitting: Physician Assistant

## 2021-02-10 ENCOUNTER — Other Ambulatory Visit: Payer: Self-pay

## 2021-02-10 ENCOUNTER — Ambulatory Visit (INDEPENDENT_AMBULATORY_CARE_PROVIDER_SITE_OTHER): Payer: Medicare HMO | Admitting: Physician Assistant

## 2021-02-10 DIAGNOSIS — M545 Low back pain, unspecified: Secondary | ICD-10-CM

## 2021-02-10 DIAGNOSIS — R600 Localized edema: Secondary | ICD-10-CM

## 2021-02-10 DIAGNOSIS — I10 Essential (primary) hypertension: Secondary | ICD-10-CM | POA: Diagnosis not present

## 2021-02-10 DIAGNOSIS — K5900 Constipation, unspecified: Secondary | ICD-10-CM | POA: Diagnosis not present

## 2021-02-10 DIAGNOSIS — Z23 Encounter for immunization: Secondary | ICD-10-CM

## 2021-02-10 DIAGNOSIS — R110 Nausea: Secondary | ICD-10-CM

## 2021-02-10 DIAGNOSIS — G8929 Other chronic pain: Secondary | ICD-10-CM

## 2021-02-10 MED ORDER — ONDANSETRON HCL 4 MG PO TABS: 4.0000 mg | ORAL_TABLET | Freq: Three times a day (TID) | ORAL | 0 refills | Status: DC | PRN

## 2021-02-10 MED ORDER — TRAMADOL HCL 50 MG PO TABS: ORAL_TABLET | ORAL | 0 refills | Status: DC

## 2021-02-10 MED ORDER — FUROSEMIDE 20 MG PO TABS: 20.0000 mg | ORAL_TABLET | Freq: Every day | ORAL | 3 refills | Status: DC

## 2021-02-10 MED ORDER — ZOSTER VAC RECOMB ADJUVANTED 50 MCG/0.5ML IM SUSR: 0.5000 mL | Freq: Once | INTRAMUSCULAR | 0 refills | Status: AC

## 2021-02-10 NOTE — Progress Notes (Signed)
[facility] [place], [place] [address]  Internal MEDICINE  Office Visit Note  Patient Name: Kristen Pennington  [id number]  [id number]  Date of Service: 02/11/2021  Chief Complaint  Patient presents with   Acute Visit    Swelling on left foot going up leg, swelling in both feet but left is worse, gets worse by end of day, refills     HPI Pt is here for a sick visit. -Pt states swelling in legs started about 3 weeks ago. Thinks it was left foot first but both now swollen. Goes down with elevation. Denies any SOB.  -Back pain has been more severe lately. Hx of this back in 2005-06 when she had back surgery, but has started up again and requests a refill of tramadol to help with this. She did have a fall back in April and possible muscle strain in may for which muscle relaxers did not help. -Gets nauseous at times and constipated more recently--using supplement to help, polyethylene glycol. Discussed this could be due to tramadol as these are often S/E of pain medications and states she will continue to increase fiber to help with this. -Followed by psych for anxiety and depression  Current Medication:  Outpatient Encounter Medications as of 02/10/2021  Medication Sig   acetaminophen (TYLENOL) 500 MG tablet Take 500 mg by mouth every 6 (six) hours as needed.   bisoprolol-hydrochlorothiazide (ZIAC) 5-6.25 MG tablet TAKE 1 TABLET BY MOUTH DAILY   Cholecalciferol (VITAMIN D3) 5000 units TABS Take 5,000 Units by mouth daily.   cyclobenzaprine (FLEXERIL) 10 MG tablet TAKE 1 TABLET BY MOUTH AT BEDTIME FOR BACK SPASMS AS NEEDED ONLY.   estradiol (ESTRACE) 0.1 MG/GM vaginal cream Use once a week as needed   fluticasone (FLONASE) 50 MCG/ACT nasal spray 1 SPRAY IN EACH NOSTRIL ONCE DAILY AS NEEDED   furosemide (LASIX) 20 MG tablet Take 1 tablet (20 mg total) by mouth daily.   Liniments (SALONPAS PAIN RELIEF PATCH EX) Apply 1 patch topically 2 (two) times daily.    lubiprostone (AMITIZA) 8 MCG capsule Take 1 capsule po bid prn ibs constipation.   meloxicam (MOBIC) 7.5 MG tablet TAKE 1 TABLET BY MOUTH DAILY   Multiple Vitamins-Minerals (CENTRUM SILVER 50+WOMEN) TABS Take by mouth.   ondansetron (ZOFRAN) 4 MG tablet Take 1 tablet (4 mg total) by mouth every 8 (eight) hours as needed for nausea or vomiting.   oxybutynin (DITROPAN-XL) 10 MG 24 hr tablet Take 1 tablet (10 mg total) by mouth daily.   Polyethyl Glycol-Propyl Glycol (SYSTANE) 0.4-0.3 % SOLN Apply to eye.   polyethylene glycol powder (GLYCOLAX/MIRALAX) 17 GM/SCOOP powder Take 17 g by mouth daily as needed for mild constipation or moderate constipation.   promethazine (PHENERGAN) 25 MG tablet Take 25 mg by mouth as needed.    rOPINIRole (REQUIP) 1 MG tablet TAKE 1 TABLET BY MOUTH THREE TIMES DAILYAS NEEDED FOR RESTLESS LEGS   simethicone (MYLICON) 80 MG chewable tablet Chew 80 mg by mouth every 6 (six) hours as needed for flatulence.   traZODone (DESYREL) 50 MG tablet TAKE 1/2 TO 1 TABLET BY MOUTH AT BEDTIMEAS DIRECTED, STOP TAKING REMERON (MIRTAZAPINE)   [DISCONTINUED] traMADol (ULTRAM) 50 MG tablet Take one tab po bid for severe pain prn   [DISCONTINUED] Zoster Vaccine Adjuvanted [facility]) injection Inject 0.5 mLs into the muscle once.   traMADol (ULTRAM) 50 MG tablet Take one tab po bid for severe pain prn   [EXPIRED] Zoster Vaccine Adjuvanted [facility]) injection  Inject 0.5 mLs into the muscle once for 1 dose.   [DISCONTINUED] XIIDRA 5 % SOLN Apply to eye. Sample given by eye dr (Patient not taking: No sig reported)   No facility-administered encounter medications on file as of 02/10/2021.      Medical History: Past Medical History:  Diagnosis Date   Arthritis    Atrophic vaginitis 03/06/2013   Last Assessment & Plan:  She will plan to continue estradiol vaginal cream.  Prescription was rewritten stipulating use of the generic product.    Atypical migraine 03/06/2013   Last Assessment &  Plan:  Since she rarely takes sumatriptan, we will discontinue it. Continue sparing use of OTC migraine products.    Avitaminosis D 03/06/2013   Last Assessment & Plan:  Recheck vitamin D level. Plan to continue vitamin D supplementation.    Ceratitis 08/01/2013   Last Assessment & Plan:  Because of the expense, she will discontinue Restasis. She will use over-the-counter moisturizing eyedrops and liquid gel.    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    Cystocele, midline 03/06/2013   Demoralization and apathy 03/06/2013   Last Assessment & Plan:  Since she has been taking bupropion only once a day, I will write the prescription with the correct instructions and correct quantity. She has done well on this for years and she is encouraged to take it regularly    Depression    Environmental allergies    Epistaxis    Essential (primary) hypertension 05/17/2013   Last Assessment & Plan:  Her blood pressure is well-controlled. We will plan to continue hydrochlorothiazide and benazepril.  Both are generic and should be affordable on her health plan.    GERD (gastroesophageal reflux disease)    Hemorrhoid    Inflammation of sacroiliac joint ([place]) 03/06/2013   Last Assessment & Plan:  She is doing well on her present regimen of fentanyl patch supplemented with sparing use of tramadol/acetaminophen. We will continue that regimen.    Sinusitis    Sleep apnea    CPAP     Vital Signs: BP (!) 152/84   Pulse 77   Temp (!) 97.4 F (36.3 C)   Resp 16   Ht '5\' 4"'$  (1.626 m)   Wt 136 lb 6.4 oz (61.9 kg)   SpO2 99%   BMI 23.41 kg/m    Review of Systems  Constitutional:  Negative for fatigue and fever.  HENT:  Negative for congestion, mouth sores and postnasal  drip.   Respiratory:  Negative for cough and shortness of breath.   Cardiovascular:  Positive for leg swelling. Negative for chest pain.  Gastrointestinal:  Positive for constipation and nausea.  Genitourinary:  Negative for flank pain.  Musculoskeletal:  Positive for back pain.  Psychiatric/Behavioral:  Positive for behavioral problems. The patient is nervous/anxious.    Physical Exam Vitals and nursing note reviewed.  Constitutional:      General: She is not in acute distress.    Appearance: She is well-developed and normal weight. She is not diaphoretic.  HENT:     Head: Normocephalic and atraumatic.     Mouth/Throat:     Pharynx: No oropharyngeal exudate.  Eyes:     Pupils: Pupils are equal, round, and  reactive to light.  Neck:     Thyroid: No thyromegaly.     Vascular: No JVD.     Trachea: No tracheal deviation.  Cardiovascular:     Rate and Rhythm: Normal rate and regular rhythm.     Pulses: Normal pulses.     Heart sounds: Normal heart sounds. No murmur heard.   No friction rub. No gallop.  Pulmonary:     Effort: Pulmonary effort is normal. No respiratory distress.     Breath sounds: No wheezing or rales.  Chest:     Chest wall: No tenderness.  Abdominal:     General: Bowel sounds are normal.     Palpations: Abdomen is soft.  Musculoskeletal:        General: Normal range of motion.     Cervical back: Normal range of motion and neck supple.     Right lower leg: Edema present.     Left lower leg: Edema present.  Lymphadenopathy:     Cervical: No cervical adenopathy.  Skin:    General: Skin is warm and dry.  Neurological:     Mental Status: She is alert and oriented to person, place, and time.     Cranial Nerves: No cranial nerve deficit.  Psychiatric:        Behavior: Behavior normal.        Thought Content: Thought content normal.        Judgment: Judgment normal.      Assessment/Plan: 1. Lower extremity edema Will start on lasix x3 days, if improvement  may use prn. If swelling does not improve with single tab may take BID for 3 days. Will have her go for labwork in 1 week. If swelling persists or any SOB develops may need to consider echo. Educated to elevate legs. - furosemide (LASIX) 20 MG tablet; Take 1 tablet (20 mg total) by mouth daily.  Dispense: 30 tablet; Refill: 3 - Comprehensive metabolic panel  2. Chronic bilateral low back pain without sciatica May continue tramadol BID as needed for severe pain. - traMADol (ULTRAM) 50 MG tablet; Take one tab po bid for severe pain prn  Dispense: 60 tablet; Refill: 0 [place] Controlled Substance Database was reviewed by me for overdose risk score (ORS) Reviewed risks and possible side effects associated with taking opiates, benzodiazepines and other CNS depressants. Combination of these could cause dizziness and drowsiness. Advised patient not to drive or operate machinery when taking these medications, as patient's and other's life can be at risk and will have consequences. Patient verbalized understanding in this matter. Dependence and abuse for these drugs will be monitored closely. A Controlled substance policy and procedure is on file which allows [facility] to order a urine drug screen test at any visit. Patient understands and agrees with the plan  3. Essential hypertension Elevated in office, but pt is in pain. Will add lasix due to swelling which may also help BP regulation. Will monitor closely.  4. Constipation, unspecified constipation type Continue miralax as needed and increase fiber  5. Nausea May take zofran as needed - ondansetron (ZOFRAN) 4 MG tablet; Take 1 tablet (4 mg total) by mouth every 8 (eight) hours as needed for nausea or vomiting.  Dispense: 20 tablet; Refill: 0  6. Need for shingles vaccine - Zoster Vaccine Adjuvanted [facility]) injection; Inject 0.5 mLs into the muscle once for 1 dose.  Dispense: 0.5 mL; Refill: 0   General Counseling: Kristen Pennington  understanding of the findings of todays  visit and agrees with plan of treatment. I have discussed any further diagnostic evaluation that may be needed or ordered today. We also reviewed her medications today. she has been encouraged to call the office with any questions or concerns that should arise related to todays visit.    Counseling:  Hypertension Counseling:   The following hypertensive lifestyle modification were recommended and discussed:  1. Limiting alcohol intake to less than 1 oz/day of ethanol:(24 oz of beer or 8 oz of wine or 2 oz of 100-proof whiskey). 2. Take baby ASA 81 mg daily. 3. Importance of regular aerobic exercise and losing weight. 4. Reduce dietary saturated fat and cholesterol intake for overall cardiovascular health. 5. Maintaining adequate dietary potassium, calcium, and magnesium intake. 6. Regular monitoring of the blood pressure. 7. Reduce sodium intake to less than 100 mmol/day (less than 2.3 gm of sodium or less than 6 gm of sodium choride)     Orders Placed This Encounter  Procedures   Comprehensive metabolic panel    Meds ordered this encounter  Medications   Zoster Vaccine Adjuvanted [facility]) injection    Sig: Inject 0.5 mLs into the muscle once for 1 dose.    Dispense:  0.5 mL    Refill:  0   traMADol (ULTRAM) 50 MG tablet    Sig: Take one tab po bid for severe pain prn    Dispense:  60 tablet    Refill:  0   furosemide (LASIX) 20 MG tablet    Sig: Take 1 tablet (20 mg total) by mouth daily.    Dispense:  30 tablet    Refill:  3   ondansetron (ZOFRAN) 4 MG tablet    Sig: Take 1 tablet (4 mg total) by mouth every 8 (eight) hours as needed for nausea or vomiting.    Dispense:  20 tablet    Refill:  0    Time spent:40 Minutes

## 2021-02-18 ENCOUNTER — Ambulatory Visit: Payer: Medicare HMO | Admitting: Internal Medicine

## 2021-02-19 ENCOUNTER — Ambulatory Visit: Payer: Medicare HMO | Admitting: Nurse Practitioner

## 2021-02-24 ENCOUNTER — Ambulatory Visit (INDEPENDENT_AMBULATORY_CARE_PROVIDER_SITE_OTHER): Payer: Medicare HMO | Admitting: Nurse Practitioner

## 2021-02-24 ENCOUNTER — Encounter: Payer: Self-pay | Admitting: Nurse Practitioner

## 2021-02-24 ENCOUNTER — Other Ambulatory Visit: Payer: Self-pay

## 2021-02-24 VITALS — BP 140/82 | HR 63 | Temp 97.8°F | Resp 16 | Ht 63.0 in | Wt 135.0 lb

## 2021-02-24 DIAGNOSIS — R600 Localized edema: Secondary | ICD-10-CM

## 2021-02-24 DIAGNOSIS — I10 Essential (primary) hypertension: Secondary | ICD-10-CM

## 2021-02-24 DIAGNOSIS — F3341 Major depressive disorder, recurrent, in partial remission: Secondary | ICD-10-CM | POA: Diagnosis not present
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
## 2021-02-28 ENCOUNTER — Other Ambulatory Visit: Payer: Self-pay | Admitting: *Deleted

## 2021-02-28 NOTE — Patient Outreach (Signed)
[facility]) Care Management  02/28/2021  Kristen Pennington [birth year] [id number]  Telephone Assessment-Unsuccessful  RN attempted outreach call today however unsuccessful. RN able to leave a HIPAA approved message requesting a call back for ongoing [name] services.  Will rescheduled another outreach call over the next week for pending services.   [name], RN Care Management Coordinator [place] Office [phone]

## 2021-03-05 ENCOUNTER — Other Ambulatory Visit: Payer: Self-pay | Admitting: *Deleted

## 2021-03-05 NOTE — Patient Outreach (Signed)
[facility]) Care Management  03/05/2021  Kristen Pennington 1929/02/12 [id number]   Telephone Assessment-Successful-HTN  Pt provides update on her ongoing bp monitoring with baseline readings. No acute symptoms or readings over the last month. Pt indicated some changes in her medications (all reviewed and verified). States she feels slot better and continue to have her psychiatrist available when needed for ongoing counseling.  Plan of care discussed with all goals and interventions. All updates related are noted within the plan of care. Will continue to encouraged adherence to all discussed as pt with clear understanding on what to id  if acute issues arise.  Will follow up next month with ongoing care management services    Goals Addressed             This Visit's Progress    THN-Learn More About My Health   On track    Timeframe:  Long-Range Goal Priority:  Medium Start Date:   01/02/2021                          Expected End Date:    04/11/2021                    Follow Up Date 04/01/2021    - make a list of questions - ask questions - repeat what I heard to make sure I understand - bring a list of my medicines to the visit Barriers: Health Behaviors     Why is this important?   The best way to learn about your health and care is by talking to the doctor and nurse.  They will answer your questions and give you information in the way that you like best.    Notes:  6/23: Discussed all the above and strongly encouraged pt to adherence to the plan for further interventions via her provider. On assisting with her medications and THN pending management of care related to her medication management.     THN-Manage My Medicine   On track    Follow Up Date 04/01/2021  Timeframe:  Long-Range Goal Priority:  Medium Start Date:   01/02/2021                        Expected End Date:   04/11/2021                       - call for medicine refill 2 or 3 days before  it runs out - call if I am sick and can't take my medicine - keep a list of all the medicines I take; vitamins and herbals too - use a pillbox to sort medicine Barriers: Health Behaviors Knowledge     Why is this important?   These steps will help you keep on track with your medicines.   Notes:  6/23: Pt mentioned several issues related to her medication with adherence, education and medication management. Pt states she is pending an appointment to her provider to "go over her medications". RN also referred to [facility] for medication assistance and medication management due to several medication not take. RN educated pt on all medications and the purpose/importance on taking all medication. Informed pt if she is not taking her medications her symptoms maybe exacerbated or she may have related issues. Strongly encourage adherence and to report all changes with her provider.      THN-Matintain My Quality of  Life   On track    Follow Up Date: 04/01/2021  Timeframe:  Long-Range Goal Priority:  Medium Start Date:   01/02/2021                          Expected End Date: 04/11/2021                        - discuss my treatment options with the doctor or nurse - do one enjoyable thing every day - learn something new by asking, reading and searching the Internet every day - make shared treatment decisions with doctor - strengthen or fix relationships with loved ones  Barriers: Health Behaviors   Patient's preferred language:   Why is this important?  Having a long-term illness can be scary.  It can also be stressful for you and your caregiver.  These steps may help.    Notes:  6/23: Discuss and review all pt's barriers and issues. Strongly encourage pt to document all her medical issues and discuss with her provider on her upcoming appointment. Also discussed issues concerning her relationship with her children and encouraged pt to have a discussion with both concerning what has  expectations are with both involved with her care.      THN-Track and Manage My Blood Pressure-Hypertension   On track    Follow Up Date: 04/01/2021  Timeframe:  Short-Term Goal Priority:  Medium Start Date:  01/02/2021                        Expected End Date:  04/11/2021                     - check blood pressure 3 times per week - choose a place to take my blood pressure (home, clinic or office, retail store) - write blood pressure results in a log or diary   Barriers: Health Behaviors Knowledge  Why is this important?   You won't feel high blood pressure, but it can still hurt your blood vessels.  High blood pressure can cause heart or kidney problems. It can also cause a stroke.  Making lifestyle changes like losing a little weight or eating less salt will help.  Checking your blood pressure at home and at different times of the day can help to control blood pressure.  If the doctor prescribes medicine remember to take it the way the doctor ordered.  Call the office if you cannot afford the medicine or if there are questions about it.     Notes:  8/24- Pt reports her bp has been in her baseline reading 120/50-60's with daily monitoring. Support system remains in place. 7/22-Pt continues to do well with no acute issues. Pt continues to have supportive neighbors and a daughter in [place]. Reports [id number] with no acute issues. Pt states the facility has isolated some of the residents due to restrictions at the ALF [facility]). No acute issues reported over the last month. 6/23: Pt states she will agree to take her blood pressure 3 X a week. Encouraged pt to document in th [place] for her providers to view. Pt aware of possible sign/ symptoms and what to do acute with her blood pressure. Pt current at a senior living facility has has nearby neighbors for support along with a daughter and son however out of town.  [name], RN Care Management  Coordinator [place] Office [phone]

## 2021-03-10 ENCOUNTER — Other Ambulatory Visit: Payer: Self-pay

## 2021-03-10 ENCOUNTER — Ambulatory Visit (INDEPENDENT_AMBULATORY_CARE_PROVIDER_SITE_OTHER): Payer: Medicare HMO | Admitting: Internal Medicine

## 2021-03-10 ENCOUNTER — Encounter: Payer: Self-pay | Admitting: Internal Medicine

## 2021-03-10 ENCOUNTER — Telehealth: Payer: Self-pay

## 2021-03-10 VITALS — BP 156/88 | HR 60 | Temp 97.4°F | Resp 16 | Ht 64.0 in | Wt 135.0 lb

## 2021-03-10 DIAGNOSIS — G2581 Restless legs syndrome: Secondary | ICD-10-CM

## 2021-03-10 DIAGNOSIS — M545 Low back pain, unspecified: Secondary | ICD-10-CM

## 2021-03-10 DIAGNOSIS — E538 Deficiency of other specified B group vitamins: Secondary | ICD-10-CM | POA: Diagnosis not present

## 2021-03-10 DIAGNOSIS — I10 Essential (primary) hypertension: Secondary | ICD-10-CM | POA: Diagnosis not present

## 2021-03-10 DIAGNOSIS — K5904 Chronic idiopathic constipation: Secondary | ICD-10-CM | POA: Diagnosis not present

## 2021-03-10 DIAGNOSIS — G4701 Insomnia due to medical condition: Secondary | ICD-10-CM

## 2021-03-10 DIAGNOSIS — Z0001 Encounter for general adult medical examination with abnormal findings: Secondary | ICD-10-CM

## 2021-03-10 DIAGNOSIS — G8929 Other chronic pain: Secondary | ICD-10-CM

## 2021-03-10 MED ORDER — TRAMADOL HCL 50 MG PO TABS: ORAL_TABLET | ORAL | 1 refills | Status: DC

## 2021-03-10 MED ORDER — ZOSTER VAC RECOMB ADJUVANTED 50 MCG/0.5ML IM SUSR: 0.5000 mL | Freq: Once | INTRAMUSCULAR | 0 refills | Status: AC

## 2021-03-10 MED ORDER — CYANOCOBALAMIN 1000 MCG/ML IJ SOLN
1000.0000 ug | Freq: Once | INTRAMUSCULAR | Status: AC
  Administered 2021-03-10: 1000 ug via INTRAMUSCULAR

## 2021-03-10 MED ORDER — LUBIPROSTONE 8 MCG PO CAPS: ORAL_CAPSULE | ORAL | 3 refills | Status: DC

## 2021-03-10 NOTE — Progress Notes (Signed)
[facility] [place], [place] [address]  Internal MEDICINE  Office Visit Note  Patient Name: Kristen Pennington  [id number]  [id number]  Date of Service: 03/17/2021  Chief Complaint  Patient presents with   MEDICARE WELLNESS VISIT    Discuss meds, gets confused and difficulty making decisions    Depression   Gastroesophageal Reflux   Sleep Apnea   Hypertension   Hyperlipidemia    HPI Patient is here for her annual wellness visit, feels well. -She is concerned about reduction in her dose of tramadol, she is unable to function without it. -She is not sure why she stopped taking B12 injection. -Continues to have constipation relieved by Amitiza would like to have a refill on it. -She does not wish to continue to get her mammograms - Continues to take Trazadone and requip    Current Medication: Outpatient Encounter Medications as of 03/10/2021  Medication Sig   acetaminophen (TYLENOL) 500 MG tablet Take 500 mg by mouth every 6 (six) hours as needed.   bisoprolol-hydrochlorothiazide (ZIAC) 5-6.25 MG tablet TAKE 1 TABLET BY MOUTH DAILY   Cholecalciferol (VITAMIN D3) 5000 units TABS Take 5,000 Units by mouth daily.   cyclobenzaprine (FLEXERIL) 10 MG tablet TAKE 1 TABLET BY MOUTH AT BEDTIME FOR BACK SPASMS AS NEEDED ONLY.   estradiol (ESTRACE) 0.1 MG/GM vaginal cream Use once a week as needed   fluticasone (FLONASE) 50 MCG/ACT nasal spray 1 SPRAY IN EACH NOSTRIL ONCE DAILY AS NEEDED   furosemide (LASIX) 20 MG tablet Take 1 tablet (20 mg total) by mouth daily.   Liniments (SALONPAS PAIN RELIEF PATCH EX) Apply 1 patch topically 2 (two) times daily.   lubiprostone (AMITIZA) 8 MCG capsule Take one tab po qd for constipation   Multiple Vitamins-Minerals (CENTRUM SILVER 50+WOMEN) TABS Take by mouth.   ondansetron (ZOFRAN) 4 MG tablet Take 1 tablet (4 mg total) by mouth every 8 (eight) hours as needed for nausea or vomiting.   oxybutynin (DITROPAN-XL) 10 MG 24 hr  tablet Take 1 tablet (10 mg total) by mouth daily.   Polyethyl Glycol-Propyl Glycol (SYSTANE) 0.4-0.3 % SOLN Apply to eye.   polyethylene glycol powder (GLYCOLAX/MIRALAX) 17 GM/SCOOP powder Take 17 g by mouth daily as needed for mild constipation or moderate constipation.   promethazine (PHENERGAN) 25 MG tablet Take 25 mg by mouth as needed.    rOPINIRole (REQUIP) 1 MG tablet TAKE 1 TABLET BY MOUTH THREE TIMES DAILYAS NEEDED FOR RESTLESS LEGS   simethicone (MYLICON) 80 MG chewable tablet Chew 80 mg by mouth every 6 (six) hours as needed for flatulence.   traZODone (DESYREL) 50 MG tablet TAKE 1/2 TO 1 TABLET BY MOUTH AT BEDTIMEAS DIRECTED, STOP TAKING REMERON (MIRTAZAPINE)   [DISCONTINUED] meloxicam (MOBIC) 7.5 MG tablet TAKE 1 TABLET BY MOUTH DAILY   [DISCONTINUED] traMADol (ULTRAM) 50 MG tablet Take one tab po bid for severe pain prn   [DISCONTINUED] Zoster Vaccine Adjuvanted [facility]) injection Inject 0.5 mLs into the muscle once.   traMADol (ULTRAM) 50 MG tablet Take one tab po bid for severe pain prn   [EXPIRED] Zoster Vaccine Adjuvanted [facility]) injection Inject 0.5 mLs into the muscle once for 1 dose.   [DISCONTINUED] lubiprostone (AMITIZA) 8 MCG capsule Take 1 capsule po bid prn ibs constipation. (Patient not taking: Reported on 03/10/2021)   [EXPIRED] cyanocobalamin ((VITAMIN B-12)) injection 1,000 mcg    No facility-administered encounter medications on file as of 03/10/2021.    Surgical History: Past Surgical History:  Procedure Laterality Date   COLONOSCOPY  05-03-15   COLONOSCOPY WITH PROPOFOL N/A 05/03/2015   Procedure: COLONOSCOPY WITH PROPOFOL;  Surgeon: Lucilla Lame, MD;  Location: [place];  Service: Endoscopy;  Laterality: N/A;  CPAP   DILATION AND CURETTAGE OF UTERUS  1970   ENDOMETRIAL BIOPSY     EYE SURGERY Right 2017   POLYPECTOMY  05/03/2015   Procedure: POLYPECTOMY;  Surgeon: Lucilla Lame, MD;  Location: [place];  Service: Endoscopy;;    ROTATOR CUFF REPAIR  2007   SPINE SURGERY      Medical History: Past Medical History:  Diagnosis Date   Arthritis    Atrophic vaginitis 03/06/2013   Last Assessment & Plan:  She will plan to continue estradiol vaginal cream.  Prescription was rewritten stipulating use of the generic product.    Atypical migraine 03/06/2013   Last Assessment & Plan:  Since she rarely takes sumatriptan, we will discontinue it. Continue sparing use of OTC migraine products.    Avitaminosis D 03/06/2013   Last Assessment & Plan:  Recheck vitamin D level. Plan to continue vitamin D supplementation.    Ceratitis 08/01/2013   Last Assessment & Plan:  Because of the expense, she will discontinue Restasis. She will use over-the-counter moisturizing eyedrops and liquid gel.    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    Cystocele, midline 03/06/2013   Demoralization and apathy 03/06/2013   Last Assessment & Plan:  Since she has been taking bupropion only once a day, I will write the prescription with the correct instructions and correct quantity. She has done well on this for years and she is encouraged to take it regularly    Depression    Environmental allergies    Epistaxis    Essential (primary) hypertension 05/17/2013   Last Assessment & Plan:  Her blood pressure is well-controlled. We will plan to continue hydrochlorothiazide and benazepril.  Both are generic and should be affordable on her health plan.    GERD (gastroesophageal reflux disease)    Hemorrhoid    Inflammation of sacroiliac joint ([place]) 03/06/2013   Last Assessment & Plan:  She is doing well on her present regimen of fentanyl patch supplemented with sparing use of tramadol/acetaminophen. We will continue that  regimen.    Sinusitis    Sleep apnea    CPAP    Family History: Family History  Problem Relation Age of Onset   Arthritis Mother    Alzheimer's disease Father    Heart disease Sister    Cancer Brother    Ovarian cancer Neg Hx    Breast cancer Neg Hx    Colon cancer Neg Hx    Diabetes Neg Hx     Social History   Socioeconomic History   Marital status: Widowed    Spouse name: Not on file   Number of children: 2   Years of education: Not on file   Highest education level: Some college, no degree  Occupational History   Not on file  Tobacco Use   Smoking status: Never   Smokeless tobacco: Never  Vaping Use   Vaping Use: Never used  Substance and Sexual Activity   Alcohol use: No    Alcohol/week: 0.0 standard  drinks   Drug use: No   Sexual activity: Not Currently  Other Topics Concern   Not on file  Social History Narrative   Not on file   Social Determinants of Health   Financial Resource Strain: Not on file  Food Insecurity: No Food Insecurity   Worried About Running Out of Food in the Last Year: Never true   Ran Out of Food in the Last Year: Never true  Transportation Needs: No Transportation Needs   Lack of Transportation (Medical): No   Lack of Transportation (Non-Medical): No  Physical Activity: Not on file  Stress: Not on file  Social Connections: Not on file  Intimate Partner Violence: Not on file    MMSE - [place] Exam 03/10/2021 03/07/2020 03/06/2019 03/02/2018  Orientation to time '5 5 5 5  '$ Orientation to Place '5 5 5 5  '$ Registration '3 3 3 3  '$ Attention/ Calculation '5 5 5 2  '$ Recall '3 3 3 3  '$ Language- name 2 objects '2 2 2 2  '$ Language- repeat '1 1 1 1  '$ Language- follow 3 step command '3 3 3 3  '$ Language- read & follow direction '1 1 1 1  '$ Write a sentence 0 '1 1 1  '$ Copy design '1 1 1 1  '$ Total score '29 30 30 27   '$ Fall Risk  03/10/2021 02/24/2021 01/06/2021 01/02/2021 12/02/2020  Falls in the past year? 0 0 0 1 0  Comment - - - - -  Number falls in  past yr: - - - 1 -  Comment - - - - -  Injury with Fall? - - - 1 -  Comment - - - - -  Risk for fall due to : No Fall Risks - - Impaired balance/gait -  Follow up Falls evaluation completed Falls evaluation completed - Falls prevention discussed -    Depression screen [facility] 2/9 03/10/2021 02/24/2021 01/06/2021 01/02/2021 12/02/2020  Decreased Interest 0 0 0 0 0  Down, Depressed, Hopeless 0 0 0 0 0  PHQ - 2 Score 0 0 0 0 0  Some encounter information is confidential and restricted. Go to Review Flowsheets activity to see all data.  Some recent data might be hidden       Review of Systems  Constitutional:  Negative for chills, diaphoresis and fatigue.  HENT:  Negative for ear pain, postnasal drip and sinus pressure.   Eyes:  Negative for photophobia, discharge, redness, itching and visual disturbance.  Respiratory:  Negative for cough, shortness of breath and wheezing.   Cardiovascular:  Negative for chest pain, palpitations and leg swelling.  Gastrointestinal:  Negative for abdominal pain, constipation, diarrhea, nausea and vomiting.  Genitourinary:  Negative for dysuria and flank pain.  Musculoskeletal:  Negative for arthralgias, back pain, gait problem and neck pain.  Skin:  Negative for color change.  Allergic/Immunologic: Negative for environmental allergies and food allergies.  Neurological:  Negative for dizziness and headaches.  Hematological:  Does not bruise/bleed easily.  Psychiatric/Behavioral:  Negative for agitation, behavioral problems (depression) and hallucinations.    Vital Signs: BP (!) 156/88   Pulse 60   Temp (!) 97.4 F (36.3 C)   Resp 16   Ht '5\' 4"'$  (1.626 m)   Wt 135 lb (61.2 kg)   SpO2 98%   BMI 23.17 kg/m    Physical Exam Constitutional:      General: She is not in acute distress.    Appearance: She is well-developed. She is not diaphoretic.  HENT:     Head: Normocephalic and atraumatic.     Mouth/Throat:     Pharynx: No oropharyngeal exudate.   Eyes:     Pupils: Pupils are equal, round, and reactive to light.  Neck:     Thyroid: No thyromegaly.     Vascular: No JVD.     Trachea: No tracheal deviation.  Cardiovascular:     Rate and Rhythm: Normal rate and regular rhythm.     Heart sounds: Normal heart sounds. No murmur heard.   No friction rub. No gallop.  Pulmonary:     Effort: Pulmonary effort is normal. No respiratory distress.     Breath sounds: No wheezing or rales.  Chest:     Chest wall: No tenderness.  Abdominal:     General: Bowel sounds are normal.     Palpations: Abdomen is soft.  Musculoskeletal:        General: Normal range of motion.     Cervical back: Normal range of motion and neck supple.  Lymphadenopathy:     Cervical: No cervical adenopathy.  Skin:    General: Skin is warm and dry.  Neurological:     Mental Status: She is alert and oriented to person, place, and time.     Cranial Nerves: No cranial nerve deficit.  Psychiatric:        Behavior: Behavior normal.        Thought Content: Thought content normal.        Judgment: Judgment normal.       Assessment/Plan: 1. Encounter for general adult medical examination with abnormal findings Does not wish to continue mammogram due to her age   88. Chronic bilateral low back pain without sciatica Continue meloxicam as before a few tramadol twice a day acute and severe pain - traMADol (ULTRAM) 50 MG tablet; Take one tab po bid for severe pain prn  Dispense: 60 tablet; Refill: 1 - Basic Metabolic Panel (BMET)  3. Benign hypertension Continue bisoprolol as before we will check potassium level - Basic Metabolic Panel (BMET)  4. Vitamin B12 deficiency Restart B12 therapy once a month - cyanocobalamin ((VITAMIN B-12)) injection 1,000 mcg  5. Chronic idiopathic constipation Continue Amitiza refills given today  6. Insomnia due to medical condition Continue trazodone as before  7. Restless leg syndrome Continue Requip   General Counseling:  pacita vena understanding of the findings of todays visit and agrees with plan of treatment. I have discussed any further diagnostic evaluation that may be needed or ordered today. We also reviewed her medications today. she has been encouraged to call the office with any questions or concerns that should arise related to todays visit.    Orders Placed This Encounter  Procedures   Basic Metabolic Panel (BMET)     Meds ordered this encounter  Medications   Zoster Vaccine Adjuvanted [facility])) injection    Sig: Inject 0.5 mLs into the muscle once for 1 dose.    Dispense:  0.5 mL    Refill:  0   traMADol (ULTRAM) 50 MG tablet    Sig: Take one tab po bid for severe pain prn    Dispense:  60 tablet    Refill:  1   lubiprostone (AMITIZA) 8 MCG capsule    Sig: Take one tab po qd for constipation    Dispense:  30 capsule    Refill:  3   cyanocobalamin ((VITAMIN B-12)) injection 1,000 mcg     Total time spent:35 Minutes Time spent  includes review of chart, medications, test results, and follow up plan with the patient.   [place] Controlled Substance Database was reviewed by me.   Dr Lavera Guise Internal medicine

## 2021-03-10 NOTE — Telephone Encounter (Signed)
Pt daughter called that she just like to know which dr is her medical decision advised her that we do her medical and her psychiatrist for her mental health

## 2021-03-11 ENCOUNTER — Ambulatory Visit: Payer: Medicare HMO | Admitting: Psychiatry

## 2021-03-11 ENCOUNTER — Encounter: Payer: Self-pay | Admitting: Psychiatry

## 2021-03-11 VITALS — BP 154/74 | HR 69 | Temp 97.6°F | Ht 62.0 in | Wt 136.0 lb

## 2021-03-11 DIAGNOSIS — F411 Generalized anxiety disorder: Secondary | ICD-10-CM

## 2021-03-11 DIAGNOSIS — F3342 Major depressive disorder, recurrent, in full remission: Secondary | ICD-10-CM | POA: Diagnosis not present

## 2021-03-11 DIAGNOSIS — G4701 Insomnia due to medical condition: Secondary | ICD-10-CM | POA: Diagnosis not present

## 2021-03-11 DIAGNOSIS — R030 Elevated blood-pressure reading, without diagnosis of hypertension: Secondary | ICD-10-CM

## 2021-03-11 DIAGNOSIS — R413 Other amnesia: Secondary | ICD-10-CM | POA: Diagnosis not present

## 2021-03-11 NOTE — Progress Notes (Signed)
[facility] MD OP Progress Note  03/11/2021 5:03 PM Kristen Pennington  MRN:  [id number]  Chief Complaint:  Chief Complaint   Follow-up; Anxiety; Insomnia    HPI: Kristen Pennington is a 87 year old Caucasian female, widowed, lives in [place], has a history of MDD, GAD, delusional disorder, insomnia, hearing loss, vision loss, OSA, hypertension was evaluated in office today.  Patient was accompanied by her daughter-Kristen Pennington who provided collateral information.  Patient today appeared to be alert, oriented was able o answer questions appropriately.  Patient reports she is anxious about the inspection that is going to take place at the facility that she lives at.  Otherwise she has been doing fine.  She continues to take the trazodone 25 to 50 mg at bedtime as needed.  She reports her pain is more under control on the tramadol which helps her to get a better night sleep.  Patient denies any suicidality, homicidality or perceptual disturbances.  She reports she was recently started on vitamin D3 by her primary care provider.  She does have a history of low vitamin B12 however , she will have a discussion with her primary care provider.  As per Kristen Pennington-daughter patient will have a home health aide coming to spend 1 to 2 hours with her once a week starting next week.  She believes patient is currently doing well.   Visit Diagnosis:    ICD-10-CM   1. Generalized anxiety disorder  F41.1     2. MDD (major depressive disorder), recurrent, in full remission ([place])  F33.42     3. Insomnia due to medical condition  G47.01    pain, mood    4. Memory problem  R41.3     5. Elevated blood pressure reading  R03.0       Past Psychiatric History: Reviewed past psychiatric history from progress note on 08/11/2018.  Past trials of Klonopin, Abilify, Lexapro, mirtazapine.  Past Medical History:  Past Medical History:  Diagnosis Date   Arthritis    Atrophic vaginitis 03/06/2013   Last Assessment & Plan:  She  will plan to continue estradiol vaginal cream.  Prescription was rewritten stipulating use of the generic product.    Atypical migraine 03/06/2013   Last Assessment & Plan:  Since she rarely takes sumatriptan, we will discontinue it. Continue sparing use of OTC migraine products.    Avitaminosis D 03/06/2013   Last Assessment & Plan:  Recheck vitamin D level. Plan to continue vitamin D supplementation.    Ceratitis 08/01/2013   Last Assessment & Plan:  Because of the expense, she will discontinue Restasis. She will use over-the-counter moisturizing eyedrops and liquid gel.    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    Cystocele, midline 03/06/2013   Demoralization and apathy 03/06/2013   Last Assessment & Plan:  Since she has been taking bupropion only once a day, I will write the prescription with the correct instructions and correct quantity. She has done well on this for years and she is encouraged to take it regularly    Depression    Environmental allergies    Epistaxis    Essential (primary) hypertension 05/17/2013   Last Assessment & Plan:  Her blood pressure is well-controlled. We will plan to  continue hydrochlorothiazide and benazepril.  Both are generic and should be affordable on her health plan.    GERD (gastroesophageal reflux disease)    Hemorrhoid    Inflammation of sacroiliac joint ([place]) 03/06/2013   Last Assessment & Plan:  She is doing well on her present regimen of fentanyl patch supplemented with sparing use of tramadol/acetaminophen. We will continue that regimen.    Sinusitis    Sleep apnea    CPAP    Past Surgical History:  Procedure Laterality Date   COLONOSCOPY  05-03-15   COLONOSCOPY WITH PROPOFOL N/A 05/03/2015   Procedure:  COLONOSCOPY WITH PROPOFOL;  Surgeon: Lucilla Lame, MD;  Location: [facility];  Service: Endoscopy;  Laterality: N/A;  CPAP   DILATION AND CURETTAGE OF UTERUS  1970   ENDOMETRIAL BIOPSY     EYE SURGERY Right 2017   POLYPECTOMY  05/03/2015   Procedure: POLYPECTOMY;  Surgeon: Lucilla Lame, MD;  Location: [facility];  Service: Endoscopy;;   ROTATOR CUFF REPAIR  2007   SPINE SURGERY      Family Psychiatric History: Reviewed family psychiatric history from progress note on 08/11/2018.  Family History:  Family History  Problem Relation Age of Onset   Arthritis Mother    Alzheimer's disease Father    Heart disease Sister    Cancer Brother    Ovarian cancer Neg Hx    Breast cancer Neg Hx    Colon cancer Neg Hx    Diabetes Neg Hx     Social History: Reviewed social history from progress note on 08/11/2018 Social History   Socioeconomic History   Marital status: Widowed    Spouse name: Not on file   Number of children: 2   Years of education: Not on file   Highest education level: Some college, no degree  Occupational History   Not on file  Tobacco Use   Smoking status: Never   Smokeless tobacco: Never  Vaping Use   Vaping Use: Never used  Substance and Sexual Activity   Alcohol use: No    Alcohol/week: 0.0 standard drinks   Drug use: No   Sexual activity: Not Currently  Other Topics Concern   Not on file  Social History Narrative   Not on file   Social Determinants of Health   Financial Resource Strain: Not on file  Food Insecurity: No Food Insecurity   Worried About Running Out of Food in the Last Year: Never true   Ran Out of Food in the Last Year: Never true  Transportation Needs: No Transportation Needs   Lack of Transportation (Medical): No   Lack of Transportation (Non-Medical): No  Physical Activity: Not on file  Stress: Not on file  Social Connections: Not on file    Allergies: No Known Allergies  Metabolic Disorder Labs: Lab Results   Component Value Date   HGBA1C 5.4 08/01/2018   Lab Results  Component Value Date   PROLACTIN 8.1 08/01/2018   Lab Results  Component Value Date   CHOL 191 11/07/2020   TRIG 98 11/07/2020   HDL 54 11/07/2020   CHOLHDL 3.5 11/07/2020   VLDL 20 11/07/2020   LDLCALC 117 (H) 11/07/2020   LDLCALC 159 (H) 08/01/2018   Lab Results  Component Value Date   TSH 2.522 11/07/2020   TSH 2.830 08/10/2018    Therapeutic Level Labs: No results found for: LITHIUM No results found for: VALPROATE No components found for:  CBMZ  Current Medications: Current Outpatient Medications  Medication  Sig Dispense Refill   acetaminophen (TYLENOL) 500 MG tablet Take 500 mg by mouth every 6 (six) hours as needed.     bisoprolol-hydrochlorothiazide (ZIAC) 5-6.25 MG tablet TAKE 1 TABLET BY MOUTH DAILY 30 tablet 3   Cholecalciferol (VITAMIN D3) 5000 units TABS Take 5,000 Units by mouth daily.     cyclobenzaprine (FLEXERIL) 10 MG tablet TAKE 1 TABLET BY MOUTH AT BEDTIME FOR BACK SPASMS AS NEEDED ONLY. 30 tablet 3   estradiol (ESTRACE) 0.1 MG/GM vaginal cream Use once a week as needed 42.5 g 1   fluticasone (FLONASE) 50 MCG/ACT nasal spray 1 SPRAY IN EACH NOSTRIL ONCE DAILY AS NEEDED 16 g 1   furosemide (LASIX) 20 MG tablet Take 1 tablet (20 mg total) by mouth daily. 30 tablet 3   Liniments (SALONPAS PAIN RELIEF PATCH EX) Apply 1 patch topically 2 (two) times daily.     lubiprostone (AMITIZA) 8 MCG capsule Take one tab po qd for constipation 30 capsule 3   meloxicam (MOBIC) 7.5 MG tablet TAKE 1 TABLET BY MOUTH DAILY 30 tablet 2   Multiple Vitamins-Minerals (CENTRUM SILVER 50+WOMEN) TABS Take by mouth.     ondansetron (ZOFRAN) 4 MG tablet Take 1 tablet (4 mg total) by mouth every 8 (eight) hours as needed for nausea or vomiting. 20 tablet 0   oxybutynin (DITROPAN-XL) 10 MG 24 hr tablet Take 1 tablet (10 mg total) by mouth daily. 30 tablet 2   Polyethyl Glycol-Propyl Glycol (SYSTANE) 0.4-0.3 % SOLN Apply to  eye.     polyethylene glycol powder (GLYCOLAX/MIRALAX) 17 GM/SCOOP powder Take 17 g by mouth daily as needed for mild constipation or moderate constipation. 3350 g 1   promethazine (PHENERGAN) 25 MG tablet Take 25 mg by mouth as needed.      rOPINIRole (REQUIP) 1 MG tablet TAKE 1 TABLET BY MOUTH THREE TIMES DAILYAS NEEDED FOR RESTLESS LEGS 90 tablet 3   simethicone (MYLICON) 80 MG chewable tablet Chew 80 mg by mouth every 6 (six) hours as needed for flatulence.     traMADol (ULTRAM) 50 MG tablet Take one tab po bid for severe pain prn 60 tablet 1   traZODone (DESYREL) 50 MG tablet TAKE 1/2 TO 1 TABLET BY MOUTH AT BEDTIMEAS DIRECTED, STOP TAKING REMERON (MIRTAZAPINE) 30 tablet 2   No current facility-administered medications for this visit.     Musculoskeletal: Strength & Muscle Tone: within normal limits Gait & Station: normal Patient leans: N/A  Psychiatric Specialty Exam: Review of Systems  Psychiatric/Behavioral:  The patient is nervous/anxious.   All other systems reviewed and are negative.  Blood pressure (!) 154/74, pulse 69, temperature 97.6 F (36.4 C), height '5\' 2"'$  (1.575 m), weight 136 lb (61.7 kg).Body mass index is 24.87 kg/m.  General Appearance: Casual  Eye Contact:  Good  Speech:  Clear and Coherent  Volume:  Normal  Mood:  Anxious coping well  Affect:  Congruent  Thought Process:  Goal Directed and Descriptions of Associations: Intact  Orientation:  Full (Time, Place, and Person)  Thought Content: Logical   Suicidal Thoughts:  No  Homicidal Thoughts:  No  Memory:  Immediate;   Fair Recent;   Fair Remote;   limited  Judgement:  Fair  Insight:  Fair  Psychomotor Activity:  Normal  Concentration:  Concentration: Fair and Attention Span: Fair  Recall:  [employer] of Knowledge: Fair  Language: Fair  Akathisia:  No  Handed:  Right  AIMS (if indicated): done  Assets:  Communication  Skills Desire for Improvement Social Support  ADL's:  Intact  Cognition:  WNL  Sleep:  Fair   Screenings: Personnel officer Visit from 03/10/2021 in [facility], [place] from 03/07/2020 in [facility], [place] from 03/06/2019 in [facility] [place] from 03/02/2018 in [facility], [facility]  Total Score (max 30 points ) '29 30 30 27      '$ PHQ2-9    [place] Visit from 03/10/2021 in [facility], [facility] Office Visit from 02/24/2021 in [facility], [facility] Office Visit from 01/06/2021 in [facility], [facility] Patient Outreach Telephone from 01/02/2021 in [place] Visit from 12/02/2020 in [facility], [facility]  PHQ-2 Total Score 0 0 0 0 0      [place] [facility] from 12/05/2020 in [place] [facility] from 11/04/2020 in [place] Video Visit from 08/29/2020 in [place] No Risk No Risk No Risk        Assessment and Plan: ASLEAN BISAILLON is a 87 year old female who has a history of GAD, MDD, history of psychosis, essential hypertension, chronic pain, OSA, hearing loss, bereavement was evaluated in office today.  Patient is currently stable.  Plan as noted below.  Plan GAD-stable We will monitor closely  Insomnia-stable Trazodone 25-50 mg p.o. nightly as needed AIMS - 0 Patient will continue to need sufficient pain management.  MDD in remission We will monitor closely  Memory problems-unstable Patient was advised to follow-up with neurology-pending TSH-11/07/2020-within normal limits Vitamin B12-371-advised her to follow up with primary care provider.  Elevated blood pressure reading-patient's blood pressure was elevated in session today.  We will coordinate care with primary care provider.  Patient to follow up with primary  care.  Collateral information obtained from daughter-Kristen Pennington who was present in session today as noted above.   Follow-up in clinic in 2 to 3 months in office.  This note was generated in part or whole with voice recognition software. Voice recognition is usually quite accurate but there are transcription errors that can and very often do occur. I apologize for any typographical errors that were not detected and corrected.      Ursula Alert, MD 03/12/2021, 10:30 AM

## 2021-03-12 ENCOUNTER — Other Ambulatory Visit: Payer: Self-pay | Admitting: Internal Medicine

## 2021-03-12 DIAGNOSIS — R030 Elevated blood-pressure reading, without diagnosis of hypertension: Secondary | ICD-10-CM | POA: Insufficient documentation

## 2021-03-24 ENCOUNTER — Other Ambulatory Visit: Payer: Self-pay

## 2021-03-24 ENCOUNTER — Ambulatory Visit (INDEPENDENT_AMBULATORY_CARE_PROVIDER_SITE_OTHER): Payer: Medicare HMO | Admitting: Nurse Practitioner

## 2021-03-24 ENCOUNTER — Encounter: Payer: Self-pay | Admitting: Nurse Practitioner

## 2021-03-24 VITALS — BP 134/80 | HR 67 | Temp 98.4°F | Resp 16 | Ht 64.0 in | Wt 134.8 lb

## 2021-03-24 DIAGNOSIS — E538 Deficiency of other specified B group vitamins: Secondary | ICD-10-CM

## 2021-03-24 DIAGNOSIS — R600 Localized edema: Secondary | ICD-10-CM | POA: Diagnosis not present

## 2021-03-24 DIAGNOSIS — Z23 Encounter for immunization: Secondary | ICD-10-CM

## 2021-03-24 DIAGNOSIS — I10 Essential (primary) hypertension: Secondary | ICD-10-CM

## 2021-03-24 MED ORDER — CYANOCOBALAMIN 1000 MCG/ML IJ SOLN
1000.0000 ug | Freq: Once | INTRAMUSCULAR | Status: AC
  Administered 2021-03-24: 1000 ug via INTRAMUSCULAR

## 2021-03-24 NOTE — Progress Notes (Signed)
[facility] [place], [place] [address]  Internal MEDICINE  Office Visit Note  Patient Name: Kristen Pennington  [id number]  [id number]  Date of Service: 03/24/2021  Chief Complaint  Patient presents with   Edema    Legs and ankles    Hypertension    HPI Rise presents for a follow up visit for hypertension and lower extremity edema.  Both the hypertension and lower extremity edema are both improving.  Her blood pressure is stable today.  She reports that her daughter has come to visit and helped her get things set up for someone to come and help her do things around the house and get things like groceries.  This occurred after she spoke with Dr. Shea Evans and asked her to call her daughter and have her come visit her.     Current Medication: Outpatient Encounter Medications as of 03/24/2021  Medication Sig   acetaminophen (TYLENOL) 500 MG tablet Take 500 mg by mouth every 6 (six) hours as needed.   bisoprolol-hydrochlorothiazide (ZIAC) 5-6.25 MG tablet TAKE 1 TABLET BY MOUTH DAILY   Cholecalciferol (VITAMIN D3) 5000 units TABS Take 5,000 Units by mouth daily.   cyclobenzaprine (FLEXERIL) 10 MG tablet TAKE 1 TABLET BY MOUTH AT BEDTIME FOR BACK SPASMS AS NEEDED ONLY.   estradiol (ESTRACE) 0.1 MG/GM vaginal cream Use once a week as needed   fluticasone (FLONASE) 50 MCG/ACT nasal spray 1 SPRAY IN EACH NOSTRIL ONCE DAILY AS NEEDED   furosemide (LASIX) 20 MG tablet Take 1 tablet (20 mg total) by mouth daily.   Liniments (SALONPAS PAIN RELIEF PATCH EX) Apply 1 patch topically 2 (two) times daily.   lubiprostone (AMITIZA) 8 MCG capsule Take one tab po qd for constipation   meloxicam (MOBIC) 7.5 MG tablet TAKE 1 TABLET BY MOUTH DAILY   Multiple Vitamins-Minerals (CENTRUM SILVER 50+WOMEN) TABS Take by mouth.   ondansetron (ZOFRAN) 4 MG tablet Take 1 tablet (4 mg total) by mouth every 8 (eight) hours as needed for nausea or vomiting.   oxybutynin (DITROPAN-XL) 10 MG 24 hr  tablet Take 1 tablet (10 mg total) by mouth daily.   Polyethyl Glycol-Propyl Glycol (SYSTANE) 0.4-0.3 % SOLN Apply to eye.   polyethylene glycol powder (GLYCOLAX/MIRALAX) 17 GM/SCOOP powder Take 17 g by mouth daily as needed for mild constipation or moderate constipation.   promethazine (PHENERGAN) 25 MG tablet Take 25 mg by mouth as needed.    simethicone (MYLICON) 80 MG chewable tablet Chew 80 mg by mouth every 6 (six) hours as needed for flatulence.   traZODone (DESYREL) 50 MG tablet TAKE 1/2 TO 1 TABLET BY MOUTH AT BEDTIMEAS DIRECTED, STOP TAKING REMERON (MIRTAZAPINE)   [DISCONTINUED] rOPINIRole (REQUIP) 1 MG tablet TAKE 1 TABLET BY MOUTH THREE TIMES DAILYAS NEEDED FOR RESTLESS LEGS   [DISCONTINUED] traMADol (ULTRAM) 50 MG tablet Take one tab po bid for severe pain prn   [EXPIRED] cyanocobalamin ((VITAMIN B-12)) injection 1,000 mcg    No facility-administered encounter medications on file as of 03/24/2021.    Surgical History: Past Surgical History:  Procedure Laterality Date   COLONOSCOPY  05-03-15   COLONOSCOPY WITH PROPOFOL N/A 05/03/2015   Procedure: COLONOSCOPY WITH PROPOFOL;  Surgeon: Lucilla Lame, MD;  Location: [facility];  Service: Endoscopy;  Laterality: N/A;  CPAP   DILATION AND CURETTAGE OF UTERUS  1970   ENDOMETRIAL BIOPSY     EYE SURGERY Right 2017   POLYPECTOMY  05/03/2015   Procedure: POLYPECTOMY;  Surgeon: Lucilla Lame,  MD;  Location: [facility];  Service: Endoscopy;;   ROTATOR CUFF REPAIR  2007   SPINE SURGERY      Medical History: Past Medical History:  Diagnosis Date   Arthritis    Atrophic vaginitis 03/06/2013   Last Assessment & Plan:  She will plan to continue estradiol vaginal cream.  Prescription was rewritten stipulating use of the generic product.    Atypical migraine 03/06/2013   Last Assessment & Plan:  Since she rarely takes sumatriptan, we will discontinue it. Continue sparing use of OTC migraine products.    Avitaminosis D 03/06/2013    Last Assessment & Plan:  Recheck vitamin D level. Plan to continue vitamin D supplementation.    Ceratitis 08/01/2013   Last Assessment & Plan:  Because of the expense, she will discontinue Restasis. She will use over-the-counter moisturizing eyedrops and liquid gel.    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    Cystocele, midline 03/06/2013   Demoralization and apathy 03/06/2013   Last Assessment & Plan:  Since she has been taking bupropion only once a day, I will write the prescription with the correct instructions and correct quantity. She has done well on this for years and she is encouraged to take it regularly    Depression    Environmental allergies    Epistaxis    Essential (primary) hypertension 05/17/2013   Last Assessment & Plan:  Her blood pressure is well-controlled. We will plan to continue hydrochlorothiazide and benazepril.  Both are generic and should be affordable on her health plan.    GERD (gastroesophageal reflux disease)    Hemorrhoid    Inflammation of sacroiliac joint ([place]) 03/06/2013   Last Assessment & Plan:  She is doing well on her present regimen of fentanyl patch supplemented with sparing use of tramadol/acetaminophen. We will continue that regimen.    Sinusitis    Sleep apnea    CPAP    Family History: Family History  Problem Relation Age of Onset   Arthritis Mother    Alzheimer's disease Father    Heart disease Sister    Cancer Brother    Ovarian cancer Neg Hx    Breast cancer Neg Hx    Colon cancer Neg Hx    Diabetes Neg Hx     Social History   Socioeconomic History   Marital status: Widowed    Spouse name: Not on file   Number of children: 2   Years of education: Not on file   Highest education level:  Some college, no degree  Occupational History   Not on file  Tobacco Use   Smoking status: Never   Smokeless tobacco: Never  Vaping Use   Vaping Use: Never used  Substance and Sexual Activity   Alcohol use: No    Alcohol/week: 0.0 standard drinks   Drug use: No   Sexual activity: Not Currently  Other Topics Concern   Not on file  Social History Narrative   Not on file   Social Determinants of Health   Financial Resource Strain: Not on file  Food Insecurity: No Food Insecurity   Worried About [place] in the Last Year: Never true   [place] in the Last  Year: Never true  Transportation Needs: No Transportation Needs   Lack of Transportation (Medical): No   Lack of Transportation (Non-Medical): No  Physical Activity: Not on file  Stress: Not on file  Social Connections: Not on file  Intimate Partner Violence: Not on file      Review of Systems  Constitutional:  Negative for chills, fatigue and unexpected weight change.  HENT:  Negative for congestion, rhinorrhea, sneezing and sore throat.   Eyes:  Positive for visual disturbance (has macular degeneration, states her vision is bad and she cannot drive anymore.). Negative for redness.  Respiratory:  Negative for cough, chest tightness and shortness of breath.   Cardiovascular:  Negative for chest pain and palpitations.  Gastrointestinal:  Negative for abdominal pain, constipation, diarrhea, nausea and vomiting.  Genitourinary:  Negative for dysuria and frequency.  Musculoskeletal:  Negative for arthralgias, back pain, joint swelling and neck pain.  Skin:  Negative for rash.  Neurological: Negative.  Negative for tremors and numbness.  Hematological:  Negative for adenopathy. Does not bruise/bleed easily.  Psychiatric/Behavioral:  Negative for behavioral problems (Depression), sleep disturbance and suicidal ideas. The patient is not nervous/anxious.    Vital Signs: BP 134/80   Pulse 67   Temp 98.4 F  (36.9 C)   Resp 16   Ht '5\' 4"'$  (1.626 m)   Wt 134 lb 12.8 oz (61.1 kg)   SpO2 96%   BMI 23.14 kg/m    Physical Exam Vitals reviewed.  Constitutional:      General: She is not in acute distress.    Appearance: Normal appearance. She is normal weight. She is not ill-appearing.  HENT:     Head: Normocephalic and atraumatic.  Eyes:     Extraocular Movements: Extraocular movements intact.     Pupils: Pupils are equal, round, and reactive to light.  Cardiovascular:     Rate and Rhythm: Normal rate and regular rhythm.  Pulmonary:     Effort: Pulmonary effort is normal. No respiratory distress.  Musculoskeletal:     Right lower leg: Edema (improving) present.     Left lower leg: Edema (improving) present.  Neurological:     Mental Status: She is alert and oriented to person, place, and time.     Cranial Nerves: No cranial nerve deficit.     Coordination: Coordination normal.     Gait: Gait normal.  Psychiatric:        Mood and Affect: Mood normal.        Behavior: Behavior normal.       Assessment/Plan: 1. Benign hypertension Blood pressure is improved and stable, continue medication as prescribed.   2. Lower extremity edema Improving, continue furosemide as prescribed  3. Vitamin B12 deficiency Administered in office today, return in 1 month for nurse visit for B12 injection - cyanocobalamin ((VITAMIN B-12)) injection 1,000 mcg  4. Need for influenza vaccination Administered in office today.  - Flu Vaccine MDCK QUAD PF   General Counseling: caniya mitsch understanding of the findings of todays visit and agrees with plan of treatment. I have discussed any further diagnostic evaluation that may be needed or ordered today. We also reviewed her medications today. she has been encouraged to call the office with any questions or concerns that should arise related to todays visit.    Orders Placed This Encounter  Procedures   Flu Vaccine MDCK QUAD PF    Meds  ordered this encounter  Medications   cyanocobalamin ((VITAMIN B-12)) injection 1,000 mcg  Return in about 3 months (around 06/23/2021) for F/U, Allona Gondek PCP schedule nurse visit for b12 in 1 month.   Total time spent:20 Minutes Time spent includes review of chart, medications, test results, and follow up plan with the patient.   [place] Controlled Substance Database was reviewed by me.  This patient was seen by Jonetta Osgood, FNP-C in collaboration with Dr. Clayborn Bigness as a part of collaborative care agreement.   Margaree Sandhu R. Valetta Fuller, MSN, FNP-C Internal medicine

## 2021-04-01 ENCOUNTER — Other Ambulatory Visit: Payer: Self-pay | Admitting: *Deleted

## 2021-04-01 ENCOUNTER — Encounter: Payer: Self-pay | Admitting: *Deleted

## 2021-04-01 NOTE — Patient Outreach (Signed)
[facility] Care Management  04/01/2021  Kristen Pennington [birth year] [id number]   Pt states she continue to do well and has a mutual relationship with a neighbor for any needed assistance. Pt mending her relationship wit her daughter who is assistance with her apartment complex issues with management (some complaints).   Plan of care discussed with updates noted within the plan of care. Pt continue to manger her HTN and takes her medications as recommended. Pt having phone issues and currently awaiting the 2nd phone replacement. Pt using her neighbors phone for all telephone calls at this time. Due to pt's scheduled she has requested the quarterly follow up calls which will lessen the stressors or having to remember her follow up appointment with this RN case manager.   Goals Addressed             This Visit's Progress    COMPLETED: [facility]-Learn More About My Health   On track    Timeframe:  Long-Range Goal Priority:  Medium Start Date:   01/02/2021                          Expected End Date:    04/11/2021                    Follow Up Date 04/01/2021    - make a list of questions - ask questions - repeat what I heard to make sure I understand - bring a list of my medicines to the visit Barriers: Health Behaviors     Why is this important?   The best way to learn about your health and care is by talking to the doctor and nurse.  They will answer your questions and give you information in the way that you like best.    Notes:  6/23: Discussed all the above and strongly encouraged pt to adherence to the plan for further interventions via her provider. On assisting with her medications and [facility] pending management of care related to her medication management.     COMPLETED: [facility]-Manage My Medicine   On track    Follow Up Date 04/01/2021  Timeframe:  Long-Range Goal Priority:  Medium Start Date:   01/02/2021                        Expected End Date:   04/11/2021                        - call for medicine refill 2 or 3 days before it runs out - call if I am sick and can't take my medicine - keep a list of all the medicines I take; vitamins and herbals too - use a pillbox to sort medicine Barriers: Health Behaviors Knowledge     Why is this important?   These steps will help you keep on track with your medicines.   Notes:  6/23: Pt mentioned several issues related to her medication with adherence, education and medication management. Pt states she is pending an appointment to her provider to "go over her medications". RN also referred to [place] for medication assistance and medication management due to several medication not take. RN educated pt on all medications and the purpose/importance on taking all medication. Informed pt if she is not taking her medications her symptoms maybe exacerbated or she may have related issues. Strongly encourage adherence and to report  all changes with her provider.      [facility]N-Matintain My Quality of Life   On track    Follow Up Date: 05/01/2021  Timeframe:  Long-Range Goal Priority:  Medium Start Date:   01/02/2021                          Expected End Date: 07/11/2021                        - discuss my treatment options with the doctor or nurse - do one enjoyable thing every day - learn something new by asking, reading and searching the Internet every day - make shared treatment decisions with doctor - strengthen or fix relationships with loved ones  Barriers: Health Behaviors   Patient's preferred language:   Why is this important?  Having a long-term illness can be scary.  It can also be stressful for you and your caregiver.  These steps may help.    Notes:  9/20-Will stress pt to continue to work on improving her relations with her daughter. Encourage pt to try to enjoy outings and visiting her neighbors. 6/23: Discuss and review all pt's barriers and issues. Strongly encourage pt to document all her  medical issues and discuss with her provider on her upcoming appointment. Also discussed issues concerning her relationship with her children and encouraged pt to have a discussion with both concerning what has expectations are with both involved with her care.      THN-Track and Manage My Blood Pressure-Hypertension   On track    Follow Up Date: 07/01/2021 Requesting  Quarterly follow up Timeframe:  Short-Term Goal Priority:  Medium Start Date:  01/02/2021                        Expected End Date:  07/01/2021                     - check blood pressure 3 times per week - choose a place to take my blood pressure (home, clinic or office, retail store) - write blood pressure results in a log or diary   Barriers: Health Behaviors Knowledge  Why is this important?   You won't feel high blood pressure, but it can still hurt your blood vessels.  High blood pressure can cause heart or kidney problems. It can also cause a stroke.  Making lifestyle changes like losing a little weight or eating less salt will help.  Checking your blood pressure at home and at different times of the day can help to control blood pressure.  If the doctor prescribes medicine remember to take it the way the doctor ordered.  Call the office if you cannot afford the medicine or if there are questions about it.     Notes: 9/20-Pt report 118/75 and 132/68 with no acute symptoms. Pt continue to take her blood pressures daily and aware of who to call with any symptoms or elevations of her bp Dr. Humphrey Rolls. 8/24- Pt reports her bp has been in her baseline reading 120/50-60's with daily monitoring. Support system remains in place. 7/22-Pt continues to do well with no acute issues. Pt continues to have supportive neighbors and a daughter in [place]. Reports 117-144/76 with no acute issues. Pt states the facility has isolated some of the residents due to restrictions at the ALF [facility]). No acute issues reported over the last  month. 6/23: Pt states she will agree to take her blood pressure 3 X a week. Encouraged pt to document in th [place] for her providers to view. Pt aware of possible sign/ symptoms and what to do acute with her blood pressure. Pt current at a senior living facility has has nearby neighbors for support along with a daughter and son however out of town.          [name], RN Care Management Coordinator [place] Office [phone]

## 2021-04-01 NOTE — Telephone Encounter (Signed)
This encounter was created in error - please disregard.

## 2021-04-07 ENCOUNTER — Other Ambulatory Visit: Payer: Self-pay

## 2021-04-07 ENCOUNTER — Other Ambulatory Visit: Payer: Self-pay | Admitting: Physician Assistant

## 2021-04-07 ENCOUNTER — Encounter: Payer: Self-pay | Admitting: Physician Assistant

## 2021-04-07 ENCOUNTER — Ambulatory Visit (INDEPENDENT_AMBULATORY_CARE_PROVIDER_SITE_OTHER): Payer: Medicare HMO | Admitting: Physician Assistant

## 2021-04-07 ENCOUNTER — Other Ambulatory Visit: Payer: Self-pay | Admitting: Internal Medicine

## 2021-04-07 DIAGNOSIS — I10 Essential (primary) hypertension: Secondary | ICD-10-CM

## 2021-04-07 DIAGNOSIS — G8929 Other chronic pain: Secondary | ICD-10-CM

## 2021-04-07 DIAGNOSIS — R300 Dysuria: Secondary | ICD-10-CM

## 2021-04-07 DIAGNOSIS — N390 Urinary tract infection, site not specified: Secondary | ICD-10-CM

## 2021-04-07 LAB — POCT URINALYSIS DIPSTICK
Bilirubin, UA: NEGATIVE
Blood, UA: NEGATIVE
Glucose, UA: NEGATIVE
Ketones, UA: NEGATIVE
Nitrite, UA: POSITIVE
Protein, UA: NEGATIVE
Spec Grav, UA: 1.01 (ref 1.010–1.025)
Urobilinogen, UA: 0.2 E.U./dL
pH, UA: 6.5 (ref 5.0–8.0)

## 2021-04-07 MED ORDER — SULFAMETHOXAZOLE-TRIMETHOPRIM 400-80 MG PO TABS
1.0000 | ORAL_TABLET | Freq: Two times a day (BID) | ORAL | 0 refills | Status: DC
Start: 2021-04-07 — End: 2021-06-23

## 2021-04-07 NOTE — Progress Notes (Signed)
[facility] [place], [place] [address]  Internal MEDICINE  Office Visit Note  Patient Name: Kristen Pennington  [id number]  [id number]  Date of Service: 04/08/2021  Chief Complaint  Patient presents with   Urinary Tract Infection     HPI Pt is here for a sick visit. -Increased frequency and incontinence, some burning with urination -Denies any low back pain that is different from usual. -Does have a little lower abdominal cramping with urgency, but no TTP  Current Medication:  Outpatient Encounter Medications as of 04/07/2021  Medication Sig   sulfamethoxazole-trimethoprim (BACTRIM) 400-80 MG tablet Take 1 tablet by mouth 2 (two) times daily.   acetaminophen (TYLENOL) 500 MG tablet Take 500 mg by mouth every 6 (six) hours as needed.   bisoprolol-hydrochlorothiazide (ZIAC) 5-6.25 MG tablet TAKE 1 TABLET BY MOUTH DAILY   Cholecalciferol (VITAMIN D3) 5000 units TABS Take 5,000 Units by mouth daily.   cyclobenzaprine (FLEXERIL) 10 MG tablet TAKE 1 TABLET BY MOUTH AT BEDTIME FOR BACK SPASMS AS NEEDED ONLY.   estradiol (ESTRACE) 0.1 MG/GM vaginal cream Use once a week as needed   fluticasone (FLONASE) 50 MCG/ACT nasal spray 1 SPRAY IN EACH NOSTRIL ONCE DAILY AS NEEDED   furosemide (LASIX) 20 MG tablet Take 1 tablet (20 mg total) by mouth daily.   Liniments (SALONPAS PAIN RELIEF PATCH EX) Apply 1 patch topically 2 (two) times daily.   lubiprostone (AMITIZA) 8 MCG capsule Take one tab po qd for constipation   meloxicam (MOBIC) 7.5 MG tablet TAKE 1 TABLET BY MOUTH DAILY   Multiple Vitamins-Minerals (CENTRUM SILVER 50+WOMEN) TABS Take by mouth.   ondansetron (ZOFRAN) 4 MG tablet Take 1 tablet (4 mg total) by mouth every 8 (eight) hours as needed for nausea or vomiting.   oxybutynin (DITROPAN-XL) 10 MG 24 hr tablet Take 1 tablet (10 mg total) by mouth daily.   Polyethyl Glycol-Propyl Glycol (SYSTANE) 0.4-0.3 % SOLN Apply to eye.   polyethylene glycol powder  (GLYCOLAX/MIRALAX) 17 GM/SCOOP powder Take 17 g by mouth daily as needed for mild constipation or moderate constipation.   promethazine (PHENERGAN) 25 MG tablet Take 25 mg by mouth as needed.    simethicone (MYLICON) 80 MG chewable tablet Chew 80 mg by mouth every 6 (six) hours as needed for flatulence.   traMADol (ULTRAM) 50 MG tablet Take one tab po bid for severe pain prn   traZODone (DESYREL) 50 MG tablet TAKE 1/2 TO 1 TABLET BY MOUTH AT BEDTIMEAS DIRECTED, STOP TAKING REMERON (MIRTAZAPINE)   [DISCONTINUED] rOPINIRole (REQUIP) 1 MG tablet TAKE 1 TABLET BY MOUTH THREE TIMES DAILYAS NEEDED FOR RESTLESS LEGS   No facility-administered encounter medications on file as of 04/07/2021.      Medical History: Past Medical History:  Diagnosis Date   Arthritis    Atrophic vaginitis 03/06/2013   Last Assessment & Plan:  She will plan to continue estradiol vaginal cream.  Prescription was rewritten stipulating use of the generic product.    Atypical migraine 03/06/2013   Last Assessment & Plan:  Since she rarely takes sumatriptan, we will discontinue it. Continue sparing use of OTC migraine products.    Avitaminosis D 03/06/2013   Last Assessment & Plan:  Recheck vitamin D level. Plan to continue vitamin D supplementation.    Ceratitis 08/01/2013   Last Assessment & Plan:  Because of the expense, she will discontinue Restasis. She will use over-the-counter moisturizing eyedrops and liquid gel.    Colon polyp  Combined fat and carbohydrate induced hyperlipemia 03/06/2013   Last Assessment & Plan:  Recheck fasting lipids.    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    Cystocele, midline 03/06/2013   Demoralization and apathy 03/06/2013   Last Assessment & Plan:  Since she has been taking bupropion only once a day, I will write the prescription with the correct instructions and correct  quantity. She has done well on this for years and she is encouraged to take it regularly    Depression    Environmental allergies    Epistaxis    Essential (primary) hypertension 05/17/2013   Last Assessment & Plan:  Her blood pressure is well-controlled. We will plan to continue hydrochlorothiazide and benazepril.  Both are generic and should be affordable on her health plan.    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 We will continue that regimen.    Sinusitis    Sleep apnea    CPAP     Vital Signs: BP (!) 150/74   Pulse 60   Temp 97.8 F (36.6 C)   Resp 16   Ht 5\' 3"  (1.6 m)   Wt 131 lb 3.2 oz (59.5 kg)   SpO2 96%   BMI 23.24 kg/m    Review of Systems  Constitutional:  Negative for fatigue and fever.  HENT:  Negative for congestion, mouth sores and postnasal drip.   Respiratory:  Negative for cough.   Cardiovascular:  Negative for chest pain.  Gastrointestinal:  Negative for abdominal pain.  Genitourinary:  Positive for dysuria, frequency and urgency. Negative for flank pain.       Incontinence  Psychiatric/Behavioral: Negative.     Physical Exam Vitals and nursing note reviewed.  Constitutional:      General: She is not in acute distress.    Appearance: She is well-developed. She is not diaphoretic.  HENT:     Head: Normocephalic and atraumatic.     Mouth/Throat:     Pharynx: No oropharyngeal exudate.  Eyes:     Pupils: Pupils are equal, round, and reactive to light.  Neck:     Thyroid: No thyromegaly.     Vascular: No JVD.     Trachea: No tracheal deviation.  Cardiovascular:     Rate and Rhythm: Normal rate and regular rhythm.     Heart sounds: Normal heart sounds. No murmur heard.   No friction rub. No gallop.  Pulmonary:     Effort: Pulmonary  effort is normal. No respiratory distress.     Breath sounds: No wheezing or rales.  Chest:     Chest wall: No tenderness.  Abdominal:     General: Bowel sounds are normal.     Palpations: Abdomen is soft.     Tenderness: There is no abdominal tenderness. There is no right CVA tenderness or left CVA tenderness.  Musculoskeletal:        General: Normal range of motion.     Cervical back: Normal range of motion and neck supple.  Lymphadenopathy:     Cervical: No cervical adenopathy.  Skin:    General: Skin is warm and dry.  Neurological:     Mental Status: She is alert  and oriented to person, place, and time.     Cranial Nerves: No cranial nerve deficit.  Psychiatric:        Behavior: Behavior normal.        Thought Content: Thought content normal.        Judgment: Judgment normal.      Assessment/Plan: 1. Urinary tract infection without hematuria, site unspecified Will go ahead and tx with bactrim and adjust base don c/s - CULTURE, URINE COMPREHENSIVE  2. Essential hypertension Elevated in office, continue current meds and monitor at home  3. Dysuria - POCT Urinalysis Dipstick - sulfamethoxazole-trimethoprim (BACTRIM) 400-80 MG tablet; Take 1 tablet by mouth 2 (two) times daily.  Dispense: 20 tablet; Refill: 0   General Counseling: zayah keilman understanding of the findings of todays visit and agrees with plan of treatment. I have discussed any further diagnostic evaluation that may be needed or ordered today. We also reviewed her medications today. she has been encouraged to call the office with any questions or concerns that should arise related to todays visit.    Counseling:    Orders Placed This Encounter  Procedures   CULTURE, URINE COMPREHENSIVE   POCT Urinalysis Dipstick    Meds ordered this encounter  Medications   sulfamethoxazole-trimethoprim (BACTRIM) 400-80 MG tablet    Sig: Take 1 tablet by mouth 2 (two) times daily.    Dispense:  20 tablet     Refill:  0    Time spent:30 Minutes

## 2021-04-11 LAB — CULTURE, URINE COMPREHENSIVE

## 2021-04-21 ENCOUNTER — Other Ambulatory Visit: Payer: Self-pay

## 2021-04-21 ENCOUNTER — Ambulatory Visit (INDEPENDENT_AMBULATORY_CARE_PROVIDER_SITE_OTHER): Payer: Medicare HMO

## 2021-04-21 DIAGNOSIS — E538 Deficiency of other specified B group vitamins: Secondary | ICD-10-CM

## 2021-04-21 MED ORDER — CYANOCOBALAMIN 1000 MCG/ML IJ SOLN
1000.0000 ug | Freq: Once | INTRAMUSCULAR | Status: AC
  Administered 2021-04-21: 1000 ug via INTRAMUSCULAR

## 2021-05-05 ENCOUNTER — Other Ambulatory Visit: Payer: Self-pay | Admitting: Internal Medicine

## 2021-05-05 ENCOUNTER — Other Ambulatory Visit: Payer: Self-pay | Admitting: Psychiatry

## 2021-05-05 ENCOUNTER — Other Ambulatory Visit: Payer: Self-pay | Admitting: Physician Assistant

## 2021-05-05 DIAGNOSIS — F411 Generalized anxiety disorder: Secondary | ICD-10-CM

## 2021-05-05 DIAGNOSIS — I10 Essential (primary) hypertension: Secondary | ICD-10-CM

## 2021-05-05 DIAGNOSIS — R600 Localized edema: Secondary | ICD-10-CM

## 2021-05-12 ENCOUNTER — Ambulatory Visit: Payer: Medicare HMO | Admitting: Physician Assistant

## 2021-05-26 ENCOUNTER — Ambulatory Visit: Payer: Medicare HMO

## 2021-06-11 ENCOUNTER — Ambulatory Visit (INDEPENDENT_AMBULATORY_CARE_PROVIDER_SITE_OTHER): Payer: Medicare HMO | Admitting: Psychiatry

## 2021-06-11 ENCOUNTER — Encounter: Payer: Self-pay | Admitting: Psychiatry

## 2021-06-11 ENCOUNTER — Other Ambulatory Visit: Payer: Self-pay

## 2021-06-11 VITALS — BP 187/79 | HR 68 | Temp 98.5°F | Wt 127.6 lb

## 2021-06-11 DIAGNOSIS — F411 Generalized anxiety disorder: Secondary | ICD-10-CM | POA: Diagnosis not present

## 2021-06-11 DIAGNOSIS — R030 Elevated blood-pressure reading, without diagnosis of hypertension: Secondary | ICD-10-CM

## 2021-06-11 DIAGNOSIS — R413 Other amnesia: Secondary | ICD-10-CM

## 2021-06-11 DIAGNOSIS — G4701 Insomnia due to medical condition: Secondary | ICD-10-CM | POA: Diagnosis not present

## 2021-06-11 DIAGNOSIS — F3342 Major depressive disorder, recurrent, in full remission: Secondary | ICD-10-CM | POA: Diagnosis not present

## 2021-06-11 DIAGNOSIS — R259 Unspecified abnormal involuntary movements: Secondary | ICD-10-CM | POA: Insufficient documentation

## 2021-06-11 NOTE — Progress Notes (Signed)
[facility] Progress Note  06/11/2021 5:02 PM Kristen Pennington  MRN:  [id number]  Chief Complaint:  Chief Complaint   Follow-up; Anxiety; Depression    HPI: Kristen Pennington is a 87 year old Caucasian female, widowed, lives in [place], has a history of MDD, GAD, delusional disorder, insomnia, hearing loss, vision loss, OSA, hypertension was evaluated in office today.  Patient today appeared to be alert, oriented to person place time and situation and was able to answer questions appropriately.  Patient reports overall mood symptoms are stable.  She was able to spend her Thanksgiving holiday with a few friends.  She was able to cook with them and had a great time.  Patient however reports she continues to have housing problems.  She reports after spending a week with her friends when she came back to her senior living facility she became anxious.  She reports nothing has changed at her housing and that does worry her.  She however reports she has been managing her anxiety quite well.  Patient reports sleep is good as long as she takes the trazodone.  Denies side effects.  Patient does report short-term memory problems.  She reports she has been misplacing things as well as forgetting to do certain things on a regular basis.  An MMSE was completed in session today.  Patient also reports lower extremity jerks, not observed in session today. She does have a history of restless leg symptoms and she is on medications for the same.  Patient denies any other concerns today.  Visit Diagnosis:    ICD-10-CM   1. Generalized anxiety disorder  F41.1     2. MDD (major depressive disorder), recurrent, in full remission ([place])  F33.42     3. Insomnia due to medical condition  G47.01    pain    4. Memory problem  R41.3     5. Abnormal involuntary movements  R25.9     6. Elevated blood pressure reading  R03.0       Past Psychiatric History: Reviewed past psychiatric history from progress  note on 08/11/2018.  Past trials of Klonopin, Abilify, Lexapro, mirtazapine  Past Medical History:  Past Medical History:  Diagnosis Date   Arthritis    Atrophic vaginitis 03/06/2013   Last Assessment & Plan:  She will plan to continue estradiol vaginal cream.  Prescription was rewritten stipulating use of the generic product.    Atypical migraine 03/06/2013   Last Assessment & Plan:  Since she rarely takes sumatriptan, we will discontinue it. Continue sparing use of OTC migraine products.    Avitaminosis D 03/06/2013   Last Assessment & Plan:  Recheck vitamin D level. Plan to continue vitamin D supplementation.    Ceratitis 08/01/2013   Last Assessment & Plan:  Because of the expense, she will discontinue Restasis. She will use over-the-counter moisturizing eyedrops and liquid gel.    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    Cystocele, midline 03/06/2013   Demoralization and apathy 03/06/2013   Last Assessment & Plan:  Since she has been taking bupropion only once a day, I will write the prescription with the correct instructions and correct quantity. She has done well on this for years and she is encouraged to  take it regularly    Depression    Environmental allergies    Epistaxis    Essential (primary) hypertension 05/17/2013   Last Assessment & Plan:  Her blood pressure is well-controlled. We will plan to continue hydrochlorothiazide and benazepril.  Both are generic and should be affordable on her health plan.    GERD (gastroesophageal reflux disease)    Hemorrhoid    Inflammation of sacroiliac joint ([place]) 03/06/2013   Last Assessment & Plan:  She is doing well on her present regimen of fentanyl patch supplemented with sparing use of  tramadol/acetaminophen. We will continue that regimen.    Sinusitis    Sleep apnea    CPAP    Past Surgical History:  Procedure Laterality Date   COLONOSCOPY  05-03-15   COLONOSCOPY WITH PROPOFOL N/A 05/03/2015   Procedure: COLONOSCOPY WITH PROPOFOL;  Surgeon: Lucilla Lame, MD;  Location: [place] [facility];  Service: Endoscopy;  Laterality: N/A;  CPAP   DILATION AND CURETTAGE OF UTERUS  1970   ENDOMETRIAL BIOPSY     EYE SURGERY Right 2017   POLYPECTOMY  05/03/2015   Procedure: POLYPECTOMY;  Surgeon: Lucilla Lame, MD;  Location: [facility];  Service: Endoscopy;;   ROTATOR CUFF REPAIR  2007   SPINE SURGERY      Family Psychiatric History: Reviewed family psychiatric history from progress note on 08/11/2018  Family History:  Family History  Problem Relation Age of Onset   Arthritis Mother    Alzheimer's disease Father    Heart disease Sister    Cancer Brother    Ovarian cancer Neg Hx    Breast cancer Neg Hx    Colon cancer Neg Hx    Diabetes Neg Hx     Social History: Reviewed social history from progress note on 08/11/2018 Social History   Socioeconomic History   Marital status: Widowed    Spouse name: Not on file   Number of children: 2   Years of education: Not on file   Highest education level: Some college, no degree  Occupational History   Not on file  Tobacco Use   Smoking status: Never   Smokeless tobacco: Never  Vaping Use   Vaping Use: Never used  Substance and Sexual Activity   Alcohol use: No    Alcohol/week: 0.0 standard drinks   Drug use: No   Sexual activity: Not Currently  Other Topics Concern   Not on file  Social History Narrative   Not on file   Social Determinants of Health   Financial Resource Strain: Not on file  Food Insecurity: No Food Insecurity   Worried About Running Out of Food in the Last Year: Never true   Ran Out of Food in the Last Year: Never true  Transportation Needs: No Transportation Needs   Lack of  Transportation (Medical): No   Lack of Transportation (Non-Medical): No  Physical Activity: Not on file  Stress: Not on file  Social Connections: Not on file    Allergies: No Known Allergies  Metabolic Disorder Labs: Lab Results  Component Value Date   HGBA1C 5.4 08/01/2018   Lab Results  Component Value Date   PROLACTIN 8.1 08/01/2018   Lab Results  Component Value Date   CHOL 191 11/07/2020   TRIG 98 11/07/2020   HDL 54 11/07/2020   CHOLHDL 3.5 11/07/2020   VLDL 20 11/07/2020   LDLCALC 117 (H) 11/07/2020   LDLCALC 159 (H) 08/01/2018   Lab Results  Component Value Date  TSH 2.522 11/07/2020   TSH 2.830 08/10/2018    Therapeutic Level Labs: No results found for: LITHIUM No results found for: VALPROATE No components found for:  CBMZ  Current Medications: Current Outpatient Medications  Medication Sig Dispense Refill   acetaminophen (TYLENOL) 500 MG tablet Take 500 mg by mouth every 6 (six) hours as needed.     bisoprolol-hydrochlorothiazide (ZIAC) 5-6.25 MG tablet TAKE 1 TABLET BY MOUTH DAILY 30 tablet 3   Cholecalciferol (VITAMIN D3) 5000 units TABS Take 5,000 Units by mouth daily.     cyclobenzaprine (FLEXERIL) 10 MG tablet TAKE 1 TABLET BY MOUTH AT BEDTIME FOR BACK SPASMS AS NEEDED ONLY. 30 tablet 3   estradiol (ESTRACE) 0.1 MG/GM vaginal cream Use once a week as needed 42.5 g 1   fluticasone (FLONASE) 50 MCG/ACT nasal spray 1 SPRAY IN EACH NOSTRIL ONCE DAILY AS NEEDED 16 g 1   furosemide (LASIX) 20 MG tablet TAKE ONE TABLET BY MOUTH EVERY DAY 30 tablet 3   Liniments (SALONPAS PAIN RELIEF PATCH EX) Apply 1 patch topically 2 (two) times daily.     lubiprostone (AMITIZA) 8 MCG capsule Take one tab po qd for constipation 30 capsule 3   meloxicam (MOBIC) 7.5 MG tablet TAKE 1 TABLET BY MOUTH DAILY 30 tablet 2   Multiple Vitamins-Minerals (CENTRUM SILVER 50+WOMEN) TABS Take by mouth.     Polyethyl Glycol-Propyl Glycol (SYSTANE) 0.4-0.3 % SOLN Apply to eye.      polyethylene glycol powder (GLYCOLAX/MIRALAX) 17 GM/SCOOP powder Take 17 g by mouth daily as needed for mild constipation or moderate constipation. 3350 g 1   rOPINIRole (REQUIP) 1 MG tablet TAKE 1 TABLET BY MOUTH THREE TIMES DAILYAS NEEDED FOR RESTLESS LEGS 90 tablet 3   traMADol (ULTRAM) 50 MG tablet TAKE ONE TABLET TWICE A DAY IF NEEDED FOR SEVERE PAIN 60 tablet 1   traZODone (DESYREL) 50 MG tablet TAKE 1/2 TO 1 TABLET BY MOUTH AT BEDTIMEAS DIRECTED, STOP TAKING REMERON (MIRTAZAPINE) 30 tablet 2   ondansetron (ZOFRAN) 4 MG tablet Take 1 tablet (4 mg total) by mouth every 8 (eight) hours as needed for nausea or vomiting. (Patient not taking: Reported on 06/11/2021) 20 tablet 0   oxybutynin (DITROPAN-XL) 10 MG 24 hr tablet Take 1 tablet (10 mg total) by mouth daily. (Patient not taking: Reported on 06/11/2021) 30 tablet 2   promethazine (PHENERGAN) 25 MG tablet Take 25 mg by mouth as needed.  (Patient not taking: Reported on 06/11/2021)     simethicone (MYLICON) 80 MG chewable tablet Chew 80 mg by mouth every 6 (six) hours as needed for flatulence. (Patient not taking: Reported on 06/11/2021)     sulfamethoxazole-trimethoprim (BACTRIM) 400-80 MG tablet Take 1 tablet by mouth 2 (two) times daily. (Patient not taking: Reported on 06/11/2021) 20 tablet 0   No current facility-administered medications for this visit.     Musculoskeletal: Strength & Muscle Tone: within normal limits Gait & Station:  Walks with cane Patient leans: N/A  Psychiatric Specialty Exam: Review of Systems  Psychiatric/Behavioral:  The patient is nervous/anxious.   All other systems reviewed and are negative.  Blood pressure (!) 187/79, pulse 68, temperature 98.5 F (36.9 C), temperature source Temporal, weight 127 lb 9.6 oz (57.9 kg).Body mass index is 22.6 kg/m.  General Appearance: Casual  Eye Contact:  Fair  Speech:  Clear and Coherent  Volume:  Normal  Mood:  Anxious  Affect:  Appropriate  Thought Process:   Goal Directed and Descriptions of Associations: Intact  Orientation:  Full (Time, Place, and Person)  Thought Content: Logical   Suicidal Thoughts:  No  Homicidal Thoughts:  No  Memory:  Immediate;   Fair Recent;   Fair Remote;   Limited  Judgement:  Fair  Insight:  Fair  Psychomotor Activity:  Normal  Concentration:  Concentration: Fair and Attention Span: Fair  Recall:  [employer] of Knowledge: Fair  Language: Fair  Akathisia:  No  Handed:  Left  AIMS (if indicated): done, 0  Assets:  Communication Skills Desire for Improvement Housing Social Support Transportation  ADL's:  Intact  Cognition: WNL  Sleep:  Fair   Screenings: Personnel officer Visit from 03/10/2021 in [facility], [place] from 03/07/2020 in [facility], [place] from 03/06/2019 in [facility], [place] from 03/02/2018 in [facility], [facility]  Total Score (max 30 points ) 29 30 30 27       PHQ2-9    [place] Visit from 06/11/2021 in [place] Visit from 03[facility]24/2021 in [facility] Office Visit from 03/10/2021 in [facility], [place] from 02/24/2021 in [facility], [place] from 01/06/2021 in [facility], [facility]  PHQ-2 Total Score 0 0 0 0 0      [place] [facility] from 12/05/2020 in [place] [facility] from 11/04/2020 in [place] [facility] Visit from 08/29/2020 in [place] No Risk No Risk No Risk        Assessment and Plan: JAKYIA GACCIONE is a 87 year old female, has a history of GAD, MDD, history of psychosis, essential hypertension, chronic pain, OSA, hearing loss, bereavement was evaluated in office today.  Patient is currently stable however does report  short-term memory loss as well as abnormal involuntary movements of her lower extremities unknown if this is restless leg symptoms.  Patient will benefit from neurological evaluation, will refer for the same.  Plan GAD-stable We will monitor closely  Insomnia-stable Trazodone 25-50 mg p.o. nightly as needed  MDD in remission We will monitor closely  Memory problems-unstable MMSE completed-29 out of 30. Patient however reports she has been struggling with short-term memory loss which does have an impact on her functioning. TSH-11/07/2020-within normal limits Vitamin B12-11/07/2020-371-patient was advised at that time to follow up with primary care provider for replacement as needed. Will refer patient for neurological evaluation based on patient preference.  Abnormal involuntary movement-unstable Unknown if this is due to her restless leg symptoms.  Did not observe any abnormal involuntary movements in session today. We will refer her for neurological evaluation.  Elevated blood pressure reading-unstable Patient reports she has been monitoring her blood pressure and manage that has been stable.  Patient to follow up with primary care provider if it continues to remain high.  Follow-up in clinic in 3 months or sooner in person.  This note was generated in part or whole with voice recognition software. Voice recognition is usually quite accurate but there are transcription errors that can and very often do occur. I apologize for any typographical errors that were not detected and corrected.     Ursula Alert, MD 06/12/2021, 10:59 AM

## 2021-06-12 ENCOUNTER — Telehealth: Payer: Self-pay

## 2021-06-13 NOTE — Telephone Encounter (Signed)
LMOM for pt to reach out to Dr. Charlcie Cradle office but if they are unable to help pt then she can call us back for an acute appt

## 2021-06-14 ENCOUNTER — Other Ambulatory Visit: Payer: Self-pay | Admitting: Physician Assistant

## 2021-06-14 DIAGNOSIS — R110 Nausea: Secondary | ICD-10-CM

## 2021-06-17 ENCOUNTER — Other Ambulatory Visit: Payer: Self-pay | Admitting: Nurse Practitioner

## 2021-06-17 DIAGNOSIS — K5900 Constipation, unspecified: Secondary | ICD-10-CM

## 2021-06-18 NOTE — Telephone Encounter (Signed)
LMOM to make sure pt was able to speak with Dr. Charlcie Cradle office

## 2021-06-23 ENCOUNTER — Other Ambulatory Visit: Payer: Self-pay

## 2021-06-23 ENCOUNTER — Ambulatory Visit (INDEPENDENT_AMBULATORY_CARE_PROVIDER_SITE_OTHER): Payer: Medicare HMO | Admitting: Nurse Practitioner

## 2021-06-23 ENCOUNTER — Encounter: Payer: Self-pay | Admitting: Nurse Practitioner

## 2021-06-23 VITALS — BP 170/90 | HR 72 | Temp 98.2°F | Resp 16 | Ht 65.0 in | Wt 130.4 lb

## 2021-06-23 DIAGNOSIS — I10 Essential (primary) hypertension: Secondary | ICD-10-CM | POA: Diagnosis not present

## 2021-06-23 DIAGNOSIS — M545 Low back pain, unspecified: Secondary | ICD-10-CM

## 2021-06-23 DIAGNOSIS — G8929 Other chronic pain: Secondary | ICD-10-CM | POA: Diagnosis not present

## 2021-06-23 DIAGNOSIS — G2581 Restless legs syndrome: Secondary | ICD-10-CM | POA: Diagnosis not present

## 2021-06-23 MED ORDER — BISOPROLOL-HYDROCHLOROTHIAZIDE 5-6.25 MG PO TABS: 1.0000 | ORAL_TABLET | Freq: Every day | ORAL | 3 refills | Status: DC

## 2021-06-23 MED ORDER — TRAMADOL HCL 50 MG PO TABS: ORAL_TABLET | ORAL | 1 refills | Status: DC

## 2021-06-23 MED ORDER — OXYBUTYNIN CHLORIDE ER 10 MG PO TB24: 10.0000 mg | ORAL_TABLET | Freq: Every day | ORAL | 2 refills | Status: DC

## 2021-06-23 MED ORDER — ROPINIROLE HCL 1 MG PO TABS: 1.0000 mg | ORAL_TABLET | Freq: Three times a day (TID) | ORAL | 5 refills | Status: DC | PRN

## 2021-06-23 NOTE — Progress Notes (Signed)
[facility] [place], [place] [address]  Internal MEDICINE  Office Visit Note  Patient Name: Kristen Pennington  [id number]  [id number]  Date of Service: 06/23/2021  Chief Complaint  Patient presents with   Hypertension    3 month    Hypertension Pertinent negatives include no chest pain, neck pain, palpitations or shortness of breath.  Kristen Pennington presents for a follow-up visit for hypertension and medication refills.  Her blood pressure is significantly elevated today she reports that she has been rushing around taking a friend to the hospital and then rushing to get here for her appointment today.  She also reports feeling upset when dealing with their landlord and that she is all "keyed up" right now.  She is still upset about her eyesight as she has macular degeneration and her eyesight has been poor and she is unable to drive.  She reports visiting her daughter for Thanksgiving recently and she is going to be visiting her daughter in [place] again for Christmas.     Current Medication: Outpatient Encounter Medications as of 06/23/2021  Medication Sig   acetaminophen (TYLENOL) 500 MG tablet Take 500 mg by mouth every 6 (six) hours as needed.   Cholecalciferol (VITAMIN D3) 5000 units TABS Take 5,000 Units by mouth daily.   cyclobenzaprine (FLEXERIL) 10 MG tablet TAKE 1 TABLET BY MOUTH AT BEDTIME FOR BACK SPASMS AS NEEDED ONLY.   estradiol (ESTRACE) 0.1 MG/GM vaginal cream Use once a week as needed   fluticasone (FLONASE) 50 MCG/ACT nasal spray 1 SPRAY IN EACH NOSTRIL ONCE DAILY AS NEEDED   furosemide (LASIX) 20 MG tablet TAKE ONE TABLET BY MOUTH EVERY DAY   Liniments (SALONPAS PAIN RELIEF PATCH EX) Apply 1 patch topically 2 (two) times daily.   lubiprostone (AMITIZA) 8 MCG capsule Take one tab po qd for constipation   meloxicam (MOBIC) 7.5 MG tablet TAKE 1 TABLET BY MOUTH DAILY   Multiple Vitamins-Minerals (CENTRUM SILVER 50+WOMEN) TABS Take by mouth.    ondansetron (ZOFRAN) 4 MG tablet TAKE ONE TABLET BY MOUTH EVERY 8 HOURS AS NEEDED FOR NAUSEA AND VOMITING   Polyethyl Glycol-Propyl Glycol (SYSTANE) 0.4-0.3 % SOLN Apply to eye.   polyethylene glycol powder (GLYCOLAX/MIRALAX) 17 GM/SCOOP powder TAKE 17 G BY MOUTH DAILY AS NEEDED FOR MILD CONSTIPATION OR MODERATE CONSTIPATION.   promethazine (PHENERGAN) 25 MG tablet Take 25 mg by mouth as needed.   simethicone (MYLICON) 80 MG chewable tablet Chew 80 mg by mouth every 6 (six) hours as needed for flatulence.   traZODone (DESYREL) 50 MG tablet TAKE 1/2 TO 1 TABLET BY MOUTH AT BEDTIMEAS DIRECTED, STOP TAKING REMERON (MIRTAZAPINE)   [DISCONTINUED] bisoprolol-hydrochlorothiazide (ZIAC) 5-6.25 MG tablet TAKE 1 TABLET BY MOUTH DAILY   [DISCONTINUED] oxybutynin (DITROPAN-XL) 10 MG 24 hr tablet Take 1 tablet (10 mg total) by mouth daily.   [DISCONTINUED] rOPINIRole (REQUIP) 1 MG tablet TAKE 1 TABLET BY MOUTH THREE TIMES DAILYAS NEEDED FOR RESTLESS LEGS   [DISCONTINUED] sulfamethoxazole-trimethoprim (BACTRIM) 400-80 MG tablet Take 1 tablet by mouth 2 (two) times daily.   [DISCONTINUED] traMADol (ULTRAM) 50 MG tablet TAKE ONE TABLET TWICE A DAY IF NEEDED FOR SEVERE PAIN   bisoprolol-hydrochlorothiazide (ZIAC) 5-6.25 MG tablet Take 1 tablet by mouth daily.   oxybutynin (DITROPAN-XL) 10 MG 24 hr tablet Take 1 tablet (10 mg total) by mouth daily.   rOPINIRole (REQUIP) 1 MG tablet Take 1 tablet (1 mg total) by mouth 3 (three) times daily as needed (restless legs).  traMADol (ULTRAM) 50 MG tablet TAKE ONE TABLET TWICE A DAY IF NEEDED FOR SEVERE PAIN   No facility-administered encounter medications on file as of 06/23/2021.    Surgical History: Past Surgical History:  Procedure Laterality Date   COLONOSCOPY  05-03-15   COLONOSCOPY WITH PROPOFOL N/A 05/03/2015   Procedure: COLONOSCOPY WITH PROPOFOL;  Surgeon: Lucilla Lame, MD;  Location: [facility];  Service: Endoscopy;  Laterality: N/A;  CPAP    DILATION AND CURETTAGE OF UTERUS  1970   ENDOMETRIAL BIOPSY     EYE SURGERY Right 2017   POLYPECTOMY  05/03/2015   Procedure: POLYPECTOMY;  Surgeon: Lucilla Lame, MD;  Location: [facility];  Service: Endoscopy;;   ROTATOR CUFF REPAIR  2007   SPINE SURGERY      Medical History: Past Medical History:  Diagnosis Date   Arthritis    Atrophic vaginitis 03/06/2013   Last Assessment & Plan:  She will plan to continue estradiol vaginal cream.  Prescription was rewritten stipulating use of the generic product.    Atypical migraine 03/06/2013   Last Assessment & Plan:  Since she rarely takes sumatriptan, we will discontinue it. Continue sparing use of OTC migraine products.    Avitaminosis D 03/06/2013   Last Assessment & Plan:  Recheck vitamin D level. Plan to continue vitamin D supplementation.    Ceratitis 08/01/2013   Last Assessment & Plan:  Because of the expense, she will discontinue Restasis. She will use over-the-counter moisturizing eyedrops and liquid gel.    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    Cystocele, midline 03/06/2013   Demoralization and apathy 03/06/2013   Last Assessment & Plan:  Since she has been taking bupropion only once a day, I will write the prescription with the correct instructions and correct quantity. She has done well on this for years and she is encouraged to take it regularly    Depression    Environmental allergies    Epistaxis    Essential (primary) hypertension 05/17/2013   Last Assessment & Plan:  Her blood pressure is well-controlled. We will plan to continue hydrochlorothiazide and benazepril.  Both are generic and should be affordable on her health plan.    GERD (gastroesophageal reflux disease)     Hemorrhoid    Inflammation of sacroiliac joint ([place]) 03/06/2013   Last Assessment & Plan:  She is doing well on her present regimen of fentanyl patch supplemented with sparing use of tramadol/acetaminophen. We will continue that regimen.    Sinusitis    Sleep apnea    CPAP    Family History: Family History  Problem Relation Age of Onset   Arthritis Mother    Alzheimer's disease Father    Heart disease Sister    Cancer Brother    Ovarian cancer Neg Hx    Breast cancer Neg Hx    Colon cancer Neg Hx    Diabetes Neg Hx     Social History   Socioeconomic History   Marital status: Widowed    Spouse name: Not on file   Number of children: 2   Years of education: Not on file   Highest education level: Some college, no degree  Occupational History   Not on file  Tobacco  Use   Smoking status: Never   Smokeless tobacco: Never  Vaping Use   Vaping Use: Never used  Substance and Sexual Activity   Alcohol use: No    Alcohol/week: 0.0 standard drinks   Drug use: No   Sexual activity: Not Currently  Other Topics Concern   Not on file  Social History Narrative   Not on file   Social Determinants of Health   Financial Resource Strain: Not on file  Food Insecurity: No Food Insecurity   Worried About Running Out of Food in the Last Year: Never true   Ran Out of Food in the Last Year: Never true  Transportation Needs: No Transportation Needs   Lack of Transportation (Medical): No   Lack of Transportation (Non-Medical): No  Physical Activity: Not on file  Stress: Not on file  Social Connections: Not on file  Intimate Partner Violence: Not on file      Review of Systems  Constitutional:  Negative for chills, fatigue and unexpected weight change.  HENT:  Negative for congestion, rhinorrhea, sneezing and sore throat.   Eyes:  Negative for redness.  Respiratory:  Negative for cough, chest tightness and shortness of breath.   Cardiovascular:  Negative for chest pain and  palpitations.  Gastrointestinal:  Negative for abdominal pain, constipation, diarrhea, nausea and vomiting.  Genitourinary:  Negative for dysuria and frequency.  Musculoskeletal:  Negative for arthralgias, back pain, joint swelling and neck pain.  Skin:  Negative for rash.  Neurological: Negative.  Negative for tremors and numbness.  Hematological:  Negative for adenopathy. Does not bruise/bleed easily.  Psychiatric/Behavioral:  Negative for behavioral problems (Depression), sleep disturbance and suicidal ideas. The patient is not nervous/anxious.    Vital Signs: BP (!) 179/84   Pulse 72   Temp 98.2 F (36.8 C)   Resp 16   Ht 5\' 5"  (1.651 m)   Wt 130 lb 6.4 oz (59.1 kg)   SpO2 95%   BMI 21.70 kg/m    Physical Exam Vitals reviewed.  Constitutional:      General: She is not in acute distress.    Appearance: Normal appearance. She is normal weight. She is not ill-appearing.  HENT:     Head: Normocephalic and atraumatic.  Eyes:     Pupils: Pupils are equal, round, and reactive to light.  Cardiovascular:     Rate and Rhythm: Normal rate and regular rhythm.  Pulmonary:     Effort: Pulmonary effort is normal. No respiratory distress.  Neurological:     Mental Status: She is alert and oriented to person, place, and time.     Cranial Nerves: No cranial nerve deficit.     Coordination: Coordination normal.     Gait: Gait normal.  Psychiatric:        Mood and Affect: Mood normal.        Behavior: Behavior normal.       Assessment/Plan: 1. Essential (primary) hypertension Patient reluctant to change any BP medication due to increased level of stress recently. Patient agreed to follow up in 4 weeks to reevaluate blood pressure.  - bisoprolol-hydrochlorothiazide (ZIAC) 5-6.25 MG tablet; Take 1 tablet by mouth daily.  Dispense: 30 tablet; Refill: 3  2. Chronic bilateral low back pain without sciatica Refills ordered - traMADol (ULTRAM) 50 MG tablet; TAKE ONE TABLET TWICE A DAY  IF NEEDED FOR SEVERE PAIN  Dispense: 60 tablet; Refill: 1  3. Restless leg syndrome Continues to take 2 tablets at night and 1 tablet  in the morning. Continue as discussed.  - rOPINIRole (REQUIP) 1 MG tablet; Take 1 tablet (1 mg total) by mouth 3 (three) times daily as needed (restless legs).  Dispense: 90 tablet; Refill: 5   General Counseling: kenlie seki understanding of the findings of todays visit and agrees with plan of treatment. I have discussed any further diagnostic evaluation that may be needed or ordered today. We also reviewed her medications today. she has been encouraged to call the office with any questions or concerns that should arise related to todays visit.    No orders of the defined types were placed in this encounter.   Meds ordered this encounter  Medications   rOPINIRole (REQUIP) 1 MG tablet    Sig: Take 1 tablet (1 mg total) by mouth 3 (three) times daily as needed (restless legs).    Dispense:  90 tablet    Refill:  5   oxybutynin (DITROPAN-XL) 10 MG 24 hr tablet    Sig: Take 1 tablet (10 mg total) by mouth daily.    Dispense:  30 tablet    Refill:  2   bisoprolol-hydrochlorothiazide (ZIAC) 5-6.25 MG tablet    Sig: Take 1 tablet by mouth daily.    Dispense:  30 tablet    Refill:  3   traMADol (ULTRAM) 50 MG tablet    Sig: TAKE ONE TABLET TWICE A DAY IF NEEDED FOR SEVERE PAIN    Dispense:  60 tablet    Refill:  1    FOR FUTURE REFILLS IF ALLOWED    Return in about 4 weeks (around 07/21/2021) for F/U, BP check, Nickoli Bagheri PCP.   Total time spent:30 Minutes Time spent includes review of chart, medications, test results, and follow up plan with the patient.   [place] Controlled Substance Database was reviewed by me.  This patient was seen by Jonetta Osgood, FNP-C in collaboration with Dr. Clayborn Bigness as a part of collaborative care agreement.   Emalia Witkop R. Valetta Fuller, MSN, FNP-C Internal medicine

## 2021-06-26 IMAGING — CT CT HEAD W/O CM
3 series · 16 of 47 positions shown, 19 images · non-contrast
Comparison: Prior head CT examinations 09/01/2017 and are

CLINICAL DATA: Head trauma, minor.

EXAM:
CT HEAD WITHOUT CONTRAST
TECHNIQUE: Contiguous axial images were obtained from the base of the skull
through the vertex without intravenous contrast.

[Series 2: head wo · axial · 0.42mm/px · z∈[-194,-54]mm · 10 of 34 slices shown, 13 images]
[im 3/34  brain]
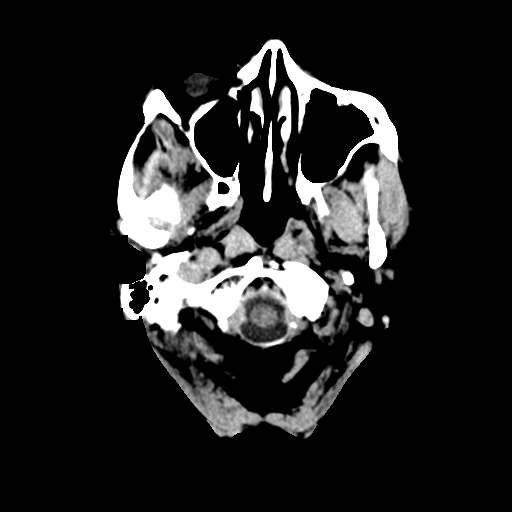
[im 3/34  bone]
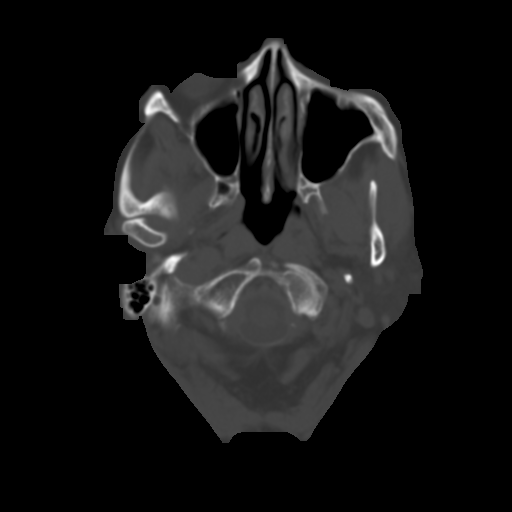
[im 6/34  brain]
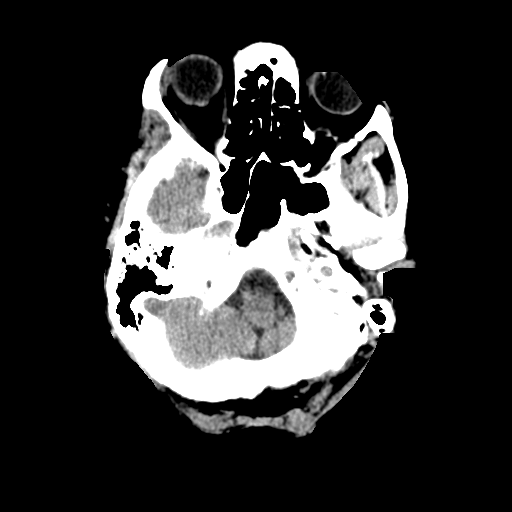
[im 10/34  brain]
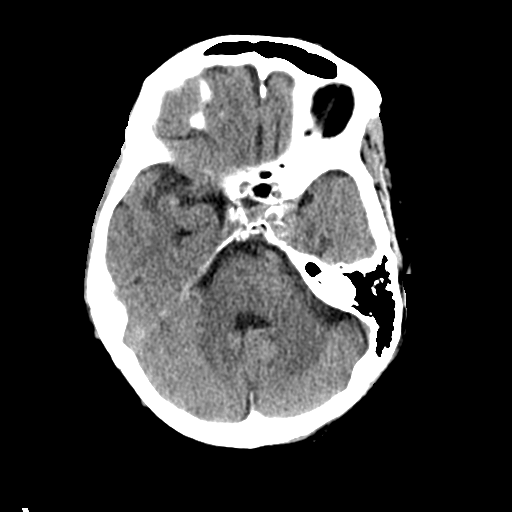
[im 12/34  brain]
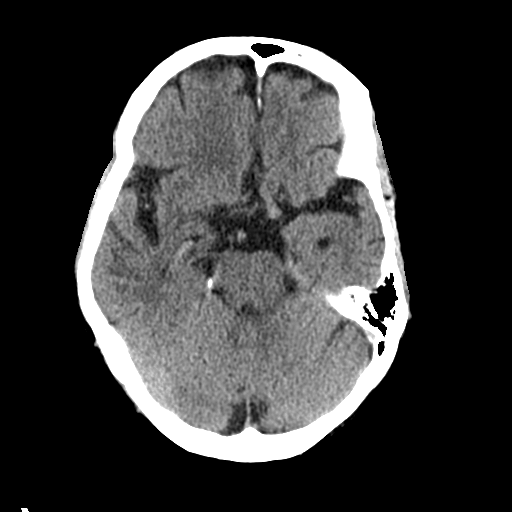
[im 15/34  brain]
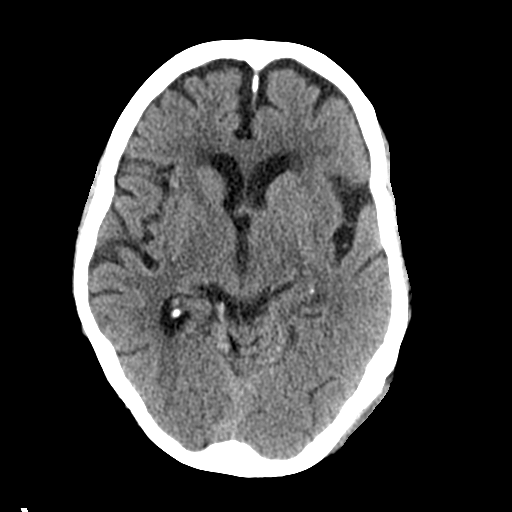
[im 15/34  bone]
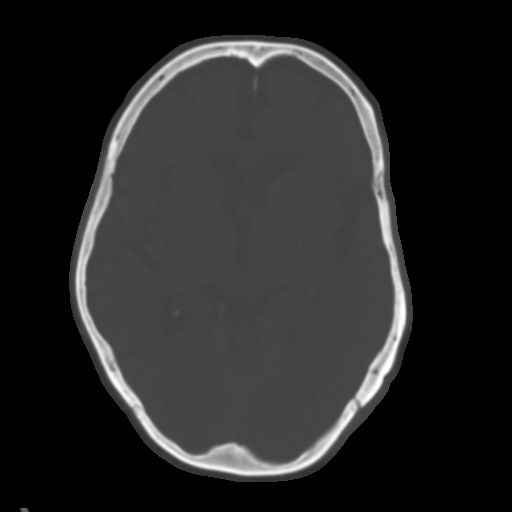
[im 19/34  brain]
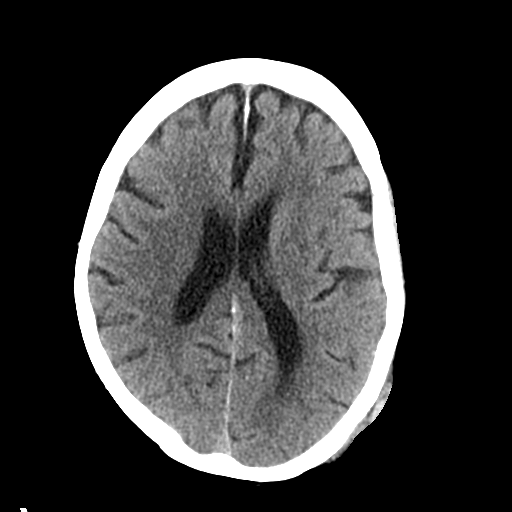
[im 22/34  brain]
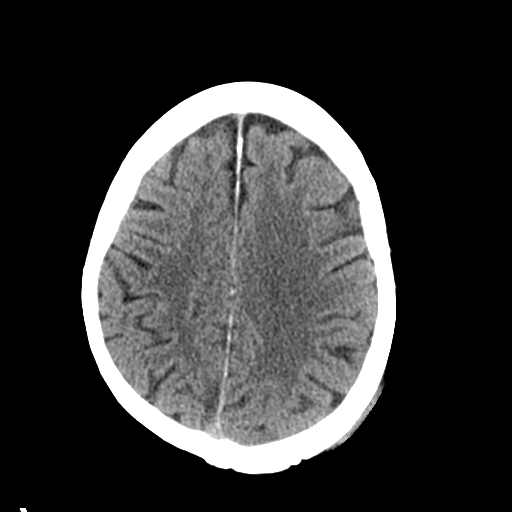
[im 26/34  brain]
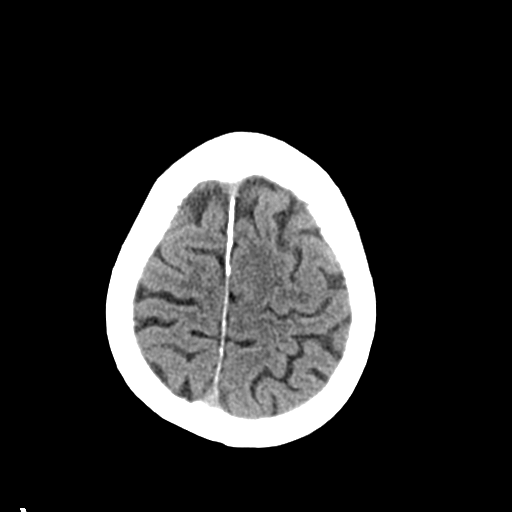
[im 28/34  brain]
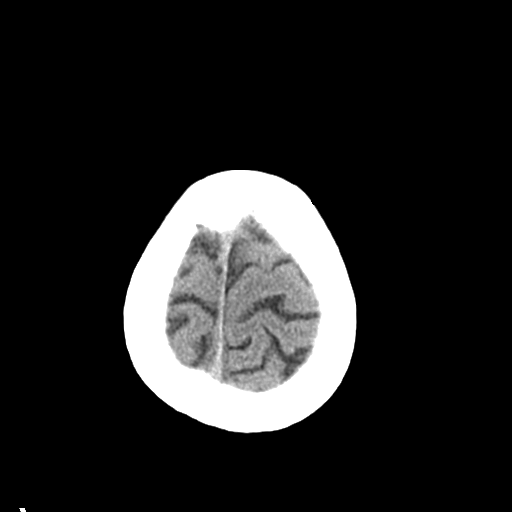
[im 28/34  bone]
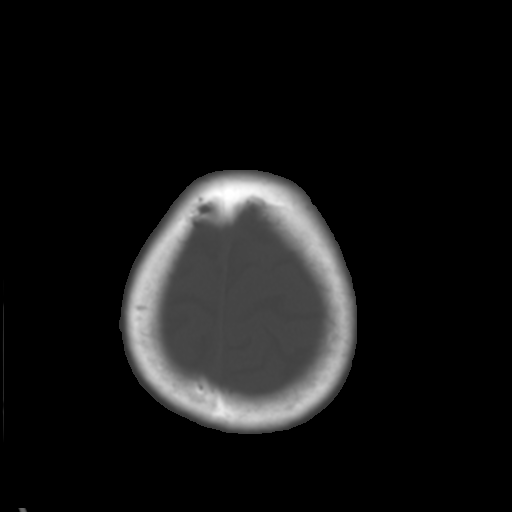
[im 31/34  brain]
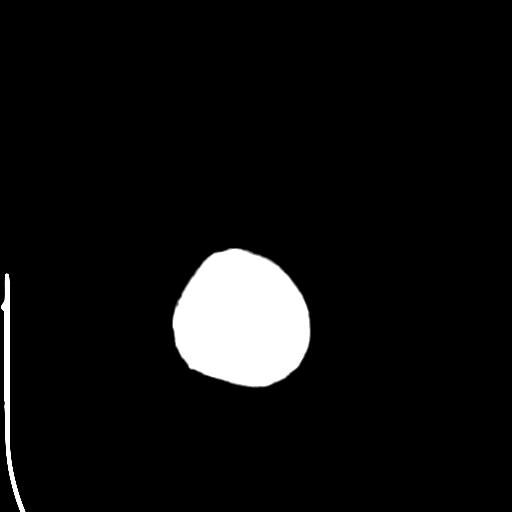

[Series 4: coronal soft tissue · coronal · 0.34mm/px · 3 of 69 slices shown]
[im 23/69  brain]
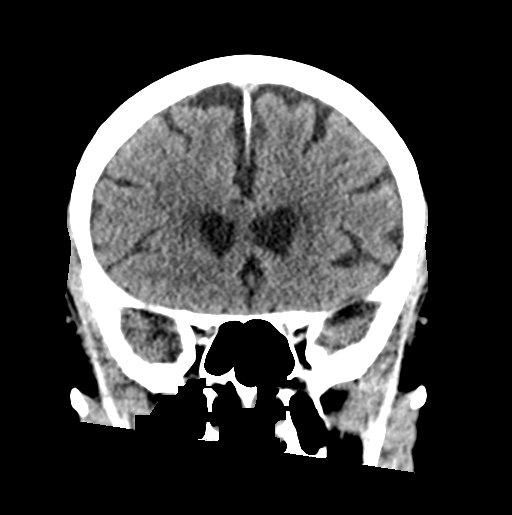
[im 31/69  brain]
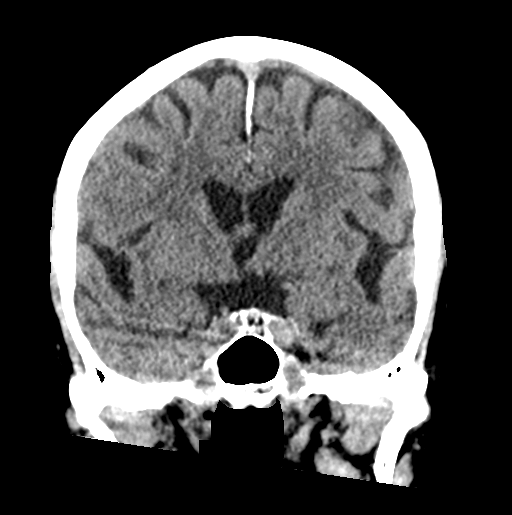
[im 38/69  brain]
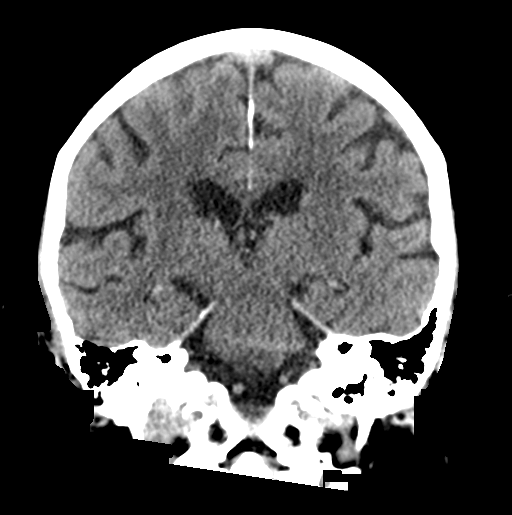

[Series 5: sagittal soft tissue · sagittal · 0.35mm/px · 3 of 59 slices shown]
[im 21/59  brain]
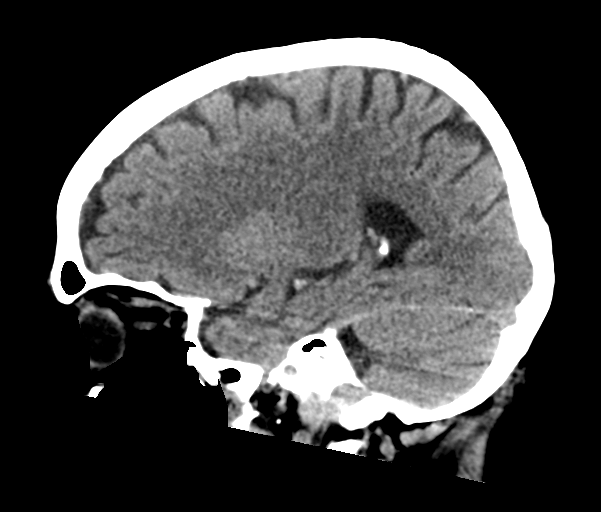
[im 30/59  brain]
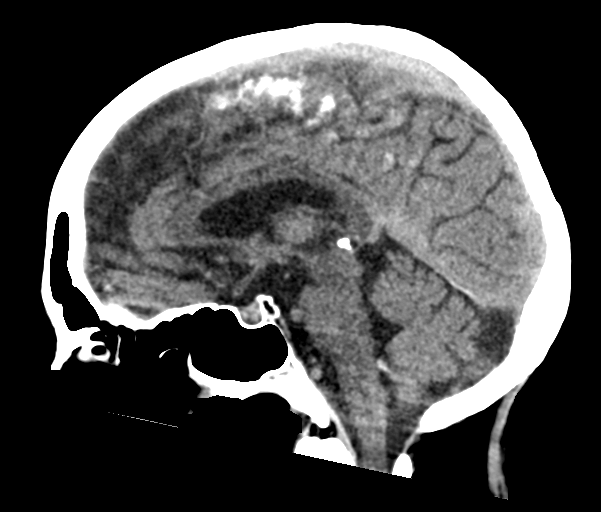
[im 38/59  brain]
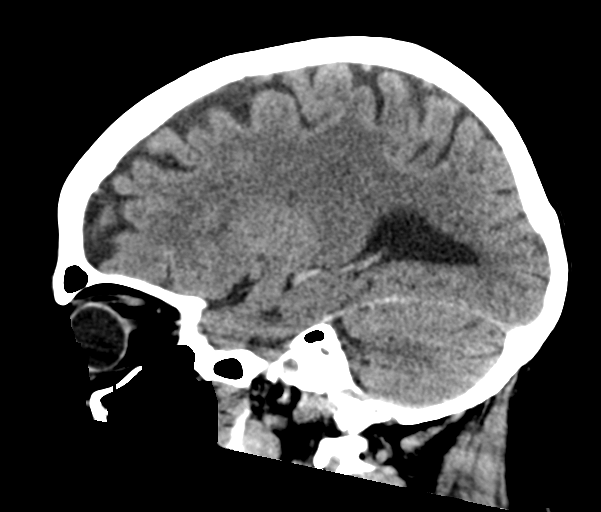

[16 of 47 positions shown; findings below may reference images not displayed]

FINDINGS: Brain:

Mild cerebral atrophy.

There is no acute intracranial hemorrhage.

No demarcated cortical infarct.

No extra-axial fluid collection.

No evidence of intracranial mass.

No midline shift.

Vascular: No hyperdense vessel.  Atherosclerotic calcifications.

Skull: Normal. Negative for fracture or focal lesion.

Sinuses/Orbits: Visualized orbits show no acute finding. No
significant paranasal sinus disease at the imaged levels.

Other: Left parietal scalp hematoma.
IMPRESSION: No evidence of acute intracranial abnormality.

Left parietal scalp hematoma.

Mild cerebral atrophy.

## 2021-07-01 ENCOUNTER — Other Ambulatory Visit: Payer: Self-pay | Admitting: *Deleted

## 2021-07-01 NOTE — Patient Outreach (Signed)
[facility]) Care Management  07/01/2021  Kristen Pennington [birth year] [id number]   Telephone Assessment-Unsuccessful  RN attempted outreach call today however unsuccessful. RN able to leave a HIPAA approved voice message requesting a call back.  Will continue outreach call with another call over the next week for pending services.  [name], RN Care Management Coordinator [place] Office [phone]

## 2021-07-09 ENCOUNTER — Other Ambulatory Visit: Payer: Self-pay | Admitting: *Deleted

## 2021-07-09 NOTE — Patient Outreach (Signed)
[facility] [facility]) Care Management  07/09/2021  SOUMYA COLSON 1929-04-25 [id number]   Telephone Assessment-Unsuccessful  RN attempted outreach call today however unsuccessful. RN able to leave a HIPAA approved voice message requesting a call back.  Will send outreach letter and attempt another outreach call over the next week.  [name], RN Care Management Coordinator [place] Office [phone]

## 2021-07-10 ENCOUNTER — Other Ambulatory Visit: Payer: Self-pay | Admitting: *Deleted

## 2021-07-15 ENCOUNTER — Other Ambulatory Visit: Payer: Self-pay | Admitting: *Deleted

## 2021-07-15 NOTE — Patient Outreach (Signed)
[place] [facility]) Care Management  07/15/2021  AVANTHIKA DEHNERT [birth year] [id number]   Telephone Assessment-Successful  RN spoke with pt today and updated the plan of care with much discussion on pt's chronic constipation. Pt states some ongoing issues but she will be more complaint with taking her Miralax and Amitiza . States she has not been taking on a regular but will start now that she is aware of the risk of constipation related to perforation of bowels. Encouraged sufficient hydration for assist with her ongoing constipation. No other issues as pt continue to be on tack with the discussed plan of care.  Pt continue to request the quarterly follow up calls as RN will follow up on April as discussed today with possible transfer to a Health Coach with [facility] for ongoing disease management services.  Goals Addressed             This Visit's Progress    THN-Matintain My Quality of Life   On track    Follow Up Date: 10/30/2021 Timeframe:  Long-Range Goal Priority:  Medium Start Date:   01/02/2021                          Expected End Date:11/07/2021                      - discuss my treatment options with the doctor or nurse - do one enjoyable thing every day - learn something new by asking, reading and searching the Internet every day - make shared treatment decisions with doctor - strengthen or fix relationships with loved ones  Barriers: Health Behaviors   Patient's preferred language:   Why is this important?  Having a long-term illness can be scary.  It can also be stressful for you and your caregiver.  These steps may help.    Notes:  07/15/21-Pt reports she is doing better however continues to have some ongoing stressors but much improved. Pt alittle stressed with use of a new phone. RN assisted with phoning pt and changing the number in Epic to make sure the phone was working. No other issues to addressed\ at this time. 9/20-Will stress pt to continue to work  on improving her relations with her daughter. Encourage pt to try to enjoy outings and visiting her neighbors. 6/23: Discuss and review all pt's barriers and issues. Strongly encourage pt to document all her medical issues and discuss with her provider on her upcoming appointment. Also discussed issues concerning her relationship with her children and encouraged pt to have a discussion with both concerning what has expectations are with both involved with her care.      THN-Track and Manage My Blood Pressure-Hypertension   On track    Follow Up Date: 10/30/2021 Requesting  Quarterly follow up Timeframe:  Short-Term Goal Priority:  Medium Start Date:  01/02/2021                        Expected End Date:  11/07/2021                 - check blood pressure 3 times per week - choose a place to take my blood pressure (home, clinic or office, retail store) - write blood pressure results in a log or diary   Barriers: Health Behaviors Knowledge  Why is this important?   You won't feel high blood pressure, but it can still hurt  your blood vessels.  High blood pressure can cause heart or kidney problems. It can also cause a stroke.  Making lifestyle changes like losing a little weight or eating less salt will help.  Checking your blood pressure at home and at different times of the day can help to control blood pressure.  If the doctor prescribes medicine remember to take it the way the doctor ordered.  Call the office if you cannot afford the medicine or if there are questions about it.     Notes: 07/15/2021- Pt reports her blood pressures continues to be a her baseline readings. No reported issues at this time. Pt continue to monitor her ongoing bp with home device. 9/20-Pt report 118/75 and 132/68 with no acute symptoms. Pt continue to take her blood pressures daily and aware of who to call with any symptoms or elevations of her bp Dr. Humphrey Rolls. 8/24- Pt reports her bp has been in her baseline reading  120/50-60's with daily monitoring. Support system remains in place. 7/22-Pt continues to do well with no acute issues. Pt continues to have supportive neighbors and a daughter in [place]. Reports 117-144/76 with no acute issues. Pt states the facility has isolated some of the residents due to restrictions at the ALF [facility]). No acute issues reported over the last month. 6/23: Pt states she will agree to take her blood pressure 3 X a week. Encouraged pt to document in th [place] for her providers to view. Pt aware of possible sign/ symptoms and what to do acute with her blood pressure. Pt current at a senior living facility has has nearby neighbors for support along with a daughter and son however out of town.          [name], RN Care Management Coordinator [place] Office [phone]

## 2021-07-16 ENCOUNTER — Other Ambulatory Visit: Payer: Self-pay | Admitting: Internal Medicine

## 2021-07-23 ENCOUNTER — Ambulatory Visit: Payer: Medicare HMO | Admitting: Nurse Practitioner

## 2021-07-23 DIAGNOSIS — Z0289 Encounter for other administrative examinations: Secondary | ICD-10-CM

## 2021-07-25 ENCOUNTER — Telehealth: Payer: Self-pay

## 2021-07-25 NOTE — Telephone Encounter (Signed)
Left patient a voicemail to reschedule missed office visit from 07-23-21.

## 2021-08-04 ENCOUNTER — Telehealth: Payer: Self-pay

## 2021-08-04 ENCOUNTER — Encounter: Payer: Self-pay | Admitting: Nurse Practitioner

## 2021-08-04 ENCOUNTER — Ambulatory Visit (INDEPENDENT_AMBULATORY_CARE_PROVIDER_SITE_OTHER): Payer: Medicare HMO | Admitting: Nurse Practitioner

## 2021-08-04 ENCOUNTER — Other Ambulatory Visit: Payer: Self-pay

## 2021-08-04 VITALS — BP 140/90 | HR 60 | Temp 98.3°F | Resp 16 | Ht 60.0 in | Wt 127.4 lb

## 2021-08-04 DIAGNOSIS — I10 Essential (primary) hypertension: Secondary | ICD-10-CM | POA: Diagnosis not present

## 2021-08-04 DIAGNOSIS — F411 Generalized anxiety disorder: Secondary | ICD-10-CM

## 2021-08-04 DIAGNOSIS — M545 Low back pain, unspecified: Secondary | ICD-10-CM

## 2021-08-04 DIAGNOSIS — G8929 Other chronic pain: Secondary | ICD-10-CM

## 2021-08-04 DIAGNOSIS — G4701 Insomnia due to medical condition: Secondary | ICD-10-CM | POA: Diagnosis not present

## 2021-08-04 DIAGNOSIS — E538 Deficiency of other specified B group vitamins: Secondary | ICD-10-CM | POA: Diagnosis not present

## 2021-08-04 MED ORDER — TRAMADOL HCL 50 MG PO TABS: ORAL_TABLET | ORAL | 1 refills | Status: DC

## 2021-08-04 MED ORDER — CYANOCOBALAMIN 1000 MCG/ML IJ SOLN
1000.0000 ug | Freq: Once | INTRAMUSCULAR | Status: AC
  Administered 2021-08-04: 1000 ug via INTRAMUSCULAR

## 2021-08-04 MED ORDER — TRAZODONE HCL 50 MG PO TABS: ORAL_TABLET | ORAL | 2 refills | Status: DC

## 2021-08-04 NOTE — Telephone Encounter (Signed)
Pain mgmt referral placed. Patient wants to wait until 09/08/21 to discuss again with provider-Toni
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
## 2021-08-06 NOTE — Telephone Encounter (Signed)
Pain mgmt referral sent via [facility] to [facility]

## 2021-08-14 ENCOUNTER — Other Ambulatory Visit: Payer: Self-pay | Admitting: Internal Medicine

## 2021-08-18 NOTE — Telephone Encounter (Signed)
Referral closed by [facility] due to patient not returning calls for appointment. I s/w patient. She stated she is confused today. She will call me back-Toni

## 2021-09-05 ENCOUNTER — Other Ambulatory Visit: Payer: Self-pay | Admitting: Psychiatry

## 2021-09-05 DIAGNOSIS — F411 Generalized anxiety disorder: Secondary | ICD-10-CM

## 2021-09-08 ENCOUNTER — Other Ambulatory Visit: Payer: Self-pay

## 2021-09-08 ENCOUNTER — Ambulatory Visit (INDEPENDENT_AMBULATORY_CARE_PROVIDER_SITE_OTHER): Payer: Medicare HMO | Admitting: Nurse Practitioner

## 2021-09-08 ENCOUNTER — Encounter: Payer: Self-pay | Admitting: Nurse Practitioner

## 2021-09-08 VITALS — BP 140/80 | HR 63 | Temp 98.3°F | Resp 16 | Ht 64.0 in | Wt 124.0 lb

## 2021-09-08 DIAGNOSIS — I10 Essential (primary) hypertension: Secondary | ICD-10-CM

## 2021-09-08 DIAGNOSIS — F3341 Major depressive disorder, recurrent, in partial remission: Secondary | ICD-10-CM | POA: Diagnosis not present

## 2021-09-08 DIAGNOSIS — R0982 Postnasal drip: Secondary | ICD-10-CM

## 2021-09-08 DIAGNOSIS — Z23 Encounter for immunization: Secondary | ICD-10-CM

## 2021-09-08 DIAGNOSIS — E875 Hyperkalemia: Secondary | ICD-10-CM | POA: Diagnosis not present

## 2021-09-08 MED ORDER — ZOSTER VAC RECOMB ADJUVANTED 50 MCG/0.5ML IM SUSR
0.5000 mL | Freq: Once | INTRAMUSCULAR | 0 refills | Status: AC
Start: 2021-09-08 — End: 2021-09-08

## 2021-09-08 MED ORDER — FLUTICASONE PROPIONATE 50 MCG/ACT NA SUSP: NASAL | 1 refills | Status: DC

## 2021-09-08 MED ORDER — ESCITALOPRAM OXALATE 5 MG PO TABS: 5.0000 mg | ORAL_TABLET | Freq: Every day | ORAL | 3 refills | Status: DC

## 2021-09-08 MED ORDER — BISOPROLOL-HYDROCHLOROTHIAZIDE 5-6.25 MG PO TABS: 1.0000 | ORAL_TABLET | Freq: Every day | ORAL | 3 refills | Status: DC

## 2021-09-08 MED ORDER — MONTELUKAST SODIUM 10 MG PO TABS: 10.0000 mg | ORAL_TABLET | Freq: Every day | ORAL | 3 refills | Status: DC

## 2021-09-08 NOTE — Patient Instructions (Signed)
Start taking escitalopram 5 mg daily in the morning  Start taking montelukast 10 mg daily at bedtime

## 2021-09-08 NOTE — Progress Notes (Signed)
[facility] [place], [place] [address]  Internal MEDICINE  Office Visit Note  Patient Name: Kristen Pennington  [id number]  [id number]  Date of Service: 09/08/2021  Chief Complaint  Patient presents with   Follow-up   Depression   Gastroesophageal Reflux   Hyperlipidemia   Hypertension    HPI Kristen Pennington presents for a follow-up visit for medication review and hypertension and depression.  She reports that she is dealing with increased stress related to inspections of her apartment in her senior living center.  She can no longer drive and has recently sold her car.  She is in the process of figuring out her transportation issue.  She is worried about her kidney function and wants to repeat her labs to have it checked.  She reports that she has been feeling more sad lately and overwhelmed and lonely.  She lives alone one of her friends who had to go to the hospital because he was sick last month left to the senior living center and is not coming back.  She is also missing her daughter and wanting her to come visit her more.  She is wondering if she should start taking Lexapro again.  She has stopped seeing Dr. Shea Evans due to issues with her offices billing department She brought her medications to her office visit today and all of her medications were reviewed in detail with the patient.   Current Medication: Outpatient Encounter Medications as of 09/08/2021  Medication Sig   acetaminophen (TYLENOL) 500 MG tablet Take 500 mg by mouth every 6 (six) hours as needed.   Cholecalciferol (VITAMIN D3) 5000 units TABS Take 5,000 Units by mouth daily.   cyclobenzaprine (FLEXERIL) 10 MG tablet TAKE 1 TABLET BY MOUTH AT BEDTIME FOR BACK SPASMS AS NEEDED ONLY.   escitalopram (LEXAPRO) 5 MG tablet Take 1 tablet (5 mg total) by mouth daily.   estradiol (ESTRACE) 0.1 MG/GM vaginal cream Use once a week as needed   furosemide (LASIX) 20 MG tablet TAKE ONE TABLET BY MOUTH EVERY DAY    Liniments (SALONPAS PAIN RELIEF PATCH EX) Apply 1 patch topically 2 (two) times daily.   lubiprostone (AMITIZA) 8 MCG capsule TAKE 1 CAPSULE BY MOUTH ONCE DAILY FOR CONSTIPATION   meloxicam (MOBIC) 7.5 MG tablet TAKE 1 TABLET BY MOUTH DAILY   montelukast (SINGULAIR) 10 MG tablet Take 1 tablet (10 mg total) by mouth at bedtime.   Multiple Vitamins-Minerals (CENTRUM SILVER 50+WOMEN) TABS Take by mouth.   ondansetron (ZOFRAN) 4 MG tablet TAKE ONE TABLET BY MOUTH EVERY 8 HOURS AS NEEDED FOR NAUSEA AND VOMITING   Polyethyl Glycol-Propyl Glycol (SYSTANE) 0.4-0.3 % SOLN Apply to eye.   polyethylene glycol powder (GLYCOLAX/MIRALAX) 17 GM/SCOOP powder TAKE 17 G BY MOUTH DAILY AS NEEDED FOR MILD CONSTIPATION OR MODERATE CONSTIPATION.   rOPINIRole (REQUIP) 1 MG tablet Take 1 tablet (1 mg total) by mouth 3 (three) times daily as needed (restless legs).   traMADol (ULTRAM) 50 MG tablet TAKE ONE TABLET TWICE A DAY IF NEEDED FOR SEVERE PAIN   traZODone (DESYREL) 50 MG tablet TAKE 1/2 TO 1 TABLET BY MOUTH AT BEDTIMEAS DIRECTED   [DISCONTINUED] bisoprolol-hydrochlorothiazide (ZIAC) 5-6.25 MG tablet Take 1 tablet by mouth daily.   [DISCONTINUED] fluticasone (FLONASE) 50 MCG/ACT nasal spray 1 SPRAY IN EACH NOSTRIL ONCE DAILY AS NEEDED   [DISCONTINUED] oxybutynin (DITROPAN-XL) 10 MG 24 hr tablet Take 1 tablet (10 mg total) by mouth daily.   [DISCONTINUED] promethazine (PHENERGAN) 25 MG tablet  Take 25 mg by mouth as needed.   [DISCONTINUED] simethicone (MYLICON) 80 MG chewable tablet Chew 80 mg by mouth every 6 (six) hours as needed for flatulence.   [DISCONTINUED] Zoster Vaccine Adjuvanted [facility]) injection Inject 0.5 mLs into the muscle once.   bisoprolol-hydrochlorothiazide (ZIAC) 5-6.25 MG tablet Take 1 tablet by mouth daily.   fluticasone (FLONASE) 50 MCG/ACT nasal spray 1 SPRAY IN EACH NOSTRIL ONCE DAILY AS NEEDED   Zoster Vaccine Adjuvanted [facility]) injection Inject 0.5 mLs into the muscle once for 1 dose.    No facility-administered encounter medications on file as of 09/08/2021.    Surgical History: Past Surgical History:  Procedure Laterality Date   COLONOSCOPY  05-03-15   COLONOSCOPY WITH PROPOFOL N/A 05/03/2015   Procedure: COLONOSCOPY WITH PROPOFOL;  Surgeon: Lucilla Lame, MD;  Location: [facility];  Service: Endoscopy;  Laterality: N/A;  CPAP   DILATION AND CURETTAGE OF UTERUS  1970   ENDOMETRIAL BIOPSY     EYE SURGERY Right 2017   POLYPECTOMY  05/03/2015   Procedure: POLYPECTOMY;  Surgeon: Lucilla Lame, MD;  Location: [facility];  Service: Endoscopy;;   ROTATOR CUFF REPAIR  2007   SPINE SURGERY      Medical History: Past Medical History:  Diagnosis Date   Arthritis    Atrophic vaginitis 03/06/2013   Last Assessment & Plan:  She will plan to continue estradiol vaginal cream.  Prescription was rewritten stipulating use of the generic product.    Atypical migraine 03/06/2013   Last Assessment & Plan:  Since she rarely takes sumatriptan, we will discontinue it. Continue sparing use of OTC migraine products.    Avitaminosis D 03/06/2013   Last Assessment & Plan:  Recheck vitamin D level. Plan to continue vitamin D supplementation.    Ceratitis 08/01/2013   Last Assessment & Plan:  Because of the expense, she will discontinue Restasis. She will use over-the-counter moisturizing eyedrops and liquid gel.    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    Cystocele, midline 03/06/2013   Demoralization and apathy 03/06/2013   Last Assessment & Plan:  Since she has been taking bupropion only once a day, I will write the prescription with the correct instructions and correct quantity. She has done well on this for years and she is encouraged  to take it regularly    Depression    Environmental allergies    Epistaxis    Essential (primary) hypertension 05/17/2013   Last Assessment & Plan:  Her blood pressure is well-controlled. We will plan to continue hydrochlorothiazide and benazepril.  Both are generic and should be affordable on her health plan.    GERD (gastroesophageal reflux disease)    Hemorrhoid    Inflammation of sacroiliac joint ([place]) 03/06/2013   Last Assessment & Plan:  She is doing well on her present regimen of fentanyl patch supplemented with sparing use of tramadol/acetaminophen. We will continue that regimen.    Sinusitis    Sleep apnea    CPAP    Family History: Family History  Problem Relation Age of Onset   Arthritis Mother    Alzheimer's disease Father    Heart disease Sister    Cancer Brother    Ovarian cancer Neg Hx  Breast cancer Neg Hx    Colon cancer Neg Hx    Diabetes Neg Hx     Social History   Socioeconomic History   Marital status: Widowed    Spouse name: Not on file   Number of children: 2   Years of education: Not on file   Highest education level: Some college, no degree  Occupational History   Not on file  Tobacco Use   Smoking status: Never   Smokeless tobacco: Never  Vaping Use   Vaping Use: Never used  Substance and Sexual Activity   Alcohol use: No    Alcohol/week: 0.0 standard drinks   Drug use: No   Sexual activity: Not Currently  Other Topics Concern   Not on file  Social History Narrative   Not on file   Social Determinants of Health   Financial Resource Strain: Not on file  Food Insecurity: No Food Insecurity   Worried About Running Out of Food in the Last Year: Never true   Ran Out of Food in the Last Year: Never true  Transportation Needs: No Transportation Needs   Lack of Transportation (Medical): No   Lack of Transportation (Non-Medical): No  Physical Activity: Not on file  Stress: Not on file  Social Connections: Not on file  Intimate Partner  Violence: Not on file      Review of Systems  Constitutional:  Negative for chills, fatigue and unexpected weight change.  HENT:  Positive for postnasal drip. Negative for congestion, rhinorrhea, sneezing and sore throat.   Eyes:  Negative for redness.  Respiratory: Negative.  Negative for cough, chest tightness, shortness of breath and wheezing.   Cardiovascular: Negative.  Negative for chest pain and palpitations.  Gastrointestinal: Negative.  Negative for abdominal pain, constipation, diarrhea, nausea and vomiting.  Genitourinary:  Negative for dysuria and frequency.  Musculoskeletal:  Negative for arthralgias, back pain, joint swelling and neck pain.  Skin:  Negative for rash.  Neurological: Negative.  Negative for tremors and numbness.  Hematological:  Negative for adenopathy. Does not bruise/bleed easily.  Psychiatric/Behavioral:  Positive for behavioral problems (Depression). Negative for self-injury, sleep disturbance and suicidal ideas. The patient is nervous/anxious.    Vital Signs: BP 140/80    Pulse 63    Temp 98.3 F (36.8 C)    Resp 16    Ht _0  (1.626 m)    Wt 124 lb (56.2 kg)    SpO2 99%    BMI 21.28 kg/m    Physical Exam Vitals reviewed.  Constitutional:      General: She is not in acute distress.    Appearance: Normal appearance. She is normal weight. She is not ill-appearing.  HENT:     Head: Normocephalic and atraumatic.  Eyes:     Pupils: Pupils are equal, round, and reactive to light.  Cardiovascular:     Rate and Rhythm: Normal rate and regular rhythm.  Pulmonary:     Effort: Pulmonary effort is normal. No respiratory distress.  Neurological:     Mental Status: She is alert.  Psychiatric:        Mood and Affect: Mood is depressed. Affect is tearful.        Behavior: Behavior normal.       Assessment/Plan: 1. Essential (primary) hypertension Continue blood pressure medication as ordered, stable at this time.  Refills ordered. -  bisoprolol-hydrochlorothiazide (ZIAC) 5-6.25 MG tablet; Take 1 tablet by mouth daily.  Dispense: 30 tablet; Refill: 3  2. Hyperkalemia  Repeat labs to evaluate potassium level - CMP14+EGFR  3. Post-nasal drip Continue Flonase nasal spray.  Start Montelukast 10 mg daily at bedtime. - fluticasone (FLONASE) 50 MCG/ACT nasal spray; 1 SPRAY IN EACH NOSTRIL ONCE DAILY AS NEEDED  Dispense: 16 g; Refill: 1 - montelukast (SINGULAIR) 10 MG tablet; Take 1 tablet (10 mg total) by mouth at bedtime.  Dispense: 30 tablet; Refill: 3  4. Need for vaccination - Zoster Vaccine Adjuvanted [facility]) injection; Inject 0.5 mLs into the muscle once for 1 dose.  Dispense: 0.5 mL; Refill: 0  5. MDD (major depressive disorder), recurrent, in partial remission ([place]) Patient is having increased depressive symptoms and is agreeable to starting back on Lexapro.  Start Lexapro 5 mg once daily.  We will follow-up in 1 month. - escitalopram (LEXAPRO) 5 MG tablet; Take 1 tablet (5 mg total) by mouth daily.  Dispense: 30 tablet; Refill: 3   General Counseling: Kristen Pennington understanding of the findings of todays visit and agrees with plan of treatment. I have discussed any further diagnostic evaluation that may be needed or ordered today. We also reviewed her medications today. she has been encouraged to call the office with any questions or concerns that should arise related to todays visit.    Orders Placed This Encounter  Procedures   CMP14+EGFR    Meds ordered this encounter  Medications   Zoster Vaccine Adjuvanted [facility]) injection    Sig: Inject 0.5 mLs into the muscle once for 1 dose.    Dispense:  0.5 mL    Refill:  0   bisoprolol-hydrochlorothiazide (ZIAC) 5-6.25 MG tablet    Sig: Take 1 tablet by mouth daily.    Dispense:  30 tablet    Refill:  3   fluticasone (FLONASE) 50 MCG/ACT nasal spray    Sig: 1 SPRAY IN EACH NOSTRIL ONCE DAILY AS NEEDED    Dispense:  16 g    Refill:  1    FOR NEXT FILL    escitalopram (LEXAPRO) 5 MG tablet    Sig: Take 1 tablet (5 mg total) by mouth daily.    Dispense:  30 tablet    Refill:  3   montelukast (SINGULAIR) 10 MG tablet    Sig: Take 1 tablet (10 mg total) by mouth at bedtime.    Dispense:  30 tablet    Refill:  3    Return in about 1 month (around 10/06/2021) for F/U, eval new med, Anxiety/depression, Kristen Pennington PCP.   Total time spent:30 Minutes Time spent includes review of chart, medications, test results, and follow up plan with the patient.   [place] Controlled Substance Database was reviewed by me.  This patient was seen by Jonetta Osgood, FNP-C in collaboration with Dr. Clayborn Bigness as a part of collaborative care agreement.   Marycruz Boehner R. Valetta Fuller, MSN, FNP-C Internal medicine

## 2021-09-09 ENCOUNTER — Telehealth (INDEPENDENT_AMBULATORY_CARE_PROVIDER_SITE_OTHER): Payer: Medicare HMO | Admitting: Psychiatry

## 2021-09-09 ENCOUNTER — Encounter: Payer: Self-pay | Admitting: Psychiatry

## 2021-09-09 DIAGNOSIS — G4701 Insomnia due to medical condition: Secondary | ICD-10-CM | POA: Diagnosis not present

## 2021-09-09 DIAGNOSIS — F411 Generalized anxiety disorder: Secondary | ICD-10-CM

## 2021-09-09 DIAGNOSIS — F330 Major depressive disorder, recurrent, mild: Secondary | ICD-10-CM

## 2021-09-09 NOTE — Progress Notes (Signed)
Virtual Visit via Telephone Note  I connected with Kristen Pennington on 09/09/21 at  2:30 PM EST by telephone and verified that I am speaking with the correct person using two identifiers.  Location Provider Location : [facility] Patient Location : Home  Participants: Patient , Provider     I discussed the limitations, risks, security and privacy concerns of performing an evaluation and management service by telephone and the availability of in person appointments. I also discussed with the patient that there may be a patient responsible charge related to this service. The patient expressed understanding and agreed to proceed.     I discussed the assessment and treatment plan with the patient. The patient was provided an opportunity to ask questions and all were answered. The patient agreed with the plan and demonstrated an understanding of the instructions.   The patient was advised to call back or seek an in-person evaluation if the symptoms worsen or if the condition fails to improve as anticipated.    [facility] MD OP Progress Note  09/09/2021 3:09 PM LESETTE FRARY  MRN:  [id number]  Chief Complaint:  Chief Complaint  Patient presents with   Follow-up: 88 year old Caucasian female, widowed, lives in [place], has a history of MDD, GAD, delusional disorder, insomnia, hearing loss, vision loss, was evaluated for medication management.   HPI: HAZELGRACE Pennington is a 88 year old Caucasian female, widowed, lives in [place], has a history of MDD, GAD, delusional disorder, insomnia, hearing loss, vision loss, OSA, hypertension was evaluated by phone today.  Patient today appeared to be alert, oriented to person place time situation.  Patient however reports a lot has happened since her last visit with Probation officer.  Patient reports she has been feeling depressed and anxious since the past several weeks.  She reports her next-door neighbor and a friend had to move out since he got multiple medical  issues and was not doing well.  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  Patient became tearful when she discussed this.  Patient also reports she may have to move out of the current living situation if she does not get approved by the manager tomorrow.  Patient reports all this has been very stressful for her.  She currently does not have a plan as to where she is planning to move out to.  Patient had primary care provider appointment yesterday-09/08/2021-reviewed notes per Ms.Abernathy -was restarted on Lexapro at a low dosage of 5 mg due to her worsening mood symptoms.  Patient today reports she is agreeable to start taking this medication however wanted writer's approval.  She reports sleep is overall okay as long as she takes her trazodone low dosage.  Most nights she takes a half dose of the trazodone 50 mg.  Denies side effects at this time.  Patient denies any suicidality, homicidality or perceptual disturbances.  Patient is not agreeable to referral for psychotherapy sessions, reports she just wants to talk to writer and will not be able to open up to anyone else.  Denies any other concerns today.  Visit Diagnosis:    ICD-10-CM   1. Generalized anxiety disorder  F41.1     2. MDD (major depressive disorder), recurrent episode, mild ([facility])  F33.0     3. 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  Past trials of Klonopin, Abilify, Lexapro, mirtazapine.  Past Medical History:  Past Medical History:  Diagnosis Date   Arthritis    Atrophic vaginitis 03/06/2013   Last Assessment & Plan:  She will plan to continue estradiol vaginal cream.  Prescription was rewritten stipulating use of the generic product.    Atypical migraine 03/06/2013   Last Assessment & Plan:  Since she rarely takes sumatriptan, we will  discontinue it. Continue sparing use of OTC migraine products.    Avitaminosis D 03/06/2013   Last Assessment & Plan:  Recheck vitamin D level. Plan to continue vitamin D supplementation.    Ceratitis 08/01/2013   Last Assessment & Plan:  Because of the expense, she will discontinue Restasis. She will use over-the-counter moisturizing eyedrops and liquid gel.    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    Cystocele, midline 03/06/2013   Demoralization and apathy 03/06/2013   Last Assessment & Plan:  Since she has been taking bupropion only once a day, I will write the prescription with the correct instructions and correct quantity. She has done well on this for years and she is encouraged to take it regularly    Depression    Environmental allergies    Epistaxis    Essential (primary) hypertension 05/17/2013   Last Assessment & Plan:  Her blood pressure is well-controlled. We will plan to continue hydrochlorothiazide and benazepril.  Both are generic and should be affordable on her health plan.    GERD (gastroesophageal reflux disease)    Hemorrhoid    Inflammation of sacroiliac joint ([place]) 03/06/2013   Last Assessment & Plan:  She is doing well on her present regimen of fentanyl patch supplemented with sparing use of tramadol/acetaminophen. We will continue that regimen.    Sinusitis    Sleep apnea    CPAP    Past Surgical History:  Procedure Laterality Date   COLONOSCOPY  05-03-15   COLONOSCOPY WITH PROPOFOL N/A 05/03/2015   Procedure: COLONOSCOPY WITH PROPOFOL;  Surgeon: Lucilla Lame, MD;  Location: [facility];  Service: Endoscopy;  Laterality: N/A;  CPAP   DILATION AND CURETTAGE OF UTERUS  1970   ENDOMETRIAL BIOPSY     EYE SURGERY Right 2017    POLYPECTOMY  05/03/2015   Procedure: POLYPECTOMY;  Surgeon: Lucilla Lame, MD;  Location: [facility];  Service: Endoscopy;;   ROTATOR CUFF REPAIR  2007   SPINE SURGERY      Family Psychiatric History: Reviewed family psychiatric history from progress note on 08/11/2018.  Family History:  Family History  Problem Relation Age of Onset   Arthritis Mother    Alzheimer's disease Father    Heart disease Sister    Cancer Brother    Ovarian cancer Neg Hx    Breast cancer Neg Hx    Colon cancer Neg Hx    Diabetes Neg Hx     Social History: Reviewed social history from progress note on 08/01/2018. Social History   Socioeconomic History   Marital status: Widowed    Spouse name: Not on file   Number of children: 2   Years of education: Not on file   Highest education level: Some college, no degree  Occupational History   Not  on file  Tobacco Use   Smoking status: Never   Smokeless tobacco: Never  Vaping Use   Vaping Use: Never used  Substance and Sexual Activity   Alcohol use: No    Alcohol/week: 0.0 standard drinks   Drug use: No   Sexual activity: Not Currently  Other Topics Concern   Not on file  Social History Narrative   Not on file   Social Determinants of Health   Financial Resource Strain: Not on file  Food Insecurity: No Food Insecurity   Worried About Running Out of Food in the Last Year: Never true   Ran Out of Food in the Last Year: Never true  Transportation Needs: No Transportation Needs   Lack of Transportation (Medical): No   Lack of Transportation (Non-Medical): No  Physical Activity: Not on file  Stress: Not on file  Social Connections: Not on file    Allergies: No Known Allergies  Metabolic Disorder Labs: Lab Results  Component Value Date   HGBA1C 5.4 08/01/2018   Lab Results  Component Value Date   PROLACTIN 8.1 08/01/2018   Lab Results  Component Value Date   CHOL 191 11/07/2020   TRIG 98 11/07/2020   HDL 54 11/07/2020    CHOLHDL 3.5 11/07/2020   VLDL 20 11/07/2020   LDLCALC 117 (H) 11/07/2020   LDLCALC 159 (H) 08/01/2018   Lab Results  Component Value Date   TSH 2.522 11/07/2020   TSH 2.830 08/10/2018    Therapeutic Level Labs: No results found for: LITHIUM No results found for: VALPROATE No components found for:  CBMZ  Current Medications: Current Outpatient Medications  Medication Sig Dispense Refill   acetaminophen (TYLENOL) 500 MG tablet Take 500 mg by mouth every 6 (six) hours as needed.     bisoprolol-hydrochlorothiazide (ZIAC) 5-6.25 MG tablet Take 1 tablet by mouth daily. 30 tablet 3   Cholecalciferol (VITAMIN D3) 5000 units TABS Take 5,000 Units by mouth daily.     cyclobenzaprine (FLEXERIL) 10 MG tablet TAKE 1 TABLET BY MOUTH AT BEDTIME FOR BACK SPASMS AS NEEDED ONLY. 30 tablet 3   escitalopram (LEXAPRO) 5 MG tablet Take 1 tablet (5 mg total) by mouth daily. 30 tablet 3   estradiol (ESTRACE) 0.1 MG/GM vaginal cream Use once a week as needed 42.5 g 1   fluticasone (FLONASE) 50 MCG/ACT nasal spray 1 SPRAY IN EACH NOSTRIL ONCE DAILY AS NEEDED 16 g 1   furosemide (LASIX) 20 MG tablet TAKE ONE TABLET BY MOUTH EVERY DAY 30 tablet 3   Liniments (SALONPAS PAIN RELIEF PATCH EX) Apply 1 patch topically 2 (two) times daily.     lubiprostone (AMITIZA) 8 MCG capsule TAKE 1 CAPSULE BY MOUTH ONCE DAILY FOR CONSTIPATION 30 capsule 3   meloxicam (MOBIC) 7.5 MG tablet TAKE 1 TABLET BY MOUTH DAILY 30 tablet 2   montelukast (SINGULAIR) 10 MG tablet Take 1 tablet (10 mg total) by mouth at bedtime. 30 tablet 3   Multiple Vitamins-Minerals (CENTRUM SILVER 50+WOMEN) TABS Take by mouth.     ondansetron (ZOFRAN) 4 MG tablet TAKE ONE TABLET BY MOUTH EVERY 8 HOURS AS NEEDED FOR NAUSEA AND VOMITING 20 tablet 0   Polyethyl Glycol-Propyl Glycol (SYSTANE) 0.4-0.3 % SOLN Apply to eye.     polyethylene glycol powder (GLYCOLAX/MIRALAX) 17 GM/SCOOP powder TAKE 17 G BY MOUTH DAILY AS NEEDED FOR MILD CONSTIPATION OR MODERATE  CONSTIPATION. 238 g 3   rOPINIRole (REQUIP) 1 MG tablet Take 1 tablet (1 mg total) by mouth 3 (  three) times daily as needed (restless legs). 90 tablet 5   traMADol (ULTRAM) 50 MG tablet TAKE ONE TABLET TWICE A DAY IF NEEDED FOR SEVERE PAIN 60 tablet 1   traZODone (DESYREL) 50 MG tablet TAKE 1/2 TO 1 TABLET BY MOUTH AT BEDTIMEAS DIRECTED 30 tablet 2   No current facility-administered medications for this visit.     Musculoskeletal: Strength & Muscle Tone:  UTA Gait & Station:  WNL Patient leans: N/A  Psychiatric Specialty Exam: Review of Systems  Psychiatric/Behavioral:  Positive for dysphoric mood. The patient is nervous/anxious.   All other systems reviewed and are negative.  There were no vitals taken for this visit.There is no height or weight on file to calculate BMI.  General Appearance:  UTA  Eye Contact:   UTA  Speech:  Clear and Coherent  Volume:  Normal  Mood:  Anxious and Dysphoric  Affect:   UTA  Thought Process:  Goal Directed and Descriptions of Associations: Intact  Orientation:  Full (Time, Place, and Person)  Thought Content: Rumination   Suicidal Thoughts:  No  Homicidal Thoughts:  No  Memory:  Immediate;   Fair Recent;   Fair Remote;   Limited  Judgement:  Fair  Insight:  Fair  Psychomotor Activity:   UTA  Concentration:  Concentration: Fair and Attention Span: Fair  Recall:  [employer] of Knowledge: Fair  Language: Fair  Akathisia:  No  Handed:  Left  AIMS (if indicated): not done  Assets:  Wellsite geologist  ADL's:  Intact  Cognition: WNL  Sleep:  Fair   Screenings: Personnel officer Visit from 03/10/2021 in [facility] [place] from 03/07/2020 in [facility], [place] from 03/06/2019 in [facility], [place] from 03/02/2018 in [facility], [facility]  Total Score (max 30 points ) 29 30 30 27       PHQ2-9     [place] Visit from 09/08/2021 in [facility] Office Visit from 06/23/2021 in [facility], [facility] Office Visit from 06/11/2021 in [place] Visit from 03/24/2021 in [facility], [facility] Office Visit from 03/10/2021 in [facility], [facility]  PHQ-2 Total Score 0 0 0 0 0      [place] [facility] from 12/05/2020 in [facility] from 11/04/2020 in [place] Video Visit from 08/29/2020 in [place] No Risk No Risk No Risk        Assessment and Plan: RIVEN BEEBE is a 88 year old female, has a history of GAD, MDD, history of psychosis, essential hypertension, chronic pain, OSA, hearing loss, was evaluated by phone today.  Patient is currently struggling with multiple psychosocial stressors as noted above, will benefit from the following plan for worsening mood symptoms.  Plan GAD-unstable Restart Lexapro 5 mg p.o. daily.  Encouraged patient to take the medication.  This was just started recently. Discussed referral for CBT-patient declined.  MDD-unstable Lexapro 5 mg p.o. daily.   Insomnia-stable Trazodone 25 to 50 mg p.o. nightly as needed  Follow-up in clinic in 2 to 3 weeks or sooner in person.   Collaboration of Care: Collaboration of Care: Patient refused AEB patient declined referral for psychotherapy sessions.  Patient/Guardian was advised Release of Information must be obtained prior to any record release in order to collaborate their care  with an outside provider. Patient/Guardian was advised if they have not already done so to contact the registration department to sign all necessary forms in order for [place] to release information regarding their care.   Consent: Patient/Guardian gives verbal consent for treatment and assignment of benefits for  services provided during this visit. Patient/Guardian expressed understanding and agreed to proceed.    I have spent at least 20 minutes non face to face with patient today .  This note was generated in part or whole with voice recognition software. Voice recognition is usually quite accurate but there are transcription errors that can and very often do occur. I apologize for any typographical errors that were not detected and corrected.        Ursula Alert, MD 09/10/2021, 1:00 PM

## 2021-09-11 ENCOUNTER — Other Ambulatory Visit: Payer: Self-pay | Admitting: Psychiatry

## 2021-09-11 DIAGNOSIS — F411 Generalized anxiety disorder: Secondary | ICD-10-CM

## 2021-09-17 ENCOUNTER — Telehealth: Payer: Self-pay

## 2021-09-17 NOTE — Telephone Encounter (Signed)
[facility] called that they like to know that pt still taking lexapro advised her that as per alyssa she gave her on 09/08/21 lexapro 5 mg and make her follow up in month phar said pt saying that on bottle said not appropriate and no med in bottle and they filled on 2/27 advised phar to call us back let us what's going on with med  ?

## 2021-09-22 DIAGNOSIS — E875 Hyperkalemia: Secondary | ICD-10-CM | POA: Diagnosis not present

## 2021-09-23 LAB — CMP14+EGFR
ALT: 17 IU/L (ref 0–32)
AST: 22 IU/L (ref 0–40)
Albumin/Globulin Ratio: 2.3 — ABNORMAL HIGH (ref 1.2–2.2)
Albumin: 4.8 g/dL — ABNORMAL HIGH (ref 3.5–4.6)
Alkaline Phosphatase: 67 IU/L (ref 44–121)
BUN/Creatinine Ratio: 17 (ref 12–28)
BUN: 18 mg/dL (ref 10–36)
Bilirubin Total: 0.5 mg/dL (ref 0.0–1.2)
CO2: 25 mmol/L (ref 20–29)
Calcium: 10.2 mg/dL (ref 8.7–10.3)
Chloride: 104 mmol/L (ref 96–106)
Creatinine, Ser: 1.04 mg/dL — ABNORMAL HIGH (ref 0.57–1.00)
Globulin, Total: 2.1 g/dL (ref 1.5–4.5)
Glucose: 88 mg/dL (ref 70–99)
Potassium: 4 mmol/L (ref 3.5–5.2)
Sodium: 146 mmol/L — ABNORMAL HIGH (ref 134–144)
Total Protein: 6.9 g/dL (ref 6.0–8.5)
eGFR: 51 mL/min/{1.73_m2} — ABNORMAL LOW (ref >59)

## 2021-09-29 ENCOUNTER — Encounter: Payer: Self-pay | Admitting: Nurse Practitioner

## 2021-09-29 ENCOUNTER — Other Ambulatory Visit: Payer: Self-pay

## 2021-09-29 ENCOUNTER — Ambulatory Visit (INDEPENDENT_AMBULATORY_CARE_PROVIDER_SITE_OTHER): Payer: Medicare HMO | Admitting: Nurse Practitioner

## 2021-09-29 VITALS — BP 140/70 | HR 62 | Temp 98.5°F | Resp 16 | Ht 65.0 in | Wt 126.0 lb

## 2021-09-29 DIAGNOSIS — F3341 Major depressive disorder, recurrent, in partial remission: Secondary | ICD-10-CM | POA: Diagnosis not present

## 2021-09-29 DIAGNOSIS — R413 Other amnesia: Secondary | ICD-10-CM | POA: Diagnosis not present

## 2021-09-29 DIAGNOSIS — F411 Generalized anxiety disorder: Secondary | ICD-10-CM

## 2021-09-29 DIAGNOSIS — I10 Essential (primary) hypertension: Secondary | ICD-10-CM

## 2021-09-29 MED ORDER — ESCITALOPRAM OXALATE 5 MG PO TABS: 5.0000 mg | ORAL_TABLET | Freq: Every day | ORAL | 3 refills | Status: DC
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
## 2021-10-01 ENCOUNTER — Other Ambulatory Visit: Payer: Self-pay

## 2021-10-01 ENCOUNTER — Encounter: Payer: Self-pay | Admitting: Psychiatry

## 2021-10-01 ENCOUNTER — Ambulatory Visit (INDEPENDENT_AMBULATORY_CARE_PROVIDER_SITE_OTHER): Payer: Medicare HMO | Admitting: Psychiatry

## 2021-10-01 ENCOUNTER — Encounter: Payer: Self-pay | Admitting: Nurse Practitioner

## 2021-10-01 VITALS — BP 196/82 | HR 68 | Temp 98.7°F | Wt 125.6 lb

## 2021-10-01 DIAGNOSIS — F3342 Major depressive disorder, recurrent, in full remission: Secondary | ICD-10-CM | POA: Diagnosis not present

## 2021-10-01 DIAGNOSIS — G4701 Insomnia due to medical condition: Secondary | ICD-10-CM

## 2021-10-01 DIAGNOSIS — F411 Generalized anxiety disorder: Secondary | ICD-10-CM

## 2021-10-01 NOTE — Progress Notes (Signed)
[facility] MD OP Progress Note ? ?10/01/2021 4:40 PM ?Kristen Pennington  ?MRN:  [id number] ? ?Chief Complaint:  ?Chief Complaint  ?Patient presents with  ? Follow-up: 88 year old Caucasian female, widowed, lives in [place], has a history of MDD, GAD, delusional disorder, insomnia, hearing loss, vision loss was evaluated for medication management.  ? ?HPI: Kristen Pennington is a 88 year old Caucasian female, widowed, lives in [place], has a history of MDD, GAD, delusional disorder, insomnia, hearing loss, vision loss, OSA, hypertension was evaluated in office today. ? ?Patient today appeared to be alert, oriented to person place time and situation. ? ?Patient reports she is anxious about her current billing issues for her visits.  She is trying to figure that out. ? ?Patient reports overall she has been otherwise doing fairly well.  She reports her daughter came in and spent the weekend with her and they had a good mother to daughter talk.  That made her feel good.  Patient reports she is planning to find a place closer to her daughter in [place].  Until then she is going to stay here. ? ?Patient reports she is currently taking the Lexapro 5 mg.  Reports tolerating it well.  Denies side effects. ? ?Reports sleep is good.  Currently does have trazodone available as needed. ? ?Reports appetite is fair. ? ?Denies suicidality, homicidality or perceptual disturbances. ? ?Patient denies any other concerns today. ? ?Visit Diagnosis:  ?  ICD-10-CM   ?1. Generalized anxiety disorder  F41.1   ?  ?2. MDD (major depressive disorder), recurrent, in full remission ([place])  F33.42   ?  ?3. Insomnia due to medical condition  G47.01   ? mood, pain  ?  ? ? ?Past Psychiatric History: Reviewed past psychiatric history from progress note on 08/11/2018.  Past trials of Klonopin, Abilify, Lexapro, mirtazapine.   ? ?Past Medical History:  ?Past Medical History:  ?Diagnosis Date  ? Arthritis   ? Atrophic vaginitis 03/06/2013  ? Last Assessment &  Plan:  She will plan to continue estradiol vaginal cream.  Prescription was rewritten stipulating use of the generic product.   ? Atypical migraine 03/06/2013  ? Last Assessment & Plan:  Since she rarely takes sumatriptan, we will discontinue it. Continue sparing use of OTC migraine products.   ? Avitaminosis D 03/06/2013  ? Last Assessment & Plan:  Recheck vitamin D level. Plan to continue vitamin D supplementation.   ? Ceratitis 08/01/2013  ? Last Assessment & Plan:  Because of the expense, she will discontinue Restasis. She will use over-the-counter moisturizing eyedrops and liquid gel.   ? Colon polyp   ? Combined fat and carbohydrate induced hyperlipemia 03/06/2013  ? Last Assessment & Plan:  Recheck fasting lipids.   ? 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 Last Assessment & Plan:  She has been doing well on ropinirole so we will plan to continue that.   ? Cystocele, midline 03/06/2013  ? Demoralization and apathy 03/06/2013  ? Last Assessment & Plan:  Since she has been taking bupropion only once a day, I will write the prescription with the correct instructions and correct quantity. She has done well on this for years and she is encouraged to take it regularly   ? Depression   ? Environmental allergies   ? Epistaxis   ? Essential (primary) hypertension 05/17/2013  ? Last Assessment & Plan:  Her blood pressure is well-controlled. We will plan to continue hydrochlorothiazide and benazepril.  Both are generic and should be  affordable on her health plan.   ? GERD (gastroesophageal reflux disease)   ? Hemorrhoid   ? Inflammation of sacroiliac joint ([place]) 03/06/2013  ? Last Assessment & Plan:  She is doing well on her present regimen of fentanyl patch supplemented with sparing use of tramadol/acetaminophen. We will continue that regimen.   ? Sinusitis   ? Sleep apnea   ? CPAP  ?  ?Past Surgical History:  ?Procedure Laterality Date  ? COLONOSCOPY  05-03-15  ? COLONOSCOPY WITH PROPOFOL N/A 05/03/2015  ?  Procedure: COLONOSCOPY WITH PROPOFOL;  Surgeon: Lucilla Lame, MD;  Location: [facility];  Service: Endoscopy;  Laterality: N/A;  CPAP  ? [place] OF UTERUS  1970  ? ENDOMETRIAL BIOPSY    ? EYE SURGERY Right 2017  ? POLYPECTOMY  05/03/2015  ? Procedure: POLYPECTOMY;  Surgeon: Lucilla Lame, MD;  Location: [facility];  Service: Endoscopy;;  ? ROTATOR CUFF REPAIR  2007  ? SPINE SURGERY    ? ? ?Family Psychiatric History: Reviewed family psychiatric history from progress note on 08/11/2018. ? ?Family History:  ?Family History  ?Problem Relation Age of Onset  ? Arthritis Mother   ? Alzheimer's disease Father   ? Heart disease Sister   ? Cancer Brother   ? Ovarian cancer Neg Hx   ? Breast cancer Neg Hx   ? Colon cancer Neg Hx   ? Diabetes Neg Hx   ? ? ?Social History: Reviewed social history from progress note on 08/11/2018. ?Social History  ? ?Socioeconomic History  ? Marital status: Widowed  ?  Spouse name: Not on file  ? Number of children: 2  ? Years of education: Not on file  ? Highest education level: Some college, no degree  ?Occupational History  ? Not on file  ?Tobacco Use  ? Smoking status: Never  ? Smokeless tobacco: Never  ?Vaping Use  ? Vaping Use: Never used  ?Substance and Sexual Activity  ? Alcohol use: No  ?  Alcohol/week: 0.0 standard drinks  ? Drug use: No  ? Sexual activity: Not Currently  ?Other Topics Concern  ? Not on file  ?Social History Narrative  ? Not on file  ? ?Social Determinants of Health  ? ?Financial Resource Strain: Not on file  ?Food Insecurity: No Food Insecurity  ? Worried About Charity fundraiser in the Last Year: Never true  ? Ran Out of Food in the Last Year: Never true  ?Transportation Needs: No Transportation Needs  ? Lack of Transportation (Medical): No  ? Lack of Transportation (Non-Medical): No  ?Physical Activity: Not on file  ?Stress: Not on file  ?Social Connections: Not on file  ? ? ?Allergies: No Known Allergies ? ?Metabolic Disorder Labs: ?Lab  Results  ?Component Value Date  ? HGBA1C 5.4 08/01/2018  ? ?Lab Results  ?Component Value Date  ? PROLACTIN 8.1 08/01/2018  ? ?Lab Results  ?Component Value Date  ? CHOL 191 11/07/2020  ? TRIG 98 11/07/2020  ? HDL 54 11/07/2020  ? CHOLHDL 3.5 11/07/2020  ? VLDL 20 11/07/2020  ? LDLCALC 117 (H) 11/07/2020  ? LDLCALC 159 (H) 08/01/2018  ? ?Lab Results  ?Component Value Date  ? TSH 2.522 11/07/2020  ? TSH 2.830 08/10/2018  ? ? ?Therapeutic Level Labs: ?No results found for: LITHIUM ?No results found for: VALPROATE ?No components found for:  CBMZ ? ?Current Medications: ?Current Outpatient Medications  ?Medication Sig Dispense Refill  ? acetaminophen (TYLENOL) 500 MG tablet Take  500 mg by mouth every 6 (six) hours as needed.    ? bisoprolol-hydrochlorothiazide (ZIAC) 5-6.25 MG tablet Take 1 tablet by mouth daily. 30 tablet 3  ? Cholecalciferol (VITAMIN D3) 5000 units TABS Take 5,000 Units by mouth daily.    ? cyclobenzaprine (FLEXERIL) 10 MG tablet TAKE 1 TABLET BY MOUTH AT BEDTIME FOR BACK SPASMS AS NEEDED ONLY. 30 tablet 3  ? escitalopram (LEXAPRO) 5 MG tablet Take 1 tablet (5 mg total) by mouth daily. 30 tablet 3  ? estradiol (ESTRACE) 0.1 MG/GM vaginal cream Use once a week as needed 42.5 g 1  ? fluticasone (FLONASE) 50 MCG/ACT nasal spray 1 SPRAY IN EACH NOSTRIL ONCE DAILY AS NEEDED 16 g 1  ? furosemide (LASIX) 20 MG tablet TAKE ONE TABLET BY MOUTH EVERY DAY 30 tablet 3  ? Liniments (SALONPAS PAIN RELIEF PATCH EX) Apply 1 patch topically 2 (two) times daily.    ? lubiprostone (AMITIZA) 8 MCG capsule TAKE 1 CAPSULE BY MOUTH ONCE DAILY FOR CONSTIPATION 30 capsule 3  ? meloxicam (MOBIC) 7.5 MG tablet TAKE 1 TABLET BY MOUTH DAILY 30 tablet 2  ? montelukast (SINGULAIR) 10 MG tablet Take 1 tablet (10 mg total) by mouth at bedtime. 30 tablet 3  ? Multiple Vitamins-Minerals (CENTRUM SILVER 50+WOMEN) TABS Take by mouth.    ? ondansetron (ZOFRAN) 4 MG tablet TAKE ONE TABLET BY MOUTH EVERY 8 HOURS AS NEEDED FOR NAUSEA AND  VOMITING 20 tablet 0  ? Polyethyl Glycol-Propyl Glycol (SYSTANE) 0.4-0.3 % SOLN Apply to eye.    ? polyethylene glycol powder (GLYCOLAX/MIRALAX) 17 GM/SCOOP powder TAKE 17 G BY MOUTH DAILY AS NEEDED FOR MIL

## 2021-10-29 ENCOUNTER — Ambulatory Visit (INDEPENDENT_AMBULATORY_CARE_PROVIDER_SITE_OTHER): Payer: Medicare HMO | Admitting: Nurse Practitioner

## 2021-10-29 ENCOUNTER — Encounter: Payer: Self-pay | Admitting: Nurse Practitioner

## 2021-10-29 VITALS — BP 140/75 | HR 77 | Temp 97.9°F | Resp 16 | Ht 65.0 in | Wt 127.0 lb

## 2021-10-29 DIAGNOSIS — I10 Essential (primary) hypertension: Secondary | ICD-10-CM | POA: Diagnosis not present

## 2021-10-29 DIAGNOSIS — G4701 Insomnia due to medical condition: Secondary | ICD-10-CM

## 2021-10-29 DIAGNOSIS — R110 Nausea: Secondary | ICD-10-CM

## 2021-10-29 DIAGNOSIS — G8929 Other chronic pain: Secondary | ICD-10-CM | POA: Diagnosis not present

## 2021-10-29 DIAGNOSIS — M545 Low back pain, unspecified: Secondary | ICD-10-CM

## 2021-10-29 DIAGNOSIS — R0982 Postnasal drip: Secondary | ICD-10-CM

## 2021-10-29 DIAGNOSIS — R600 Localized edema: Secondary | ICD-10-CM

## 2021-10-29 DIAGNOSIS — G2581 Restless legs syndrome: Secondary | ICD-10-CM

## 2021-10-29 DIAGNOSIS — F411 Generalized anxiety disorder: Secondary | ICD-10-CM | POA: Diagnosis not present

## 2021-10-29 DIAGNOSIS — K5900 Constipation, unspecified: Secondary | ICD-10-CM

## 2021-10-29 MED ORDER — ONDANSETRON HCL 4 MG PO TABS: 4.0000 mg | ORAL_TABLET | Freq: Three times a day (TID) | ORAL | 2 refills | Status: DC | PRN

## 2021-10-29 MED ORDER — FUROSEMIDE 20 MG PO TABS: 20.0000 mg | ORAL_TABLET | Freq: Every day | ORAL | 3 refills | Status: DC

## 2021-10-29 MED ORDER — POLYETHYLENE GLYCOL 3350 17 GM/SCOOP PO POWD: 17.0000 g | Freq: Every day | ORAL | 3 refills | Status: DC | PRN

## 2021-10-29 MED ORDER — FLUTICASONE PROPIONATE 50 MCG/ACT NA SUSP: NASAL | 1 refills | Status: DC

## 2021-10-29 MED ORDER — ESCITALOPRAM OXALATE 10 MG PO TABS: 10.0000 mg | ORAL_TABLET | Freq: Every day | ORAL | 5 refills | Status: DC

## 2021-10-29 MED ORDER — ESTRADIOL 0.1 MG/GM VA CREA: TOPICAL_CREAM | VAGINAL | 1 refills | Status: DC

## 2021-10-29 MED ORDER — LUBIPROSTONE 8 MCG PO CAPS: ORAL_CAPSULE | ORAL | 3 refills | Status: DC

## 2021-10-29 MED ORDER — TRAZODONE HCL 50 MG PO TABS: 25.0000 mg | ORAL_TABLET | Freq: Every day | ORAL | 2 refills | Status: DC

## 2021-10-29 MED ORDER — BISOPROLOL-HYDROCHLOROTHIAZIDE 5-6.25 MG PO TABS: 1.0000 | ORAL_TABLET | Freq: Every day | ORAL | 3 refills | Status: DC

## 2021-10-29 MED ORDER — TRAMADOL HCL 50 MG PO TABS: ORAL_TABLET | ORAL | 2 refills | Status: DC

## 2021-10-29 MED ORDER — DICLOFENAC SODIUM 50 MG PO TBEC: 50.0000 mg | DELAYED_RELEASE_TABLET | Freq: Two times a day (BID) | ORAL | 3 refills | Status: DC

## 2021-10-29 MED ORDER — MONTELUKAST SODIUM 10 MG PO TABS: 10.0000 mg | ORAL_TABLET | Freq: Every day | ORAL | 3 refills | Status: DC

## 2021-10-29 MED ORDER — ROPINIROLE HCL 1 MG PO TABS: 1.0000 mg | ORAL_TABLET | Freq: Three times a day (TID) | ORAL | 5 refills | Status: DC | PRN

## 2021-10-29 NOTE — Progress Notes (Signed)
[facility] ?[address] ?[place], [place] [address] ? ?Internal MEDICINE  ?Office Visit Note ? ?Patient Name: Kristen Pennington ? [id number]  ?[id number] ? ?Date of Service: 10/29/2021 ? ?Chief Complaint  ?Patient presents with  ? Follow-up  ? Depression  ? Hyperlipidemia  ? Hypertension  ? Gastroesophageal Reflux  ? ? ?HPI ?Kristen Pennington presents for a follow-up visit for hypertension, hyperlipidemia, depression and anxiety.  At her previous office visit, she had seen Dr. Shea Evans trying to see if she can work out the billing issues with [facility] so that she can continue to see her and also to clarify if she also agreed that taking Lexapro is a good idea which Dr. Shea Evans did and Kristen Pennington agreed to start taking Lexapro 5 mg daily.  Since her previous office visit she had 1 more office visit with Dr. Shea Evans and unfortunately is still dealing with the billing issue, and health and has opted to stop seeing Dr. Lalla Brothers for the time being.  ?Kristen Pennington has been working through some issues with the management at the senior living facility that she lives at across the street from the clinic.  She spoke with the manager of the facility and said that she feels like she is being heard. ?She has been talking about possibly moving closer to her daughter who lives in [place] but this plan is not yet in motion.  ? ? ? ? ? ? ?Current Medication: ?Outpatient Encounter Medications as of 10/29/2021  ?Medication Sig  ? acetaminophen (TYLENOL) 500 MG tablet Take 500 mg by mouth every 6 (six) hours as needed.  ? Cholecalciferol (VITAMIN D3) 5000 units TABS Take 5,000 Units by mouth daily.  ? cyclobenzaprine (FLEXERIL) 10 MG tablet TAKE 1 TABLET BY MOUTH AT BEDTIME FOR BACK SPASMS AS NEEDED ONLY.  ? diclofenac (VOLTAREN) 50 MG EC tablet Take 1 tablet (50 mg total) by mouth 2 (two) times daily.  ? escitalopram (LEXAPRO) 10 MG tablet Take 1 tablet (10 mg total) by mouth daily.  ? Liniments (SALONPAS PAIN RELIEF PATCH EX) Apply 1 patch topically 2  (two) times daily.  ? Multiple Vitamins-Minerals (CENTRUM SILVER 50+WOMEN) TABS Take by mouth.  ? Polyethyl Glycol-Propyl Glycol (SYSTANE) 0.4-0.3 % SOLN Apply to eye.  ? [DISCONTINUED] bisoprolol-hydrochlorothiazide (ZIAC) 5-6.25 MG tablet Take 1 tablet by mouth daily.  ? [DISCONTINUED] escitalopram (LEXAPRO) 5 MG tablet Take 1 tablet (5 mg total) by mouth daily.  ? [DISCONTINUED] estradiol (ESTRACE) 0.1 MG/GM vaginal cream Use once a week as needed  ? [DISCONTINUED] fluticasone (FLONASE) 50 MCG/ACT nasal spray 1 SPRAY IN EACH NOSTRIL ONCE DAILY AS NEEDED  ? [DISCONTINUED] furosemide (LASIX) 20 MG tablet TAKE ONE TABLET BY MOUTH EVERY DAY  ? [DISCONTINUED] lubiprostone (AMITIZA) 8 MCG capsule TAKE 1 CAPSULE BY MOUTH ONCE DAILY FOR CONSTIPATION  ? [DISCONTINUED] meloxicam (MOBIC) 7.5 MG tablet TAKE 1 TABLET BY MOUTH DAILY  ? [DISCONTINUED] montelukast (SINGULAIR) 10 MG tablet Take 1 tablet (10 mg total) by mouth at bedtime.  ? [DISCONTINUED] ondansetron (ZOFRAN) 4 MG tablet TAKE ONE TABLET BY MOUTH EVERY 8 HOURS AS NEEDED FOR NAUSEA AND VOMITING  ? [DISCONTINUED] polyethylene glycol powder (GLYCOLAX/MIRALAX) 17 GM/SCOOP powder TAKE 17 G BY MOUTH DAILY AS NEEDED FOR MILD CONSTIPATION OR MODERATE CONSTIPATION.  ? [DISCONTINUED] rOPINIRole (REQUIP) 1 MG tablet Take 1 tablet (1 mg total) by mouth 3 (three) times daily as needed (restless legs).  ? [DISCONTINUED] traMADol (ULTRAM) 50 MG tablet TAKE ONE TABLET TWICE A DAY IF NEEDED FOR  SEVERE PAIN  ? [DISCONTINUED] traZODone (DESYREL) 50 MG tablet TAKE 1/2 TO 1 TABLET BY MOUTH AT BEDTIMEAS DIRECTED  ? bisoprolol-hydrochlorothiazide (ZIAC) 5-6.25 MG tablet Take 1 tablet by mouth daily.  ? estradiol (ESTRACE) 0.1 MG/GM vaginal cream Use once a week as needed  ? fluticasone (FLONASE) 50 MCG/ACT nasal spray 1 SPRAY IN EACH NOSTRIL ONCE DAILY AS NEEDED  ? furosemide (LASIX) 20 MG tablet Take 1 tablet (20 mg total) by mouth daily.  ? lubiprostone (AMITIZA) 8 MCG capsule TAKE 1  CAPSULE BY MOUTH ONCE DAILY FOR CONSTIPATION  ? montelukast (SINGULAIR) 10 MG tablet Take 1 tablet (10 mg total) by mouth at bedtime.  ? ondansetron (ZOFRAN) 4 MG tablet Take 1 tablet (4 mg total) by mouth every 8 (eight) hours as needed for nausea or vomiting.  ? polyethylene glycol powder (GLYCOLAX/MIRALAX) 17 GM/SCOOP powder Take 17 g by mouth daily as needed for mild constipation or moderate constipation.  ? rOPINIRole (REQUIP) 1 MG tablet Take 1 tablet (1 mg total) by mouth 3 (three) times daily as needed (restless legs).  ? traMADol (ULTRAM) 50 MG tablet TAKE ONE TABLET TWICE A DAY IF NEEDED FOR SEVERE PAIN  ? traZODone (DESYREL) 50 MG tablet Take 0.5-1 tablets (25-50 mg total) by mouth at bedtime.  ? ?No facility-administered encounter medications on file as of 10/29/2021.  ? ? ?Surgical History: ?Past Surgical History:  ?Procedure Laterality Date  ? COLONOSCOPY  05-03-15  ? COLONOSCOPY WITH PROPOFOL N/A 05/03/2015  ? Procedure: COLONOSCOPY WITH PROPOFOL;  Surgeon: Lucilla Lame, MD;  Location: [facility];  Service: Endoscopy;  Laterality: N/A;  CPAP  ? [place] OF UTERUS  1970  ? ENDOMETRIAL BIOPSY    ? EYE SURGERY Right 2017  ? POLYPECTOMY  05/03/2015  ? Procedure: POLYPECTOMY;  Surgeon: Lucilla Lame, MD;  Location: [facility];  Service: Endoscopy;;  ? ROTATOR CUFF REPAIR  2007  ? SPINE SURGERY    ? ? ?Medical History: ?Past Medical History:  ?Diagnosis Date  ? Arthritis   ? Atrophic vaginitis 03/06/2013  ? Last Assessment & Plan:  She will plan to continue estradiol vaginal cream.  Prescription was rewritten stipulating use of the generic product.   ? Atypical migraine 03/06/2013  ? Last Assessment & Plan:  Since she rarely takes sumatriptan, we will discontinue it. Continue sparing use of OTC migraine products.   ? Avitaminosis D 03/06/2013  ? Last Assessment & Plan:  Recheck vitamin D level. Plan to continue vitamin D supplementation.   ? Ceratitis 08/01/2013  ? Last Assessment &  Plan:  Because of the expense, she will discontinue Restasis. She will use over-the-counter moisturizing eyedrops and liquid gel.   ? Colon polyp   ? Combined fat and carbohydrate induced hyperlipemia 03/06/2013  ? Last Assessment & Plan:  Recheck fasting lipids.   ? 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 Last Assessment & Plan:  She has been doing well on ropinirole so we will plan to continue that.   ? Cystocele, midline 03/06/2013  ? Demoralization and apathy 03/06/2013  ? Last Assessment & Plan:  Since she has been taking bupropion only once a day, I will write the prescription with the correct instructions and correct quantity. She has done well on this for years and she is encouraged to take it regularly   ? Depression   ? Environmental allergies   ? Epistaxis   ? Essential (primary) hypertension 05/17/2013  ? Last Assessment & Plan:  Her  blood pressure is well-controlled. We will plan to continue hydrochlorothiazide and benazepril.  Both are generic and should be affordable on her health plan.   ? GERD (gastroesophageal reflux disease)   ? Hemorrhoid   ? Inflammation of sacroiliac joint ([place]) 03/06/2013  ? Last Assessment & Plan:  She is doing well on her present regimen of fentanyl patch supplemented with sparing use of tramadol/acetaminophen. We will continue that regimen.   ? Sinusitis   ? Sleep apnea   ? CPAP  ? ? ?Family History: ?Family History  ?Problem Relation Age of Onset  ? Arthritis Mother   ? Alzheimer's disease Father   ? Heart disease Sister   ? Cancer Brother   ? Ovarian cancer Neg Hx   ? Breast cancer Neg Hx   ? Colon cancer Neg Hx   ? Diabetes Neg Hx   ? ? ?Social History  ? ?Socioeconomic History  ? Marital status: Widowed  ?  Spouse name: Not on file  ? Number of children: 2  ? Years of education: Not on file  ? Highest education level: Some college, no degree  ?Occupational History  ? Not on file  ?Tobacco Use  ? Smoking status: Never  ? Smokeless tobacco: Never  ?Vaping Use   ? Vaping Use: Never used  ?Substance and Sexual Activity  ? Alcohol use: No  ?  Alcohol/week: 0.0 standard drinks  ? Drug use: No  ? Sexual activity: Not Currently  ?Other Topics Concern  ? Not on fil

## 2021-10-30 ENCOUNTER — Other Ambulatory Visit: Payer: Self-pay | Admitting: *Deleted

## 2021-10-30 NOTE — Patient Instructions (Signed)
Visit Information ? ?Thank you for taking time to visit with me today. Please don't hesitate to contact me if I can be of assistance to you before our next scheduled telephone appointment. ? ?Following are the goals we discussed today:  ? Take all medications as prescribed ?Attend all scheduled provider appointments ?Call pharmacy for medication refills 3-7 days in advance of running out of medications ?Attend church or other social activities ?Perform all self care activities independently  ?Perform IADL's (shopping, preparing meals, housekeeping, managing finances) independently ?Call provider office for new concerns or questions  ?check blood pressure 3 times per week ?choose a place to take my blood pressure (home, clinic or office, retail store) ?write blood pressure results in a log or diary ?learn about high blood pressure ?keep a blood pressure log ?take blood pressure log to all doctor appointments ?call doctor for signs and symptoms of high blood pressure ?keep all doctor appointments ?take medications for blood pressure exactly as prescribed ?report new symptoms to your doctor ?

## 2021-11-11 DIAGNOSIS — Z961 Presence of intraocular lens: Secondary | ICD-10-CM | POA: Diagnosis not present

## 2021-11-16 ENCOUNTER — Encounter: Payer: Self-pay | Admitting: Nurse Practitioner

## 2021-11-21 ENCOUNTER — Telehealth: Payer: Self-pay

## 2021-11-21 ENCOUNTER — Other Ambulatory Visit: Payer: Self-pay

## 2021-11-21 MED ORDER — NITROFURANTOIN MONOHYD MACRO 100 MG PO CAPS
100.0000 mg | ORAL_CAPSULE | Freq: Two times a day (BID) | ORAL | 0 refills | Status: DC
Start: 2021-11-21 — End: 2021-11-27

## 2021-11-21 NOTE — Telephone Encounter (Signed)
Pt called that she had UTI and she is unable to come in [place] and back pain as per alyssa pt advised hat we send macrobid and if she is not feeling better need appt  ?

## 2021-11-27 ENCOUNTER — Ambulatory Visit (INDEPENDENT_AMBULATORY_CARE_PROVIDER_SITE_OTHER): Payer: Medicare HMO | Admitting: Nurse Practitioner

## 2021-11-27 ENCOUNTER — Encounter: Payer: Self-pay | Admitting: Nurse Practitioner

## 2021-11-27 VITALS — BP 105/60 | HR 64 | Temp 98.6°F | Resp 16 | Ht 65.0 in | Wt 124.0 lb

## 2021-11-27 DIAGNOSIS — M160 Bilateral primary osteoarthritis of hip: Secondary | ICD-10-CM | POA: Diagnosis not present

## 2021-11-27 DIAGNOSIS — M5441 Lumbago with sciatica, right side: Secondary | ICD-10-CM | POA: Diagnosis not present

## 2021-11-27 DIAGNOSIS — R110 Nausea: Secondary | ICD-10-CM | POA: Diagnosis not present

## 2021-11-27 DIAGNOSIS — E538 Deficiency of other specified B group vitamins: Secondary | ICD-10-CM

## 2021-11-27 DIAGNOSIS — F411 Generalized anxiety disorder: Secondary | ICD-10-CM

## 2021-11-27 DIAGNOSIS — F331 Major depressive disorder, recurrent, moderate: Secondary | ICD-10-CM

## 2021-11-27 DIAGNOSIS — G8929 Other chronic pain: Secondary | ICD-10-CM

## 2021-11-27 MED ORDER — CYANOCOBALAMIN 1000 MCG/ML IJ SOLN
1000.0000 ug | Freq: Once | INTRAMUSCULAR | Status: DC
  Administered 2021-12-01: 1000 ug via INTRAMUSCULAR

## 2021-11-27 MED ORDER — ESCITALOPRAM OXALATE 20 MG PO TABS: 20.0000 mg | ORAL_TABLET | Freq: Every day | ORAL | 2 refills | Status: DC

## 2021-11-27 MED ORDER — CELECOXIB 50 MG PO CAPS: 50.0000 mg | ORAL_CAPSULE | Freq: Two times a day (BID) | ORAL | 2 refills | Status: DC

## 2021-11-27 MED ORDER — PROMETHAZINE HCL 12.5 MG PO TABS: 12.5000 mg | ORAL_TABLET | Freq: Four times a day (QID) | ORAL | 0 refills | Status: DC | PRN

## 2021-11-27 NOTE — Progress Notes (Signed)
[facility] [place], [place] [address]  Internal MEDICINE  Office Visit Note  Patient Name: Kristen Pennington  [id number]  [id number]  Date of Service: 11/27/2021  Chief Complaint  Patient presents with  . Follow-up    Muscle cramps in legs, pain in hips and legs, worse on right, has felt like this for a week   . Depression  . Gastroesophageal Reflux  . Hyperlipidemia  . Hypertension  . Headache    HPI Kristen Pennington presents for a follow-up visit for osteoarthritis, hypertension, headache, and depression.  Patient has chronic arthritis that she was taking meloxicam for but meloxicam made her feel nauseous and sick.  At her previous office visit meloxicam was discontinued and she was switched to diclofenac 50 mg twice a day but patient is now complaining of headache and nausea which are too common side effects of this medication.  She stopped taking medication 2 days ago and has started having low back pain with sciatica of the right leg which she states got worse in the past 2 to 3 days.  --Patient reports having nausea, heartburn decreased appetite related to the nausea and does have a prescription for Zofran but states that it did not help very much and would like to have a prescription for Phenergan. --Patient also reports that the problem she is having in the assisted living center that she lives in with the management is still ongoing and is wondering about Adult Protective Services or some other organizations that could help. --Patient reports that her depressive symptoms, depressed mood and anxiety have been worse and her current dose of Lexapro has not been adequate for controlling her depression and anxiety.  She is open to increasing the dose of Lexapro. Although patient reports she is not eating and she has decreased appetite, patient states she did eat an egg sandwich this morning.  Patient has lost a total of 3 pounds since her previous office visit but her  weight does tend to fluctuate    Current Medication: Facility-Administered Encounter Medications as of 11/27/2021  Medication  . cyanocobalamin ((VITAMIN B-12)) injection 1,000 mcg   Outpatient Encounter Medications as of 11/27/2021  Medication Sig  . acetaminophen (TYLENOL) 500 MG tablet Take 500 mg by mouth every 6 (six) hours as needed.  . bisoprolol-hydrochlorothiazide (ZIAC) 5-6.25 MG tablet Take 1 tablet by mouth daily.  . celecoxib (CELEBREX) 50 MG capsule Take 1 capsule (50 mg total) by mouth 2 (two) times daily. Take with food.  . Cholecalciferol (VITAMIN D3) 5000 units TABS Take 5,000 Units by mouth daily.  . cyclobenzaprine (FLEXERIL) 10 MG tablet TAKE 1 TABLET BY MOUTH AT BEDTIME FOR BACK SPASMS AS NEEDED ONLY.  Marland Kitchen escitalopram (LEXAPRO) 20 MG tablet Take 1 tablet (20 mg total) by mouth daily.  Marland Kitchen estradiol (ESTRACE) 0.1 MG/GM vaginal cream Use once a week as needed  . fluticasone (FLONASE) 50 MCG/ACT nasal spray 1 SPRAY IN EACH NOSTRIL ONCE DAILY AS NEEDED  . furosemide (LASIX) 20 MG tablet Take 1 tablet (20 mg total) by mouth daily.  . Liniments (S[place] PAIN RELIEF PATCH EX) Apply 1 patch topically 2 (two) times daily.  Marland Kitchen lubiprostone (AMITIZA) 8 MCG capsule TAKE 1 CAPSULE BY MOUTH ONCE DAILY FOR CONSTIPATION  . montelukast (SINGULAIR) 10 MG tablet Take 1 tablet (10 mg total) by mouth at bedtime.  . Multiple Vitamins-Minerals (CENTRUM SILVER 50+WOMEN) TABS Take by mouth.  . ondansetron (ZOFRAN) 4 MG tablet Take 1 tablet (4 mg total)  by mouth every 8 (eight) hours as needed for nausea or vomiting.  Vladimir Faster Glycol-Propyl Glycol (SYSTANE) 0.4-0.3 % SOLN Apply to eye.  . polyethylene glycol powder (GLYCOLAX/MIRALAX) 17 GM/SCOOP powder Take 17 g by mouth daily as needed for mild constipation or moderate constipation.  . promethazine (PHENERGAN) 12.5 MG tablet Take 1 tablet (12.5 mg total) by mouth every 6 (six) hours as needed for nausea or vomiting.  Marland Kitchen rOPINIRole (REQUIP) 1 MG  tablet Take 1 tablet (1 mg total) by mouth 3 (three) times daily as needed (restless legs).  . traMADol (ULTRAM) 50 MG tablet TAKE ONE TABLET TWICE A DAY IF NEEDED FOR SEVERE PAIN  . traZODone (DESYREL) 50 MG tablet Take 0.5-1 tablets (25-50 mg total) by mouth at bedtime.  . [DISCONTINUED] diclofenac (VOLTAREN) 50 MG EC tablet Take 1 tablet (50 mg total) by mouth 2 (two) times daily.  . [DISCONTINUED] escitalopram (LEXAPRO) 10 MG tablet Take 1 tablet (10 mg total) by mouth daily.  . [DISCONTINUED] nitrofurantoin, macrocrystal-monohydrate, (MACROBID) 100 MG capsule Take 1 capsule (100 mg total) by mouth 2 (two) times daily.    Surgical History: Past Surgical History:  Procedure Laterality Date  . COLONOSCOPY  05-03-15  . COLONOSCOPY WITH PROPOFOL N/A 05/03/2015   Procedure: COLONOSCOPY WITH PROPOFOL;  Surgeon: Lucilla Lame, MD;  Location: [place];  Service: Endoscopy;  Laterality: N/A;  CPAP  . DILATION AND CURETTAGE OF UTERUS  1970  . ENDOMETRIAL BIOPSY    . EYE SURGERY Right 2017  . POLYPECTOMY  05/03/2015   Procedure: POLYPECTOMY;  Surgeon: Lucilla Lame, MD;  Location: [place];  Service: Endoscopy;;  . ROTATOR CUFF REPAIR  2007  . SPINE SURGERY      Medical History: Past Medical History:  Diagnosis Date  . Arthritis   . Atrophic vaginitis 03/06/2013   Last Assessment & Plan:  She will plan to continue estradiol vaginal cream.  Prescription was rewritten stipulating use of the generic product.   . Atypical migraine 03/06/2013   Last Assessment & Plan:  Since she rarely takes sumatriptan, we will discontinue it. Continue sparing use of OTC migraine products.   . Avitaminosis D 03/06/2013   Last Assessment & Plan:  Recheck vitamin D level. Plan to continue vitamin D supplementation.   . Ceratitis 08/01/2013   Last Assessment & Plan:  Because of the expense, she will discontinue Restasis. She will use over-the-counter moisturizing eyedrops and liquid gel.   . Colon  polyp   . 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   . 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   . Cystocele, midline 03/06/2013  . Demoralization and apathy 03/06/2013   Last Assessment & Plan:  Since she has been taking bupropion only once a day, I will write the prescription with the correct instructions and correct quantity. She has done well on this for years and she is encouraged to take it regularly   . Depression   . Environmental allergies   . Epistaxis   . Essential (primary) hypertension 05/17/2013   Last Assessment & Plan:  Her blood pressure is well-controlled. We will plan to continue hydrochlorothiazide and benazepril.  Both are generic and should be affordable on her health plan.   Marland Kitchen GERD (gastroesophageal reflux disease)   . Hemorrhoid   . Inflammation of sacroiliac  joint ([place]) 03/06/2013   Last Assessment & Plan:  She is doing well on her present regimen of fentanyl patch supplemented with sparing use of tramadol/acetaminophen. We will continue that regimen.   . Sinusitis   . Sleep apnea    CPAP    Family History: Family History  Problem Relation Age of Onset  . Arthritis Mother   . Alzheimer's disease Father   . Heart disease Sister   . Cancer Brother   . Ovarian cancer Neg Hx   . Breast cancer Neg Hx   . Colon cancer Neg Hx   . Diabetes Neg Hx     Social History   Socioeconomic History  . Marital status: Widowed    Spouse name: Not on file  . Number of children: 2  . Years of education: Not on file  . Highest education level: Some college, no degree  Occupational History  . Not on file  Tobacco Use  . Smoking status: Never  . Smokeless tobacco: Never  Vaping Use  . Vaping Use: Never used  Substance and Sexual Activity  . Alcohol use: No    Alcohol/week: 0.0 standard  drinks  . Drug use: No  . Sexual activity: Not Currently  Other Topics Concern  . Not on file  Social History Narrative  . Not on file   Social Determinants of Health   Financial Resource Strain: Not on file  Food Insecurity: No Food Insecurity  . Worried About Charity fundraiser in the Last Year: Never true  . Ran Out of Food in the Last Year: Never true  Transportation Needs: No Transportation Needs  . Lack of Transportation (Medical): No  . Lack of Transportation (Non-Medical): No  Physical Activity: Not on file  Stress: Not on file  Social Connections: Not on file  Intimate Partner Violence: Not on file      Review of Systems  Constitutional:  Positive for appetite change. Negative for chills, fatigue and unexpected weight change.  HENT:  Negative for congestion, rhinorrhea, sneezing and sore throat.   Eyes:  Negative for redness.  Respiratory: Negative.  Negative for cough, chest tightness, shortness of breath and wheezing.   Cardiovascular: Negative.  Negative for chest pain and palpitations.  Gastrointestinal:  Positive for nausea. Negative for abdominal pain, constipation, diarrhea and vomiting.  Genitourinary:  Negative for dysuria and frequency.  Musculoskeletal:  Positive for arthralgias and back pain. Negative for joint swelling and neck pain.  Skin:  Negative for rash.  Neurological:  Positive for headaches. Negative for tremors and numbness.  Hematological:  Negative for adenopathy. Does not bruise/bleed easily.  Psychiatric/Behavioral:  Positive for behavioral problems (Depression) and sleep disturbance. Negative for suicidal ideas. The patient is nervous/anxious.    Vital Signs: BP 105/60   Pulse 64   Temp 98.6 F (37 C)   Resp 16   Ht '5\' 5"'$  (1.651 m)   Wt 124 lb (56.2 kg)   SpO2 97%   BMI 20.63 kg/m    Physical Exam Vitals reviewed.  Constitutional:      General: She is not in acute distress.    Appearance: She is well-developed and normal  weight. She is not ill-appearing.  HENT:     Head: Normocephalic and atraumatic.  Eyes:     Pupils: Pupils are equal, round, and reactive to light.  Cardiovascular:     Rate and Rhythm: Normal rate and regular rhythm.  Pulmonary:     Effort: Pulmonary effort is normal.  No respiratory distress.  Neurological:     Mental Status: She is alert and oriented to person, place, and time.  Psychiatric:        Mood and Affect: Mood normal.        Behavior: Behavior normal.       Assessment/Plan: 1. Chronic bilateral low back pain with right-sided sciatica Patient has tried meloxicam and diclofenac but both medications have caused nausea and headache. Celebrex prescribed at a low dose for patient to try to help alleviate chronic arthritic pain of multiple joints, especially her back. Will follow up in 1 month. - celecoxib (CELEBREX) 50 MG capsule; Take 1 capsule (50 mg total) by mouth 2 (two) times daily. Take with food.  Dispense: 60 capsule; Refill: 2  2. Osteoarthritis of hips (Bilateral) Please see problem #1 - celecoxib (CELEBREX) 50 MG capsule; Take 1 capsule (50 mg total) by mouth 2 (two) times daily. Take with food.  Dispense: 60 capsule; Refill: 2  3. Nausea Zofran is not helping, phenergan prescribed at a low dose, patient has had this medication before.  - promethazine (PHENERGAN) 12.5 MG tablet; Take 1 tablet (12.5 mg total) by mouth every 6 (six) hours as needed for nausea or vomiting.  Dispense: 30 tablet; Refill: 0  4. B12 deficiency B12 injection administered by CMA during office visit, may receive monthly B12 injections if desired. - cyanocobalamin ((VITAMIN B-12)) injection 1,000 mcg  5. Generalized anxiety disorder Patient feels that her anxiety and depressive symptoms are not under control and is agreeable to increasing her lexapro dose. She has been on 20 mg of lexapro daily earlier last year. Will follow up in 1 month.  - escitalopram (LEXAPRO) 20 MG tablet; Take 1  tablet (20 mg total) by mouth daily.  Dispense: 30 tablet; Refill: 2  6. Moderate episode of recurrent major depressive disorder ([place]) See problem #5 - escitalopram (LEXAPRO) 20 MG tablet; Take 1 tablet (20 mg total) by mouth daily.  Dispense: 30 tablet; Refill: 2   General Counseling: marianne golightly understanding of the findings of todays visit and agrees with plan of treatment. I have discussed any further diagnostic evaluation that may be needed or ordered today. We also reviewed her medications today. she has been encouraged to call the office with any questions or concerns that should arise related to todays visit.    No orders of the defined types were placed in this encounter.   Meds ordered this encounter  Medications  . cyanocobalamin ((VITAMIN B-12)) injection 1,000 mcg  . promethazine (PHENERGAN) 12.5 MG tablet    Sig: Take 1 tablet (12.5 mg total) by mouth every 6 (six) hours as needed for nausea or vomiting.    Dispense:  30 tablet    Refill:  0  . celecoxib (CELEBREX) 50 MG capsule    Sig: Take 1 capsule (50 mg total) by mouth 2 (two) times daily. Take with food.    Dispense:  60 capsule    Refill:  2    Discontinue diclofenac, patient is switching to celebrex  . escitalopram (LEXAPRO) 20 MG tablet    Sig: Take 1 tablet (20 mg total) by mouth daily.    Dispense:  30 tablet    Refill:  2    Note increased dose, discontinue 10 mg tablet.    Return in about 1 month (around 12/28/2021) for F/U, eval new med, [place] PCP.   Total time spent:30 Minutes Time spent includes review of chart, medications, test results, and follow  up plan with the patient.   [place] Controlled Substance Database was reviewed by me.  This patient was seen by Jonetta Osgood, FNP-C in collaboration with Dr. Clayborn Bigness as a part of collaborative care agreement.   Niza Soderholm R. Valetta Fuller, MSN, FNP-C Internal medicine

## 2021-11-28 ENCOUNTER — Other Ambulatory Visit: Payer: Self-pay | Admitting: *Deleted

## 2021-11-28 NOTE — Patient Outreach (Signed)
[facility]) Care Management Telephonic RN Care Manager Note   11/28/2021 Name:  Kristen Pennington MRN:  [id number] DOB:  09-18-1928  Summary: Update received with recent blood pressure 105/52  however pt having some symptoms of nausea. Aware to take the new medications prescribed to the pt. Pt visited her primary on yesterday to address her ongoing symptoms.  Recommendations/Changes made from today's visit: Will continue to reiterate on the plan of care and encouraged ongoing monitoring of her blood pressures. Pt aware of acute readings and when to call her provider. Will continue to encouraged adherence with all medications and medical appointments.  Subjective: Kristen Pennington is an 88 y.o. year old female who is a primary patient of Jonetta Osgood, NP. The care management team was consulted for assistance with care management and/or care coordination needs.    Telephonic RN Care Manager completed Telephone Visit today.  Objective:   Medications Reviewed Today     Reviewed by Jimmye Norman, CMA (Certified Medical Assistant) on 11/27/21 at [phone]  Med List Status: <None>   Medication Order Taking? Sig Documenting Provider Last Dose Status Informant  acetaminophen (TYLENOL) 500 MG tablet [id number]  Take 500 mg by mouth every 6 (six) hours as needed. [provider]  Active   bisoprolol-hydrochlorothiazide [facility]) 5-6.25 MG tablet [id number]  Take 1 tablet by mouth daily. Jonetta Osgood, NP  Active   Cholecalciferol (VITAMIN D3) 5000 units TABS [id number]  Take 5,000 Units by mouth daily. [provider]  Active   cyclobenzaprine (FLEXERIL) 10 MG tablet [id number]  TAKE 1 TABLET BY MOUTH AT BEDTIME FOR BACK SPASMS AS NEEDED ONLY. Lavera Guise, MD  Active   diclofenac (VOLTAREN) 50 MG EC tablet [id number]  Take 1 tablet (50 mg total) by mouth 2 (two) times daily. Jonetta Osgood, NP  Active   escitalopram (LEXAPRO) 10 MG tablet [id number]   Take 1 tablet (10 mg total) by mouth daily. Jonetta Osgood, NP  Active   estradiol (ESTRACE) 0.1 MG/GM vaginal cream [id number]  Use once a week as needed Jonetta Osgood, NP  Active   fluticasone (FLONASE) 50 MCG/ACT nasal spray [id number]  1 SPRAY IN EACH NOSTRIL ONCE DAILY AS NEEDED Abernathy, Alyssa, NP  Active   furosemide (LASIX) 20 MG tablet [id number]  Take 1 tablet (20 mg total) by mouth daily. Jonetta Osgood, NP  Active   Liniments [facility] PAIN RELIEF PATCH EX) [id number]  Apply 1 patch topically 2 (two) times daily. [provider]  Active   lubiprostone (AMITIZA) 8 MCG capsule [id number]  TAKE 1 CAPSULE BY MOUTH ONCE DAILY FOR CONSTIPATION Abernathy, Alyssa, NP  Active   montelukast (SINGULAIR) 10 MG tablet [id number]  Take 1 tablet (10 mg total) by mouth at bedtime. Jonetta Osgood, NP  Active   Multiple Vitamins-Minerals (CENTRUM SILVER 50+WOMEN) TABS [id number]  Take by mouth. [provider]  Active   nitrofurantoin, macrocrystal-monohydrate, (MACROBID) 100 MG capsule [id number]  Take 1 capsule (100 mg total) by mouth 2 (two) times daily. Jonetta Osgood, NP  Active   ondansetron (ZOFRAN) 4 MG tablet [id number]  Take 1 tablet (4 mg total) by mouth every 8 (eight) hours as needed for nausea or vomiting. Jonetta Osgood, NP  Active   Polyethyl Glycol-Propyl Glycol (SYSTANE) 0.4-0.3 % SOLN [id number]  Apply to eye. [provider]  Active   polyethylene glycol powder (GLYCOLAX/MIRALAX) 17 GM/SCOOP powder [id number]  Take 17 g by mouth daily as needed for mild constipation or moderate constipation. Abernathy,  Alyssa, NP  Active   rOPINIRole (REQUIP) 1 MG tablet [id number]  Take 1 tablet (1 mg total) by mouth 3 (three) times daily as needed (restless legs). Abernathy, Alyssa, NP  Active   traMADol (ULTRAM) 50 MG tablet [id number]  TAKE ONE TABLET TWICE A DAY IF NEEDED FOR SEVERE PAIN Abernathy, Alyssa, NP  Active   traZODone (DESYREL) 50 MG tablet [id number]   Take 0.5-1 tablets (25-50 mg total) by mouth at bedtime. Abernathy, Alyssa, NP  Active              SDOH:  (Social Determinants of Health) assessments and interventions performed:     Care Plan  Review of patient past medical history, allergies, medications, health status, including review of consultants reports, laboratory and other test data, was performed as part of comprehensive evaluation for care management services.   Care Plan : RN Care Manager plan of care  Updates made by Andri Prestia D, RN since 11/28/2021 12:00 AM     Problem: Knowledge Deficit related to HTN and care coordination needs   Priority: High     Long-Range Goal: Development plan of care for management of HTN   Start Date: 10/30/2021  Expected End Date: 05/12/2022  This Visit's Progress: On track  Priority: High  Note:   Current Barriers:  Knowledge Deficits related to plan of care for management of HTN   RNCM Clinical Goal(s):  Patient will verbalize basic understanding of  HTN disease process and self health management plan as evidenced by Self reporting take all medications exactly as prescribed and will call provider for medication related questions as evidenced by chart review and self reporting  through collaboration with RN Care manager, provider, and care team.   Interventions: Inter-disciplinary care team collaboration (see longitudinal plan of care) Evaluation of current treatment plan related to  self management and patient's adherence to plan as established by provider   Hypertension Interventions:  (Status:  New goal.) Long Term Goal Last practice recorded BP readings:  BP Readings from Last 3 Encounters:  10/29/21 140/75  09/29/21 140/70  09/08/21 140/80  Most recent eGFR/CrCl:  Lab Results  Component Value Date   EGFR 51 (L) 09/22/2021    No components found for: CRCL  Evaluation of current treatment plan related to hypertension self management and patient's adherence to  plan as established by provider Provided education to patient re: stroke prevention, s/s of heart attack and stroke Reviewed medications with patient and discussed importance of compliance Counseled on adverse effects of illicit drug and excessive alcohol use in patients with high blood pressure  Provided assistance with obtaining home blood pressure monitor via [facility] provision; Discussed plans with patient for ongoing care management follow up and provided patient with direct contact information for care management team Discussed complications of poorly controlled blood pressure such as heart disease, stroke, circulatory complications, vision complications, kidney impairment, sexual dysfunction Screening for signs and symptoms of depression related to chronic disease state  Assessed social determinant of health barriers  11/28/2021 Update: Pt reports not feeling to well but she has consulted with her provider on yesterday and provided a prescriptions to assist with her ongoing nausea.  RN attempted to gather additional information to intervene. Pt states she'll be okay and her daughter Cindy is going to be tomorrow or Sunday. RN offered to call concerning how she was feeling today however pt decline and will awake her arrival this weekend. No acute issues just again not feeling well as pt   cancelled family function with no visitor due to her illness but again improved slowly. Offered to follow up in a few weeks to inquire and encouraged the pt to call the [facility] nurse hotline if needed for any issues over the weekend or after RN case manager's office hours ( pt receptive and indicated she would do if needed).  Reports her most recent blood pressure at 105/52 with no specific symptoms other then sore throat and some ongoing nausea. Reiterated on the plan of care and will continue to encouraged adherence with her ongoing monitoring. Review medications with no recent changes noted other then the nausea  medication.  Patient Goals/Self-Care Activities: Take all medications as prescribed Attend all scheduled provider appointments Call pharmacy for medication refills 3-7 days in advance of running out of medications Attend church or other social activities Perform all self care activities independently  Perform IADL's (shopping, preparing meals, housekeeping, managing finances) independently Call provider office for new concerns or questions  check blood pressure 3 times per week choose a place to take my blood pressure (home, clinic or office, retail store) write blood pressure results in a log or diary learn about high blood pressure keep a blood pressure log take blood pressure log to all doctor appointments call doctor for signs and symptoms of high blood pressure keep all doctor appointments take medications for blood pressure exactly as prescribed report new symptoms to your doctor  Follow Up Plan:  Telephone follow up appointment with care management team member scheduled for:  June 2023 The patient has been provided with contact information for the care management team and has been advised to call with any health related questions or concerns.         , RN Care Management Coordinator [facility] Main Office [phone]    

## 2021-11-30 ENCOUNTER — Emergency Department: Payer: Medicare HMO

## 2021-11-30 ENCOUNTER — Observation Stay: Payer: Medicare HMO

## 2021-11-30 ENCOUNTER — Encounter: Payer: Self-pay | Admitting: Internal Medicine

## 2021-11-30 ENCOUNTER — Encounter: Payer: Self-pay | Admitting: Nurse Practitioner

## 2021-11-30 ENCOUNTER — Telehealth: Payer: Self-pay

## 2021-11-30 ENCOUNTER — Other Ambulatory Visit: Payer: Self-pay

## 2021-11-30 ENCOUNTER — Inpatient Hospital Stay
Admission: EM | Admit: 2021-11-30 | Discharge: 2021-12-03 | DRG: 193 | Disposition: A | Discharge status: Home / Self Care | Payer: Medicare HMO | Attending: Obstetrics and Gynecology | Admitting: Obstetrics and Gynecology

## 2021-11-30 DIAGNOSIS — E785 Hyperlipidemia, unspecified: Secondary | ICD-10-CM | POA: Diagnosis present

## 2021-11-30 DIAGNOSIS — Z86010 Personal history of colonic polyps: Secondary | ICD-10-CM

## 2021-11-30 DIAGNOSIS — R7989 Other specified abnormal findings of blood chemistry: Secondary | ICD-10-CM | POA: Diagnosis present

## 2021-11-30 DIAGNOSIS — R0602 Shortness of breath: Secondary | ICD-10-CM | POA: Diagnosis not present

## 2021-11-30 DIAGNOSIS — I10 Essential (primary) hypertension: Secondary | ICD-10-CM | POA: Diagnosis present

## 2021-11-30 DIAGNOSIS — R54 Age-related physical debility: Secondary | ICD-10-CM | POA: Diagnosis not present

## 2021-11-30 DIAGNOSIS — G4733 Obstructive sleep apnea (adult) (pediatric): Secondary | ICD-10-CM | POA: Diagnosis not present

## 2021-11-30 DIAGNOSIS — F411 Generalized anxiety disorder: Secondary | ICD-10-CM | POA: Diagnosis present

## 2021-11-30 DIAGNOSIS — N179 Acute kidney failure, unspecified: Secondary | ICD-10-CM | POA: Diagnosis not present

## 2021-11-30 DIAGNOSIS — M545 Low back pain, unspecified: Secondary | ICD-10-CM | POA: Diagnosis present

## 2021-11-30 DIAGNOSIS — J9601 Acute respiratory failure with hypoxia: Secondary | ICD-10-CM | POA: Diagnosis present

## 2021-11-30 DIAGNOSIS — E876 Hypokalemia: Secondary | ICD-10-CM | POA: Diagnosis present

## 2021-11-30 DIAGNOSIS — I129 Hypertensive chronic kidney disease with stage 1 through stage 4 chronic kidney disease, or unspecified chronic kidney disease: Secondary | ICD-10-CM | POA: Diagnosis not present

## 2021-11-30 DIAGNOSIS — G894 Chronic pain syndrome: Secondary | ICD-10-CM | POA: Diagnosis present

## 2021-11-30 DIAGNOSIS — M160 Bilateral primary osteoarthritis of hip: Secondary | ICD-10-CM | POA: Diagnosis present

## 2021-11-30 DIAGNOSIS — K5900 Constipation, unspecified: Secondary | ICD-10-CM | POA: Diagnosis present

## 2021-11-30 DIAGNOSIS — Z1152 Encounter for screening for COVID-19: Secondary | ICD-10-CM | POA: Diagnosis not present

## 2021-11-30 DIAGNOSIS — R0902 Hypoxemia: Secondary | ICD-10-CM | POA: Diagnosis not present

## 2021-11-30 DIAGNOSIS — R21 Rash and other nonspecific skin eruption: Secondary | ICD-10-CM | POA: Diagnosis not present

## 2021-11-30 DIAGNOSIS — K219 Gastro-esophageal reflux disease without esophagitis: Secondary | ICD-10-CM | POA: Diagnosis not present

## 2021-11-30 DIAGNOSIS — E559 Vitamin D deficiency, unspecified: Secondary | ICD-10-CM | POA: Diagnosis present

## 2021-11-30 DIAGNOSIS — Z79899 Other long term (current) drug therapy: Secondary | ICD-10-CM

## 2021-11-30 DIAGNOSIS — F32A Depression, unspecified: Secondary | ICD-10-CM | POA: Diagnosis not present

## 2021-11-30 DIAGNOSIS — Z87440 Personal history of urinary (tract) infections: Secondary | ICD-10-CM | POA: Diagnosis not present

## 2021-11-30 DIAGNOSIS — R110 Nausea: Secondary | ICD-10-CM | POA: Diagnosis not present

## 2021-11-30 DIAGNOSIS — N1831 Chronic kidney disease, stage 3a: Secondary | ICD-10-CM | POA: Diagnosis not present

## 2021-11-30 DIAGNOSIS — J189 Pneumonia, unspecified organism: Principal | ICD-10-CM | POA: Diagnosis present

## 2021-11-30 DIAGNOSIS — L270 Generalized skin eruption due to drugs and medicaments taken internally: Principal | ICD-10-CM

## 2021-11-30 DIAGNOSIS — Z66 Do not resuscitate: Secondary | ICD-10-CM | POA: Diagnosis present

## 2021-11-30 DIAGNOSIS — Z791 Long term (current) use of non-steroidal anti-inflammatories (NSAID): Secondary | ICD-10-CM

## 2021-11-30 DIAGNOSIS — G8929 Other chronic pain: Secondary | ICD-10-CM | POA: Diagnosis present

## 2021-11-30 DIAGNOSIS — I5031 Acute diastolic (congestive) heart failure: Secondary | ICD-10-CM | POA: Diagnosis not present

## 2021-11-30 DIAGNOSIS — Z8249 Family history of ischemic heart disease and other diseases of the circulatory system: Secondary | ICD-10-CM

## 2021-11-30 DIAGNOSIS — F418 Other specified anxiety disorders: Secondary | ICD-10-CM | POA: Diagnosis present

## 2021-11-30 DIAGNOSIS — Z820 Family history of epilepsy and other diseases of the nervous system: Secondary | ICD-10-CM

## 2021-11-30 DIAGNOSIS — R945 Abnormal results of liver function studies: Secondary | ICD-10-CM | POA: Diagnosis not present

## 2021-11-30 DIAGNOSIS — Z8261 Family history of arthritis: Secondary | ICD-10-CM

## 2021-11-30 DIAGNOSIS — Z743 Need for continuous supervision: Secondary | ICD-10-CM | POA: Diagnosis not present

## 2021-11-30 HISTORY — DX: Pneumonia, unspecified organism: J18.9

## 2021-11-30 LAB — PROTIME-INR
INR: 1.1 (ref 0.8–1.2)
Prothrombin Time: 13.7 seconds (ref 11.4–15.2)

## 2021-11-30 LAB — C-REACTIVE PROTEIN: CRP: 7.3 mg/dL — ABNORMAL HIGH (ref <1.0)

## 2021-11-30 LAB — CBC
HCT: 40.2 % (ref 36.0–46.0)
Hemoglobin: 12.8 g/dL (ref 12.0–15.0)
MCH: 30.4 pg (ref 26.0–34.0)
MCHC: 31.8 g/dL (ref 30.0–36.0)
MCV: 95.5 fL (ref 80.0–100.0)
Platelets: 245 10*3/uL (ref 150–400)
RBC: 4.21 MIL/uL (ref 3.87–5.11)
RDW: 12.6 % (ref 11.5–15.5)
WBC: 9.7 10*3/uL (ref 4.0–10.5)
nRBC: 0 % (ref 0.0–0.2)

## 2021-11-30 LAB — BASIC METABOLIC PANEL
Anion gap: 10 (ref 5–15)
BUN: 39 mg/dL — ABNORMAL HIGH (ref 8–23)
CO2: 32 mmol/L (ref 22–32)
Calcium: 9.6 mg/dL (ref 8.9–10.3)
Chloride: 97 mmol/L — ABNORMAL LOW (ref 98–111)
Creatinine, Ser: 1.37 mg/dL — ABNORMAL HIGH (ref 0.44–1.00)
GFR, Estimated: 37 mL/min — ABNORMAL LOW (ref >60)
Glucose, Bld: 95 mg/dL (ref 70–99)
Potassium: 3.1 mmol/L — ABNORMAL LOW (ref 3.5–5.1)
Sodium: 139 mmol/L (ref 135–145)

## 2021-11-30 LAB — RESP PANEL BY RT-PCR (FLU A&B, COVID) ARPGX2
Influenza A by PCR: NEGATIVE
Influenza B by PCR: NEGATIVE
SARS Coronavirus 2 by RT PCR: NEGATIVE

## 2021-11-30 LAB — TROPONIN I (HIGH SENSITIVITY): Troponin I (High Sensitivity): 6 ng/L (ref <18)

## 2021-11-30 LAB — HEPATIC FUNCTION PANEL
ALT: 90 U/L — ABNORMAL HIGH (ref 0–44)
AST: 156 U/L — ABNORMAL HIGH (ref 15–41)
Albumin: 3.5 g/dL (ref 3.5–5.0)
Alkaline Phosphatase: 67 U/L (ref 38–126)
Bilirubin, Direct: 0.4 mg/dL — ABNORMAL HIGH (ref 0.0–0.2)
Indirect Bilirubin: 0.9 mg/dL (ref 0.3–0.9)
Total Bilirubin: 1.3 mg/dL — ABNORMAL HIGH (ref 0.3–1.2)
Total Protein: 6.7 g/dL (ref 6.5–8.1)

## 2021-11-30 LAB — URINALYSIS, ROUTINE W REFLEX MICROSCOPIC
Bilirubin Urine: NEGATIVE
Glucose, UA: NEGATIVE mg/dL
Hgb urine dipstick: NEGATIVE
Ketones, ur: NEGATIVE mg/dL
Leukocytes,Ua: NEGATIVE
Nitrite: NEGATIVE
Protein, ur: NEGATIVE mg/dL
Specific Gravity, Urine: 1.009 (ref 1.005–1.030)
pH: 5 (ref 5.0–8.0)

## 2021-11-30 LAB — BRAIN NATRIURETIC PEPTIDE: B Natriuretic Peptide: 179.2 pg/mL — ABNORMAL HIGH (ref 0.0–100.0)

## 2021-11-30 LAB — PROCALCITONIN: Procalcitonin: 12.36 ng/mL

## 2021-11-30 LAB — SEDIMENTATION RATE: Sed Rate: 44 mm/hr — ABNORMAL HIGH (ref 0–30)

## 2021-11-30 LAB — MAGNESIUM: Magnesium: 2.1 mg/dL (ref 1.7–2.4)

## 2021-11-30 MED ORDER — PREDNISONE 20 MG PO TABS
20.0000 mg | ORAL_TABLET | Freq: Every day | ORAL | Status: DC
  Administered 2021-11-30 – 2021-12-03 (×4): 20 mg via ORAL
  Filled 2021-11-30 (×4): qty 1

## 2021-11-30 MED ORDER — VITAMIN D 25 MCG (1000 UNIT) PO TABS
5000.0000 [IU] | ORAL_TABLET | Freq: Every day | ORAL | Status: DC
  Administered 2021-12-01 – 2021-12-03 (×3): 5000 [IU] via ORAL
  Filled 2021-11-30 (×3): qty 5

## 2021-11-30 MED ORDER — AZITHROMYCIN 500 MG IV SOLR
500.0000 mg | Freq: Once | INTRAVENOUS | Status: AC
  Administered 2021-11-30: 500 mg via INTRAVENOUS
  Filled 2021-11-30: qty 5

## 2021-11-30 MED ORDER — POLYETHYLENE GLYCOL 3350 17 GM/SCOOP PO POWD: 17.0000 g | Freq: Every day | ORAL | Status: DC | PRN

## 2021-11-30 MED ORDER — ADULT MULTIVITAMIN W/MINERALS CH
1.0000 | ORAL_TABLET | Freq: Every day | ORAL | Status: DC
  Administered 2021-11-30 – 2021-12-02 (×3): 1 via ORAL
  Filled 2021-11-30 (×3): qty 1

## 2021-11-30 MED ORDER — ONDANSETRON HCL 4 MG/2ML IJ SOLN: 4.0000 mg | Freq: Three times a day (TID) | INTRAMUSCULAR | Status: DC | PRN

## 2021-11-30 MED ORDER — DIPHENHYDRAMINE HCL 25 MG PO CAPS: 25.0000 mg | ORAL_CAPSULE | Freq: Three times a day (TID) | ORAL | Status: DC | PRN

## 2021-11-30 MED ORDER — POTASSIUM CHLORIDE CRYS ER 20 MEQ PO TBCR
40.0000 meq | EXTENDED_RELEASE_TABLET | Freq: Once | ORAL | Status: AC
Start: 2021-11-30 — End: 2021-11-30
  Administered 2021-11-30: 40 meq via ORAL
  Filled 2021-11-30: qty 2

## 2021-11-30 MED ORDER — CYCLOBENZAPRINE HCL 10 MG PO TABS
10.0000 mg | ORAL_TABLET | Freq: Every evening | ORAL | Status: DC | PRN
  Administered 2021-12-01: 10 mg via ORAL
  Filled 2021-11-30 (×2): qty 1

## 2021-11-30 MED ORDER — MONTELUKAST SODIUM 10 MG PO TABS
10.0000 mg | ORAL_TABLET | Freq: Every day | ORAL | Status: DC
  Administered 2021-11-30 – 2021-12-02 (×3): 10 mg via ORAL
  Filled 2021-11-30 (×3): qty 1

## 2021-11-30 MED ORDER — CEFTRIAXONE SODIUM 1 G IJ SOLR
1.0000 g | INTRAMUSCULAR | Status: DC
  Administered 2021-11-30 – 2021-12-01 (×2): 1 g via INTRAVENOUS
  Filled 2021-11-30: qty 1
  Filled 2021-11-30: qty 10
  Filled 2021-11-30: qty 1

## 2021-11-30 MED ORDER — ENOXAPARIN SODIUM 30 MG/0.3ML IJ SOSY
30.0000 mg | PREFILLED_SYRINGE | INTRAMUSCULAR | Status: DC
  Administered 2021-11-30 – 2021-12-02 (×3): 30 mg via SUBCUTANEOUS
  Filled 2021-11-30 (×3): qty 0.3

## 2021-11-30 MED ORDER — LUBIPROSTONE 8 MCG PO CAPS
8.0000 ug | ORAL_CAPSULE | Freq: Every day | ORAL | Status: DC
  Administered 2021-12-01 – 2021-12-03 (×3): 8 ug via ORAL
  Filled 2021-11-30 (×3): qty 1

## 2021-11-30 MED ORDER — TRAZODONE HCL 50 MG PO TABS
25.0000 mg | ORAL_TABLET | Freq: Every day | ORAL | Status: DC
  Administered 2021-11-30 – 2021-12-02 (×3): 50 mg via ORAL
  Filled 2021-11-30 (×2): qty 1
  Filled 2021-11-30: qty 2
  Filled 2021-11-30: qty 1

## 2021-11-30 MED ORDER — ALBUTEROL SULFATE (2.5 MG/3ML) 0.083% IN NEBU
3.0000 mL | INHALATION_SOLUTION | RESPIRATORY_TRACT | Status: DC | PRN
Start: 2021-11-30 — End: 2021-12-03

## 2021-11-30 MED ORDER — FAMOTIDINE 20 MG PO TABS
20.0000 mg | ORAL_TABLET | Freq: Every day | ORAL | Status: DC
Start: 2021-11-30 — End: 2021-11-30
  Administered 2021-11-30: 20 mg via ORAL
  Filled 2021-11-30: qty 1

## 2021-11-30 MED ORDER — FUROSEMIDE 10 MG/ML IJ SOLN: 40.0000 mg | Freq: Once | INTRAMUSCULAR | Status: DC

## 2021-11-30 MED ORDER — SODIUM CHLORIDE 0.9 % IV SOLN
500.0000 mg | INTRAVENOUS | Status: DC
  Administered 2021-12-01: 500 mg via INTRAVENOUS
  Filled 2021-11-30: qty 500
  Filled 2021-11-30: qty 5

## 2021-11-30 MED ORDER — POLYVINYL ALCOHOL 1.4 % OP SOLN: 1.0000 [drp] | Freq: Two times a day (BID) | OPHTHALMIC | Status: DC | PRN

## 2021-11-30 MED ORDER — HYDRALAZINE HCL 20 MG/ML IJ SOLN: 5.0000 mg | INTRAMUSCULAR | Status: DC | PRN

## 2021-11-30 MED ORDER — TRAMADOL HCL 50 MG PO TABS
50.0000 mg | ORAL_TABLET | Freq: Four times a day (QID) | ORAL | Status: DC | PRN
  Administered 2021-11-30 – 2021-12-03 (×6): 50 mg via ORAL
  Filled 2021-11-30 (×6): qty 1

## 2021-11-30 MED ORDER — FLUTICASONE PROPIONATE 50 MCG/ACT NA SUSP
1.0000 | Freq: Every day | NASAL | Status: DC | PRN
  Filled 2021-11-30: qty 16

## 2021-11-30 MED ORDER — BISOPROLOL FUMARATE 5 MG PO TABS
5.0000 mg | ORAL_TABLET | Freq: Every day | ORAL | Status: DC
  Administered 2021-12-01 – 2021-12-03 (×3): 5 mg via ORAL
  Filled 2021-11-30 (×3): qty 1

## 2021-11-30 MED ORDER — ENOXAPARIN SODIUM 40 MG/0.4ML IJ SOSY: 40.0000 mg | PREFILLED_SYRINGE | INTRAMUSCULAR | Status: DC

## 2021-11-30 MED ORDER — ALBUTEROL SULFATE (2.5 MG/3ML) 0.083% IN NEBU
2.5000 mg | INHALATION_SOLUTION | Freq: Once | RESPIRATORY_TRACT | Status: AC
  Administered 2021-11-30: 2.5 mg via RESPIRATORY_TRACT
  Filled 2021-11-30: qty 3

## 2021-11-30 MED ORDER — DM-GUAIFENESIN ER 30-600 MG PO TB12: 1.0000 | ORAL_TABLET | Freq: Two times a day (BID) | ORAL | Status: DC | PRN

## 2021-11-30 NOTE — Assessment & Plan Note (Signed)
See above

## 2021-11-30 NOTE — ED Provider Notes (Signed)
[facility] Provider Note    Event Date/Time   First MD Initiated Contact with Patient 11/30/21 1204     (approximate)   History   Rash   HPI  Kristen Pennington is a 88 y.o. female with a history of GERD, arthritis, chronic low back pain and sciatica, hip osteoarthritis, and generalized anxiety who presents with a generalized rash to her legs, arms, and torso that appeared over the last couple of days as well as some generalized muscle pain.  The patient denies any fever or chills although her ability of history is somewhat limited.  She also reports pain in the right hip and down the right leg but this is chronic and unchanged today.  EMS noted the patient to be hypoxic to the high 80s on room air and put her on nasal cannula.  She is not normally on oxygen.  She reports mild shortness of breath.  Of note, the patient was started on Celebrex, Phenergan, and Lexapro 2 days ago.   Physical Exam   Triage Vital Signs: ED Triage Vitals  Enc Vitals Group     BP --      Pulse Rate 11/30/21 1158 72     Resp 11/30/21 1158 14     Temp 11/30/21 1158 98.3 F (36.8 C)     Temp Source 11/30/21 1158 Oral     SpO2 11/30/21 1158 (!) 88 %     Weight 11/30/21 1201 123 lb 7.3 oz (56 kg)     Height 11/30/21 1201 5\' 5"  (1.651 m)     Head Circumference --      Peak Flow --      Pain Score 11/30/21 1201 0     Pain Loc --      Pain Edu? --      Excl. in GC? --     Most recent vital signs: Vitals:   11/30/21 1230 11/30/21 1337  BP:  (!) 117/48  Pulse: 91 66  Resp: 20 18  Temp:    SpO2: 97% 96%     General: Alert, well appearing for age, no distress CV:  Good peripheral perfusion.  Normal heart sounds. Resp:  Normal effort.  Lungs CTAB. Abd:  No distention.  Other:  Diffuse maculopapular rash over the torso and somewhat on the upper legs and the arms, blanching, nontender, with no excoriation or open ulcers.  Petechial type rash to the lower legs bilaterally.   No palm/sole involvement.  No oropharyngeal or other mucosal involvement.   ED Results / Procedures / Treatments   Labs (all labs ordered are listed, but only abnormal results are displayed) Labs Reviewed  BASIC METABOLIC PANEL - Abnormal; Notable for the following components:      Result Value   Potassium 3.1 (*)    Chloride 97 (*)    BUN 39 (*)    Creatinine, Ser 1.37 (*)    GFR, Estimated 37 (*)    All other components within normal limits  HEPATIC FUNCTION PANEL - Abnormal; Notable for the following components:   AST 156 (*)    ALT 90 (*)    Total Bilirubin 1.3 (*)    Bilirubin, Direct 0.4 (*)    All other components within normal limits  SEDIMENTATION RATE - Abnormal; Notable for the following components:   Sed Rate 44 (*)    All other components within normal limits  RESP PANEL BY RT-PCR (FLU A&B, COVID) ARPGX2  CBC  PROTIME-INR  URINALYSIS, ROUTINE W REFLEX MICROSCOPIC  BRAIN NATRIURETIC PEPTIDE  C-REACTIVE PROTEIN  HEPATITIS PANEL, ACUTE  MAGNESIUM  TROPONIN I (HIGH SENSITIVITY)     EKG  ED ECG REPORT I, Arta Silence, the attending physician, personally viewed and interpreted this ECG.  Date: 11/30/2021 EKG Time: 1201 Rate: 69 Rhythm: normal sinus rhythm QRS Axis: Borderline right axis Intervals: normal ST/T Wave abnormalities: normal Narrative Interpretation: no evidence of acute ischemia    RADIOLOGY  Chest x-ray: I independently viewed and interpreted the images; there is possibly a right middle lobe opacity with no other focal consolidation or edema  PROCEDURES:  Critical Care performed: No  Procedures   MEDICATIONS ORDERED IN ED: Medications  azithromycin (ZITHROMAX) 500 mg in sodium chloride 0.9 % 250 mL IVPB (500 mg Intravenous New Bag/Given 11/30/21 1401)  albuterol (PROVENTIL) (2.5 MG/3ML) 0.083% nebulizer solution 3 mL (has no administration in time range)  dextromethorphan-guaiFENesin (MUCINEX DM) 30-600 MG per 12 hr tablet 1  tablet (has no administration in time range)  ondansetron (ZOFRAN) injection 4 mg (has no administration in time range)  hydrALAZINE (APRESOLINE) injection 5 mg (has no administration in time range)  potassium chloride SA (KLOR-CON M) CR tablet 40 mEq (has no administration in time range)  predniSONE (DELTASONE) tablet 20 mg (has no administration in time range)  famotidine (PEPCID) tablet 20 mg (has no administration in time range)  diphenhydrAMINE (BENADRYL) capsule 25 mg (has no administration in time range)  albuterol (PROVENTIL) (2.5 MG/3ML) 0.083% nebulizer solution 2.5 mg (2.5 mg Nebulization Given 11/30/21 1400)     IMPRESSION / MDM / ASSESSMENT AND PLAN / ED COURSE  I reviewed the triage vital signs and the nursing notes.  88 year old female with PMH as noted above presents with acute onset of a generalized rash over the last 1 to 2 days and was also noted to be hypoxic by EMS although denies significant shortness of breath.  I reviewed the past medical records.  The patient was most recently seen by her primary care NP Abernathy on 5/18 and at that time was started on Celebrex, Phenergan, and Lexapro, as well as getting a vitamin B12 injection.  On exam currently the patient is overall well-appearing for her age and her vital signs are normal except for the O2 saturation.  She does not demonstrate any increased work of breathing and her lungs are clear.  Neurologic exam is nonfocal.  There is a diffuse rash as above, more petechial appearing on the lower legs and maculopapular on the torso, proximal legs, and arms.  Differential diagnosis includes, but is not limited to, drug eruption (which could have been caused by any of the 3 medications he was started on the other day), allergic reaction, viral syndrome, nonspecific dermatitis.  Differential for the shortness of breath includes bronchitis, atelectasis, pneumonia, or less likely new onset cardiac etiology.  We will obtain a chest  x-ray, lab work-up, and reassess.  The patient is on the cardiac monitor to evaluate for evidence of arrhythmia and/or significant heart rate changes.  ----------------------------------------- 2:13 PM on 11/30/2021 -----------------------------------------  Lab work-up is overall reassuring.  The patient has elevated LFTs of unclear significance but a normal bilirubin.  Electrolytes are normal except for borderline low potassium.  ESR is elevated.  Chest x-ray shows possible right middle lobe opacity.  The patient does have a cough although no elevated WBC count or fever.  I will treat for possible pneumonia with azithromycin.  Given the new oxygen requirement she will  need admission.  I consulted Dr. Blaine Hamper from the hospitalist service; based on our discussion he agrees to admit the patient.   FINAL CLINICAL IMPRESSION(S) / ED DIAGNOSES   Final diagnoses:  Drug eruption  Acute respiratory failure with hypoxia ([place])  Community acquired pneumonia of right middle lobe of lung     Rx / DC Orders   ED Discharge Orders     None        Note:  This document was prepared using Dragon voice recognition software and may include unintentional dictation errors.    Arta Silence, MD 11/30/21 1414

## 2021-11-30 NOTE — ED Notes (Signed)
ED Provider at bedside. 

## 2021-11-30 NOTE — Assessment & Plan Note (Addendum)
Possible CAP: Patient has cough and mild shortness of breath, with 3 L of new oxygen requirement.  Etiology is not clear.  Chest x-ray showed possible right middle lobe and lingula infiltration, indicating possible pneumonia, but the patient does not have fever or leukocytosis.  Mildly elevated BNP at 176 and AKI.  Procalcitonin elevated at 12.37. She was started on ceftriaxone and Zithromax for concern of pneumonia. Strep pneumo negative and Legionella pending.  Blood and sputum cultures pending. -Continue IV Rocephin and azithromycin - Mucinex for cough  - Bronchodilators -Continue with supportive care. -Continue supplemental oxygen-wean as tolerated

## 2021-11-30 NOTE — Assessment & Plan Note (Signed)
-   Hold Lexapro -Continue home trazodone at bedtime

## 2021-11-30 NOTE — Assessment & Plan Note (Signed)
Chronic pain syndrome mainly due to chronic back pain and bilateral hip pain: -Hold newly started Celebrex -Continue as needed tramadol, as needed Flexeril

## 2021-11-30 NOTE — ED Triage Notes (Signed)
Pt presents via EMS c/o generalized rash all over body for the last few days and generalized intermittent muscle pain per pt report. Reports recent abx for UTI. EMS report IV Zofran given for nausea. EMS reports 02 saturation 88% on RA, placed on 2L via [place] by EMS.

## 2021-11-30 NOTE — H&P (Signed)
History and Physical    Kristen Pennington ACZ:[id number] DOB: 1928/08/10 DOA: 11/30/2021  Referring MD/NP/PA:   PCP: Jonetta Osgood, NP   Patient coming from:  The patient is coming from home.  At baseline, pt is independent for most of ADL.        Chief Complaint: Rash, cough  HPI: Kristen Pennington is a 88 y.o. female with medical history significant of chronic pain syndrome mainly due to chronic low back pain and bilateral hip pain, hypertension, hyperlipidemia, OSA not on CPAP, depression, anxiety, CKD-3A, who presents with rash and a cough.  Pt reports that she developed diffused erythematous rash in her legs, arms and torso.  She could not tell exactly when the rash started, possibly 1 or 2 days.  She does not feel itchy or pain.  No oral mucosal involvement.  Of note, patient was newly started on Celebrex for chronic pain, Phenergan for nausea and Lexapro for depression on 5/18.  Patient has no fever or chills.  She reports cough, chest tightness, mild shortness of breath today.  Patient was found to have oxygen desaturating to 88% on room air, which improved to 96% on 3 L oxygen.  Patient is not using oxygen normally.  She was recently treated with antibiotics for UTI.  She states that her UTI symptoms has resolved.  She does not remember what antibiotics she took for UTI.  Patient has nausea for more than a week, no vomiting, diarrhea or abdominal pain. She complains of chronic lower back pain and bilateral hip pain. No injury.  Data Reviewed and ED Course: pt was found to have WBC 9.7, BNP 179, troponin level 6, INR 1.1, potassium 3.1, abnormal liver function (ALP 67, AST 156, ALT 98, total bilirubin 1.3), negative urinalysis, negative COVID PCR, worsening renal function, temperature normal, blood pressure 117/48, heart rate 91, RR 20, chest x-ray showed possible right middle lobe and lingula opacity.  Patient placed on telemetry bed for observation.  EKG: I have personally reviewed.   Sinus rhythm, QTc 435, low voltage, nonspecific T wave change  Review of Systems:   General: no fevers, chills, no body weight gain, has poor appetite, has fatigue HEENT: no blurry vision, hearing changes or sore throat Respiratory: has dyspnea, coughing, no wheezing CV: no chest pain, no palpitations GI: has nausea, no vomiting, abdominal pain, diarrhea, constipation GU: no dysuria, burning on urination, increased urinary frequency, hematuria  Ext: has leg edema Neuro: no unilateral weakness, numbness, or tingling, no vision change or hearing loss Skin: has rash MSK: has back pain and hip pain Heme: No easy bruising.  Travel history: No recent long distant travel.   Allergy: No Known Allergies  Past Medical History:  Diagnosis Date   Arthritis    Atrophic vaginitis 03/06/2013   Last Assessment & Plan:  She will plan to continue estradiol vaginal cream.  Prescription was rewritten stipulating use of the generic product.    Atypical migraine 03/06/2013   Last Assessment & Plan:  Since she rarely takes sumatriptan, we will discontinue it. Continue sparing use of OTC migraine products.    Avitaminosis D 03/06/2013   Last Assessment & Plan:  Recheck vitamin D level. Plan to continue vitamin D supplementation.    Ceratitis 08/01/2013   Last Assessment & Plan:  Because of the expense, she will discontinue Restasis. She will use over-the-counter moisturizing eyedrops and liquid gel.    Colon polyp    Combined fat and carbohydrate induced hyperlipemia 03/06/2013  Last Assessment & Plan:  Recheck fasting lipids.    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    Cystocele, midline 03/06/2013   Demoralization and apathy 03/06/2013   Last Assessment & Plan:  Since she has been taking bupropion only once a day, I will write the prescription with the correct instructions and correct  quantity. She has done well on this for years and she is encouraged to take it regularly    Depression    Environmental allergies    Epistaxis    Essential (primary) hypertension 05/17/2013   Last Assessment & Plan:  Her blood pressure is well-controlled. We will plan to continue hydrochlorothiazide and benazepril.  Both are generic and should be affordable on her health plan.    GERD (gastroesophageal reflux disease)    Hemorrhoid    Inflammation of sacroiliac joint ([place]) 03/06/2013   Last Assessment & Plan:  She is doing well on her present regimen of fentanyl patch supplemented with sparing use of tramadol/acetaminophen. We will continue that regimen.    Sinusitis    Sleep apnea    CPAP    Past Surgical History:  Procedure Laterality Date   COLONOSCOPY  05-03-15   COLONOSCOPY WITH PROPOFOL N/A 05/03/2015   Procedure: COLONOSCOPY WITH PROPOFOL;  Surgeon: Lucilla Lame, MD;  Location: [facility];  Service: Endoscopy;  Laterality: N/A;  CPAP   DILATION AND CURETTAGE OF UTERUS  1970   ENDOMETRIAL BIOPSY     EYE SURGERY Right 2017   POLYPECTOMY  05/03/2015   Procedure: POLYPECTOMY;  Surgeon: Lucilla Lame, MD;  Location: [facility];  Service: Endoscopy;;   ROTATOR CUFF REPAIR  2007   SPINE SURGERY      Social History:  reports that she has never smoked. She has never used smokeless tobacco. She reports that she does not drink alcohol and does not use drugs.  Family History:  Family History  Problem Relation Age of Onset   Arthritis Mother    Alzheimer's disease Father    Heart disease Sister    Cancer Brother    Ovarian cancer Neg Hx    Breast cancer Neg Hx    Colon cancer Neg Hx    Diabetes Neg Hx      Prior to Admission medications   Medication Sig Start Date End Date Taking? Authorizing Provider  acetaminophen (TYLENOL) 500 MG tablet Take 500 mg by mouth every 6 (six) hours as needed.    [provider]  bisoprolol-hydrochlorothiazide (ZIAC) 5-6.25  MG tablet Take 1 tablet by mouth daily. 10/29/21   Jonetta Osgood, NP  celecoxib (CELEBREX) 50 MG capsule Take 1 capsule (50 mg total) by mouth 2 (two) times daily. Take with food. 11/27/21   Jonetta Osgood, NP  Cholecalciferol (VITAMIN D3) 5000 units TABS Take 5,000 Units by mouth daily.    [provider]  cyclobenzaprine (FLEXERIL) 10 MG tablet TAKE 1 TABLET BY MOUTH AT BEDTIME FOR BACK SPASMS AS NEEDED ONLY. 02/05/21   Lavera Guise, MD  escitalopram (LEXAPRO) 20 MG tablet Take 1 tablet (20 mg total) by mouth daily. 11/27/21   Jonetta Osgood, NP  estradiol (ESTRACE) 0.1 MG/GM vaginal cream Use once a week as needed 10/29/21   Jonetta Osgood, NP  fluticasone (FLONASE) 50 MCG/ACT nasal spray 1 SPRAY IN EACH NOSTRIL ONCE DAILY AS NEEDED 10/29/21   Jonetta Osgood, NP  furosemide (LASIX)  20 MG tablet Take 1 tablet (20 mg total) by mouth daily. 10/29/21   Jonetta Osgood, NP  Liniments (SALONPAS PAIN RELIEF PATCH EX) Apply 1 patch topically 2 (two) times daily.    [provider]  lubiprostone (AMITIZA) 8 MCG capsule TAKE 1 CAPSULE BY MOUTH ONCE DAILY FOR CONSTIPATION 10/29/21   Abernathy, Yetta Flock, NP  montelukast (SINGULAIR) 10 MG tablet Take 1 tablet (10 mg total) by mouth at bedtime. 10/29/21   Jonetta Osgood, NP  Multiple Vitamins-Minerals (CENTRUM SILVER 50+WOMEN) TABS Take by mouth.    [provider]  ondansetron (ZOFRAN) 4 MG tablet Take 1 tablet (4 mg total) by mouth every 8 (eight) hours as needed for nausea or vomiting. 10/29/21   Jonetta Osgood, NP  Polyethyl Glycol-Propyl Glycol (SYSTANE) 0.4-0.3 % SOLN Apply to eye.    [provider]  polyethylene glycol powder (GLYCOLAX/MIRALAX) 17 GM/SCOOP powder Take 17 g by mouth daily as needed for mild constipation or moderate constipation. 10/29/21   Jonetta Osgood, NP  promethazine (PHENERGAN) 12.5 MG tablet Take 1 tablet (12.5 mg total) by mouth every 6 (six) hours as needed for nausea or  vomiting. 11/27/21   Jonetta Osgood, NP  rOPINIRole (REQUIP) 1 MG tablet Take 1 tablet (1 mg total) by mouth 3 (three) times daily as needed (restless legs). 10/29/21   Jonetta Osgood, NP  traMADol (ULTRAM) 50 MG tablet TAKE ONE TABLET TWICE A DAY IF NEEDED FOR SEVERE PAIN 10/29/21   Jonetta Osgood, NP  traZODone (DESYREL) 50 MG tablet Take 0.5-1 tablets (25-50 mg total) by mouth at bedtime. 10/29/21   Jonetta Osgood, NP    Physical Exam: Vitals:   11/30/21 1201 11/30/21 1230 11/30/21 1337 11/30/21 1511  BP:   (!) 117/48 123/72  Pulse:  91 66 78  Resp:  '20 18 18  '$ Temp:    98.3 F (36.8 C)  TempSrc:      SpO2:  97% 96% 96%  Weight: 56 kg     Height: '5\' 5"'$  (1.651 m)      General: Not in acute distress HEENT:       Eyes: PERRL, EOMI, no scleral icterus.       ENT: No discharge from the ears and nose, no pharynx injection, no tonsillar enlargement.        Neck: No JVD, no bruit, no mass felt. Heme: No neck lymph node enlargement. Cardiac: S1/S2, RRR, No murmurs, No gallops or rubs. Respiratory: No rales, wheezing, rhonchi or rubs. GI: Soft, nondistended, nontender, no rebound pain, no organomegaly, BS present. GU: No hematuria Ext: has trace leg edema bilaterally. 1+DP/PT pulse bilaterally. Musculoskeletal: No joint deformities, No joint redness or warmth, no limitation of ROM in spin. Skin: has diffuse erythematous petechial rash in torso, all extremities, nontender, not itchy. No excoriation or open ulcers. No oropharyngeal or other mucosal involvement.  Petechial type rash to the lower legs bilaterally.      Neuro: Alert, oriented X3, cranial nerves II-XII grossly intact, moves all extremities normally.  Psych: Patient is not psychotic, no suicidal or hemocidal ideation.  Labs on Admission: I have personally reviewed following labs and imaging studies  CBC: Recent Labs  Lab 11/30/21 1205  WBC 9.7  HGB 12.8  HCT 40.2  MCV 95.5  PLT 030   Basic Metabolic  Panel: Recent Labs  Lab 11/30/21 1205  NA 139  K 3.1*  CL 97*  CO2 32  GLUCOSE 95  BUN 39*  CREATININE 1.37*  CALCIUM 9.6  GFR: Estimated Creatinine Clearance: 24.1 mL/min (A) (by C-G formula based on SCr of 1.37 mg/dL (H)). Liver Function Tests: Recent Labs  Lab 11/30/21 1158  AST 156*  ALT 90*  ALKPHOS 67  BILITOT 1.3*  PROT 6.7  ALBUMIN 3.5   No results for input(s): LIPASE, AMYLASE in the last 168 hours. No results for input(s): AMMONIA in the last 168 hours. Coagulation Profile: Recent Labs  Lab 11/30/21 1158  INR 1.1   Cardiac Enzymes: No results for input(s): CKTOTAL, CKMB, CKMBINDEX, TROPONINI in the last 168 hours. BNP (last 3 results) No results for input(s): PROBNP in the last 8760 hours. HbA1C: No results for input(s): HGBA1C in the last 72 hours. CBG: No results for input(s): GLUCAP in the last 168 hours. Lipid Profile: No results for input(s): CHOL, HDL, LDLCALC, TRIG, CHOLHDL, LDLDIRECT in the last 72 hours. Thyroid Function Tests: No results for input(s): TSH, T4TOTAL, FREET4, T3FREE, THYROIDAB in the last 72 hours. Anemia Panel: No results for input(s): VITAMINB12, FOLATE, FERRITIN, TIBC, IRON, RETICCTPCT in the last 72 hours. Urine analysis:    Component Value Date/Time   COLORURINE YELLOW (A) 11/30/2021 1422   APPEARANCEUR CLEAR (A) 11/30/2021 1422   APPEARANCEUR Clear 03/02/2018 1048   LABSPEC 1.009 11/30/2021 1422   PHURINE 5.0 11/30/2021 1422   GLUCOSEU NEGATIVE 11/30/2021 1422   HGBUR NEGATIVE 11/30/2021 1422   BILIRUBINUR NEGATIVE 11/30/2021 1422   BILIRUBINUR Negative 04/07/2021 1221   BILIRUBINUR Negative 03/02/2018 1048   KETONESUR NEGATIVE 11/30/2021 1422   PROTEINUR NEGATIVE 11/30/2021 1422   UROBILINOGEN 0.2 04/07/2021 1221   NITRITE NEGATIVE 11/30/2021 1422   LEUKOCYTESUR NEGATIVE 11/30/2021 1422   Sepsis Labs: '@LABRCNTIP'$ (procalcitonin:4,lacticidven:4) ) Recent Results (from the past 240 hour(s))  Resp Panel by  RT-PCR (Flu A&B, Covid) Nasopharyngeal Swab     Status: None   Collection Time: 11/30/21  2:22 PM   Specimen: Nasopharyngeal Swab; Nasopharyngeal(NP) swabs in vial transport medium  Result Value Ref Range Status   SARS Coronavirus 2 by RT PCR NEGATIVE NEGATIVE Final    Comment: (NOTE) SARS-CoV-2 target nucleic acids are NOT DETECTED.  The SARS-CoV-2 RNA is generally detectable in upper respiratory specimens during the acute phase of infection. The lowest concentration of SARS-CoV-2 viral copies this assay can detect is 138 copies/mL. A negative result does not preclude SARS-Cov-2 infection and should not be used as the sole basis for treatment or other patient management decisions. A negative result may occur with  improper specimen collection/handling, submission of specimen other than nasopharyngeal swab, presence of viral mutation(s) within the areas targeted by this assay, and inadequate number of viral copies(<138 copies/mL). A negative result must be combined with clinical observations, patient history, and epidemiological information. The expected result is Negative.  Fact Sheet for Patients:  EntrepreneurPulse.com.au  Fact Sheet for Healthcare Providers:  IncredibleEmployment.be  This test is no t yet approved or cleared by the [place] FDA and  has been authorized for detection and/or diagnosis of SARS-CoV-2 by FDA under an Emergency Use Authorization (EUA). This EUA will remain  in effect (meaning this test can be used) for the duration of the COVID-19 declaration under Section 564(b)(1) of the Act, 21 U.S.C.section 360bbb-3(b)(1), unless the authorization is terminated  or revoked sooner.       Influenza A by PCR NEGATIVE NEGATIVE Final   Influenza B by PCR NEGATIVE NEGATIVE Final    Comment: (NOTE) The Xpert Xpress SARS-CoV-2/FLU/RSV plus assay is intended as an aid in the diagnosis of influenza from  Nasopharyngeal swab  specimens and should not be used as a sole basis for treatment. Nasal washings and aspirates are unacceptable for Xpert Xpress SARS-CoV-2/FLU/RSV testing.  Fact Sheet for Patients: EntrepreneurPulse.com.au  Fact Sheet for Healthcare Providers: IncredibleEmployment.be  This test is not yet approved or cleared by the [place] FDA and has been authorized for detection and/or diagnosis of SARS-CoV-2 by FDA under an Emergency Use Authorization (EUA). This EUA will remain in effect (meaning this test can be used) for the duration of the COVID-19 declaration under Section 564(b)(1) of the Act, 21 U.S.C. section 360bbb-3(b)(1), unless the authorization is terminated or revoked.  Performed at [facility], [address], [place], [place] [address]      Radiological Exams on Admission: DG Chest 2 View  Result Date: 11/30/2021 CLINICAL DATA:  Shortness of breath and hypoxia. EXAM: CHEST - 2 VIEW COMPARISON:  None Available. FINDINGS: The cardiomediastinal silhouette is unremarkable. Equivocal RIGHT middle lobe versus lingular opacity, best identified on the LATERAL view may represent atelectasis or airspace disease/pneumonia. There is no evidence of pulmonary edema, suspicious pulmonary nodule/mass, pleural effusion, or pneumothorax. No acute bony abnormalities are identified. LEFT shoulder surgical changes noted. IMPRESSION: Equivocal RIGHT middle lobe versus lingular opacity which may represent atelectasis or airspace disease/pneumonia. Electronically Signed   By: Margarette Canada M.D.   On: 11/30/2021 12:36      Assessment/Plan Principal Problem:   CAP (community acquired pneumonia) Active Problems:   Rash   Hypokalemia   Abnormal LFTs   Acute renal failure superimposed on stage 3a chronic kidney disease (HCC)   Chronic pain syndrome   Chronic low back pain (Midline) w/o sciatica   Osteoarthritis of hips (Bilateral)   Depression with  anxiety   Essential hypertension   Principal Problem:   CAP (community acquired pneumonia) Active Problems:   Rash   Hypokalemia   Abnormal LFTs   Acute renal failure superimposed on stage 3a chronic kidney disease (HCC)   Chronic pain syndrome   Chronic low back pain (Midline) w/o sciatica   Osteoarthritis of hips (Bilateral)   Depression with anxiety   Essential hypertension   Assessment and Plan: * CAP (community acquired pneumonia) Possible CAP: Patient has cough and mild shortness of breath, with 3 L of new oxygen requirement.  Etiology is not clear.  Chest x-ray showed possible right middle lobe and lingula infiltration, indicating possible pneumonia, but the patient does not have fever or leukocytosis.  Patient has mild leg edema, and mildly elevated BNP 179, will need to rule out acute CHF.  Since patient has worsening renal function, will not start IV diuretics today.  Will start empiric antibiotics for possible CAP.  - Admit to tele bed as inpt - f/u 2d echo - IV Rocephin and azithromycin - Mucinex for cough  - Bronchodilators - Urine legionella and S. pneumococcal antigen - Follow up blood culture x2, sputum culture  - will check procalcitonin level   Rash Etiology is not clear.  Possibly due to drug reaction -Hold newly started medications including Celebrex, Phenergan, Lexapro -Prednisone 20 mg daily -As needed Benadryl  Hypokalemia Potassium 3.1 -Repleted potassium -Check magnesium level  Abnormal LFTs Etiology is not acute.  Patient has nausea, but no abdominal pain, fever or chills. -Hepatitis panel -Follow-up right upper quadrant ultrasound -Avoid using Tylenol  Acute renal failure superimposed on stage 3a chronic kidney disease ([place]) Baseline creatinine 1.04 on 09/22/2021.  Her creatinine is 1.37, BUN 39 -Hold HCTZ. Change ZIAC to bisoprolol  5 mg daily -hold lasix  Chronic pain syndrome Chronic pain syndrome mainly due to chronic back pain and  bilateral hip pain: -Hold newly started Celebrex -Continue as needed tramadol, as needed Flexeril  Chronic low back pain (Midline) w/o sciatica See above  Osteoarthritis of hips (Bilateral) See above  Depression with anxiety - Hold Lexapro -Continue home trazodone at bedtime  Essential hypertension - IV hydralazine as needed -Bisoprolol -Hold Lasix and HCTZ as above              DVT ppx: SQ Lovenox  Code Status:  DNR per pt and her daughter  Family Communication:  Yes, patient's  daughter   by phone  Disposition Plan:  Anticipate discharge back to previous environment  Consults called:  none  Admission status and Level of care: Telemetry Medical:    for obs     Severity of Illness:  The appropriate patient status for this patient is INPATIENT. Inpatient status is judged to be reasonable and necessary in order to provide the required intensity of service to ensure the patient's safety. The patient's presenting symptoms, physical exam findings, and initial radiographic and laboratory data in the context of their chronic comorbidities is felt to place them at high risk for further clinical deterioration. Furthermore, it is not anticipated that the patient will be medically stable for discharge from the hospital within 2 midnights of admission.   * I certify that at the point of admission it is my clinical judgment that the patient will require inpatient hospital care spanning beyond 2 midnights from the point of admission due to high intensity of service, high risk for further deterioration and high frequency of surveillance required.*       Date of Service 11/30/2021    Ivor Costa [facility]   If 7PM-7AM, please contact night-coverage [email or web address] 11/30/2021, 4:59 PM

## 2021-11-30 NOTE — Assessment & Plan Note (Signed)
Etiology is not clear.  Possibly due to drug reaction -Hold newly started medications including Celebrex, Phenergan, Lexapro -Prednisone 20 mg daily -As needed Benadryl

## 2021-11-30 NOTE — Assessment & Plan Note (Addendum)
Baseline creatinine 1.04 on 09/22/2021.  Her creatinine is 1.37>>1.48, BUN 48. -Give her some gentle IV fluid -Hold HCTZ. Change ZIAC to bisoprolol 5 mg daily -hold lasix -Monitor renal function -Avoid nephrotoxins

## 2021-11-30 NOTE — Assessment & Plan Note (Signed)
-   IV hydralazine as needed -Bisoprolol -Hold Lasix and HCTZ as above

## 2021-11-30 NOTE — Assessment & Plan Note (Addendum)
Etiology is not acute.  Patient has nausea, but no abdominal pain, fever or chills. Right upper quadrant ultrasound was without any significant abnormality.  Hepatitis panel negative. -Continue to monitor -Avoid using Tylenol

## 2021-11-30 NOTE — ED Notes (Signed)
Admitting Provider at bedside. 

## 2021-11-30 NOTE — Assessment & Plan Note (Addendum)
Resolved with repletion.  Magnesium within normal limit. -Continue to monitor and replete as needed

## 2021-11-30 NOTE — Telephone Encounter (Signed)
Pt called at 10:40 am this morning and LMOM that she had called 911 and she had an allergic reaction to something and had hives or a rash and they gave her benadryl and told her to call her PCP to let us know.  I tried to call pt back but no answer so I LMOM and advised pt that if she was feeling worse that she should go to the ER.  I looked on chart and it showed that pt had went to the ER and was being seen.

## 2021-11-30 NOTE — Progress Notes (Signed)
PHARMACIST - PHYSICIAN COMMUNICATION  CONCERNING:  Enoxaparin (Lovenox) for DVT Prophylaxis    RECOMMENDATION: Patient was prescribed enoxaprin '40mg'$  q24 hours for VTE prophylaxis.   Filed Weights   11/30/21 1201  Weight: 56 kg (123 lb 7.3 oz)    Body mass index is 20.54 kg/m.  Estimated Creatinine Clearance: 24.1 mL/min (A) (by C-G formula based on SCr of 1.37 mg/dL (H)).   Based on [place] patient is candidate for enoxaparin '30mg'$  every 24 hours based on CrCl <12m/min or Weight <45kg  DESCRIPTION: Pharm[facility]  Patient is now receiving enoxaparin 30 mg every 24 hours    Lelend Heinecke Rodriguez-Guzman PharmD, BCPS 11/30/2021 2:46 PM

## 2021-12-01 ENCOUNTER — Inpatient Hospital Stay (HOSPITAL_COMMUNITY)
Admit: 2021-12-01 | Discharge: 2021-12-01 | Disposition: A | Discharge status: Home / Self Care | Payer: Medicare HMO | Attending: Internal Medicine | Admitting: Internal Medicine

## 2021-12-01 DIAGNOSIS — N1831 Chronic kidney disease, stage 3a: Secondary | ICD-10-CM | POA: Diagnosis not present

## 2021-12-01 DIAGNOSIS — I5031 Acute diastolic (congestive) heart failure: Secondary | ICD-10-CM | POA: Diagnosis not present

## 2021-12-01 DIAGNOSIS — R7989 Other specified abnormal findings of blood chemistry: Secondary | ICD-10-CM | POA: Diagnosis not present

## 2021-12-01 DIAGNOSIS — J189 Pneumonia, unspecified organism: Secondary | ICD-10-CM | POA: Diagnosis not present

## 2021-12-01 DIAGNOSIS — N179 Acute kidney failure, unspecified: Secondary | ICD-10-CM | POA: Diagnosis not present

## 2021-12-01 LAB — CBC
HCT: 34.5 % — ABNORMAL LOW (ref 36.0–46.0)
Hemoglobin: 11.1 g/dL — ABNORMAL LOW (ref 12.0–15.0)
MCH: 31 pg (ref 26.0–34.0)
MCHC: 32.2 g/dL (ref 30.0–36.0)
MCV: 96.4 fL (ref 80.0–100.0)
Platelets: 230 10*3/uL (ref 150–400)
RBC: 3.58 MIL/uL — ABNORMAL LOW (ref 3.87–5.11)
RDW: 12.6 % (ref 11.5–15.5)
WBC: 11.6 10*3/uL — ABNORMAL HIGH (ref 4.0–10.5)
nRBC: 0 % (ref 0.0–0.2)

## 2021-12-01 LAB — BASIC METABOLIC PANEL
Anion gap: 8 (ref 5–15)
BUN: 48 mg/dL — ABNORMAL HIGH (ref 8–23)
CO2: 33 mmol/L — ABNORMAL HIGH (ref 22–32)
Calcium: 9.1 mg/dL (ref 8.9–10.3)
Chloride: 97 mmol/L — ABNORMAL LOW (ref 98–111)
Creatinine, Ser: 1.48 mg/dL — ABNORMAL HIGH (ref 0.44–1.00)
GFR, Estimated: 33 mL/min — ABNORMAL LOW (ref >60)
Glucose, Bld: 113 mg/dL — ABNORMAL HIGH (ref 70–99)
Potassium: 4.2 mmol/L (ref 3.5–5.1)
Sodium: 138 mmol/L (ref 135–145)

## 2021-12-01 LAB — COMPREHENSIVE METABOLIC PANEL
ALT: 169 U/L — ABNORMAL HIGH (ref 0–44)
AST: 174 U/L — ABNORMAL HIGH (ref 15–41)
Albumin: 3.7 g/dL (ref 3.5–5.0)
Alkaline Phosphatase: 91 U/L (ref 38–126)
Anion gap: 10 (ref 5–15)
BUN: 43 mg/dL — ABNORMAL HIGH (ref 8–23)
CO2: 33 mmol/L — ABNORMAL HIGH (ref 22–32)
Calcium: 10.3 mg/dL (ref 8.9–10.3)
Chloride: 93 mmol/L — ABNORMAL LOW (ref 98–111)
Creatinine, Ser: 1.35 mg/dL — ABNORMAL HIGH (ref 0.44–1.00)
GFR, Estimated: 37 mL/min — ABNORMAL LOW (ref >60)
Glucose, Bld: 142 mg/dL — ABNORMAL HIGH (ref 70–99)
Potassium: 5 mmol/L (ref 3.5–5.1)
Sodium: 136 mmol/L (ref 135–145)
Total Bilirubin: 0.6 mg/dL (ref 0.3–1.2)
Total Protein: 7.5 g/dL (ref 6.5–8.1)

## 2021-12-01 LAB — ECHOCARDIOGRAM COMPLETE
AR max vel: 2.62 cm2
AV Area VTI: 2.83 cm2
AV Area mean vel: 2.63 cm2
AV Mean grad: 4.5 mmHg
AV Peak grad: 7.7 mmHg
Ao pk vel: 1.39 m/s
Area-P 1/2: 2.67 cm2
Height: 65 in
MV VTI: 2.45 cm2
S' Lateral: 2.1 cm
Weight: 2031.76 oz

## 2021-12-01 LAB — HEPATITIS PANEL, ACUTE
HCV Ab: NONREACTIVE
Hep A IgM: NONREACTIVE
Hep B C IgM: NONREACTIVE
Hepatitis B Surface Ag: NONREACTIVE

## 2021-12-01 LAB — STREP PNEUMONIAE URINARY ANTIGEN: Strep Pneumo Urinary Antigen: NEGATIVE

## 2021-12-01 MED ORDER — ROPINIROLE HCL 1 MG PO TABS
2.0000 mg | ORAL_TABLET | Freq: Once | ORAL | Status: AC
  Administered 2021-12-01: 2 mg via ORAL
  Filled 2021-12-01: qty 2

## 2021-12-01 MED ORDER — LACTATED RINGERS IV SOLN: INTRAVENOUS | Status: AC

## 2021-12-01 NOTE — Progress Notes (Signed)
Mobility Specialist - Progress Note   12/01/21 1552  Mobility  Activity Ambulated independently in hallway  Level of Assistance Independent  Assistive Device None  Distance Ambulated (ft) 360 ft  Activity Response Tolerated well  $Mobility charge 1 Mobility     Pre-mobility: 75 HR, 97% SpO2 During mobility: 84 HR, 93% SpO2   Pt standing at sink upon arrival, utilizing RA. Pt able to don socks independently in figure 4 position. Ambulated in hallway independently. Mild SOB, but O2 maintained mid-high 90s throughout ambulation. Pt reports being a little upset about "outside things" but no complaints otherwise. Pt left in bed with needs in reach. RN notified.   Kathee Delton Mobility Specialist 12/01/21, 3:55 PM

## 2021-12-01 NOTE — TOC Initial Note (Signed)
Transition of Care [facility]) - Initial/Assessment Note    Patient Details  Name: Kristen Pennington MRN: [id number] Date of Birth: [birth year]  Transition of Care [facility]) CM/SW Contact:    Beverly Sessions, RN Phone Number: 12/01/2021, 3:36 PM  Clinical Narrative:                   Transition of Care [facility]) Screening Note   Patient Details  Name: Kristen Pennington Date of Birth: December 23, 1928   Transition of Care [facility]) CM/SW Contact:    Beverly Sessions, RN Phone Number: 12/01/2021, 3:36 PM    Transition of Care Department [facility]) has reviewed patient and no TOC needs have been identified at this time. We will continue to monitor patient advancement through interdisciplinary progression rounds. If new patient transition needs arise, please place a TOC consult.  Per rounds patient completely independent and should not have needs at discharge She was weaned to RA       Patient Goals and CMS Choice        Expected Discharge Plan and Services                                                Prior Living Arrangements/Services                       Activities of Daily Living Home Assistive Devices/Equipment: None ADL Screening (condition at time of admission) Patient's cognitive ability adequate to safely complete daily activities?: Yes Is the patient deaf or have difficulty hearing?: No Does the patient have difficulty seeing, even when wearing glasses/contacts?: No Does the patient have difficulty concentrating, remembering, or making decisions?: No Patient able to express need for assistance with ADLs?: Yes Does the patient have difficulty dressing or bathing?: No Independently performs ADLs?: Yes (appropriate for developmental age) Does the patient have difficulty walking or climbing stairs?: No Weakness of Legs: None Weakness of Arms/Hands: None  Permission Sought/Granted                  Emotional Assessment              Admission  diagnosis:  Drug eruption [L27.0] CAP (community acquired pneumonia) [J18.9] Acute respiratory failure with hypoxia ([place]) [J96.01] Community acquired pneumonia of right middle lobe of lung [J18.9] Patient Active Problem List   Diagnosis Date Noted   CAP (community acquired pneumonia) 11/30/2021   Rash 11/30/2021   Hypokalemia 11/30/2021   Abnormal LFTs 11/30/2021   Acute renal failure superimposed on stage 3a chronic kidney disease ([place]) 11/30/2021   Depression with anxiety 11/30/2021   Abnormal involuntary movements 06/11/2021   Elevated blood pressure reading 03/12/2021   Insomnia due to medical condition 03/11/2021   Muscle strain of left upper back 12/22/2020   Memory problem 08/29/2020   Chronic shoulder pain (Left) 06/03/2020   Arthralgia of shoulder region (Left) 06/03/2020   Osteoarthritis of shoulder (Left) 06/03/2020   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   Acute shoulder pain (Left) 05/30/2020   Chronic low back pain (Midline) w/o sciatica 05/15/2020   Spinal enthesopathy of lumbar region [facility]) 05/15/2020   Kissing spine syndrome 05/06/2020   Long term prescription opiate use 04/29/2020   Encounter for general adult medical examination with abnormal findings 03/20/2020   Hyperkalemia 03/20/2020   Hypercalcemia 03/20/2020   Abnormal kidney function  03/20/2020   Abnormal MRI, lumbar spine (01/08/2020) 02/14/2020   Lumbar facet hypertrophy 02/14/2020   MDD (major depressive disorder), recurrent, in full remission ([place]) 12/18/2019   Osteoarthritis of hips (Bilateral) 11/20/2019   Lumbar facet arthropathy 11/20/2019   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/2019   Primary osteoarthritis involving multiple joints 11/20/2019   OSA on CPAP 10/26/2019   MDD (major depressive disorder), recurrent, in partial remission ([place]) 09/19/2019   Bereavement 09/19/2019   Chronic pain syndrome 08/21/2019   Pharmacologic therapy 08/21/2019   Disorder of skeletal system 08/21/2019    Problems influencing health status 08/21/2019   History of lumbar spinal fusion 08/21/2019   Chronic hip pain (2ry area of Pain) (Bilateral) (R>L) 08/21/2019   Lumbar facet syndrome (Bilateral) 08/21/2019   Dysuria 07/02/2019   Delusional disorder ([place]) 03/08/2019   MDD (major depressive disorder), recurrent episode, mild ([place]) 01/18/2019   Insomnia due to mental condition 01/18/2019   History of delusional disorder 01/18/2019   Gastroesophageal reflux disease without esophagitis 11/27/2018   Irritable bowel syndrome with constipation 11/27/2018   Chronic low back pain (1ry area of Pain) (Bilateral) (R>L) w/o sciatica 08/17/2018   Vitamin B12 deficiency 08/17/2018   Elevated TSH 08/17/2018   Urinary tract infection without hematuria 08/16/2017   Generalized anxiety disorder 08/16/2017   Primary generalized (osteo)arthritis 08/16/2017   Melena 12/05/2015   Blood in stool    Benign neoplasm of transverse colon    Benign neoplasm of cecum    Ceratitis 08/01/2013   Keratitis sicca ([place]) 08/01/2013   Essential hypertension 05/17/2013   Cystocele, midline 03/06/2013   Demoralization and apathy 03/06/2013   Atypical migraine 03/06/2013   Hyperlipidemia 03/06/2013   Jittery 03/06/2013   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   Atrophic vaginitis 03/06/2013   Inflammation of sacroiliac joint ([place]) 03/06/2013   Avitaminosis D 03/06/2013   Allergic rhinitis due to pollen 03/06/2013   Body mass index between 19-24, adult 03/06/2013   Breast screening 03/06/2013   Other extrapyramidal disease and abnormal movement disorder 03/06/2013   Screening for depression 03/06/2013   PCP:  Jonetta Osgood, NP Pharmacy:   [place], [place] - [address] [place] [address] Phone: [phone] Fax: [phone]     Social Determinants of Health (SDOH) Interventions    Readmission Risk Interventions     View : No data to display.

## 2021-12-01 NOTE — Progress Notes (Signed)
Patient ambulated.  97% resting 02 and 95% with ambulation.  Switched to room air.

## 2021-12-01 NOTE — Progress Notes (Signed)
Progress Note   Patient: Kristen Pennington XVQ:[id number] DOB: 01-Sep-1928 DOA: 11/30/2021     1 DOS: the patient was seen and examined on 12/01/2021   Brief hospital course: Taken from H&P.  Kristen Pennington is a 88 y.o. female with medical history significant of chronic pain syndrome mainly due to chronic low back pain and bilateral hip pain, hypertension, hyperlipidemia, OSA not on CPAP, depression, anxiety, CKD-3A, who presents with rash and a cough.   Pt reports that she developed diffused erythematous rash in her legs, arms and torso.  She could not tell exactly when the rash started, possibly 1 or 2 days.  She does not feel itchy or pain.  No oral mucosal involvement.  Of note, patient was newly started on Celebrex for chronic pain, Phenergan for nausea and Lexapro for depression on 5/18.  Patient has no fever or chills.   She reports cough, chest tightness, mild shortness of breath today.  Patient was found to have oxygen desaturating to 88% on room air, which improved to 96% on 3 L oxygen.  Patient is not using oxygen normally.  She was recently treated with antibiotics for UTI.  She states that her UTI symptoms has resolved.  She does not remember what antibiotics she took for UTI.  Patient has nausea for more than a week, no vomiting, diarrhea or abdominal pain. She complains of chronic lower back pain and bilateral hip pain. No injury.   Data Reviewed and ED Course: pt was found to have WBC 9.7, BNP 179, troponin level 6, INR 1.1, potassium 3.1, abnormal liver function (ALP 67, AST 156, ALT 98, total bilirubin 1.3), negative urinalysis, negative COVID PCR, worsening renal function, temperature normal, blood pressure 117/48, heart rate 91, RR 20, chest x-ray showed possible right middle lobe and lingula opacity.     EKG: I have personally reviewed.  Sinus rhythm, QTc 435, low voltage, nonspecific T wave change  Admitted for concern of With new oxygen requirement.  Chest x-ray concerning  for possible right middle lobe and lingular infiltrate.  No fever or leukocytosis.  Mildly elevated BNP at 179. She was started on ceftriaxone and Zithromax.  Procalcitonin elevated at 12.36.  CRP elevated at 7.3.  Echocardiogram ordered to rule out CHF and it was normal, normal EF and indeterminate diastolic function.  Blood and sputum cultures pending. CBC today with leukocytosis at 11.6.  Slight worsening of BUN at 48 and creatinine at 1.48.   Assessment and Plan: * CAP (community acquired pneumonia) Possible CAP: Patient has cough and mild shortness of breath, with 3 L of new oxygen requirement.  Etiology is not clear.  Chest x-ray showed possible right middle lobe and lingula infiltration, indicating possible pneumonia, but the patient does not have fever or leukocytosis.  Mildly elevated BNP at 176 and AKI.  Procalcitonin elevated at 12.37. She was started on ceftriaxone and Zithromax for concern of pneumonia. Strep pneumo negative and Legionella pending.  Blood and sputum cultures pending. -Continue IV Rocephin and azithromycin - Mucinex for cough  - Bronchodilators -Continue with supportive care. -Continue supplemental oxygen-wean as tolerated  Rash Etiology is not clear.  Possibly due to drug reaction -Hold newly started medications including Celebrex, Phenergan, Lexapro -Prednisone 20 mg daily -As needed Benadryl  Hypokalemia Resolved with repletion.  Magnesium within normal limit. -Continue to monitor and replete as needed  Abnormal LFTs Etiology is not acute.  Patient has nausea, but no abdominal pain, fever or chills. Right upper quadrant ultrasound was  without any significant abnormality.  Hepatitis panel negative. -Continue to monitor -Avoid using Tylenol  Acute renal failure superimposed on stage 3a chronic kidney disease ([name]) Baseline creatinine 1.04 on 09/22/2021.  Her creatinine is 1.37>>1.48, BUN 48. -Give her some gentle IV fluid -Hold HCTZ. Change ZIAC to  bisoprolol 5 mg daily -hold lasix -Monitor renal function -Avoid nephrotoxins  Chronic pain syndrome Chronic pain syndrome mainly due to chronic back pain and bilateral hip pain: -Hold newly started Celebrex -Continue as needed tramadol, as needed Flexeril  Chronic low back pain (Midline) w/o sciatica See above  Osteoarthritis of hips (Bilateral) See above  Depression with anxiety - Hold Lexapro -Continue home trazodone at bedtime  Essential hypertension - IV hydralazine as needed -Bisoprolol -Hold Lasix and HCTZ as above   Subjective: Patient continued to have some cough, per patient cough for the past 3 to 4 days.  No baseline oxygen use.  She does not remember the name of the antibiotics she recently took for UTI.  Physical Exam: Vitals:   11/30/21 2001 12/01/21 0347 12/01/21 0500 12/01/21 0800  BP: (!) 111/55 (!) 111/55  (!) 146/59  Pulse: 66 67  63  Resp: 16 16    Temp: 98.6 F (37 C) 98.4 F (36.9 C)  98.4 F (36.9 C)  TempSrc:      SpO2: 97% 99%  98%  Weight:   57.6 kg   Height:       General.  Frail elderly lady, in no acute distress. Pulmonary.  Lungs clear bilaterally, normal respiratory effort. CV.  Regular rate and rhythm, no JVD, rub or murmur. Abdomen.  Soft, nontender, nondistended, BS positive. CNS.  Alert and oriented .  No focal neurologic deficit. Extremities.  No edema, no cyanosis, pulses intact and symmetrical.  Mild erythematous rash involving bilateral lower legs. Psychiatry.  Judgment and insight appears normal.  Data Reviewed: Prior notes, labs and images reviewed  Family Communication: Discussed with daughter on phone.  Disposition: Status is: Inpatient Remains inpatient appropriate because: severity of illness   Planned Discharge Destination: Home  DVT prophylaxis. Lovenox Time spent: 50 minutes  This record has been created using Systems analyst. Errors have been sought and corrected,but may not always be  located. Such creation errors do not reflect on the standard of care.  Author: Lorella Nimrod, MD 12/01/2021 1:58 PM  For on call review [email or web address].

## 2021-12-01 NOTE — Progress Notes (Signed)
Pt requested for RN to contact provider due to restless legs and unable to rest due to legs restless. Messaged provider K. Foust NP per pt request and checked Home med hx. Pt takes requip at home. Per home regimen pt takes 1 in am and 2 at bedtime. Provider recommended give prn flexeril and tramadol.   12/01/21 2100  Notify: Provider  Provider Name/Title K. Foust NP  Date Provider Notified 12/01/21  Time Provider Notified 2050  Method of Notification  (amion)  Notification Reason Requested by patient/family (home requip due to restless leg.)  Provider response Other (Comment) (Give prn flexeril and tramadol.)  Date of Provider Response 12/01/21  Time of Provider Response 2100

## 2021-12-01 NOTE — Progress Notes (Signed)
*  PRELIMINARY RESULTS* Echocardiogram 2D Echocardiogram has been performed.  Kristen Pennington 12/01/2021, 9:33 AM

## 2021-12-01 NOTE — Hospital Course (Addendum)
Taken from H&P.  Kristen Pennington is a 88 y.o. female with medical history significant of chronic pain syndrome mainly due to chronic low back pain and bilateral hip pain, hypertension, hyperlipidemia, OSA not on CPAP, depression, anxiety, CKD-3A, who presents with rash and a cough.   Pt reports that she developed diffused erythematous rash in her legs, arms and torso.  She could not tell exactly when the rash started, possibly 1 or 2 days.  She does not feel itchy or pain.  No oral mucosal involvement.  Of note, patient was newly started on Celebrex for chronic pain, Phenergan for nausea and Lexapro for depression on 5/18.  Patient has no fever or chills.   She reports cough, chest tightness, mild shortness of breath today.  Patient was found to have oxygen desaturating to 88% on room air, which improved to 96% on 3 L oxygen.  Patient is not using oxygen normally.  She was recently treated with antibiotics for UTI.  She states that her UTI symptoms has resolved.  She does not remember what antibiotics she took for UTI.  Patient has nausea for more than a week, no vomiting, diarrhea or abdominal pain. She complains of chronic lower back pain and bilateral hip pain. No injury.   Data Reviewed and ED Course: pt was found to have WBC 9.7, BNP 179, troponin level 6, INR 1.1, potassium 3.1, abnormal liver function (ALP 67, AST 156, ALT 98, total bilirubin 1.3), negative urinalysis, negative COVID PCR, worsening renal function, temperature normal, blood pressure 117/48, heart rate 91, RR 20, chest x-ray showed possible right middle lobe and lingula opacity.     EKG: I have personally reviewed.  Sinus rhythm, QTc 435, low voltage, nonspecific T wave change  Admitted for concern of With new oxygen requirement.  Chest x-ray concerning for possible right middle lobe and lingular infiltrate.  No fever or leukocytosis.  Mildly elevated BNP at 179. She was started on ceftriaxone and Zithromax.  Procalcitonin  elevated at 12.36.  CRP elevated at 7.3.  Echocardiogram ordered to rule out CHF and it was normal, normal EF and indeterminate diastolic function.  Blood and sputum cultures pending. CBC today with leukocytosis at 11.6.  Slight worsening of BUN at 48 and creatinine at 1.48.  5/23: Patient continued to improve.  She was having some back pain which seems chronic, stating that it is due to hospital bed.  She was also complaining of some crampy abdominal pain, did not had any bowel movement yet, stating that she is constipation prone and normally gets these crampy pains when she has to go.  Procalcitonin started trending down at 7.97. Patient live in a retirement community, apparently there is an outbreak and multiple people that are sick.  Facility will not be able to take her back until tomorrow morning. Patient would like discharge as early as possible tomorrow.

## 2021-12-02 DIAGNOSIS — J189 Pneumonia, unspecified organism: Secondary | ICD-10-CM | POA: Diagnosis not present

## 2021-12-02 LAB — PROCALCITONIN: Procalcitonin: 7.97 ng/mL

## 2021-12-02 LAB — LEGIONELLA PNEUMOPHILA SEROGP 1 UR AG: L. pneumophila Serogp 1 Ur Ag: NEGATIVE

## 2021-12-02 MED ORDER — POLYETHYLENE GLYCOL 3350 17 G PO PACK
17.0000 g | PACK | Freq: Every day | ORAL | Status: DC | PRN
  Administered 2021-12-02: 17 g via ORAL
  Filled 2021-12-02: qty 1

## 2021-12-02 MED ORDER — ROPINIROLE HCL 1 MG PO TABS
2.0000 mg | ORAL_TABLET | Freq: Every day | ORAL | Status: DC
Start: 2021-12-02 — End: 2021-12-03
  Administered 2021-12-02: 2 mg via ORAL
  Filled 2021-12-02: qty 2

## 2021-12-02 MED ORDER — AZITHROMYCIN 250 MG PO TABS
500.0000 mg | ORAL_TABLET | Freq: Every day | ORAL | Status: DC
  Administered 2021-12-02 – 2021-12-03 (×2): 500 mg via ORAL
  Filled 2021-12-02 (×2): qty 2

## 2021-12-02 MED ORDER — CEFUROXIME AXETIL 500 MG PO TABS
250.0000 mg | ORAL_TABLET | Freq: Two times a day (BID) | ORAL | Status: DC
  Administered 2021-12-02 – 2021-12-03 (×2): 250 mg via ORAL
  Filled 2021-12-02 (×2): qty 1
  Filled 2021-12-02: qty 0.5
  Filled 2021-12-02: qty 1

## 2021-12-02 NOTE — Progress Notes (Signed)
Progress Note   Patient: Kristen Pennington:[id number] DOB: 02/15/1929 DOA: 11/30/2021     2 DOS: the patient was seen and examined on 12/02/2021   Brief hospital course: Taken from H&P.  Kristen Pennington is a 88 y.o. female with medical history significant of chronic pain syndrome mainly due to chronic low back pain and bilateral hip pain, hypertension, hyperlipidemia, OSA not on CPAP, depression, anxiety, CKD-3A, who presents with rash and a cough.   Pt reports that she developed diffused erythematous rash in her legs, arms and torso.  She could not tell exactly when the rash started, possibly 1 or 2 days.  She does not feel itchy or pain.  No oral mucosal involvement.  Of note, patient was newly started on Celebrex for chronic pain, Phenergan for nausea and Lexapro for depression on 5/18.  Patient has no fever or chills.   She reports cough, chest tightness, mild shortness of breath today.  Patient was found to have oxygen desaturating to 88% on room air, which improved to 96% on 3 L oxygen.  Patient is not using oxygen normally.  She was recently treated with antibiotics for UTI.  She states that her UTI symptoms has resolved.  She does not remember what antibiotics she took for UTI.  Patient has nausea for more than a week, no vomiting, diarrhea or abdominal pain. She complains of chronic lower back pain and bilateral hip pain. No injury.   Data Reviewed and ED Course: pt was found to have WBC 9.7, BNP 179, troponin level 6, INR 1.1, potassium 3.1, abnormal liver function (ALP 67, AST 156, ALT 98, total bilirubin 1.3), negative urinalysis, negative COVID PCR, worsening renal function, temperature normal, blood pressure 117/48, heart rate 91, RR 20, chest x-ray showed possible right middle lobe and lingula opacity.     EKG: I have personally reviewed.  Sinus rhythm, QTc 435, low voltage, nonspecific T wave change  Admitted for concern of With new oxygen requirement.  Chest x-ray concerning  for possible right middle lobe and lingular infiltrate.  No fever or leukocytosis.  Mildly elevated BNP at 179. She was started on ceftriaxone and Zithromax.  Procalcitonin elevated at 12.36.  CRP elevated at 7.3.  Echocardiogram ordered to rule out CHF and it was normal, normal EF and indeterminate diastolic function.  Blood and sputum cultures pending. CBC today with leukocytosis at 11.6.  Slight worsening of BUN at 48 and creatinine at 1.48.  5/23: Patient continued to improve.  She was having some back pain which seems chronic, stating that it is due to hospital bed.  She was also complaining of some crampy abdominal pain, did not had any bowel movement yet, stating that she is constipation prone and normally gets these crampy pains when she has to go.  Procalcitonin started trending down at 7.97. Patient live in a retirement community, apparently there is an outbreak and multiple people that are sick.  Facility will not be able to take her back until tomorrow morning. Patient would like discharge as early as possible tomorrow.    Assessment and Plan: * CAP (community acquired pneumonia) Possible CAP: Patient has cough and mild shortness of breath, with 3 L of new oxygen requirement.  Etiology is not clear.  Chest x-ray showed possible right middle lobe and lingula infiltration, indicating possible pneumonia, but the patient does not have fever or leukocytosis.  Mildly elevated BNP at 176 and AKI.  Procalcitonin elevated at 12.37>>7.97. She was started on ceftriaxone and  Zithromax for concern of pneumonia. Strep pneumo negative and Legionella pending.  Preliminary blood cultures negative. -Switch ceftriaxone with Ceftin to complete a 5-day course. -Switch IV Zithromax to p.o. - Mucinex for cough  - Bronchodilators -Continue with supportive care. -Continue supplemental oxygen-wean as tolerated  Rash Etiology is not clear.  Possibly due to drug reaction.  Improving -Hold newly started  medications including Celebrex, Phenergan, Lexapro -Prednisone 20 mg daily -As needed Benadryl  Hypokalemia Resolved with repletion.  Magnesium within normal limit. -Continue to monitor and replete as needed  Abnormal LFTs Etiology is not acute.  Patient has nausea, but no abdominal pain, fever or chills. Right upper quadrant ultrasound was without any significant abnormality.  Hepatitis panel negative. -Continue to monitor -Avoid using Tylenol  Acute renal failure superimposed on stage 3a chronic kidney disease ([place]) Baseline creatinine 1.04 on 09/22/2021.  Her creatinine is 1.37>>1.48, BUN 48. -Give her some gentle IV fluid -Hold HCTZ. Change ZIAC to bisoprolol 5 mg daily -hold lasix -Monitor renal function -Avoid nephrotoxins  Chronic pain syndrome Chronic pain syndrome mainly due to chronic back pain and bilateral hip pain: -Hold newly started Celebrex -Continue as needed tramadol, as needed Flexeril  Chronic low back pain (Midline) w/o sciatica See above  Osteoarthritis of hips (Bilateral) See above  Depression with anxiety - Hold Lexapro -Continue home trazodone at bedtime  Essential hypertension - IV hydralazine as needed -Bisoprolol -Hold Lasix and HCTZ as above    Subjective: Patient was having some back pain, stating that it is due to bed and pain seems chronic.  She was also experiencing some crampy abdominal pain, stating that she is prone to constipation and did not had a bowel movement for few days.  She normally gets pretty crampy abdominal pain before going for BM.  Physical Exam: Vitals:   12/02/21 0511 12/02/21 0748 12/02/21 0820 12/02/21 1456  BP: (!) 155/64 (!) 142/63 (!) 155/68 (!) 142/70  Pulse: (!) 56 64 (!) 54 66  Resp: 20  20   Temp: 98.6 F (37 C) 97.7 F (36.5 C) 98.1 F (36.7 C) 98.2 F (36.8 C)  TempSrc:  Oral  Oral  SpO2: 97% 95% 94% 93%  Weight:      Height:       General.  Pleasant elderly lady, in no acute  distress. Pulmonary.  Lungs clear bilaterally, normal respiratory effort. CV.  Regular rate and rhythm, no JVD, rub or murmur. Abdomen.  Soft, nontender, nondistended, BS positive. CNS.  Alert and oriented .  No focal neurologic deficit. Extremities.  No edema, no cyanosis, pulses intact and symmetrical. Psychiatry.  Judgment and insight appears normal.  Data Reviewed: Prior notes and labs reviewed  Family Communication:   Disposition: Status is: Inpatient Remains inpatient appropriate because: Severity of illness   Planned Discharge Destination: Home with Home Health  DVT prophylaxis.  Lovenox Time spent: 40 minutes  This record has been created using Systems analyst. Errors have been sought and corrected,but may not always be located. Such creation errors do not reflect on the standard of care.  Author: Lorella Nimrod, MD 12/02/2021 3:15 PM  For on call review [email or web address].

## 2021-12-02 NOTE — Assessment & Plan Note (Signed)
Etiology is not acute.  Patient has nausea, but no abdominal pain, fever or chills. Right upper quadrant ultrasound was without any significant abnormality.  Hepatitis panel negative. -Continue to monitor -Avoid using Tylenol

## 2021-12-02 NOTE — Assessment & Plan Note (Signed)
Baseline creatinine 1.04 on 09/22/2021.  Her creatinine is 1.37>>1.48, BUN 48. -Give her some gentle IV fluid -Hold HCTZ. Change ZIAC to bisoprolol 5 mg daily -hold lasix -Monitor renal function -Avoid nephrotoxins

## 2021-12-02 NOTE — Assessment & Plan Note (Signed)
Possible CAP: Patient has cough and mild shortness of breath, with 3 L of new oxygen requirement.  Etiology is not clear.  Chest x-ray showed possible right middle lobe and lingula infiltration, indicating possible pneumonia, but the patient does not have fever or leukocytosis.  Mildly elevated BNP at 176 and AKI.  Procalcitonin elevated at 12.37>>7.97. She was started on ceftriaxone and Zithromax for concern of pneumonia. Strep pneumo negative and Legionella pending.  Preliminary blood cultures negative. -Switch ceftriaxone with Ceftin to complete a 5-day course. -Switch IV Zithromax to p.o. - Mucinex for cough  - Bronchodilators -Continue with supportive care. -Continue supplemental oxygen-wean as tolerated

## 2021-12-02 NOTE — Assessment & Plan Note (Signed)
Etiology is not clear.  Possibly due to drug reaction.  Improving -Hold newly started medications including Celebrex, Phenergan, Lexapro -Prednisone 20 mg daily -As needed Benadryl

## 2021-12-02 NOTE — Assessment & Plan Note (Signed)
Resolved with repletion.  Magnesium within normal limit. -Continue to monitor and replete as needed

## 2021-12-03 DIAGNOSIS — J189 Pneumonia, unspecified organism: Secondary | ICD-10-CM | POA: Diagnosis not present

## 2021-12-03 LAB — COMPREHENSIVE METABOLIC PANEL
ALT: 125 U/L — ABNORMAL HIGH (ref 0–44)
AST: 66 U/L — ABNORMAL HIGH (ref 15–41)
Albumin: 3.4 g/dL — ABNORMAL LOW (ref 3.5–5.0)
Alkaline Phosphatase: 69 U/L (ref 38–126)
Anion gap: 8 (ref 5–15)
BUN: 29 mg/dL — ABNORMAL HIGH (ref 8–23)
CO2: 31 mmol/L (ref 22–32)
Calcium: 9.8 mg/dL (ref 8.9–10.3)
Chloride: 102 mmol/L (ref 98–111)
Creatinine, Ser: 1.02 mg/dL — ABNORMAL HIGH (ref 0.44–1.00)
GFR, Estimated: 52 mL/min — ABNORMAL LOW (ref >60)
Glucose, Bld: 96 mg/dL (ref 70–99)
Potassium: 4.2 mmol/L (ref 3.5–5.1)
Sodium: 141 mmol/L (ref 135–145)
Total Bilirubin: 0.6 mg/dL (ref 0.3–1.2)
Total Protein: 6.3 g/dL — ABNORMAL LOW (ref 6.5–8.1)

## 2021-12-03 MED ORDER — CEFUROXIME AXETIL 250 MG PO TABS: 250.0000 mg | ORAL_TABLET | Freq: Two times a day (BID) | ORAL | 0 refills | Status: DC

## 2021-12-03 MED ORDER — ENOXAPARIN SODIUM 40 MG/0.4ML IJ SOSY: 40.0000 mg | PREFILLED_SYRINGE | INTRAMUSCULAR | Status: DC

## 2021-12-03 NOTE — Discharge Summary (Signed)
Kristen Pennington DZH:[id number] DOB: 1929/05/02 DOA: 11/30/2021  PCP: Jonetta Osgood, NP  Admit date: 11/30/2021 Discharge date: 12/03/2021  Time spent: 40 minutes  Recommendations for Outpatient Follow-up:  PCP f/u, check LFTs at f/u, also needs check of kidney function at f/u    Discharge Diagnoses:  Principal Problem:   CAP (community acquired pneumonia) Active Problems:   Rash   Hypokalemia   Abnormal LFTs   Acute renal failure superimposed on stage 3a chronic kidney disease (HCC)   Chronic pain syndrome   Chronic low back pain (Midline) w/o sciatica   Osteoarthritis of hips (Bilateral)   Depression with anxiety   Essential hypertension   Discharge Condition: improved  Diet recommendation: regular  Filed Weights   12/01/21 0500 12/02/21 0500 12/03/21 0500  Weight: 57.6 kg 57.6 kg 58 kg    History of present illness:  Kristen Pennington is a 88 y.o. female with medical history significant of chronic pain syndrome mainly due to chronic low back pain and bilateral hip pain, hypertension, hyperlipidemia, OSA not on CPAP, depression, anxiety, CKD-3A, who presents with rash and a cough.   Pt reports that she developed diffused erythematous rash in her legs, arms and torso.  She could not tell exactly when the rash started, possibly 1 or 2 days.  She does not feel itchy or pain.  No oral mucosal involvement.  Of note, patient was newly started on Celebrex for chronic pain, Phenergan for nausea and Lexapro for depression on 5/18.  Patient has no fever or chills.   She reports cough, chest tightness, mild shortness of breath today.  Patient was found to have oxygen desaturating to 88% on room air, which improved to 96% on 3 L oxygen.  Patient is not using oxygen normally.  She was recently treated with antibiotics for UTI.  She states that her UTI symptoms has resolved.  She does not remember what antibiotics she took for UTI.  Patient has nausea for more than a week, no vomiting,  diarrhea or abdominal pain. She complains of chronic lower back pain and bilateral hip pain. No injury.  Hospital Course:  Patient reports with cough, SOB and hypoxia requiring O2 supplementation. Also with new rash. CXR with RML infiltrate. Procal markedly elevated. Treated with ceftriaxone and azithromycin (completed course of azithromycin), transitioned to oral cefitin with plans to finish 5 day course. Symptoms essentially resolved day of discharge. Rash of unclear etiology - did recently start celebrex, phenergan, lexapro. Rash much improved; those new meds will be held, and if re-started would do so one at a time to monitor for rash recurrence. AKI resolved with fluids. LFTs also mildly elevated, hepatitis panel negative, also improving with fluids so think likely 2/2 hypoperfusion. D/c instructions reviewed with daughter.   Procedures: none   Consultations: none  Discharge Exam: Vitals:   12/03/21 0208 12/03/21 0720  BP: (!) 131/51 (!) 160/81  Pulse: 60 (!) 49  Resp: 18   Temp: 98 F (36.7 C) 97.7 F (36.5 C)  SpO2: 94% 95%    General: NAD Cardiovascular: RRR Respiratory: CTAB ABdomen: soft, non-tender Ext: no edema  Discharge Instructions   Discharge Instructions     Diet - low sodium heart healthy   Complete by: As directed    Increase activity slowly   Complete by: As directed       Allergies as of 12/03/2021   No Known Allergies      Medication List     STOP taking these medications  celecoxib 50 MG capsule Commonly known as: CELEBREX   escitalopram 20 MG tablet Commonly known as: LEXAPRO   promethazine 12.5 MG tablet Commonly known as: PHENERGAN       TAKE these medications    acetaminophen 500 MG tablet Commonly known as: TYLENOL Take 500 mg by mouth every 6 (six) hours as needed.   bisoprolol-hydrochlorothiazide 5-6.25 MG tablet Commonly known as: ZIAC Take 1 tablet by mouth daily.   cefUROXime 250 MG tablet Commonly known as:  CEFTIN Take 1 tablet (250 mg total) by mouth 2 (two) times daily with a meal.   Centrum Silver 50+Women Tabs Take by mouth.   cyclobenzaprine 10 MG tablet Commonly known as: FLEXERIL TAKE 1 TABLET BY MOUTH AT BEDTIME FOR BACK SPASMS AS NEEDED ONLY. What changed: See the new instructions.   estradiol 0.1 MG/GM vaginal cream Commonly known as: ESTRACE Use once a week as needed   fluticasone 50 MCG/ACT nasal spray Commonly known as: FLONASE 1 SPRAY IN EACH NOSTRIL ONCE DAILY AS NEEDED What changed:  how much to take how to take this when to take this   furosemide 20 MG tablet Commonly known as: LASIX Take 1 tablet (20 mg total) by mouth daily.   lubiprostone 8 MCG capsule Commonly known as: Amitiza TAKE 1 CAPSULE BY MOUTH ONCE DAILY FOR CONSTIPATION What changed:  how much to take how to take this when to take this   montelukast 10 MG tablet Commonly known as: SINGULAIR Take 1 tablet (10 mg total) by mouth at bedtime.   ondansetron 4 MG tablet Commonly known as: ZOFRAN Take 1 tablet (4 mg total) by mouth every 8 (eight) hours as needed for nausea or vomiting.   polyethylene glycol powder 17 GM/SCOOP powder Commonly known as: GLYCOLAX/MIRALAX Take 17 g by mouth daily as needed for mild constipation or moderate constipation.   rOPINIRole 1 MG tablet Commonly known as: REQUIP Take 1 tablet (1 mg total) by mouth 3 (three) times daily as needed (restless legs). What changed: additional instructions   SALONPAS PAIN RELIEF PATCH EX Apply 1 patch topically 2 (two) times daily.   Systane 0.4-0.3 % Soln Generic drug: Polyethyl Glycol-Propyl Glycol Apply to eye.   traMADol 50 MG tablet Commonly known as: ULTRAM TAKE ONE TABLET TWICE A DAY IF NEEDED FOR SEVERE PAIN   traZODone 50 MG tablet Commonly known as: DESYREL Take 0.5-1 tablets (25-50 mg total) by mouth at bedtime.   Vitamin D3 125 MCG (5000 UT) Tabs Take 5,000 Units by mouth daily.       No Known  Allergies  Follow-up Information     Jonetta Osgood, NP Follow up.   Specialty: Nurse Practitioner Contact information: [address] [place] [address] [phone]                  The results of significant diagnostics from this hospitalization (including imaging, microbiology, ancillary and laboratory) are listed below for reference.    Significant Diagnostic Studies: DG Chest 2 View  Result Date: 11/30/2021 CLINICAL DATA:  Shortness of breath and hypoxia. EXAM: CHEST - 2 VIEW COMPARISON:  None Available. FINDINGS: The cardiomediastinal silhouette is unremarkable. Equivocal RIGHT middle lobe versus lingular opacity, best identified on the LATERAL view may represent atelectasis or airspace disease/pneumonia. There is no evidence of pulmonary edema, suspicious pulmonary nodule/mass, pleural effusion, or pneumothorax. No acute bony abnormalities are identified. LEFT shoulder surgical changes noted. IMPRESSION: Equivocal RIGHT middle lobe versus lingular opacity which may represent atelectasis or airspace  disease/pneumonia. Electronically Signed   By: Margarette Canada M.D.   On: 11/30/2021 12:36   ECHOCARDIOGRAM COMPLETE  Result Date: 12/01/2021    ECHOCARDIOGRAM REPORT   Patient Name:   BERNIECE ABID Date of Exam: 12/01/2021 Medical Rec #:  [id number]         Height:       65.0 in Accession #:    [id number]        Weight:       127.0 lb Date of Birth:  07/22/1928         BSA:          1.631 m Patient Age:    88 years          BP:           146/59 mmHg Patient Gender: F                 HR:           63 bpm. Exam Location:  [facility] Procedure: 2D Echo, Cardiac Doppler and Color Doppler Indications:     CHF-acute diastolic N23.55  History:         Patient has no prior history of Echocardiogram examinations.                  Risk Factors:Sleep Apnea.  Sonographer:     Sherrie Sport Referring Phys:  [place] Diagnosing Phys: Kathlyn Sacramento MD  Sonographer Comments: Suboptimal parasternal  window. IMPRESSIONS  1. Left ventricular ejection fraction, by estimation, is 60 to 65%. The left ventricle has normal function. The left ventricle has no regional wall motion abnormalities. There is mild left ventricular hypertrophy. Left ventricular diastolic parameters are indeterminate.  2. Right ventricular systolic function is normal. The right ventricular size is normal. There is normal pulmonary artery systolic pressure.  3. The mitral valve is normal in structure. No evidence of mitral valve regurgitation. No evidence of mitral stenosis.  4. The aortic valve is normal in structure. Aortic valve regurgitation is not visualized. Aortic valve sclerosis/calcification is present, without any evidence of aortic stenosis.  5. The inferior vena cava is normal in size with greater than 50% respiratory variability, suggesting right atrial pressure of 3 mmHg. FINDINGS  Left Ventricle: Left ventricular ejection fraction, by estimation, is 60 to 65%. The left ventricle has normal function. The left ventricle has no regional wall motion abnormalities. The left ventricular internal cavity size was normal in size. There is  mild left ventricular hypertrophy. Left ventricular diastolic parameters are indeterminate. Right Ventricle: The right ventricular size is normal. No increase in right ventricular wall thickness. Right ventricular systolic function is normal. There is normal pulmonary artery systolic pressure. The tricuspid regurgitant velocity is 2.38 m/s, and  with an assumed right atrial pressure of 3 mmHg, the estimated right ventricular systolic pressure is 73.2 mmHg. Left Atrium: Left atrial size was normal in size. Right Atrium: Right atrial size was normal in size. Pericardium: There is no evidence of pericardial effusion. Mitral Valve: The mitral valve is normal in structure. No evidence of mitral valve regurgitation. No evidence of mitral valve stenosis. MV peak gradient, 7.4 mmHg. The mean mitral valve  gradient is 4.0 mmHg. Tricuspid Valve: The tricuspid valve is normal in structure. Tricuspid valve regurgitation is trivial. No evidence of tricuspid stenosis. Aortic Valve: The aortic valve is normal in structure. Aortic valve regurgitation is not visualized. Aortic valve sclerosis/calcification is present, without any evidence of aortic stenosis. Aortic valve  mean gradient measures 4.5 mmHg. Aortic valve peak  gradient measures 7.7 mmHg. Aortic valve area, by VTI measures 2.83 cm. Pulmonic Valve: The pulmonic valve was normal in structure. Pulmonic valve regurgitation is not visualized. No evidence of pulmonic stenosis. Aorta: The aortic root is normal in size and structure. Venous: The inferior vena cava is normal in size with greater than 50% respiratory variability, suggesting right atrial pressure of 3 mmHg. IAS/Shunts: No atrial level shunt detected by color flow Doppler.  LEFT VENTRICLE PLAX 2D LVIDd:         3.90 cm   Diastology LVIDs:         2.10 cm   LV e' medial:    4.35 cm/s LV PW:         1.10 cm   LV E/e' medial:  28.7 LV IVS:        0.85 cm   LV e' lateral:   3.70 cm/s LVOT diam:     2.00 cm   LV E/e' lateral: 33.8 LV SV:         90 LV SV Index:   55 LVOT Area:     3.14 cm  RIGHT VENTRICLE RV Basal diam:  3.20 cm RV S prime:     14.50 cm/s TAPSE (M-mode): 2.7 cm LEFT ATRIUM             Index        RIGHT ATRIUM           Index LA diam:        3.40 cm 2.08 cm/m   RA Area:     11.30 cm LA Vol (A2C):   31.2 ml 19.13 ml/m  RA Volume:   25.00 ml  15.33 ml/m LA Vol (A4C):   41.3 ml 25.32 ml/m LA Biplane Vol: 36.3 ml 22.26 ml/m  AORTIC VALVE                     PULMONIC VALVE AV Area (Vmax):    2.62 cm      RVOT Peak grad: 6 mmHg AV Area (Vmean):   2.63 cm AV Area (VTI):     2.83 cm AV Vmax:           139.00 cm/s AV Vmean:          102.050 cm/s AV VTI:            0.318 m AV Peak Grad:      7.7 mmHg AV Mean Grad:      4.5 mmHg LVOT Vmax:         116.00 cm/s LVOT Vmean:        85.300 cm/s LVOT  VTI:          0.286 m LVOT/AV VTI ratio: 0.90  AORTA Ao Root diam: 2.70 cm MITRAL VALVE                TRICUSPID VALVE MV Area (PHT): 2.67 cm     TR Peak grad:   22.7 mmHg MV Area VTI:   2.45 cm     TR Vmax:        238.00 cm/s MV Peak grad:  7.4 mmHg MV Mean grad:  4.0 mmHg     SHUNTS MV Vmax:       1.36 m/s     Systemic VTI:  0.29 m MV Vmean:      91.6 cm/s    Systemic Diam: 2.00 cm MV Decel Time: 284 msec MV  E velocity: 125.00 cm/s MV A velocity: 138.00 cm/s MV E/A ratio:  0.91 Kathlyn Sacramento MD Electronically signed by Kathlyn Sacramento MD Signature Date/Time: 12/01/2021/1:01:58 PM    Final    US ABDOMEN LIMITED RUQ (LIVER/GB)  Result Date: 11/30/2021 CLINICAL DATA:  Elevated liver function tests. EXAM: ULTRASOUND ABDOMEN LIMITED RIGHT UPPER QUADRANT COMPARISON:  None Available. FINDINGS: Gallbladder: No gallstones or wall thickening visualized. No sonographic Murphy sign noted by sonographer. Common bile duct: Diameter: 4 mm, within normal limits. Liver: No focal lesion identified. Within normal limits in parenchymal echogenicity. Portal vein is patent on color Doppler imaging with normal direction of blood flow towards the liver. Other: None. IMPRESSION: Negative. No hepatobiliary abnormality identified. Electronically Signed   By: Marlaine Hind M.D.   On: 11/30/2021 17:06    Microbiology: Recent Results (from the past 240 hour(s))  Resp Panel by RT-PCR (Flu A&B, Covid) Nasopharyngeal Swab     Status: None   Collection Time: 11/30/21  2:22 PM   Specimen: Nasopharyngeal Swab; Nasopharyngeal(NP) swabs in vial transport medium  Result Value Ref Range Status   SARS Coronavirus 2 by RT PCR NEGATIVE NEGATIVE Final    Comment: (NOTE) SARS-CoV-2 target nucleic acids are NOT DETECTED.  The SARS-CoV-2 RNA is generally detectable in upper respiratory specimens during the acute phase of infection. The lowest concentration of SARS-CoV-2 viral copies this assay can detect is 138 copies/mL. A negative result  does not preclude SARS-Cov-2 infection and should not be used as the sole basis for treatment or other patient management decisions. A negative result may occur with  improper specimen collection/handling, submission of specimen other than nasopharyngeal swab, presence of viral mutation(s) within the areas targeted by this assay, and inadequate number of viral copies(<138 copies/mL). A negative result must be combined with clinical observations, patient history, and epidemiological information. The expected result is Negative.  Fact Sheet for Patients:  EntrepreneurPulse.com.au  Fact Sheet for Healthcare Providers:  IncredibleEmployment.be  This test is no t yet approved or cleared by the [place] FDA and  has been authorized for detection and/or diagnosis of SARS-CoV-2 by FDA under an Emergency Use Authorization (EUA). This EUA will remain  in effect (meaning this test can be used) for the duration of the COVID-19 declaration under Section 564(b)(1) of the Act, 21 U.S.C.section 360bbb-3(b)(1), unless the authorization is terminated  or revoked sooner.       Influenza A by PCR NEGATIVE NEGATIVE Final   Influenza B by PCR NEGATIVE NEGATIVE Final    Comment: (NOTE) The Xpert Xpress SARS-CoV-2/FLU/RSV plus assay is intended as an aid in the diagnosis of influenza from Nasopharyngeal swab specimens and should not be used as a sole basis for treatment. Nasal washings and aspirates are unacceptable for Xpert Xpress SARS-CoV-2/FLU/RSV testing.  Fact Sheet for Patients: EntrepreneurPulse.com.au  Fact Sheet for Healthcare Providers: IncredibleEmployment.be  This test is not yet approved or cleared by the [place] FDA and has been authorized for detection and/or diagnosis of SARS-CoV-2 by FDA under an Emergency Use Authorization (EUA). This EUA will remain in effect (meaning this test can be used) for  the duration of the COVID-19 declaration under Section 564(b)(1) of the Act, 21 U.S.C. section 360bbb-3(b)(1), unless the authorization is terminated or revoked.  Performed at [facility], [address] [place], [place] [address]   Culture, blood (routine x 2) Call MD if unable to obtain prior to antibiotics being given     Status: None (Preliminary result)   Collection  Time: 12/01/21  5:53 AM   Specimen: BLOOD  Result Value Ref Range Status   Specimen Description BLOOD RIGHT HAND  Final   Special Requests   Final    BOTTLES DRAWN AEROBIC AND ANAEROBIC Blood Culture adequate volume   Culture   Final    NO GROWTH 2 DAYS Performed at [facility], [address] [place], [place] [address]    Report Status PENDING  Incomplete  Culture, blood (routine x 2) Call MD if unable to obtain prior to antibiotics being given     Status: None (Preliminary result)   Collection Time: 12/01/21  5:53 AM   Specimen: BLOOD  Result Value Ref Range Status   Specimen Description BLOOD LEFT HAND  Final   Special Requests   Final    BOTTLES DRAWN AEROBIC AND ANAEROBIC Blood Culture adequate volume   Culture   Final    NO GROWTH 2 DAYS Performed at [facility], [address] [place], [place] [address]    Report Status PENDING  Incomplete     Labs: Basic Metabolic Panel: Recent Labs  Lab 11/30/21 1205 11/30/21 2028 12/01/21 0526 12/01/21 1430 12/03/21 0426  NA 139  --  138 136 141  K 3.1*  --  4.2 5.0 4.2  CL 97*  --  97* 93* 102  CO2 32  --  33* 33* 31  GLUCOSE 95  --  113* 142* 96  BUN 39*  --  48* 43* 29*  CREATININE 1.37*  --  1.48* 1.35* 1.02*  CALCIUM 9.6  --  9.1 10.3 9.8  MG  --  2.1  --   --   --    Liver Function Tests: Recent Labs  Lab 11/30/21 1158 12/01/21 1430 12/03/21 0426  AST 156* 174* 66*  ALT 90* 169* 125*  ALKPHOS 67 91 69  BILITOT 1.3* 0.6 0.6  PROT 6.7 7.5 6.3*  ALBUMIN 3.5 3.7 3.4*   No results for input(s): LIPASE,  AMYLASE in the last 168 hours. No results for input(s): AMMONIA in the last 168 hours. CBC: Recent Labs  Lab 11/30/21 1205 12/01/21 0526  WBC 9.7 11.6*  HGB 12.8 11.1*  HCT 40.2 34.5*  MCV 95.5 96.4  PLT 245 230   Cardiac Enzymes: No results for input(s): CKTOTAL, CKMB, CKMBINDEX, TROPONINI in the last 168 hours. BNP: BNP (last 3 results) Recent Labs    11/30/21 1158  BNP 179.2*    ProBNP (last 3 results) No results for input(s): PROBNP in the last 8760 hours.  CBG: No results for input(s): GLUCAP in the last 168 hours.     Signed:  Desma Maxim MD.  [facility] 12/03/2021, 9:57 AM

## 2021-12-03 NOTE — Discharge Summary (Incomplete)
Discharge instructions reviewed with patient at the bedside. PIV removed with no complications. Patient states she has no further questions or concerns at this time. Patient will be leaving via car with her friend phylis

## 2021-12-03 NOTE — Plan of Care (Signed)
  Problem: Education: Goal: Ability to demonstrate management of disease process will improve Outcome: Progressing Goal: Ability to verbalize understanding of medication therapies will improve Outcome: Progressing   Problem: Education: Goal: Ability to verbalize understanding of medication therapies will improve Outcome: Progressing   

## 2021-12-03 NOTE — Care Management Important Message (Signed)
Important Message  Patient Details  Name: XARENI KELCH MRN: [id number] Date of Birth: [birth year]   Medicare Important Message Given:  N/A - LOS <3 / Initial given by admissions     Dannette Barbara 12/03/2021, 8:31 AM

## 2021-12-03 NOTE — Progress Notes (Signed)
PHARMACIST - PHYSICIAN COMMUNICATION  CONCERNING:  Enoxaparin (Lovenox) for DVT Prophylaxis    RECOMMENDATION: Patient was prescribed enoxaprin '30mg'$  q24 hours for VTE prophylaxis.   Filed Weights   12/01/21 0500 12/02/21 0500 12/03/21 0500  Weight: 57.6 kg (126 lb 15.8 oz) 57.6 kg (126 lb 15.8 oz) 58 kg (127 lb 13.9 oz)    Body mass index is 21.28 kg/m.  Estimated Creatinine Clearance: 33 mL/min (A) (by C-G formula based on SCr of 1.02 mg/dL (H)).   Based on [place] patient is candidate for enoxaparin '40mg'$  every 24 hours based on CrCl now >40m/min or Weight <45kg  DESCRIPTION: [facility]  Patient is now receiving enoxaparin 40 mg every 24 hours    KChinita GreenlandPharmD Clinical Pharmacist 12/03/2021

## 2021-12-03 NOTE — Plan of Care (Signed)
°  Problem: Education: °Goal: Ability to demonstrate management of disease process will improve °Outcome: Progressing °Goal: Ability to verbalize understanding of medication therapies will improve °Outcome: Progressing °Goal: Individualized Educational Video(s) °Outcome: Progressing °  °Problem: Activity: °Goal: Capacity to carry out activities will improve °Outcome: Progressing °  °Problem: Cardiac: °Goal: Ability to achieve and maintain adequate cardiopulmonary perfusion will improve °Outcome: Progressing °  °Problem: Activity: °Goal: Ability to tolerate increased activity will improve °Outcome: Progressing °  °Problem: Clinical Measurements: °Goal: Ability to maintain a body temperature in the normal range will improve °Outcome: Progressing °  °Problem: Respiratory: °Goal: Ability to maintain adequate ventilation will improve °Outcome: Progressing °Goal: Ability to maintain a clear airway will improve °Outcome: Progressing °  °Problem: Education: °Goal: Knowledge of General Education information will improve °Description: Including pain rating scale, medication(s)/side effects and non-pharmacologic comfort measures °Outcome: Progressing °  °Problem: Health Behavior/Discharge Planning: °Goal: Ability to manage health-related needs will improve °Outcome: Progressing °  °Problem: Clinical Measurements: °Goal: Ability to maintain clinical measurements within normal limits will improve °Outcome: Progressing °Goal: Will remain free from infection °Outcome: Progressing °Goal: Diagnostic test results will improve °Outcome: Progressing °Goal: Respiratory complications will improve °Outcome: Progressing °Goal: Cardiovascular complication will be avoided °Outcome: Progressing °  °Problem: Activity: °Goal: Risk for activity intolerance will decrease °Outcome: Progressing °  °Problem: Nutrition: °Goal: Adequate nutrition will be maintained °Outcome: Progressing °  °Problem: Coping: °Goal: Level of anxiety will  decrease °Outcome: Progressing °  °Problem: Elimination: °Goal: Will not experience complications related to bowel motility °Outcome: Progressing °Goal: Will not experience complications related to urinary retention °Outcome: Progressing °  °Problem: Pain Managment: °Goal: General experience of comfort will improve °Outcome: Progressing °  °Problem: Safety: °Goal: Ability to remain free from injury will improve °Outcome: Progressing °  °Problem: Skin Integrity: °Goal: Risk for impaired skin integrity will decrease °Outcome: Progressing °  °

## 2021-12-03 NOTE — TOC Initial Note (Signed)
Transition of Care [facility]) - Initial/Assessment Note    Patient Details  Name: Kristen Pennington MRN: [id number] Date of Birth: 25-Jul-1928  Transition of Care [facility]) CM/SW Contact:    Shelbie Hutching, RN Phone Number: 12/03/2021, 9:37 AM  Clinical Narrative:                 Patient admitted to the hospital for pneumonia.  Patient is from home she lives in an apartment independently.  Patient is medically cleared for discharge today.  Patient has a friend that she can call to pick her up.  Daughter, Kristen Pennington, lives in [place] area and is aware of discharge today.    Expected Discharge Plan: Home/Self Care Barriers to Discharge: Barriers Resolved   Patient Goals and CMS Choice        Expected Discharge Plan and Services Expected Discharge Plan: Home/Self Care   Discharge Planning Services: CM Consult   Living arrangements for the past 2 months: Apartment                 DME Arranged: N/A DME Agency: NA       HH Arranged: NA HH Agency: NA        Prior Living Arrangements/Services Living arrangements for the past 2 months: Apartment Lives with:: Self Patient language and need for interpreter reviewed:: No Do you feel safe going back to the place where you live?: Yes      Need for Family Participation in Patient Care: Yes (Comment) Care giver support system in place?: Yes (comment)   Criminal Activity/Legal Involvement Pertinent to Current Situation/Hospitalization: No - Comment as needed  Activities of Daily Living Home Assistive Devices/Equipment: None ADL Screening (condition at time of admission) Patient's cognitive ability adequate to safely complete daily activities?: Yes Is the patient deaf or have difficulty hearing?: No Does the patient have difficulty seeing, even when wearing glasses/contacts?: No Does the patient have difficulty concentrating, remembering, or making decisions?: No Patient able to express need for assistance with ADLs?: Yes Does the patient  have difficulty dressing or bathing?: No Independently performs ADLs?: Yes (appropriate for developmental age) Does the patient have difficulty walking or climbing stairs?: No Weakness of Legs: None Weakness of Arms/Hands: None  Permission Sought/Granted Permission sought to share information with : Case Manager, Family Supports Permission granted to share information with : Yes, Verbal Permission Granted  Share Information with NAME: [name]     Permission granted to share info w Relationship: daughter  Permission granted to share info w Contact Information: [phone]  Emotional Assessment       Orientation: : Oriented to Self, Oriented to Place, Oriented to  Time, Oriented to Situation Alcohol / Substance Use: Not Applicable Psych Involvement: No (comment)  Admission diagnosis:  Drug eruption [L27.0] CAP (community acquired pneumonia) [J18.9] Acute respiratory failure with hypoxia ([place]) [J96.01] Community acquired pneumonia of right middle lobe of lung [J18.9] Patient Active Problem List   Diagnosis Date Noted   CAP (community acquired pneumonia) 11/30/2021   Rash 11/30/2021   Hypokalemia 11/30/2021   Abnormal LFTs 11/30/2021   Acute renal failure superimposed on stage 3a chronic kidney disease ([place]) 11/30/2021   Depression with anxiety 11/30/2021   Abnormal involuntary movements 06/11/2021   Elevated blood pressure reading 03/12/2021   Insomnia due to medical condition 03/11/2021   Muscle strain of left upper back 12/22/2020   Memory problem 08/29/2020   Chronic shoulder pain (Left) 06/03/2020   Arthralgia of shoulder region (Left) 06/03/2020  Osteoarthritis of shoulder (Left) 06/03/2020   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   Acute shoulder pain (Left) 05/30/2020   Chronic low back pain (Midline) w/o sciatica 05/15/2020   Spinal enthesopathy of lumbar region [facility]) 05/15/2020   Kissing spine syndrome 05/06/2020   Long term prescription opiate use  04/29/2020   Encounter for general adult medical examination with abnormal findings 03/20/2020   Hyperkalemia 03/20/2020   Hypercalcemia 03/20/2020   Abnormal kidney function 03/20/2020   Abnormal MRI, lumbar spine (01/08/2020) 02/14/2020   Lumbar facet hypertrophy 02/14/2020   MDD (major depressive disorder), recurrent, in full remission ([place]) 12/18/2019   Osteoarthritis of hips (Bilateral) 11/20/2019   Lumbar facet arthropathy 11/20/2019   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/2019   Primary osteoarthritis involving multiple joints 11/20/2019   OSA on CPAP 10/26/2019   MDD (major depressive disorder), recurrent, in partial remission ([place]) 09/19/2019   Bereavement 09/19/2019   Chronic pain syndrome 08/21/2019   Pharmacologic therapy 08/21/2019   Disorder of skeletal system 08/21/2019   Problems influencing health status 08/21/2019   History of lumbar spinal fusion 08/21/2019   Chronic hip pain (2ry area of Pain) (Bilateral) (R>L) 08/21/2019   Lumbar facet syndrome (Bilateral) 08/21/2019   Dysuria 07/02/2019   Delusional disorder ([place]) 03/08/2019   MDD (major depressive disorder), recurrent episode, mild ([place]) 01/18/2019   Insomnia due to mental condition 01/18/2019   History of delusional disorder 01/18/2019   Gastroesophageal reflux disease without esophagitis 11/27/2018   Irritable bowel syndrome with constipation 11/27/2018   Chronic low back pain (1ry area of Pain) (Bilateral) (R>L) w/o sciatica 08/17/2018   Vitamin B12 deficiency 08/17/2018   Elevated TSH 08/17/2018   Urinary tract infection without hematuria 08/16/2017   Generalized anxiety disorder 08/16/2017   Primary generalized (osteo)arthritis 08/16/2017   Melena 12/05/2015   Blood in stool    Benign neoplasm of transverse colon    Benign neoplasm of cecum    Ceratitis 08/01/2013   Keratitis sicca ([employer]) 08/01/2013   Essential hypertension 05/17/2013   Cystocele, midline 03/06/2013   Demoralization and  apathy 03/06/2013   Atypical migraine 03/06/2013   Hyperlipidemia 03/06/2013   Jittery 03/06/2013   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

## 2021-12-05 ENCOUNTER — Encounter: Payer: Self-pay | Admitting: Nurse Practitioner

## 2021-12-05 ENCOUNTER — Ambulatory Visit (INDEPENDENT_AMBULATORY_CARE_PROVIDER_SITE_OTHER): Payer: Medicare HMO | Admitting: Nurse Practitioner

## 2021-12-05 VITALS — BP 130/60 | HR 62 | Temp 97.6°F | Resp 16 | Ht 65.0 in | Wt 127.4 lb

## 2021-12-05 DIAGNOSIS — R7989 Other specified abnormal findings of blood chemistry: Secondary | ICD-10-CM

## 2021-12-05 DIAGNOSIS — E782 Mixed hyperlipidemia: Secondary | ICD-10-CM | POA: Diagnosis not present

## 2021-12-05 DIAGNOSIS — E876 Hypokalemia: Secondary | ICD-10-CM

## 2021-12-05 DIAGNOSIS — D631 Anemia in chronic kidney disease: Secondary | ICD-10-CM

## 2021-12-05 DIAGNOSIS — J189 Pneumonia, unspecified organism: Secondary | ICD-10-CM

## 2021-12-05 DIAGNOSIS — E559 Vitamin D deficiency, unspecified: Secondary | ICD-10-CM

## 2021-12-05 DIAGNOSIS — F411 Generalized anxiety disorder: Secondary | ICD-10-CM

## 2021-12-05 DIAGNOSIS — N1831 Chronic kidney disease, stage 3a: Secondary | ICD-10-CM

## 2021-12-05 DIAGNOSIS — R21 Rash and other nonspecific skin eruption: Secondary | ICD-10-CM | POA: Diagnosis not present

## 2021-12-05 DIAGNOSIS — I10 Essential (primary) hypertension: Secondary | ICD-10-CM | POA: Diagnosis not present

## 2021-12-05 DIAGNOSIS — Z09 Encounter for follow-up examination after completed treatment for conditions other than malignant neoplasm: Secondary | ICD-10-CM | POA: Diagnosis not present

## 2021-12-05 DIAGNOSIS — F331 Major depressive disorder, recurrent, moderate: Secondary | ICD-10-CM

## 2021-12-05 DIAGNOSIS — R748 Abnormal levels of other serum enzymes: Secondary | ICD-10-CM | POA: Diagnosis not present

## 2021-12-05 NOTE — Progress Notes (Signed)
[facility] [address], [address] [place] [id number] [facility] Discharge Acute Issues Care Follow Up                                                                        Patient Demographics  Kristen Pennington, is a 88 y.o. female  DOB 10/01/1928  MRN [id number].  Primary MD  Jonetta Osgood, NP   Reason for TCC follow Up - community-acquired pneumonia; drug eruption   Past Medical History:  Diagnosis Date   Arthritis    Atrophic vaginitis 03/06/2013   Last Assessment & Plan:  She will plan to continue estradiol vaginal cream.  Prescription was rewritten stipulating use of the generic product.    Atypical migraine 03/06/2013   Last Assessment & Plan:  Since she rarely takes sumatriptan, we will discontinue it. Continue sparing use of OTC migraine products.    Avitaminosis D 03/06/2013   Last Assessment & Plan:  Recheck vitamin D level. Plan to continue vitamin D supplementation.    Ceratitis 08/01/2013   Last Assessment & Plan:  Because of the expense, she will discontinue Restasis. She will use over-the-counter moisturizing eyedrops and liquid gel.    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    Cystocele, midline 03/06/2013   Demoralization and apathy 03/06/2013   Last Assessment & Plan:  Since she has been taking bupropion only once a day, I will write the prescription with the correct instructions and correct quantity. She has done well on this for years and she is encouraged to take it regularly    Depression    Environmental allergies    Epistaxis    Essential (primary) hypertension 05/17/2013   Last Assessment & Plan:  Her  blood pressure is well-controlled. We will plan to continue hydrochlorothiazide and benazepril.  Both are generic and should be affordable on her health plan.    GERD (gastroesophageal reflux disease)    Hemorrhoid    Inflammation of sacroiliac joint ([place]) 03/06/2013   Last Assessment & Plan:  She is doing well on her present regimen of fentanyl patch supplemented with sparing use of tramadol/acetaminophen. We will continue that regimen.    Sinusitis    Sleep apnea    CPAP    Past Surgical History:  Procedure Laterality Date   COLONOSCOPY  05-03-15   COLONOSCOPY WITH PROPOFOL N/A 05/03/2015   Procedure: COLONOSCOPY WITH PROPOFOL;  Surgeon: Lucilla Lame, MD;  Location: [place];  Service: Endoscopy;  Laterality: N/A;  CPAP   DILATION AND CURETTAGE OF UTERUS  1970   ENDOMETRIAL BIOPSY     EYE SURGERY Right 2017   POLYPECTOMY  05/03/2015   Procedure: POLYPECTOMY;  Surgeon: Lucilla Lame, MD;  Location: [facility];  Service: Endoscopy;;   ROTATOR CUFF REPAIR  2007   SPINE SURGERY         Recent HPI and Hospital Course  History of present illness:  Kristen Pennington is a 88 y.o. female with medical history significant of chronic pain syndrome mainly due to chronic low back pain and bilateral hip pain, hypertension, hyperlipidemia, OSA not on CPAP, depression, anxiety, CKD-3A, who presents with rash and a cough.   Pt reports that she developed diffused erythematous rash in her legs, arms and torso.  She could not tell exactly when the rash started, possibly 1 or 2 days.  She does not feel itchy or pain.  No oral mucosal involvement.  Of note, patient was newly started on Celebrex for chronic pain, Phenergan for nausea and Lexapro for depression on 5/18.  Patient has no fever or chills.   She reports cough, chest tightness, mild shortness of breath today.  Patient was found to have oxygen desaturating to 88% on room air, which improved to 96% on 3 L oxygen.  Patient is not  using oxygen normally.  She was recently treated with antibiotics for UTI.  She states that her UTI symptoms has resolved.  She does not remember what antibiotics she took for UTI.  Patient has nausea for more than a week, no vomiting, diarrhea or abdominal pain. She complains of chronic lower back pain and bilateral hip pain. No injury.   Hospital Course:  Patient reports with cough, SOB and hypoxia requiring O2 supplementation. Also with new rash. CXR with RML infiltrate. Procal markedly elevated. Treated with ceftriaxone and azithromycin (completed course of azithromycin), transitioned to oral cefitin with plans to finish 5 day course. Symptoms essentially resolved day of discharge. Rash of unclear etiology - did recently start celebrex, phenergan, lexapro. Rash much improved; those new meds will be held, and if re-started would do so one at a time to monitor for rash recurrence. AKI resolved with fluids. LFTs also mildly elevated, hepatitis panel negative, also improving with fluids so think likely 2/2 hypoperfusion. D/c instructions reviewed with daughter.     [facility] Acute Care Issue to be followed in the Clinic   Principal Problem:   CAP (community acquired pneumonia) Active Problems:   Rash   Hypokalemia   Abnormal LFTs   Acute renal failure superimposed on stage 3a chronic kidney disease (HCC)   Chronic pain syndrome   Chronic low back pain (Midline) w/o sciatica   Osteoarthritis of hips (Bilateral)   Depression with anxiety   Essential hypertension   Subjective:   Kristen Pennington today has, No headache, No chest pain, No abdominal pain - No Nausea, No new weakness tingling or numbness, No Cough reports that she is still having some SOB and feels fatigued and generalized weakness.    Assessment & Plan   1. Hospital discharge follow-up Patient was discharged from the hospital on May 24.  She was treated for a drug eruption rash which was most likely caused by the new  medication Celebrex that was prescribed by myself on May 18 and is now listed as an allergy on the patient's chart.  She was also diagnosed and treated for community-acquired pneumonia and was discharged with additional antibiotic to take.  Patient states she is feeling much better and is trying to rest and take it easy.  2. Community acquired pneumonia of right lung, unspecified part of lung Patient diagnosed and treated for pneumonia recently.  Patient reports that she has  severe dust and mold allergies.  She is requesting to have the provider call the senior living center that she is a resident of an requesting that the clean the vents in her apartment as well as check her apartment for mold.  3. Stage 3a chronic kidney disease ([place]) Patient had acute renal failure with significant decrease in kidney function according to her labs while in the hospital but this has improved some prior to hospital discharge.  Repeat labs were ordered and the patient will most likely have her daughter take her to get her labs drawn next week - C-reactive protein  4. Essential (primary) hypertension Blood pressure remains stable and controlled with current medications  5. Hypokalemia Patient did have a slightly low potassium level of 3.1 when she was in the hospital, and this did resolve prior to discharge but will be reassessed with repeat labs  6. Elevated liver enzymes Repeat labs ordered to reevaluate liver enzymes which were significantly elevated while she was hospitalized and were normal prior to hospitalization. - CMP14+EGFR  7. Rash Patient developed a rash that is most likely from a allergic reaction to the new medication she was prescribed, Celebrex.  This medication has been discontinued and is listed as an allergy on her chart now.  8. Anemia due to stage 3a chronic kidney disease ([place]) The patient's labs while she was hospitalized did show some anemia, will repeat CBC with labs to reevaluate,  C-reactive protein was also elevated and this will be repeated.  She is also due for some routine labs that had not been checked in a year so her B12 and folate panel were also ordered. - CBC with Differential/Platelet - C-reactive protein - B12 and Folate Panel  9. Elevated TSH Previous TSH last year was elevated, repeat lab ordered - TSH + free T4  10. Vitamin D deficiency Due to have her vitamin D level repeated, routine lab ordered. - Vitamin D (25 hydroxy)  11. Mixed hyperlipidemia She is due to have her cholesterol levels rechecked, routine lab ordered. - Lipid Profile  12. Generalized anxiety disorder Patient has been on Lexapro for some time.  It was discontinued few months ago but patient has been experiencing increased anxiety and depressive symptoms and dealing with personal issues, declining vision due to macular degeneration, not being able to drive and, feelings of loneliness at times.  She had been restarted on her Lexapro at 5 mg which was then increased approximately a month ago to 10 mg.  The Lexapro dose was then increased to 20 mg due to increased anxiety and depressive symptoms at the patient's last office visit on May 18.  This medication was stopped by the hospitalist while she was admitted and she was told to stop taking it when she was discharged.  This medication did not cause a rash or any sort of allergic reaction.  This medication is safe for the patient to start taking again.  Patient was instructed to restart this medication and she stated that she had already started taking it again this morning prior to coming to her office visit today and this was an appropriate action which was verbalized to the patient by the provider today  13. Moderate episode of recurrent major depressive disorder ([place]) Please see problem #12    Reason for frequent admissions/ER visits    CAP Hypokalemia Elevated LFTs ARF on CKD stage 3a hypertension   Objective:   Vitals:    12/05/21 0913  BP: 130/60  Pulse:  62  Resp: 16  Temp: 97.6 F (36.4 C)  SpO2: 94%  Weight: 127 lb 6.4 oz (57.8 kg)  Height: $Remove'5\' 5"'nsIybPn$  (1.651 m)    Wt Readings from Last 3 Encounters:  12/05/21 127 lb 6.4 oz (57.8 kg)  12/03/21 127 lb 13.9 oz (58 kg)  11/27/21 124 lb (56.2 kg)    Allergies as of 12/05/2021   No Known Allergies      Medication List        Accurate as of Dec 05, 2021  9:49 AM. If you have any questions, ask your nurse or doctor.          acetaminophen 500 MG tablet Commonly known as: TYLENOL Take 500 mg by mouth every 6 (six) hours as needed.   bisoprolol-hydrochlorothiazide 5-6.25 MG tablet Commonly known as: ZIAC Take 1 tablet by mouth daily.   cefUROXime 250 MG tablet Commonly known as: CEFTIN Take 1 tablet (250 mg total) by mouth 2 (two) times daily with a meal.   Centrum Silver 50+Women Tabs Take by mouth.   cyclobenzaprine 10 MG tablet Commonly known as: FLEXERIL TAKE 1 TABLET BY MOUTH AT BEDTIME FOR BACK SPASMS AS NEEDED ONLY. What changed: See the new instructions.   estradiol 0.1 MG/GM vaginal cream Commonly known as: ESTRACE Use once a week as needed   fluticasone 50 MCG/ACT nasal spray Commonly known as: FLONASE 1 SPRAY IN EACH NOSTRIL ONCE DAILY AS NEEDED What changed:  how much to take how to take this when to take this   furosemide 20 MG tablet Commonly known as: LASIX Take 1 tablet (20 mg total) by mouth daily.   lubiprostone 8 MCG capsule Commonly known as: Amitiza TAKE 1 CAPSULE BY MOUTH ONCE DAILY FOR CONSTIPATION What changed:  how much to take how to take this when to take this   montelukast 10 MG tablet Commonly known as: SINGULAIR Take 1 tablet (10 mg total) by mouth at bedtime.   ondansetron 4 MG tablet Commonly known as: ZOFRAN Take 1 tablet (4 mg total) by mouth every 8 (eight) hours as needed for nausea or vomiting.   polyethylene glycol powder 17 GM/SCOOP powder Commonly known as:  GLYCOLAX/MIRALAX Take 17 g by mouth daily as needed for mild constipation or moderate constipation.   rOPINIRole 1 MG tablet Commonly known as: REQUIP Take 1 tablet (1 mg total) by mouth 3 (three) times daily as needed (restless legs). What changed: additional instructions   SALONPAS PAIN RELIEF PATCH EX Apply 1 patch topically 2 (two) times daily.   Systane 0.4-0.3 % Soln Generic drug: Polyethyl Glycol-Propyl Glycol Apply to eye.   traMADol 50 MG tablet Commonly known as: ULTRAM TAKE ONE TABLET TWICE A DAY IF NEEDED FOR SEVERE PAIN   traZODone 50 MG tablet Commonly known as: DESYREL Take 0.5-1 tablets (25-50 mg total) by mouth at bedtime.   Vitamin D3 125 MCG (5000 UT) Tabs Take 5,000 Units by mouth daily.         Physical Exam: Constitutional: Patient appears well-developed and well-nourished. Not in obvious distress. HENT: Normocephalic, atraumatic, External right and left ear normal. Oropharynx is clear and moist.  Eyes: Conjunctivae and EOM are normal. PERRLA, no scleral icterus. Neck: Normal ROM. Neck supple. No JVD. No tracheal deviation. No thyromegaly. CVS: RRR, S1/S2 +, no murmurs, no gallops, no carotid bruit.  Pulmonary: Effort and breath sounds normal, no stridor, rhonchi, wheezes, rales.  Abdominal: Soft. BS +, no distension, tenderness, rebound or guarding.  Musculoskeletal: Normal range  of motion. No edema and no tenderness.  Lymphadenopathy: No lymphadenopathy noted, cervical, inguinal or axillary Neuro: Alert. Normal reflexes, muscle tone coordination. No cranial nerve deficit. Skin: Skin is warm and dry. No rash noted. Not diaphoretic. No erythema. No pallor. Psychiatric: Normal mood and affect. Behavior, judgment, thought content normal.   Data Review   Micro Results Recent Results (from the past 240 hour(s))  Resp Panel by RT-PCR (Flu A&B, Covid) Nasopharyngeal Swab     Status: None   Collection Time: 11/30/21  2:22 PM   Specimen:  Nasopharyngeal Swab; Nasopharyngeal(NP) swabs in vial transport medium  Result Value Ref Range Status   SARS Coronavirus 2 by RT PCR NEGATIVE NEGATIVE Final    Comment: (NOTE) SARS-CoV-2 target nucleic acids are NOT DETECTED.  The SARS-CoV-2 RNA is generally detectable in upper respiratory specimens during the acute phase of infection. The lowest concentration of SARS-CoV-2 viral copies this assay can detect is 138 copies/mL. A negative result does not preclude SARS-Cov-2 infection and should not be used as the sole basis for treatment or other patient management decisions. A negative result may occur with  improper specimen collection/handling, submission of specimen other than nasopharyngeal swab, presence of viral mutation(s) within the areas targeted by this assay, and inadequate number of viral copies(<138 copies/mL). A negative result must be combined with clinical observations, patient history, and epidemiological information. The expected result is Negative.  Fact Sheet for Patients:  EntrepreneurPulse.com.au  Fact Sheet for Healthcare Providers:  IncredibleEmployment.be  This test is no t yet approved or cleared by the [place] FDA and  has been authorized for detection and/or diagnosis of SARS-CoV-2 by FDA under an Emergency Use Authorization (EUA). This EUA will remain  in effect (meaning this test can be used) for the duration of the COVID-19 declaration under Section 564(b)(1) of the Act, 21 U.S.C.section 360bbb-3(b)(1), unless the authorization is terminated  or revoked sooner.       Influenza A by PCR NEGATIVE NEGATIVE Final   Influenza B by PCR NEGATIVE NEGATIVE Final    Comment: (NOTE) The Xpert Xpress SARS-CoV-2/FLU/RSV plus assay is intended as an aid in the diagnosis of influenza from Nasopharyngeal swab specimens and should not be used as a sole basis for treatment. Nasal washings and aspirates are unacceptable for  Xpert Xpress SARS-CoV-2/FLU/RSV testing.  Fact Sheet for Patients: EntrepreneurPulse.com.au  Fact Sheet for Healthcare Providers: IncredibleEmployment.be  This test is not yet approved or cleared by the [place] FDA and has been authorized for detection and/or diagnosis of SARS-CoV-2 by FDA under an Emergency Use Authorization (EUA). This EUA will remain in effect (meaning this test can be used) for the duration of the COVID-19 declaration under Section 564(b)(1) of the Act, 21 U.S.C. section 360bbb-3(b)(1), unless the authorization is terminated or revoked.  Performed at [facility], [address] [place], [place] [address]   Culture, blood (routine x 2) Call MD if unable to obtain prior to antibiotics being given     Status: None (Preliminary result)   Collection Time: 12/01/21  5:53 AM   Specimen: BLOOD  Result Value Ref Range Status   Specimen Description BLOOD RIGHT HAND  Final   Special Requests   Final    BOTTLES DRAWN AEROBIC AND ANAEROBIC Blood Culture adequate volume   Culture   Final    NO GROWTH 4 DAYS Performed at [facility], [address], [place], [place] [address]    Report Status PENDING  Incomplete  Culture, blood (  routine x 2) Call MD if unable to obtain prior to antibiotics being given     Status: None (Preliminary result)   Collection Time: 12/01/21  5:53 AM   Specimen: BLOOD  Result Value Ref Range Status   Specimen Description BLOOD LEFT HAND  Final   Special Requests   Final    BOTTLES DRAWN AEROBIC AND ANAEROBIC Blood Culture adequate volume   Culture   Final    NO GROWTH 4 DAYS Performed at [facility], [address] [place], [place] [address]    Report Status PENDING  Incomplete     CBC Recent Labs  Lab 11/30/21 1205 12/01/21 0526  WBC 9.7 11.6*  HGB 12.8 11.1*  HCT 40.2 34.5*  PLT 245 230  MCV 95.5 96.4  MCH 30.4 31.0  MCHC 31.8 32.2  RDW 12.6 12.6     Chemistries  Recent Labs  Lab 11/30/21 1158 11/30/21 1205 11/30/21 2028 12/01/21 0526 12/01/21 1430 12/03/21 0426  NA  --  139  --  138 136 141  K  --  3.1*  --  4.2 5.0 4.2  CL  --  97*  --  97* 93* 102  CO2  --  32  --  33* 33* 31  GLUCOSE  --  95  --  113* 142* 96  BUN  --  39*  --  48* 43* 29*  CREATININE  --  1.37*  --  1.48* 1.35* 1.02*  CALCIUM  --  9.6  --  9.1 10.3 9.8  MG  --   --  2.1  --   --   --   AST 156*  --   --   --  174* 66*  ALT 90*  --   --   --  169* 125*  ALKPHOS 67  --   --   --  91 69  BILITOT 1.3*  --   --   --  0.6 0.6   ------------------------------------------------------------------------------------------------------------------ estimated creatinine clearance is 33 mL/min (A) (by C-G formula based on SCr of 1.02 mg/dL (H)). ------------------------------------------------------------------------------------------------------------------ No results for input(s): HGBA1C in the last 72 hours. ------------------------------------------------------------------------------------------------------------------ No results for input(s): CHOL, HDL, LDLCALC, TRIG, CHOLHDL, LDLDIRECT in the last 72 hours. ------------------------------------------------------------------------------------------------------------------ No results for input(s): TSH, T4TOTAL, T3FREE, THYROIDAB in the last 72 hours.  Invalid input(s): FREET3 ------------------------------------------------------------------------------------------------------------------ No results for input(s): VITAMINB12, FOLATE, FERRITIN, TIBC, IRON, RETICCTPCT in the last 72 hours.  Coagulation profile Recent Labs  Lab 11/30/21 1158  INR 1.1    No results for input(s): DDIMER in the last 72 hours.  Cardiac Enzymes No results for input(s): CKMB, TROPONINI, MYOGLOBIN in the last 168 hours.  Invalid input(s):  CK ------------------------------------------------------------------------------------------------------------------ Invalid input(s): POCBNP  Return in about 27 days (around 01/01/2022) for previously scheduled, F/U, Treena Cosman PCP.   Time Spent in minutes  45 Time spent with patient included reviewing progress notes, labs, imaging studies, and discussing plan for follow up.  [place] Controlled Substance Database was reviewed by me for overdose risk score (ORS)   This patient was seen by Jonetta Osgood, FNP-C in collaboration with Dr. Clayborn Bigness as a part of collaborative care agreement.    Jonetta Osgood MSN, FNP-C on 12/05/2021 at 9:49 AM   **Disclaimer: This note may have been dictated with voice recognition software. Similar sounding words can inadvertently be transcribed and this note may contain transcription errors which may not have been corrected upon publication of note.**

## 2021-12-06 LAB — CULTURE, BLOOD (ROUTINE X 2)
Culture: NO GROWTH
Culture: NO GROWTH
Special Requests: ADEQUATE
Special Requests: ADEQUATE

## 2021-12-16 ENCOUNTER — Other Ambulatory Visit: Payer: Self-pay | Admitting: Nurse Practitioner

## 2021-12-16 DIAGNOSIS — G4701 Insomnia due to medical condition: Secondary | ICD-10-CM

## 2021-12-16 DIAGNOSIS — F411 Generalized anxiety disorder: Secondary | ICD-10-CM

## 2021-12-23 ENCOUNTER — Other Ambulatory Visit: Payer: Self-pay | Admitting: *Deleted

## 2021-12-23 NOTE — Patient Outreach (Signed)
[facility]) Care Management Telephonic RN Care Manager Note   12/23/2021 Name:  Kristen Pennington MRN:  [id number] DOB:  1928-09-27  Summary: HTN goal met with no additional issues. Blood pressure readings stable and pt continue to monitoring when needed.  Pt having issues with daughter [name]) participating in some of her care needs but open to a referral for [facility] social worker for further assistance on level of care in the future.  Recommendations/Changes made from today's visit: Case will be closed however referral made to [facility] social worker for information on ALF for consideration in the future. No other needs and primary provider will be updated on pt's disposition with [facility] services at this time.   Subjective: Kristen Pennington is an 88 y.o. year old female who is a primary patient of Jonetta Osgood, NP. The care management team was consulted for assistance with care management and/or care coordination needs.    Telephonic RN Care Manager completed Telephone Visit today.  Objective:   Medications Reviewed Today     Reviewed by Jonetta Osgood, NP (Nurse Practitioner) on 12/05/21 at 1432  Med List Status: <None>   Medication Order Taking? Sig Documenting Provider Last Dose Status Informant  acetaminophen (TYLENOL) 500 MG tablet [id number] Yes Take 500 mg by mouth every 6 (six) hours as needed. [provider] Taking Active Self  bisoprolol-hydrochlorothiazide [facility]) 5-6.25 MG tablet [id number] Yes Take 1 tablet by mouth daily. Jonetta Osgood, NP Taking Active Self  cefUROXime (CEFTIN) 250 MG tablet [id number] Yes Take 1 tablet (250 mg total) by mouth 2 (two) times daily with a meal. Wouk, Ailene Rud, MD Taking Active   Cholecalciferol (VITAMIN D3) 5000 units TABS [id number] Yes Take 5,000 Units by mouth daily. [provider] Taking Active Self  cyclobenzaprine (FLEXERIL) 10 MG tablet [id number] Yes TAKE 1 TABLET BY MOUTH AT BEDTIME FOR BACK  SPASMS AS NEEDED ONLY.  Patient taking differently: Take 10 mg by mouth at bedtime as needed for muscle spasms.   Lavera Guise, MD Taking Active Self  estradiol (ESTRACE) 0.1 MG/GM vaginal cream [id number] Yes Use once a week as needed Jonetta Osgood, NP Taking Active Self  fluticasone (FLONASE) 50 MCG/ACT nasal spray [id number] Yes 1 SPRAY IN EACH NOSTRIL ONCE DAILY AS NEEDED  Patient taking differently: Place 1 spray into both nostrils daily. 1 SPRAY IN EACH NOSTRIL ONCE DAILY AS NEEDED   Abernathy, Alyssa, NP Taking Active Self  furosemide (LASIX) 20 MG tablet [id number] Yes Take 1 tablet (20 mg total) by mouth daily. Jonetta Osgood, NP Taking Active Self  Liniments [facility] PAIN RELIEF PATCH EX) [id number] Yes Apply 1 patch topically 2 (two) times daily. [provider] Taking Active Self  lubiprostone (AMITIZA) 8 MCG capsule [id number] Yes TAKE 1 CAPSULE BY MOUTH ONCE DAILY FOR CONSTIPATION  Patient taking differently: Take 8 mcg by mouth daily with breakfast. TAKE 1 CAPSULE BY MOUTH ONCE DAILY FOR CONSTIPATION   Abernathy, Alyssa, NP Taking Active Self  montelukast (SINGULAIR) 10 MG tablet [id number] Yes Take 1 tablet (10 mg total) by mouth at bedtime. Jonetta Osgood, NP Taking Active Self  Multiple Vitamins-Minerals (CENTRUM SILVER 50+WOMEN) TABS [id number] Yes Take by mouth. [provider] Taking Active Self  ondansetron (ZOFRAN) 4 MG tablet [id number] Yes Take 1 tablet (4 mg total) by mouth every 8 (eight) hours as needed for nausea or vomiting. Jonetta Osgood, NP Taking Active Self  Polyethyl Glycol-Propyl Glycol (SYSTANE) 0.4-0.3 % SOLN [id number] Yes Apply to eye. [provider] Taking Active Self  polyethylene glycol powder (GLYCOLAX/MIRALAX) 17 GM/SCOOP powder [id number] Yes Take 17 g by mouth daily as needed for mild constipation or moderate constipation. Jonetta Osgood, NP Taking Active Self  rOPINIRole (REQUIP) 1 MG tablet [id number] Yes Take 1  tablet (1 mg total) by mouth 3 (three) times daily as needed (restless legs).  Patient taking differently: Take 1 mg by mouth 3 (three) times daily as needed (restless legs). Pt takes 1 pill in am and 2 pills at bedtime   Jonetta Osgood, NP Taking Active Self  traMADol (ULTRAM) 50 MG tablet [id number] Yes TAKE ONE TABLET TWICE A DAY IF NEEDED FOR SEVERE PAIN Abernathy, Alyssa, NP Taking Active Self  traZODone (DESYREL) 50 MG tablet [id number] Yes Take 0.5-1 tablets (25-50 mg total) by mouth at bedtime. Jonetta Osgood, NP Taking Active Self             SDOH:  (Social Determinants of Health) assessments and interventions performed:     Care Plan  Review of patient past medical history, allergies, medications, health status, including review of consultants reports, laboratory and other test data, was performed as part of comprehensive evaluation for care management services.   Care Plan : RN Care Manager plan of care  Updates made by Tobi Bastos, RN since 12/23/2021 12:00 AM     Problem: Knowledge Deficit related to HTN and care coordination needs   Priority: High     Long-Range Goal: Development plan of care for management of HTN Completed 12/23/2021  Start Date: 10/30/2021  Expected End Date: 05/12/2022  This Visit's Progress: On track  Recent Progress: On track  Priority: High  Note:   Current Barriers:  Knowledge Deficits related to plan of care for management of HTN   RNCM Clinical Goal(s):  Patient will verbalize basic understanding of  HTN disease process and self health management plan as evidenced by Self reporting take all medications exactly as prescribed and will call provider for medication related questions as evidenced by chart review and self reporting  through collaboration with RN Care manager, provider, and care team.   Interventions: Inter-disciplinary care team collaboration (see longitudinal plan of care) Evaluation of current treatment plan  related to  self management and patient's adherence to plan as established by provider   Hypertension Interventions:  (Status:  New goal.) Long Term Goal Last practice recorded BP readings:  BP Readings from Last 3 Encounters:  10/29/21 140/75  09/29/21 140/70  09/08/21 140/80  Most recent eGFR/CrCl:  Lab Results  Component Value Date   EGFR 51 (L) 09/22/2021    No components found for: CRCL  Evaluation of current treatment plan related to hypertension self management and patient's adherence to plan as established by provider Provided education to patient re: stroke prevention, s/s of heart attack and stroke Reviewed medications with patient and discussed importance of compliance Counseled on adverse effects of illicit drug and excessive alcohol use in patients with high blood pressure  Provided assistance with obtaining home blood pressure monitor via [facility] provision; Discussed plans with patient for ongoing care management follow up and provided patient with direct contact information for care management team Discussed complications of poorly controlled blood pressure such as heart disease, stroke, circulatory complications, vision complications, kidney impairment, sexual dysfunction Screening for signs and symptoms of depression related to chronic disease state  Assessed social determinant of health barriers  11/28/2021 Update: Pt reports not feeling to well but she has consulted with her provider on yesterday  and provided a prescriptions to assist with her ongoing nausea.  RN attempted to gather additional information to intervene. Pt states she'll be okay and her daughter [name] is going to be tomorrow or Sunday. RN offered to call concerning how she was feeling today however pt decline and will awake her arrival this weekend. No acute issues just again not feeling well as pt cancelled family function with no visitor due to her illness but again improved slowly. Offered to follow up in a  few weeks to inquire and encouraged the pt to call the [facility] nurse hotline if needed for any issues over the weekend or after RN case manager's office hours ( pt receptive and indicated she would do if needed).  Reports her most recent blood pressure at 105/52 with no specific symptoms other then sore throat and some ongoing nausea. Reiterated on the plan of care and will continue to encouraged adherence with her ongoing monitoring. Review medications with no recent changes noted other then the nausea medication.  12/23/2021 Update: Pt reports her BP are good with no acute readings. Pt remains stable as review of all interventions and goals that have been met over the last several month. No other needs presented related to her blood pressures. Pt aware to continue monitoring as needed with any stressors or acute events and aware to reach out to her provider for assistance.  Pt continue to have issues with her daughter participating in her ongoing management of care such as assistance with getting groceries and around the home. [place] and is considering ALF. Pt receptive to referral for social worker for this information however does not wish for this to occur immediately "just considering".   Patient Goals/Self-Care Activities: Take all medications as prescribed Attend all scheduled provider appointments Call pharmacy for medication refills 3-7 days in advance of running out of medications Attend church or other social activities Perform all self care activities independently  Perform IADL's (shopping, preparing meals, housekeeping, managing finances) independently Call provider office for new concerns or questions  check blood pressure 3 times per week choose a place to take my blood pressure (home, clinic or office, retail store) write blood pressure results in a log or diary learn about high blood pressure keep a blood pressure log take blood pressure log to all doctor appointments call  doctor for signs and symptoms of high blood pressure keep all doctor appointments take medications for blood pressure exactly as prescribed report new symptoms to your doctor  Follow Up Plan:  Telephone follow up appointment with care management team member scheduled for:  June 2023 The patient has been provided with contact information for the care management team and has been advised to call with any health related questions or concerns.    ALL GOALS MET WITH NO ADDITIONAL NEEDS. CASE WILL BE CLOSED.     [name], RN Care Management Coordinator [place] Office [phone]

## 2021-12-23 NOTE — Patient Outreach (Signed)
Received a social work referral for [name] . I have assigned Kristen Christen, RN to call for follow up and determine if there are any Case Management needs.    Kristen Pennington, [place], [place] Management [phone]

## 2021-12-31 ENCOUNTER — Encounter: Payer: Self-pay | Admitting: *Deleted

## 2021-12-31 ENCOUNTER — Other Ambulatory Visit: Payer: Self-pay | Admitting: *Deleted

## 2022-01-01 ENCOUNTER — Ambulatory Visit (INDEPENDENT_AMBULATORY_CARE_PROVIDER_SITE_OTHER): Payer: Medicare HMO | Admitting: Nurse Practitioner

## 2022-01-01 ENCOUNTER — Encounter: Payer: Self-pay | Admitting: *Deleted

## 2022-01-01 ENCOUNTER — Encounter: Payer: Self-pay | Admitting: Nurse Practitioner

## 2022-01-01 VITALS — BP 140/80 | HR 65 | Temp 98.5°F | Resp 16 | Ht 65.0 in | Wt 127.0 lb

## 2022-01-01 DIAGNOSIS — G8929 Other chronic pain: Secondary | ICD-10-CM | POA: Diagnosis not present

## 2022-01-01 DIAGNOSIS — F331 Major depressive disorder, recurrent, moderate: Secondary | ICD-10-CM

## 2022-01-01 DIAGNOSIS — R21 Rash and other nonspecific skin eruption: Secondary | ICD-10-CM

## 2022-01-01 DIAGNOSIS — I10 Essential (primary) hypertension: Secondary | ICD-10-CM

## 2022-01-01 DIAGNOSIS — F411 Generalized anxiety disorder: Secondary | ICD-10-CM | POA: Diagnosis not present

## 2022-01-01 DIAGNOSIS — M5441 Lumbago with sciatica, right side: Secondary | ICD-10-CM

## 2022-01-01 NOTE — Progress Notes (Signed)
[facility] [place], [place] [address]  Internal MEDICINE  Office Visit Note  Patient Name: Kristen Pennington  [id number]  [id number]  Date of Service: 01/01/2022  Chief Complaint  Patient presents with   Follow-up    Review meds   Depression   Gastroesophageal Reflux   Hyperlipidemia   Hypertension    HPI Agam presents for a follow up visit to review her medications. She has been taking lexapro 20 mg daily and feels like this is helping some. She has been keeping busy at home and cleaning her apartment. She will be celebrating her 48st birthday next month and states she is just afraid or worried of being alone. She has not had her labs drawn yet because she is waiting until her daughter can visit and take her to the lab. She was given a refill of celebrex which she is allergic to so [facility] pharmacy will need to be called so that the medication can be discontinued. Patient was instructed not to take that medication and it was removed from her pill box.  -otherwise she has no concerns or complaints. She denies any new or worsening pain.       Current Medication: Outpatient Encounter Medications as of 01/01/2022  Medication Sig   acetaminophen (TYLENOL) 500 MG tablet Take 500 mg by mouth every 6 (six) hours as needed.   bisoprolol-hydrochlorothiazide (ZIAC) 5-6.25 MG tablet Take 1 tablet by mouth daily.   Cholecalciferol (VITAMIN D3) 5000 units TABS Take 5,000 Units by mouth daily.   cyclobenzaprine (FLEXERIL) 10 MG tablet TAKE 1 TABLET BY MOUTH AT BEDTIME FOR BACK SPASMS AS NEEDED ONLY. (Patient taking differently: Take 10 mg by mouth at bedtime as needed for muscle spasms.)   estradiol (ESTRACE) 0.1 MG/GM vaginal cream Use once a week as needed   fluticasone (FLONASE) 50 MCG/ACT nasal spray 1 SPRAY IN EACH NOSTRIL ONCE DAILY AS NEEDED (Patient taking differently: Place 1 spray into both nostrils daily. 1 SPRAY IN EACH NOSTRIL ONCE DAILY AS NEEDED)    furosemide (LASIX) 20 MG tablet Take 1 tablet (20 mg total) by mouth daily.   Liniments ([facility] PAIN RELIEF PATCH EX) Apply 1 patch topically 2 (two) times daily.   lubiprostone (AMITIZA) 8 MCG capsule TAKE 1 CAPSULE BY MOUTH ONCE DAILY FOR CONSTIPATION (Patient taking differently: Take 8 mcg by mouth daily with breakfast. TAKE 1 CAPSULE BY MOUTH ONCE DAILY FOR CONSTIPATION)   montelukast (SINGULAIR) 10 MG tablet Take 1 tablet (10 mg total) by mouth at bedtime.   Multiple Vitamins-Minerals (CENTRUM SILVER 50+WOMEN) TABS Take by mouth.   ondansetron (ZOFRAN) 4 MG tablet Take 1 tablet (4 mg total) by mouth every 8 (eight) hours as needed for nausea or vomiting.   Polyethyl Glycol-Propyl Glycol (SYSTANE) 0.4-0.3 % SOLN Apply to eye.   polyethylene glycol powder (GLYCOLAX/MIRALAX) 17 GM/SCOOP powder Take 17 g by mouth daily as needed for mild constipation or moderate constipation.   rOPINIRole (REQUIP) 1 MG tablet Take 1 tablet (1 mg total) by mouth 3 (three) times daily as needed (restless legs). (Patient taking differently: Take 1 mg by mouth 3 (three) times daily as needed (restless legs). Pt takes 1 pill in am and 2 pills at bedtime)   traMADol (ULTRAM) 50 MG tablet TAKE ONE TABLET TWICE A DAY IF NEEDED FOR SEVERE PAIN   traZODone (DESYREL) 50 MG tablet TAKE 1/2 TO 1 TABLET BY MOUTH AT BEDTIME   [DISCONTINUED] cefUROXime (CEFTIN) 250 MG tablet Take  1 tablet (250 mg total) by mouth 2 (two) times daily with a meal.   No facility-administered encounter medications on file as of 01/01/2022.    Surgical History: Past Surgical History:  Procedure Laterality Date   COLONOSCOPY  05-03-15   COLONOSCOPY WITH PROPOFOL N/A 05/03/2015   Procedure: COLONOSCOPY WITH PROPOFOL;  Surgeon: Lucilla Lame, MD;  Location: [place];  Service: Endoscopy;  Laterality: N/A;  CPAP   DILATION AND CURETTAGE OF UTERUS  1970   ENDOMETRIAL BIOPSY     EYE SURGERY Right 2017   POLYPECTOMY  05/03/2015   Procedure:  POLYPECTOMY;  Surgeon: Lucilla Lame, MD;  Location: [place];  Service: Endoscopy;;   ROTATOR CUFF REPAIR  2007   SPINE SURGERY      Medical History: Past Medical History:  Diagnosis Date   Arthritis    Atrophic vaginitis 03/06/2013   Last Assessment & Plan:  She will plan to continue estradiol vaginal cream.  Prescription was rewritten stipulating use of the generic product.    Atypical migraine 03/06/2013   Last Assessment & Plan:  Since she rarely takes sumatriptan, we will discontinue it. Continue sparing use of OTC migraine products.    Avitaminosis D 03/06/2013   Last Assessment & Plan:  Recheck vitamin D level. Plan to continue vitamin D supplementation.    Ceratitis 08/01/2013   Last Assessment & Plan:  Because of the expense, she will discontinue Restasis. She will use over-the-counter moisturizing eyedrops and liquid gel.    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    Cystocele, midline 03/06/2013   Demoralization and apathy 03/06/2013   Last Assessment & Plan:  Since she has been taking bupropion only once a day, I will write the prescription with the correct instructions and correct quantity. She has done well on this for years and she is encouraged to take it regularly    Depression    Environmental allergies    Epistaxis    Essential (primary) hypertension 05/17/2013   Last Assessment & Plan:  Her blood pressure is well-controlled. We will plan to continue hydrochlorothiazide and benazepril.  Both are generic and should be affordable on her health plan.    GERD (gastroesophageal reflux disease)    Hemorrhoid    Inflammation of sacroiliac joint ([place]) 03/06/2013   Last Assessment & Plan:  She is doing well on her present regimen of  fentanyl patch supplemented with sparing use of tramadol/acetaminophen. We will continue that regimen.    Sinusitis    Sleep apnea    CPAP    Family History: Family History  Problem Relation Age of Onset   Arthritis Mother    Alzheimer's disease Father    Heart disease Sister    Cancer Brother    Ovarian cancer Neg Hx    Breast cancer Neg Hx    Colon cancer Neg Hx    Diabetes Neg Hx     Social History   Socioeconomic History   Marital status: Widowed    Spouse name: Not on file   Number of children: 2   Years of education: 48   Highest education level: Some college, no degree  Occupational History   Not on file  Tobacco Use   Smoking  status: Never    Passive exposure: Never   Smokeless tobacco: Never  Vaping Use   Vaping Use: Never used  Substance and Sexual Activity   Alcohol use: No    Alcohol/week: 0.0 standard drinks of alcohol   Drug use: No   Sexual activity: Not Currently  Other Topics Concern   Not on file  Social History Narrative   Not on file   Social Determinants of Health   Financial Resource Strain: Low Risk  (12/31/2021)   Overall Financial Resource Strain (CARDIA)    Difficulty of Paying Living Expenses: Not hard at all  Food Insecurity: No Food Insecurity (12/31/2021)   Hunger Vital Sign    Worried About Running Out of Food in the Last Year: Never true    Ran Out of Food in the Last Year: Never true  Transportation Needs: No Transportation Needs (12/31/2021)   PRAPARE - Hydrologist (Medical): No    Lack of Transportation (Non-Medical): No  Physical Activity: Inactive (12/31/2021)   Exercise Vital Sign    Days of Exercise per Week: 0 days    Minutes of Exercise per Session: 0 min  Stress: Stress Concern Present (12/31/2021)   [place]    Feeling of Stress : To some extent  Social Connections: Moderately Isolated (12/31/2021)   Social Connection  and Isolation Panel [NHANES]    Frequency of Communication with Friends and Family: More than three times a week    Frequency of Social Gatherings with Friends and Family: More than three times a week    Attends Religious Services: More than 4 times per year    Active Member of [employer] or Organizations: No    Attends Archivist Meetings: Never    Marital Status: Widowed  Intimate Partner Violence: Not At Risk (12/31/2021)   Humiliation, Afraid, Rape, and Kick questionnaire    Fear of Current or Ex-Partner: No    Emotionally Abused: No    Physically Abused: No    Sexually Abused: No      Review of Systems  Constitutional:  Negative for chills, fatigue and unexpected weight change.  HENT:  Negative for congestion, rhinorrhea, sneezing and sore throat.   Eyes:  Negative for redness.  Respiratory: Negative.  Negative for cough, chest tightness, shortness of breath and wheezing.   Cardiovascular: Negative.  Negative for chest pain and palpitations.  Gastrointestinal:  Negative for abdominal pain, constipation, diarrhea, nausea and vomiting.  Genitourinary:  Negative for dysuria and frequency.  Musculoskeletal:  Positive for arthralgias. Negative for back pain, joint swelling and neck pain.  Skin:  Negative for rash.  Neurological: Negative.  Negative for tremors and numbness.  Hematological:  Negative for adenopathy. Does not bruise/bleed easily.  Psychiatric/Behavioral:  Positive for behavioral problems (Depression). Negative for self-injury, sleep disturbance and suicidal ideas. The patient is nervous/anxious.     Vital Signs: BP 140/80   Pulse 65   Temp 98.5 F (36.9 C)   Resp 16   Ht '5\' 5"'$  (1.651 m)   Wt 127 lb (57.6 kg)   SpO2 98%   BMI 21.13 kg/m    Physical Exam Vitals reviewed.  Constitutional:      General: She is not in acute distress.    Appearance: Normal appearance. She is normal weight. She is not ill-appearing.  HENT:     Head: Normocephalic and  atraumatic.  Eyes:     Pupils: Pupils are  equal, round, and reactive to light.  Cardiovascular:     Rate and Rhythm: Normal rate and regular rhythm.  Pulmonary:     Effort: Pulmonary effort is normal. No respiratory distress.  Neurological:     Mental Status: She is alert and oriented to person, place, and time.  Psychiatric:        Mood and Affect: Mood normal.        Behavior: Behavior normal.        Assessment/Plan: 1. Essential (primary) hypertension Blood pressure is stable with current medications  2. Rash Resolved but patient took a celebrex dose not realizing the pharmacy sent it to her even though she is allergic to it. Removed from pill box and labeled do not take on bottle. Will call [facility] to make sure this medication is discontinued.   3. Generalized anxiety disorder Continue lexapro 20 mg daily, feels like it is helping.   4. Moderate episode of recurrent major depressive disorder (HCC) Mood improved on current dose of lexapro, continue as prescribed. Follow up in 1 month.   5. Chronic bilateral low back pain with right-sided sciatica May continue tramadol and acetaminophen as needed. No prescription NSAIDs at this time due to allergy to celebrex and GI side effects with meloxicam.    General Counseling: milliana reddoch understanding of the findings of todays visit and agrees with plan of treatment. I have discussed any further diagnostic evaluation that may be needed or ordered today. We also reviewed her medications today. she has been encouraged to call the office with any questions or concerns that should arise related to todays visit.    No orders of the defined types were placed in this encounter.   No orders of the defined types were placed in this encounter.   Return in about 1 month (around 02/02/2022) for F/U, Alyce Inscore PCP.   Total time spent:30 Minutes Time spent includes review of chart, medications, test results, and follow up plan with the  patient.   [place] Controlled Substance Database was reviewed by me.  This patient was seen by Jonetta Osgood, FNP-C in collaboration with Dr. Clayborn Bigness as a part of collaborative care agreement.   Haniyah Maciolek R. Valetta Fuller, MSN, FNP-C Internal medicine

## 2022-01-01 NOTE — Patient Outreach (Signed)
Care Management Clinical Social Work Note  01/01/2022 Name: Kristen Pennington MRN: [id number] DOB: [birth year]  Kristen Pennington is a 88 y.o. year old female who is a primary care patient of Jonetta Osgood, NP.  The Care Management team was consulted for assistance with chronic disease management and coordination needs.  Engaged with patient by telephone for initial visit in response to provider referral for social work chronic care management and care coordination services  Consent to Services:  Ms. Kyer was given information about Care Management services today including:  Care Management services includes personalized support from designated clinical staff supervised by her physician, including individualized plan of care and coordination with other care providers 24/7 contact phone numbers for assistance for urgent and routine care needs. The patient may stop case management services at any time by phone call to the office staff.  Patient agreed to services and consent obtained.   Assessment: Review of patient past medical history, allergies, medications, and health status, including review of relevant consultants reports was performed today as part of a comprehensive evaluation and provision of chronic care management and care coordination services.  SDOH (Social Determinants of Health) assessments and interventions performed:  SDOH Interventions    Flowsheet Row Most Recent Value  SDOH Interventions   Food Insecurity Interventions Intervention Not Indicated  Financial Strain Interventions Intervention Not Indicated  Housing Interventions Intervention Not Indicated  Physical Activity Interventions Patient Refused  Stress Interventions Offered [employer] Resources, Provide Counseling, Patient Refused  Social Connections Interventions Patient Refused  Transportation Interventions Intervention Not Indicated        Advanced Directives Status: See Care Plan for  related entries.  Care Plan  Allergies  Allergen Reactions   Celebrex [Celecoxib] Rash    Outpatient Encounter Medications as of 12/31/2021  Medication Sig   acetaminophen (TYLENOL) 500 MG tablet Take 500 mg by mouth every 6 (six) hours as needed.   bisoprolol-hydrochlorothiazide (ZIAC) 5-6.25 MG tablet Take 1 tablet by mouth daily.   Cholecalciferol (VITAMIN D3) 5000 units TABS Take 5,000 Units by mouth daily.   cyclobenzaprine (FLEXERIL) 10 MG tablet TAKE 1 TABLET BY MOUTH AT BEDTIME FOR BACK SPASMS AS NEEDED ONLY. (Patient taking differently: Take 10 mg by mouth at bedtime as needed for muscle spasms.)   estradiol (ESTRACE) 0.1 MG/GM vaginal cream Use once a week as needed   fluticasone (FLONASE) 50 MCG/ACT nasal spray 1 SPRAY IN EACH NOSTRIL ONCE DAILY AS NEEDED (Patient taking differently: Place 1 spray into both nostrils daily. 1 SPRAY IN EACH NOSTRIL ONCE DAILY AS NEEDED)   furosemide (LASIX) 20 MG tablet Take 1 tablet (20 mg total) by mouth daily.   Liniments (SALONPAS PAIN RELIEF PATCH EX) Apply 1 patch topically 2 (two) times daily.   lubiprostone (AMITIZA) 8 MCG capsule TAKE 1 CAPSULE BY MOUTH ONCE DAILY FOR CONSTIPATION (Patient taking differently: Take 8 mcg by mouth daily with breakfast. TAKE 1 CAPSULE BY MOUTH ONCE DAILY FOR CONSTIPATION)   montelukast (SINGULAIR) 10 MG tablet Take 1 tablet (10 mg total) by mouth at bedtime.   Multiple Vitamins-Minerals (CENTRUM SILVER 50+WOMEN) TABS Take by mouth.   ondansetron (ZOFRAN) 4 MG tablet Take 1 tablet (4 mg total) by mouth every 8 (eight) hours as needed for nausea or vomiting.   Polyethyl Glycol-Propyl Glycol (SYSTANE) 0.4-0.3 % SOLN Apply to eye.   polyethylene glycol powder (GLYCOLAX/MIRALAX) 17 GM/SCOOP powder Take 17 g by mouth daily as needed for mild constipation or moderate constipation.   rOPINIRole (  REQUIP) 1 MG tablet Take 1 tablet (1 mg total) by mouth 3 (three) times daily as needed (restless legs). (Patient taking  differently: Take 1 mg by mouth 3 (three) times daily as needed (restless legs). Pt takes 1 pill in am and 2 pills at bedtime)   traMADol (ULTRAM) 50 MG tablet TAKE ONE TABLET TWICE A DAY IF NEEDED FOR SEVERE PAIN   traZODone (DESYREL) 50 MG tablet TAKE 1/2 TO 1 TABLET BY MOUTH AT BEDTIME   [DISCONTINUED] cefUROXime (CEFTIN) 250 MG tablet Take 1 tablet (250 mg total) by mouth 2 (two) times daily with a meal.   No facility-administered encounter medications on file as of 12/31/2021.    Patient Active Problem List   Diagnosis Date Noted   CAP (community acquired pneumonia) 11/30/2021   Rash 11/30/2021   Hypokalemia 11/30/2021   Abnormal LFTs 11/30/2021   Acute renal failure superimposed on stage 3a chronic kidney disease ([place]) 11/30/2021   Depression with anxiety 11/30/2021   Abnormal involuntary movements 06/11/2021   Elevated blood pressure reading 03/12/2021   Insomnia due to medical condition 03/11/2021   Muscle strain of left upper back 12/22/2020   Memory problem 08/29/2020   Chronic shoulder pain (Left) 06/03/2020   Arthralgia of shoulder region (Left) 06/03/2020   Osteoarthritis of shoulder (Left) 06/03/2020   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   Acute shoulder pain (Left) 05/30/2020   Chronic low back pain (Midline) w/o sciatica 05/15/2020   Spinal enthesopathy of lumbar region [facility]) 05/15/2020   Kissing spine syndrome 05/06/2020   Long term prescription opiate use 04/29/2020   Encounter for general adult medical examination with abnormal findings 03/20/2020   Hyperkalemia 03/20/2020   Hypercalcemia 03/20/2020   Abnormal kidney function 03/20/2020   Abnormal MRI, lumbar spine (01/08/2020) 02/14/2020   Lumbar facet hypertrophy 02/14/2020   MDD (major depressive disorder), recurrent, in full remission ([place]) 12/18/2019   Osteoarthritis of hips (Bilateral) 11/20/2019   Lumbar facet arthropathy 11/20/2019   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/2019   Primary  osteoarthritis involving multiple joints 11/20/2019   OSA on CPAP 10/26/2019   MDD (major depressive disorder), recurrent, in partial remission ([place]) 09/19/2019   Bereavement 09/19/2019   Chronic pain syndrome 08/21/2019   Pharmacologic therapy 08/21/2019   Disorder of skeletal system 08/21/2019   Problems influencing health status 08/21/2019   History of lumbar spinal fusion 08/21/2019   Chronic hip pain (2ry area of Pain) (Bilateral) (R>L) 08/21/2019   Lumbar facet syndrome (Bilateral) 08/21/2019   Dysuria 07/02/2019   Delusional disorder ([place]) 03/08/2019   MDD (major depressive disorder), recurrent episode, mild ([place]) 01/18/2019   Insomnia due to mental condition 01/18/2019   History of delusional disorder 01/18/2019   Gastroesophageal reflux disease without esophagitis 11/27/2018   Irritable bowel syndrome with constipation 11/27/2018   Chronic low back pain (1ry area of Pain) (Bilateral) (R>L) w/o sciatica 08/17/2018   Vitamin B12 deficiency 08/17/2018   Elevated TSH 08/17/2018   Urinary tract infection without hematuria 08/16/2017   Generalized anxiety disorder 08/16/2017   Primary generalized (osteo)arthritis 08/16/2017   Melena 12/05/2015   Blood in stool    Benign neoplasm of transverse colon    Benign neoplasm of cecum    Ceratitis 08/01/2013   Keratitis sicca ([place]) 08/01/2013   Essential (primary) hypertension 05/17/2013   Cystocele, midline 03/06/2013   Demoralization and apathy 03/06/2013   Atypical migraine 03/06/2013   Hyperlipidemia 03/06/2013   Jittery 03/06/2013   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  Film/video editor, Limited Social Support, Transportation, Limited Access to [employer], Level of Care Concerns, ADL/IADL Limitations, Mental Health Concerns  Family and Relationship Dysfunction, Social Isolation, Limited Access to Caregiver, Memory Deficits, and Lacks Knowledge of [employer].  Care Plan : LCSW Plan of Care  Updates made by Francis Gaines, LCSW since 01/01/2022 12:00 AM     Problem: Find Help in My Community.   Priority: High     Goal: Find Help in My Community.   Start Date: 12/31/2021  Expected End Date: 04/02/2022  This Visit's Progress: On track  Priority: High  Note:   Current Barriers:   Financial constraints related to only receiving Social Security Income. Lack of transportation to and from physician appointments, recently sold car and now unable to drive. Lack of food and nutritional services. Lack of caregiver support to assist with ADL's/IADL's independently and consistently. Lack of knowledge regarding available community agencies and resources. Clinical Goals:  Patient will work with LCSW, to address needs related to finances, transportation, food and nutritional services, and caregiver support.     Interventions: Collaboration with Primary Care Provider, Nurse Practitioner, Jonetta Osgood, regarding development and update of comprehensive plan of care, as evidenced by provider attestation and co-signature. Inter-disciplinary care team collaboration (see longitudinal plan of care). Patient interviewed, and appropriate assessments performed.  Discussed plans with patient for ongoing care management follow-up, and provided direct contact information for care management team. Assisted patient with obtaining information about health plan benefits through [facility]. Assessment of needs, barriers, agencies contacted, as well as how impacting.  Clinical Interventions:   Reviewed various financial resources, discussed options, and provided patient and daughter with information on [place], [employer], and [employer]. Reviewed various transportation resources, discussed options, and provided patient and daughter with information on Transportation Options in [place], [place], [place] ([employer] for [employer]) Programmer, systems, and [employer] Reviewed various food and nutritional resources, discussed options, and provided patient and daughter with information on how to apply for [place]ar[place] manager) Supplemental Nutrition Assistance Program, and [employer] Application.  Reviewed various caregiver support and in-home care resources, discussed options, and provided patient and daughter with information on [place], [place], [place] Service Providers, Adult Day Care Programs in [place], [place], Medical Alert Brochure, and [employer]. PHQ-2 and PHQ-9 Depression Screen completed, and results reviewed with patient.. Suicidal Ideation/Homicidal Ideation assessed - none present. Solution-Focused Strategies implemented. Active Listening/Reflection utilized. Emotional Support provided. Problem Solving/Task-Centered Solutions developed. Prescription medications reviewed with patient, and compliance discussed. Quality of Sleep Assessed, and Sleep Hygiene Techniques promoted. Increase in actives/exercise encouraged. Patient Goals/Self-Care Activities:  Patient and daughter will begin working with LCSW, on a bi-weekly basis, in an effort to obtain financial, transportation, food and nutritional services, and caregiver support, resources, and referrals.   Review list of resources mailed to your home, as well as e-mailed to your daughter  ([email or web address]), which include all of the following:    ~ [place] ~  [facility] Emergency Assistance Programs ~ [place] Financial Resources ~ Transportation Options in [place] ~ [place] ~ PART Personal assistant for [employer]) Brochure ~ Nutritional therapist ~ [place] (SNAP) Programmer, systems ~ [place] (SNAP) Application ~ [place] ~ [place] ~ [place] ~ [facility] in [place] ~ [place] ~ Academic librarian ~ [place] directly [phone]), if you have questions, need assistance, or if additional social work needs are identified between now and our next scheduled telephone outreach call. Follow-Up Date:  01/07/2022 at 10:45 am    Nat Christen, BSW, MSW, [place]  Licensed Clinical Social Worker  [place]  Mailing [place] [address], [place], [place] [address] Physical Address-[address], [place], [place] [address] Toll Free Main # [phone] Fax # [phone] Cell # [phone] [email or web address]

## 2022-01-02 ENCOUNTER — Telehealth: Payer: Self-pay

## 2022-01-02 NOTE — Telephone Encounter (Signed)
Sp[facility] with steve from [facility] and d/c celebrex  due to she is allergic to celebrex they already D/c yesterday

## 2022-01-07 ENCOUNTER — Other Ambulatory Visit: Payer: Self-pay | Admitting: *Deleted

## 2022-01-07 ENCOUNTER — Other Ambulatory Visit: Payer: Self-pay | Admitting: Nurse Practitioner

## 2022-01-07 DIAGNOSIS — R110 Nausea: Secondary | ICD-10-CM

## 2022-01-07 NOTE — Patient Outreach (Signed)
Care Management Clinical Social Work Note  01/07/2022 Name: Kristen Pennington MRN: [id number] DOB: 05-Dec-1928  Kristen Pennington is a 88 y.o. year old female who is a primary care patient of Kristen Osgood, NP.  The Care Management team was consulted for assistance with chronic disease management and coordination needs.  Engaged with patient by telephone for follow up visit in response to provider referral for social work chronic care management and care coordination services  Consent to Services:  Kristen Pennington was given information about Care Management services today including:  Care Management services includes personalized support from designated clinical staff supervised by her physician, including individualized plan of care and coordination with other care providers 24/7 contact phone numbers for assistance for urgent and routine care needs. The patient may stop case management services at any time by phone call to the office staff.  Patient agreed to services and consent obtained.   Assessment: Review of patient past medical history, allergies, medications, and health status, including review of relevant consultants reports was performed today as part of a comprehensive evaluation and provision of chronic care management and care coordination services.  SDOH (Social Determinants of Health) assessments and interventions performed:    Advanced Directives Status: Not addressed in this encounter.  Care Plan  Allergies  Allergen Reactions   Celebrex [Celecoxib] Rash    Outpatient Encounter Medications as of 01/07/2022  Medication Sig   acetaminophen (TYLENOL) 500 MG tablet Take 500 mg by mouth every 6 (six) hours as needed.   bisoprolol-hydrochlorothiazide (ZIAC) 5-6.25 MG tablet Take 1 tablet by mouth daily.   Cholecalciferol (VITAMIN D3) 5000 units TABS Take 5,000 Units by mouth daily.   cyclobenzaprine (FLEXERIL) 10 MG tablet TAKE 1 TABLET BY MOUTH AT BEDTIME FOR BACK  SPASMS AS NEEDED ONLY. (Patient taking differently: Take 10 mg by mouth at bedtime as needed for muscle spasms.)   estradiol (ESTRACE) 0.1 MG/GM vaginal cream Use once a week as needed   fluticasone (FLONASE) 50 MCG/ACT nasal spray 1 SPRAY IN EACH NOSTRIL ONCE DAILY AS NEEDED (Patient taking differently: Place 1 spray into both nostrils daily. 1 SPRAY IN EACH NOSTRIL ONCE DAILY AS NEEDED)   furosemide (LASIX) 20 MG tablet Take 1 tablet (20 mg total) by mouth daily.   Liniments (SALONPAS PAIN RELIEF PATCH EX) Apply 1 patch topically 2 (two) times daily.   lubiprostone (AMITIZA) 8 MCG capsule TAKE 1 CAPSULE BY MOUTH ONCE DAILY FOR CONSTIPATION (Patient taking differently: Take 8 mcg by mouth daily with breakfast. TAKE 1 CAPSULE BY MOUTH ONCE DAILY FOR CONSTIPATION)   montelukast (SINGULAIR) 10 MG tablet Take 1 tablet (10 mg total) by mouth at bedtime.   Multiple Vitamins-Minerals (CENTRUM SILVER 50+WOMEN) TABS Take by mouth.   ondansetron (ZOFRAN) 4 MG tablet Take 1 tablet (4 mg total) by mouth every 8 (eight) hours as needed for nausea or vomiting.   Polyethyl Glycol-Propyl Glycol (SYSTANE) 0.4-0.3 % SOLN Apply to eye.   polyethylene glycol powder (GLYCOLAX/MIRALAX) 17 GM/SCOOP powder Take 17 g by mouth daily as needed for mild constipation or moderate constipation.   rOPINIRole (REQUIP) 1 MG tablet Take 1 tablet (1 mg total) by mouth 3 (three) times daily as needed (restless legs). (Patient taking differently: Take 1 mg by mouth 3 (three) times daily as needed (restless legs). Pt takes 1 pill in am and 2 pills at bedtime)   traMADol (ULTRAM) 50 MG tablet TAKE ONE TABLET TWICE A DAY IF NEEDED FOR SEVERE PAIN   traZODone (  DESYREL) 50 MG tablet TAKE 1/2 TO 1 TABLET BY MOUTH AT BEDTIME   No facility-administered encounter medications on file as of 01/07/2022.    Patient Active Problem List   Diagnosis Date Noted   CAP (community acquired pneumonia) 11/30/2021   Rash 11/30/2021   Hypokalemia  11/30/2021   Abnormal LFTs 11/30/2021   Acute renal failure superimposed on stage 3a chronic kidney disease ([place]) 11/30/2021   Depression with anxiety 11/30/2021   Abnormal involuntary movements 06/11/2021   Elevated blood pressure reading 03/12/2021   Insomnia due to medical condition 03/11/2021   Muscle strain of left upper back 12/22/2020   Memory problem 08/29/2020   Chronic shoulder pain (Left) 06/03/2020   Arthralgia of shoulder region (Left) 06/03/2020   Osteoarthritis of shoulder (Left) 06/03/2020   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   Acute shoulder pain (Left) 05/30/2020   Chronic low back pain (Midline) w/o sciatica 05/15/2020   Spinal enthesopathy of lumbar region [facility]) 05/15/2020   Kissing spine syndrome 05/06/2020   Long term prescription opiate use 04/29/2020   Encounter for general adult medical examination with abnormal findings 03/20/2020   Hyperkalemia 03/20/2020   Hypercalcemia 03/20/2020   Abnormal kidney function 03/20/2020   Abnormal MRI, lumbar spine (01/08/2020) 02/14/2020   Lumbar facet hypertrophy 02/14/2020   MDD (major depressive disorder), recurrent, in full remission ([place]) 12/18/2019   Osteoarthritis of hips (Bilateral) 11/20/2019   Lumbar facet arthropathy 11/20/2019   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/2019   Primary osteoarthritis involving multiple joints 11/20/2019   OSA on CPAP 10/26/2019   MDD (major depressive disorder), recurrent, in partial remission ([place]) 09/19/2019   Bereavement 09/19/2019   Chronic pain syndrome 08/21/2019   Pharmacologic therapy 08/21/2019   Disorder of skeletal system 08/21/2019   Problems influencing health status 08/21/2019   History of lumbar spinal fusion 08/21/2019   Chronic hip pain (2ry area of Pain) (Bilateral) (R>L) 08/21/2019   Lumbar facet syndrome (Bilateral) 08/21/2019   Dysuria 07/02/2019   Delusional disorder ([place]) 03/08/2019   MDD (major depressive disorder), recurrent episode, mild  ([place]) 01/18/2019   Insomnia due to mental condition 01/18/2019   History of delusional disorder 01/18/2019   Gastroesophageal reflux disease without esophagitis 11/27/2018   Irritable bowel syndrome with constipation 11/27/2018   Chronic low back pain (1ry area of Pain) (Bilateral) (R>L) w/o sciatica 08/17/2018   Vitamin B12 deficiency 08/17/2018   Elevated TSH 08/17/2018   Urinary tract infection without hematuria 08/16/2017   Generalized anxiety disorder 08/16/2017   Primary generalized (osteo)arthritis 08/16/2017   Melena 12/05/2015   Blood in stool    Benign neoplasm of transverse colon    Benign neoplasm of cecum    Ceratitis 08/01/2013   Keratitis sicca ([place]) 08/01/2013   Essential (primary) hypertension 05/17/2013   Cystocele, midline 03/06/2013   Demoralization and apathy 03/06/2013   Atypical migraine 03/06/2013   Hyperlipidemia 03/06/2013   Jittery 03/06/2013   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  Limited Social Support, Level of Care Concerns, Mental Health Concerns, Social Isolation, Limited Access to Caregiver, Memory Deficits, and Lacks Knowledge of [employer].  Care Plan : LCSW Plan of Care  Updates made by Francis Gaines, LCSW since 01/07/2022 12:00 AM     Problem: Find Help in My Community.   Priority: High     Goal: Find Help in My Community.   Start Date: 12/31/2021  Expected End Date: 04/02/2022  This Visit's Progress: On track  Recent Progress: On track  Priority: High  Note:    Current Barriers:   Financial constraints related to only receiving Social Security Income. Lack of transportation to and from physician appointments, recently sold car and now unable to drive. Lack of food and nutritional services. Lack of caregiver support to assist with ADL's/IADL's independently and consistently. Lack of knowledge regarding available community agencies and resources. Clinical Goals:  Patient will work with LCSW, to address needs related to finances, transportation, food and nutritional services, and caregiver support.     Interventions: Collaboration with Primary Care Provider, Nurse Practitioner, Kristen Pennington, regarding development and update of comprehensive plan of care, as evidenced by provider attestation and co-signature. Inter-disciplinary care team collaboration (see longitudinal plan of care). Patient interviewed, and appropriate assessments performed.  Discussed plans with patient for ongoing care management follow-up, and provided direct contact information for care management team. Assisted patient with obtaining information about health plan benefits through [facility]. Assessment of needs, barriers, agencies contacted, as well as how impacting.  Clinical Interventions:  Reviewed various financial resources, discussed options, and provided patient and daughter with information on [place], [place] [employer], and [employer]. Reviewed various transportation resources, discussed options, and provided patient and daughter with information on Transportation Options in [place], [place], [place] ([employer] for [employer]) Programmer, systems, and [employer] Reviewed various food and nutritional resources, discussed options, and provided patient and daughter with information on how to apply for [place] manager)  Supplemental Nutrition Assistance Program, and [employer] Application.  Reviewed various caregiver support and in-home care resources, discussed options, and provided patient and daughter with information on [place], [place], [place] Service Providers, Adult Day Care Programs in [place], [place], [employer], and [employer]. Solution-Focused Strategies implemented. Active Listening/Reflection utilized. Emotional Support provided. Problem Solving/Task-Centered Solutions developed. Increase in actives/exercise encouraged. Patient Goals/Self-Care Activities:  Patient and daughter will continue working with LCSW, on a bi-weekly basis, in an effort to obtain financial, transportation, food and nutritional services, and caregiver support, resources, and referrals.   Review list of resources mailed to your home, as well as e-mailed to your daughter ([email or web address]) on 01/01/2022, and then again on 01/07/2022, which include all of the following:    ~ [place] ~ [facility] Emergency Assistance Programs ~ [place] ~ Transportation Options in [place] ~ [place] ~ PART (Systems developer for [employer]) [place] ~ Nutritional therapist ~ Tax adviser (SNAP) Programmer, systems ~ [place] (SNAP) Application ~ [place] ~ [place] ~ [place] ~ [place] in [place] ~ [place] ~ Academic librarian ~ [place] directly [phone]), if you have questions, need assistance, or if additional social work needs are identified between now and our next scheduled telephone outreach  call. Follow-Up Date:  01/15/2022 at 10:00 am   [place], [place],  MSW, LCSW  Licensed Clinical Social Worker  [place]  Mailing [place] [address] [place], [place] [address] Physical Address[address] [place], [place] [address] Toll Free Main # [phone] Fax # [phone] Cell # [phone] [email or web address]

## 2022-01-08 NOTE — Telephone Encounter (Signed)
Patient does not need zofran and promethazine. Also should not have automatic refills of medications like promethazine. I refused the refill for now. I will ask the patient next time she is here if she really needs the promethazine.

## 2022-01-14 ENCOUNTER — Other Ambulatory Visit: Payer: Self-pay | Admitting: Nurse Practitioner

## 2022-01-14 DIAGNOSIS — R110 Nausea: Secondary | ICD-10-CM

## 2022-01-14 DIAGNOSIS — I10 Essential (primary) hypertension: Secondary | ICD-10-CM

## 2022-01-14 DIAGNOSIS — M545 Low back pain, unspecified: Secondary | ICD-10-CM

## 2022-01-14 DIAGNOSIS — G8929 Other chronic pain: Secondary | ICD-10-CM

## 2022-01-14 DIAGNOSIS — K5900 Constipation, unspecified: Secondary | ICD-10-CM

## 2022-01-15 ENCOUNTER — Other Ambulatory Visit: Payer: Self-pay | Admitting: *Deleted

## 2022-01-15 ENCOUNTER — Encounter: Payer: Self-pay | Admitting: *Deleted

## 2022-01-15 NOTE — Patient Outreach (Signed)
Care Management Clinical Social Work Note  01/15/2022 Name: Kristen Pennington MRN: [id number] DOB: 03/28/1929  Kristen Pennington is a 88 y.o. year old female who is a primary care patient of Kristen Osgood, NP.  The Care Management team was consulted for assistance with chronic disease management and coordination needs.  Engaged with patient by telephone for follow up visit in response to provider referral for social work chronic care management and care coordination services  Consent to Services:  [name] was given information about Care Management services today including:  Care Management services includes personalized support from designated clinical staff supervised by her physician, including individualized plan of care and coordination with other care providers 24/7 contact phone numbers for assistance for urgent and routine care needs. The patient may stop case management services at any time by phone call to the office staff.  Patient agreed to services and consent obtained.   Assessment: Review of patient past medical history, allergies, medications, and health status, including review of relevant consultants reports was performed today as part of a comprehensive evaluation and provision of chronic care management and care coordination services.  SDOH (Social Determinants of Health) assessments and interventions performed:    Advanced Directives Status: Not addressed in this encounter.  Care Plan  Allergies  Allergen Reactions   Celebrex [Celecoxib] Rash    Outpatient Encounter Medications as of 01/15/2022  Medication Sig   acetaminophen (TYLENOL) 500 MG tablet Take 500 mg by mouth every 6 (six) hours as needed.   bisoprolol-hydrochlorothiazide (ZIAC) 5-6.25 MG tablet Take 1 tablet by mouth daily.   Cholecalciferol (VITAMIN D3) 5000 units TABS Take 5,000 Units by mouth daily.   cyclobenzaprine (FLEXERIL) 10 MG tablet TAKE 1 TABLET BY MOUTH AT BEDTIME FOR BACK SPASMS  AS NEEDED ONLY. (Patient taking differently: Take 10 mg by mouth at bedtime as needed for muscle spasms.)   estradiol (ESTRACE) 0.1 MG/GM vaginal cream Use once a week as needed   fluticasone (FLONASE) 50 MCG/ACT nasal spray 1 SPRAY IN EACH NOSTRIL ONCE DAILY AS NEEDED (Patient taking differently: Place 1 spray into both nostrils daily. 1 SPRAY IN EACH NOSTRIL ONCE DAILY AS NEEDED)   furosemide (LASIX) 20 MG tablet Take 1 tablet (20 mg total) by mouth daily.   Liniments (SALONPAS PAIN RELIEF PATCH EX) Apply 1 patch topically 2 (two) times daily.   lubiprostone (AMITIZA) 8 MCG capsule TAKE 1 CAPSULE BY MOUTH ONCE DAILY FOR CONSTIPATION (Patient taking differently: Take 8 mcg by mouth daily with breakfast. TAKE 1 CAPSULE BY MOUTH ONCE DAILY FOR CONSTIPATION)   montelukast (SINGULAIR) 10 MG tablet Take 1 tablet (10 mg total) by mouth at bedtime.   Multiple Vitamins-Minerals (CENTRUM SILVER 50+WOMEN) TABS Take by mouth.   ondansetron (ZOFRAN) 4 MG tablet Take 1 tablet (4 mg total) by mouth every 8 (eight) hours as needed for nausea or vomiting.   Polyethyl Glycol-Propyl Glycol (SYSTANE) 0.4-0.3 % SOLN Apply to eye.   polyethylene glycol powder (GLYCOLAX/MIRALAX) 17 GM/SCOOP powder Take 17 g by mouth daily as needed for mild constipation or moderate constipation.   rOPINIRole (REQUIP) 1 MG tablet Take 1 tablet (1 mg total) by mouth 3 (three) times daily as needed (restless legs). (Patient taking differently: Take 1 mg by mouth 3 (three) times daily as needed (restless legs). Pt takes 1 pill in am and 2 pills at bedtime)   traMADol (ULTRAM) 50 MG tablet TAKE ONE TABLET TWICE A DAY IF NEEDED FOR SEVERE PAIN   traZODone (  DESYREL) 50 MG tablet TAKE 1/2 TO 1 TABLET BY MOUTH AT BEDTIME   No facility-administered encounter medications on file as of 01/15/2022.    Patient Active Problem List   Diagnosis Date Noted   CAP (community acquired pneumonia) 11/30/2021   Rash 11/30/2021   Hypokalemia 11/30/2021    Abnormal LFTs 11/30/2021   Acute renal failure superimposed on stage 3a chronic kidney disease ([place]) 11/30/2021   Depression with anxiety 11/30/2021   Abnormal involuntary movements 06/11/2021   Elevated blood pressure reading 03/12/2021   Insomnia due to medical condition 03/11/2021   Muscle strain of left upper back 12/22/2020   Memory problem 08/29/2020   Chronic shoulder pain (Left) 06/03/2020   Arthralgia of shoulder region (Left) 06/03/2020   Osteoarthritis of shoulder (Left) 06/03/2020   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   Acute shoulder pain (Left) 05/30/2020   Chronic low back pain (Midline) w/o sciatica 05/15/2020   Spinal enthesopathy of lumbar region [facility]) 05/15/2020   Kissing spine syndrome 05/06/2020   Long term prescription opiate use 04/29/2020   Encounter for general adult medical examination with abnormal findings 03/20/2020   Hyperkalemia 03/20/2020   Hypercalcemia 03/20/2020   Abnormal kidney function 03/20/2020   Abnormal MRI, lumbar spine (01/08/2020) 02/14/2020   Lumbar facet hypertrophy 02/14/2020   MDD (major depressive disorder), recurrent, in full remission ([place]) 12/18/2019   Osteoarthritis of hips (Bilateral) 11/20/2019   Lumbar facet arthropathy 11/20/2019   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/2019   Primary osteoarthritis involving multiple joints 11/20/2019   OSA on CPAP 10/26/2019   MDD (major depressive disorder), recurrent, in partial remission ([place]) 09/19/2019   Bereavement 09/19/2019   Chronic pain syndrome 08/21/2019   Pharmacologic therapy 08/21/2019   Disorder of skeletal system 08/21/2019   Problems influencing health status 08/21/2019   History of lumbar spinal fusion 08/21/2019   Chronic hip pain (2ry area of Pain) (Bilateral) (R>L) 08/21/2019   Lumbar facet syndrome (Bilateral) 08/21/2019   Dysuria 07/02/2019   Delusional disorder ([place]) 03/08/2019   MDD (major depressive disorder), recurrent episode, mild ([place]) 01/18/2019    Insomnia due to mental condition 01/18/2019   History of delusional disorder 01/18/2019   Gastroesophageal reflux disease without esophagitis 11/27/2018   Irritable bowel syndrome with constipation 11/27/2018   Chronic low back pain (1ry area of Pain) (Bilateral) (R>L) w/o sciatica 08/17/2018   Vitamin B12 deficiency 08/17/2018   Elevated TSH 08/17/2018   Urinary tract infection without hematuria 08/16/2017   Generalized anxiety disorder 08/16/2017   Primary generalized (osteo)arthritis 08/16/2017   Melena 12/05/2015   Blood in stool    Benign neoplasm of transverse colon    Benign neoplasm of cecum    Ceratitis 08/01/2013   Keratitis sicca ([place]) 08/01/2013   Essential (primary) hypertension 05/17/2013   Cystocele, midline 03/06/2013   Demoralization and apathy 03/06/2013   Atypical migraine 03/06/2013   Hyperlipidemia 03/06/2013   Jittery 03/06/2013   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  Limited Social Support, Level of Care Concerns, ADL/IADL Limitations, Social Isolation, Limited Access to Caregiver, and Lacks Knowledge of [employer].  Care Plan : LCSW Plan of  Care  Updates made by Francis Gaines, LCSW since 01/15/2022 12:00 AM     Problem: Find Help in My Community. Resolved 01/15/2022  Priority: High     Goal: Find Help in My Community. Completed 01/15/2022  Start Date: 12/31/2021  Expected End Date: 01/15/2022  This Visit's Progress: On track  Recent Progress: On track  Priority: High  Note:   Current Barriers:   Financial constraints  related to only receiving Social Security Income. Lack of transportation to and from physician appointments, recently sold car and now unable to drive. Lack of food and nutritional services. Lack of caregiver support to assist with ADL's/IADL's independently and consistently. Lack of knowledge regarding available community agencies and resources. Clinical Goals:  Patient will work with LCSW, to address needs related to finances, transportation, food and nutritional services, and caregiver support.     Interventions: Collaboration with Primary Care Provider, Nurse Practitioner, Kristen Pennington, regarding development and update of comprehensive plan of care, as evidenced by provider attestation and co-signature. Inter-disciplinary care team collaboration (see longitudinal plan of care). Clinical Interventions:  Solution-Focused Strategies implemented. Active Listening/Reflection utilized. Emotional Support provided. Problem Solving/Task-Centered Solutions developed. Increase in actives/exercise encouraged. Patient Goals/Self-Care Activities:  Review list of resources with your daughter, which include all of the following, notifying LCSW directly if you have questions or need assistance with the referral process:    ~ [place] ~ [facility] Emergency Assistance Programs ~ [place] ~ Transportation Options in [place] ~ [place] ~ PART (Systems developer for [employer]) Programmer, systems ~ Nutritional therapist ~ Tax adviser (SNAP) Programmer, systems ~ [place] (SNAP) Application ~ [place] ~ [place] ~ [place] ~ [place] in [place] ~ [place] ~ Academic librarian ~ [place]  directly [phone]), if you have questions, need assistance, or if additional social work needs are identified in the near future. No Follow-Up Required.   Nat Christen, BSW, MSW, LCSW  Licensed Education officer, [facility]  Mailing [place] [address] [place], [place] [address] Physical Address-[address], [place], [place] [address] Toll Free Main # [phone] Fax # [phone] Cell # [phone] [email or web address]

## 2022-01-16 DIAGNOSIS — E782 Mixed hyperlipidemia: Secondary | ICD-10-CM | POA: Diagnosis not present

## 2022-01-16 DIAGNOSIS — N1831 Chronic kidney disease, stage 3a: Secondary | ICD-10-CM | POA: Diagnosis not present

## 2022-01-16 DIAGNOSIS — R7989 Other specified abnormal findings of blood chemistry: Secondary | ICD-10-CM | POA: Diagnosis not present

## 2022-01-16 DIAGNOSIS — R748 Abnormal levels of other serum enzymes: Secondary | ICD-10-CM | POA: Diagnosis not present

## 2022-01-16 DIAGNOSIS — D631 Anemia in chronic kidney disease: Secondary | ICD-10-CM | POA: Diagnosis not present

## 2022-01-17 LAB — CMP14+EGFR
ALT: 17 IU/L (ref 0–32)
AST: 26 IU/L (ref 0–40)
Albumin/Globulin Ratio: 1.8 (ref 1.2–2.2)
Albumin: 4.4 g/dL (ref 3.5–4.6)
Alkaline Phosphatase: 61 IU/L (ref 44–121)
BUN/Creatinine Ratio: 19 (ref 12–28)
BUN: 19 mg/dL (ref 10–36)
Bilirubin Total: 0.7 mg/dL (ref 0.0–1.2)
CO2: 26 mmol/L (ref 20–29)
Calcium: 10 mg/dL (ref 8.7–10.3)
Chloride: 100 mmol/L (ref 96–106)
Creatinine, Ser: 0.99 mg/dL (ref 0.57–1.00)
Globulin, Total: 2.4 g/dL (ref 1.5–4.5)
Glucose: 112 mg/dL — ABNORMAL HIGH (ref 70–99)
Potassium: 4.4 mmol/L (ref 3.5–5.2)
Sodium: 140 mmol/L (ref 134–144)
Total Protein: 6.8 g/dL (ref 6.0–8.5)
eGFR: 54 mL/min/{1.73_m2} — ABNORMAL LOW (ref >59)

## 2022-01-20 ENCOUNTER — Other Ambulatory Visit: Payer: Self-pay | Admitting: Nurse Practitioner

## 2022-01-22 LAB — CBC WITH DIFFERENTIAL/PLATELET
Basophils Absolute: 0.1 10*3/uL (ref 0.0–0.2)
Basos: 2 %
EOS (ABSOLUTE): 0.2 10*3/uL (ref 0.0–0.4)
Eos: 3 %
Hematocrit: 42.7 % (ref 34.0–46.6)
Hemoglobin: 14.1 g/dL (ref 11.1–15.9)
Immature Grans (Abs): 0 10*3/uL (ref 0.0–0.1)
Immature Granulocytes: 0 %
Lymphocytes Absolute: 2.2 10*3/uL (ref 0.7–3.1)
Lymphs: 38 %
MCH: 31.1 pg (ref 26.6–33.0)
MCHC: 33 g/dL (ref 31.5–35.7)
MCV: 94 fL (ref 79–97)
Monocytes Absolute: 0.5 10*3/uL (ref 0.1–0.9)
Monocytes: 9 %
Neutrophils Absolute: 2.8 10*3/uL (ref 1.4–7.0)
Neutrophils: 48 %
Platelets: 221 10*3/uL (ref 150–450)
RBC: 4.53 x10E6/uL (ref 3.77–5.28)
RDW: 12.5 % (ref 11.7–15.4)
WBC: 5.7 10*3/uL (ref 3.4–10.8)

## 2022-01-22 LAB — TSH+FREE T4
Free T4: 0.89 ng/dL (ref 0.82–1.77)
TSH: 3.64 u[IU]/mL (ref 0.450–4.500)

## 2022-01-22 LAB — VITAMIN D 25 HYDROXY (VIT D DEFICIENCY, FRACTURES): Vit D, 25-Hydroxy: 91.2 ng/mL (ref 30.0–100.0)

## 2022-01-22 LAB — LIPID PANEL
Chol/HDL Ratio: 3.8 ratio (ref 0.0–4.4)
Cholesterol, Total: 227 mg/dL — ABNORMAL HIGH (ref 100–199)
HDL: 60 mg/dL (ref >39)
LDL Chol Calc (NIH): 146 mg/dL — ABNORMAL HIGH (ref 0–99)
Triglycerides: 119 mg/dL (ref 0–149)
VLDL Cholesterol Cal: 21 mg/dL (ref 5–40)

## 2022-01-22 LAB — B12 AND FOLATE PANEL
Folate: >20 ng/mL (ref >3.0)
Vitamin B-12: 679 pg/mL (ref 232–1245)

## 2022-01-22 LAB — C-REACTIVE PROTEIN: CRP: <1 mg/L (ref 0–10)

## 2022-02-04 ENCOUNTER — Encounter: Payer: Self-pay | Admitting: Nurse Practitioner

## 2022-02-04 ENCOUNTER — Ambulatory Visit (INDEPENDENT_AMBULATORY_CARE_PROVIDER_SITE_OTHER): Payer: Medicare HMO | Admitting: Nurse Practitioner

## 2022-02-04 VITALS — BP 138/60 | HR 65 | Temp 98.5°F | Resp 16 | Ht 65.0 in | Wt 125.0 lb

## 2022-02-04 DIAGNOSIS — F411 Generalized anxiety disorder: Secondary | ICD-10-CM | POA: Diagnosis not present

## 2022-02-04 DIAGNOSIS — G8929 Other chronic pain: Secondary | ICD-10-CM

## 2022-02-04 DIAGNOSIS — F331 Major depressive disorder, recurrent, moderate: Secondary | ICD-10-CM | POA: Diagnosis not present

## 2022-02-04 DIAGNOSIS — I10 Essential (primary) hypertension: Secondary | ICD-10-CM | POA: Diagnosis not present

## 2022-02-04 DIAGNOSIS — N1831 Chronic kidney disease, stage 3a: Secondary | ICD-10-CM | POA: Diagnosis not present

## 2022-02-04 DIAGNOSIS — M545 Low back pain, unspecified: Secondary | ICD-10-CM | POA: Diagnosis not present

## 2022-02-04 MED ORDER — TRAMADOL HCL 50 MG PO TABS: 50.0000 mg | ORAL_TABLET | Freq: Four times a day (QID) | ORAL | 1 refills | Status: DC | PRN

## 2022-02-04 NOTE — Progress Notes (Incomplete)
[facility] [place], [place] [address]  Internal MEDICINE  Office Visit Note  Patient Name: Kristen Pennington  [id number]  [id number]  Date of Service: 02/04/2022  Chief Complaint  Patient presents with  . Follow-up    Right hip pain, and discuss kidneys   . Depression  . Gastroesophageal Reflux  . Hyperlipidemia  . Hypertension  . Allergies  . Quality Metric Gaps    shingrix    HPI Kristen Pennington presents for a follow-up visit for  Right hip muscle strain, taking excedrine migraine.   CKD stage 3a Elevated   Current Medication: Outpatient Encounter Medications as of 02/04/2022  Medication Sig  . acetaminophen (TYLENOL) 500 MG tablet Take 500 mg by mouth every 6 (six) hours as needed.  . bisoprolol-hydrochlorothiazide (ZIAC) 5-6.25 MG tablet Take 1 tablet by mouth daily.  . Cholecalciferol (VITAMIN D3) 5000 units TABS Take 5,000 Units by mouth daily.  . cyclobenzaprine (FLEXERIL) 10 MG tablet TAKE 1 TABLET BY MOUTH AT BEDTIME FOR BACK SPASMS AS NEEDED ONLY. (Patient taking differently: Take 10 mg by mouth at bedtime as needed for muscle spasms.)  . estradiol (ESTRACE) 0.1 MG/GM vaginal cream USE ONCE WEEKLY AS NEEDED  . fluticasone (FLONASE) 50 MCG/ACT nasal spray 1 SPRAY IN EACH NOSTRIL ONCE DAILY AS NEEDED (Patient taking differently: Place 1 spray into both nostrils daily. 1 SPRAY IN EACH NOSTRIL ONCE DAILY AS NEEDED)  . furosemide (LASIX) 20 MG tablet TAKE 1 TABLET BY MOUTH ONCE DAILY  . Liniments (SALONPAS PAIN RELIEF PATCH EX) Apply 1 patch topically 2 (two) times daily.  Marland Kitchen lubiprostone (AMITIZA) 8 MCG capsule Take 1 capsule (8 mcg total) by mouth daily with breakfast. TAKE 1 CAPSULE BY MOUTH ONCE DAILY FOR CONSTIPATION  . montelukast (SINGULAIR) 10 MG tablet Take 1 tablet (10 mg total) by mouth at bedtime.  . Multiple Vitamins-Minerals (CENTRUM SILVER 50+WOMEN) TABS Take by mouth.  . ondansetron (ZOFRAN) 4 MG tablet Take 1 tablet (4 mg total) by mouth  every 8 (eight) hours as needed for nausea or vomiting.  Vladimir Faster Glycol-Propyl Glycol (SYSTANE) 0.4-0.3 % SOLN Apply to eye.  . polyethylene glycol powder (GLYCOLAX/MIRALAX) 17 GM/SCOOP powder Take 17 g by mouth daily as needed for mild constipation or moderate constipation.  Marland Kitchen rOPINIRole (REQUIP) 1 MG tablet Take 1 tablet (1 mg total) by mouth 3 (three) times daily as needed (restless legs). (Patient taking differently: Take 1 mg by mouth 3 (three) times daily as needed (restless legs). Pt takes 1 pill in am and 2 pills at bedtime)  . traMADol (ULTRAM) 50 MG tablet TAKE 1 TABLET BY MOUTH TWICE DAILY IF NEEDED FOR SEVERE PAIN  . traZODone (DESYREL) 50 MG tablet TAKE 1/2 TO 1 TABLET BY MOUTH AT BEDTIME   No facility-administered encounter medications on file as of 02/04/2022.    Surgical History: Past Surgical History:  Procedure Laterality Date  . COLONOSCOPY  05-03-15  . COLONOSCOPY WITH PROPOFOL N/A 05/03/2015   Procedure: COLONOSCOPY WITH PROPOFOL;  Surgeon: Lucilla Lame, MD;  Location: [place];  Service: Endoscopy;  Laterality: N/A;  CPAP  . DILATION AND CURETTAGE OF UTERUS  1970  . ENDOMETRIAL BIOPSY    . EYE SURGERY Right 2017  . POLYPECTOMY  05/03/2015   Procedure: POLYPECTOMY;  Surgeon: Lucilla Lame, MD;  Location: [place];  Service: Endoscopy;;  . ROTATOR CUFF REPAIR  2007  . SPINE SURGERY      Medical History: Past Medical History:  Diagnosis  Date  . Arthritis   . Atrophic vaginitis 03/06/2013   Last Assessment & Plan:  She will plan to continue estradiol vaginal cream.  Prescription was rewritten stipulating use of the generic product.   . Atypical migraine 03/06/2013   Last Assessment & Plan:  Since she rarely takes sumatriptan, we will discontinue it. Continue sparing use of OTC migraine products.   . Avitaminosis D 03/06/2013   Last Assessment & Plan:  Recheck vitamin D level. Plan to continue vitamin D supplementation.   . Ceratitis 08/01/2013    Last Assessment & Plan:  Because of the expense, she will discontinue Restasis. She will use over-the-counter moisturizing eyedrops and liquid gel.   . Colon polyp   . 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   . 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   . Cystocele, midline 03/06/2013  . Demoralization and apathy 03/06/2013   Last Assessment & Plan:  Since she has been taking bupropion only once a day, I will write the prescription with the correct instructions and correct quantity. She has done well on this for years and she is encouraged to take it regularly   . Depression   . Environmental allergies   . Epistaxis   . Essential (primary) hypertension 05/17/2013   Last Assessment & Plan:  Her blood pressure is well-controlled. We will plan to continue hydrochlorothiazide and benazepril.  Both are generic and should be affordable on her health plan.   Marland Kitchen GERD (gastroesophageal reflux disease)   . Hemorrhoid   . Inflammation of sacroiliac joint ([place]) 03/06/2013   Last Assessment & Plan:  She is doing well on her present regimen of fentanyl patch supplemented with sparing use of tramadol/acetaminophen. We will continue that regimen.   . Sinusitis   . Sleep apnea    CPAP    Family History: Family History  Problem Relation Age of Onset  . Arthritis Mother   . Alzheimer's disease Father   . Heart disease Sister   . Cancer Brother   . Ovarian cancer Neg Hx   . Breast cancer Neg Hx   . Colon cancer Neg Hx   . Diabetes Neg Hx     Social History   Socioeconomic History  . Marital status: Widowed    Spouse name: Not on file  . Number of children: 2  . Years of education: 52  . Highest education level: Some college, no degree  Occupational History  . Not on file  Tobacco Use  . Smoking status: Never     Passive exposure: Never  . Smokeless tobacco: Never  Vaping Use  . Vaping Use: Never used  Substance and Sexual Activity  . Alcohol use: No    Alcohol/week: 0.0 standard drinks of alcohol  . Drug use: No  . Sexual activity: Not Currently  Other Topics Concern  . Not on file  Social History Narrative  . Not on file   Social Determinants of Health   Financial Resource Strain: Low Risk  (12/31/2021)   Overall Financial Resource Strain (CARDIA)   . Difficulty of Paying Living Expenses: Not hard at all  Food Insecurity: No Food Insecurity (12/31/2021)   Hunger Vital Sign   . Worried About Charity fundraiser in the Last Year: Never true   . Ran Out of Food in  the Last Year: Never true  Transportation Needs: No Transportation Needs (12/31/2021)   PRAPARE - Transportation   . Lack of Transportation (Medical): No   . Lack of Transportation (Non-Medical): No  Physical Activity: Inactive (12/31/2021)   Exercise Vital Sign   . Days of Exercise per Week: 0 days   . Minutes of Exercise per Session: 0 min  Stress: Stress Concern Present (12/31/2021)   [place]   . Feeling of Stress : To some extent  Social Connections: Moderately Isolated (12/31/2021)   Social Connection and Isolation Panel [NHANES]   . Frequency of Communication with Friends and Family: More than three times a week   . Frequency of Social Gatherings with Friends and Family: More than three times a week   . Attends Religious Services: More than 4 times per year   . Active Member of Clubs or Organizations: No   . Attends Archivist Meetings: Never   . Marital Status: Widowed  Intimate Partner Violence: Not At Risk (12/31/2021)   Humiliation, Afraid, Rape, and Kick questionnaire   . Fear of Current or Ex-Partner: No   . Emotionally Abused: No   . Physically Abused: No   . Sexually Abused: No      Review of Systems  Vital Signs: BP (!) 144/60    Pulse 65   Temp 98.5 F (36.9 C)   Resp 16   Ht '5\' 5"'$  (1.651 m)   Wt 125 lb (56.7 kg)   SpO2 98%   BMI 20.80 kg/m    Physical Exam     Assessment/Plan:   General Counseling: Jerrilyn verbalizes understanding of the findings of todays visit and agrees with plan of treatment. I have discussed any further diagnostic evaluation that may be needed or ordered today. We also reviewed her medications today. she has been encouraged to call the office with any questions or concerns that should arise related to todays visit.    No orders of the defined types were placed in this encounter.   No orders of the defined types were placed in this encounter.   No follow-ups on file.   Total time spent:*** Minutes Time spent includes review of chart, medications, test results, and follow up plan with the patient.   [place] Controlled Substance Database was reviewed by me.  This patient was seen by Jonetta Osgood, FNP-C in collaboration with Dr. Clayborn Bigness as a part of collaborative care agreement.   Natasia Sanko R. Valetta Fuller, MSN, FNP-C Internal medicine

## 2022-02-04 NOTE — Progress Notes (Signed)
[facility] [place], [place] [address]  Internal MEDICINE  Office Visit Note  Patient Name: Kristen Pennington  [id number]  [id number]  Date of Service: 02/04/2022  Chief Complaint  Patient presents with  . Follow-up    Right hip pain, and discuss kidneys   . Depression  . Gastroesophageal Reflux  . Hyperlipidemia  . Hypertension  . Allergies  . Quality Metric Gaps    shingrix    HPI Therese presents for a follow up visit for hypertension, GERD, right hip pain, abnormal renal function and depression. --taking lexapro 20 mg daily for depression and anxiety which is adequate in controlling her depressive symptoms and anxiety level. She takes trazodone as well for sleep and states that this remains effective.  --according to current lab results, she is in stage 3 CKD, discussed this in detail. Her eGFR is 51-54. Creatinine did improve slightly to 0.99 but has been chronically elevated.  Right hip muscle strain while cleaning her home, taking excedrine migraine which is effective. Would like something a little stronger if the pain is more severe and OTC medications do not help.      Current Medication: Outpatient Encounter Medications as of 02/04/2022  Medication Sig  . acetaminophen (TYLENOL) 500 MG tablet Take 500 mg by mouth every 6 (six) hours as needed.  . Cholecalciferol (VITAMIN D3) 5000 units TABS Take 5,000 Units by mouth daily.  Marland Kitchen estradiol (ESTRACE) 0.1 MG/GM vaginal cream USE ONCE WEEKLY AS NEEDED  . fluticasone (FLONASE) 50 MCG/ACT nasal spray 1 SPRAY IN EACH NOSTRIL ONCE DAILY AS NEEDED (Patient taking differently: Place 1 spray into both nostrils daily. 1 SPRAY IN EACH NOSTRIL ONCE DAILY AS NEEDED)  . furosemide (LASIX) 20 MG tablet TAKE 1 TABLET BY MOUTH ONCE DAILY  . Liniments (SALONPAS PAIN RELIEF PATCH EX) Apply 1 patch topically 2 (two) times daily.  Marland Kitchen lubiprostone (AMITIZA) 8 MCG capsule Take 1 capsule (8 mcg total) by mouth daily with  breakfast. TAKE 1 CAPSULE BY MOUTH ONCE DAILY FOR CONSTIPATION  . Multiple Vitamins-Minerals (CENTRUM SILVER 50+WOMEN) TABS Take by mouth.  . ondansetron (ZOFRAN) 4 MG tablet Take 1 tablet (4 mg total) by mouth every 8 (eight) hours as needed for nausea or vomiting.  Vladimir Faster Glycol-Propyl Glycol (SYSTANE) 0.4-0.3 % SOLN Apply to eye.  Marland Kitchen rOPINIRole (REQUIP) 1 MG tablet Take 1 tablet (1 mg total) by mouth 3 (three) times daily as needed (restless legs). (Patient taking differently: Take 1 mg by mouth 3 (three) times daily as needed (restless legs). Pt takes 1 pill in am and 2 pills at bedtime)  . [DISCONTINUED] bisoprolol-hydrochlorothiazide (ZIAC) 5-6.25 MG tablet Take 1 tablet by mouth daily.  . [DISCONTINUED] cyclobenzaprine (FLEXERIL) 10 MG tablet TAKE 1 TABLET BY MOUTH AT BEDTIME FOR BACK SPASMS AS NEEDED ONLY. (Patient taking differently: Take 10 mg by mouth at bedtime as needed for muscle spasms.)  . [DISCONTINUED] montelukast (SINGULAIR) 10 MG tablet Take 1 tablet (10 mg total) by mouth at bedtime.  . [DISCONTINUED] polyethylene glycol powder (GLYCOLAX/MIRALAX) 17 GM/SCOOP powder Take 17 g by mouth daily as needed for mild constipation or moderate constipation.  . [DISCONTINUED] traMADol (ULTRAM) 50 MG tablet TAKE 1 TABLET BY MOUTH TWICE DAILY IF NEEDED FOR SEVERE PAIN  . [DISCONTINUED] traZODone (DESYREL) 50 MG tablet TAKE 1/2 TO 1 TABLET BY MOUTH AT BEDTIME  . escitalopram (LEXAPRO) 20 MG tablet Take 20 mg by mouth daily.  . traMADol (ULTRAM) 50 MG tablet Take 1  tablet (50 mg total) by mouth every 6 (six) hours as needed for moderate pain or severe pain. TAKE 1 TABLET BY MOUTH TWICE DAILY IF NEEDED FOR SEVERE PAIN   No facility-administered encounter medications on file as of 02/04/2022.    Surgical History: Past Surgical History:  Procedure Laterality Date  . COLONOSCOPY  05-03-15  . COLONOSCOPY WITH PROPOFOL N/A 05/03/2015   Procedure: COLONOSCOPY WITH PROPOFOL;  Surgeon: Lucilla Lame, MD;  Location: [place];  Service: Endoscopy;  Laterality: N/A;  CPAP  . DILATION AND CURETTAGE OF UTERUS  1970  . ENDOMETRIAL BIOPSY    . EYE SURGERY Right 2017  . POLYPECTOMY  05/03/2015   Procedure: POLYPECTOMY;  Surgeon: Lucilla Lame, MD;  Location: [place];  Service: Endoscopy;;  . ROTATOR CUFF REPAIR  2007  . SPINE SURGERY      Medical History: Past Medical History:  Diagnosis Date  . Arthritis   . Atrophic vaginitis 03/06/2013   Last Assessment & Plan:  She will plan to continue estradiol vaginal cream.  Prescription was rewritten stipulating use of the generic product.   . Atypical migraine 03/06/2013   Last Assessment & Plan:  Since she rarely takes sumatriptan, we will discontinue it. Continue sparing use of OTC migraine products.   . Avitaminosis D 03/06/2013   Last Assessment & Plan:  Recheck vitamin D level. Plan to continue vitamin D supplementation.   . Ceratitis 08/01/2013   Last Assessment & Plan:  Because of the expense, she will discontinue Restasis. She will use over-the-counter moisturizing eyedrops and liquid gel.   . Colon polyp   . 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   . 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   . Cystocele, midline 03/06/2013  . Demoralization and apathy 03/06/2013   Last Assessment & Plan:  Since she has been taking bupropion only once a day, I will write the prescription with the correct instructions and correct quantity. She has done well on this for years and she is encouraged to take it regularly   . Depression   . Environmental allergies   . Epistaxis   . Essential (primary) hypertension 05/17/2013   Last Assessment & Plan:  Her blood pressure is well-controlled. We will plan to continue hydrochlorothiazide and benazepril.  Both  are generic and should be affordable on her health plan.   Marland Kitchen GERD (gastroesophageal reflux disease)   . Hemorrhoid   . Inflammation of sacroiliac joint ([place]) 03/06/2013   Last Assessment & Plan:  She is doing well on her present regimen of fentanyl patch supplemented with sparing use of tramadol/acetaminophen. We will continue that regimen.   . Sinusitis   . Sleep apnea    CPAP    Family History: Family History  Problem Relation Age of Onset  . Arthritis Mother   . Alzheimer's disease Father   . Heart disease Sister   . Cancer Brother   . Ovarian cancer Neg Hx   . Breast cancer Neg Hx   . Colon cancer Neg Hx   . Diabetes Neg Hx     Social History   Socioeconomic History  . Marital status: Widowed    Spouse name: Not on file  . Number of children: 2  . Years of education: 29  . Highest education level: Some college,  no degree  Occupational History  . Not on file  Tobacco Use  . Smoking status: Never    Passive exposure: Never  . Smokeless tobacco: Never  Vaping Use  . Vaping Use: Never used  Substance and Sexual Activity  . Alcohol use: No    Alcohol/week: 0.0 standard drinks of alcohol  . Drug use: No  . Sexual activity: Not Currently  Other Topics Concern  . Not on file  Social History Narrative  . Not on file   Social Determinants of Health   Financial Resource Strain: Low Risk  (12/31/2021)   Overall Financial Resource Strain (CARDIA)   . Difficulty of Paying Living Expenses: Not hard at all  Food Insecurity: No Food Insecurity (12/31/2021)   Hunger Vital Sign   . Worried About Charity fundraiser in the Last Year: Never true   . Ran Out of Food in the Last Year: Never true  Transportation Needs: No Transportation Needs (12/31/2021)   PRAPARE - Transportation   . Lack of Transportation (Medical): No   . Lack of Transportation (Non-Medical): No  Physical Activity: Inactive (12/31/2021)   Exercise Vital Sign   . Days of Exercise per Week: 0 days   .  Minutes of Exercise per Session: 0 min  Stress: Stress Concern Present (12/31/2021)   [place]   . Feeling of Stress : To some extent  Social Connections: Moderately Isolated (12/31/2021)   Social Connection and Isolation Panel [NHANES]   . Frequency of Communication with Friends and Family: More than three times a week   . Frequency of Social Gatherings with Friends and Family: More than three times a week   . Attends Religious Services: More than 4 times per year   . Active Member of Clubs or Organizations: No   . Attends Archivist Meetings: Never   . Marital Status: Widowed  Intimate Partner Violence: Not At Risk (12/31/2021)   Humiliation, Afraid, Rape, and Kick questionnaire   . Fear of Current or Ex-Partner: No   . Emotionally Abused: No   . Physically Abused: No   . Sexually Abused: No     Review of Systems  Constitutional:  Negative for chills, fatigue and unexpected weight change.  HENT:  Negative for congestion, rhinorrhea, sneezing and sore throat.   Eyes:  Negative for redness.  Respiratory: Negative.  Negative for cough, chest tightness, shortness of breath and wheezing.   Cardiovascular: Negative.  Negative for chest pain and palpitations.  Gastrointestinal:  Negative for abdominal pain, constipation, diarrhea, nausea and vomiting.  Genitourinary:  Negative for dysuria and frequency.  Musculoskeletal:  Positive for arthralgias. Negative for back pain, joint swelling and neck pain.  Skin:  Negative for rash.  Neurological: Negative.  Negative for tremors and numbness.  Hematological:  Negative for adenopathy. Does not bruise/bleed easily.  Psychiatric/Behavioral:  Positive for behavioral problems (Depression). Negative for self-injury, sleep disturbance and suicidal ideas. The patient is nervous/anxious.       Vital Signs: BP 138/60   Pulse 65   Temp 98.5 F (36.9 C)   Resp 16   Ht 5'  5" (1.651 m)   Wt 125 lb (56.7 kg)   SpO2 98%   BMI 20.80 kg/m    Physical Exam Vitals reviewed.        Assessment/Plan:   General Counseling: eshal propps understanding of the findings of todays visit and agrees with plan of treatment. I have discussed  any further diagnostic evaluation that may be needed or ordered today. We also reviewed her medications today. she has been encouraged to call the office with any questions or concerns that should arise related to todays visit.    Orders Placed This Encounter  Procedures  . US Renal Artery Stenosis    Meds ordered this encounter  Medications  . traMADol (ULTRAM) 50 MG tablet    Sig: Take 1 tablet (50 mg total) by mouth every 6 (six) hours as needed for moderate pain or severe pain. TAKE 1 TABLET BY MOUTH TWICE DAILY IF NEEDED FOR SEVERE PAIN    Dispense:  120 tablet    Refill:  1    Please  note increased frequency and number of tablets **BUBBLE PACK PT** please make sure celebrex is discontinued as patient is allergic to celebrex    Return in about 1 month (around 03/07/2022) for F/U, U/S @ [facility].   Total time spent:*** Minutes Time spent includes review of chart, medications, test results, and follow up plan with the patient.   [place] Controlled Substance Database was reviewed by me.  This patient was seen by Jonetta Osgood, FNP-C in collaboration with Dr. Clayborn Bigness as a part of collaborative care agreement.   Senita Corredor R. Valetta Fuller, MSN, FNP-C Internal medicine

## 2022-02-11 ENCOUNTER — Other Ambulatory Visit: Payer: Self-pay | Admitting: Nurse Practitioner

## 2022-02-11 DIAGNOSIS — F411 Generalized anxiety disorder: Secondary | ICD-10-CM

## 2022-02-11 DIAGNOSIS — F331 Major depressive disorder, recurrent, moderate: Secondary | ICD-10-CM

## 2022-02-23 ENCOUNTER — Ambulatory Visit: Payer: Medicare HMO

## 2022-02-23 DIAGNOSIS — N1831 Chronic kidney disease, stage 3a: Secondary | ICD-10-CM

## 2022-03-05 ENCOUNTER — Ambulatory Visit (INDEPENDENT_AMBULATORY_CARE_PROVIDER_SITE_OTHER): Payer: Medicare HMO | Admitting: Nurse Practitioner

## 2022-03-05 ENCOUNTER — Encounter: Payer: Self-pay | Admitting: Nurse Practitioner

## 2022-03-05 VITALS — BP 130/69 | HR 74 | Temp 97.4°F | Resp 16 | Ht 64.0 in | Wt 125.0 lb

## 2022-03-05 DIAGNOSIS — I10 Essential (primary) hypertension: Secondary | ICD-10-CM

## 2022-03-05 DIAGNOSIS — Z760 Encounter for issue of repeat prescription: Secondary | ICD-10-CM

## 2022-03-05 DIAGNOSIS — R0982 Postnasal drip: Secondary | ICD-10-CM | POA: Diagnosis not present

## 2022-03-05 DIAGNOSIS — S29012A Strain of muscle and tendon of back wall of thorax, initial encounter: Secondary | ICD-10-CM

## 2022-03-05 DIAGNOSIS — F411 Generalized anxiety disorder: Secondary | ICD-10-CM

## 2022-03-05 DIAGNOSIS — N1831 Chronic kidney disease, stage 3a: Secondary | ICD-10-CM | POA: Diagnosis not present

## 2022-03-05 DIAGNOSIS — K5900 Constipation, unspecified: Secondary | ICD-10-CM

## 2022-03-05 DIAGNOSIS — G4701 Insomnia due to medical condition: Secondary | ICD-10-CM

## 2022-03-05 MED ORDER — LEVOCETIRIZINE DIHYDROCHLORIDE 5 MG PO TABS: 5.0000 mg | ORAL_TABLET | Freq: Every evening | ORAL | 4 refills | Status: DC

## 2022-03-05 MED ORDER — BISOPROLOL-HYDROCHLOROTHIAZIDE 5-6.25 MG PO TABS: 1.0000 | ORAL_TABLET | Freq: Every day | ORAL | 6 refills | Status: DC

## 2022-03-05 MED ORDER — TRAZODONE HCL 50 MG PO TABS: 25.0000 mg | ORAL_TABLET | Freq: Every day | ORAL | 6 refills | Status: DC

## 2022-03-05 MED ORDER — POLYETHYLENE GLYCOL 3350 17 GM/SCOOP PO POWD: 17.0000 g | Freq: Every day | ORAL | 3 refills | Status: DC | PRN

## 2022-03-05 MED ORDER — CYCLOBENZAPRINE HCL 10 MG PO TABS: 10.0000 mg | ORAL_TABLET | Freq: Every evening | ORAL | 3 refills | Status: DC | PRN

## 2022-03-05 NOTE — Progress Notes (Signed)
[facility] [place], [place] [address]  Internal MEDICINE  Office Visit Note  Patient Name: Kristen Pennington  [id number]  [id number]  Date of Service: 03/05/2022  Chief Complaint  Patient presents with   Follow-up    U/s   Depression   Hyperlipidemia   Hypertension    HPI Tashena present for a follow up visit to review renal artery ultrasound.  --renal artery ultrasound was ordered because her kidney function labs were abnormal. The ultrasound was normal and no renal stenosis was seen. --Montelukast not helping, want to try something different for postnasal drip  --needs additional refills   Current Medication: Outpatient Encounter Medications as of 03/05/2022  Medication Sig   acetaminophen (TYLENOL) 500 MG tablet Take 500 mg by mouth every 6 (six) hours as needed.   Cholecalciferol (VITAMIN D3) 5000 units TABS Take 5,000 Units by mouth daily.   estradiol (ESTRACE) 0.1 MG/GM vaginal cream USE ONCE WEEKLY AS NEEDED   fluticasone (FLONASE) 50 MCG/ACT nasal spray 1 SPRAY IN EACH NOSTRIL ONCE DAILY AS NEEDED (Patient taking differently: Place 1 spray into both nostrils daily. 1 SPRAY IN EACH NOSTRIL ONCE DAILY AS NEEDED)   furosemide (LASIX) 20 MG tablet TAKE 1 TABLET BY MOUTH ONCE DAILY   levocetirizine (XYZAL) 5 MG tablet Take 1 tablet (5 mg total) by mouth every evening.   Liniments (SALONPAS PAIN RELIEF PATCH EX) Apply 1 patch topically 2 (two) times daily.   lubiprostone (AMITIZA) 8 MCG capsule Take 1 capsule (8 mcg total) by mouth daily with breakfast. TAKE 1 CAPSULE BY MOUTH ONCE DAILY FOR CONSTIPATION   Multiple Vitamins-Minerals (CENTRUM SILVER 50+WOMEN) TABS Take by mouth.   ondansetron (ZOFRAN) 4 MG tablet Take 1 tablet (4 mg total) by mouth every 8 (eight) hours as needed for nausea or vomiting.   Polyethyl Glycol-Propyl Glycol (SYSTANE) 0.4-0.3 % SOLN Apply to eye.   rOPINIRole (REQUIP) 1 MG tablet Take 1 tablet (1 mg total) by mouth 3 (three)  times daily as needed (restless legs). (Patient taking differently: Take 1 mg by mouth 3 (three) times daily as needed (restless legs). Pt takes 1 pill in am and 2 pills at bedtime)   traMADol (ULTRAM) 50 MG tablet Take 1 tablet (50 mg total) by mouth every 6 (six) hours as needed for moderate pain or severe pain. TAKE 1 TABLET BY MOUTH TWICE DAILY IF NEEDED FOR SEVERE PAIN   [DISCONTINUED] bisoprolol-hydrochlorothiazide (ZIAC) 5-6.25 MG tablet Take 1 tablet by mouth daily.   [DISCONTINUED] cyclobenzaprine (FLEXERIL) 10 MG tablet TAKE 1 TABLET BY MOUTH AT BEDTIME FOR BACK SPASMS AS NEEDED ONLY. (Patient taking differently: Take 10 mg by mouth at bedtime as needed for muscle spasms.)   [DISCONTINUED] montelukast (SINGULAIR) 10 MG tablet Take 1 tablet (10 mg total) by mouth at bedtime.   [DISCONTINUED] polyethylene glycol powder (GLYCOLAX/MIRALAX) 17 GM/SCOOP powder Take 17 g by mouth daily as needed for mild constipation or moderate constipation.   [DISCONTINUED] traZODone (DESYREL) 50 MG tablet TAKE 1/2 TO 1 TABLET BY MOUTH AT BEDTIME   bisoprolol-hydrochlorothiazide (ZIAC) 5-6.25 MG tablet Take 1 tablet by mouth daily.   cyclobenzaprine (FLEXERIL) 10 MG tablet Take 1 tablet (10 mg total) by mouth at bedtime as needed for muscle spasms.   polyethylene glycol powder (GLYCOLAX/MIRALAX) 17 GM/SCOOP powder Take 17 g by mouth daily as needed for mild constipation or moderate constipation.   traZODone (DESYREL) 50 MG tablet Take 0.5-1 tablets (25-50 mg total) by mouth at bedtime.  No facility-administered encounter medications on file as of 03/05/2022.    Surgical History: Past Surgical History:  Procedure Laterality Date   COLONOSCOPY  05-03-15   COLONOSCOPY WITH PROPOFOL N/A 05/03/2015   Procedure: COLONOSCOPY WITH PROPOFOL;  Surgeon: Lucilla Lame, MD;  Location: [place];  Service: Endoscopy;  Laterality: N/A;  CPAP   DILATION AND CURETTAGE OF UTERUS  1970   ENDOMETRIAL BIOPSY     EYE  SURGERY Right 2017   POLYPECTOMY  05/03/2015   Procedure: POLYPECTOMY;  Surgeon: Lucilla Lame, MD;  Location: [place];  Service: Endoscopy;;   ROTATOR CUFF REPAIR  2007   SPINE SURGERY      Medical History: Past Medical History:  Diagnosis Date   Arthritis    Atrophic vaginitis 03/06/2013   Last Assessment & Plan:  She will plan to continue estradiol vaginal cream.  Prescription was rewritten stipulating use of the generic product.    Atypical migraine 03/06/2013   Last Assessment & Plan:  Since she rarely takes sumatriptan, we will discontinue it. Continue sparing use of OTC migraine products.    Avitaminosis D 03/06/2013   Last Assessment & Plan:  Recheck vitamin D level. Plan to continue vitamin D supplementation.    Ceratitis 08/01/2013   Last Assessment & Plan:  Because of the expense, she will discontinue Restasis. She will use over-the-counter moisturizing eyedrops and liquid gel.    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    Cystocele, midline 03/06/2013   Demoralization and apathy 03/06/2013   Last Assessment & Plan:  Since she has been taking bupropion only once a day, I will write the prescription with the correct instructions and correct quantity. She has done well on this for years and she is encouraged to take it regularly    Depression    Environmental allergies    Epistaxis    Essential (primary) hypertension 05/17/2013   Last Assessment & Plan:  Her blood pressure is well-controlled. We will plan to continue hydrochlorothiazide and benazepril.  Both are generic and should be affordable on her health plan.    GERD (gastroesophageal reflux disease)    Hemorrhoid    Inflammation of sacroiliac joint ([place]) 03/06/2013   Last  Assessment & Plan:  She is doing well on her present regimen of fentanyl patch supplemented with sparing use of tramadol/acetaminophen. We will continue that regimen.    Sinusitis    Sleep apnea    CPAP    Family History: Family History  Problem Relation Age of Onset   Arthritis Mother    Alzheimer's disease Father    Heart disease Sister    Cancer Brother    Ovarian cancer Neg Hx    Breast cancer Neg Hx    Colon cancer Neg Hx    Diabetes Neg Hx     Social History   Socioeconomic History   Marital status: Widowed    Spouse name: Not on file   Number of children: 2   Years of education: 70   Highest education level: Some college, no degree  Occupational History   Not on file  Tobacco Use   Smoking status: Never    Passive exposure: Never   Smokeless tobacco: Never  Vaping Use  Vaping Use: Never used  Substance and Sexual Activity   Alcohol use: No    Alcohol/week: 0.0 standard drinks of alcohol   Drug use: No   Sexual activity: Not Currently  Other Topics Concern   Not on file  Social History Narrative   Not on file   Social Determinants of Health   Financial Resource Strain: Low Risk  (12/31/2021)   Overall Financial Resource Strain (CARDIA)    Difficulty of Paying Living Expenses: Not hard at all  Food Insecurity: No Food Insecurity (12/31/2021)   Hunger Vital Sign    Worried About Running Out of Food in the Last Year: Never true    Ran Out of Food in the Last Year: Never true  Transportation Needs: No Transportation Needs (12/31/2021)   PRAPARE - Hydrologist (Medical): No    Lack of Transportation (Non-Medical): No  Physical Activity: Inactive (12/31/2021)   Exercise Vital Sign    Days of Exercise per Week: 0 days    Minutes of Exercise per Session: 0 min  Stress: Stress Concern Present (12/31/2021)   [place]    Feeling of Stress : To some extent  Social  Connections: Moderately Isolated (12/31/2021)   Social Connection and Isolation Panel [NHANES]    Frequency of Communication with Friends and Family: More than three times a week    Frequency of Social Gatherings with Friends and Family: More than three times a week    Attends Religious Services: More than 4 times per year    Active Member of [employer] or Organizations: No    Attends Archivist Meetings: Never    Marital Status: Widowed  Intimate Partner Violence: Not At Risk (12/31/2021)   Humiliation, Afraid, Rape, and Kick questionnaire    Fear of Current or Ex-Partner: No    Emotionally Abused: No    Physically Abused: No    Sexually Abused: No      Review of Systems  Constitutional:  Negative for chills, fatigue and unexpected weight change.  HENT:  Positive for postnasal drip. Negative for congestion, rhinorrhea, sneezing and sore throat.   Eyes:  Negative for redness.  Respiratory: Negative.  Negative for cough, chest tightness, shortness of breath and wheezing.   Cardiovascular: Negative.  Negative for chest pain and palpitations.  Gastrointestinal:  Negative for abdominal pain, constipation, diarrhea, nausea and vomiting.  Genitourinary:  Negative for dysuria and frequency.  Musculoskeletal:  Positive for arthralgias. Negative for back pain, joint swelling and neck pain.  Skin:  Negative for rash.  Neurological: Negative.  Negative for tremors and numbness.  Hematological:  Negative for adenopathy. Does not bruise/bleed easily.  Psychiatric/Behavioral:  Negative for self-injury, sleep disturbance and suicidal ideas. Behavioral problem: Depression.The patient is nervous/anxious.     Vital Signs: BP 130/69   Pulse 74   Temp (!) 97.4 F (36.3 C)   Resp 16   Ht '5\' 4"'$  (1.626 m)   Wt 125 lb (56.7 kg)   SpO2 96%   BMI 21.46 kg/m    Physical Exam Constitutional:      General: She is not in acute distress.    Appearance: Normal appearance. She is normal weight.  She is not ill-appearing.  HENT:     Head: Normocephalic and atraumatic.  Eyes:     Pupils: Pupils are equal, round, and reactive to light.  Cardiovascular:     Rate and Rhythm: Normal rate and regular  rhythm.  Pulmonary:     Effort: Pulmonary effort is normal. No respiratory distress.  Neurological:     Mental Status: She is alert and oriented to person, place, and time.  Psychiatric:        Mood and Affect: Mood normal.        Behavior: Behavior normal.        Assessment/Plan: 1. Stage 3a chronic kidney disease (HCC) Normal renal artery ultrasound. Continue to monitor kidney function labs every 6-12 months   2. Post-nasal drip Montelukast discontinued. Levocetirizine 5 mg daily at bedtime prescribed.   3. Medication refill - polyethylene glycol powder (GLYCOLAX/MIRALAX) 17 GM/SCOOP powder; Take 17 g by mouth daily as needed for mild constipation or moderate constipation.  Dispense: 238 g; Refill: 3 - cyclobenzaprine (FLEXERIL) 10 MG tablet; Take 1 tablet (10 mg total) by mouth at bedtime as needed for muscle spasms.  Dispense: 30 tablet; Refill: 3 - bisoprolol-hydrochlorothiazide (ZIAC) 5-6.25 MG tablet; Take 1 tablet by mouth daily.  Dispense: 30 tablet; Refill: 6 - traZODone (DESYREL) 50 MG tablet; Take 0.5-1 tablets (25-50 mg total) by mouth at bedtime.  Dispense: 30 tablet; Refill: 6   General Counseling: maryem shuffler understanding of the findings of todays visit and agrees with plan of treatment. I have discussed any further diagnostic evaluation that may be needed or ordered today. We also reviewed her medications today. she has been encouraged to call the office with any questions or concerns that should arise related to todays visit.    No orders of the defined types were placed in this encounter.   Meds ordered this encounter  Medications   levocetirizine (XYZAL) 5 MG tablet    Sig: Take 1 tablet (5 mg total) by mouth every evening.    Dispense:  30 tablet     Refill:  4    Please run through insurance, it is covered. Please discontinue montelukast today   polyethylene glycol powder (GLYCOLAX/MIRALAX) 17 GM/SCOOP powder    Sig: Take 17 g by mouth daily as needed for mild constipation or moderate constipation.    Dispense:  238 g    Refill:  3    Please fun through insurance if they cover this medication   cyclobenzaprine (FLEXERIL) 10 MG tablet    Sig: Take 1 tablet (10 mg total) by mouth at bedtime as needed for muscle spasms.    Dispense:  30 tablet    Refill:  3    As needed at bedtime   bisoprolol-hydrochlorothiazide (ZIAC) 5-6.25 MG tablet    Sig: Take 1 tablet by mouth daily.    Dispense:  30 tablet    Refill:  6   traZODone (DESYREL) 50 MG tablet    Sig: Take 0.5-1 tablets (25-50 mg total) by mouth at bedtime.    Dispense:  30 tablet    Refill:  6    FOR FUTURE REFILLS **BUBBLE PACK PT**    Return in about 2 months (around 05/05/2022) for F/U, BP check, med refill, eval new med, Ly Bacchi PCP.   Total time spent:30 Minutes Time spent includes review of chart, medications, test results, and follow up plan with the patient.   [place] Controlled Substance Database was reviewed by me.  This patient was seen by Jonetta Osgood, FNP-C in collaboration with Dr. Clayborn Bigness as a part of collaborative care agreement.   Callista Hoh R. Valetta Fuller, MSN, FNP-C Internal medicine

## 2022-03-10 ENCOUNTER — Encounter: Payer: Self-pay | Admitting: Nurse Practitioner

## 2022-03-17 ENCOUNTER — Ambulatory Visit: Payer: Medicare HMO | Admitting: Nurse Practitioner

## 2022-04-15 ENCOUNTER — Other Ambulatory Visit: Payer: Self-pay | Admitting: Nurse Practitioner

## 2022-04-15 ENCOUNTER — Ambulatory Visit (INDEPENDENT_AMBULATORY_CARE_PROVIDER_SITE_OTHER): Payer: Medicare HMO | Admitting: Nurse Practitioner

## 2022-04-15 ENCOUNTER — Encounter: Payer: Self-pay | Admitting: Nurse Practitioner

## 2022-04-15 VITALS — BP 140/80 | HR 69 | Temp 97.4°F | Resp 16 | Ht 64.0 in | Wt 129.6 lb

## 2022-04-15 DIAGNOSIS — Z23 Encounter for immunization: Secondary | ICD-10-CM | POA: Diagnosis not present

## 2022-04-15 DIAGNOSIS — G8929 Other chronic pain: Secondary | ICD-10-CM

## 2022-04-15 DIAGNOSIS — R300 Dysuria: Secondary | ICD-10-CM

## 2022-04-15 DIAGNOSIS — I10 Essential (primary) hypertension: Secondary | ICD-10-CM

## 2022-04-15 DIAGNOSIS — Z0001 Encounter for general adult medical examination with abnormal findings: Secondary | ICD-10-CM

## 2022-04-15 DIAGNOSIS — Z760 Encounter for issue of repeat prescription: Secondary | ICD-10-CM | POA: Diagnosis not present

## 2022-04-15 DIAGNOSIS — G2581 Restless legs syndrome: Secondary | ICD-10-CM

## 2022-04-15 MED ORDER — FEXOFENADINE HCL 180 MG PO TABS: 180.0000 mg | ORAL_TABLET | Freq: Every day | ORAL | 3 refills | Status: DC

## 2022-04-15 MED ORDER — ROPINIROLE HCL 1 MG PO TABS: ORAL_TABLET | ORAL | 5 refills | Status: DC

## 2022-04-15 MED ORDER — FUROSEMIDE 20 MG PO TABS: 20.0000 mg | ORAL_TABLET | Freq: Every day | ORAL | 3 refills | Status: DC

## 2022-04-15 MED ORDER — TRAMADOL HCL 50 MG PO TABS: 50.0000 mg | ORAL_TABLET | Freq: Four times a day (QID) | ORAL | 1 refills | Status: DC | PRN

## 2022-04-15 MED ORDER — ESCITALOPRAM OXALATE 20 MG PO TABS: 20.0000 mg | ORAL_TABLET | Freq: Every day | ORAL | 5 refills | Status: DC

## 2022-04-15 NOTE — Progress Notes (Signed)
[facility] [place], [place] [id number]  Internal MEDICINE  Office Visit Note  Patient Name: Kristen Pennington  [id number]  [id number]  Date of Service: 04/15/2022  Chief Complaint  Patient presents with   Medicare Wellness   Gastroesophageal Reflux   Depression   Hyperlipidemia   Hypertension    HPI Kristen Pennington presents for an annual well visit and physical exam.  Well appearing 88 year old female with hypertension, osteoarthritis, GERD, macular degeneration, memory loss, IBS-C, and allergic rhinitis. Labs deferred BP stable with current medications Is having a harder time keeping track of her medications, her meds are pre-packaged and delivered to her by [facility].  No new or worsening pain, no other concerns.     Current Medication: Outpatient Encounter Medications as of 04/15/2022  Medication Sig   acetaminophen (TYLENOL) 500 MG tablet Take 500 mg by mouth every 6 (six) hours as needed.   bisoprolol-hydrochlorothiazide (ZIAC) 5-6.25 MG tablet Take 1 tablet by mouth daily.   Cholecalciferol (VITAMIN D3) 5000 units TABS Take 5,000 Units by mouth daily.   cyclobenzaprine (FLEXERIL) 10 MG tablet Take 1 tablet (10 mg total) by mouth at bedtime as needed for muscle spasms.   estradiol (ESTRACE) 0.1 MG/GM vaginal cream USE ONCE WEEKLY AS NEEDED   fexofenadine (ALLEGRA) 180 MG tablet Take 1 tablet (180 mg total) by mouth daily. For nasal drainage.   fluticasone (FLONASE) 50 MCG/ACT nasal spray 1 SPRAY IN EACH NOSTRIL ONCE DAILY AS NEEDED (Patient taking differently: Place 1 spray into both nostrils daily. 1 SPRAY IN EACH NOSTRIL ONCE DAILY AS NEEDED)   Liniments (SALONPAS PAIN RELIEF PATCH EX) Apply 1 patch topically 2 (two) times daily.   lubiprostone (AMITIZA) 8 MCG capsule Take 1 capsule (8 mcg total) by mouth daily with breakfast. TAKE 1 CAPSULE BY MOUTH ONCE DAILY FOR CONSTIPATION   Multiple Vitamins-Minerals (CENTRUM SILVER 50+WOMEN) TABS Take by  mouth.   ondansetron (ZOFRAN) 4 MG tablet Take 1 tablet (4 mg total) by mouth every 8 (eight) hours as needed for nausea or vomiting.   Polyethyl Glycol-Propyl Glycol (SYSTANE) 0.4-0.3 % SOLN Apply to eye.   polyethylene glycol powder (GLYCOLAX/MIRALAX) 17 GM/SCOOP powder Take 17 g by mouth daily as needed for mild constipation or moderate constipation.   traZODone (DESYREL) 50 MG tablet Take 0.5-1 tablets (25-50 mg total) by mouth at bedtime.   [DISCONTINUED] escitalopram (LEXAPRO) 20 MG tablet Take 20 mg by mouth daily.   [DISCONTINUED] furosemide (LASIX) 20 MG tablet TAKE 1 TABLET BY MOUTH ONCE DAILY   [DISCONTINUED] levocetirizine (XYZAL) 5 MG tablet Take 1 tablet (5 mg total) by mouth every evening.   [DISCONTINUED] rOPINIRole (REQUIP) 1 MG tablet Take 1 tablet (1 mg total) by mouth 3 (three) times daily as needed (restless legs). (Patient taking differently: Take 1 mg by mouth 3 (three) times daily as needed (restless legs). Pt takes 1 pill in am and 2 pills at bedtime)   [DISCONTINUED] traMADol (ULTRAM) 50 MG tablet Take 1 tablet (50 mg total) by mouth every 6 (six) hours as needed for moderate pain or severe pain. TAKE 1 TABLET BY MOUTH TWICE DAILY IF NEEDED FOR SEVERE PAIN   escitalopram (LEXAPRO) 20 MG tablet Take 1 tablet (20 mg total) by mouth daily.   furosemide (LASIX) 20 MG tablet Take 1 tablet (20 mg total) by mouth daily.   rOPINIRole (REQUIP) 1 MG tablet Take 1 tablet by mouth in am and 2 tablets by mouth at bedtime  traMADol (ULTRAM) 50 MG tablet Take 1 tablet (50 mg total) by mouth every 6 (six) hours as needed for moderate pain or severe pain.   No facility-administered encounter medications on file as of 04/15/2022.    Surgical History: Past Surgical History:  Procedure Laterality Date   COLONOSCOPY  05-03-15   COLONOSCOPY WITH PROPOFOL N/A 05/03/2015   Procedure: COLONOSCOPY WITH PROPOFOL;  Surgeon: Lucilla Lame, MD;  Location: [facility];  Service: Endoscopy;   Laterality: N/A;  CPAP   DILATION AND CURETTAGE OF UTERUS  1970   ENDOMETRIAL BIOPSY     EYE SURGERY Right 2017   POLYPECTOMY  05/03/2015   Procedure: POLYPECTOMY;  Surgeon: Lucilla Lame, MD;  Location: [facility];  Service: Endoscopy;;   ROTATOR CUFF REPAIR  2007   SPINE SURGERY      Medical History: Past Medical History:  Diagnosis Date   Arthritis    Atrophic vaginitis 03/06/2013   Last Assessment & Plan:  She will plan to continue estradiol vaginal cream.  Prescription was rewritten stipulating use of the generic product.    Atypical migraine 03/06/2013   Last Assessment & Plan:  Since she rarely takes sumatriptan, we will discontinue it. Continue sparing use of OTC migraine products.    Avitaminosis D 03/06/2013   Last Assessment & Plan:  Recheck vitamin D level. Plan to continue vitamin D supplementation.    Ceratitis 08/01/2013   Last Assessment & Plan:  Because of the expense, she will discontinue Restasis. She will use over-the-counter moisturizing eyedrops and liquid gel.    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    Cystocele, midline 03/06/2013   Demoralization and apathy 03/06/2013   Last Assessment & Plan:  Since she has been taking bupropion only once a day, I will write the prescription with the correct instructions and correct quantity. She has done well on this for years and she is encouraged to take it regularly    Depression    Environmental allergies    Epistaxis    Essential (primary) hypertension 05/17/2013   Last Assessment & Plan:  Her blood pressure is well-controlled. We will plan to continue hydrochlorothiazide and benazepril.  Both are generic and should be affordable on her health plan.    GERD (gastroesophageal  reflux disease)    Hemorrhoid    Inflammation of sacroiliac joint ([place]) 03/06/2013   Last Assessment & Plan:  She is doing well on her present regimen of fentanyl patch supplemented with sparing use of tramadol/acetaminophen. We will continue that regimen.    Sinusitis    Sleep apnea    CPAP    Family History: Family History  Problem Relation Age of Onset   Arthritis Mother    Alzheimer's disease Father    Heart disease Sister    Cancer Brother    Ovarian cancer Neg Hx    Breast cancer Neg Hx    Colon cancer Neg Hx    Diabetes Neg Hx     Social History   Socioeconomic History   Marital status: Widowed    Spouse name: Not on file   Number of children: 2   Years of education: 29   Highest education level: Some college, no degree  Occupational History  Not on file  Tobacco Use   Smoking status: Never    Passive exposure: Never   Smokeless tobacco: Never  Vaping Use   Vaping Use: Never used  Substance and Sexual Activity   Alcohol use: No    Alcohol/week: 0.0 standard drinks of alcohol   Drug use: No   Sexual activity: Not Currently  Other Topics Concern   Not on file  Social History Narrative   Not on file   Social Determinants of Health   Financial Resource Strain: Low Risk  (12/31/2021)   Overall Financial Resource Strain (CARDIA)    Difficulty of Paying Living Expenses: Not hard at all  Food Insecurity: No Food Insecurity (12/31/2021)   Hunger Vital Sign    Worried About Running Out of Food in the Last Year: Never true    Ran Out of Food in the Last Year: Never true  Transportation Needs: No Transportation Needs (12/31/2021)   PRAPARE - Hydrologist (Medical): No    Lack of Transportation (Non-Medical): No  Physical Activity: Inactive (12/31/2021)   Exercise Vital Sign    Days of Exercise per Week: 0 days    Minutes of Exercise per Session: 0 min  Stress: Stress Concern Present (12/31/2021)   [place]    Feeling of Stress : To some extent  Social Connections: Moderately Isolated (12/31/2021)   Social Connection and Isolation Panel [NHANES]    Frequency of Communication with Friends and Family: More than three times a week    Frequency of Social Gatherings with Friends and Family: More than three times a week    Attends Religious Services: More than 4 times per year    Active Member of [employer] or Organizations: No    Attends Archivist Meetings: Never    Marital Status: Widowed  Intimate Partner Violence: Not At Risk (12/31/2021)   Humiliation, Afraid, Rape, and Kick questionnaire    Fear of Current or Ex-Partner: No    Emotionally Abused: No    Physically Abused: No    Sexually Abused: No      Review of Systems  Constitutional:  Negative for chills, diaphoresis and fatigue.  HENT:  Negative for ear pain, postnasal drip and sinus pressure.   Eyes:  Negative for photophobia, discharge, redness, itching and visual disturbance.  Respiratory:  Negative for cough, shortness of breath and wheezing.   Cardiovascular:  Negative for chest pain, palpitations and leg swelling.  Gastrointestinal:  Negative for abdominal pain, constipation, diarrhea, nausea and vomiting.  Genitourinary:  Negative for dysuria and flank pain.  Musculoskeletal:  Negative for arthralgias, back pain, gait problem and neck pain.  Skin:  Negative for color change.  Allergic/Immunologic: Negative for environmental allergies and food allergies.  Neurological:  Negative for dizziness and headaches.  Hematological:  Does not bruise/bleed easily.  Psychiatric/Behavioral:  Negative for agitation, behavioral problems (depression) and hallucinations.     Vital Signs: BP (!) 140/80   Pulse 69   Temp (!) 97.4 F (36.3 C)   Resp 16   Ht '5\' 4"'$  (1.626 m)   Wt 129 lb 9.6 oz (58.8 kg)   SpO2 95%   BMI 22.25 kg/m    Physical Exam Vitals reviewed.  Constitutional:       General: She is not in acute distress.    Appearance: Normal appearance. She is well-developed and normal weight. She is not ill-appearing or diaphoretic.  HENT:  Head: Normocephalic and atraumatic.     Right Ear: Tympanic membrane, ear canal and external ear normal.     Left Ear: Tympanic membrane, ear canal and external ear normal.     Nose: Nose normal.     Mouth/Throat:     Mouth: Mucous membranes are moist.     Pharynx: Oropharynx is clear. No oropharyngeal exudate or posterior oropharyngeal erythema.  Eyes:     Extraocular Movements: Extraocular movements intact.     Conjunctiva/sclera: Conjunctivae normal.     Pupils: Pupils are equal, round, and reactive to light.  Neck:     Thyroid: No thyromegaly.     Vascular: No JVD.     Trachea: No tracheal deviation.  Cardiovascular:     Rate and Rhythm: Normal rate and regular rhythm.     Pulses: Normal pulses.     Heart sounds: Normal heart sounds. No murmur heard.    No friction rub. No gallop.  Pulmonary:     Effort: Pulmonary effort is normal. No respiratory distress.     Breath sounds: No wheezing or rales.  Chest:     Chest wall: No tenderness.  Abdominal:     General: Bowel sounds are normal.     Palpations: Abdomen is soft.  Musculoskeletal:        General: Normal range of motion.     Cervical back: Normal range of motion and neck supple.  Lymphadenopathy:     Cervical: No cervical adenopathy.  Skin:    General: Skin is warm and dry.     Capillary Refill: Capillary refill takes less than 2 seconds.  Neurological:     Mental Status: She is alert and oriented to person, place, and time.     Cranial Nerves: No cranial nerve deficit.  Psychiatric:        Mood and Affect: Mood normal.        Behavior: Behavior normal.        Thought Content: Thought content normal.        Judgment: Judgment normal.        Assessment/Plan: 1. Encounter for general adult medical examination with abnormal  findings Age-appropriate preventive screenings and vaccinations discussed, annual physical exam completed. Routine labs for health maintenance deferred for now. PHM updated.   2. Dysuria Routine urinalysis done - UA/M w/rflx Culture, Routine - Microscopic Examination - Urine Culture, Reflex  3. Medication refill - fexofenadine (ALLEGRA) 180 MG tablet; Take 1 tablet (180 mg total) by mouth daily. For nasal drainage.  Dispense: 30 tablet; Refill: 3 - traMADol (ULTRAM) 50 MG tablet; Take 1 tablet (50 mg total) by mouth every 6 (six) hours as needed for moderate pain or severe pain.  Dispense: 120 tablet; Refill: 1 - rOPINIRole (REQUIP) 1 MG tablet; Take 1 tablet by mouth in am and 2 tablets by mouth at bedtime  Dispense: 90 tablet; Refill: 5 - furosemide (LASIX) 20 MG tablet; Take 1 tablet (20 mg total) by mouth daily.  Dispense: 30 tablet; Refill: 3 - escitalopram (LEXAPRO) 20 MG tablet; Take 1 tablet (20 mg total) by mouth daily.  Dispense: 30 tablet; Refill: 5  4. Needs flu shot Administered in office today - Flu Vaccine MDCK QUAD PF      General Counseling: xochilt conant understanding of the findings of todays visit and agrees with plan of treatment. I have discussed any further diagnostic evaluation that may be needed or ordered today. We also reviewed her medications today. she has been encouraged to  call the office with any questions or concerns that should arise related to todays visit.    Orders Placed This Encounter  Procedures   Flu Vaccine MDCK QUAD PF    Meds ordered this encounter  Medications   fexofenadine (ALLEGRA) 180 MG tablet    Sig: Take 1 tablet (180 mg total) by mouth daily. For nasal drainage.    Dispense:  30 tablet    Refill:  3    Discontinue levocetirizine, start fexofenadine for nasal drainage   traMADol (ULTRAM) 50 MG tablet    Sig: Take 1 tablet (50 mg total) by mouth every 6 (six) hours as needed for moderate pain or severe pain.    Dispense:   120 tablet    Refill:  1   rOPINIRole (REQUIP) 1 MG tablet    Sig: Take 1 tablet by mouth in am and 2 tablets by mouth at bedtime    Dispense:  90 tablet    Refill:  5    refills   furosemide (LASIX) 20 MG tablet    Sig: Take 1 tablet (20 mg total) by mouth daily.    Dispense:  30 tablet    Refill:  3    FOR FUTURE REFILLS **BUBBLE PACK PT**   escitalopram (LEXAPRO) 20 MG tablet    Sig: Take 1 tablet (20 mg total) by mouth daily.    Dispense:  30 tablet    Refill:  5    Patient should take as a daily scheduled medication    Return in about 2 months (around 06/15/2022) for F/U, med refill, Anxiety/depression, Cielle Aguila PCP and need nurse visit for ear lavage.   Total time spent:30 Minutes Time spent includes review of chart, medications, test results, and follow up plan with the patient.   [place] Controlled Substance Database was reviewed by me.  This patient was seen by Jonetta Osgood, FNP-C in collaboration with Dr. Clayborn Bigness as a part of collaborative care agreement.  Tremar Wickens R. Valetta Fuller, MSN, FNP-C Internal medicine

## 2022-04-18 LAB — MICROSCOPIC EXAMINATION
Bacteria, UA: NONE SEEN
Casts: NONE SEEN /lpf
RBC, Urine: NONE SEEN /hpf (ref 0–2)

## 2022-04-18 LAB — UA/M W/RFLX CULTURE, ROUTINE
Bilirubin, UA: NEGATIVE
Glucose, UA: NEGATIVE
Ketones, UA: NEGATIVE
Nitrite, UA: NEGATIVE
Protein,UA: NEGATIVE
RBC, UA: NEGATIVE
Specific Gravity, UA: 1.015 (ref 1.005–1.030)
Urobilinogen, Ur: 0.2 mg/dL (ref 0.2–1.0)
pH, UA: 7 (ref 5.0–7.5)

## 2022-04-18 LAB — URINE CULTURE, REFLEX

## 2022-04-29 ENCOUNTER — Ambulatory Visit (INDEPENDENT_AMBULATORY_CARE_PROVIDER_SITE_OTHER): Payer: Medicare HMO

## 2022-04-29 ENCOUNTER — Other Ambulatory Visit: Payer: Self-pay | Admitting: Nurse Practitioner

## 2022-04-29 DIAGNOSIS — H6123 Impacted cerumen, bilateral: Secondary | ICD-10-CM

## 2022-04-29 DIAGNOSIS — E538 Deficiency of other specified B group vitamins: Secondary | ICD-10-CM

## 2022-04-29 DIAGNOSIS — R0982 Postnasal drip: Secondary | ICD-10-CM

## 2022-04-29 MED ORDER — CYANOCOBALAMIN 1000 MCG/ML IJ SOLN
1000.0000 ug | Freq: Once | INTRAMUSCULAR | Status: AC
  Administered 2022-04-29: 1000 ug via INTRAMUSCULAR

## 2022-05-05 ENCOUNTER — Ambulatory Visit: Payer: Medicare HMO | Admitting: Nurse Practitioner

## 2022-05-09 ENCOUNTER — Encounter: Payer: Self-pay | Admitting: Nurse Practitioner

## 2022-05-11 DIAGNOSIS — H353131 Nonexudative age-related macular degeneration, bilateral, early dry stage: Secondary | ICD-10-CM | POA: Diagnosis not present

## 2022-05-24 ENCOUNTER — Encounter: Payer: Self-pay | Admitting: Nurse Practitioner

## 2022-06-09 ENCOUNTER — Other Ambulatory Visit: Payer: Self-pay | Admitting: Nurse Practitioner

## 2022-06-09 DIAGNOSIS — Z760 Encounter for issue of repeat prescription: Secondary | ICD-10-CM

## 2022-06-17 ENCOUNTER — Ambulatory Visit: Payer: Medicare HMO | Admitting: Nurse Practitioner

## 2022-06-22 ENCOUNTER — Ambulatory Visit: Payer: Medicare HMO | Admitting: Internal Medicine

## 2022-06-23 ENCOUNTER — Encounter: Payer: Self-pay | Admitting: Nurse Practitioner

## 2022-06-23 ENCOUNTER — Ambulatory Visit (INDEPENDENT_AMBULATORY_CARE_PROVIDER_SITE_OTHER): Payer: Medicare HMO | Admitting: Nurse Practitioner

## 2022-06-23 VITALS — BP 140/82 | HR 70 | Temp 97.0°F | Resp 16 | Ht 64.0 in | Wt 133.2 lb

## 2022-06-23 DIAGNOSIS — K581 Irritable bowel syndrome with constipation: Secondary | ICD-10-CM | POA: Diagnosis not present

## 2022-06-23 DIAGNOSIS — S8012XA Contusion of left lower leg, initial encounter: Secondary | ICD-10-CM

## 2022-06-23 DIAGNOSIS — I10 Essential (primary) hypertension: Secondary | ICD-10-CM | POA: Diagnosis not present

## 2022-06-23 MED ORDER — MAGNESIUM OXIDE 400 MG PO TABS: 400.0000 mg | ORAL_TABLET | Freq: Every day | ORAL | 3 refills | Status: DC | PRN

## 2022-06-23 NOTE — Progress Notes (Signed)
[facility] [place], [place] [address]  Internal MEDICINE  Office Visit Note  Patient Name: Kristen Pennington  [id number]  [id number]  Date of Service: 06/23/2022  Chief Complaint  Patient presents with   Follow-up   Depression   Gastroesophageal Reflux   Hypertension   Hyperlipidemia    HPI Josaphine presents for a follow up visit for hypertension, left leg injury and medication review.  Blood pressure is stable Left lower leg hematoma, most likely from injury, will take time to heal. Is tender to touch Wants to stop taking amitiza, lexapro and fexofenadine.  Has constipation sometimes -- takes fiber supplement, instructed to take prunes, coffee and stat magnesium oxide.      Current Medication: Outpatient Encounter Medications as of 06/23/2022  Medication Sig   acetaminophen (TYLENOL) 500 MG tablet Take 500 mg by mouth every 6 (six) hours as needed.   bisoprolol-hydrochlorothiazide (ZIAC) 5-6.25 MG tablet Take 1 tablet by mouth daily.   Cholecalciferol (VITAMIN D3) 5000 units TABS Take 5,000 Units by mouth daily.   cyclobenzaprine (FLEXERIL) 10 MG tablet Take 1 tablet (10 mg total) by mouth at bedtime as needed for muscle spasms.   estradiol (ESTRACE) 0.1 MG/GM vaginal cream USE ONCE WEEKLY AS NEEDED   fluticasone (FLONASE) 50 MCG/ACT nasal spray 1 SPRAY IN EACH NOSTRIL ONCE DAILY AS NEEDED (Patient taking differently: Place 1 spray into both nostrils daily. 1 SPRAY IN EACH NOSTRIL ONCE DAILY AS NEEDED)   furosemide (LASIX) 20 MG tablet Take 1 tablet (20 mg total) by mouth daily.   Liniments (SALONPAS PAIN RELIEF PATCH EX) Apply 1 patch topically 2 (two) times daily.   magnesium oxide (MAG-OX) 400 MG tablet Take 1 tablet (400 mg total) by mouth daily as needed (sleep and constipation). At bedtime   Multiple Vitamins-Minerals (CENTRUM SILVER 50+WOMEN) TABS Take by mouth.   ondansetron (ZOFRAN) 4 MG tablet Take 1 tablet (4 mg total) by mouth every 8  (eight) hours as needed for nausea or vomiting.   Polyethyl Glycol-Propyl Glycol (SYSTANE) 0.4-0.3 % SOLN Apply to eye.   polyethylene glycol powder (GLYCOLAX/MIRALAX) 17 GM/SCOOP powder Take 17 g by mouth daily as needed for mild constipation or moderate constipation.   rOPINIRole (REQUIP) 1 MG tablet Take 1 tablet by mouth in am and 2 tablets by mouth at bedtime   traMADol (ULTRAM) 50 MG tablet Take 1 tablet (50 mg total) by mouth every 6 (six) hours as needed for moderate pain or severe pain.   traZODone (DESYREL) 50 MG tablet Take 0.5-1 tablets (25-50 mg total) by mouth at bedtime.   [DISCONTINUED] escitalopram (LEXAPRO) 20 MG tablet TAKE ONE TABLET BY MOUTH DAILY   [DISCONTINUED] fexofenadine (ALLEGRA) 180 MG tablet Take 1 tablet (180 mg total) by mouth daily. For nasal drainage.   [DISCONTINUED] lubiprostone (AMITIZA) 8 MCG capsule Take 1 capsule (8 mcg total) by mouth daily with breakfast. TAKE 1 CAPSULE BY MOUTH ONCE DAILY FOR CONSTIPATION   No facility-administered encounter medications on file as of 06/23/2022.    Surgical History: Past Surgical History:  Procedure Laterality Date   COLONOSCOPY  05-03-15   COLONOSCOPY WITH PROPOFOL N/A 05/03/2015   Procedure: COLONOSCOPY WITH PROPOFOL;  Surgeon: Lucilla Lame, MD;  Location: [facility];  Service: Endoscopy;  Laterality: N/A;  CPAP   DILATION AND CURETTAGE OF UTERUS  1970   ENDOMETRIAL BIOPSY     EYE SURGERY Right 2017   POLYPECTOMY  05/03/2015   Procedure: POLYPECTOMY;  Surgeon: Evangeline Gula  Allen Norris, MD;  Location: [facility];  Service: Endoscopy;;   ROTATOR CUFF REPAIR  2007   SPINE SURGERY      Medical History: Past Medical History:  Diagnosis Date   Arthritis    Atrophic vaginitis 03/06/2013   Last Assessment & Plan:  She will plan to continue estradiol vaginal cream.  Prescription was rewritten stipulating use of the generic product.    Atypical migraine 03/06/2013   Last Assessment & Plan:  Since she rarely  takes sumatriptan, we will discontinue it. Continue sparing use of OTC migraine products.    Avitaminosis D 03/06/2013   Last Assessment & Plan:  Recheck vitamin D level. Plan to continue vitamin D supplementation.    Ceratitis 08/01/2013   Last Assessment & Plan:  Because of the expense, she will discontinue Restasis. She will use over-the-counter moisturizing eyedrops and liquid gel.    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    Cystocele, midline 03/06/2013   Demoralization and apathy 03/06/2013   Last Assessment & Plan:  Since she has been taking bupropion only once a day, I will write the prescription with the correct instructions and correct quantity. She has done well on this for years and she is encouraged to take it regularly    Depression    Environmental allergies    Epistaxis    Essential (primary) hypertension 05/17/2013   Last Assessment & Plan:  Her blood pressure is well-controlled. We will plan to continue hydrochlorothiazide and benazepril.  Both are generic and should be affordable on her health plan.    GERD (gastroesophageal reflux disease)    Hemorrhoid    Inflammation of sacroiliac joint ([place]) 03/06/2013   Last Assessment & Plan:  She is doing well on her present regimen of fentanyl patch supplemented with sparing use of tramadol/acetaminophen. We will continue that regimen.    Sinusitis    Sleep apnea    CPAP    Family History: Family History  Problem Relation Age of Onset   Arthritis Mother    Alzheimer's disease Father    Heart disease Sister    Cancer Brother    Ovarian cancer Neg Hx    Breast cancer Neg Hx    Colon cancer Neg Hx    Diabetes Neg Hx     Social History   Socioeconomic History   Marital status: Widowed     Spouse name: Not on file   Number of children: 2   Years of education: 57   Highest education level: Some college, no degree  Occupational History   Not on file  Tobacco Use   Smoking status: Never    Passive exposure: Never   Smokeless tobacco: Never  Vaping Use   Vaping Use: Never used  Substance and Sexual Activity   Alcohol use: No    Alcohol/week: 0.0 standard drinks of alcohol   Drug use: No   Sexual activity: Not Currently  Other Topics Concern   Not on file  Social History Narrative   Not on file   Social Determinants of Health   Financial Resource Strain: Low Risk  (12/31/2021)   Overall Financial Resource Strain (CARDIA)    Difficulty of Paying Living Expenses: Not hard at all  Food  Insecurity: No Food Insecurity (12/31/2021)   Hunger Vital Sign    Worried About Running Out of Food in the Last Year: Never true    Ran Out of Food in the Last Year: Never true  Transportation Needs: No Transportation Needs (12/31/2021)   PRAPARE - Hydrologist (Medical): No    Lack of Transportation (Non-Medical): No  Physical Activity: Inactive (12/31/2021)   Exercise Vital Sign    Days of Exercise per Week: 0 days    Minutes of Exercise per Session: 0 min  Stress: Stress Concern Present (12/31/2021)   [place]    Feeling of Stress : To some extent  Social Connections: Moderately Isolated (12/31/2021)   Social Connection and Isolation Panel [NHANES]    Frequency of Communication with Friends and Family: More than three times a week    Frequency of Social Gatherings with Friends and Family: More than three times a week    Attends Religious Services: More than 4 times per year    Active Member of [employer] or Organizations: No    Attends Archivist Meetings: Never    Marital Status: Widowed  Intimate Partner Violence: Not At Risk (12/31/2021)   Humiliation, Afraid, Rape, and Kick  questionnaire    Fear of Current or Ex-Partner: No    Emotionally Abused: No    Physically Abused: No    Sexually Abused: No      Review of Systems  Constitutional:  Negative for chills, fatigue and unexpected weight change.  HENT:  Positive for postnasal drip. Negative for congestion, rhinorrhea, sneezing and sore throat.   Eyes:  Negative for redness.  Respiratory: Negative.  Negative for cough, chest tightness, shortness of breath and wheezing.   Cardiovascular: Negative.  Negative for chest pain and palpitations.  Gastrointestinal:  Negative for abdominal pain, constipation, diarrhea, nausea and vomiting.  Genitourinary:  Negative for dysuria and frequency.  Musculoskeletal:  Positive for arthralgias. Negative for back pain, joint swelling and neck pain.  Skin:  Negative for rash.  Neurological: Negative.  Negative for tremors and numbness.  Hematological:  Negative for adenopathy. Does not bruise/bleed easily.  Psychiatric/Behavioral:  Negative for self-injury, sleep disturbance and suicidal ideas. Behavioral problem: Depression.The patient is nervous/anxious.     Vital Signs: BP (!) 140/82 Comment: 172/74  Pulse 70   Temp (!) 97 F (36.1 C)   Resp 16   Ht '5\' 4"'$  (1.626 m)   Wt 133 lb 3.2 oz (60.4 kg)   SpO2 96%   BMI 22.86 kg/m    Physical Exam Vitals reviewed.  Constitutional:      General: She is not in acute distress.    Appearance: Normal appearance. She is normal weight. She is not ill-appearing.  HENT:     Head: Normocephalic and atraumatic.  Eyes:     Pupils: Pupils are equal, round, and reactive to light.  Cardiovascular:     Rate and Rhythm: Normal rate and regular rhythm.  Pulmonary:     Effort: Pulmonary effort is normal. No respiratory distress.  Neurological:     Mental Status: She is alert and oriented to person, place, and time.  Psychiatric:        Mood and Affect: Mood normal.        Behavior: Behavior normal.         Assessment/Plan: 1. Hematoma of left lower leg Information discussed and handout given to patient about hematomas. Encouraged  icing the area to alleviate pain, patient is already on tramadol which should help with pain as well. Instructed patient that the hematoma will take several weeks to resolve.   2. Essential (primary) hypertension Stable, continue medications as prescribed.   3. Irritable bowel syndrome with constipation Start magnesium oxide daily at bedtime. Continue fiber supplement.  - magnesium oxide (MAG-OX) 400 MG tablet; Take 1 tablet (400 mg total) by mouth daily as needed (sleep and constipation). At bedtime  Dispense: 90 tablet; Refill: 3   General Counseling: kamiryn bezanson understanding of the findings of todays visit and agrees with plan of treatment. I have discussed any further diagnostic evaluation that may be needed or ordered today. We also reviewed her medications today. she has been encouraged to call the office with any questions or concerns that should arise related to todays visit.    No orders of the defined types were placed in this encounter.   Meds ordered this encounter  Medications   magnesium oxide (MAG-OX) 400 MG tablet    Sig: Take 1 tablet (400 mg total) by mouth daily as needed (sleep and constipation). At bedtime    Dispense:  90 tablet    Refill:  3    Please discontinue amitiza, escitalopram and fexofenadine please    Return in about 4 weeks (around 07/21/2022) for F/U, Ellery Tash PCP general and left leg hematoma.   Total time spent:30 Minutes Time spent includes review of chart, medications, test results, and follow up plan with the patient.   [place] Controlled Substance Database was reviewed by me.  This patient was seen by Jonetta Osgood, FNP-C in collaboration with Dr. Clayborn Bigness as a part of collaborative care agreement.   Kattie Santoyo R. Valetta Fuller, MSN, FNP-C Internal medicine

## 2022-06-29 ENCOUNTER — Other Ambulatory Visit: Payer: Self-pay | Admitting: Nurse Practitioner

## 2022-06-29 DIAGNOSIS — Z760 Encounter for issue of repeat prescription: Secondary | ICD-10-CM

## 2022-06-30 NOTE — Telephone Encounter (Signed)
Last 06/23/22

## 2022-07-01 NOTE — Telephone Encounter (Signed)
Refill ordered

## 2022-07-21 ENCOUNTER — Ambulatory Visit: Payer: Medicare HMO | Admitting: Nurse Practitioner

## 2022-07-22 ENCOUNTER — Other Ambulatory Visit: Payer: Self-pay | Admitting: Nurse Practitioner

## 2022-07-22 DIAGNOSIS — Z760 Encounter for issue of repeat prescription: Secondary | ICD-10-CM

## 2022-08-03 ENCOUNTER — Encounter: Payer: Self-pay | Admitting: Nurse Practitioner

## 2022-08-03 ENCOUNTER — Telehealth (INDEPENDENT_AMBULATORY_CARE_PROVIDER_SITE_OTHER): Payer: Medicare HMO | Admitting: Nurse Practitioner

## 2022-08-03 VITALS — Resp 16 | Ht 64.0 in | Wt 133.0 lb

## 2022-08-03 DIAGNOSIS — Z79899 Other long term (current) drug therapy: Secondary | ICD-10-CM

## 2022-08-03 DIAGNOSIS — I10 Essential (primary) hypertension: Secondary | ICD-10-CM | POA: Diagnosis not present

## 2022-08-03 MED ORDER — TRAMADOL HCL 50 MG PO TABS: ORAL_TABLET | ORAL | 0 refills | Status: DC

## 2022-08-03 MED ORDER — ROPINIROLE HCL 1 MG PO TABS: ORAL_TABLET | ORAL | 5 refills | Status: DC

## 2022-08-03 MED ORDER — CYCLOBENZAPRINE HCL 10 MG PO TABS: 10.0000 mg | ORAL_TABLET | Freq: Every evening | ORAL | 3 refills | Status: DC | PRN

## 2022-08-03 MED ORDER — BISOPROLOL-HYDROCHLOROTHIAZIDE 5-6.25 MG PO TABS: 1.0000 | ORAL_TABLET | Freq: Every day | ORAL | 6 refills | Status: DC

## 2022-08-03 MED ORDER — PROMETHAZINE HCL 12.5 MG PO TABS: 12.5000 mg | ORAL_TABLET | Freq: Four times a day (QID) | ORAL | 0 refills | Status: DC | PRN

## 2022-08-03 MED ORDER — FUROSEMIDE 20 MG PO TABS: 20.0000 mg | ORAL_TABLET | Freq: Every day | ORAL | 3 refills | Status: DC

## 2022-08-03 MED ORDER — TRAZODONE HCL 50 MG PO TABS: 25.0000 mg | ORAL_TABLET | Freq: Every day | ORAL | 6 refills | Status: DC

## 2022-08-03 NOTE — Progress Notes (Signed)
[facility] [place], [place] [address]  Internal MEDICINE  Telephone Visit  Patient Name: Kristen Pennington  [id number]  [id number]  Date of Service: 08/03/2022  I connected with the patient at 1230 by telephone and verified the patients identity using two identifiers.   I discussed the limitations, risks, security and privacy concerns of performing an evaluation and management service by telephone and the availability of in person appointments. I also discussed with the patient that there may be a patient responsible charge related to the service.  The patient expressed understanding and agrees to proceed.    Chief Complaint  Patient presents with   Telephone Assessment    Medication discuss    Telephone Screen   Depression    HPI Rosina presents for a telehealth virtual visit for medication review.  --stopped taking fexofenadine and escitalopram, and amitiza  --wants to have phenergan for nausea and vomiting, wants to discontinue zofran --doing as well as can be. --Had 1 nosebleed today  Current Medication: Outpatient Encounter Medications as of 08/03/2022  Medication Sig   acetaminophen (TYLENOL) 500 MG tablet Take 500 mg by mouth every 6 (six) hours as needed.   Cholecalciferol (VITAMIN D3) 5000 units TABS Take 5,000 Units by mouth daily.   estradiol (ESTRACE) 0.1 MG/GM vaginal cream USE ONCE WEEKLY AS NEEDED   fluticasone (FLONASE) 50 MCG/ACT nasal spray 1 SPRAY IN EACH NOSTRIL ONCE DAILY AS NEEDED (Patient taking differently: Place 1 spray into both nostrils daily. 1 SPRAY IN EACH NOSTRIL ONCE DAILY AS NEEDED)   Liniments (SALONPAS PAIN RELIEF PATCH EX) Apply 1 patch topically 2 (two) times daily.   magnesium oxide (MAG-OX) 400 MG tablet Take 1 tablet (400 mg total) by mouth daily as needed (sleep and constipation). At bedtime   Multiple Vitamins-Minerals (CENTRUM SILVER 50+WOMEN) TABS Take by mouth.   Polyethyl Glycol-Propyl Glycol (SYSTANE) 0.4-0.3  % SOLN Apply to eye.   polyethylene glycol powder (GLYCOLAX/MIRALAX) 17 GM/SCOOP powder Take 17 g by mouth daily as needed for mild constipation or moderate constipation.   promethazine (PHENERGAN) 12.5 MG tablet Take 1 tablet (12.5 mg total) by mouth every 6 (six) hours as needed for nausea or vomiting.   [DISCONTINUED] bisoprolol-hydrochlorothiazide (ZIAC) 5-6.25 MG tablet Take 1 tablet by mouth daily.   [DISCONTINUED] cyclobenzaprine (FLEXERIL) 10 MG tablet Take 1 tablet (10 mg total) by mouth at bedtime as needed for muscle spasms.   [DISCONTINUED] furosemide (LASIX) 20 MG tablet Take 1 tablet (20 mg total) by mouth daily.   [DISCONTINUED] ondansetron (ZOFRAN) 4 MG tablet Take 1 tablet (4 mg total) by mouth every 8 (eight) hours as needed for nausea or vomiting.   [DISCONTINUED] rOPINIRole (REQUIP) 1 MG tablet Take 1 tablet by mouth in am and 2 tablets by mouth at bedtime   [DISCONTINUED] traMADol (ULTRAM) 50 MG tablet TAKE ONE TABLET BY MOUTH EVERY 6 HOURS AS NEEDED FOR MODERATE OR SEVERE PAIN   [DISCONTINUED] traZODone (DESYREL) 50 MG tablet Take 0.5-1 tablets (25-50 mg total) by mouth at bedtime.   bisoprolol-hydrochlorothiazide (ZIAC) 5-6.25 MG tablet Take 1 tablet by mouth daily.   cyclobenzaprine (FLEXERIL) 10 MG tablet Take 1 tablet (10 mg total) by mouth at bedtime as needed for muscle spasms.   furosemide (LASIX) 20 MG tablet Take 1 tablet (20 mg total) by mouth daily.   rOPINIRole (REQUIP) 1 MG tablet Take 1 tablet by mouth in am and 2 tablets by mouth at bedtime   traMADol (ULTRAM) 50 MG  tablet TAKE ONE TABLET BY MOUTH EVERY 6 HOURS AS NEEDED FOR MODERATE OR SEVERE PAIN   traZODone (DESYREL) 50 MG tablet Take 0.5-1 tablets (25-50 mg total) by mouth at bedtime.   No facility-administered encounter medications on file as of 08/03/2022.    Surgical History: Past Surgical History:  Procedure Laterality Date   COLONOSCOPY  05-03-15   COLONOSCOPY WITH PROPOFOL N/A 05/03/2015    Procedure: COLONOSCOPY WITH PROPOFOL;  Surgeon: Lucilla Lame, MD;  Location: [facility];  Service: Endoscopy;  Laterality: N/A;  CPAP   DILATION AND CURETTAGE OF UTERUS  1970   ENDOMETRIAL BIOPSY     EYE SURGERY Right 2017   POLYPECTOMY  05/03/2015   Procedure: POLYPECTOMY;  Surgeon: Lucilla Lame, MD;  Location: [facility];  Service: Endoscopy;;   ROTATOR CUFF REPAIR  2007   SPINE SURGERY      Medical History: Past Medical History:  Diagnosis Date   Arthritis    Atrophic vaginitis 03/06/2013   Last Assessment & Plan:  She will plan to continue estradiol vaginal cream.  Prescription was rewritten stipulating use of the generic product.    Atypical migraine 03/06/2013   Last Assessment & Plan:  Since she rarely takes sumatriptan, we will discontinue it. Continue sparing use of OTC migraine products.    Avitaminosis D 03/06/2013   Last Assessment & Plan:  Recheck vitamin D level. Plan to continue vitamin D supplementation.    Ceratitis 08/01/2013   Last Assessment & Plan:  Because of the expense, she will discontinue Restasis. She will use over-the-counter moisturizing eyedrops and liquid gel.    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    Cystocele, midline 03/06/2013   Demoralization and apathy 03/06/2013   Last Assessment & Plan:  Since she has been taking bupropion only once a day, I will write the prescription with the correct instructions and correct quantity. She has done well on this for years and she is encouraged to take it regularly    Depression    Environmental allergies    Epistaxis    Essential (primary) hypertension 05/17/2013   Last Assessment & Plan:  Her blood pressure is well-controlled. We will plan to continue  hydrochlorothiazide and benazepril.  Both are generic and should be affordable on her health plan.    GERD (gastroesophageal reflux disease)    Hemorrhoid    Inflammation of sacroiliac joint ([place]) 03/06/2013   Last Assessment & Plan:  She is doing well on her present regimen of fentanyl patch supplemented with sparing use of tramadol/acetaminophen. We will continue that regimen.    Sinusitis    Sleep apnea    CPAP    Family History: Family History  Problem Relation Age of Onset   Arthritis Mother    Alzheimer's disease Father    Heart disease Sister    Cancer Brother    Ovarian cancer Neg Hx    Breast cancer Neg Hx    Colon cancer Neg Hx    Diabetes Neg Hx     Social History   Socioeconomic History   Marital status: Widowed    Spouse name: Not on file   Number of children: 2   Years of education: 12   Highest education  level: Some college, no degree  Occupational History   Not on file  Tobacco Use   Smoking status: Never    Passive exposure: Never   Smokeless tobacco: Never  Vaping Use   Vaping Use: Never used  Substance and Sexual Activity   Alcohol use: No    Alcohol/week: 0.0 standard drinks of alcohol   Drug use: No   Sexual activity: Not Currently  Other Topics Concern   Not on file  Social History Narrative   Not on file   Social Determinants of Health   Financial Resource Strain: Low Risk  (12/31/2021)   Overall Financial Resource Strain (CARDIA)    Difficulty of Paying Living Expenses: Not hard at all  Food Insecurity: No Food Insecurity (12/31/2021)   Hunger Vital Sign    Worried About Running Out of Food in the Last Year: Never true    Ran Out of Food in the Last Year: Never true  Transportation Needs: No Transportation Needs (12/31/2021)   PRAPARE - Hydrologist (Medical): No    Lack of Transportation (Non-Medical): No  Physical Activity: Inactive (12/31/2021)   Exercise Vital Sign    Days of Exercise per Week: 0 days     Minutes of Exercise per Session: 0 min  Stress: Stress Concern Present (12/31/2021)   [place]    Feeling of Stress : To some extent  Social Connections: Moderately Isolated (12/31/2021)   Social Connection and Isolation Panel [NHANES]    Frequency of Communication with Friends and Family: More than three times a week    Frequency of Social Gatherings with Friends and Family: More than three times a week    Attends Religious Services: More than 4 times per year    Active Member of [employer] or Organizations: No    Attends Archivist Meetings: Never    Marital Status: Widowed  Intimate Partner Violence: Not At Risk (12/31/2021)   Humiliation, Afraid, Rape, and Kick questionnaire    Fear of Current or Ex-Partner: No    Emotionally Abused: No    Physically Abused: No    Sexually Abused: No      Review of Systems  Constitutional:  Negative for chills, fatigue and unexpected weight change.  HENT:  Positive for nosebleeds (x1) and postnasal drip. Negative for congestion, rhinorrhea, sneezing and sore throat.   Eyes:  Negative for redness.  Respiratory: Negative.  Negative for cough, chest tightness, shortness of breath and wheezing.   Cardiovascular: Negative.  Negative for chest pain and palpitations.  Gastrointestinal: Negative.  Negative for abdominal pain, constipation, diarrhea, nausea and vomiting.  Genitourinary:  Negative for dysuria and frequency.  Musculoskeletal:  Positive for arthralgias. Negative for back pain, joint swelling and neck pain.  Skin:  Negative for rash.  Neurological: Negative.  Negative for tremors and numbness.  Hematological:  Negative for adenopathy. Does not bruise/bleed easily.  Psychiatric/Behavioral:  Negative for self-injury, sleep disturbance and suicidal ideas. Behavioral problem: Depression.The patient is nervous/anxious.        Feeling lonely    Vital Signs: Resp 16    Ht '5\' 4"'$  (1.626 m)   Wt 133 lb (60.3 kg)   BMI 22.83 kg/m    Observation/Objective: She is alert and oriented and engages in conversation appropriately. No acute distress noted.     Assessment/Plan: 1. Essential (primary) hypertension Reports having 1 nosebleed, thinks it is related to her running around this morning  and running her blood pressure up.  - bisoprolol-hydrochlorothiazide (ZIAC) 5-6.25 MG tablet; Take 1 tablet by mouth daily.  Dispense: 30 tablet; Refill: 6  2. Encounter for medication review Reviewed medications, ordered refills where appropriate.  - promethazine (PHENERGAN) 12.5 MG tablet; Take 1 tablet (12.5 mg total) by mouth every 6 (six) hours as needed for nausea or vomiting.  Dispense: 30 tablet; Refill: 0 - traZODone (DESYREL) 50 MG tablet; Take 0.5-1 tablets (25-50 mg total) by mouth at bedtime.  Dispense: 30 tablet; Refill: 6 - furosemide (LASIX) 20 MG tablet; Take 1 tablet (20 mg total) by mouth daily.  Dispense: 30 tablet; Refill: 3 - traMADol (ULTRAM) 50 MG tablet; TAKE ONE TABLET BY MOUTH EVERY 6 HOURS AS NEEDED FOR MODERATE OR SEVERE PAIN  Dispense: 120 tablet; Refill: 0 - bisoprolol-hydrochlorothiazide (ZIAC) 5-6.25 MG tablet; Take 1 tablet by mouth daily.  Dispense: 30 tablet; Refill: 6 - cyclobenzaprine (FLEXERIL) 10 MG tablet; Take 1 tablet (10 mg total) by mouth at bedtime as needed for muscle spasms.  Dispense: 30 tablet; Refill: 3 - rOPINIRole (REQUIP) 1 MG tablet; Take 1 tablet by mouth in am and 2 tablets by mouth at bedtime  Dispense: 90 tablet; Refill: 5   General Counseling: joselynn amoroso understanding of the findings of today's phone visit and agrees with plan of treatment. I have discussed any further diagnostic evaluation that may be needed or ordered today. We also reviewed her medications today. she has been encouraged to call the office with any questions or concerns that should arise related to todays visit.  Return in about 3 months  (around 10/28/2022) for F/U, med refill, Kallie Depolo PCP.   No orders of the defined types were placed in this encounter.   Meds ordered this encounter  Medications   promethazine (PHENERGAN) 12.5 MG tablet    Sig: Take 1 tablet (12.5 mg total) by mouth every 6 (six) hours as needed for nausea or vomiting.    Dispense:  30 tablet    Refill:  0   traZODone (DESYREL) 50 MG tablet    Sig: Take 0.5-1 tablets (25-50 mg total) by mouth at bedtime.    Dispense:  30 tablet    Refill:  6    FOR FUTURE REFILLS **BUBBLE PACK PT**   furosemide (LASIX) 20 MG tablet    Sig: Take 1 tablet (20 mg total) by mouth daily.    Dispense:  30 tablet    Refill:  3    FOR FUTURE REFILLS **BUBBLE PACK PT**   traMADol (ULTRAM) 50 MG tablet    Sig: TAKE ONE TABLET BY MOUTH EVERY 6 HOURS AS NEEDED FOR MODERATE OR SEVERE PAIN    Dispense:  120 tablet    Refill:  0    FOR FUTURE REFILLS **BUBBLE PACK PT**   bisoprolol-hydrochlorothiazide (ZIAC) 5-6.25 MG tablet    Sig: Take 1 tablet by mouth daily.    Dispense:  30 tablet    Refill:  6   cyclobenzaprine (FLEXERIL) 10 MG tablet    Sig: Take 1 tablet (10 mg total) by mouth at bedtime as needed for muscle spasms.    Dispense:  30 tablet    Refill:  3    As needed at bedtime   rOPINIRole (REQUIP) 1 MG tablet    Sig: Take 1 tablet by mouth in am and 2 tablets by mouth at bedtime    Dispense:  90 tablet    Refill:  5    refills  Time spent:20 Minutes Time spent with patient included reviewing progress notes, labs, imaging studies, and discussing plan for follow up.  [place] Controlled Substance Database was reviewed by me for overdose risk score (ORS) if appropriate.  This patient was seen by Jonetta Osgood, FNP-C in collaboration with Dr. Clayborn Bigness as a part of collaborative care agreement.  Yobana Culliton R. Valetta Fuller, MSN, FNP-C Internal medicine

## 2022-09-01 ENCOUNTER — Other Ambulatory Visit: Payer: Self-pay | Admitting: Nurse Practitioner

## 2022-09-01 DIAGNOSIS — Z79899 Other long term (current) drug therapy: Secondary | ICD-10-CM

## 2022-09-29 ENCOUNTER — Ambulatory Visit (INDEPENDENT_AMBULATORY_CARE_PROVIDER_SITE_OTHER): Payer: Medicare HMO | Admitting: Internal Medicine

## 2022-09-29 ENCOUNTER — Other Ambulatory Visit: Payer: Self-pay | Admitting: Internal Medicine

## 2022-09-29 ENCOUNTER — Encounter: Payer: Self-pay | Admitting: Internal Medicine

## 2022-09-29 VITALS — BP 165/81 | Temp 98.5°F | Resp 16 | Ht 64.0 in | Wt 131.0 lb

## 2022-09-29 DIAGNOSIS — G8929 Other chronic pain: Secondary | ICD-10-CM

## 2022-09-29 DIAGNOSIS — E538 Deficiency of other specified B group vitamins: Secondary | ICD-10-CM

## 2022-09-29 DIAGNOSIS — R300 Dysuria: Secondary | ICD-10-CM

## 2022-09-29 DIAGNOSIS — N390 Urinary tract infection, site not specified: Secondary | ICD-10-CM | POA: Diagnosis not present

## 2022-09-29 DIAGNOSIS — R319 Hematuria, unspecified: Secondary | ICD-10-CM | POA: Diagnosis not present

## 2022-09-29 LAB — POCT URINALYSIS DIPSTICK
Bilirubin, UA: NEGATIVE
Blood, UA: POSITIVE
Glucose, UA: NEGATIVE
Ketones, UA: POSITIVE
Nitrite, UA: NEGATIVE
Protein, UA: NEGATIVE
Spec Grav, UA: 1.01 (ref 1.010–1.025)
Urobilinogen, UA: 0.2 E.U./dL
pH, UA: 5 (ref 5.0–8.0)

## 2022-09-29 MED ORDER — TRAMADOL HCL 50 MG PO TABS: ORAL_TABLET | ORAL | 3 refills | Status: DC

## 2022-09-29 MED ORDER — CYANOCOBALAMIN 1000 MCG/ML IJ SOLN
1000.0000 ug | Freq: Once | INTRAMUSCULAR | Status: AC
  Administered 2022-09-29: 1000 ug via INTRAMUSCULAR

## 2022-09-29 NOTE — Progress Notes (Signed)
[facility] [place], [place] [address]  Internal MEDICINE  Office Visit Note  Patient Name: Kristen Pennington  [id number]  [id number]  Date of Service: 10/14/2022  Chief Complaint  Patient presents with   Follow-up    Patient wants to be scheduled for month B12 injections   Depression   Gastroesophageal Reflux   Hypertension   Hyperlipidemia   Quality Metric Gaps    Shingles Vaccine    HPI Pt is here with c/o burning with urination, this is off an on, she is still estrogen vaginal cream. Will like to change her tramadol prescription direction, she seems to be upste about her daughter not visiting her  No other complaints today     Current Medication: Outpatient Encounter Medications as of 09/29/2022  Medication Sig   acetaminophen (TYLENOL) 500 MG tablet Take 500 mg by mouth every 6 (six) hours as needed.   bisoprolol-hydrochlorothiazide (ZIAC) 5-6.25 MG tablet Take 1 tablet by mouth daily.   Cholecalciferol (VITAMIN D3) 5000 units TABS Take 5,000 Units by mouth daily.   cyclobenzaprine (FLEXERIL) 10 MG tablet Take 1 tablet (10 mg total) by mouth at bedtime as needed for muscle spasms.   estradiol (ESTRACE) 0.1 MG/GM vaginal cream USE ONCE WEEKLY AS NEEDED (Patient taking differently: 1 Applicatorful. USE ONCE WEEKLY AS NEEDED)   fluticasone (FLONASE) 50 MCG/ACT nasal spray 1 SPRAY IN EACH NOSTRIL ONCE DAILY AS NEEDED (Patient taking differently: Place 1 spray into both nostrils daily. 1 SPRAY IN EACH NOSTRIL ONCE DAILY AS NEEDED)   furosemide (LASIX) 20 MG tablet Take 1 tablet (20 mg total) by mouth daily.   Liniments (SALONPAS PAIN RELIEF PATCH EX) Apply 1 patch topically 2 (two) times daily.   magnesium oxide (MAG-OX) 400 MG tablet Take 1 tablet (400 mg total) by mouth daily as needed (sleep and constipation). At bedtime   Multiple Vitamins-Minerals (CENTRUM SILVER 50+WOMEN) TABS Take by mouth.   Polyethyl Glycol-Propyl Glycol (SYSTANE) 0.4-0.3 %  SOLN Apply to eye.   polyethylene glycol powder (GLYCOLAX/MIRALAX) 17 GM/SCOOP powder Take 17 g by mouth daily as needed for mild constipation or moderate constipation.   promethazine (PHENERGAN) 12.5 MG tablet Take 1 tablet (12.5 mg total) by mouth every 6 (six) hours as needed for nausea or vomiting.   rOPINIRole (REQUIP) 1 MG tablet Take 1 tablet by mouth in am and 2 tablets by mouth at bedtime   traMADol (ULTRAM) 50 MG tablet One tab 3 a day fpr pain   traZODone (DESYREL) 50 MG tablet Take 0.5-1 tablets (25-50 mg total) by mouth at bedtime.   [DISCONTINUED] traMADol (ULTRAM) 50 MG tablet TAKE 1 TABLET BY MOUTH EVERY 6 HOURS AS NEEDED FOR MODERATE TO SEVERE PAIN   [EXPIRED] cyanocobalamin (VITAMIN B12) injection 1,000 mcg    No facility-administered encounter medications on file as of 09/29/2022.    Surgical History: Past Surgical History:  Procedure Laterality Date   COLONOSCOPY  05-03-15   COLONOSCOPY WITH PROPOFOL N/A 05/03/2015   Procedure: COLONOSCOPY WITH PROPOFOL;  Surgeon: Lucilla Lame, MD;  Location: [facility];  Service: Endoscopy;  Laterality: N/A;  CPAP   DILATION AND CURETTAGE OF UTERUS  1970   ENDOMETRIAL BIOPSY     EYE SURGERY Right 2017   POLYPECTOMY  05/03/2015   Procedure: POLYPECTOMY;  Surgeon: Lucilla Lame, MD;  Location: [facility];  Service: Endoscopy;;   ROTATOR CUFF REPAIR  2007   SPINE SURGERY      Medical History: Past Medical  History:  Diagnosis Date   Arthritis    Atrophic vaginitis 03/06/2013   Last Assessment & Plan:  She will plan to continue estradiol vaginal cream.  Prescription was rewritten stipulating use of the generic product.    Atypical migraine 03/06/2013   Last Assessment & Plan:  Since she rarely takes sumatriptan, we will discontinue it. Continue sparing use of OTC migraine products.    Avitaminosis D 03/06/2013   Last Assessment & Plan:  Recheck vitamin D level. Plan to continue vitamin D supplementation.    Ceratitis  08/01/2013   Last Assessment & Plan:  Because of the expense, she will discontinue Restasis. She will use over-the-counter moisturizing eyedrops and liquid gel.    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    Cystocele, midline 03/06/2013   Demoralization and apathy 03/06/2013   Last Assessment & Plan:  Since she has been taking bupropion only once a day, I will write the prescription with the correct instructions and correct quantity. She has done well on this for years and she is encouraged to take it regularly    Depression    Environmental allergies    Epistaxis    Essential (primary) hypertension 05/17/2013   Last Assessment & Plan:  Her blood pressure is well-controlled. We will plan to continue hydrochlorothiazide and benazepril.  Both are generic and should be affordable on her health plan.    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 We will continue that regimen.    Sinusitis    Sleep apnea    CPAP    Family History: Family History  Problem Relation Age of Onset   Arthritis Mother    Alzheimer's disease Father    Heart disease Sister    Cancer Brother    Ovarian cancer Neg Hx    Breast cancer Neg Hx    Colon cancer Neg Hx    Diabetes Neg Hx     Social History   Socioeconomic History   Marital status: Widowed    Spouse name: Not on file   Number of children: 2   Years of education: 51   Highest education level: Some college, no degree  Occupational History   Not on file  Tobacco Use   Smoking status: Never    Passive  exposure: Never   Smokeless tobacco: Never  Vaping Use   Vaping Use: Never used  Substance and Sexual Activity   Alcohol use: No    Alcohol/week: 0.0 standard drinks of alcohol   Drug use: No   Sexual activity: Not Currently  Other Topics Concern   Not on file  Social History Narrative   Not on file   Social Determinants of Health   Financial Resource Strain: Low Risk  (12/31/2021)   Overall Financial Resource Strain (CARDIA)    Difficulty of Paying Living Expenses: Not hard at all  Food Insecurity: No Food Insecurity (12/31/2021)   Hunger Vital Sign    Worried About Running Out of Food in the Last Year: Never true    Ran Out  of Food in the Last Year: Never true  Transportation Needs: No Transportation Needs (12/31/2021)   PRAPARE - Hydrologist (Medical): No    Lack of Transportation (Non-Medical): No  Physical Activity: Inactive (12/31/2021)   Exercise Vital Sign    Days of Exercise per Week: 0 days    Minutes of Exercise per Session: 0 min  Stress: Stress Concern Present (12/31/2021)   [place]    Feeling of Stress : To some extent  Social Connections: Moderately Isolated (12/31/2021)   Social Connection and Isolation Panel [NHANES]    Frequency of Communication with Friends and Family: More than three times a week    Frequency of Social Gatherings with Friends and Family: More than three times a week    Attends Religious Services: More than 4 times per year    Active Member of [employer] or Organizations: No    Attends Archivist Meetings: Never    Marital Status: Widowed  Intimate Partner Violence: Not At Risk (12/31/2021)   Humiliation, Afraid, Rape, and Kick questionnaire    Fear of Current or Ex-Partner: No    Emotionally Abused: No    Physically Abused: No    Sexually Abused: No      Review of Systems  Constitutional:  Negative for fatigue and fever.  HENT:   Negative for congestion, mouth sores and postnasal drip.   Respiratory:  Negative for cough.   Cardiovascular:  Negative for chest pain.  Genitourinary:  Positive for frequency. Negative for flank pain.  Musculoskeletal:  Positive for arthralgias and gait problem.  Psychiatric/Behavioral: Negative.      Vital Signs: BP (!) 165/81   Temp 98.5 F (36.9 C)   Resp 16   Ht 5\' 4"  (1.626 m)   Wt 131 lb (59.4 kg)   SpO2 96%   BMI 22.49 kg/m    Physical Exam Constitutional:      Appearance: Normal appearance.  HENT:     Head: Normocephalic and atraumatic.     Nose: Nose normal.     Mouth/Throat:     Mouth: Mucous membranes are moist.     Pharynx: No posterior oropharyngeal erythema.  Eyes:     Extraocular Movements: Extraocular movements intact.     Pupils: Pupils are equal, round, and reactive to light.  Cardiovascular:     Pulses: Normal pulses.     Heart sounds: Normal heart sounds.  Pulmonary:     Effort: Pulmonary effort is normal.     Breath sounds: Normal breath sounds.  Neurological:     General: No focal deficit present.     Mental Status: She is alert.  Psychiatric:        Mood and Affect: Mood normal.        Behavior: Behavior normal.        Assessment/Plan: 1. Chronic primary generalized pain Change tramadol instructions per pt, seems to be doing well  - traMADol (ULTRAM) 50 MG tablet; One tab 3 a day fpr pain  Dispense: 90 tablet; Refill: 3  2. B12 deficiency Continue B12 monthly injections - cyanocobalamin (VITAMIN B12) injection 1,000 mcg  3. Dysuria Await C/S report to start therapy  - POCT Urinalysis Dipstick - CULTURE, URINE COMPREHENSIVE   General Counseling: roopa rootes understanding of the findings of todays visit and agrees with plan of treatment. I have discussed any further diagnostic evaluation that may be needed or ordered today. We also  reviewed her medications today. she has been encouraged to call the office with any questions or  concerns that should arise related to todays visit.    Orders Placed This Encounter  Procedures   CULTURE, URINE COMPREHENSIVE   POCT Urinalysis Dipstick    Meds ordered this encounter  Medications   cyanocobalamin (VITAMIN B12) injection 1,000 mcg   traMADol (ULTRAM) 50 MG tablet    Sig: One tab 3 a day fpr pain    Dispense:  90 tablet    Refill:  3    Total time spent:35 Minutes Time spent includes review of chart, medications, test results, and follow up plan with the patient.   [place] Controlled Substance Database was reviewed by me.   Dr Lavera Guise Internal medicine

## 2022-10-03 LAB — CULTURE, URINE COMPREHENSIVE

## 2022-10-13 ENCOUNTER — Ambulatory Visit (INDEPENDENT_AMBULATORY_CARE_PROVIDER_SITE_OTHER): Payer: Medicare HMO

## 2022-10-13 DIAGNOSIS — E538 Deficiency of other specified B group vitamins: Secondary | ICD-10-CM | POA: Diagnosis not present

## 2022-10-13 MED ORDER — CYANOCOBALAMIN 1000 MCG/ML IJ SOLN
1000.0000 ug | Freq: Once | INTRAMUSCULAR | Status: AC
  Administered 2022-10-13: 1000 ug via INTRAMUSCULAR

## 2022-11-09 DIAGNOSIS — Z961 Presence of intraocular lens: Secondary | ICD-10-CM | POA: Diagnosis not present

## 2022-11-09 DIAGNOSIS — H353131 Nonexudative age-related macular degeneration, bilateral, early dry stage: Secondary | ICD-10-CM | POA: Diagnosis not present

## 2022-11-09 DIAGNOSIS — H353132 Nonexudative age-related macular degeneration, bilateral, intermediate dry stage: Secondary | ICD-10-CM | POA: Diagnosis not present

## 2022-11-09 DIAGNOSIS — H35373 Puckering of macula, bilateral: Secondary | ICD-10-CM | POA: Diagnosis not present

## 2022-11-09 DIAGNOSIS — H26492 Other secondary cataract, left eye: Secondary | ICD-10-CM | POA: Diagnosis not present

## 2022-11-17 ENCOUNTER — Ambulatory Visit (INDEPENDENT_AMBULATORY_CARE_PROVIDER_SITE_OTHER): Payer: Medicare HMO

## 2022-11-17 DIAGNOSIS — E538 Deficiency of other specified B group vitamins: Secondary | ICD-10-CM | POA: Diagnosis not present

## 2022-11-17 MED ORDER — CYANOCOBALAMIN 1000 MCG/ML IJ SOLN
1000.0000 ug | Freq: Once | INTRAMUSCULAR | Status: AC
Start: 2022-11-17 — End: 2022-11-17
  Administered 2022-11-17: 1000 ug via INTRAMUSCULAR

## 2022-11-30 ENCOUNTER — Encounter: Payer: Self-pay | Admitting: Nurse Practitioner

## 2022-11-30 ENCOUNTER — Ambulatory Visit (INDEPENDENT_AMBULATORY_CARE_PROVIDER_SITE_OTHER): Payer: Medicare HMO | Admitting: Nurse Practitioner

## 2022-11-30 VITALS — BP 160/90 | HR 60 | Temp 97.9°F | Resp 16 | Ht 64.0 in | Wt 130.2 lb

## 2022-11-30 DIAGNOSIS — Z79899 Other long term (current) drug therapy: Secondary | ICD-10-CM

## 2022-11-30 DIAGNOSIS — I10 Essential (primary) hypertension: Secondary | ICD-10-CM | POA: Diagnosis not present

## 2022-11-30 DIAGNOSIS — G8929 Other chronic pain: Secondary | ICD-10-CM

## 2022-11-30 DIAGNOSIS — F331 Major depressive disorder, recurrent, moderate: Secondary | ICD-10-CM

## 2022-11-30 DIAGNOSIS — F411 Generalized anxiety disorder: Secondary | ICD-10-CM | POA: Diagnosis not present

## 2022-11-30 MED ORDER — TRAZODONE HCL 50 MG PO TABS
25.0000 mg | ORAL_TABLET | Freq: Every day | ORAL | 6 refills | Status: DC
Start: 2022-11-30 — End: 2023-03-05

## 2022-11-30 MED ORDER — ESCITALOPRAM OXALATE 10 MG PO TABS
10.0000 mg | ORAL_TABLET | Freq: Every day | ORAL | 5 refills | Status: DC
Start: 2022-11-30 — End: 2023-03-02

## 2022-11-30 MED ORDER — ROPINIROLE HCL 1 MG PO TABS
ORAL_TABLET | ORAL | 5 refills | Status: DC
Start: 2022-11-30 — End: 2023-09-14

## 2022-11-30 MED ORDER — FUROSEMIDE 20 MG PO TABS: 20.0000 mg | ORAL_TABLET | Freq: Every day | ORAL | 3 refills | Status: DC

## 2022-11-30 MED ORDER — BISOPROLOL-HYDROCHLOROTHIAZIDE 5-6.25 MG PO TABS
1.0000 | ORAL_TABLET | Freq: Every day | ORAL | 6 refills | Status: DC
Start: 2022-11-30 — End: 2023-04-19

## 2022-11-30 MED ORDER — TRAMADOL HCL 50 MG PO TABS
ORAL_TABLET | ORAL | 3 refills | Status: DC
Start: 2022-11-30 — End: 2023-02-18

## 2022-11-30 MED ORDER — CYCLOBENZAPRINE HCL 10 MG PO TABS
10.0000 mg | ORAL_TABLET | Freq: Every evening | ORAL | 3 refills | Status: DC | PRN
Start: 2022-11-30 — End: 2023-04-05
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
## 2022-12-02 ENCOUNTER — Encounter: Payer: Self-pay | Admitting: Nurse Practitioner

## 2022-12-15 ENCOUNTER — Ambulatory Visit (INDEPENDENT_AMBULATORY_CARE_PROVIDER_SITE_OTHER): Payer: Medicare HMO

## 2022-12-15 DIAGNOSIS — E538 Deficiency of other specified B group vitamins: Secondary | ICD-10-CM

## 2022-12-15 MED ORDER — CYANOCOBALAMIN 1000 MCG/ML IJ SOLN
1000.0000 ug | Freq: Once | INTRAMUSCULAR | Status: AC
Start: 2022-12-15 — End: 2022-12-15
  Administered 2022-12-15: 1000 ug via INTRAMUSCULAR

## 2022-12-28 ENCOUNTER — Encounter: Payer: Self-pay | Admitting: Nurse Practitioner

## 2022-12-28 ENCOUNTER — Ambulatory Visit (INDEPENDENT_AMBULATORY_CARE_PROVIDER_SITE_OTHER): Payer: Medicare HMO | Admitting: Nurse Practitioner

## 2022-12-28 VITALS — BP 135/75 | HR 70 | Temp 98.3°F | Resp 16 | Ht 64.0 in | Wt 133.0 lb

## 2022-12-28 DIAGNOSIS — F331 Major depressive disorder, recurrent, moderate: Secondary | ICD-10-CM

## 2022-12-28 DIAGNOSIS — I10 Essential (primary) hypertension: Secondary | ICD-10-CM

## 2022-12-28 DIAGNOSIS — G8929 Other chronic pain: Secondary | ICD-10-CM | POA: Diagnosis not present

## 2022-12-28 DIAGNOSIS — M79672 Pain in left foot: Secondary | ICD-10-CM | POA: Diagnosis not present

## 2022-12-28 DIAGNOSIS — F411 Generalized anxiety disorder: Secondary | ICD-10-CM | POA: Diagnosis not present

## 2022-12-28 DIAGNOSIS — K581 Irritable bowel syndrome with constipation: Secondary | ICD-10-CM

## 2022-12-28 MED ORDER — MAGNESIUM OXIDE 400 MG PO TABS
400.0000 mg | ORAL_TABLET | Freq: Every day | ORAL | 3 refills | Status: DC
Start: 2022-12-28 — End: 2023-10-26

## 2022-12-28 NOTE — Progress Notes (Signed)
[facility] [address] [place], [place] [address]  Internal MEDICINE  Office Visit Note  Patient Name: Kristen Pennington  [id number]  [id number]  Date of Service: 12/28/2022  Chief Complaint  Patient presents with   Depression   Gastroesophageal Reflux   Hypertension   Hyperlipidemia   Follow-up    HPI Kristen Pennington presents for a follow-up visit for Started using new topical OTC med for musculoskeletal pain and it is causing itching and a blistering rash. It is a topical magnesium sulfate foam. Vaseline helps as well as hydrocortisone cream.  Doing well on escitalopram 10 mg daily  Add back magnesium oxide Spot on foot still bothering her, might need to see foot doctor.     Current Medication: Outpatient Encounter Medications as of 12/28/2022  Medication Sig   acetaminophen (TYLENOL) 500 MG tablet Take 500 mg by mouth every 6 (six) hours as needed.   bisoprolol-hydrochlorothiazide (ZIAC) 5-6.25 MG tablet Take 1 tablet by mouth daily.   Cholecalciferol (VITAMIN D3) 5000 units TABS Take 5,000 Units by mouth daily.   cyclobenzaprine (FLEXERIL) 10 MG tablet Take 1 tablet (10 mg total) by mouth at bedtime as needed for muscle spasms.   escitalopram (LEXAPRO) 10 MG tablet Take 1 tablet (10 mg total) by mouth daily.   estradiol (ESTRACE) 0.1 MG/GM vaginal cream USE ONCE WEEKLY AS NEEDED (Patient taking differently: 1 Applicatorful. USE ONCE WEEKLY AS NEEDED)   fluticasone (FLONASE) 50 MCG/ACT nasal spray 1 SPRAY IN EACH NOSTRIL ONCE DAILY AS NEEDED (Patient taking differently: Place 1 spray into both nostrils daily. 1 SPRAY IN EACH NOSTRIL ONCE DAILY AS NEEDED)   furosemide (LASIX) 20 MG tablet Take 1 tablet (20 mg total) by mouth daily.   Liniments (SALONPAS PAIN RELIEF PATCH EX) Apply 1 patch topically 2 (two) times daily.   Multiple Vitamins-Minerals (CENTRUM SILVER 50+WOMEN) TABS Take by mouth.   Polyethyl Glycol-Propyl Glycol (SYSTANE) 0.4-0.3 % SOLN Apply to eye.    polyethylene glycol powder (GLYCOLAX/MIRALAX) 17 GM/SCOOP powder Take 17 g by mouth daily as needed for mild constipation or moderate constipation.   promethazine (PHENERGAN) 12.5 MG tablet Take 1 tablet (12.5 mg total) by mouth every 6 (six) hours as needed for nausea or vomiting.   rOPINIRole (REQUIP) 1 MG tablet Take 1 tablet by mouth in am and 2 tablets by mouth at bedtime   traMADol (ULTRAM) 50 MG tablet One tab 3 a day fpr pain   traZODone (DESYREL) 50 MG tablet Take 0.5-1 tablets (25-50 mg total) by mouth at bedtime.   [DISCONTINUED] magnesium oxide (MAG-OX) 400 MG tablet Take 1 tablet (400 mg total) by mouth daily as needed (sleep and constipation). At bedtime   magnesium oxide (MAG-OX) 400 MG tablet Take 1 tablet (400 mg total) by mouth at bedtime. At bedtime   No facility-administered encounter medications on file as of 12/28/2022.    Surgical History: Past Surgical History:  Procedure Laterality Date   COLONOSCOPY  05-03-15   COLONOSCOPY WITH PROPOFOL N/A 05/03/2015   Procedure: COLONOSCOPY WITH PROPOFOL;  Surgeon: Midge Minium, MD;  Location: [facility];  Service: Endoscopy;  Laterality: N/A;  CPAP   DILATION AND CURETTAGE OF UTERUS  1970   ENDOMETRIAL BIOPSY     EYE SURGERY Right 2017   POLYPECTOMY  05/03/2015   Procedure: POLYPECTOMY;  Surgeon: Midge Minium, MD;  Location: [facility];  Service: Endoscopy;;   ROTATOR CUFF REPAIR  2007   SPINE SURGERY      Medical  History: Past Medical History:  Diagnosis Date   Arthritis    Atrophic vaginitis 03/06/2013   Last Assessment & Plan:  She will plan to continue estradiol vaginal cream.  Prescription was rewritten stipulating use of the generic product.    Atypical migraine 03/06/2013   Last Assessment & Plan:  Since she rarely takes sumatriptan, we will discontinue it. Continue sparing use of OTC migraine products.    Avitaminosis D 03/06/2013   Last Assessment & Plan:  Recheck vitamin D level. Plan to continue  vitamin D supplementation.    Ceratitis 08/01/2013   Last Assessment & Plan:  Because of the expense, she will discontinue Restasis. She will use over-the-counter moisturizing eyedrops and liquid gel.    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    Cystocele, midline 03/06/2013   Demoralization and apathy 03/06/2013   Last Assessment & Plan:  Since she has been taking bupropion only once a day, I will write the prescription with the correct instructions and correct quantity. She has done well on this for years and she is encouraged to take it regularly    Depression    Environmental allergies    Epistaxis    Essential (primary) hypertension 05/17/2013   Last Assessment & Plan:  Her blood pressure is well-controlled. We will plan to continue hydrochlorothiazide and benazepril.  Both are generic and should be affordable on her health plan.    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 We will continue that regimen.    Sinusitis    Sleep apnea    CPAP    Family History: Family History  Problem Relation Age of Onset   Arthritis Mother    Alzheimer's disease Father    Heart disease Sister    Cancer Brother    Ovarian cancer Neg Hx    Breast cancer Neg Hx    Colon cancer Neg Hx    Diabetes Neg Hx     Social History   Socioeconomic History   Marital status: Widowed    Spouse name: Not on file   Number of children: 2   Years of education: 92   Highest education level: Some college, no degree  Occupational History   Not on file  Tobacco Use    Smoking status: Never    Passive exposure: Never   Smokeless tobacco: Never  Vaping Use   Vaping Use: Never used  Substance and Sexual Activity   Alcohol use: No    Alcohol/week: 0.0 standard drinks of alcohol   Drug use: No   Sexual activity: Not Currently  Other Topics Concern   Not on file  Social History Narrative   Not on file   Social Determinants of Health   Financial Resource Strain: Low Risk  (12/31/2021)   Overall Financial Resource Strain (CARDIA)    Difficulty of Paying Living Expenses: Not hard at all  Food Insecurity: No Food Insecurity (12/31/2021)   Hunger Vital Sign    Worried About Running Out of Food in the Last Year: Never true  Ran Out of Food in the Last Year: Never true  Transportation Needs: No Transportation Needs (12/31/2021)   PRAPARE - Administrator, Civil Service (Medical): No    Lack of Transportation (Non-Medical): No  Physical Activity: Inactive (12/31/2021)   Exercise Vital Sign    Days of Exercise per Week: 0 days    Minutes of Exercise per Session: 0 min  Stress: Stress Concern Present (12/31/2021)   [employer] of Occupational Health - Occupational Stress Questionnaire    Feeling of Stress : To some extent  Social Connections: Moderately Isolated (12/31/2021)   Social Connection and Isolation Panel [NHANES]    Frequency of Communication with Friends and Family: More than three times a week    Frequency of Social Gatherings with Friends and Family: More than three times a week    Attends Religious Services: More than 4 times per year    Active Member of [employer] or Organizations: No    Attends Banker Meetings: Never    Marital Status: Widowed  Intimate Partner Violence: Not At Risk (12/31/2021)   Humiliation, Afraid, Rape, and Kick questionnaire    Fear of Current or Ex-Partner: No    Emotionally Abused: No    Physically Abused: No    Sexually Abused: No      Review of Systems  Constitutional:  Positive  for fatigue. Negative for chills and unexpected weight change.  HENT:  Positive for postnasal drip. Negative for congestion, rhinorrhea, sneezing and sore throat.   Eyes:  Negative for redness.  Respiratory: Negative.  Negative for cough, chest tightness, shortness of breath and wheezing.   Cardiovascular: Negative.  Negative for chest pain and palpitations.  Gastrointestinal: Negative.  Negative for abdominal pain, constipation, diarrhea, nausea and vomiting.  Genitourinary:  Negative for dysuria and frequency.  Musculoskeletal:  Positive for arthralgias. Negative for back pain, joint swelling and neck pain.  Skin:  Negative for rash.  Neurological: Negative.  Negative for tremors and numbness.  Hematological:  Negative for adenopathy. Does not bruise/bleed easily.  Psychiatric/Behavioral:  Positive for behavioral problems (Depression) and sleep disturbance. Negative for self-injury and suicidal ideas. The patient is nervous/anxious.        Feeling lonely    Vital Signs: BP (!) 140/78   Pulse 70   Temp 98.3 F (36.8 C)   Resp 16   Ht 5\' 4"  (1.626 m)   Wt 133 lb (60.3 kg)   SpO2 93%   BMI 22.83 kg/m    Physical Exam Vitals reviewed.  Constitutional:      General: She is not in acute distress.    Appearance: Normal appearance. She is normal weight. She is not ill-appearing.  HENT:     Head: Normocephalic and atraumatic.  Eyes:     Pupils: Pupils are equal, round, and reactive to light.  Cardiovascular:     Rate and Rhythm: Normal rate and regular rhythm.  Pulmonary:     Effort: Pulmonary effort is normal. No respiratory distress.  Neurological:     Mental Status: She is alert and oriented to person, place, and time.  Psychiatric:        Mood and Affect: Mood normal.        Behavior: Behavior normal.        Assessment/Plan: 1. Essential (primary) hypertension Continue medications as prescribed.   2. Irritable bowel syndrome with constipation Continue magnesium  oxide as prescribed  - magnesium oxide (MAG-OX) 400 MG tablet; Take 1 tablet (400  mg total) by mouth at bedtime. At bedtime  Dispense: 90 tablet; Refill: 3  3. Chronic primary generalized pain Continue tramadol as prescribed   4. Left foot pain Consider podiatry consult, patient declined for now   5. Generalized anxiety disorder Continue escitalopram as prescribed   6. Moderate episode of recurrent major depressive disorder (HCC) Continue escitalopram as prescribed    General Counseling: angelmarie neighbors understanding of the findings of todays visit and agrees with plan of treatment. I have discussed any further diagnostic evaluation that may be needed or ordered today. We also reviewed her medications today. she has been encouraged to call the office with any questions or concerns that should arise related to todays visit.    No orders of the defined types were placed in this encounter.   Meds ordered this encounter  Medications   magnesium oxide (MAG-OX) 400 MG tablet    Sig: Take 1 tablet (400 mg total) by mouth at bedtime. At bedtime    Dispense:  90 tablet    Refill:  3    Please add magnesium oxide back on at bedtime    Return in about 3 months (around 03/30/2023) for F/U, BP check, Leanor Voris PCP.   Total time spent:30 Minutes Time spent includes review of chart, medications, test results, and follow up plan with the patient.    Controlled Substance Database was reviewed by me.  This patient was seen by Sallyanne Kuster, FNP-C in collaboration with Dr. Beverely Risen as a part of collaborative care agreement.   Alyscia Carmon R. Tedd Sias, MSN, FNP-C Internal medicine

## 2023-01-04 ENCOUNTER — Telehealth: Payer: Self-pay

## 2023-01-04 ENCOUNTER — Other Ambulatory Visit: Payer: Self-pay | Admitting: Nurse Practitioner

## 2023-01-04 MED ORDER — TRIAMCINOLONE ACETONIDE 0.1 % EX CREA: 1.0000 | TOPICAL_CREAM | Freq: Two times a day (BID) | CUTANEOUS | 1 refills | Status: DC

## 2023-01-04 NOTE — Telephone Encounter (Signed)
Patient called stating she is itching and still has the rash wants something to help with it. Spoke with alyssa and she send in something for her. Patient notified.

## 2023-01-06 ENCOUNTER — Other Ambulatory Visit: Payer: Self-pay | Admitting: Internal Medicine

## 2023-01-06 NOTE — Telephone Encounter (Signed)
OK TO SEND PLEASE REVIEW

## 2023-01-12 ENCOUNTER — Ambulatory Visit: Payer: Medicare HMO

## 2023-01-12 ENCOUNTER — Ambulatory Visit (INDEPENDENT_AMBULATORY_CARE_PROVIDER_SITE_OTHER): Payer: Medicare HMO

## 2023-01-12 DIAGNOSIS — E538 Deficiency of other specified B group vitamins: Secondary | ICD-10-CM | POA: Diagnosis not present

## 2023-01-12 MED ORDER — CYANOCOBALAMIN 1000 MCG/ML IJ SOLN
1000.0000 ug | Freq: Once | INTRAMUSCULAR | Status: AC
Start: 2023-01-12 — End: 2023-01-12
  Administered 2023-01-12: 1000 ug via INTRAMUSCULAR

## 2023-02-08 ENCOUNTER — Other Ambulatory Visit: Payer: Self-pay | Admitting: Physician Assistant

## 2023-02-08 DIAGNOSIS — I10 Essential (primary) hypertension: Secondary | ICD-10-CM

## 2023-02-08 MED ORDER — HYDRALAZINE HCL 10 MG PO TABS
10.0000 mg | ORAL_TABLET | Freq: Every day | ORAL | 0 refills | Status: DC | PRN
Start: 2023-02-08 — End: 2023-04-05

## 2023-02-09 ENCOUNTER — Telehealth: Payer: Self-pay | Admitting: Nurse Practitioner

## 2023-02-09 NOTE — Telephone Encounter (Signed)
Pt called stating that she is doing a little bit then yesterday she just received her medication and she wanted to notify the office-nm

## 2023-02-16 ENCOUNTER — Ambulatory Visit: Payer: Medicare HMO

## 2023-02-18 ENCOUNTER — Encounter: Payer: Self-pay | Admitting: Nurse Practitioner

## 2023-02-18 ENCOUNTER — Ambulatory Visit (INDEPENDENT_AMBULATORY_CARE_PROVIDER_SITE_OTHER): Payer: Medicare HMO | Admitting: Nurse Practitioner

## 2023-02-18 VITALS — BP 135/78 | HR 68 | Temp 98.2°F | Resp 16 | Ht 64.0 in | Wt 134.8 lb

## 2023-02-18 DIAGNOSIS — I10 Essential (primary) hypertension: Secondary | ICD-10-CM | POA: Diagnosis not present

## 2023-02-18 DIAGNOSIS — E538 Deficiency of other specified B group vitamins: Secondary | ICD-10-CM | POA: Diagnosis not present

## 2023-02-18 DIAGNOSIS — K581 Irritable bowel syndrome with constipation: Secondary | ICD-10-CM

## 2023-02-18 DIAGNOSIS — G8929 Other chronic pain: Secondary | ICD-10-CM

## 2023-02-18 MED ORDER — TRAMADOL HCL 50 MG PO TABS
50.0000 mg | ORAL_TABLET | Freq: Three times a day (TID) | ORAL | 2 refills | Status: DC
Start: 2023-02-18 — End: 2023-05-11

## 2023-02-18 MED ORDER — CYANOCOBALAMIN 1000 MCG/ML IJ SOLN
1000.0000 ug | Freq: Once | INTRAMUSCULAR | Status: AC
Start: 2023-02-18 — End: 2023-02-18
  Administered 2023-02-18: 1000 ug via INTRAMUSCULAR

## 2023-02-18 NOTE — Progress Notes (Signed)
[facility] [address] [place], [place] [address]  Internal MEDICINE  Office Visit Note  Patient Name: Kristen Pennington  [id number]  [id number]  Date of Service: 02/18/2023  Chief Complaint  Patient presents with   Acute Visit    dizzy     HPI Kristen Pennington presents for an acute sick visit for dizziness and headache. Dizziness -- comes and goes. Teeth -- Caps came off a couple of front teeth -- expensive to fix, tried to cement it back in which did not work.  Head pain -- headache on the left side. Sometimes  Tramadol -- wants to decrease to 3 times a day.  Hard of hearing -- worsening Vision -- macular degeneration, worsening Constipation -- eating prunes 5-9 every morning which is preventing constipation.  Blood pressure is elevated, rechecked. And BP improved.      Current Medication:  Outpatient Encounter Medications as of 02/18/2023  Medication Sig   acetaminophen (TYLENOL) 500 MG tablet Take 500 mg by mouth every 6 (six) hours as needed.   bisoprolol-hydrochlorothiazide (ZIAC) 5-6.25 MG tablet Take 1 tablet by mouth daily.   Cholecalciferol (VITAMIN D3) 5000 units TABS Take 5,000 Units by mouth daily.   cyclobenzaprine (FLEXERIL) 10 MG tablet Take 1 tablet (10 mg total) by mouth at bedtime as needed for muscle spasms.   estradiol (ESTRACE) 0.1 MG/GM vaginal cream USE ONE APPLICATORFUL AS DIRECTED ONCE WEEKLY AS NEEDED   fluticasone (FLONASE) 50 MCG/ACT nasal spray 1 SPRAY IN EACH NOSTRIL ONCE DAILY AS NEEDED (Patient taking differently: Place 1 spray into both nostrils daily. 1 SPRAY IN EACH NOSTRIL ONCE DAILY AS NEEDED)   hydrALAZINE (APRESOLINE) 10 MG tablet Take 1 tablet (10 mg total) by mouth daily as needed. For high BP.   Liniments (SALONPAS PAIN RELIEF PATCH EX) Apply 1 patch topically 2 (two) times daily.   magnesium oxide (MAG-OX) 400 MG tablet Take 1 tablet (400 mg total) by mouth at bedtime. At bedtime   Multiple Vitamins-Minerals (CENTRUM SILVER  50+WOMEN) TABS Take by mouth.   Polyethyl Glycol-Propyl Glycol (SYSTANE) 0.4-0.3 % SOLN Apply to eye.   polyethylene glycol powder (GLYCOLAX/MIRALAX) 17 GM/SCOOP powder Take 17 g by mouth daily as needed for mild constipation or moderate constipation.   promethazine (PHENERGAN) 12.5 MG tablet Take 1 tablet (12.5 mg total) by mouth every 6 (six) hours as needed for nausea or vomiting.   rOPINIRole (REQUIP) 1 MG tablet Take 1 tablet by mouth in am and 2 tablets by mouth at bedtime   triamcinolone cream (KENALOG) 0.1 % Apply 1 Application topically 2 (two) times daily. To rash area until resolved.   [DISCONTINUED] escitalopram (LEXAPRO) 10 MG tablet Take 1 tablet (10 mg total) by mouth daily.   [DISCONTINUED] furosemide (LASIX) 20 MG tablet Take 1 tablet (20 mg total) by mouth daily.   [DISCONTINUED] traMADol (ULTRAM) 50 MG tablet One tab 3 a day fpr pain   [DISCONTINUED] traZODone (DESYREL) 50 MG tablet Take 0.5-1 tablets (25-50 mg total) by mouth at bedtime.   traMADol (ULTRAM) 50 MG tablet Take 1 tablet (50 mg total) by mouth in the morning, at noon, and at bedtime.   [EXPIRED] cyanocobalamin (VITAMIN B12) injection 1,000 mcg    No facility-administered encounter medications on file as of 02/18/2023.      Medical History: Past Medical History:  Diagnosis Date   Arthritis    Atrophic vaginitis 03/06/2013   Last Assessment & Plan:  She will plan to continue estradiol vaginal cream.  Prescription was rewritten stipulating use of the generic product.    Atypical migraine 03/06/2013   Last Assessment & Plan:  Since she rarely takes sumatriptan, we will discontinue it. Continue sparing use of OTC migraine products.    Avitaminosis D 03/06/2013   Last Assessment & Plan:  Recheck vitamin D level. Plan to continue vitamin D supplementation.    Ceratitis 08/01/2013   Last Assessment & Plan:  Because of the expense, she will discontinue Restasis. She will use over-the-counter moisturizing eyedrops and  liquid gel.    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    Cystocele, midline 03/06/2013   Demoralization and apathy 03/06/2013   Last Assessment & Plan:  Since she has been taking bupropion only once a day, I will write the prescription with the correct instructions and correct quantity. She has done well on this for years and she is encouraged to take it regularly    Depression    Environmental allergies    Epistaxis    Essential (primary) hypertension 05/17/2013   Last Assessment & Plan:  Her blood pressure is well-controlled. We will plan to continue hydrochlorothiazide and benazepril.  Both are generic and should be affordable on her health plan.    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 We will continue that regimen.    Sinusitis    Sleep apnea    CPAP     Vital Signs: BP 135/78 Comment: 150/74  Pulse 68   Temp 98.2 F (36.8 C)   Resp 16   Ht 5\' 4"  (1.626 m)   Wt 134 lb 12.8 oz (61.1 kg)   SpO2 94%   BMI 23.14 kg/m    Review of Systems  Constitutional:  Positive for fatigue.  HENT:  Positive for dental problem and hearing loss. Negative for postnasal drip, rhinorrhea, sinus pressure and sinus pain.   Eyes:  Positive for visual disturbance.  Respiratory:  Negative for cough, chest tightness, shortness of breath and wheezing.   Cardiovascular: Negative.  Negative for chest pain and palpitations.  Gastrointestinal:  Positive for constipation. Negative for abdominal distention, abdominal pain, diarrhea, nausea and vomiting.   Musculoskeletal:  Positive for arthralgias and back pain.  Neurological:  Positive for dizziness, weakness and headaches.    Physical Exam Vitals reviewed.  Constitutional:      General: She is not in acute distress.    Appearance: Normal appearance. She is normal weight. She is not ill-appearing.  HENT:     Head: Normocephalic and atraumatic.  Eyes:     Pupils: Pupils are equal, round, and reactive to light.  Cardiovascular:     Rate and Rhythm: Normal rate and regular rhythm.  Pulmonary:     Effort: Pulmonary effort is normal. No respiratory distress.  Neurological:     Mental Status: She is alert and oriented to person, place, and time.     Coordination: Coordination normal.  Psychiatric:        Mood and Affect: Mood normal.        Behavior: Behavior normal.  Assessment/Plan: 1. Chronic primary generalized pain Decreased frequency of tramadol to 3 times daily as requested by patient.  - traMADol (ULTRAM) 50 MG tablet; Take 1 tablet (50 mg total) by mouth in the morning, at noon, and at bedtime.  Dispense: 90 tablet; Refill: 2  2. Essential (primary) hypertension Stable, continue medications as prescribed.   3. Irritable bowel syndrome with constipation May continue to eat prunes if they are helping with her constipation  4. B12 deficiency B12 injection administered in office today.  - cyanocobalamin (VITAMIN B12) injection 1,000 mcg   General Counseling: nabria walas understanding of the findings of todays visit and agrees with plan of treatment. I have discussed any further diagnostic evaluation that may be needed or ordered today. We also reviewed her medications today. she has been encouraged to call the office with any questions or concerns that should arise related to todays visit.    Counseling:    No orders of the defined types were placed in this encounter.   Meds ordered this encounter  Medications   traMADol (ULTRAM) 50 MG tablet     Sig: Take 1 tablet (50 mg total) by mouth in the morning, at noon, and at bedtime.    Dispense:  90 tablet    Refill:  2    Note frequency is only 3 times daily, please decrease from 4 times daily to 3 times daily. Asap with next pill pack delivery.   cyanocobalamin (VITAMIN B12) injection 1,000 mcg    Return in about 1 month (around 03/21/2023) for F/U, Schwanda Zima PCP.  [place] Controlled Substance Database was reviewed by me for overdose risk score (ORS)  Time spent:30 Minutes Time spent with patient included reviewing progress notes, labs, imaging studies, and discussing plan for follow up.   This patient was seen by Sallyanne Kuster, FNP-C in collaboration with Dr. Beverely Risen as a part of collaborative care agreement.  Jac Romulus R. Tedd Sias, MSN, FNP-C Internal Medicine

## 2023-03-01 ENCOUNTER — Other Ambulatory Visit: Payer: Self-pay | Admitting: Nurse Practitioner

## 2023-03-01 DIAGNOSIS — F331 Major depressive disorder, recurrent, moderate: Secondary | ICD-10-CM

## 2023-03-01 DIAGNOSIS — F411 Generalized anxiety disorder: Secondary | ICD-10-CM

## 2023-03-01 NOTE — Telephone Encounter (Signed)
Please review and send 

## 2023-03-02 ENCOUNTER — Other Ambulatory Visit: Payer: Self-pay

## 2023-03-02 ENCOUNTER — Emergency Department
Admission: EM | Admit: 2023-03-02 | Discharge: 2023-03-03 | Disposition: A | Discharge status: Skilled Nursing Facility | Payer: Medicare HMO | Attending: Emergency Medicine | Admitting: Emergency Medicine

## 2023-03-02 ENCOUNTER — Emergency Department: Payer: Medicare HMO

## 2023-03-02 DIAGNOSIS — J811 Chronic pulmonary edema: Secondary | ICD-10-CM | POA: Diagnosis not present

## 2023-03-02 DIAGNOSIS — I509 Heart failure, unspecified: Secondary | ICD-10-CM | POA: Diagnosis not present

## 2023-03-02 DIAGNOSIS — R519 Headache, unspecified: Secondary | ICD-10-CM

## 2023-03-02 DIAGNOSIS — I110 Hypertensive heart disease with heart failure: Secondary | ICD-10-CM | POA: Diagnosis not present

## 2023-03-02 DIAGNOSIS — J439 Emphysema, unspecified: Secondary | ICD-10-CM | POA: Diagnosis not present

## 2023-03-02 DIAGNOSIS — Z79899 Other long term (current) drug therapy: Secondary | ICD-10-CM | POA: Diagnosis not present

## 2023-03-02 DIAGNOSIS — R0602 Shortness of breath: Secondary | ICD-10-CM | POA: Diagnosis not present

## 2023-03-02 DIAGNOSIS — Z20822 Contact with and (suspected) exposure to covid-19: Secondary | ICD-10-CM | POA: Insufficient documentation

## 2023-03-02 DIAGNOSIS — J90 Pleural effusion, not elsewhere classified: Secondary | ICD-10-CM | POA: Diagnosis not present

## 2023-03-02 DIAGNOSIS — Z743 Need for continuous supervision: Secondary | ICD-10-CM | POA: Diagnosis not present

## 2023-03-02 DIAGNOSIS — I10 Essential (primary) hypertension: Secondary | ICD-10-CM | POA: Diagnosis not present

## 2023-03-02 LAB — RESP PANEL BY RT-PCR (RSV, FLU A&B, COVID)  RVPGX2
Influenza A by PCR: NEGATIVE
Influenza B by PCR: NEGATIVE
Resp Syncytial Virus by PCR: NEGATIVE
SARS Coronavirus 2 by RT PCR: NEGATIVE

## 2023-03-02 NOTE — ED Notes (Signed)
First Nurse Note: Patient to ED via [facility] from [facility] for headache. Pt states she took tylenol for the headache with no relief.

## 2023-03-02 NOTE — ED Triage Notes (Signed)
Pt reports nausea and headache since yesterday. Reports took Excedrin at home prior to arrival and headache is improved. Denies fall or head injury. Pt alert and oriented on arrival. BIB [facility] from [facility]. Pt denies chest pain/pressure or SOB. Denies dizziness, vision change, parasthesia. Breathing unlabored speaking in full sentences. Symmetric chest rise and fall.

## 2023-03-03 ENCOUNTER — Emergency Department: Payer: Medicare HMO

## 2023-03-03 DIAGNOSIS — J90 Pleural effusion, not elsewhere classified: Secondary | ICD-10-CM | POA: Diagnosis not present

## 2023-03-03 DIAGNOSIS — J439 Emphysema, unspecified: Secondary | ICD-10-CM | POA: Diagnosis not present

## 2023-03-03 DIAGNOSIS — J811 Chronic pulmonary edema: Secondary | ICD-10-CM | POA: Diagnosis not present

## 2023-03-03 DIAGNOSIS — R519 Headache, unspecified: Secondary | ICD-10-CM | POA: Diagnosis not present

## 2023-03-03 LAB — URINALYSIS, ROUTINE W REFLEX MICROSCOPIC
Bilirubin Urine: NEGATIVE
Glucose, UA: NEGATIVE mg/dL
Hgb urine dipstick: NEGATIVE
Ketones, ur: NEGATIVE mg/dL
Nitrite: NEGATIVE
Protein, ur: 100 mg/dL — AB
Specific Gravity, Urine: 1.02 (ref 1.005–1.030)
pH: 6 (ref 5.0–8.0)

## 2023-03-03 LAB — CBC WITH DIFFERENTIAL/PLATELET
Abs Immature Granulocytes: 0.06 10*3/uL (ref 0.00–0.07)
Basophils Absolute: 0.1 10*3/uL (ref 0.0–0.1)
Basophils Relative: 0 %
Eosinophils Absolute: 0 10*3/uL (ref 0.0–0.5)
Eosinophils Relative: 0 %
HCT: 40.4 % (ref 36.0–46.0)
Hemoglobin: 13.6 g/dL (ref 12.0–15.0)
Immature Granulocytes: 0 %
Lymphocytes Relative: 3 %
Lymphs Abs: 0.6 10*3/uL — ABNORMAL LOW (ref 0.7–4.0)
MCH: 31.6 pg (ref 26.0–34.0)
MCHC: 33.7 g/dL (ref 30.0–36.0)
MCV: 94 fL (ref 80.0–100.0)
Monocytes Absolute: 0.7 10*3/uL (ref 0.1–1.0)
Monocytes Relative: 4 %
Neutro Abs: 17.5 10*3/uL — ABNORMAL HIGH (ref 1.7–7.7)
Neutrophils Relative %: 93 %
Platelets: 225 10*3/uL (ref 150–400)
RBC: 4.3 MIL/uL (ref 3.87–5.11)
RDW: 13 % (ref 11.5–15.5)
WBC: 18.9 10*3/uL — ABNORMAL HIGH (ref 4.0–10.5)
nRBC: 0 % (ref 0.0–0.2)

## 2023-03-03 LAB — COMPREHENSIVE METABOLIC PANEL
ALT: 24 U/L (ref 0–44)
AST: 37 U/L (ref 15–41)
Albumin: 4.1 g/dL (ref 3.5–5.0)
Alkaline Phosphatase: 56 U/L (ref 38–126)
Anion gap: 10 (ref 5–15)
BUN: 33 mg/dL — ABNORMAL HIGH (ref 8–23)
CO2: 23 mmol/L (ref 22–32)
Calcium: 9 mg/dL (ref 8.9–10.3)
Chloride: 101 mmol/L (ref 98–111)
Creatinine, Ser: 1.46 mg/dL — ABNORMAL HIGH (ref 0.44–1.00)
GFR, Estimated: 34 mL/min — ABNORMAL LOW (ref >60)
Glucose, Bld: 124 mg/dL — ABNORMAL HIGH (ref 70–99)
Potassium: 4.1 mmol/L (ref 3.5–5.1)
Sodium: 134 mmol/L — ABNORMAL LOW (ref 135–145)
Total Bilirubin: 1.4 mg/dL — ABNORMAL HIGH (ref 0.3–1.2)
Total Protein: 7.5 g/dL (ref 6.5–8.1)

## 2023-03-03 LAB — BRAIN NATRIURETIC PEPTIDE: B Natriuretic Peptide: 407.3 pg/mL — ABNORMAL HIGH (ref 0.0–100.0)

## 2023-03-03 LAB — TROPONIN I (HIGH SENSITIVITY): Troponin I (High Sensitivity): 8 ng/L (ref <18)

## 2023-03-03 MED ORDER — ACETAMINOPHEN 500 MG PO TABS
1000.0000 mg | ORAL_TABLET | Freq: Once | ORAL | Status: AC
  Administered 2023-03-03: 1000 mg via ORAL
  Filled 2023-03-03: qty 2

## 2023-03-03 MED ORDER — TRAMADOL HCL 50 MG PO TABS
50.0000 mg | ORAL_TABLET | Freq: Once | ORAL | Status: AC
  Administered 2023-03-03: 50 mg via ORAL
  Filled 2023-03-03: qty 1

## 2023-03-03 MED ORDER — FUROSEMIDE 10 MG/ML IJ SOLN
40.0000 mg | Freq: Once | INTRAMUSCULAR | Status: AC
  Administered 2023-03-03: 40 mg via INTRAMUSCULAR
  Filled 2023-03-03: qty 4

## 2023-03-03 MED ORDER — ROPINIROLE HCL 1 MG PO TABS
2.0000 mg | ORAL_TABLET | Freq: Once | ORAL | Status: AC
  Administered 2023-03-03: 2 mg via ORAL
  Filled 2023-03-03: qty 2

## 2023-03-03 NOTE — ED Notes (Signed)
called to [facility] for transport to facility/[facility]/rep:blanca.

## 2023-03-03 NOTE — Discharge Instructions (Signed)
Your lab work today showed an elevated white blood cell count which is nonspecific.  You had no fever here in the emergency department.  Your head CT was normal.  Your urine showed no infection.  Your chest x-ray showed a small amount of pulmonary edema but no pneumonia.  We have given you a dose of Lasix here to help you get rid of this fluid and recommended she take your Lasix at home as prescribed.  You may take over-the-counter Tylenol 1000 mg every 6 hours as needed for headache.  Please follow-up with your primary care provider.

## 2023-03-03 NOTE — ED Notes (Signed)
Ambulatory sat 95%

## 2023-03-03 NOTE — ED Notes (Signed)
Pt provided with water for PO challenge per orders from MD. Pt tolerating so far.

## 2023-03-03 NOTE — ED Provider Notes (Signed)
[facility] Provider Note    Event Date/Time   First MD Initiated Contact with Patient 03/02/23 2346     (approximate)   History   Nausea   HPI  Kristen Pennington is a 89 y.o. female with history of hypertension, hyperlipidemia who presents emergency department with complaints of generalized headache.  States she has had some nausea today and has been coughing up phlegm.  Reports subjective fever stating that she felt hot earlier.  She states she thinks she has a headache because she did not take her daily tramadol today.  She denies any vomiting, diarrhea.  States occasionally she does have some dysuria.  No chest pain or shortness of breath.  She denies head injury.  No numbness, tingling or weakness.   History provided by patient.    Past Medical History:  Diagnosis Date   Arthritis    Atrophic vaginitis 03/06/2013   Last Assessment & Plan:  She will plan to continue estradiol vaginal cream.  Prescription was rewritten stipulating use of the generic product.    Atypical migraine 03/06/2013   Last Assessment & Plan:  Since she rarely takes sumatriptan, we will discontinue it. Continue sparing use of OTC migraine products.    Avitaminosis D 03/06/2013   Last Assessment & Plan:  Recheck vitamin D level. Plan to continue vitamin D supplementation.    Ceratitis 08/01/2013   Last Assessment & Plan:  Because of the expense, she will discontinue Restasis. She will use over-the-counter moisturizing eyedrops and liquid gel.    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    Cystocele, midline 03/06/2013   Demoralization and apathy 03/06/2013   Last Assessment & Plan:  Since she has been taking bupropion only once a day, I will  write the prescription with the correct instructions and correct quantity. She has done well on this for years and she is encouraged to take it regularly    Depression    Environmental allergies    Epistaxis    Essential (primary) hypertension 05/17/2013   Last Assessment & Plan:  Her blood pressure is well-controlled. We will plan to continue hydrochlorothiazide and benazepril.  Both are generic and should be affordable on her health plan.    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 We will continue that regimen.    Sinusitis    Sleep apnea    CPAP    Past Surgical History:  Procedure Laterality Date   COLONOSCOPY  05-03-15   COLONOSCOPY WITH PROPOFOL N/A 05/03/2015   Procedure: COLONOSCOPY WITH PROPOFOL;  Surgeon: Midge Minium, MD;  Location: [facility];  Service: Endoscopy;  Laterality: N/A;  CPAP   DILATION AND CURETTAGE OF UTERUS  1970   ENDOMETRIAL BIOPSY     EYE SURGERY Right 2017   POLYPECTOMY  05/03/2015   Procedure: POLYPECTOMY;  Surgeon: Midge Minium, MD;  Location: [facility];  Service: Endoscopy;;   ROTATOR CUFF REPAIR  2007   SPINE SURGERY      MEDICATIONS:  Prior to Admission medications  Medication Sig Start Date End Date Taking? Authorizing Provider  acetaminophen (TYLENOL) 500 MG tablet Take 500 mg by mouth every 6 (six) hours as needed.    [provider]  bisoprolol-hydrochlorothiazide (ZIAC) 5-6.25 MG tablet Take 1 tablet by mouth daily. 11/30/22   Sallyanne Kuster, NP  Cholecalciferol (VITAMIN D3) 5000 units TABS Take 5,000 Units by mouth daily.    [provider]  cyclobenzaprine (FLEXERIL) 10 MG tablet Take 1 tablet (10 mg total) by mouth at bedtime as needed for muscle spasms. 11/30/22   Sallyanne Kuster, NP   escitalopram (LEXAPRO) 10 MG tablet TAKE 1 TABLET BY MOUTH DAILY 03/02/23   Sallyanne Kuster, NP  estradiol (ESTRACE) 0.1 MG/GM vaginal cream USE ONE APPLICATORFUL AS DIRECTED ONCE WEEKLY AS NEEDED 01/06/23   Abernathy, Arlyss Repress, NP  fluticasone (FLONASE) 50 MCG/ACT nasal spray 1 SPRAY IN EACH NOSTRIL ONCE DAILY AS NEEDED Patient taking differently: Place 1 spray into both nostrils daily. 1 SPRAY IN EACH NOSTRIL ONCE DAILY AS NEEDED 10/29/21   Sallyanne Kuster, NP  furosemide (LASIX) 20 MG tablet Take 1 tablet (20 mg total) by mouth daily. 11/30/22   Sallyanne Kuster, NP  hydrALAZINE (APRESOLINE) 10 MG tablet Take 1 tablet (10 mg total) by mouth daily as needed. For high BP. 02/08/23   McDonough, Lauren K, PA-C  Liniments (SALONPAS PAIN RELIEF PATCH EX) Apply 1 patch topically 2 (two) times daily.    [provider]  magnesium oxide (MAG-OX) 400 MG tablet Take 1 tablet (400 mg total) by mouth at bedtime. At bedtime 12/28/22   Sallyanne Kuster, NP  Multiple Vitamins-Minerals (CENTRUM SILVER 50+WOMEN) TABS Take by mouth.    [provider]  Polyethyl Glycol-Propyl Glycol (SYSTANE) 0.4-0.3 % SOLN Apply to eye.    [provider]  polyethylene glycol powder (GLYCOLAX/MIRALAX) 17 GM/SCOOP powder Take 17 g by mouth daily as needed for mild constipation or moderate constipation. 03/05/22   Sallyanne Kuster, NP  promethazine (PHENERGAN) 12.5 MG tablet Take 1 tablet (12.5 mg total) by mouth every 6 (six) hours as needed for nausea or vomiting. 08/03/22   Sallyanne Kuster, NP  rOPINIRole (REQUIP) 1 MG tablet Take 1 tablet by mouth in am and 2 tablets by mouth at bedtime 11/30/22   Sallyanne Kuster, NP  traMADol (ULTRAM) 50 MG tablet Take 1 tablet (50 mg total) by mouth in the morning, at noon, and at bedtime. 02/18/23   Sallyanne Kuster, NP  traZODone (DESYREL) 50 MG tablet Take 0.5-1 tablets (25-50 mg total) by mouth at bedtime. 11/30/22   Sallyanne Kuster, NP  triamcinolone cream  (KENALOG) 0.1 % Apply 1 Application topically 2 (two) times daily. To rash area until resolved. 01/04/23   Sallyanne Kuster, NP    Physical Exam   Triage Vital Signs: ED Triage Vitals [03/02/23 1906]  Encounter Vitals Group     BP 132/73     Systolic BP Percentile      Diastolic BP Percentile      Pulse Rate 81     Resp 18     Temp 98.2 F (36.8 C)     Temp Source Oral     SpO2 94 %     Weight 130 lb (59 kg)     Height 5\' 4"  (1.626 m)     Head Circumference      Peak Flow      Pain Score 3     Pain Loc      Pain Education      Exclude  from Growth Chart     Most recent vital signs: Vitals:   03/02/23 1906 03/03/23 0315  BP: 132/73 127/75  Pulse: 81 79  Resp: 18 18  Temp: 98.2 F (36.8 C) 98.1 F (36.7 C)  SpO2: 94% 95%    CONSTITUTIONAL: Alert, responds appropriately to questions. Well-appearing; well-nourished elderly, smiling, pleasant HEAD: Normocephalic, atraumatic EYES: Conjunctivae clear, pupils appear equal, sclera nonicteric ENT: normal nose; moist mucous membranes NECK: Supple, normal ROM, no meningismus CARD: RRR; S1 and S2 appreciated RESP: Normal chest excursion without splinting or tachypnea; breath sounds clear and equal bilaterally; no wheezes, no rhonchi, no rales, no hypoxia or respiratory distress, speaking full sentences ABD/GI: Non-distended; soft, non-tender, no rebound, no guarding, no peritoneal signs BACK: The back appears normal EXT: Normal ROM in all joints; no deformity noted, no edema SKIN: Normal color for age and race; warm; no rash on exposed skin NEURO: Moves all extremities equally, normal speech, reports normal sensation diffusely, cranial nerves intact PSYCH: The patient's mood and manner are appropriate.   ED Results / Procedures / Treatments   LABS: (all labs ordered are listed, but only abnormal results are displayed) Labs Reviewed  CBC WITH DIFFERENTIAL/PLATELET - Abnormal; Notable for the following components:       Result Value   WBC 18.9 (*)    Neutro Abs 17.5 (*)    Lymphs Abs 0.6 (*)    All other components within normal limits  COMPREHENSIVE METABOLIC PANEL - Abnormal; Notable for the following components:   Sodium 134 (*)    Glucose, Bld 124 (*)    BUN 33 (*)    Creatinine, Ser 1.46 (*)    Total Bilirubin 1.4 (*)    GFR, Estimated 34 (*)    All other components within normal limits  URINALYSIS, ROUTINE W REFLEX MICROSCOPIC - Abnormal; Notable for the following components:   Color, Urine AMBER (*)    APPearance HAZY (*)    Protein, ur 100 (*)    Leukocytes,Ua MODERATE (*)    Bacteria, UA RARE (*)    All other components within normal limits  BRAIN NATRIURETIC PEPTIDE - Abnormal; Notable for the following components:   B Natriuretic Peptide 407.3 (*)    All other components within normal limits  RESP PANEL BY RT-PCR (RSV, FLU A&B, COVID)  [id number]  TROPONIN I (HIGH SENSITIVITY)     EKG:   RADIOLOGY: My personal review and interpretation of imaging: CT head unremarkable.  Chest x-ray shows possible mild pulmonary edema.  I have personally reviewed all radiology reports.   DG Chest Portable 1 View  Result Date: 03/03/2023 CLINICAL DATA:  Coughing, with nausea and headache. EXAM: PORTABLE CHEST 1 VIEW COMPARISON:  AP Lat chest 12/01/2022 FINDINGS: The heart is slightly enlarged. There is increased vascular prominence, and interval development of mild interstitial edema and trace pleural effusions in the lung bases. Findings consistent with CHF or fluid overload. The mediastinum is stable. There is calcific plaque in the transverse and descending aorta. The lungs are mildly emphysematous but clear of infiltrates. There is osteopenia. Old postsurgical change left shoulder. Thoracic spondylosis. IMPRESSION: 1. Cardiomegaly with mild interstitial edema and trace pleural effusions consistent with CHF or fluid overload. 2. Emphysema. 3. Aortic atherosclerosis. Electronically Signed   By: Almira Bar M.D.   On: 03/03/2023 00:35   CT HEAD WO CONTRAST ( )  Result Date: 03/02/2023 CLINICAL DATA:  Headache, new onset (Age >= 51y) EXAM: CT HEAD WITHOUT CONTRAST TECHNIQUE: Contiguous axial images  were obtained from the base of the skull through the vertex without intravenous contrast. RADIATION DOSE REDUCTION: This exam was performed according to the departmental dose-optimization program which includes automated exposure control, adjustment of the mA and/or kV according to patient size and/or use of iterative reconstruction technique. COMPARISON:  CT head 11/04/2020. FINDINGS: Brain: Stable mildly enlarged cerebral ventricle sizes that are concordant with the degree of cerebral volume loss. Patchy and confluent areas of decreased attenuation are noted throughout the deep and periventricular white matter of the cerebral hemispheres bilaterally, compatible with chronic microvascular ischemic disease. No evidence of large-territorial acute infarction. No parenchymal hemorrhage. No mass lesion. No extra-axial collection. No mass effect or midline shift. No hydrocephalus. Basilar cisterns are patent. Vascular: No hyperdense vessel. Skull: No acute fracture or focal lesion. Sinuses/Orbits: Paranasal sinuses and mastoid air cells are clear. Bilateral lens replacement. Otherwise the orbits are unremarkable. Other: None. IMPRESSION: No acute intracranial abnormality. Electronically Signed   By: Tish Frederickson M.D.   On: 03/02/2023 23:49     PROCEDURES:  Critical Care performed: No   CRITICAL CARE Performed by: Baxter Hire Sharada Albornoz   Total critical care time: 0 minutes  Critical care time was exclusive of separately billable procedures and treating other patients.  Critical care was necessary to treat or prevent imminent or life-threatening deterioration.  Critical care was time spent personally by me on the following activities: development of treatment plan with patient and/or surrogate as well as  nursing, discussions with consultants, evaluation of patient's response to treatment, examination of patient, obtaining history from patient or surrogate, ordering and performing treatments and interventions, ordering and review of laboratory studies, ordering and review of radiographic studies, pulse oximetry and re-evaluation of patient's condition.   Procedures    IMPRESSION / MDM / ASSESSMENT AND PLAN / ED COURSE  I reviewed the triage vital signs and the nursing notes.    Patient here with complaints of headache, subjective fever, coughing up phlegm today.  Also complaining of dysuria.     DIFFERENTIAL DIAGNOSIS (includes but not limited to):   Viral URI, pneumonia, dehydration, UTI, doubt stroke or intracranial hemorrhage   Patient's presentation is most consistent with acute presentation with potential threat to life or bodily function.   PLAN: Patient is negative for COVID, flu and RSV.  Head CT obtained from triage reviewed and interpreted by myself and radiologist and is unremarkable.  Will give Tylenol for headache here.  Will obtain labs, urine, chest x-ray.   MEDICATIONS GIVEN IN ED: Medications  acetaminophen (TYLENOL) tablet 1,000 mg (1,000 mg Oral Given 03/03/23 0015)  traMADol (ULTRAM) tablet 50 mg (50 mg Oral Given 03/03/23 0055)  rOPINIRole (REQUIP) tablet 2 mg (2 mg Oral Given 03/03/23 0133)  furosemide (LASIX) injection 40 mg (40 mg Intramuscular Given 03/03/23 0134)     ED COURSE: Patient's labs do show leukocytosis of 18,000 but no other signs or symptoms of sepsis today.  She is afebrile.  Normal electrolytes.  Normal hemoglobin.  Urine does not appear infected.  Chest x-ray reviewed and interpreted by myself and the radiologist and shows possible mild pulmonary edema.  BNP added on and is 400 today.  Troponin negative.  She denies any chest pain or shortness of breath.  This could be why she feels like she is coughing up phlegm.  Given Lasix here.  Patient able  to ambulate with sats of 95% and has no respiratory distress or other external signs of volume overload.  I recommend she continue  her Lasix as prescribed.  She states she does not think that she has had it yet today.  Patient lives in a nursing facility.  I feel she is safe to be discharged home and she is also comfortable with this plan.  She reports her headache improved after Tylenol.  She was given her home doses of tramadol and Requip prior to discharge.   At this time, I do not feel there is any life-threatening condition present. I reviewed all nursing notes, vitals, pertinent previous records.  All lab and urine results, EKGs, imaging ordered have been independently reviewed and interpreted by myself.  I reviewed all available radiology reports from any imaging ordered this visit.  Based on my assessment, I feel the patient is safe to be discharged home without further emergent workup and can continue workup as an outpatient as needed. Discussed all findings, treatment plan as well as usual and customary return precautions.  They verbalize understanding and are comfortable with this plan.  Outpatient follow-up has been provided as needed.  All questions have been answered.    CONSULTS:  none   OUTSIDE RECORDS REVIEWED: Reviewed last PCP note on 12/28/2022.       FINAL CLINICAL IMPRESSION(S) / ED DIAGNOSES   Final diagnoses:  Generalized headache  Acute on chronic congestive heart failure, unspecified heart failure type (HCC)     Rx / DC Orders   ED Discharge Orders     None        Note:  This document was prepared using Dragon voice recognition software and may include unintentional dictation errors.   Aloysius Heinle, Layla Maw, DO 03/03/23 [phone]

## 2023-03-03 NOTE — ED Notes (Signed)
Patient discharged at this time.  Breathing unlabored speaking in full sentences. Verbalized understanding of all discharge, follow up, and medication teaching. Discharged homed with all belongings with EMS transport.

## 2023-03-05 ENCOUNTER — Ambulatory Visit (INDEPENDENT_AMBULATORY_CARE_PROVIDER_SITE_OTHER): Payer: Medicare HMO | Admitting: Nurse Practitioner

## 2023-03-05 ENCOUNTER — Encounter: Payer: Self-pay | Admitting: Nurse Practitioner

## 2023-03-05 VITALS — BP 135/70 | HR 61 | Temp 98.1°F | Resp 16 | Ht 64.0 in | Wt 132.0 lb

## 2023-03-05 DIAGNOSIS — R7301 Impaired fasting glucose: Secondary | ICD-10-CM | POA: Diagnosis not present

## 2023-03-05 DIAGNOSIS — Z79899 Other long term (current) drug therapy: Secondary | ICD-10-CM | POA: Diagnosis not present

## 2023-03-05 DIAGNOSIS — N289 Disorder of kidney and ureter, unspecified: Secondary | ICD-10-CM

## 2023-03-05 DIAGNOSIS — I5023 Acute on chronic systolic (congestive) heart failure: Secondary | ICD-10-CM | POA: Diagnosis not present

## 2023-03-05 DIAGNOSIS — E871 Hypo-osmolality and hyponatremia: Secondary | ICD-10-CM

## 2023-03-05 MED ORDER — TRAZODONE HCL 50 MG PO TABS
50.0000 mg | ORAL_TABLET | Freq: Every day | ORAL | 6 refills | Status: DC
Start: 2023-03-05 — End: 2023-10-26

## 2023-03-05 MED ORDER — FUROSEMIDE 40 MG PO TABS
40.0000 mg | ORAL_TABLET | Freq: Every day | ORAL | 3 refills | Status: DC
Start: 2023-03-05 — End: 2023-04-19

## 2023-03-05 NOTE — Progress Notes (Signed)
[facility] [address] [place], [place] [address]  Internal MEDICINE  Office Visit Note  Patient Name: Kristen Pennington  [id number]  [id number]  Date of Service: 03/05/2023  Chief Complaint  Patient presents with   Follow-up    ED f/u- dizzy, nausea, pain in all over body this morning     HPI Kristen Pennington presents for a follow-up visit for ED visit for dizziness, nausea and pain.   Patient 2 caps fell off her 2 front teeth and getting the teeth fixed is very expensive per her dentist. Having body aches, nausea and dizziness. Feels like she has not been doing very well, is fatigued and has low energy. Due for labs to be checked to rule out possible causes.   Current Medication: Outpatient Encounter Medications as of 03/05/2023  Medication Sig   acetaminophen (TYLENOL) 500 MG tablet Take 500 mg by mouth every 6 (six) hours as needed.   bisoprolol-hydrochlorothiazide (ZIAC) 5-6.25 MG tablet Take 1 tablet by mouth daily.   Cholecalciferol (VITAMIN D3) 5000 units TABS Take 5,000 Units by mouth daily.   cyclobenzaprine (FLEXERIL) 10 MG tablet Take 1 tablet (10 mg total) by mouth at bedtime as needed for muscle spasms.   escitalopram (LEXAPRO) 10 MG tablet TAKE 1 TABLET BY MOUTH DAILY   estradiol (ESTRACE) 0.1 MG/GM vaginal cream USE ONE APPLICATORFUL AS DIRECTED ONCE WEEKLY AS NEEDED   fluticasone (FLONASE) 50 MCG/ACT nasal spray 1 SPRAY IN EACH NOSTRIL ONCE DAILY AS NEEDED (Patient taking differently: Place 1 spray into both nostrils daily. 1 SPRAY IN EACH NOSTRIL ONCE DAILY AS NEEDED)   furosemide (LASIX) 40 MG tablet Take 1 tablet (40 mg total) by mouth daily.   hydrALAZINE (APRESOLINE) 10 MG tablet Take 1 tablet (10 mg total) by mouth daily as needed. For high BP.   Liniments (SALONPAS PAIN RELIEF PATCH EX) Apply 1 patch topically 2 (two) times daily.   magnesium oxide (MAG-OX) 400 MG tablet Take 1 tablet (400 mg total) by mouth at bedtime. At bedtime   Multiple  Vitamins-Minerals (CENTRUM SILVER 50+WOMEN) TABS Take by mouth.   Polyethyl Glycol-Propyl Glycol (SYSTANE) 0.4-0.3 % SOLN Apply to eye.   polyethylene glycol powder (GLYCOLAX/MIRALAX) 17 GM/SCOOP powder Take 17 g by mouth daily as needed for mild constipation or moderate constipation.   promethazine (PHENERGAN) 12.5 MG tablet Take 1 tablet (12.5 mg total) by mouth every 6 (six) hours as needed for nausea or vomiting.   rOPINIRole (REQUIP) 1 MG tablet Take 1 tablet by mouth in am and 2 tablets by mouth at bedtime   traMADol (ULTRAM) 50 MG tablet Take 1 tablet (50 mg total) by mouth in the morning, at noon, and at bedtime.   triamcinolone cream (KENALOG) 0.1 % Apply 1 Application topically 2 (two) times daily. To rash area until resolved.   [DISCONTINUED] furosemide (LASIX) 20 MG tablet Take 1 tablet (20 mg total) by mouth daily.   [DISCONTINUED] traZODone (DESYREL) 50 MG tablet Take 0.5-1 tablets (25-50 mg total) by mouth at bedtime.   traZODone (DESYREL) 50 MG tablet Take 1 tablet (50 mg total) by mouth at bedtime.   No facility-administered encounter medications on file as of 03/05/2023.    Surgical History: Past Surgical History:  Procedure Laterality Date   COLONOSCOPY  05-03-15   COLONOSCOPY WITH PROPOFOL N/A 05/03/2015   Procedure: COLONOSCOPY WITH PROPOFOL;  Surgeon: Midge Minium, MD;  Location: [facility];  Service: Endoscopy;  Laterality: N/A;  CPAP   DILATION AND CURETTAGE  OF UTERUS  1970   ENDOMETRIAL BIOPSY     EYE SURGERY Right 2017   POLYPECTOMY  05/03/2015   Procedure: POLYPECTOMY;  Surgeon: Midge Minium, MD;  Location: [facility];  Service: Endoscopy;;   ROTATOR CUFF REPAIR  2007   SPINE SURGERY      Medical History: Past Medical History:  Diagnosis Date   Arthritis    Atrophic vaginitis 03/06/2013   Last Assessment & Plan:  She will plan to continue estradiol vaginal cream.  Prescription was rewritten stipulating use of the generic product.    Atypical  migraine 03/06/2013   Last Assessment & Plan:  Since she rarely takes sumatriptan, we will discontinue it. Continue sparing use of OTC migraine products.    Avitaminosis D 03/06/2013   Last Assessment & Plan:  Recheck vitamin D level. Plan to continue vitamin D supplementation.    Ceratitis 08/01/2013   Last Assessment & Plan:  Because of the expense, she will discontinue Restasis. She will use over-the-counter moisturizing eyedrops and liquid gel.    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    Cystocele, midline 03/06/2013   Demoralization and apathy 03/06/2013   Last Assessment & Plan:  Since she has been taking bupropion only once a day, I will write the prescription with the correct instructions and correct quantity. She has done well on this for years and she is encouraged to take it regularly    Depression    Environmental allergies    Epistaxis    Essential (primary) hypertension 05/17/2013   Last Assessment & Plan:  Her blood pressure is well-controlled. We will plan to continue hydrochlorothiazide and benazepril.  Both are generic and should be affordable on her health plan.    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 We will continue that regimen.    Sinusitis    Sleep apnea    CPAP    Family History: Family History  Problem Relation Age of Onset   Arthritis Mother    Alzheimer's disease Father    Heart disease Sister    Cancer Brother    Ovarian cancer Neg Hx    Breast cancer Neg Hx    Colon cancer Neg Hx    Diabetes Neg Hx      Social History   Socioeconomic History   Marital status: Widowed    Spouse name: Not on file   Number of children: 2   Years of education: 35   Highest education level: Some college, no degree  Occupational History   Not on file  Tobacco Use   Smoking status: Never    Passive exposure: Never   Smokeless tobacco: Never  Vaping Use   Vaping status: Never Used  Substance and Sexual Activity   Alcohol use: No    Alcohol/week: 0.0 standard drinks of alcohol   Drug use: No   Sexual activity: Not Currently  Other Topics Concern   Not on file  Social History Narrative   Not on file   Social Determinants of Health  Financial Resource Strain: Low Risk  (12/31/2021)   Overall Financial Resource Strain (CARDIA)    Difficulty of Paying Living Expenses: Not hard at all  Food Insecurity: No Food Insecurity (12/31/2021)   Hunger Vital Sign    Worried About Running Out of Food in the Last Year: Never true    Ran Out of Food in the Last Year: Never true  Transportation Needs: No Transportation Needs (12/31/2021)   PRAPARE - Administrator, Civil Service (Medical): No    Lack of Transportation (Non-Medical): No  Physical Activity: Inactive (12/31/2021)   Exercise Vital Sign    Days of Exercise per Week: 0 days    Minutes of Exercise per Session: 0 min  Stress: Stress Concern Present (12/31/2021)   [employer] of Occupational Health - Occupational Stress Questionnaire    Feeling of Stress : To some extent  Social Connections: Moderately Isolated (12/31/2021)   Social Connection and Isolation Panel [NHANES]    Frequency of Communication with Friends and Family: More than three times a week    Frequency of Social Gatherings with Friends and Family: More than three times a week    Attends Religious Services: More than 4 times per year    Active Member of [employer] or Organizations: No    Attends Banker Meetings: Never    Marital Status: Widowed  Intimate  Partner Violence: Not At Risk (12/31/2021)   Humiliation, Afraid, Rape, and Kick questionnaire    Fear of Current or Ex-Partner: No    Emotionally Abused: No    Physically Abused: No    Sexually Abused: No      Review of Systems  Constitutional:  Positive for fatigue.  HENT:  Positive for dental problem and hearing loss. Negative for postnasal drip, rhinorrhea, sinus pressure and sinus pain.   Eyes:  Positive for visual disturbance.  Respiratory:  Negative for cough, chest tightness, shortness of breath and wheezing.   Cardiovascular: Negative.  Negative for chest pain and palpitations.  Gastrointestinal:  Positive for constipation. Negative for abdominal distention, abdominal pain, diarrhea, nausea and vomiting.  Musculoskeletal:  Positive for arthralgias and back pain.  Neurological:  Positive for dizziness, weakness and headaches.    Vital Signs: BP 135/70 Comment: 164/78  Pulse 61   Temp 98.1 F (36.7 C)   Resp 16   Ht 5\' 4"  (1.626 m)   Wt 132 lb (59.9 kg)   SpO2 96%   BMI 22.66 kg/m    Physical Exam Vitals reviewed.  Constitutional:      General: She is not in acute distress.    Appearance: Normal appearance. She is normal weight. She is not ill-appearing.  HENT:     Head: Normocephalic and atraumatic.     Mouth/Throat:     Dentition: Abnormal dentition (missing teeth).  Eyes:     Pupils: Pupils are equal, round, and reactive to light.  Cardiovascular:     Rate and Rhythm: Normal rate and regular rhythm.  Pulmonary:     Effort: Pulmonary effort is normal. No respiratory distress.  Neurological:     Mental Status: She is alert and oriented to person, place, and time.     Coordination: Coordination normal.  Psychiatric:        Mood and Affect: Mood normal.        Behavior: Behavior normal. Behavior is cooperative.        Assessment/Plan: 1. Acute on chronic systolic congestive heart failure (HCC) Lasix dose increased, labs  ordered for further  evaluation.  - furosemide (LASIX) 40 MG tablet; Take 1 tablet (40 mg total) by mouth daily.  Dispense: 30 tablet; Refill: 3 - CBC with Differential/Platelet - CMP14+EGFR - Hgb A1C w/o eAG - Pro b natriuretic peptide (BNP)[facility])  2. Abnormal kidney function Labs ordered for further evaluation - CBC with Differential/Platelet - CMP14+EGFR - Hgb A1C w/o eAG - Pro b natriuretic peptide (BNP)[facility] [facility])  3. Impaired fasting glucose Rule out diabetes, a1c - Hgb A1C w/o eAG  4. Hyponatremia Labs ordered  - CBC with Differential/Platelet - CMP14+EGFR - Hgb A1C w/o eAG - Pro b natriuretic peptide (BNP)[facility])  5. Encounter for medication review Continue trazodone as prescribed.  - traZODone (DESYREL) 50 MG tablet; Take 1 tablet (50 mg total) by mouth at bedtime.  Dispense: 30 tablet; Refill: 6   General Counseling: Kristen Pennington understanding of the findings of todays visit and agrees with plan of treatment. I have discussed any further diagnostic evaluation that may be needed or ordered today. We also reviewed her medications today. she has been encouraged to call the office with any questions or concerns that should arise related to todays visit.    Orders Placed This Encounter  Procedures   CBC with Differential/Platelet   CMP14+EGFR   Hgb A1C w/o eAG   Pro b natriuretic peptide (BNP)[facility])    Meds ordered this encounter  Medications   furosemide (LASIX) 40 MG tablet    Sig: Take 1 tablet (40 mg total) by mouth daily.    Dispense:  30 tablet    Refill:  3   traZODone (DESYREL) 50 MG tablet    Sig: Take 1 tablet (50 mg total) by mouth at bedtime.    Dispense:  30 tablet    Refill:  6    FOR FUTURE REFILLS **BUBBLE PACK PT**    Return in about 2 weeks (around 03/19/2023) for F/U, Labs, Airel Magadan PCP, eval new med.   Total time spent:30 Minutes Time spent includes  review of chart, medications, test results, and follow up plan with the patient.   [place] Controlled Substance Database was reviewed by me.  This patient was seen by Sallyanne Kuster, FNP-C in collaboration with Dr. Beverely Risen as a part of collaborative care agreement.   Karrin Eisenmenger R. Tedd Sias, MSN, FNP-C Internal medicine

## 2023-03-06 ENCOUNTER — Encounter: Payer: Self-pay | Admitting: Nurse Practitioner

## 2023-03-10 NOTE — Progress Notes (Signed)
Cough/shortness of breath with concern for possible COVID or influenza

## 2023-03-16 ENCOUNTER — Ambulatory Visit: Payer: Medicare HMO | Admitting: Nurse Practitioner

## 2023-03-16 ENCOUNTER — Ambulatory Visit: Payer: Medicare HMO

## 2023-03-16 DIAGNOSIS — R7301 Impaired fasting glucose: Secondary | ICD-10-CM | POA: Diagnosis not present

## 2023-03-16 DIAGNOSIS — E871 Hypo-osmolality and hyponatremia: Secondary | ICD-10-CM | POA: Diagnosis not present

## 2023-03-16 DIAGNOSIS — N289 Disorder of kidney and ureter, unspecified: Secondary | ICD-10-CM | POA: Diagnosis not present

## 2023-03-16 DIAGNOSIS — I5023 Acute on chronic systolic (congestive) heart failure: Secondary | ICD-10-CM | POA: Diagnosis not present

## 2023-03-17 LAB — CBC WITH DIFFERENTIAL/PLATELET
Basophils Absolute: 0.1 10*3/uL (ref 0.0–0.2)
Basos: 1 %
EOS (ABSOLUTE): 0.1 10*3/uL (ref 0.0–0.4)
Eos: 1 %
Hematocrit: 40.5 % (ref 34.0–46.6)
Hemoglobin: 13.1 g/dL (ref 11.1–15.9)
Immature Grans (Abs): 0 10*3/uL (ref 0.0–0.1)
Immature Granulocytes: 0 %
Lymphocytes Absolute: 1.9 10*3/uL (ref 0.7–3.1)
Lymphs: 18 %
MCH: 30.9 pg (ref 26.6–33.0)
MCHC: 32.3 g/dL (ref 31.5–35.7)
MCV: 96 fL (ref 79–97)
Monocytes Absolute: 1 10*3/uL — ABNORMAL HIGH (ref 0.1–0.9)
Monocytes: 9 %
Neutrophils Absolute: 7.5 10*3/uL — ABNORMAL HIGH (ref 1.4–7.0)
Neutrophils: 71 %
Platelets: 293 10*3/uL (ref 150–450)
RBC: 4.24 x10E6/uL (ref 3.77–5.28)
RDW: 11.9 % (ref 11.7–15.4)
WBC: 10.6 10*3/uL (ref 3.4–10.8)

## 2023-03-17 LAB — CMP14+EGFR
ALT: 16 IU/L (ref 0–32)
AST: 24 IU/L (ref 0–40)
Albumin: 4.5 g/dL (ref 3.6–4.6)
Alkaline Phosphatase: 68 IU/L (ref 44–121)
BUN/Creatinine Ratio: 27 (ref 12–28)
BUN: 30 mg/dL (ref 10–36)
Bilirubin Total: 0.3 mg/dL (ref 0.0–1.2)
CO2: 27 mmol/L (ref 20–29)
Calcium: 10.4 mg/dL — ABNORMAL HIGH (ref 8.7–10.3)
Chloride: 99 mmol/L (ref 96–106)
Creatinine, Ser: 1.11 mg/dL — ABNORMAL HIGH (ref 0.57–1.00)
Globulin, Total: 2.1 g/dL (ref 1.5–4.5)
Glucose: 87 mg/dL (ref 70–99)
Potassium: 5.3 mmol/L — ABNORMAL HIGH (ref 3.5–5.2)
Sodium: 139 mmol/L (ref 134–144)
Total Protein: 6.6 g/dL (ref 6.0–8.5)
eGFR: 47 mL/min/{1.73_m2} — ABNORMAL LOW (ref >59)

## 2023-03-17 LAB — PRO B NATRIURETIC PEPTIDE: NT-Pro BNP: 670 pg/mL (ref 0–738)

## 2023-03-17 LAB — HGB A1C W/O EAG: Hgb A1c MFr Bld: 5.9 % — ABNORMAL HIGH (ref 4.8–5.6)

## 2023-03-18 ENCOUNTER — Ambulatory Visit: Payer: Medicare HMO | Admitting: Nurse Practitioner

## 2023-03-18 ENCOUNTER — Encounter: Payer: Self-pay | Admitting: Nurse Practitioner

## 2023-03-18 VITALS — BP 138/80 | HR 66 | Temp 98.5°F | Resp 16 | Ht 64.0 in | Wt 130.6 lb

## 2023-03-18 DIAGNOSIS — E875 Hyperkalemia: Secondary | ICD-10-CM | POA: Diagnosis not present

## 2023-03-18 DIAGNOSIS — E538 Deficiency of other specified B group vitamins: Secondary | ICD-10-CM

## 2023-03-18 DIAGNOSIS — N1831 Chronic kidney disease, stage 3a: Secondary | ICD-10-CM

## 2023-03-18 DIAGNOSIS — E871 Hypo-osmolality and hyponatremia: Secondary | ICD-10-CM | POA: Diagnosis not present

## 2023-03-18 DIAGNOSIS — I10 Essential (primary) hypertension: Secondary | ICD-10-CM | POA: Diagnosis not present

## 2023-03-18 DIAGNOSIS — F411 Generalized anxiety disorder: Secondary | ICD-10-CM

## 2023-03-18 MED ORDER — CYANOCOBALAMIN 1000 MCG/ML IJ SOLN
1000.0000 ug | Freq: Once | INTRAMUSCULAR | Status: AC
Start: 2023-03-18 — End: 2023-03-18
  Administered 2023-03-18: 1000 ug via INTRAMUSCULAR

## 2023-03-18 NOTE — Progress Notes (Signed)
[facility] [address] [place], [place] [address]  Internal MEDICINE  Office Visit Note  Patient Name: Kristen Pennington  [id number]  [id number]  Date of Service: 03/18/2023  Chief Complaint  Patient presents with   Depression   Gastroesophageal Reflux   Hypertension   Hyperlipidemia   Follow-up    HPI Karmon presents for a follow-up visit for lab results  B12 deficiency -fatigued, requesting B12 injection Hyperkalemia -- level elevated at 5.3 Prediabetes -- A1c elevated at 5.9, hx of impaired fasting glucose but most recent is normal.  BNP normal, was high in hospital WBC normal and abs neutrophils improving Hypercalcemia -- elevated slightly CKD stage 3a -- decreased GFR to 47, creatinine is improving at 1.11 Hyponatremia -- resolved     Current Medication: Outpatient Encounter Medications as of 03/18/2023  Medication Sig   acetaminophen (TYLENOL) 500 MG tablet Take 500 mg by mouth every 6 (six) hours as needed.   Cholecalciferol (VITAMIN D3) 5000 units TABS Take 5,000 Units by mouth daily.   escitalopram (LEXAPRO) 10 MG tablet TAKE 1 TABLET BY MOUTH DAILY   magnesium oxide (MAG-OX) 400 MG tablet Take 1 tablet (400 mg total) by mouth at bedtime. At bedtime   polyethylene glycol powder (GLYCOLAX/MIRALAX) 17 GM/SCOOP powder Take 17 g by mouth daily as needed for mild constipation or moderate constipation.   rOPINIRole (REQUIP) 1 MG tablet Take 1 tablet by mouth in am and 2 tablets by mouth at bedtime   traMADol (ULTRAM) 50 MG tablet Take 1 tablet (50 mg total) by mouth in the morning, at noon, and at bedtime.   traZODone (DESYREL) 50 MG tablet Take 1 tablet (50 mg total) by mouth at bedtime.   triamcinolone cream (KENALOG) 0.1 % Apply 1 Application topically 2 (two) times daily. To rash area until resolved.   [DISCONTINUED] bisoprolol-hydrochlorothiazide (ZIAC) 5-6.25 MG tablet Take 1 tablet by mouth daily.   [DISCONTINUED] cyclobenzaprine (FLEXERIL) 10 MG tablet  Take 1 tablet (10 mg total) by mouth at bedtime as needed for muscle spasms. (Patient not taking: Reported on 04/03/2023)   [DISCONTINUED] estradiol (ESTRACE) 0.1 MG/GM vaginal cream USE ONE APPLICATORFUL AS DIRECTED ONCE WEEKLY AS NEEDED (Patient not taking: Reported on 04/03/2023)   [DISCONTINUED] fluticasone (FLONASE) 50 MCG/ACT nasal spray 1 SPRAY IN EACH NOSTRIL ONCE DAILY AS NEEDED (Patient taking differently: Place 1 spray into both nostrils daily. 1 SPRAY IN EACH NOSTRIL ONCE DAILY AS NEEDED)   [DISCONTINUED] furosemide (LASIX) 40 MG tablet Take 1 tablet (40 mg total) by mouth daily.   [DISCONTINUED] hydrALAZINE (APRESOLINE) 10 MG tablet Take 1 tablet (10 mg total) by mouth daily as needed. For high BP. (Patient not taking: Reported on 04/03/2023)   [DISCONTINUED] Liniments (SALONPAS PAIN RELIEF PATCH EX) Apply 1 patch topically 2 (two) times daily.   [DISCONTINUED] Multiple Vitamins-Minerals (CENTRUM SILVER 50+WOMEN) TABS Take by mouth.   [DISCONTINUED] Polyethyl Glycol-Propyl Glycol (SYSTANE) 0.4-0.3 % SOLN Apply to eye.   [DISCONTINUED] promethazine (PHENERGAN) 12.5 MG tablet Take 1 tablet (12.5 mg total) by mouth every 6 (six) hours as needed for nausea or vomiting.   [EXPIRED] cyanocobalamin (VITAMIN B12) injection 1,000 mcg    No facility-administered encounter medications on file as of 03/18/2023.    Surgical History: Past Surgical History:  Procedure Laterality Date   COLONOSCOPY  05-03-15   COLONOSCOPY WITH PROPOFOL N/A 05/03/2015   Procedure: COLONOSCOPY WITH PROPOFOL;  Surgeon: Midge Minium, MD;  Location: [facility];  Service: Endoscopy;  Laterality: N/A;  CPAP   DILATION AND CURETTAGE OF UTERUS  1970   ENDOMETRIAL BIOPSY     EYE SURGERY Right 2017   POLYPECTOMY  05/03/2015   Procedure: POLYPECTOMY;  Surgeon: Midge Minium, MD;  Location: [facility];  Service: Endoscopy;;   ROTATOR CUFF REPAIR  2007   SPINE SURGERY      Medical History: Past Medical  History:  Diagnosis Date   Arthritis    Atrophic vaginitis 03/06/2013   Last Assessment & Plan:  She will plan to continue estradiol vaginal cream.  Prescription was rewritten stipulating use of the generic product.    Atypical migraine 03/06/2013   Last Assessment & Plan:  Since she rarely takes sumatriptan, we will discontinue it. Continue sparing use of OTC migraine products.    Avitaminosis D 03/06/2013   Last Assessment & Plan:  Recheck vitamin D level. Plan to continue vitamin D supplementation.    Ceratitis 08/01/2013   Last Assessment & Plan:  Because of the expense, she will discontinue Restasis. She will use over-the-counter moisturizing eyedrops and liquid gel.    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    Cystocele, midline 03/06/2013   Demoralization and apathy 03/06/2013   Last Assessment & Plan:  Since she has been taking bupropion only once a day, I will write the prescription with the correct instructions and correct quantity. She has done well on this for years and she is encouraged to take it regularly    Depression    Environmental allergies    Epistaxis    Essential (primary) hypertension 05/17/2013   Last Assessment & Plan:  Her blood pressure is well-controlled. We will plan to continue hydrochlorothiazide and benazepril.  Both are generic and should be affordable on her health plan.    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 We will continue that regimen.    Sinusitis    Sleep apnea    CPAP    Family History: Family History   Problem Relation Age of Onset   Arthritis Mother    Alzheimer's disease Father    Heart disease Sister    Cancer Brother    Ovarian cancer Neg Hx    Breast cancer Neg Hx    Colon cancer Neg Hx    Diabetes Neg Hx     Social History   Socioeconomic History   Marital status: Widowed    Spouse name: Not on file   Number of children: 2   Years of education: 69   Highest education level: Some college, no degree  Occupational History   Not on file  Tobacco Use   Smoking status: Never    Passive exposure: Never   Smokeless tobacco: Never  Vaping Use   Vaping status: Never Used  Substance and Sexual Activity   Alcohol use: No    Alcohol/week: 0.0 standard drinks of alcohol   Drug use: No   Sexual activity: Not Currently  Other Topics Concern   Not on file  Social History Narrative   Not on file  Social Determinants of Health   Financial Resource Strain: Low Risk  (04/19/2023)   Overall Financial Resource Strain (CARDIA)    Difficulty of Paying Living Expenses: Not hard at all  Food Insecurity: No Food Insecurity (04/19/2023)   Hunger Vital Sign    Worried About Running Out of Food in the Last Year: Never true    Ran Out of Food in the Last Year: Never true  Transportation Needs: No Transportation Needs (04/19/2023)   PRAPARE - Administrator, Civil Service (Medical): No    Lack of Transportation (Non-Medical): No  Physical Activity: Inactive (04/19/2023)   Exercise Vital Sign    Days of Exercise per Week: 0 days    Minutes of Exercise per Session: 0 min  Stress: Stress Concern Present (04/19/2023)   [employer] of Occupational Health - Occupational Stress Questionnaire    Feeling of Stress : To some extent  Social Connections: Moderately Isolated (04/19/2023)   Social Connection and Isolation Panel [NHANES]    Frequency of Communication with Friends and Family: Twice a week    Frequency of Social Gatherings with Friends and Family: Once a week     Attends Religious Services: 1 to 4 times per year    Active Member of [employer] or Organizations: No    Attends Banker Meetings: Never    Marital Status: Widowed  Intimate Partner Violence: Not At Risk (04/19/2023)   Humiliation, Afraid, Rape, and Kick questionnaire    Fear of Current or Ex-Partner: No    Emotionally Abused: No    Physically Abused: No    Sexually Abused: No      Review of Systems  Constitutional:  Positive for fatigue.  HENT:  Positive for dental problem and hearing loss. Negative for postnasal drip, rhinorrhea, sinus pressure and sinus pain.   Eyes:  Positive for visual disturbance.  Respiratory:  Negative for cough, chest tightness, shortness of breath and wheezing.   Cardiovascular: Negative.  Negative for chest pain and palpitations.  Gastrointestinal:  Positive for constipation. Negative for abdominal distention, abdominal pain, diarrhea, nausea and vomiting.  Musculoskeletal:  Positive for arthralgias and back pain.  Neurological:  Positive for dizziness, weakness and headaches.    Vital Signs: BP 138/80 Comment: 160/76  Pulse 66   Temp 98.5 F (36.9 C)   Resp 16   Ht 5\' 4"  (1.626 m)   Wt 130 lb 9.6 oz (59.2 kg)   SpO2 94%   BMI 22.42 kg/m    Physical Exam Vitals reviewed.  Constitutional:      General: She is not in acute distress.    Appearance: Normal appearance. She is normal weight. She is not ill-appearing.  HENT:     Head: Normocephalic and atraumatic.     Mouth/Throat:     Dentition: Abnormal dentition (missing teeth).  Eyes:     Pupils: Pupils are equal, round, and reactive to light.  Cardiovascular:     Rate and Rhythm: Normal rate and regular rhythm.  Pulmonary:     Effort: Pulmonary effort is normal. No respiratory distress.  Neurological:     Mental Status: She is alert and oriented to person, place, and time.     Coordination: Coordination normal.  Psychiatric:        Mood and Affect: Mood normal.         Behavior: Behavior normal. Behavior is cooperative.        Assessment/Plan: 1. Stage 3a chronic kidney disease (HCC) GFR 47, creatinine  is improving. Will repeat labs in a couple months  2. Hyperkalemia Elevated slightly, will repeat in a couple months  3. Essential (primary) hypertension Stable, continue medications as prescribed.   4. Hyponatremia Resolved   5. B12 deficiency B12 injection administered in office today to help with fatigue  - cyanocobalamin (VITAMIN B12) injection 1,000 mcg  6. Generalized anxiety disorder Continue lexapro as prescribed. Has support from a neighbor who is transporting her to her appointments and attending them with her.   General Counseling: tasheika loft understanding of the findings of todays visit and agrees with plan of treatment. I have discussed any further diagnostic evaluation that may be needed or ordered today. We also reviewed her medications today. she has been encouraged to call the office with any questions or concerns that should arise related to todays visit.    No orders of the defined types were placed in this encounter.   Meds ordered this encounter  Medications   cyanocobalamin (VITAMIN B12) injection 1,000 mcg    Return in about 1 month (around 04/17/2023) for F/U, Caidyn Henricksen PCP.   Total time spent:30 Minutes Time spent includes review of chart, medications, test results, and follow up plan with the patient.   [place] Controlled Substance Database was reviewed by me.  This patient was seen by Sallyanne Kuster, FNP-C in collaboration with Dr. Beverely Risen as a part of collaborative care agreement.   Orian Figueira R. Tedd Sias, MSN, FNP-C Internal medicine

## 2023-03-29 ENCOUNTER — Ambulatory Visit: Payer: Medicare HMO | Admitting: Nurse Practitioner

## 2023-04-02 ENCOUNTER — Encounter: Payer: Self-pay | Admitting: Nurse Practitioner

## 2023-04-03 ENCOUNTER — Inpatient Hospital Stay: Payer: Medicare HMO

## 2023-04-03 ENCOUNTER — Other Ambulatory Visit: Payer: Self-pay

## 2023-04-03 ENCOUNTER — Inpatient Hospital Stay
Admission: EM | Admit: 2023-04-03 | Discharge: 2023-04-05 | DRG: 193 | Disposition: A | Discharge status: Nursing Home (Includes ICF) | Payer: Medicare HMO | Source: Skilled Nursing Facility | Attending: Internal Medicine | Admitting: Internal Medicine

## 2023-04-03 ENCOUNTER — Emergency Department: Payer: Medicare HMO

## 2023-04-03 DIAGNOSIS — J9601 Acute respiratory failure with hypoxia: Secondary | ICD-10-CM

## 2023-04-03 DIAGNOSIS — R059 Cough, unspecified: Secondary | ICD-10-CM | POA: Diagnosis not present

## 2023-04-03 DIAGNOSIS — J189 Pneumonia, unspecified organism: Secondary | ICD-10-CM

## 2023-04-03 DIAGNOSIS — Z886 Allergy status to analgesic agent status: Secondary | ICD-10-CM

## 2023-04-03 DIAGNOSIS — I700 Atherosclerosis of aorta: Secondary | ICD-10-CM | POA: Diagnosis not present

## 2023-04-03 DIAGNOSIS — G4733 Obstructive sleep apnea (adult) (pediatric): Secondary | ICD-10-CM | POA: Diagnosis present

## 2023-04-03 DIAGNOSIS — R1013 Epigastric pain: Secondary | ICD-10-CM | POA: Diagnosis not present

## 2023-04-03 DIAGNOSIS — I129 Hypertensive chronic kidney disease with stage 1 through stage 4 chronic kidney disease, or unspecified chronic kidney disease: Secondary | ICD-10-CM | POA: Diagnosis not present

## 2023-04-03 DIAGNOSIS — Z91199 Patient's noncompliance with other medical treatment and regimen due to unspecified reason: Secondary | ICD-10-CM | POA: Diagnosis not present

## 2023-04-03 DIAGNOSIS — F32A Depression, unspecified: Secondary | ICD-10-CM | POA: Diagnosis present

## 2023-04-03 DIAGNOSIS — Z66 Do not resuscitate: Secondary | ICD-10-CM | POA: Diagnosis not present

## 2023-04-03 DIAGNOSIS — R918 Other nonspecific abnormal finding of lung field: Secondary | ICD-10-CM | POA: Diagnosis not present

## 2023-04-03 DIAGNOSIS — M545 Low back pain, unspecified: Secondary | ICD-10-CM | POA: Diagnosis not present

## 2023-04-03 DIAGNOSIS — E559 Vitamin D deficiency, unspecified: Secondary | ICD-10-CM | POA: Diagnosis present

## 2023-04-03 DIAGNOSIS — N1831 Chronic kidney disease, stage 3a: Secondary | ICD-10-CM | POA: Diagnosis not present

## 2023-04-03 DIAGNOSIS — F1120 Opioid dependence, uncomplicated: Secondary | ICD-10-CM | POA: Diagnosis not present

## 2023-04-03 DIAGNOSIS — M1990 Unspecified osteoarthritis, unspecified site: Secondary | ICD-10-CM | POA: Diagnosis present

## 2023-04-03 DIAGNOSIS — G894 Chronic pain syndrome: Secondary | ICD-10-CM | POA: Diagnosis not present

## 2023-04-03 DIAGNOSIS — I2510 Atherosclerotic heart disease of native coronary artery without angina pectoris: Secondary | ICD-10-CM | POA: Diagnosis not present

## 2023-04-03 DIAGNOSIS — R110 Nausea: Secondary | ICD-10-CM | POA: Diagnosis not present

## 2023-04-03 DIAGNOSIS — I509 Heart failure, unspecified: Secondary | ICD-10-CM | POA: Diagnosis not present

## 2023-04-03 DIAGNOSIS — K219 Gastro-esophageal reflux disease without esophagitis: Secondary | ICD-10-CM | POA: Diagnosis present

## 2023-04-03 DIAGNOSIS — E785 Hyperlipidemia, unspecified: Secondary | ICD-10-CM | POA: Diagnosis not present

## 2023-04-03 DIAGNOSIS — G43009 Migraine without aura, not intractable, without status migrainosus: Secondary | ICD-10-CM | POA: Diagnosis not present

## 2023-04-03 DIAGNOSIS — G2581 Restless legs syndrome: Secondary | ICD-10-CM

## 2023-04-03 DIAGNOSIS — G259 Extrapyramidal and movement disorder, unspecified: Secondary | ICD-10-CM | POA: Diagnosis present

## 2023-04-03 DIAGNOSIS — Z1152 Encounter for screening for COVID-19: Secondary | ICD-10-CM

## 2023-04-03 DIAGNOSIS — R748 Abnormal levels of other serum enzymes: Secondary | ICD-10-CM | POA: Diagnosis present

## 2023-04-03 DIAGNOSIS — G8929 Other chronic pain: Secondary | ICD-10-CM | POA: Diagnosis not present

## 2023-04-03 DIAGNOSIS — Z8249 Family history of ischemic heart disease and other diseases of the circulatory system: Secondary | ICD-10-CM | POA: Diagnosis not present

## 2023-04-03 DIAGNOSIS — Z8261 Family history of arthritis: Secondary | ICD-10-CM

## 2023-04-03 DIAGNOSIS — F419 Anxiety disorder, unspecified: Secondary | ICD-10-CM | POA: Diagnosis present

## 2023-04-03 DIAGNOSIS — Z86010 Personal history of colonic polyps: Secondary | ICD-10-CM

## 2023-04-03 DIAGNOSIS — Z79899 Other long term (current) drug therapy: Secondary | ICD-10-CM

## 2023-04-03 HISTORY — DX: Pneumonia, unspecified organism: J18.9

## 2023-04-03 LAB — COMPREHENSIVE METABOLIC PANEL
ALT: 28 U/L (ref 0–44)
AST: 49 U/L — ABNORMAL HIGH (ref 15–41)
Albumin: 3.6 g/dL (ref 3.5–5.0)
Alkaline Phosphatase: 54 U/L (ref 38–126)
Anion gap: 8 (ref 5–15)
BUN: 22 mg/dL (ref 8–23)
CO2: 25 mmol/L (ref 22–32)
Calcium: 9 mg/dL (ref 8.9–10.3)
Chloride: 104 mmol/L (ref 98–111)
Creatinine, Ser: 0.97 mg/dL (ref 0.44–1.00)
GFR, Estimated: 55 mL/min — ABNORMAL LOW (ref >60)
Glucose, Bld: 87 mg/dL (ref 70–99)
Potassium: 3.7 mmol/L (ref 3.5–5.1)
Sodium: 137 mmol/L (ref 135–145)
Total Bilirubin: 1.6 mg/dL — ABNORMAL HIGH (ref 0.3–1.2)
Total Protein: 7 g/dL (ref 6.5–8.1)

## 2023-04-03 LAB — CBC WITH DIFFERENTIAL/PLATELET
Abs Immature Granulocytes: 0.09 10*3/uL — ABNORMAL HIGH (ref 0.00–0.07)
Basophils Absolute: 0 10*3/uL (ref 0.0–0.1)
Basophils Relative: 0 %
Eosinophils Absolute: 0.1 10*3/uL (ref 0.0–0.5)
Eosinophils Relative: 1 %
HCT: 39.9 % (ref 36.0–46.0)
Hemoglobin: 12.8 g/dL (ref 12.0–15.0)
Immature Granulocytes: 1 %
Lymphocytes Relative: 2 %
Lymphs Abs: 0.2 10*3/uL — ABNORMAL LOW (ref 0.7–4.0)
MCH: 30.8 pg (ref 26.0–34.0)
MCHC: 32.1 g/dL (ref 30.0–36.0)
MCV: 96.1 fL (ref 80.0–100.0)
Monocytes Absolute: 0.4 10*3/uL (ref 0.1–1.0)
Monocytes Relative: 3 %
Neutro Abs: 14.1 10*3/uL — ABNORMAL HIGH (ref 1.7–7.7)
Neutrophils Relative %: 93 %
Platelets: 197 10*3/uL (ref 150–400)
RBC: 4.15 MIL/uL (ref 3.87–5.11)
RDW: 13 % (ref 11.5–15.5)
WBC: 14.9 10*3/uL — ABNORMAL HIGH (ref 4.0–10.5)
nRBC: 0 % (ref 0.0–0.2)

## 2023-04-03 LAB — BRAIN NATRIURETIC PEPTIDE: B Natriuretic Peptide: 198.7 pg/mL — ABNORMAL HIGH (ref 0.0–100.0)

## 2023-04-03 LAB — RESP PANEL BY RT-PCR (RSV, FLU A&B, COVID)  RVPGX2
Influenza A by PCR: NEGATIVE
Influenza B by PCR: NEGATIVE
Resp Syncytial Virus by PCR: NEGATIVE
SARS Coronavirus 2 by RT PCR: NEGATIVE

## 2023-04-03 LAB — TROPONIN I (HIGH SENSITIVITY)
Troponin I (High Sensitivity): 3 ng/L (ref <18)
Troponin I (High Sensitivity): 3 ng/L (ref <18)

## 2023-04-03 LAB — LIPASE, BLOOD: Lipase: 162 U/L — ABNORMAL HIGH (ref 11–51)

## 2023-04-03 LAB — PROCALCITONIN: Procalcitonin: 8.39 ng/mL

## 2023-04-03 MED ORDER — FUROSEMIDE 40 MG PO TABS
40.0000 mg | ORAL_TABLET | Freq: Every day | ORAL | Status: DC
  Administered 2023-04-04 – 2023-04-05 (×2): 40 mg via ORAL
  Filled 2023-04-03 (×2): qty 1

## 2023-04-03 MED ORDER — LACTATED RINGERS IV BOLUS
1000.0000 mL | Freq: Once | INTRAVENOUS | Status: AC
  Administered 2023-04-03: 1000 mL via INTRAVENOUS

## 2023-04-03 MED ORDER — GUAIFENESIN ER 600 MG PO TB12
600.0000 mg | ORAL_TABLET | Freq: Two times a day (BID) | ORAL | Status: DC
  Administered 2023-04-03 – 2023-04-05 (×5): 600 mg via ORAL
  Filled 2023-04-03 (×5): qty 1

## 2023-04-03 MED ORDER — HYDROCHLOROTHIAZIDE 12.5 MG PO TABS
6.2500 mg | ORAL_TABLET | Freq: Every day | ORAL | Status: DC
  Administered 2023-04-03 – 2023-04-05 (×3): 6.25 mg via ORAL
  Filled 2023-04-03 (×3): qty 1

## 2023-04-03 MED ORDER — BISACODYL 5 MG PO TBEC: 5.0000 mg | DELAYED_RELEASE_TABLET | Freq: Every day | ORAL | Status: DC | PRN

## 2023-04-03 MED ORDER — TRAZODONE HCL 50 MG PO TABS
50.0000 mg | ORAL_TABLET | Freq: Every day | ORAL | Status: DC
  Administered 2023-04-03 – 2023-04-04 (×2): 50 mg via ORAL
  Filled 2023-04-03 (×2): qty 1

## 2023-04-03 MED ORDER — TRAMADOL HCL 50 MG PO TABS
50.0000 mg | ORAL_TABLET | Freq: Once | ORAL | Status: AC
  Administered 2023-04-03: 50 mg via ORAL
  Filled 2023-04-03: qty 1

## 2023-04-03 MED ORDER — BISOPROLOL-HYDROCHLOROTHIAZIDE 5-6.25 MG PO TABS: 1.0000 | ORAL_TABLET | Freq: Every day | ORAL | Status: DC

## 2023-04-03 MED ORDER — ACETAMINOPHEN 650 MG RE SUPP: 650.0000 mg | Freq: Four times a day (QID) | RECTAL | Status: DC | PRN

## 2023-04-03 MED ORDER — ONDANSETRON HCL 4 MG/2ML IJ SOLN
4.0000 mg | Freq: Once | INTRAMUSCULAR | Status: AC
  Administered 2023-04-03: 4 mg via INTRAVENOUS
  Filled 2023-04-03: qty 2

## 2023-04-03 MED ORDER — ONDANSETRON HCL 4 MG PO TABS
4.0000 mg | ORAL_TABLET | Freq: Four times a day (QID) | ORAL | Status: DC | PRN
  Administered 2023-04-05: 4 mg via ORAL
  Filled 2023-04-03: qty 1

## 2023-04-03 MED ORDER — HYDROMORPHONE HCL 1 MG/ML IJ SOLN
0.5000 mg | Freq: Once | INTRAMUSCULAR | Status: AC
  Administered 2023-04-03: 0.5 mg via INTRAVENOUS
  Filled 2023-04-03: qty 0.5

## 2023-04-03 MED ORDER — PROMETHAZINE HCL 25 MG PO TABS: 12.5000 mg | ORAL_TABLET | Freq: Four times a day (QID) | ORAL | Status: DC | PRN

## 2023-04-03 MED ORDER — AZITHROMYCIN 500 MG PO TABS
500.0000 mg | ORAL_TABLET | Freq: Every day | ORAL | Status: DC
  Administered 2023-04-04: 500 mg via ORAL
  Filled 2023-04-03: qty 1

## 2023-04-03 MED ORDER — ALBUTEROL SULFATE (2.5 MG/3ML) 0.083% IN NEBU: 2.5000 mg | INHALATION_SOLUTION | Freq: Four times a day (QID) | RESPIRATORY_TRACT | Status: DC | PRN

## 2023-04-03 MED ORDER — SODIUM CHLORIDE 0.9 % IV SOLN
1.0000 g | INTRAVENOUS | Status: DC
  Administered 2023-04-04: 1 g via INTRAVENOUS
  Filled 2023-04-03: qty 10

## 2023-04-03 MED ORDER — SENNOSIDES-DOCUSATE SODIUM 8.6-50 MG PO TABS: 1.0000 | ORAL_TABLET | Freq: Every evening | ORAL | Status: DC | PRN

## 2023-04-03 MED ORDER — SODIUM CHLORIDE 0.9 % IV SOLN
500.0000 mg | Freq: Once | INTRAVENOUS | Status: AC
  Administered 2023-04-03: 500 mg via INTRAVENOUS
  Filled 2023-04-03: qty 5

## 2023-04-03 MED ORDER — BISOPROLOL FUMARATE 5 MG PO TABS
5.0000 mg | ORAL_TABLET | Freq: Every day | ORAL | Status: DC
  Administered 2023-04-03 – 2023-04-05 (×3): 5 mg via ORAL
  Filled 2023-04-03 (×4): qty 1

## 2023-04-03 MED ORDER — ACETAMINOPHEN 325 MG PO TABS
650.0000 mg | ORAL_TABLET | Freq: Four times a day (QID) | ORAL | Status: DC | PRN
  Administered 2023-04-03 – 2023-04-05 (×3): 650 mg via ORAL
  Filled 2023-04-03 (×3): qty 2

## 2023-04-03 MED ORDER — SODIUM CHLORIDE 0.9 % IV SOLN: INTRAVENOUS | Status: DC

## 2023-04-03 MED ORDER — TRAMADOL HCL 50 MG PO TABS
50.0000 mg | ORAL_TABLET | Freq: Two times a day (BID) | ORAL | Status: DC | PRN
  Administered 2023-04-04: 50 mg via ORAL
  Filled 2023-04-03: qty 1

## 2023-04-03 MED ORDER — IPRATROPIUM-ALBUTEROL 0.5-2.5 (3) MG/3ML IN SOLN
3.0000 mL | Freq: Four times a day (QID) | RESPIRATORY_TRACT | Status: DC
  Administered 2023-04-03: 3 mL via RESPIRATORY_TRACT
  Filled 2023-04-03: qty 3

## 2023-04-03 MED ORDER — ONDANSETRON HCL 4 MG/2ML IJ SOLN: 4.0000 mg | Freq: Four times a day (QID) | INTRAMUSCULAR | Status: DC | PRN

## 2023-04-03 MED ORDER — POLYETHYLENE GLYCOL 3350 17 G PO PACK: 17.0000 g | PACK | Freq: Every day | ORAL | Status: DC | PRN

## 2023-04-03 MED ORDER — FLUTICASONE PROPIONATE 50 MCG/ACT NA SUSP: 1.0000 | Freq: Every day | NASAL | Status: DC | PRN

## 2023-04-03 MED ORDER — ACETAMINOPHEN 500 MG PO TABS: 500.0000 mg | ORAL_TABLET | Freq: Four times a day (QID) | ORAL | Status: DC | PRN

## 2023-04-03 MED ORDER — IOHEXOL 300 MG/ML  SOLN
75.0000 mL | Freq: Once | INTRAMUSCULAR | Status: AC | PRN
  Administered 2023-04-03: 75 mL via INTRAVENOUS

## 2023-04-03 MED ORDER — ENOXAPARIN SODIUM 40 MG/0.4ML IJ SOSY
40.0000 mg | PREFILLED_SYRINGE | INTRAMUSCULAR | Status: DC
  Administered 2023-04-03 – 2023-04-04 (×2): 40 mg via SUBCUTANEOUS
  Filled 2023-04-03 (×2): qty 0.4

## 2023-04-03 MED ORDER — SALONPAS PAIN RELIEF PATCH EX PTCH: 1.0000 | MEDICATED_PATCH | Freq: Every day | CUTANEOUS | Status: DC | PRN

## 2023-04-03 MED ORDER — ROPINIROLE HCL 1 MG PO TABS
1.0000 mg | ORAL_TABLET | Freq: Every day | ORAL | Status: DC
  Administered 2023-04-03: 1 mg via ORAL
  Filled 2023-04-03: qty 1

## 2023-04-03 MED ORDER — ESCITALOPRAM OXALATE 10 MG PO TABS
10.0000 mg | ORAL_TABLET | Freq: Every day | ORAL | Status: DC
  Administered 2023-04-03 – 2023-04-05 (×3): 10 mg via ORAL
  Filled 2023-04-03 (×4): qty 1

## 2023-04-03 MED ORDER — SODIUM CHLORIDE 0.9 % IV SOLN
1.0000 g | Freq: Once | INTRAVENOUS | Status: AC
  Administered 2023-04-03: 1 g via INTRAVENOUS
  Filled 2023-04-03: qty 10

## 2023-04-03 NOTE — ED Provider Notes (Signed)
[facility] Provider Note    Event Date/Time   First MD Initiated Contact with Patient 04/03/23 1221     (approximate)   History   Chief Complaint Nausea and Generalized Body Aches   HPI  Kristen Pennington is a 89 y.o. female with past medical history of hypertension, hyperlipidemia, migraines, and chronic pain syndrome who presents to the ED complaining of nausea and generalized weakness.  Patient reports that she has been feeling increasingly ill over the past couple of days with nausea, generalized weakness, and bodyaches.  She states she has been dry heaving and is concerned she may have eaten something that is making her sick, but denies any associated abdominal pain or diarrhea.  She does endorse a dry cough, is not aware of any fevers and denies difficulty breathing or pain in her chest.  She is not aware of any sick contacts.     Physical Exam   Triage Vital Signs: ED Triage Vitals  Encounter Vitals Group     BP      Systolic BP Percentile      Diastolic BP Percentile      Pulse      Resp      Temp      Temp src      SpO2      Weight      Height      Head Circumference      Peak Flow      Pain Score      Pain Loc      Pain Education      Exclude from Growth Chart     Most recent vital signs: Vitals:   04/03/23 1300 04/03/23 1333  BP:  (!) 145/63  Pulse: 80 81  Resp: 16 15  Temp:  98.4 F (36.9 C)  SpO2: 93% 95%    Constitutional: Alert and oriented. Eyes: Conjunctivae are normal. Head: Atraumatic. Nose: No congestion/rhinnorhea. Mouth/Throat: Mucous membranes are dry. Cardiovascular: Normal rate, regular rhythm. Grossly normal heart sounds.  2+ radial pulses bilaterally. Respiratory: Normal respiratory effort.  No retractions. Lungs CTAB. Gastrointestinal: Soft and nontender. No distention. Musculoskeletal: No lower extremity tenderness nor edema.  Neurologic:  Normal speech and language. No gross focal neurologic  deficits are appreciated.    ED Results / Procedures / Treatments   Labs (all labs ordered are listed, but only abnormal results are displayed) Labs Reviewed  CBC WITH DIFFERENTIAL/PLATELET - Abnormal; Notable for the following components:      Result Value   WBC 14.9 (*)    Neutro Abs 14.1 (*)    Lymphs Abs 0.2 (*)    Abs Immature Granulocytes 0.09 (*)    All other components within normal limits  COMPREHENSIVE METABOLIC PANEL - Abnormal; Notable for the following components:   AST 49 (*)    Total Bilirubin 1.6 (*)    GFR, Estimated 55 (*)    All other components within normal limits  LIPASE, BLOOD - Abnormal; Notable for the following components:   Lipase 162 (*)    All other components within normal limits  BRAIN NATRIURETIC PEPTIDE - Abnormal; Notable for the following components:   B Natriuretic Peptide 198.7 (*)    All other components within normal limits  RESP PANEL BY RT-PCR (RSV, FLU A&B, COVID)  RVPGX2  EXPECTORATED SPUTUM ASSESSMENT W GRAM STAIN, RFLX TO RESP C  PROCALCITONIN  URINALYSIS, ROUTINE W REFLEX MICROSCOPIC  TROPONIN I (HIGH SENSITIVITY)  EKG  ED ECG REPORT I, Chesley Noon, the attending physician, personally viewed and interpreted this ECG.   Date: 04/03/2023  EKG Time: 12:26  Rate: 89  Rhythm: normal sinus rhythm  Axis: Normal  Intervals:none  ST&T Change: None  RADIOLOGY Chest x-ray reviewed and interpreted by me with bilateral lower lobe infiltrates.  PROCEDURES:  Critical Care performed: Yes, see critical care procedure note(s)  .Critical Care  Performed by: Chesley Noon, MD Authorized by: Chesley Noon, MD   Critical care provider statement:    Critical care time (minutes):  30   Critical care time was exclusive of:  Separately billable procedures and treating other patients and teaching time   Critical care was necessary to treat or prevent imminent or life-threatening deterioration of the following conditions:   Respiratory failure   Critical care was time spent personally by me on the following activities:  Development of treatment plan with patient or surrogate, discussions with consultants, evaluation of patient's response to treatment, examination of patient, ordering and review of laboratory studies, ordering and review of radiographic studies, ordering and performing treatments and interventions, pulse oximetry, re-evaluation of patient's condition and review of old charts   I assumed direction of critical care for this patient from another provider in my specialty: no     Care discussed with: admitting provider      MEDICATIONS ORDERED IN ED: Medications  cefTRIAXone (ROCEPHIN) 1 g in sodium chloride 0.9 % 100 mL IVPB (has no administration in time range)  enoxaparin (LOVENOX) injection 40 mg (has no administration in time range)  acetaminophen (TYLENOL) tablet 650 mg (has no administration in time range)    Or  acetaminophen (TYLENOL) suppository 650 mg (has no administration in time range)  ondansetron (ZOFRAN) tablet 4 mg (has no administration in time range)    Or  ondansetron (ZOFRAN) injection 4 mg (has no administration in time range)  bisacodyl (DULCOLAX) EC tablet 5 mg (has no administration in time range)  senna-docusate (Senokot-S) tablet 1 tablet (has no administration in time range)  cefTRIAXone (ROCEPHIN) 1 g in sodium chloride 0.9 % 100 mL IVPB (has no administration in time range)  guaiFENesin (MUCINEX) 12 hr tablet 600 mg (has no administration in time range)  azithromycin (ZITHROMAX) tablet 500 mg (has no administration in time range)  ipratropium-albuterol (DUONEB) 0.5-2.5 (3) MG/3ML nebulizer solution 3 mL (has no administration in time range)  albuterol (PROVENTIL) (2.5 MG/3ML) 0.083% nebulizer solution 2.5 mg (has no administration in time range)  0.9 %  sodium chloride infusion (has no administration in time range)  acetaminophen (TYLENOL) tablet 500 mg (has no  administration in time range)  traMADol (ULTRAM) tablet 50 mg (has no administration in time range)  bisoprolol-hydrochlorothiazide (ZIAC) 5-6.25 MG per tablet 1 tablet (has no administration in time range)  furosemide (LASIX) tablet 40 mg (has no administration in time range)  escitalopram (LEXAPRO) tablet 10 mg (has no administration in time range)  traZODone (DESYREL) tablet 50 mg (has no administration in time range)  polyethylene glycol powder (GLYCOLAX/MIRALAX) container 17 g (has no administration in time range)  rOPINIRole (REQUIP) tablet 1 mg (has no administration in time range)  fluticasone (FLONASE) 50 MCG/ACT nasal spray 1 spray (has no administration in time range)  promethazine (PHENERGAN) tablet 12.5 mg (has no administration in time range)  Salonpas Pain Relief Patch PTCH 1 patch (has no administration in time range)  lactated ringers bolus 1,000 mL (1,000 mLs Intravenous New Bag/Given 04/03/23 1243)  ondansetron (ZOFRAN)  injection 4 mg (4 mg Intravenous Given 04/03/23 1315)  azithromycin (ZITHROMAX) 500 mg in sodium chloride 0.9 % 250 mL IVPB (500 mg Intravenous New Bag/Given 04/03/23 1402)  traMADol (ULTRAM) tablet 50 mg (50 mg Oral Given 04/03/23 1447)     IMPRESSION / MDM / ASSESSMENT AND PLAN / ED COURSE  I reviewed the triage vital signs and the nursing notes.                              89 y.o. female with past medical history of hypertension, hyperlipidemia, grains, and chronic pain syndrome who presents to the ED with increasing generalized weakness, malaise, cough, and nausea over the past couple of days.  Patient's presentation is most consistent with acute presentation with potential threat to life or bodily function.  Differential diagnosis includes, but is not limited to, viral syndrome, pneumonia, UTI, dehydration, electrolyte abnormality, AKI.  Patient nontoxic-appearing and in no acute distress, vital signs remarkable for oxygen saturations dipping into the  mid 80s, patient placed on 2 L nasal cannula with improvement.  Chest x-ray concerning for bilateral lower lobe pneumonia and we will start on IV Rocephin and azithromycin, COVID testing is negative.  With reassuring vital signs, no findings concerning for sepsis.  Labs show leukocytosis consistent with pneumonia, but no significant anemia, electrolyte abnormality, or AKI.  She does have elevation in lipase but no epigastric tenderness to suggest acute pancreatitis.  LFTs are otherwise unremarkable.  Case discussed with hospitalist for admission.      FINAL CLINICAL IMPRESSION(S) / ED DIAGNOSES   Final diagnoses:  Pneumonia due to infectious organism, unspecified laterality, unspecified part of lung  Acute respiratory failure with hypoxia (HCC)     Rx / DC Orders   ED Discharge Orders     None        Note:  This document was prepared using Dragon voice recognition software and may include unintentional dictation errors.   Chesley Noon, MD 04/03/23 [phone]

## 2023-04-03 NOTE — ED Triage Notes (Signed)
Pt to ED via [facility] from [facility] living for c/o N/V and body aches. Pt states she may have eaten something that made her sick. EMS called by neighbor who stated pt had been in bed since Thursday. Pt has hx HTN, unsure if pt compliant with meds.  Cbg 110  Bp 178/98 Temp 98.1 HR 93

## 2023-04-03 NOTE — H&P (Signed)
History and Physical    Kristen Pennington ZOX:[id number] DOB: 1928/11/18 DOA: 04/03/2023  PCP: Sallyanne Kuster, NP (Confirm with patient/family/NH records and if not entered, this has to be entered at [facility] point of entry) Patient coming from: Assisted living  I have personally briefly reviewed patient's old medical records in [facility]  Chief Complaint: Pain everywhere, nausea, cough  HPI: Kristen Pennington is a 89 y.o. female with medical history significant of chronic pain and narcotic dependence, HTN, HLD, OSA noncompliant with CPAP, anxiety/depression, CKD stage IIIa, presented with multiple complaints including poorly localized pain, nausea and cough.  Patient has a chronic back pain but 3 days ago he developed " more pain" appears to be migrating to upper back and sometimes epigastric area associated with nausea but denies any vomiting and has been feeling chills since yesterday and developed a productive cough " feeling the phlegm in the throat but cannot cough up" denies any orthopnea no wheezing no chest pains.  She lives by herself, it is delivered frozen meals ordered by daughter.  ED Course: Temperature 98.4, nontachycardic nonhypotensive O2 saturation 88% room air, stabilized on 2 L 95%.  Chest x-ray appeared to have bilateral lower lobe infiltrates.  WBC 14.9, hemoglobin 12.8, creatinine 0.9, K3.7, glucose 87.  Patient was given ceftriaxone and azithromycin and breathing treatment.  Review of Systems: As per HPI otherwise 14 point review of systems negative.    Past Medical History:  Diagnosis Date   Arthritis    Atrophic vaginitis 03/06/2013   Last Assessment & Plan:  She will plan to continue estradiol vaginal cream.  Prescription was rewritten stipulating use of the generic product.    Atypical migraine 03/06/2013   Last Assessment & Plan:  Since she rarely takes sumatriptan, we will discontinue it. Continue sparing use of OTC migraine products.    Avitaminosis D  03/06/2013   Last Assessment & Plan:  Recheck vitamin D level. Plan to continue vitamin D supplementation.    Ceratitis 08/01/2013   Last Assessment & Plan:  Because of the expense, she will discontinue Restasis. She will use over-the-counter moisturizing eyedrops and liquid gel.    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    Cystocele, midline 03/06/2013   Demoralization and apathy 03/06/2013   Last Assessment & Plan:  Since she has been taking bupropion only once a day, I will write the prescription with the correct instructions and correct quantity. She has done well on this for years and she is encouraged to take it regularly    Depression    Environmental allergies    Epistaxis    Essential (primary) hypertension 05/17/2013   Last Assessment & Plan:  Her blood pressure is well-controlled. We will plan to continue hydrochlorothiazide and benazepril.  Both are generic and should be affordable on her health plan.    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 We will continue that regimen.    Sinusitis    Sleep apnea    CPAP    Past Surgical History:  Procedure  Laterality Date   COLONOSCOPY  05-03-15   COLONOSCOPY WITH PROPOFOL N/A 05/03/2015   Procedure: COLONOSCOPY WITH PROPOFOL;  Surgeon: Midge Minium, MD;  Location: [facility];  Service: Endoscopy;  Laterality: N/A;  CPAP   DILATION AND CURETTAGE OF UTERUS  1970   ENDOMETRIAL BIOPSY     EYE SURGERY Right 2017   POLYPECTOMY  05/03/2015   Procedure: POLYPECTOMY;  Surgeon: Midge Minium, MD;   Location: [facility];  Service: Endoscopy;;   ROTATOR CUFF REPAIR  2007   SPINE SURGERY       reports that she has never smoked. She has never been exposed to tobacco smoke. She has never used smokeless tobacco. She reports that she does not drink alcohol and does not use drugs.  Allergies  Allergen Reactions   Celebrex [Celecoxib] Rash    Family History  Problem Relation Age of Onset   Arthritis Mother    Alzheimer's disease Father    Heart disease Sister    Cancer Brother    Ovarian cancer Neg Hx    Breast cancer Neg Hx    Colon cancer Neg Hx    Diabetes Neg Hx      Prior to Admission medications   Medication Sig Start Date End Date Taking? Authorizing Provider  acetaminophen (TYLENOL) 500 MG tablet Take 500 mg by mouth every 6 (six) hours as needed.   Yes [provider]  bisoprolol-hydrochlorothiazide (ZIAC) 5-6.25 MG tablet Take 1 tablet by mouth daily. 11/30/22  Yes Abernathy, Arlyss Repress, NP  Cholecalciferol (VITAMIN D3) 5000 units TABS Take 5,000 Units by mouth daily.   Yes [provider]  escitalopram (LEXAPRO) 10 MG tablet TAKE 1 TABLET BY MOUTH DAILY 03/02/23  Yes Abernathy, Alyssa, NP  fluticasone (FLONASE) 50 MCG/ACT nasal spray 1 SPRAY IN EACH NOSTRIL ONCE DAILY AS NEEDED Patient taking differently: Place 1 spray into both nostrils daily. 1 SPRAY IN EACH NOSTRIL ONCE DAILY AS NEEDED 10/29/21  Yes Abernathy, Alyssa, NP  furosemide (LASIX) 40 MG tablet Take 1 tablet (40 mg total) by mouth daily. 03/05/23  Yes Abernathy, Alyssa, NP  magnesium oxide (MAG-OX) 400 MG tablet Take 1 tablet (400 mg total) by mouth at bedtime. At bedtime 12/28/22  Yes Abernathy, Alyssa, NP  Menthol-Methyl Salicylate (SALONPAS PAIN RELIEF PATCH) PTCH Apply 1 patch topically daily as needed (Pain).   Yes [provider]  polyethylene glycol powder (GLYCOLAX/MIRALAX) 17 GM/SCOOP powder Take 17 g by mouth daily as needed for mild constipation or moderate constipation.  03/05/22  Yes Abernathy, Arlyss Repress, NP  promethazine (PHENERGAN) 12.5 MG tablet Take 1 tablet (12.5 mg total) by mouth every 6 (six) hours as needed for nausea or vomiting. 08/03/22  Yes Abernathy, Arlyss Repress, NP  rOPINIRole (REQUIP) 1 MG tablet Take 1 tablet by mouth in am and 2 tablets by mouth at bedtime 11/30/22  Yes Abernathy, Alyssa, NP  traMADol (ULTRAM) 50 MG tablet Take 1 tablet (50 mg total) by mouth in the morning, at noon, and at bedtime. 02/18/23  Yes Abernathy, Arlyss Repress, NP  traZODone (DESYREL) 50 MG tablet Take 1 tablet (50 mg total) by mouth at bedtime. 03/05/23  Yes Abernathy, Arlyss Repress, NP  triamcinolone cream (KENALOG) 0.1 % Apply 1 Application topically 2 (two) times daily. To rash area until resolved. 01/04/23  Yes Abernathy, Arlyss Repress, NP  cyclobenzaprine (FLEXERIL) 10 MG tablet Take 1 tablet (10 mg total) by mouth at bedtime as needed for muscle spasms. Patient not taking: Reported on 04/03/2023 11/30/22   Sallyanne Kuster,  NP  estradiol (ESTRACE) 0.1 MG/GM vaginal cream USE ONE APPLICATORFUL AS DIRECTED ONCE WEEKLY AS NEEDED Patient not taking: Reported on 04/03/2023 01/06/23   Sallyanne Kuster, NP  hydrALAZINE (APRESOLINE) 10 MG tablet Take 1 tablet (10 mg total) by mouth daily as needed. For high BP. Patient not taking: Reported on 04/03/2023 02/08/23   Carlean Jews, PA-C    Physical Exam: Vitals:   04/03/23 1230 04/03/23 1300 04/03/23 1333  BP:   (!) 145/63  Pulse: 85 80 81  Resp: 17 16 15   Temp:   98.4 F (36.9 C)  SpO2: 93% 93% 95%  Weight: 56.7 kg    Height: 5\' 4"  (1.626 m)      Constitutional: NAD, calm, comfortable Vitals:   04/03/23 1230 04/03/23 1300 04/03/23 1333  BP:   (!) 145/63  Pulse: 85 80 81  Resp: 17 16 15   Temp:   98.4 F (36.9 C)  SpO2: 93% 93% 95%  Weight: 56.7 kg    Height: 5\' 4"  (1.626 m)     Eyes: PERRL, lids and conjunctivae normal ENMT: Mucous membranes are moist. Posterior pharynx clear of any exudate or lesions.Normal dentition.  Neck: normal,  supple, no masses, no thyromegaly Respiratory: clear to auscultation bilaterally, no wheezing, coarse crackles on bilateral lower fields, increasing respiratory effort. No accessory muscle use.  Cardiovascular: Regular rate and rhythm, no murmurs / rubs / gallops. No extremity edema. 2+ pedal pulses. No carotid bruits.  Abdomen: Mild tenderness on epigastric area no rebound no guarding, no masses palpated. No hepatosplenomegaly. Bowel sounds positive.  Musculoskeletal: no clubbing / cyanosis. No joint deformity upper and lower extremities. Good ROM, no contractures. Normal muscle tone.  Skin: no rashes, lesions, ulcers. No induration Neurologic: CN 2-12 grossly intact. Sensation intact, DTR normal. Strength 5/5 in all 4.  Psychiatric: Normal judgment and insight. Alert and oriented x 3. Normal mood.     Labs on Admission: I have personally reviewed following labs and imaging studies  CBC: Recent Labs  Lab 04/03/23 1231  WBC 14.9*  NEUTROABS 14.1*  HGB 12.8  HCT 39.9  MCV 96.1  PLT 197   Basic Metabolic Panel: Recent Labs  Lab 04/03/23 1231  NA 137  K 3.7  CL 104  CO2 25  GLUCOSE 87  BUN 22  CREATININE 0.97  CALCIUM 9.0   GFR: Estimated Creatinine Clearance: 32 mL/min (by C-G formula based on SCr of 0.97 mg/dL). Liver Function Tests: Recent Labs  Lab 04/03/23 1231  AST 49*  ALT 28  ALKPHOS 54  BILITOT 1.6*  PROT 7.0  ALBUMIN 3.6   Recent Labs  Lab 04/03/23 1231  LIPASE 162*   No results for input(s): "AMMONIA" in the last 168 hours. Coagulation Profile: No results for input(s): "INR", "PROTIME" in the last 168 hours. Cardiac Enzymes: No results for input(s): "CKTOTAL", "CKMB", "CKMBINDEX", "TROPONINI" in the last 168 hours. BNP (last 3 results) Recent Labs    03/16/23 1114  PROBNP 670   HbA1C: No results for input(s): "HGBA1C" in the last 72 hours. CBG: No results for input(s): "GLUCAP" in the last 168 hours. Lipid Profile: No results for  input(s): "CHOL", "HDL", "LDLCALC", "TRIG", "CHOLHDL", "LDLDIRECT" in the last 72 hours. Thyroid Function Tests: No results for input(s): "TSH", "T4TOTAL", "FREET4", "T3FREE", "THYROIDAB" in the last 72 hours. Anemia Panel: No results for input(s): "VITAMINB12", "FOLATE", "FERRITIN", "TIBC", "IRON", "RETICCTPCT" in the last 72 hours. Urine analysis:    Component Value Date/Time   COLORURINE AMBER (A) 03/03/2023  0013   APPEARANCEUR HAZY (A) 03/03/2023 0013   APPEARANCEUR Clear 04/15/2022 1622   LABSPEC 1.020 03/03/2023 0013   PHURINE 6.0 03/03/2023 0013   GLUCOSEU NEGATIVE 03/03/2023 0013   HGBUR NEGATIVE 03/03/2023 0013   BILIRUBINUR NEGATIVE 03/03/2023 0013   BILIRUBINUR Negative 09/29/2022 1113   BILIRUBINUR Negative 04/15/2022 1622   KETONESUR NEGATIVE 03/03/2023 0013   PROTEINUR 100 (A) 03/03/2023 0013   UROBILINOGEN 0.2 09/29/2022 1113   NITRITE NEGATIVE 03/03/2023 0013   LEUKOCYTESUR MODERATE (A) 03/03/2023 0013    Radiological Exams on Admission: DG Chest 2 View  Result Date: 04/03/2023 CLINICAL DATA:  Cough. EXAM: CHEST - 2 VIEW COMPARISON:  03/03/2023. FINDINGS: There are increased interstitial markings throughout bilateral lungs with lower lobe predominance. No acute consolidation or major lung collapse. There are probable patchy atelectatic changes at the lung bases. Bilateral costophrenic angles are clear. Stable cardio-mediastinal silhouette. Aortic arch calcifications noted. No acute osseous abnormalities. The soft tissues are within normal limits. IMPRESSION: *Increased interstitial markings throughout bilateral lungs with lower lobe predominance. Findings may represent pulmonary edema or atypical pneumonia. Electronically Signed   By: Jules Schick M.D.   On: 04/03/2023 13:17    EKG: Independently reviewed.  Sinus rhythm, no acute ST changes.  Assessment/Plan Principal Problem:   Pneumonia Active Problems:   CAP (community acquired pneumonia)   Acute respiratory  failure with hypoxia (HCC)  (please populate well all problems here in Problem List. (For example, if patient is on BP meds at home and you resume or decide to hold them, it is a problem that needs to be her. Same for CAD, COPD, HLD and so on)  Acute hypoxic respiratory failure -Secondary to bilateral pneumonia -Continue ceftriaxone and azithromycin -Sputum culture, strep antigen  -Breathing treatment -Incentive spirometry and flutter valve -Speech evaluation to stratify aspiration risk -Other DDx, no history of CHF and normal echo last year, proBNP mild elevation, patient appears to be euvolemic.  Recheck x-ray tomorrow and continue p.o. diuresis. Check Tropx2  Epigastric pain -Elevated lipase, will check CT abdomen pelvis with contrast -IV fluid x 12 hours for kidney protection  HTN -Restart Lasix tomorrow, as patient is on IVF -Continue bisoprolol and hydrochlorothiazide  Anxiety/depression -Mentation at baseline, continue Lexapro  Deconditioning -PT evaluation   DVT prophylaxis: Lovenoc Code Status: DNR Family Communication: None at bedside Disposition Plan: Patient is sick with pneumonia hypoxic failure and deconditioning, requiring IV antibiotics, expect more than 2 midnight hospital Consults called: None Admission status: Tele admit   Emeline General MD [facility] Pager [phone]  04/03/2023, 3:16 PM

## 2023-04-03 NOTE — Plan of Care (Signed)

## 2023-04-04 ENCOUNTER — Inpatient Hospital Stay: Payer: Medicare HMO

## 2023-04-04 DIAGNOSIS — M545 Low back pain, unspecified: Secondary | ICD-10-CM | POA: Diagnosis not present

## 2023-04-04 DIAGNOSIS — G8929 Other chronic pain: Secondary | ICD-10-CM

## 2023-04-04 DIAGNOSIS — G2581 Restless legs syndrome: Secondary | ICD-10-CM | POA: Diagnosis not present

## 2023-04-04 DIAGNOSIS — J189 Pneumonia, unspecified organism: Principal | ICD-10-CM

## 2023-04-04 DIAGNOSIS — J9601 Acute respiratory failure with hypoxia: Secondary | ICD-10-CM

## 2023-04-04 LAB — BASIC METABOLIC PANEL
Anion gap: 6 (ref 5–15)
BUN: 21 mg/dL (ref 8–23)
CO2: 25 mmol/L (ref 22–32)
Calcium: 8.1 mg/dL — ABNORMAL LOW (ref 8.9–10.3)
Chloride: 107 mmol/L (ref 98–111)
Creatinine, Ser: 0.84 mg/dL (ref 0.44–1.00)
GFR, Estimated: >60 mL/min (ref >60)
Glucose, Bld: 79 mg/dL (ref 70–99)
Potassium: 3.9 mmol/L (ref 3.5–5.1)
Sodium: 138 mmol/L (ref 135–145)

## 2023-04-04 LAB — CBC
HCT: 32.1 % — ABNORMAL LOW (ref 36.0–46.0)
Hemoglobin: 10.3 g/dL — ABNORMAL LOW (ref 12.0–15.0)
MCH: 30.7 pg (ref 26.0–34.0)
MCHC: 32.1 g/dL (ref 30.0–36.0)
MCV: 95.5 fL (ref 80.0–100.0)
Platelets: 160 10*3/uL (ref 150–400)
RBC: 3.36 MIL/uL — ABNORMAL LOW (ref 3.87–5.11)
RDW: 13.2 % (ref 11.5–15.5)
WBC: 9 10*3/uL (ref 4.0–10.5)
nRBC: 0 % (ref 0.0–0.2)

## 2023-04-04 LAB — LIPASE, BLOOD: Lipase: 97 U/L — ABNORMAL HIGH (ref 11–51)

## 2023-04-04 MED ORDER — AZITHROMYCIN 500 MG PO TABS
500.0000 mg | ORAL_TABLET | Freq: Every day | ORAL | Status: DC
  Administered 2023-04-05: 500 mg via ORAL
  Filled 2023-04-04: qty 1

## 2023-04-04 MED ORDER — IPRATROPIUM-ALBUTEROL 0.5-2.5 (3) MG/3ML IN SOLN
3.0000 mL | Freq: Two times a day (BID) | RESPIRATORY_TRACT | Status: DC
  Administered 2023-04-04: 3 mL via RESPIRATORY_TRACT
  Filled 2023-04-04: qty 3

## 2023-04-04 MED ORDER — AMOXICILLIN-POT CLAVULANATE 875-125 MG PO TABS
1.0000 | ORAL_TABLET | Freq: Two times a day (BID) | ORAL | Status: DC
  Administered 2023-04-05: 1 via ORAL
  Filled 2023-04-04: qty 1

## 2023-04-04 MED ORDER — IPRATROPIUM-ALBUTEROL 0.5-2.5 (3) MG/3ML IN SOLN: 3.0000 mL | Freq: Four times a day (QID) | RESPIRATORY_TRACT | Status: DC | PRN

## 2023-04-04 MED ORDER — TRAMADOL HCL 50 MG PO TABS
50.0000 mg | ORAL_TABLET | Freq: Four times a day (QID) | ORAL | Status: DC | PRN
  Administered 2023-04-04 – 2023-04-05 (×2): 50 mg via ORAL
  Filled 2023-04-04 (×2): qty 1

## 2023-04-04 MED ORDER — ROPINIROLE HCL 1 MG PO TABS
1.0000 mg | ORAL_TABLET | Freq: Two times a day (BID) | ORAL | Status: DC
  Administered 2023-04-04 – 2023-04-05 (×2): 1 mg via ORAL
  Filled 2023-04-04 (×2): qty 1

## 2023-04-04 NOTE — Progress Notes (Signed)
Physical Therapy Evaluation Patient Details Name: Kristen Pennington MRN: [id number] DOB: 1929-01-31 Today's Date: 04/04/2023  History of Present Illness  Pt is a 89 y/o female admitted secondary to pain, nausea and cough. Pt found to have CAP. PMH including but not limited to chronic pain and narcotic dependence, HTN, HLD, OSA noncompliant with CPAP, anxiety/depression, CKD stage IIIa.   Clinical Impression  Pt presented supine in bed with HOB elevated, awake and willing to participate in therapy session. Prior to admission, pt reported that she was at a mod I to independent level with all functional mobility and ADLs. She reported that she ambulates with use of a cane. She does not drive but has a neighbor that will transport her to/from doctor's appointments. Pt lives alone in a single level apartment. At the time of evaluation, pt able to complete bed mobility at mod I, transfers with supervision and ambulated 30' within her room with CGA and no AD. Pt with no reports of pain or discomfort throughout. PT will continue to follow-up with pt while admitted to continue to progress mobility as tolerated and to ensure a safe d/c home when medically appropriate.     If plan is discharge home, recommend the following: Assist for transportation;Help with stairs or ramp for entrance   Can travel by private vehicle        Equipment Recommendations None recommended by PT  Recommendations for Other Services       Functional Status Assessment Patient has had a recent decline in their functional status and demonstrates the ability to make significant improvements in function in a reasonable and predictable amount of time.     Precautions / Restrictions Precautions Precautions: Fall Restrictions Weight Bearing Restrictions: No      Mobility  Bed Mobility Overal bed mobility: Modified Independent                  Transfers Overall transfer level: Needs assistance Equipment used:  None Transfers: Sit to/from Stand Sit to Stand: Supervision           General transfer comment: no physical assistance required, pt steady with transitional movement    Ambulation/Gait Ambulation/Gait assistance: Contact guard assist Gait Distance (Feet): 30 Feet Assistive device: None Gait Pattern/deviations: Step-through pattern, Drifts right/left Gait velocity: decreased     General Gait Details: pt with mild instability and occasionally reaching for support surfaces with one UE; however, no overt LOB or need for physical assistance, min guard for safety  Stairs            Wheelchair Mobility     Tilt Bed    Modified Rankin (Stroke Patients Only)       Balance Overall balance assessment: Needs assistance Sitting-balance support: Feet supported Sitting balance-Leahy Scale: Good     Standing balance support: During functional activity, No upper extremity supported Standing balance-Leahy Scale: Fair                               Pertinent Vitals/Pain Pain Assessment Pain Assessment: No/denies pain    Home Living Family/patient expects to be discharged to:: Private residence Living Arrangements: Alone Available Help at Discharge: Family;Friend(s);Neighbor;Available 24 hours/day Type of Home: Apartment Home Access: Level entry       Home Layout: One level Home Equipment: Cane - single point      Prior Function Prior Level of Function : Independent/Modified Independent  Mobility Comments: mod I with a cane PRN ADLs Comments: independent. neighbor drives her to her doctor's appointments     Extremity/Trunk Assessment   Upper Extremity Assessment Upper Extremity Assessment: Defer to OT evaluation    Lower Extremity Assessment Lower Extremity Assessment: Overall WFL for tasks assessed       Communication   Communication Communication: No apparent difficulties  Cognition Arousal: Alert Behavior During Therapy:  WFL for tasks assessed/performed Overall Cognitive Status: Within Functional Limits for tasks assessed                                          General Comments      Exercises     Assessment/Plan    PT Assessment Patient needs continued PT services  PT Problem List Decreased balance;Decreased mobility;Decreased coordination       PT Treatment Interventions DME instruction;Gait training;Stair training;Functional mobility training;Therapeutic activities;Therapeutic exercise;Balance training;Neuromuscular re-education;Patient/family education    PT Goals (Current goals can be found in the Care Plan section)  Acute Rehab PT Goals Patient Stated Goal: return home PT Goal Formulation: With patient Time For Goal Achievement: 04/18/23 Potential to Achieve Goals: Good    Frequency Min 1X/week     Co-evaluation               AM-PAC PT "6 Clicks" Mobility  Outcome Measure Help needed turning from your back to your side while in a flat bed without using bedrails?: None Help needed moving from lying on your back to sitting on the side of a flat bed without using bedrails?: None Help needed moving to and from a bed to a chair (including a wheelchair)?: None Help needed standing up from a chair using your arms (e.g., wheelchair or bedside chair)?: None Help needed to walk in hospital room?: A Little Help needed climbing 3-5 steps with a railing? : A Little 6 Click Score: 22    End of Session   Activity Tolerance: Patient tolerated treatment well Patient left: in bed;with call bell/phone within reach;with bed alarm set Nurse Communication: Mobility status PT Visit Diagnosis: Unsteadiness on feet (R26.81)    Time: 4132-4401 PT Time Calculation (min) (ACUTE ONLY): 19 min   Charges:   PT Evaluation $PT Eval Low Complexity: 1 Low   PT General Charges $$ ACUTE PT VISIT: 1 Visit         Arletta Bale, DPT  Acute Rehabilitation Services Office  [phone]   Kristen Pennington 04/04/2023, 11:20 AM

## 2023-04-04 NOTE — Plan of Care (Signed)

## 2023-04-04 NOTE — Evaluation (Signed)
Clinical/Bedside Swallow Evaluation Patient Details  Name: RALPHINE CAMMER MRN: [id number] Date of Birth: 1929/05/21  Today's Date: 04/04/2023 Time: SLP Start Time (ACUTE ONLY): 1128 SLP Stop Time (ACUTE ONLY): 1145 SLP Time Calculation (min) (ACUTE ONLY): 17 min  Past Medical History:  Past Medical History:  Diagnosis Date   Arthritis    Atrophic vaginitis 03/06/2013   Last Assessment & Plan:  She will plan to continue estradiol vaginal cream.  Prescription was rewritten stipulating use of the generic product.    Atypical migraine 03/06/2013   Last Assessment & Plan:  Since she rarely takes sumatriptan, we will discontinue it. Continue sparing use of OTC migraine products.    Avitaminosis D 03/06/2013   Last Assessment & Plan:  Recheck vitamin D level. Plan to continue vitamin D supplementation.    Ceratitis 08/01/2013   Last Assessment & Plan:  Because of the expense, she will discontinue Restasis. She will use over-the-counter moisturizing eyedrops and liquid gel.    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    Cystocele, midline 03/06/2013   Demoralization and apathy 03/06/2013   Last Assessment & Plan:  Since she has been taking bupropion only once a day, I will write the prescription with the correct instructions and correct quantity. She has done well on this for years and she is encouraged to take it regularly    Depression    Environmental allergies    Epistaxis    Essential (primary) hypertension 05/17/2013   Last Assessment & Plan:  Her blood pressure is well-controlled. We will plan to continue hydrochlorothiazide and benazepril.  Both are generic and should be affordable on her health plan.    GERD (gastroesophageal reflux disease)     Hemorrhoid    Inflammation of sacroiliac joint (HCC) 03/06/2013   Last Assessment & Plan:  She is doing well on her present regimen of fentanyl patch supplemented with sparing use of tramadol/acetaminophen. We will continue that regimen.    Sinusitis    Sleep apnea    CPAP   Past Surgical History:  Past Surgical History:  Procedure Laterality Date   COLONOSCOPY  05-03-15   COLONOSCOPY WITH PROPOFOL N/A 05/03/2015   Procedure: COLONOSCOPY WITH PROPOFOL;  Surgeon: Midge Minium, MD;  Location: [facility];  Service: Endoscopy;  Laterality: N/A;  CPAP   DILATION AND CURETTAGE OF UTERUS  1970   ENDOMETRIAL BIOPSY     EYE SURGERY Right 2017   POLYPECTOMY  05/03/2015   Procedure: POLYPECTOMY;  Surgeon: Midge Minium, MD;  Location: [facility];  Service: Endoscopy;;   ROTATOR CUFF REPAIR  2007   SPINE SURGERY     HPI:  Pt is a 89 y/o female admitted secondary to pain, nausea and cough. Pt found to have CAP. PMHx,  including but not limited to, chronic pain and narcotic dependence, HTN, HLD, OSA noncompliant with CPAP, anxiety/depression, CKD stage IIIa, and memory impairment.    Assessment / Plan / Recommendation  Clinical Impression  Pt seen for clinical swallowing evaluation. Pt alert and cooperative. HOH. Mild confusion noted. Oral motor examination unremarkable with exception of missing dentition (reports owning upper partial, but it is at home and broken). Pt presents with a grossly intact oral swallow c/b mildly  prolonged, but functional, mastication of solids. Pharyngeal swallow appeared [facility] per clinical assessment. Concern for esophageal dysphagia given complaints of belching with POs and globus sensation with solids and pills in setting of GERD and advanced age. Eructation observed; however, globus sensation was not reproduced on today's evaluation. Recommend diet downgrade to mech soft diet with thin liquids with safe swallowing strategies/aspiration precautions/reflux  precautions as outlined below. SLP to f/u per POC for diet tolerance. Consideration for further GI work up recommended if pt's complaints persist.  SLP Visit Diagnosis: Dysphagia, unspecified (R13.10)    Aspiration Risk  Mild aspiration risk    Diet Recommendation Dysphagia 3 (Mech soft);Thin liquid    Liquid Administration via: Spoon;Cup;Straw Medication Administration: Crushed with puree Supervision: Patient able to self feed Compensations: Minimize environmental distractions;Slow rate;Small sips/bites;Follow solids with liquid (masticate solids thoroughly) Postural Changes: Seated upright at 90 degrees (remain upright at least 60 minutes after meals)    Other  Recommendations Recommended Consults: Consider GI evaluation Oral Care Recommendations: Oral care QID;Patient independent with oral care (set up)    Recommendations for follow up therapy are one component of a multi-disciplinary discharge planning process, led by the attending physician.  Recommendations may be updated based on patient status, additional functional criteria and insurance authorization.  Follow up Recommendations No SLP follow up         Functional Status Assessment Patient has had a recent decline in their functional status and demonstrates the ability to make significant improvements in function in a reasonable and predictable amount of time.  Frequency and Duration    1 week       Prognosis Prognosis for improved oropharyngeal function: Fair Barriers to Reach Goals: Cognitive deficits;Time post onset      Swallow Study   General HPI: Pt is a 89 y/o female admitted secondary to pain, nausea and cough. Pt found to have CAP. PMHx,  including but not limited to, chronic pain and narcotic dependence, HTN, HLD, OSA noncompliant with CPAP, anxiety/depression, CKD stage IIIa, and memory impairment. Type of Study: Bedside Swallow Evaluation Diet Prior to this Study: Regular;Thin liquids (Level 0) Temperature  Spikes Noted: No Respiratory Status: Room air History of Recent Intubation: No Behavior/Cognition: Alert;Cooperative;Distractible Oral Cavity Assessment: Within Functional Limits Oral Care Completed by SLP: Yes Oral Cavity - Dentition: Missing dentition (has upper partial; unable to wear as it is as home and broken per pt) Self-Feeding Abilities: Able to feed self Patient Positioning: Upright in bed Baseline Vocal Quality: Normal Volitional Cough: Strong Volitional Swallow: Able to elicit    Oral/Motor/Sensory Function Overall Oral Motor/Sensory Function: Within functional limits   Ice Chips Ice chips: Not tested   Thin Liquid Thin Liquid: Within functional limits Presentation: Cup;Straw    Nectar Thick Nectar Thick Liquid: Not tested   Honey Thick Honey Thick Liquid: Not tested   Puree Puree: Within functional limits Presentation: Self Fed;Spoon   Solid     Solid: Within functional limits Presentation: Self Fed Other Comments: mildly prolonged, but functional, mastication     Clyde Canterbury, M.S., CCC-SLP Speech-Language Pathologist [facility] [phone] Arnette Felts)  Woodroe Chen 04/04/2023,12:10 PM

## 2023-04-04 NOTE — Progress Notes (Signed)
[facility]  - [place] [facility]   PATIENT NAME: Kristen Pennington    MR#:  [id number]  DATE OF BIRTH:  05-25-1929  SUBJECTIVE:  patient sitting at the edge of the bed and complains of restless leg. She denies any difficulty breathing. Ambulated with PT very well. No fever. Tolerating PO diet well. No respiratory distress    VITALS:  Blood pressure (!) 130/52, pulse 81, temperature 98.2 F (36.8 C), resp. rate 16, height 5\' 4"  (1.626 m), weight 56.7 kg, SpO2 94%.  PHYSICAL EXAMINATION:   GENERAL:  89 y.o.-year-old patient with no acute distress.  LUNGS: Normal breath sounds bilaterally, no wheezing CARDIOVASCULAR: S1, S2 normal. No murmur   ABDOMEN: Soft, nontender, nondistended. Bowel sounds present.  EXTREMITIES: No  edema b/l.    NEUROLOGIC: nonfocal  patient is alert and awake SKIN: No obvious rash, lesion, or ulcer.   LABORATORY PANEL:  CBC Recent Labs  Lab 04/04/23 0316  WBC 9.0  HGB 10.3*  HCT 32.1*  PLT 160    Chemistries  Recent Labs  Lab 04/03/23 1231 04/04/23 0316  NA 137 138  K 3.7 3.9  CL 104 107  CO2 25 25  GLUCOSE 87 79  BUN 22 21  CREATININE 0.97 0.84  CALCIUM 9.0 8.1*  AST 49*  --   ALT 28  --   ALKPHOS 54  --   BILITOT 1.6*  --    Cardiac Enzymes No results for input(s): "TROPONINI" in the last 168 hours. RADIOLOGY:  DG Chest 1 View  Result Date: 04/04/2023 CLINICAL DATA:  CHF EXAM: CHEST  1 VIEW COMPARISON:  Yesterday FINDINGS: Improved interstitial coarsening. Hyperinflation with diaphragm flattening. Trace left pleural effusion. Streaky density behind the heart. No pneumothorax. Mild cardiomegaly that is stable. Upper right mediastinal widening, stable and usually ectatic vessels. IMPRESSION: 1. Improved pulmonary edema. 2. Trace left pleural effusion with retrocardiac atelectasis or infiltrate. Electronically Signed   By: Tiburcio Pea M.D.   On: 04/04/2023 07:16   CT ABDOMEN PELVIS W CONTRAST  Result Date:  04/03/2023 CLINICAL DATA:  Acute, non localized abdominal pain. EXAM: CT ABDOMEN AND PELVIS WITH CONTRAST TECHNIQUE: Multidetector CT imaging of the abdomen and pelvis was performed using the standard protocol following bolus administration of intravenous contrast. RADIATION DOSE REDUCTION: This exam was performed according to the departmental dose-optimization program which includes automated exposure control, adjustment of the mA and/or kV according to patient size and/or use of iterative reconstruction technique. CONTRAST:  75mL OMNIPAQUE IOHEXOL 300 MG/ML  SOLN COMPARISON:  01/08/2016 FINDINGS: Lower chest: Interval mild bibasilar atelectasis/scarring. Interval minimal right pleural effusion. Heart size near the upper limit of normal. Hepatobiliary: No focal liver abnormality is seen. No gallstones, gallbladder wall thickening, or biliary dilatation. Pancreas: Unremarkable. No pancreatic ductal dilatation or surrounding inflammatory changes. Spleen: Calcified granuloma.  Normal in size and shape. Adrenals/Urinary Tract: Stable small cyst in each kidney. These do not need imaging follow-up. Unremarkable adrenal glands, ureters and urinary bladder. Stomach/Bowel: Small hiatal hernia. Multiple sigmoid colon diverticula without evidence of diverticulitis. Normal-appearing appendix and small bowel. Vascular/Lymphatic: Atheromatous arterial calcifications without aneurysm. No enlarged lymph nodes. Reproductive: Left ovarian cyst measuring 1.6 cm in maximum diameter on image number 54/2, previously 0.9 cm. No visible internal nodularity. Small calcified uterine fibroid. No right adnexal mass seen. Other: No abdominal wall hernia or abnormality. No abdominopelvic ascites. Musculoskeletal: Interval 50% L4 vertebral body compression fracture with no acute fracture lines visualized. Mild-to-moderate bony retropulsion superiorly. Stable grade 1  anterolisthesis at the L4-5 level. Pedicle screw and rod fixation is again  demonstrated at the L4-5 level. Mild lumbar and lower thoracic spine degenerative changes. IMPRESSION: 1. No acute abnormality. 2. Interval 50% L4 vertebral body compression fracture with mild-to-moderate bony retropulsion superiorly with no visible acute fracture lines. 3. Interval minimal right pleural effusion. 4. Small hiatal hernia. 5. Sigmoid diverticulosis. 6. 1.6 cm left ovarian cyst, previously 0.9 cm. No follow-up imaging recommended. Note: This recommendation does not apply to premenarchal patients and to those with increased risk (genetic, family history, elevated tumor markers or other high-risk factors) of ovarian cancer. Reference: JACR 2020 Feb; 17(2):248-254 Electronically Signed   By: Beckie Salts M.D.   On: 04/03/2023 16:55   DG Chest 2 View  Result Date: 04/03/2023 CLINICAL DATA:  Cough. EXAM: CHEST - 2 VIEW COMPARISON:  03/03/2023. FINDINGS: There are increased interstitial markings throughout bilateral lungs with lower lobe predominance. No acute consolidation or major lung collapse. There are probable patchy atelectatic changes at the lung bases. Bilateral costophrenic angles are clear. Stable cardio-mediastinal silhouette. Aortic arch calcifications noted. No acute osseous abnormalities. The soft tissues are within normal limits. IMPRESSION: *Increased interstitial markings throughout bilateral lungs with lower lobe predominance. Findings may represent pulmonary edema or atypical pneumonia. Electronically Signed   By: Jules Schick M.D.   On: 04/03/2023 13:17    Assessment and Plan   Kristen Pennington is a 89 y.o. female with medical history significant of chronic pain and narcotic dependence, HTN, HLD, OSA noncompliant with CPAP, anxiety/depression, CKD stage IIIa, presented with multiple complaints including poorly localized pain, nausea and cough.   Chest x-ray appeared to have bilateral lower lobe infiltrates .  Acute hypoxic respiratory failure Secondary to bilateral  pneumonia -Continue ceftriaxone and azithromycin--change to oral abxs --sats 94% on RA -Incentive spirometry and flutter valve - cont nebs if needed   Epigastric pain with mild elevated lipase unclear etiology -- CT abdomen did not show any acute abnormality  -- patient tolerating PO diet  HTN -Restart Lasix  -Continue bisoprolol and hydrochlorothiazide   Anxiety/depression -Mentation at baseline, continue Lexapro   Deconditioning -PT evaluation-- outpatient PT  Chronic back pain and leg pain -- it seems patient is over using her tramadol -- will continue Requip    Family communication : none Consults : none CODE STATUS: DNR DVT Prophylaxis : Lovenox Level of care: Telemetry Medical Status is: Inpatient Remains inpatient appropriate because treatment for pneumonia. Remains stable will discharge tomorrow.    TOTAL TIME TAKING CARE OF THIS PATIENT: 35 minutes.  >50% time spent on counselling and coordination of care  Note: This dictation was prepared with Dragon dictation along with smaller phrase technology. Any transcriptional errors that result from this process are unintentional.  Enedina Finner M.D    [facility]   CC: Primary care physician; Sallyanne Kuster, NP

## 2023-04-04 NOTE — Plan of Care (Signed)

## 2023-04-04 NOTE — Evaluation (Signed)
Occupational Therapy Evaluation Patient Details Name: Kristen Pennington MRN: [id number] DOB: 12-Jun-1929 Today's Date: 04/04/2023   History of Present Illness Pt is a 89 y/o female admitted secondary to pain, nausea and cough. Pt found to have CAP. PMH including but not limited to chronic pain and narcotic dependence, HTN, HLD, OSA noncompliant with CPAP, anxiety/depression, CKD stage IIIa.   Clinical Impression   Patient presenting with decreased Ind in self care, balance,functional mobility/transfers, endurance, and safety awareness. Patient reports being Mod I with occasional use of SPC. Pt lives at home and endorses being Ind in ADLs and IADLs. She does not drive and neighbor assist with taking her to appointments. Patient will benefit from acute OT to increase overall independence in the areas of ADLs, functional mobility, and safety awareness  in order to safely discharge.       If plan is discharge home, recommend the following: A little help with walking and/or transfers;A little help with bathing/dressing/bathroom;Assistance with cooking/housework;Assist for transportation    Functional Status Assessment  Patient has had a recent decline in their functional status and demonstrates the ability to make significant improvements in function in a reasonable and predictable amount of time.  Equipment Recommendations  None recommended by OT       Precautions / Restrictions Precautions Precautions: Fall Restrictions Weight Bearing Restrictions: No      Mobility Bed Mobility Overal bed mobility: Modified Independent                  Transfers Overall transfer level: Needs assistance Equipment used: None Transfers: Sit to/from Stand Sit to Stand: Supervision                  Balance Overall balance assessment: Needs assistance Sitting-balance support: Feet supported Sitting balance-Leahy Scale: Good     Standing balance support: During functional activity, No  upper extremity supported Standing balance-Leahy Scale: Fair                             ADL either performed or assessed with clinical judgement   ADL Overall ADL's : Needs assistance/impaired     Grooming: Wash/dry hands;Standing;Supervision/safety                   Statistician: Supervision/safety;Regular Social worker and Hygiene: Supervision/safety;Sit to/from stand       Functional mobility during ADLs: Supervision/safety       Vision Baseline Vision/History: 2 Legally blind Patient Visual Report: No change from baseline              Pertinent Vitals/Pain Pain Assessment Pain Assessment: No/denies pain     Extremity/Trunk Assessment Upper Extremity Assessment Upper Extremity Assessment: Overall WFL for tasks assessed   Lower Extremity Assessment Lower Extremity Assessment: Overall WFL for tasks assessed       Communication Communication Communication: No apparent difficulties   Cognition Arousal: Alert Behavior During Therapy: WFL for tasks assessed/performed Overall Cognitive Status: Within Functional Limits for tasks assessed                                                  Home Living Family/patient expects to be discharged to:: Private residence Living Arrangements: Alone Available Help at Discharge: Family;Friend(s);Neighbor;Available 24 hours/day Type of Home: Apartment Home Access: Level  entry     Home Layout: One level     Bathroom Shower/Tub: Tub/shower unit         Home Equipment: Cane - single point          Prior Functioning/Environment Prior Level of Function : Independent/Modified Independent             Mobility Comments: mod I with a cane PRN ADLs Comments: independent with ADLs and IADLs with who neighbor drives her to her doctor's appointments        OT Problem List: Decreased strength;Decreased activity tolerance;Decreased safety  awareness;Impaired balance (sitting and/or standing);Decreased knowledge of use of DME or AE      OT Treatment/Interventions: Self-care/ADL training;Therapeutic exercise;Therapeutic activities;Energy conservation;DME and/or AE instruction;Patient/family education;Balance training    OT Goals(Current goals can be found in the care plan section) Acute Rehab OT Goals Patient Stated Goal: to go home OT Goal Formulation: With patient Time For Goal Achievement: 04/18/23 Potential to Achieve Goals: Fair ADL Goals Pt Will Perform Grooming: with modified independence;standing Pt Will Perform Lower Body Dressing: with modified independence;sit to/from stand Pt Will Transfer to Toilet: with modified independence;ambulating Pt Will Perform Toileting - Clothing Manipulation and hygiene: with modified independence;sit to/from stand  OT Frequency: Min 1X/week       AM-PAC OT "6 Clicks" Daily Activity     Outcome Measure Help from another person eating meals?: None Help from another person taking care of personal grooming?: None Help from another person toileting, which includes using toliet, bedpan, or urinal?: None Help from another person bathing (including washing, rinsing, drying)?: A Little Help from another person to put on and taking off regular upper body clothing?: None Help from another person to put on and taking off regular lower body clothing?: None 6 Click Score: 23   End of Session Nurse Communication: Mobility status  Activity Tolerance: Patient tolerated treatment well Patient left: in bed  OT Visit Diagnosis: Unsteadiness on feet (R26.81)                Time: 8756-4332 OT Time Calculation (min): 19 min Charges:  OT General Charges $OT Visit: 1 Visit OT Evaluation $OT Eval Low Complexity: 1 Low OT Treatments $Self Care/Home Management : 8-22 mins  Jackquline Denmark, MS, OTR/L , CBIS ascom [phone]  04/04/23, 12:35 PM

## 2023-04-05 ENCOUNTER — Inpatient Hospital Stay (HOSPITAL_COMMUNITY)
Admit: 2023-04-05 | Discharge: 2023-04-05 | Disposition: A | Discharge status: Home / Self Care | Payer: Medicare HMO | Attending: Internal Medicine

## 2023-04-05 DIAGNOSIS — I509 Heart failure, unspecified: Secondary | ICD-10-CM | POA: Diagnosis not present

## 2023-04-05 DIAGNOSIS — M545 Low back pain, unspecified: Secondary | ICD-10-CM | POA: Diagnosis not present

## 2023-04-05 DIAGNOSIS — J189 Pneumonia, unspecified organism: Secondary | ICD-10-CM | POA: Diagnosis not present

## 2023-04-05 DIAGNOSIS — G8929 Other chronic pain: Secondary | ICD-10-CM | POA: Diagnosis not present

## 2023-04-05 DIAGNOSIS — G2581 Restless legs syndrome: Secondary | ICD-10-CM | POA: Diagnosis not present

## 2023-04-05 LAB — ECHOCARDIOGRAM COMPLETE
AR max vel: 2.08 cm2
AV Area VTI: 1.99 cm2
AV Area mean vel: 1.88 cm2
AV Mean grad: 6 mmHg
AV Peak grad: 11.8 mmHg
Ao pk vel: 1.72 m/s
Area-P 1/2: 4.21 cm2
Height: 64 in
MV VTI: 2.03 cm2
S' Lateral: 2.1 cm
Weight: 2000 oz

## 2023-04-05 MED ORDER — AMOXICILLIN-POT CLAVULANATE 875-125 MG PO TABS: 1.0000 | ORAL_TABLET | Freq: Two times a day (BID) | ORAL | 0 refills | Status: AC

## 2023-04-05 MED ORDER — AZITHROMYCIN 500 MG PO TABS: 500.0000 mg | ORAL_TABLET | Freq: Every day | ORAL | 0 refills | Status: AC

## 2023-04-05 NOTE — Discharge Summary (Signed)
Physician Discharge Summary   Patient: Kristen Pennington MRN: [id number] DOB: [birth year]  Admit date:     04/03/2023  Discharge date: 04/05/23  Discharge Physician: Enedina Finner   PCP: Sallyanne Kuster, NP   Recommendations at discharge:     F/u PCP in 1-2 weeks  Discharge Diagnoses: Principal Problem:   Pneumonia Active Problems:   CAP (community acquired pneumonia)   Chronic low back pain   Acute respiratory failure with hypoxia (HCC)   Restless leg syndrome  Kristen Pennington is a 89 y.o. female with medical history significant of chronic pain and narcotic dependence, HTN, HLD, OSA noncompliant with CPAP, anxiety/depression, CKD stage IIIa, presented with multiple complaints including poorly localized pain, nausea and cough.   Chest x-ray appeared to have bilateral lower lobe infiltrates .   Acute hypoxic respiratory failure Secondary to bilateral pneumonia -Continue ceftriaxone and azithromycin--change to oral abxs --sats 94% on RA -Incentive spirometry and flutter valve - cont nebs if needed   Epigastric pain with mild elevated lipase unclear etiology -- CT abdomen did not show any acute abnormality  -- patient tolerating PO diet   HTN -Restart Lasix  -Continue bisoprolol and hydrochlorothiazide   Anxiety/depression -Mentation at baseline, continue Lexapro   Deconditioning -PT evaluation-- outpatient PT   Chronic back pain and leg pain --pain meds per PCP--d/w pt's dter Arline Asp and will cont present meds. I will not be giving any rx's for pain meds -- will continue Requip       Family communication : none Consults : none CODE STATUS: DNR DVT Prophylaxis : Lovenox      Pain control - [place] Controlled Substance Reporting System database was reviewed. and patient was instructed, not to drive, operate heavy machinery, perform activities at heights, swimming or participation in water activities or provide baby-sitting services while on Pain, Sleep and  Anxiety Medications; until their outpatient Physician has advised to do so again. Also recommended to not to take more than prescribed Pain, Sleep and Anxiety Medications.  Disposition: Home Diet recommendation:  Discharge Diet Orders (From admission, onward)     Start     Ordered   04/05/23 0000  Diet - low sodium heart healthy        04/05/23 0834           Cardiac diet DISCHARGE MEDICATION: Allergies as of 04/05/2023       Reactions   Celebrex [celecoxib] Rash        Medication List     STOP taking these medications    cyclobenzaprine 10 MG tablet Commonly known as: FLEXERIL   estradiol 0.1 MG/GM vaginal cream Commonly known as: ESTRACE   hydrALAZINE 10 MG tablet Commonly known as: APRESOLINE   promethazine 12.5 MG tablet Commonly known as: PHENERGAN       TAKE these medications    acetaminophen 500 MG tablet Commonly known as: TYLENOL Take 500 mg by mouth every 6 (six) hours as needed.   amoxicillin-clavulanate 875-125 MG tablet Commonly known as: AUGMENTIN Take 1 tablet by mouth every 12 (twelve) hours for 6 days.   azithromycin 500 MG tablet Commonly known as: ZITHROMAX Take 1 tablet (500 mg total) by mouth daily for 3 days.   bisoprolol-hydrochlorothiazide 5-6.25 MG tablet Commonly known as: ZIAC Take 1 tablet by mouth daily.   escitalopram 10 MG tablet Commonly known as: LEXAPRO TAKE 1 TABLET BY MOUTH DAILY   fluticasone 50 MCG/ACT nasal spray Commonly known as: FLONASE 1 SPRAY IN EACH NOSTRIL ONCE  DAILY AS NEEDED What changed:  how much to take how to take this when to take this   furosemide 40 MG tablet Commonly known as: LASIX Take 1 tablet (40 mg total) by mouth daily.   magnesium oxide 400 MG tablet Commonly known as: MAG-OX Take 1 tablet (400 mg total) by mouth at bedtime. At bedtime   polyethylene glycol powder 17 GM/SCOOP powder Commonly known as: GLYCOLAX/MIRALAX Take 17 g by mouth daily as needed for mild  constipation or moderate constipation.   rOPINIRole 1 MG tablet Commonly known as: REQUIP Take 1 tablet by mouth in am and 2 tablets by mouth at bedtime   Salonpas Pain Relief Patch Ptch Apply 1 patch topically daily as needed (Pain).   traMADol 50 MG tablet Commonly known as: ULTRAM Take 1 tablet (50 mg total) by mouth in the morning, at noon, and at bedtime.   traZODone 50 MG tablet Commonly known as: DESYREL Take 1 tablet (50 mg total) by mouth at bedtime.   triamcinolone cream 0.1 % Commonly known as: KENALOG Apply 1 Application topically 2 (two) times daily. To rash area until resolved.   Vitamin D3 125 MCG (5000 UT) Tabs Take 5,000 Units by mouth daily.        Follow-up Information     Sallyanne Kuster, NP. Schedule an appointment as soon as possible for a visit in 1 week(s).   Specialty: Nurse Practitioner Why: hospital f/u Contact information: [address] [place] [address] [phone]                Discharge Exam: Ceasar Mons Weights   04/03/23 1230  Weight: 56.7 kg   GENERAL:  89 y.o.-year-old patient with no acute distress.  LUNGS: Normal breath sounds bilaterally, no wheezing CARDIOVASCULAR: S1, S2 normal. No murmur   ABDOMEN: Soft, nontender, nondistended. Bowel sounds present.  EXTREMITIES: No  edema b/l.    NEUROLOGIC: nonfocal  patient is alert and awake  Condition at discharge: fair  The results of significant diagnostics from this hospitalization (including imaging, microbiology, ancillary and laboratory) are listed below for reference.   Imaging Studies: DG Chest 1 View  Result Date: 04/04/2023 CLINICAL DATA:  CHF EXAM: CHEST  1 VIEW COMPARISON:  Yesterday FINDINGS: Improved interstitial coarsening. Hyperinflation with diaphragm flattening. Trace left pleural effusion. Streaky density behind the heart. No pneumothorax. Mild cardiomegaly that is stable. Upper right mediastinal widening, stable and usually ectatic vessels.  IMPRESSION: 1. Improved pulmonary edema. 2. Trace left pleural effusion with retrocardiac atelectasis or infiltrate. Electronically Signed   By: Tiburcio Pea M.D.   On: 04/04/2023 07:16   CT ABDOMEN PELVIS W CONTRAST  Result Date: 04/03/2023 CLINICAL DATA:  Acute, non localized abdominal pain. EXAM: CT ABDOMEN AND PELVIS WITH CONTRAST TECHNIQUE: Multidetector CT imaging of the abdomen and pelvis was performed using the standard protocol following bolus administration of intravenous contrast. RADIATION DOSE REDUCTION: This exam was performed according to the departmental dose-optimization program which includes automated exposure control, adjustment of the mA and/or kV according to patient size and/or use of iterative reconstruction technique. CONTRAST:  75mL OMNIPAQUE IOHEXOL 300 MG/ML  SOLN COMPARISON:  01/08/2016 FINDINGS: Lower chest: Interval mild bibasilar atelectasis/scarring. Interval minimal right pleural effusion. Heart size near the upper limit of normal. Hepatobiliary: No focal liver abnormality is seen. No gallstones, gallbladder wall thickening, or biliary dilatation. Pancreas: Unremarkable. No pancreatic ductal dilatation or surrounding inflammatory changes. Spleen: Calcified granuloma.  Normal in size and shape. Adrenals/Urinary Tract: Stable small cyst in each  kidney. These do not need imaging follow-up. Unremarkable adrenal glands, ureters and urinary bladder. Stomach/Bowel: Small hiatal hernia. Multiple sigmoid colon diverticula without evidence of diverticulitis. Normal-appearing appendix and small bowel. Vascular/Lymphatic: Atheromatous arterial calcifications without aneurysm. No enlarged lymph nodes. Reproductive: Left ovarian cyst measuring 1.6 cm in maximum diameter on image number 54/2, previously 0.9 cm. No visible internal nodularity. Small calcified uterine fibroid. No right adnexal mass seen. Other: No abdominal wall hernia or abnormality. No abdominopelvic ascites.  Musculoskeletal: Interval 50% L4 vertebral body compression fracture with no acute fracture lines visualized. Mild-to-moderate bony retropulsion superiorly. Stable grade 1 anterolisthesis at the L4-5 level. Pedicle screw and rod fixation is again demonstrated at the L4-5 level. Mild lumbar and lower thoracic spine degenerative changes. IMPRESSION: 1. No acute abnormality. 2. Interval 50% L4 vertebral body compression fracture with mild-to-moderate bony retropulsion superiorly with no visible acute fracture lines. 3. Interval minimal right pleural effusion. 4. Small hiatal hernia. 5. Sigmoid diverticulosis. 6. 1.6 cm left ovarian cyst, previously 0.9 cm. No follow-up imaging recommended. Note: This recommendation does not apply to premenarchal patients and to those with increased risk (genetic, family history, elevated tumor markers or other high-risk factors) of ovarian cancer. Reference: JACR 2020 Feb; 17(2):248-254 Electronically Signed   By: Beckie Salts M.D.   On: 04/03/2023 16:55   DG Chest 2 View  Result Date: 04/03/2023 CLINICAL DATA:  Cough. EXAM: CHEST - 2 VIEW COMPARISON:  03/03/2023. FINDINGS: There are increased interstitial markings throughout bilateral lungs with lower lobe predominance. No acute consolidation or major lung collapse. There are probable patchy atelectatic changes at the lung bases. Bilateral costophrenic angles are clear. Stable cardio-mediastinal silhouette. Aortic arch calcifications noted. No acute osseous abnormalities. The soft tissues are within normal limits. IMPRESSION: *Increased interstitial markings throughout bilateral lungs with lower lobe predominance. Findings may represent pulmonary edema or atypical pneumonia. Electronically Signed   By: Jules Schick M.D.   On: 04/03/2023 13:17    Microbiology: Results for orders placed or performed during the hospital encounter of 04/03/23  Resp panel by RT-PCR (RSV, Flu A&B, Covid) Anterior Nasal Swab     Status: None    Collection Time: 04/03/23 12:44 PM   Specimen: Anterior Nasal Swab  Result Value Ref Range Status   SARS Coronavirus 2 by RT PCR NEGATIVE NEGATIVE Final    Comment: (NOTE) SARS-CoV-2 target nucleic acids are NOT DETECTED.  The SARS-CoV-2 RNA is generally detectable in upper respiratory specimens during the acute phase of infection. The lowest concentration of SARS-CoV-2 viral copies this assay can detect is 138 copies/mL. A negative result does not preclude SARS-Cov-2 infection and should not be used as the sole basis for treatment or other patient management decisions. A negative result may occur with  improper specimen collection/handling, submission of specimen other than nasopharyngeal swab, presence of viral mutation(s) within the areas targeted by this assay, and inadequate number of viral copies(<138 copies/mL). A negative result must be combined with clinical observations, patient history, and epidemiological information. The expected result is Negative.  Fact Sheet for Patients:  BloggerCourse.com  Fact Sheet for Healthcare Providers:  SeriousBroker.it  This test is no t yet approved or cleared by the [place] FDA and  has been authorized for detection and/or diagnosis of SARS-CoV-2 by FDA under an Emergency Use Authorization (EUA). This EUA will remain  in effect (meaning this test can be used) for the duration of the COVID-19 declaration under Section 564(b)(1) of the Act, 21 U.S.C.section 360bbb-3(b)(1), unless the authorization  is terminated  or revoked sooner.       Influenza A by PCR NEGATIVE NEGATIVE Final   Influenza B by PCR NEGATIVE NEGATIVE Final    Comment: (NOTE) The Xpert Xpress SARS-CoV-2/FLU/RSV plus assay is intended as an aid in the diagnosis of influenza from Nasopharyngeal swab specimens and should not be used as a sole basis for treatment. Nasal washings and aspirates are unacceptable for  Xpert Xpress SARS-CoV-2/FLU/RSV testing.  Fact Sheet for Patients: BloggerCourse.com  Fact Sheet for Healthcare Providers: SeriousBroker.it  This test is not yet approved or cleared by the [place] FDA and has been authorized for detection and/or diagnosis of SARS-CoV-2 by FDA under an Emergency Use Authorization (EUA). This EUA will remain in effect (meaning this test can be used) for the duration of the COVID-19 declaration under Section 564(b)(1) of the Act, 21 U.S.C. section 360bbb-3(b)(1), unless the authorization is terminated or revoked.     Resp Syncytial Virus by PCR NEGATIVE NEGATIVE Final    Comment: (NOTE) Fact Sheet for Patients: BloggerCourse.com  Fact Sheet for Healthcare Providers: SeriousBroker.it  This test is not yet approved or cleared by the [place] FDA and has been authorized for detection and/or diagnosis of SARS-CoV-2 by FDA under an Emergency Use Authorization (EUA). This EUA will remain in effect (meaning this test can be used) for the duration of the COVID-19 declaration under Section 564(b)(1) of the Act, 21 U.S.C. section 360bbb-3(b)(1), unless the authorization is terminated or revoked.  Performed at [facility], [address] [place], [place] [address]     Labs: CBC: Recent Labs  Lab 04/03/23 1231 04/04/23 0316  WBC 14.9* 9.0  NEUTROABS 14.1*  --   HGB 12.8 10.3*  HCT 39.9 32.1*  MCV 96.1 95.5  PLT 197 160   Basic Metabolic Panel: Recent Labs  Lab 04/03/23 1231 04/04/23 0316  NA 137 138  K 3.7 3.9  CL 104 107  CO2 25 25  GLUCOSE 87 79  BUN 22 21  CREATININE 0.97 0.84  CALCIUM 9.0 8.1*   Liver Function Tests: Recent Labs  Lab 04/03/23 1231  AST 49*  ALT 28  ALKPHOS 54  BILITOT 1.6*  PROT 7.0  ALBUMIN 3.6    Discharge time spent: greater than 30 minutes.  Signed: Enedina Finner, MD [facility] 04/05/2023

## 2023-04-05 NOTE — Progress Notes (Signed)
*  PRELIMINARY RESULTS* Echocardiogram 2D Echocardiogram has been performed.  Kristen Pennington 04/05/2023, 10:52 AM

## 2023-04-05 NOTE — Progress Notes (Signed)
DISCHARGE NOTE:    Pt discharged with IV removed and packet in hand. Pt's hearing aids also with her. Pt education completed and no further education or concerns at this time.Pt's neighbor provided transportation.

## 2023-04-05 NOTE — Plan of Care (Signed)
Patient has been free of injury or falls. VSS. Patient reports pain in her lower legs relieved by tylenol. Patient's oxygen saturations are WNL. Patient was anxious at the beginning of the shift and upset that she was moving rooms. Patient called 911 from her phone to report what happened to the 911 operator. Charge nurse Elnita Maxwell spoke to office and patients daughter. Daughter reports that "the patient acts crazy" from tramadol. Tramadol was not given on night shift but she did have it on day shift.  Patient pulled out her IV and said she did not want to wear it. Patient to be discharged today. Nursing will continue to monitor progress towards goals.

## 2023-04-09 ENCOUNTER — Telehealth (INDEPENDENT_AMBULATORY_CARE_PROVIDER_SITE_OTHER): Payer: Medicare HMO | Admitting: Nurse Practitioner

## 2023-04-09 ENCOUNTER — Encounter: Payer: Self-pay | Admitting: Nurse Practitioner

## 2023-04-09 VITALS — Ht 64.0 in | Wt 130.0 lb

## 2023-04-09 DIAGNOSIS — R110 Nausea: Secondary | ICD-10-CM

## 2023-04-09 DIAGNOSIS — R0982 Postnasal drip: Secondary | ICD-10-CM

## 2023-04-09 DIAGNOSIS — J189 Pneumonia, unspecified organism: Secondary | ICD-10-CM | POA: Diagnosis not present

## 2023-04-09 DIAGNOSIS — Z09 Encounter for follow-up examination after completed treatment for conditions other than malignant neoplasm: Secondary | ICD-10-CM

## 2023-04-09 MED ORDER — ALBUTEROL SULFATE HFA 108 (90 BASE) MCG/ACT IN AERS: 2.0000 | INHALATION_SPRAY | Freq: Four times a day (QID) | RESPIRATORY_TRACT | 2 refills | Status: DC | PRN

## 2023-04-09 MED ORDER — PROMETHAZINE HCL 12.5 MG PO TABS
12.5000 mg | ORAL_TABLET | Freq: Four times a day (QID) | ORAL | 3 refills | Status: DC | PRN
Start: 2023-04-09 — End: 2024-04-24

## 2023-04-09 MED ORDER — FLUTICASONE PROPIONATE 50 MCG/ACT NA SUSP: 1.0000 | Freq: Every day | NASAL | 3 refills | Status: DC

## 2023-04-09 NOTE — Progress Notes (Cosign Needed Addendum)
[facility] [place], [place] [address] [place] [address] [phone]                                   Transitional Care Clinic   [facility] Discharge Acute Issues Care Follow Up                                                                        Patient Demographics  Kristen Pennington, is a 89 y.o. female  DOB [birth year]  MRN [id number].  Primary MD  Sallyanne Kuster, NP  I connected with the patient at 1200 by telephone and verified the patients identity using two identifiers.   I discussed the limitations, risks, security and privacy concerns of performing an evaluation and management service by telephone and the availability of in person appointments. I also discussed with the patient that there may be a patient responsible charge related to the service.  The patient expressed understanding and agrees to proceed.   Admit date:     04/03/2023  Discharge date: 04/05/23    Reason for TCC follow Up - pneumonia   Past Medical History:  Diagnosis Date   Arthritis    Atrophic vaginitis 03/06/2013   Last Assessment & Plan:  She will plan to continue estradiol vaginal cream.  Prescription was rewritten stipulating use of the generic product.    Atypical migraine 03/06/2013   Last Assessment & Plan:  Since she rarely takes sumatriptan, we will discontinue it. Continue sparing use of OTC migraine products.    Avitaminosis D 03/06/2013   Last Assessment & Plan:  Recheck vitamin D level. Plan to continue vitamin D supplementation.    Ceratitis 08/01/2013   Last Assessment & Plan:  Because of the expense, she will discontinue Restasis. She will use over-the-counter moisturizing eyedrops and liquid gel.    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    Cystocele, midline 03/06/2013   Demoralization and apathy 03/06/2013   Last Assessment & Plan:  Since she has been taking bupropion only once a day, I will write the prescription with the correct instructions and correct quantity. She has done well on this for years and she is encouraged to take it regularly    Depression    Environmental allergies    Epistaxis    Essential (primary) hypertension 05/17/2013   Last Assessment & Plan:  Her blood pressure is well-controlled. We will plan to continue hydrochlorothiazide and benazepril.  Both are generic and should be affordable on her health plan.    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 We will continue that regimen.    Sinusitis    Sleep apnea    CPAP  Past Surgical History:  Procedure Laterality Date   COLONOSCOPY  05-03-15   COLONOSCOPY WITH PROPOFOL N/A 05/03/2015   Procedure: COLONOSCOPY WITH PROPOFOL;  Surgeon: Midge Minium, MD;  Location: [facility];  Service: Endoscopy;  Laterality: N/A;  CPAP   DILATION AND CURETTAGE OF UTERUS  1970   ENDOMETRIAL BIOPSY     EYE SURGERY Right 2017   POLYPECTOMY  05/03/2015   Procedure: POLYPECTOMY;  Surgeon: Midge Minium, MD;  Location: [facility];  Service: Endoscopy;;   ROTATOR CUFF REPAIR  2007   SPINE SURGERY         Recent HPI and Hospital Course  Kristen Pennington is a 89 y.o. female with medical history significant of chronic pain and narcotic dependence, HTN, HLD, OSA noncompliant with CPAP, anxiety/depression, CKD stage IIIa, presented with multiple complaints including poorly localized pain, nausea and cough.   Chest x-ray appeared to have bilateral lower lobe infiltrates .   Acute hypoxic respiratory failure Secondary to  bilateral pneumonia -Continue ceftriaxone and azithromycin--change to oral abxs --sats 94% on RA -Incentive spirometry and flutter valve - cont nebs if needed   Epigastric pain with mild elevated lipase unclear etiology -- CT abdomen did not show any acute abnormality  -- patient tolerating PO diet   HTN -Restart Lasix  -Continue bisoprolol and hydrochlorothiazide   Anxiety/depression -Mentation at baseline, continue Lexapro   Deconditioning -PT evaluation-- outpatient PT   Chronic back pain and leg pain --pain meds per PCP--d/w pt's dter Arline Asp and will cont present meds. I will not be giving any rx's for pain meds -- will continue Requip      Post Hospital Acute Care Issue to be followed in the Clinic    Pneumonia Active Problems:   CAP (community acquired pneumonia)   Chronic low back pain   Acute respiratory failure with hypoxia (HCC)   Restless leg syndrome   Subjective:   Kristen Pennington today has, No headache, No chest pain, No abdominal pain - No Nausea, No new weakness tingling or numbness, No Cough - SOB.   Assessment & Plan   1. Hospital discharge follow-up Treated in hospital for community acquire pneumonia  2. Community acquired pneumonia, unspecified laterality Treated with intravenous antibiotics, oxygen and breathing treatments.   3. Nausea Still having nausea, zofran is not helping, promethazine prescribed.  - promethazine (PHENERGAN) 12.5 MG tablet; Take 1 tablet (12.5 mg total) by mouth every 6 (six) hours as needed for nausea or vomiting.  Dispense: 30 tablet; Refill: 3  4. Post-nasal drip Continue flonase as prescribed.  - fluticasone (FLONASE) 50 MCG/ACT nasal spray; Place 1 spray into both nostrils daily. 1 SPRAY IN EACH NOSTRIL ONCE DAILY AS NEEDED  Dispense: 16 g; Refill: 3    Reason for frequent admissions/ER visits     Pneumonia Prediabetes  Heart failure   Objective:   Vitals:   04/09/23 1033  Weight: 130 lb (59 kg)   Height: 5\' 4"  (1.626 m)    Wt Readings from Last 3 Encounters:  04/09/23 130 lb (59 kg)  04/03/23 125 lb (56.7 kg)  03/18/23 130 lb 9.6 oz (59.2 kg)    Allergies as of 04/09/2023       Reactions   Celebrex [celecoxib] Rash        Medication List        Accurate as of April 09, 2023  3:47 PM. If you have any questions, ask your nurse or doctor.  acetaminophen 500 MG tablet Commonly known as: TYLENOL Take 500 mg by mouth every 6 (six) hours as needed.   albuterol 108 (90 Base) MCG/ACT inhaler Commonly known as: VENTOLIN HFA Inhale 2 puffs into the lungs every 6 (six) hours as needed for wheezing or shortness of breath. Started by: Sallyanne Kuster   amoxicillin-clavulanate 875-125 MG tablet Commonly known as: AUGMENTIN Take 1 tablet by mouth every 12 (twelve) hours for 6 days.   bisoprolol-hydrochlorothiazide 5-6.25 MG tablet Commonly known as: ZIAC Take 1 tablet by mouth daily.   escitalopram 10 MG tablet Commonly known as: LEXAPRO TAKE 1 TABLET BY MOUTH DAILY   fluticasone 50 MCG/ACT nasal spray Commonly known as: FLONASE Place 1 spray into both nostrils daily. 1 SPRAY IN EACH NOSTRIL ONCE DAILY AS NEEDED   furosemide 40 MG tablet Commonly known as: LASIX Take 1 tablet (40 mg total) by mouth daily.   magnesium oxide 400 MG tablet Commonly known as: MAG-OX Take 1 tablet (400 mg total) by mouth at bedtime. At bedtime   polyethylene glycol powder 17 GM/SCOOP powder Commonly known as: GLYCOLAX/MIRALAX Take 17 g by mouth daily as needed for mild constipation or moderate constipation.   promethazine 12.5 MG tablet Commonly known as: PHENERGAN Take 1 tablet (12.5 mg total) by mouth every 6 (six) hours as needed for nausea or vomiting. Started by: Sallyanne Kuster   rOPINIRole 1 MG tablet Commonly known as: REQUIP Take 1 tablet by mouth in am and 2 tablets by mouth at bedtime   Salonpas Pain Relief Patch Ptch Apply 1 patch topically daily  as needed (Pain).   traMADol 50 MG tablet Commonly known as: ULTRAM Take 1 tablet (50 mg total) by mouth in the morning, at noon, and at bedtime.   traZODone 50 MG tablet Commonly known as: DESYREL Take 1 tablet (50 mg total) by mouth at bedtime.   triamcinolone cream 0.1 % Commonly known as: KENALOG Apply 1 Application topically 2 (two) times daily. To rash area until resolved.   Vitamin D3 125 MCG (5000 UT) Tabs Take 5,000 Units by mouth daily.         Objective data: Patient is alert and oriented. She is tearful and has depressed mood.  No acute distress noted.   Data Review   Micro Results Recent Results (from the past 240 hour(s))  Resp panel by RT-PCR (RSV, Flu A&B, Covid) Anterior Nasal Swab     Status: None   Collection Time: 04/03/23 12:44 PM   Specimen: Anterior Nasal Swab  Result Value Ref Range Status   SARS Coronavirus 2 by RT PCR NEGATIVE NEGATIVE Final    Comment: (NOTE) SARS-CoV-2 target nucleic acids are NOT DETECTED.  The SARS-CoV-2 RNA is generally detectable in upper respiratory specimens during the acute phase of infection. The lowest concentration of SARS-CoV-2 viral copies this assay can detect is 138 copies/mL. A negative result does not preclude SARS-Cov-2 infection and should not be used as the sole basis for treatment or other patient management decisions. A negative result may occur with  improper specimen collection/handling, submission of specimen other than nasopharyngeal swab, presence of viral mutation(s) within the areas targeted by this assay, and inadequate number of viral copies(<138 copies/mL). A negative result must be combined with clinical observations, patient history, and epidemiological information. The expected result is Negative.  Fact Sheet for Patients:  BloggerCourse.com  Fact Sheet for Healthcare Providers:  SeriousBroker.it  This test is no t yet approved or  cleared by the [place]  FDA and  has been authorized for detection and/or diagnosis of SARS-CoV-2 by FDA under an Emergency Use Authorization (EUA). This EUA will remain  in effect (meaning this test can be used) for the duration of the COVID-19 declaration under Section 564(b)(1) of the Act, 21 U.S.C.section 360bbb-3(b)(1), unless the authorization is terminated  or revoked sooner.       Influenza A by PCR NEGATIVE NEGATIVE Final   Influenza B by PCR NEGATIVE NEGATIVE Final    Comment: (NOTE) The Xpert Xpress SARS-CoV-2/FLU/RSV plus assay is intended as an aid in the diagnosis of influenza from Nasopharyngeal swab specimens and should not be used as a sole basis for treatment. Nasal washings and aspirates are unacceptable for Xpert Xpress SARS-CoV-2/FLU/RSV testing.  Fact Sheet for Patients: BloggerCourse.com  Fact Sheet for Healthcare Providers: SeriousBroker.it  This test is not yet approved or cleared by the [place] FDA and has been authorized for detection and/or diagnosis of SARS-CoV-2 by FDA under an Emergency Use Authorization (EUA). This EUA will remain in effect (meaning this test can be used) for the duration of the COVID-19 declaration under Section 564(b)(1) of the Act, 21 U.S.C. section 360bbb-3(b)(1), unless the authorization is terminated or revoked.     Resp Syncytial Virus by PCR NEGATIVE NEGATIVE Final    Comment: (NOTE) Fact Sheet for Patients: BloggerCourse.com  Fact Sheet for Healthcare Providers: SeriousBroker.it  This test is not yet approved or cleared by the [place] FDA and has been authorized for detection and/or diagnosis of SARS-CoV-2 by FDA under an Emergency Use Authorization (EUA). This EUA will remain in effect (meaning this test can be used) for the duration of the COVID-19 declaration under Section 564(b)(1) of the Act, 21  U.S.C. section 360bbb-3(b)(1), unless the authorization is terminated or revoked.  Performed at [facility], [address] [place], [place] [address]      CBC Recent Labs  Lab 04/03/23 1231 04/04/23 0316  WBC 14.9* 9.0  HGB 12.8 10.3*  HCT 39.9 32.1*  PLT 197 160  MCV 96.1 95.5  MCH 30.8 30.7  MCHC 32.1 32.1  RDW 13.0 13.2  LYMPHSABS 0.2*  --   MONOABS 0.4  --   EOSABS 0.1  --   BASOSABS 0.0  --     Chemistries  Recent Labs  Lab 04/03/23 1231 04/04/23 0316  NA 137 138  K 3.7 3.9  CL 104 107  CO2 25 25  GLUCOSE 87 79  BUN 22 21  CREATININE 0.97 0.84  CALCIUM 9.0 8.1*  AST 49*  --   ALT 28  --   ALKPHOS 54  --   BILITOT 1.6*  --    ------------------------------------------------------------------------------------------------------------------ estimated creatinine clearance is 36.9 mL/min (by C-G formula based on SCr of 0.84 mg/dL). ------------------------------------------------------------------------------------------------------------------ No results for input(s): "HGBA1C" in the last 72 hours. ------------------------------------------------------------------------------------------------------------------ No results for input(s): "CHOL", "HDL", "LDLCALC", "TRIG", "CHOLHDL", "LDLDIRECT" in the last 72 hours. ------------------------------------------------------------------------------------------------------------------ No results for input(s): "TSH", "T4TOTAL", "T3FREE", "THYROIDAB" in the last 72 hours.  Invalid input(s): "FREET3" ------------------------------------------------------------------------------------------------------------------ No results for input(s): "VITAMINB12", "FOLATE", "FERRITIN", "TIBC", "IRON", "RETICCTPCT" in the last 72 hours.  Coagulation profile No results for input(s): "INR", "PROTIME" in the last 168 hours.  No results for input(s): "DDIMER" in the last 72 hours.  Cardiac Enzymes No results for  input(s): "CKMB", "TROPONINI", "MYOGLOBIN" in the last 168 hours.  Invalid input(s): "CK" ------------------------------------------------------------------------------------------------------------------ Invalid input(s): "POCBNP"  Return if symptoms worsen or fail to improve, for keep previously scheduled follow  up.   Time Spent in minutes  45 Time spent with patient included reviewing progress notes, labs, imaging studies, and discussing plan for follow up.   This patient was seen by Sallyanne Kuster, FNP-C in collaboration with Dr. Beverely Risen as a part of collaborative care agreement.    Sallyanne Kuster MSN, FNP-C on 04/09/2023 at 3:47 PM   **Disclaimer: This note may have been dictated with voice recognition software. Similar sounding words can inadvertently be transcribed and this note may contain transcription errors which may not have been corrected upon publication of note.**

## 2023-04-19 ENCOUNTER — Encounter: Payer: Self-pay | Admitting: Nurse Practitioner

## 2023-04-19 ENCOUNTER — Ambulatory Visit (INDEPENDENT_AMBULATORY_CARE_PROVIDER_SITE_OTHER): Payer: Medicare HMO | Admitting: Nurse Practitioner

## 2023-04-19 VITALS — BP 136/70 | HR 65 | Temp 98.6°F | Resp 16 | Ht 64.0 in | Wt 129.0 lb

## 2023-04-19 DIAGNOSIS — Z0000 Encounter for general adult medical examination without abnormal findings: Secondary | ICD-10-CM

## 2023-04-19 DIAGNOSIS — L089 Local infection of the skin and subcutaneous tissue, unspecified: Secondary | ICD-10-CM

## 2023-04-19 DIAGNOSIS — E538 Deficiency of other specified B group vitamins: Secondary | ICD-10-CM | POA: Diagnosis not present

## 2023-04-19 DIAGNOSIS — I10 Essential (primary) hypertension: Secondary | ICD-10-CM | POA: Diagnosis not present

## 2023-04-19 DIAGNOSIS — Z23 Encounter for immunization: Secondary | ICD-10-CM

## 2023-04-19 DIAGNOSIS — I5023 Acute on chronic systolic (congestive) heart failure: Secondary | ICD-10-CM

## 2023-04-19 MED ORDER — BISOPROLOL-HYDROCHLOROTHIAZIDE 5-6.25 MG PO TABS
1.0000 | ORAL_TABLET | Freq: Every day | ORAL | 6 refills | Status: DC
Start: 2023-04-19 — End: 2023-05-11

## 2023-04-19 MED ORDER — MUPIROCIN 2 % EX OINT
1.0000 | TOPICAL_OINTMENT | Freq: Every day | CUTANEOUS | 0 refills | Status: DC
Start: 2023-04-19 — End: 2023-06-29

## 2023-04-19 MED ORDER — FUROSEMIDE 40 MG PO TABS
40.0000 mg | ORAL_TABLET | Freq: Every day | ORAL | 3 refills | Status: DC
Start: 2023-04-19 — End: 2023-05-11

## 2023-04-19 MED ORDER — CYANOCOBALAMIN 1000 MCG/ML IJ SOLN
1000.0000 ug | Freq: Once | INTRAMUSCULAR | Status: AC
Start: 2023-04-19 — End: 2023-04-19
  Administered 2023-04-19: 1000 ug via INTRAMUSCULAR

## 2023-04-19 NOTE — Progress Notes (Signed)
[facility] [address] [place], [place] [address]  Internal MEDICINE  Office Visit Note  Patient Name: Kristen Pennington  [id number]  [id number]  Date of Service: 04/19/2023  Chief Complaint  Patient presents with   Depression   Gastroesophageal Reflux   Hypertension   Hyperlipidemia   Medicare Wellness    Depression        Associated symptoms include no fatigue, no headaches and no suicidal ideas. Gastroesophageal Reflux She reports no abdominal pain, no chest pain, no coughing, no nausea or no wheezing. Pertinent negatives include no fatigue.  Hypertension Pertinent negatives include no chest pain, headaches, neck pain, palpitations or shortness of breath.  Hyperlipidemia Pertinent negatives include no chest pain or shortness of breath.   Kristen Pennington presents for a medicare annual well visit. Well-appearing 89 y.o. female with hypertension, heart failure, osteoarthritis, GERD, macular degeneration, memory loss, IBS-C, and allergic rhinitis.  No routine screenings due based on age. Labs: labs done recently and results discussed previously.  New or worsening pain: chronic pain, takes tramadol. No significant change.  Other concerns: requesting flu vaccine and a B12 injection.       04/19/2023   10:05 AM 04/15/2022    3:00 PM 03/10/2021   11:00 AM  MMSE - Mini Mental State Exam  Orientation to time 5 5 5   Orientation to Place 5 5 5   Registration 3 3 3   Attention/ Calculation 5 5 5   Recall 3 3 3   Language- name 2 objects 2 2 2   Language- repeat 1 1 1   Language- follow 3 step command 3 3 3   Language- read & follow direction 1 1 1   Write a sentence 0 0 0  Copy design 1 1 1   Total score 29 29 29     Functional Status Survey: Is the patient deaf or have difficulty hearing?: Yes Does the patient have difficulty seeing, even when wearing glasses/contacts?: Yes Does the patient have difficulty concentrating, remembering, or making decisions?: Yes Does the patient  have difficulty walking or climbing stairs?: Yes Does the patient have difficulty dressing or bathing?: No Does the patient have difficulty doing errands alone such as visiting a doctor's office or shopping?: Yes     12/31/2021    9:21 PM 02/04/2022    9:26 AM 04/15/2022    2:58 PM 11/30/2022   10:48 AM 04/19/2023   10:02 AM  Fall Risk  Falls in the past year? 1 0 0 0 1  Was there an injury with Fall? 1  0 0 0  Fall Risk Category Calculator 3  0 0 2  Fall Risk Category (Retired) High  Low    (RETIRED) Patient Fall Risk Level High fall risk Low fall risk Low fall risk    Patient at Risk for Falls Due to History of fall(s);Impaired balance/gait;Impaired mobility;Mental status change No Fall Risks No Fall Risks No Fall Risks   Fall risk Follow up Falls evaluation completed;Education provided;Falls prevention discussed Falls evaluation completed Falls evaluation completed Falls evaluation completed Falls evaluation completed       04/19/2023   10:46 AM  Depression screen PHQ 2/9  Decreased Interest 2  Down, Depressed, Hopeless 3  PHQ - 2 Score 5  Altered sleeping 2  Tired, decreased energy 3  Change in appetite 1  Feeling bad or failure about yourself  0  Trouble concentrating 3  Moving slowly or fidgety/restless 2  Suicidal thoughts 0  PHQ-9 Score 16  Difficult doing work/chores Very difficult  10/01/2021    4:21 PM 06/11/2021    4:39 PM  GAD 7 : Generalized Anxiety Score  Nervous, Anxious, on Edge    Control/stop worrying    Worry too much - different things    Trouble relaxing    Restless    Easily annoyed or irritable    Afraid - awful might happen    Total GAD 7 Score       Information is confidential and restricted. Go to Review Flowsheets to unlock data.      Current Medication: Outpatient Encounter Medications as of 04/19/2023  Medication Sig   acetaminophen (TYLENOL) 500 MG tablet Take 500 mg by mouth every 6 (six) hours as needed.   albuterol (VENTOLIN  HFA) 108 (90 Base) MCG/ACT inhaler Inhale 2 puffs into the lungs every 6 (six) hours as needed for wheezing or shortness of breath.   Cholecalciferol (VITAMIN D3) 5000 units TABS Take 5,000 Units by mouth daily.   escitalopram (LEXAPRO) 10 MG tablet TAKE 1 TABLET BY MOUTH DAILY   fluticasone (FLONASE) 50 MCG/ACT nasal spray Place 1 spray into both nostrils daily. 1 SPRAY IN EACH NOSTRIL ONCE DAILY AS NEEDED   magnesium oxide (MAG-OX) 400 MG tablet Take 1 tablet (400 mg total) by mouth at bedtime. At bedtime   Menthol-Methyl Salicylate (SALONPAS PAIN RELIEF PATCH) PTCH Apply 1 patch topically daily as needed (Pain).   mupirocin ointment (BACTROBAN) 2 % Apply 1 Application topically daily. To abrasions scattered on skin until healed.   polyethylene glycol powder (GLYCOLAX/MIRALAX) 17 GM/SCOOP powder Take 17 g by mouth daily as needed for mild constipation or moderate constipation.   promethazine (PHENERGAN) 12.5 MG tablet Take 1 tablet (12.5 mg total) by mouth every 6 (six) hours as needed for nausea or vomiting.   rOPINIRole (REQUIP) 1 MG tablet Take 1 tablet by mouth in am and 2 tablets by mouth at bedtime   traZODone (DESYREL) 50 MG tablet Take 1 tablet (50 mg total) by mouth at bedtime.   triamcinolone cream (KENALOG) 0.1 % Apply 1 Application topically 2 (two) times daily. To rash area until resolved.   [DISCONTINUED] bisoprolol-hydrochlorothiazide (ZIAC) 5-6.25 MG tablet Take 1 tablet by mouth daily.   [DISCONTINUED] furosemide (LASIX) 40 MG tablet Take 1 tablet (40 mg total) by mouth daily.   [DISCONTINUED] traMADol (ULTRAM) 50 MG tablet Take 1 tablet (50 mg total) by mouth in the morning, at noon, and at bedtime.   [DISCONTINUED] bisoprolol-hydrochlorothiazide (ZIAC) 5-6.25 MG tablet Take 1 tablet by mouth daily.   [DISCONTINUED] furosemide (LASIX) 40 MG tablet Take 1 tablet (40 mg total) by mouth daily.   [EXPIRED] cyanocobalamin (VITAMIN B12) injection 1,000 mcg    No facility-administered  encounter medications on file as of 04/19/2023.    Surgical History: Past Surgical History:  Procedure Laterality Date   COLONOSCOPY  05-03-15   COLONOSCOPY WITH PROPOFOL N/A 05/03/2015   Procedure: COLONOSCOPY WITH PROPOFOL;  Surgeon: Midge Minium, MD;  Location: [facility];  Service: Endoscopy;  Laterality: N/A;  CPAP   DILATION AND CURETTAGE OF UTERUS  1970   ENDOMETRIAL BIOPSY     EYE SURGERY Right 2017   POLYPECTOMY  05/03/2015   Procedure: POLYPECTOMY;  Surgeon: Midge Minium, MD;  Location: [facility];  Service: Endoscopy;;   ROTATOR CUFF REPAIR  2007   SPINE SURGERY      Medical History: Past Medical History:  Diagnosis Date   Arthritis    Atrophic vaginitis 03/06/2013   Last Assessment &  Plan:  She will plan to continue estradiol vaginal cream.  Prescription was rewritten stipulating use of the generic product.    Atypical migraine 03/06/2013   Last Assessment & Plan:  Since she rarely takes sumatriptan, we will discontinue it. Continue sparing use of OTC migraine products.    Avitaminosis D 03/06/2013   Last Assessment & Plan:  Recheck vitamin D level. Plan to continue vitamin D supplementation.    Ceratitis 08/01/2013   Last Assessment & Plan:  Because of the expense, she will discontinue Restasis. She will use over-the-counter moisturizing eyedrops and liquid gel.    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    Cystocele, midline 03/06/2013   Demoralization and apathy 03/06/2013   Last Assessment & Plan:  Since she has been taking bupropion only once a day, I will write the prescription with the correct instructions and correct quantity. She has done well on this for years and she is encouraged to take it regularly     Depression    Environmental allergies    Epistaxis    Essential (primary) hypertension 05/17/2013   Last Assessment & Plan:  Her blood pressure is well-controlled. We will plan to continue hydrochlorothiazide and benazepril.  Both are generic and should be affordable on her health plan.    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 We will continue that regimen.    Sinusitis    Sleep apnea    CPAP    Family History: Family History  Problem Relation Age of Onset   Arthritis Mother    Alzheimer's disease Father    Heart disease Sister    Cancer Brother    Ovarian cancer Neg Hx    Breast cancer Neg Hx    Colon cancer Neg Hx    Diabetes Neg Hx     Social History   Socioeconomic History   Marital status: Widowed    Spouse name: Not on file   Number of children: 2   Years of education: 71   Highest education level: Some college, no degree  Occupational History   Not on file  Tobacco Use   Smoking status: Never    Passive exposure: Never   Smokeless tobacco: Never  Vaping Use   Vaping status: Never Used  Substance and Sexual Activity   Alcohol use: No    Alcohol/week: 0.0 standard drinks of alcohol   Drug use: No   Sexual activity: Not Currently  Other Topics Concern   Not on file  Social History Narrative   Not on file   Social Determinants of Health   Financial Resource Strain: Low Risk  (04/19/2023)   Overall Financial Resource Strain (CARDIA)    Difficulty of Paying Living Expenses: Not hard at all  Food Insecurity: No Food Insecurity (04/19/2023)   Hunger Vital Sign    Worried About Running Out of Food in the Last Year: Never true    Ran Out of Food in the Last Year: Never true  Transportation Needs: No Transportation Needs (04/19/2023)   PRAPARE -  Administrator, Civil Service (Medical): No    Lack of Transportation (Non-Medical): No  Physical Activity: Inactive (04/19/2023)   Exercise Vital Sign    Days of Exercise per Week: 0 days    Minutes of Exercise per Session: 0 min  Stress: Stress Concern Present (04/19/2023)   [employer] of Occupational Health - Occupational Stress Questionnaire    Feeling of Stress : To some extent  Social Connections: Moderately Isolated (04/19/2023)   Social Connection and Isolation Panel [NHANES]    Frequency of Communication with Friends and Family: Twice a week    Frequency of Social Gatherings with Friends and Family: Once a week    Attends Religious Services: 1 to 4 times per year    Active Member of [employer] or Organizations: No    Attends Banker Meetings: Never    Marital Status: Widowed  Intimate Partner Violence: Not At Risk (04/19/2023)   Humiliation, Afraid, Rape, and Kick questionnaire    Fear of Current or Ex-Partner: No    Emotionally Abused: No    Physically Abused: No    Sexually Abused: No      Review of Systems  Constitutional:  Negative for chills, diaphoresis and fatigue.  HENT:  Negative for ear pain, postnasal drip and sinus pressure.   Eyes:  Negative for photophobia, discharge, redness, itching and visual disturbance.  Respiratory:  Negative for cough, shortness of breath and wheezing.   Cardiovascular:  Negative for chest pain, palpitations and leg swelling.  Gastrointestinal:  Negative for abdominal pain, constipation, diarrhea, nausea and vomiting.  Genitourinary:  Negative for dysuria and flank pain.  Musculoskeletal:  Negative for arthralgias, back pain, gait problem and neck pain.  Skin:  Negative for color change.  Allergic/Immunologic: Negative for environmental allergies and food allergies.  Neurological:  Negative for dizziness and headaches.  Hematological:  Does not bruise/bleed easily.  Psychiatric/Behavioral:  Positive for  behavioral problems (depression -- takes lexapro), depression and sleep disturbance (takes trazodone). Negative for agitation, hallucinations, self-injury and suicidal ideas. The patient is nervous/anxious (takes lexapro).     Vital Signs: BP 136/70 Comment: 146/74  Pulse 65   Temp 98.6 F (37 C)   Resp 16   Ht 5\' 4"  (1.626 m)   Wt 129 lb (58.5 kg)   SpO2 95%   BMI 22.14 kg/m    Physical Exam Vitals reviewed.  Constitutional:      General: She is not in acute distress.    Appearance: Normal appearance. She is well-developed and normal weight. She is not ill-appearing or diaphoretic.  HENT:     Head: Normocephalic and atraumatic.     Right Ear: Tympanic membrane, ear canal and external ear normal.     Left Ear: Tympanic membrane, ear canal and external ear normal.     Nose: Nose normal.     Mouth/Throat:     Mouth: Mucous membranes are moist.     Dentition: Abnormal dentition (missing teeth, partial plate).     Pharynx: Oropharynx is clear. No oropharyngeal exudate or posterior oropharyngeal erythema.  Eyes:     Extraocular Movements: Extraocular movements intact.     Conjunctiva/sclera: Conjunctivae normal.     Pupils: Pupils are equal, round, and reactive to light.  Neck:     Thyroid: No thyromegaly.     Vascular: No JVD.     Trachea: No tracheal deviation.  Cardiovascular:     Rate and Rhythm: Normal rate and regular rhythm.  Pulses: Normal pulses.     Heart sounds: Normal heart sounds. No murmur heard.    No friction rub. No gallop.  Pulmonary:     Effort: Pulmonary effort is normal. No respiratory distress.     Breath sounds: No wheezing or rales.  Chest:     Chest wall: No tenderness.  Abdominal:     General: Bowel sounds are normal.     Palpations: Abdomen is soft.  Musculoskeletal:        General: Normal range of motion.     Cervical back: Normal range of motion and neck supple.  Lymphadenopathy:     Cervical: No cervical adenopathy.  Skin:     General: Skin is warm and dry.     Capillary Refill: Capillary refill takes less than 2 seconds.     Comments: Skin tear on arm  Neurological:     Mental Status: She is alert and oriented to person, place, and time.     Cranial Nerves: No cranial nerve deficit.  Psychiatric:        Mood and Affect: Mood normal.        Behavior: Behavior normal.        Thought Content: Thought content normal.        Judgment: Judgment normal.        Assessment/Plan: 1. Encounter for subsequent annual wellness visit (AWV) in Medicare patient Age-appropriate preventive screenings and vaccinations discussed, annual physical exam completed. Routine labs for health maintenance previously reviewed with patient. PHM updated.   2. Acute on chronic systolic congestive heart failure (HCC) Continue lasix as prescribed, will continue to monitor patient periodically. Consider cardiology referral if worsening symptoms despite medication adjustments.   3. Essential (primary) hypertension Stable, Continue medications as prescribed   4. B12 deficiency B12 injection administered in office today  - cyanocobalamin (VITAMIN B12) injection 1,000 mcg  5. Infected skin tear Mupirocin ointment prescribed.  - mupirocin ointment (BACTROBAN) 2 %; Apply 1 Application topically daily. To abrasions scattered on skin until healed.  Dispense: 22 g; Refill: 0  6. Needs flu shot Flu vaccine administered in office today - Influenza, MDCK, trivalent, PF(Flucelvax egg-free)      General Counseling: avis blomgren understanding of the findings of todays visit and agrees with plan of treatment. I have discussed any further diagnostic evaluation that may be needed or ordered today. We also reviewed her medications today. she has been encouraged to call the office with any questions or concerns that should arise related to todays visit.    Orders Placed This Encounter  Procedures   Influenza, MDCK, trivalent, PF(Flucelvax  egg-free)    Meds ordered this encounter  Medications   cyanocobalamin (VITAMIN B12) injection 1,000 mcg   mupirocin ointment (BACTROBAN) 2 %    Sig: Apply 1 Application topically daily. To abrasions scattered on skin until healed.    Dispense:  22 g    Refill:  0   DISCONTD: furosemide (LASIX) 40 MG tablet    Sig: Take 1 tablet (40 mg total) by mouth daily.    Dispense:  30 tablet    Refill:  3   DISCONTD: bisoprolol-hydrochlorothiazide (ZIAC) 5-6.25 MG tablet    Sig: Take 1 tablet by mouth daily.    Dispense:  30 tablet    Refill:  6    Return in about 2 months (around 06/19/2023) for F/U, Markeese Boyajian PCP.   Total time spent:30 Minutes Time spent includes review of chart, medications, test results, and follow up  plan with the patient.   [place] Controlled Substance Database was reviewed by me.  This patient was seen by Sallyanne Kuster, FNP-C in collaboration with Dr. Beverely Risen as a part of collaborative care agreement.  Quindell Shere R. Tedd Sias, MSN, FNP-C Internal medicine

## 2023-05-01 ENCOUNTER — Encounter: Payer: Self-pay | Admitting: Nurse Practitioner

## 2023-05-11 ENCOUNTER — Encounter: Payer: Self-pay | Admitting: Nurse Practitioner

## 2023-05-11 ENCOUNTER — Telehealth (INDEPENDENT_AMBULATORY_CARE_PROVIDER_SITE_OTHER): Payer: Medicare HMO | Admitting: Nurse Practitioner

## 2023-05-11 VITALS — Resp 16 | Ht 64.0 in | Wt 130.0 lb

## 2023-05-11 DIAGNOSIS — G8929 Other chronic pain: Secondary | ICD-10-CM

## 2023-05-11 DIAGNOSIS — I5022 Chronic systolic (congestive) heart failure: Secondary | ICD-10-CM | POA: Diagnosis not present

## 2023-05-11 DIAGNOSIS — I10 Essential (primary) hypertension: Secondary | ICD-10-CM | POA: Diagnosis not present

## 2023-05-11 MED ORDER — TRAMADOL HCL 50 MG PO TABS
50.0000 mg | ORAL_TABLET | Freq: Four times a day (QID) | ORAL | 2 refills | Status: DC
Start: 2023-05-11 — End: 2023-09-01

## 2023-05-11 MED ORDER — BISOPROLOL-HYDROCHLOROTHIAZIDE 5-6.25 MG PO TABS: 1.0000 | ORAL_TABLET | Freq: Every day | ORAL | 6 refills | Status: DC

## 2023-05-11 MED ORDER — FUROSEMIDE 40 MG PO TABS
40.0000 mg | ORAL_TABLET | Freq: Every day | ORAL | 11 refills | Status: DC
Start: 2023-05-11 — End: 2024-04-24

## 2023-05-11 NOTE — Progress Notes (Signed)
[facility] [address] [place], [place] [address]  Internal MEDICINE  Telephone Visit  Patient Name: Kristen Pennington  [id number]  [id number]  Date of Service: 05/11/2023  I connected with the patient at 1645 by telephone and verified the patients identity using two identifiers.   I discussed the limitations, risks, security and privacy concerns of performing an evaluation and management service by telephone and the availability of in person appointments. I also discussed with the patient that there may be a patient responsible charge related to the service.  The patient expressed understanding and agrees to proceed.    Chief Complaint  Patient presents with   Telephone Screen    Discuss med/back pain   Telephone Assessment    HPI Kristen Pennington presents for a telehealth virtual visit for medications and back pain Wanted to clarify her medications -- was taking extra bisoprolol-hydrochlorothiazide but has since stopped, now she is only taking 1 tablet daily instead of 2 tablets.  Still taking furosemide 40 mg daily. Wants to go back to taking tramadol 4 times daily for back pain.    Current Medication: Outpatient Encounter Medications as of 05/11/2023  Medication Sig   acetaminophen (TYLENOL) 500 MG tablet Take 500 mg by mouth every 6 (six) hours as needed.   albuterol (VENTOLIN HFA) 108 (90 Base) MCG/ACT inhaler Inhale 2 puffs into the lungs every 6 (six) hours as needed for wheezing or shortness of breath.   Cholecalciferol (VITAMIN D3) 5000 units TABS Take 5,000 Units by mouth daily.   escitalopram (LEXAPRO) 10 MG tablet TAKE 1 TABLET BY MOUTH DAILY   fluticasone (FLONASE) 50 MCG/ACT nasal spray Place 1 spray into both nostrils daily. 1 SPRAY IN EACH NOSTRIL ONCE DAILY AS NEEDED   magnesium oxide (MAG-OX) 400 MG tablet Take 1 tablet (400 mg total) by mouth at bedtime. At bedtime   Menthol-Methyl Salicylate (SALONPAS PAIN RELIEF PATCH) PTCH Apply 1 patch topically daily as  needed (Pain).   mupirocin ointment (BACTROBAN) 2 % Apply 1 Application topically daily. To abrasions scattered on skin until healed.   polyethylene glycol powder (GLYCOLAX/MIRALAX) 17 GM/SCOOP powder Take 17 g by mouth daily as needed for mild constipation or moderate constipation.   promethazine (PHENERGAN) 12.5 MG tablet Take 1 tablet (12.5 mg total) by mouth every 6 (six) hours as needed for nausea or vomiting.   rOPINIRole (REQUIP) 1 MG tablet Take 1 tablet by mouth in am and 2 tablets by mouth at bedtime   traZODone (DESYREL) 50 MG tablet Take 1 tablet (50 mg total) by mouth at bedtime.   triamcinolone cream (KENALOG) 0.1 % Apply 1 Application topically 2 (two) times daily. To rash area until resolved.   [DISCONTINUED] bisoprolol-hydrochlorothiazide (ZIAC) 5-6.25 MG tablet Take 1 tablet by mouth daily.   [DISCONTINUED] furosemide (LASIX) 40 MG tablet Take 1 tablet (40 mg total) by mouth daily.   [DISCONTINUED] traMADol (ULTRAM) 50 MG tablet Take 1 tablet (50 mg total) by mouth in the morning, at noon, and at bedtime.   bisoprolol-hydrochlorothiazide (ZIAC) 5-6.25 MG tablet Take 1 tablet by mouth daily.   furosemide (LASIX) 40 MG tablet Take 1 tablet (40 mg total) by mouth daily.   traMADol (ULTRAM) 50 MG tablet Take 1 tablet (50 mg total) by mouth 4 (four) times daily.   No facility-administered encounter medications on file as of 05/11/2023.    Surgical History: Past Surgical History:  Procedure Laterality Date   COLONOSCOPY  05-03-15   COLONOSCOPY WITH PROPOFOL N/A 05/03/2015  Procedure: COLONOSCOPY WITH PROPOFOL;  Surgeon: Midge Minium, MD;  Location: [facility];  Service: Endoscopy;  Laterality: N/A;  CPAP   DILATION AND CURETTAGE OF UTERUS  1970   ENDOMETRIAL BIOPSY     EYE SURGERY Right 2017   POLYPECTOMY  05/03/2015   Procedure: POLYPECTOMY;  Surgeon: Midge Minium, MD;  Location: [facility];  Service: Endoscopy;;   ROTATOR CUFF REPAIR  2007   SPINE SURGERY       Medical History: Past Medical History:  Diagnosis Date   Arthritis    Atrophic vaginitis 03/06/2013   Last Assessment & Plan:  She will plan to continue estradiol vaginal cream.  Prescription was rewritten stipulating use of the generic product.    Atypical migraine 03/06/2013   Last Assessment & Plan:  Since she rarely takes sumatriptan, we will discontinue it. Continue sparing use of OTC migraine products.    Avitaminosis D 03/06/2013   Last Assessment & Plan:  Recheck vitamin D level. Plan to continue vitamin D supplementation.    Ceratitis 08/01/2013   Last Assessment & Plan:  Because of the expense, she will discontinue Restasis. She will use over-the-counter moisturizing eyedrops and liquid gel.    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    Cystocele, midline 03/06/2013   Demoralization and apathy 03/06/2013   Last Assessment & Plan:  Since she has been taking bupropion only once a day, I will write the prescription with the correct instructions and correct quantity. She has done well on this for years and she is encouraged to take it regularly    Depression    Environmental allergies    Epistaxis    Essential (primary) hypertension 05/17/2013   Last Assessment & Plan:  Her blood pressure is well-controlled. We will plan to continue hydrochlorothiazide and benazepril.  Both are generic and should be affordable on her health plan.    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 We will continue that regimen.    Sinusitis    Sleep apnea    CPAP     Family History: Family History  Problem Relation Age of Onset   Arthritis Mother    Alzheimer's disease Father    Heart disease Sister    Cancer Brother    Ovarian cancer Neg Hx    Breast cancer Neg Hx    Colon cancer Neg Hx    Diabetes Neg Hx     Social History   Socioeconomic History   Marital status: Widowed    Spouse name: Not on file   Number of children: 2   Years of education: 81   Highest education level: Some college, no degree  Occupational History   Not on file  Tobacco Use   Smoking status: Never    Passive exposure: Never   Smokeless tobacco: Never  Vaping Use   Vaping status: Never Used  Substance and Sexual Activity   Alcohol use: No    Alcohol/week: 0.0 standard drinks of alcohol   Drug use: No   Sexual activity: Not  Currently  Other Topics Concern   Not on file  Social History Narrative   Not on file   Social Determinants of Health   Financial Resource Strain: Low Risk  (04/19/2023)   Overall Financial Resource Strain (CARDIA)    Difficulty of Paying Living Expenses: Not hard at all  Food Insecurity: No Food Insecurity (04/19/2023)   Hunger Vital Sign    Worried About Running Out of Food in the Last Year: Never true    Ran Out of Food in the Last Year: Never true  Transportation Needs: No Transportation Needs (04/19/2023)   PRAPARE - Administrator, Civil Service (Medical): No    Lack of Transportation (Non-Medical): No  Physical Activity: Inactive (04/19/2023)   Exercise Vital Sign    Days of Exercise per Week: 0 days    Minutes of Exercise per Session: 0 min  Stress: Stress Concern Present (04/19/2023)   [employer] of Occupational Health - Occupational Stress Questionnaire    Feeling of Stress : To some extent  Social Connections: Moderately Isolated (04/19/2023)   Social Connection and Isolation Panel [NHANES]    Frequency of Communication with Friends and Family: Twice a week    Frequency of Social Gatherings with  Friends and Family: Once a week    Attends Religious Services: 1 to 4 times per year    Active Member of [employer] or Organizations: No    Attends Banker Meetings: Never    Marital Status: Widowed  Intimate Partner Violence: Not At Risk (04/19/2023)   Humiliation, Afraid, Rape, and Kick questionnaire    Fear of Current or Ex-Partner: No    Emotionally Abused: No    Physically Abused: No    Sexually Abused: No      Review of Systems  Constitutional:  Negative for chills, fatigue and unexpected weight change.  HENT:  Negative for congestion, postnasal drip, rhinorrhea, sneezing and sore throat.   Eyes:  Negative for redness.  Respiratory: Negative.  Negative for cough, chest tightness, shortness of breath and wheezing.   Cardiovascular: Negative.  Negative for chest pain and palpitations.  Gastrointestinal: Negative.  Negative for abdominal pain, constipation, diarrhea, nausea and vomiting.  Genitourinary:  Negative for dysuria and frequency.  Musculoskeletal:  Positive for arthralgias and back pain. Negative for joint swelling and neck pain.  Skin:  Negative for rash.  Neurological: Negative.  Negative for tremors and numbness.  Hematological:  Negative for adenopathy. Does not bruise/bleed easily.  Psychiatric/Behavioral:  Negative for self-injury, sleep disturbance and suicidal ideas. Behavioral problem: Depression.The patient is nervous/anxious.        Feeling lonely    Vital Signs: Resp 16   Ht 5\' 4"  (1.626 m)   Wt 130 lb (59 kg)   BMI 22.31 kg/m    Observation/Objective: She is alert and oriented. No acute distress noted.     Assessment/Plan: 1. Chronic primary generalized pain Continue tramadol as prescribed 4 times daily. Patient unable to read, so medications cannot be prn, need to be in pill pack per patient request  - traMADol (ULTRAM) 50 MG tablet; Take 1 tablet (50 mg total) by mouth 4 (four) times daily.  Dispense: 120 tablet; Refill: 2  2.  Essential (primary) hypertension Continue bisoprolol-hydrochlorothiazide as prescribed - bisoprolol-hydrochlorothiazide (ZIAC) 5-6.25 MG tablet; Take 1 tablet by mouth daily.  Dispense: 30 tablet; Refill: 6  3. Chronic systolic congestive heart failure (HCC) Continue furosemide as prescribed.  - furosemide (LASIX) 40 MG tablet; Take 1  tablet (40 mg total) by mouth daily.  Dispense: 30 tablet; Refill: 11   General Counseling: Kristen Pennington understanding of the findings of today's phone visit and agrees with plan of treatment. I have discussed any further diagnostic evaluation that may be needed or ordered today. We also reviewed her medications today. she has been encouraged to call the office with any questions or concerns that should arise related to todays visit.  Return for previously scheduled, Anastyn Ayars PCP in december.   No orders of the defined types were placed in this encounter.   Meds ordered this encounter  Medications   traMADol (ULTRAM) 50 MG tablet    Sig: Take 1 tablet (50 mg total) by mouth 4 (four) times daily.    Dispense:  120 tablet    Refill:  2    Note increase to 4 times daily, Asap with next pill pack delivery.   bisoprolol-hydrochlorothiazide (ZIAC) 5-6.25 MG tablet    Sig: Take 1 tablet by mouth daily.    Dispense:  30 tablet    Refill:  6    Please make sure medication is in her pill pack   furosemide (LASIX) 40 MG tablet    Sig: Take 1 tablet (40 mg total) by mouth daily.    Dispense:  30 tablet    Refill:  11    Please make sure medication is in her pill pack    Time spent:20 Minutes Time spent with patient included reviewing progress notes, labs, imaging studies, and discussing plan for follow up.  [place] Controlled Substance Database was reviewed by me for overdose risk score (ORS) if appropriate.  This patient was seen by Sallyanne Kuster, FNP-C in collaboration with Dr. Beverely Risen as a part of collaborative care agreement.  Kristen Pennington R. Tedd Sias,  MSN, FNP-C Internal medicine

## 2023-06-05 ENCOUNTER — Encounter: Payer: Self-pay | Admitting: Nurse Practitioner

## 2023-06-14 ENCOUNTER — Encounter: Payer: Self-pay | Admitting: Nurse Practitioner

## 2023-06-14 ENCOUNTER — Ambulatory Visit (INDEPENDENT_AMBULATORY_CARE_PROVIDER_SITE_OTHER): Payer: Medicare HMO | Admitting: Nurse Practitioner

## 2023-06-14 VITALS — BP 120/65 | HR 83 | Temp 96.7°F | Resp 16 | Ht 64.0 in | Wt 131.6 lb

## 2023-06-14 DIAGNOSIS — I10 Essential (primary) hypertension: Secondary | ICD-10-CM | POA: Diagnosis not present

## 2023-06-14 DIAGNOSIS — F411 Generalized anxiety disorder: Secondary | ICD-10-CM | POA: Diagnosis not present

## 2023-06-14 DIAGNOSIS — G8929 Other chronic pain: Secondary | ICD-10-CM | POA: Diagnosis not present

## 2023-06-14 DIAGNOSIS — I5022 Chronic systolic (congestive) heart failure: Secondary | ICD-10-CM | POA: Insufficient documentation

## 2023-06-14 MED ORDER — ALBUTEROL SULFATE HFA 108 (90 BASE) MCG/ACT IN AERS: 2.0000 | INHALATION_SPRAY | Freq: Four times a day (QID) | RESPIRATORY_TRACT | 2 refills | Status: DC | PRN

## 2023-06-14 NOTE — Progress Notes (Signed)
[facility] [address] [place], [place] [address]  Internal MEDICINE  Office Visit Note  Patient Name: Kristen Pennington  [id number]  [id number]  Date of Service: 06/14/2023  Chief Complaint  Patient presents with   Depression   Gastroesophageal Reflux   Hyperlipidemia   Hypertension   Follow-up    HPI Jhene presents for a follow-up visit for Spent thanksgiving with her daughter and family who visited her. Feels like things are looking better and her stress is decreasing. Taking her medication as prescribed.  Hypertension -- BP elevated initially, rechecked.  Joint pains have improved with changing the tramadol back to 4 times a day.  Feels like she is sleeping too much due to her stress level but could be side effects of the trazodone. Patient will try to take a half tablet.     Current Medication: Outpatient Encounter Medications as of 06/14/2023  Medication Sig   acetaminophen (TYLENOL) 500 MG tablet Take 500 mg by mouth every 6 (six) hours as needed.   bisoprolol-hydrochlorothiazide (ZIAC) 5-6.25 MG tablet Take 1 tablet by mouth daily.   Cholecalciferol (VITAMIN D3) 5000 units TABS Take 5,000 Units by mouth daily.   escitalopram (LEXAPRO) 10 MG tablet TAKE 1 TABLET BY MOUTH DAILY   fluticasone (FLONASE) 50 MCG/ACT nasal spray Place 1 spray into both nostrils daily. 1 SPRAY IN EACH NOSTRIL ONCE DAILY AS NEEDED   furosemide (LASIX) 40 MG tablet Take 1 tablet (40 mg total) by mouth daily.   magnesium oxide (MAG-OX) 400 MG tablet Take 1 tablet (400 mg total) by mouth at bedtime. At bedtime   Menthol-Methyl Salicylate (SALONPAS PAIN RELIEF PATCH) PTCH Apply 1 patch topically daily as needed (Pain).   mupirocin ointment (BACTROBAN) 2 % Apply 1 Application topically daily. To abrasions scattered on skin until healed.   polyethylene glycol powder (GLYCOLAX/MIRALAX) 17 GM/SCOOP powder Take 17 g by mouth daily as needed for mild constipation or moderate constipation.    promethazine (PHENERGAN) 12.5 MG tablet Take 1 tablet (12.5 mg total) by mouth every 6 (six) hours as needed for nausea or vomiting.   rOPINIRole (REQUIP) 1 MG tablet Take 1 tablet by mouth in am and 2 tablets by mouth at bedtime   traMADol (ULTRAM) 50 MG tablet Take 1 tablet (50 mg total) by mouth 4 (four) times daily.   traZODone (DESYREL) 50 MG tablet Take 1 tablet (50 mg total) by mouth at bedtime.   triamcinolone cream (KENALOG) 0.1 % Apply 1 Application topically 2 (two) times daily. To rash area until resolved.   [DISCONTINUED] albuterol (VENTOLIN HFA) 108 (90 Base) MCG/ACT inhaler Inhale 2 puffs into the lungs every 6 (six) hours as needed for wheezing or shortness of breath.   albuterol (VENTOLIN HFA) 108 (90 Base) MCG/ACT inhaler Inhale 2 puffs into the lungs every 6 (six) hours as needed for wheezing or shortness of breath.   No facility-administered encounter medications on file as of 06/14/2023.    Surgical History: Past Surgical History:  Procedure Laterality Date   COLONOSCOPY  05-03-15   COLONOSCOPY WITH PROPOFOL N/A 05/03/2015   Procedure: COLONOSCOPY WITH PROPOFOL;  Surgeon: Midge Minium, MD;  Location: [facility];  Service: Endoscopy;  Laterality: N/A;  CPAP   DILATION AND CURETTAGE OF UTERUS  1970   ENDOMETRIAL BIOPSY     EYE SURGERY Right 2017   POLYPECTOMY  05/03/2015   Procedure: POLYPECTOMY;  Surgeon: Midge Minium, MD;  Location: [facility];  Service: Endoscopy;;   ROTATOR  CUFF REPAIR  2007   SPINE SURGERY      Medical History: Past Medical History:  Diagnosis Date   Arthritis    Atrophic vaginitis 03/06/2013   Last Assessment & Plan:  She will plan to continue estradiol vaginal cream.  Prescription was rewritten stipulating use of the generic product.    Atypical migraine 03/06/2013   Last Assessment & Plan:  Since she rarely takes sumatriptan, we will discontinue it. Continue sparing use of OTC migraine products.    Avitaminosis D 03/06/2013    Last Assessment & Plan:  Recheck vitamin D level. Plan to continue vitamin D supplementation.    Ceratitis 08/01/2013   Last Assessment & Plan:  Because of the expense, she will discontinue Restasis. She will use over-the-counter moisturizing eyedrops and liquid gel.    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    Cystocele, midline 03/06/2013   Demoralization and apathy 03/06/2013   Last Assessment & Plan:  Since she has been taking bupropion only once a day, I will write the prescription with the correct instructions and correct quantity. She has done well on this for years and she is encouraged to take it regularly    Depression    Environmental allergies    Epistaxis    Essential (primary) hypertension 05/17/2013   Last Assessment & Plan:  Her blood pressure is well-controlled. We will plan to continue hydrochlorothiazide and benazepril.  Both are generic and should be affordable on her health plan.    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 We will continue that regimen.    Sinusitis    Sleep apnea    CPAP    Family History: Family History  Problem Relation Age of Onset   Arthritis Mother    Alzheimer's disease Father    Heart disease Sister    Cancer Brother    Ovarian cancer Neg Hx    Breast cancer Neg Hx    Colon cancer Neg Hx    Diabetes Neg Hx     Social History   Socioeconomic History   Marital status: Widowed    Spouse name: Not on file   Number of children: 2   Years of education: 81   Highest education level: Some college,  no degree  Occupational History   Not on file  Tobacco Use   Smoking status: Never    Passive exposure: Never   Smokeless tobacco: Never  Vaping Use   Vaping status: Never Used  Substance and Sexual Activity   Alcohol use: No    Alcohol/week: 0.0 standard drinks of alcohol   Drug use: No   Sexual activity: Not Currently  Other Topics Concern   Not on file  Social History Narrative   Not on file   Social Determinants of Health   Financial Resource Strain: Low Risk  (04/19/2023)   Overall Financial Resource Strain (CARDIA)    Difficulty of Paying Living Expenses: Not hard at all  Food Insecurity: No Food Insecurity (04/19/2023)   Hunger Vital Sign  Worried About Programme researcher, broadcasting/film/video in the Last Year: Never true    Ran Out of Food in the Last Year: Never true  Transportation Needs: No Transportation Needs (04/19/2023)   PRAPARE - Administrator, Civil Service (Medical): No    Lack of Transportation (Non-Medical): No  Physical Activity: Inactive (04/19/2023)   Exercise Vital Sign    Days of Exercise per Week: 0 days    Minutes of Exercise per Session: 0 min  Stress: Stress Concern Present (04/19/2023)   [employer] of Occupational Health - Occupational Stress Questionnaire    Feeling of Stress : To some extent  Social Connections: Moderately Isolated (04/19/2023)   Social Connection and Isolation Panel [NHANES]    Frequency of Communication with Friends and Family: Twice a week    Frequency of Social Gatherings with Friends and Family: Once a week    Attends Religious Services: 1 to 4 times per year    Active Member of [employer] or Organizations: No    Attends Banker Meetings: Never    Marital Status: Widowed  Intimate Partner Violence: Not At Risk (04/19/2023)   Humiliation, Afraid, Rape, and Kick questionnaire    Fear of Current or Ex-Partner: No    Emotionally Abused: No    Physically Abused: No    Sexually Abused: No      Review of Systems   Constitutional:  Negative for chills, fatigue and unexpected weight change.  HENT:  Negative for congestion, postnasal drip, rhinorrhea, sneezing and sore throat.   Eyes:  Negative for redness.  Respiratory: Negative.  Negative for cough, chest tightness, shortness of breath and wheezing.   Cardiovascular: Negative.  Negative for chest pain and palpitations.  Gastrointestinal: Negative.  Negative for abdominal pain, constipation, diarrhea, nausea and vomiting.  Genitourinary:  Negative for dysuria and frequency.  Musculoskeletal:  Positive for arthralgias and back pain. Negative for joint swelling and neck pain.  Skin:  Negative for rash.  Neurological: Negative.  Negative for tremors and numbness.  Hematological:  Negative for adenopathy. Does not bruise/bleed easily.  Psychiatric/Behavioral:  Positive for depression. Negative for self-injury, sleep disturbance and suicidal ideas. Behavioral problem: Depression.The patient is nervous/anxious.        Feeling lonely    Vital Signs: BP 120/65 Comment: 150/66  Pulse 83   Temp (!) 96.7 F (35.9 C)   Resp 16   Ht 5\' 4"  (1.626 m)   Wt 131 lb 9.6 oz (59.7 kg)   SpO2 96%   BMI 22.59 kg/m    Physical Exam Vitals reviewed.  Constitutional:      General: She is not in acute distress.    Appearance: Normal appearance. She is normal weight. She is not ill-appearing.  HENT:     Head: Normocephalic and atraumatic.  Eyes:     Pupils: Pupils are equal, round, and reactive to light.  Cardiovascular:     Rate and Rhythm: Normal rate and regular rhythm.  Pulmonary:     Effort: Pulmonary effort is normal. No respiratory distress.  Neurological:     Mental Status: She is alert and oriented to person, place, and time.  Psychiatric:        Mood and Affect: Mood normal.        Behavior: Behavior normal.        Assessment/Plan: 1. Essential (primary) hypertension Stable, continue bisoprolol-hydrochlorothiazide and furosemide as  prescribed.   2. Chronic primary generalized pain Stable, continue tramadol as prescribed.  3. Chronic systolic congestive heart failure (HCC) Stable, continue furosemide as prescribed.   4. Generalized anxiety disorder Continue lexapro as prescribed.    General Counseling: taeko deridder understanding of the findings of todays visit and agrees with plan of treatment. I have discussed any further diagnostic evaluation that may be needed or ordered today. We also reviewed her medications today. she has been encouraged to call the office with any questions or concerns that should arise related to todays visit.    No orders of the defined types were placed in this encounter.   Meds ordered this encounter  Medications   albuterol (VENTOLIN HFA) 108 (90 Base) MCG/ACT inhaler    Sig: Inhale 2 puffs into the lungs every 6 (six) hours as needed for wheezing or shortness of breath.    Dispense:  8 g    Refill:  2    Please deliver asap for patient for SOB    Return in about 2 months (around 08/15/2023) for F/U, Ayda Tancredi PCP.   Total time spent:30 Minutes Time spent includes review of chart, medications, test results, and follow up plan with the patient.   [place] Controlled Substance Database was reviewed by me.  This patient was seen by Sallyanne Kuster, FNP-C in collaboration with Dr. Beverely Risen as a part of collaborative care agreement.   Kaylon Hitz R. Tedd Sias, MSN, FNP-C Internal medicine

## 2023-06-28 ENCOUNTER — Telehealth: Payer: Self-pay

## 2023-06-29 ENCOUNTER — Ambulatory Visit (INDEPENDENT_AMBULATORY_CARE_PROVIDER_SITE_OTHER): Payer: Medicare HMO | Admitting: Nurse Practitioner

## 2023-06-29 ENCOUNTER — Other Ambulatory Visit: Payer: Self-pay | Admitting: Nurse Practitioner

## 2023-06-29 ENCOUNTER — Encounter: Payer: Self-pay | Admitting: Nurse Practitioner

## 2023-06-29 VITALS — BP 150/98 | HR 77 | Temp 98.3°F | Resp 16 | Ht 64.0 in | Wt 134.0 lb

## 2023-06-29 DIAGNOSIS — L089 Local infection of the skin and subcutaneous tissue, unspecified: Secondary | ICD-10-CM

## 2023-06-29 DIAGNOSIS — M79675 Pain in left toe(s): Secondary | ICD-10-CM

## 2023-06-29 NOTE — Telephone Encounter (Signed)
As per Kristen Pennington pt advised kaley she can come today for appt

## 2023-06-29 NOTE — Progress Notes (Signed)
[facility] [address] [place], [place] [address]  Internal MEDICINE  Office Visit Note  Patient Name: Kristen Pennington  [id number]  [id number]  Date of Service: 06/29/2023  Chief Complaint  Patient presents with   Acute Visit    Infected toe     HPI Kristen Pennington presents for an acute sick visit for toe pain.  Noticed pain in the 5th toe of the left foot a few days ago and the skin is red but not swollen and not warm or hot to touch. Has a corn on the distal joint of the 5th toe. The proximal metatarsal joint is the area that is hurting the most per patient report.      Current Medication:  Outpatient Encounter Medications as of 06/29/2023  Medication Sig   acetaminophen (TYLENOL) 500 MG tablet Take 500 mg by mouth every 6 (six) hours as needed.   albuterol (VENTOLIN HFA) 108 (90 Base) MCG/ACT inhaler Inhale 2 puffs into the lungs every 6 (six) hours as needed for wheezing or shortness of breath.   bisoprolol-hydrochlorothiazide (ZIAC) 5-6.25 MG tablet Take 1 tablet by mouth daily.   Cholecalciferol (VITAMIN D3) 5000 units TABS Take 5,000 Units by mouth daily.   escitalopram (LEXAPRO) 10 MG tablet TAKE 1 TABLET BY MOUTH DAILY   estradiol (ESTRACE) 0.1 MG/GM vaginal cream USE ONE APPLICATORFUL AS DIRECTED ONCE WEEKLY AS NEEDED   fluticasone (FLONASE) 50 MCG/ACT nasal spray Place 1 spray into both nostrils daily. 1 SPRAY IN EACH NOSTRIL ONCE DAILY AS NEEDED   furosemide (LASIX) 40 MG tablet Take 1 tablet (40 mg total) by mouth daily.   magnesium oxide (MAG-OX) 400 MG tablet Take 1 tablet (400 mg total) by mouth at bedtime. At bedtime   Menthol-Methyl Salicylate (SALONPAS PAIN RELIEF PATCH) PTCH Apply 1 patch topically daily as needed (Pain).   mupirocin ointment (BACTROBAN) 2 % APPLY 1 APPLICATION TOPICALLY EVERY DAY TO ABRASIONS SCATTERED ON SKIN UNTILHEALED   polyethylene glycol powder (GLYCOLAX/MIRALAX) 17 GM/SCOOP powder Take 17 g by mouth daily as needed for mild  constipation or moderate constipation.   promethazine (PHENERGAN) 12.5 MG tablet Take 1 tablet (12.5 mg total) by mouth every 6 (six) hours as needed for nausea or vomiting.   rOPINIRole (REQUIP) 1 MG tablet Take 1 tablet by mouth in am and 2 tablets by mouth at bedtime   traMADol (ULTRAM) 50 MG tablet Take 1 tablet (50 mg total) by mouth 4 (four) times daily.   traZODone (DESYREL) 50 MG tablet Take 1 tablet (50 mg total) by mouth at bedtime.   triamcinolone cream (KENALOG) 0.1 % Apply 1 Application topically 2 (two) times daily. To rash area until resolved.   No facility-administered encounter medications on file as of 06/29/2023.      Medical History: Past Medical History:  Diagnosis Date   Arthritis    Atrophic vaginitis 03/06/2013   Last Assessment & Plan:  She will plan to continue estradiol vaginal cream.  Prescription was rewritten stipulating use of the generic product.    Atypical migraine 03/06/2013   Last Assessment & Plan:  Since she rarely takes sumatriptan, we will discontinue it. Continue sparing use of OTC migraine products.    Avitaminosis D 03/06/2013   Last Assessment & Plan:  Recheck vitamin D level. Plan to continue vitamin D supplementation.    Ceratitis 08/01/2013   Last Assessment & Plan:  Because of the expense, she will discontinue Restasis. She will use over-the-counter moisturizing eyedrops and liquid gel.  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    Cystocele, midline 03/06/2013   Demoralization and apathy 03/06/2013   Last Assessment & Plan:  Since she has been taking bupropion only once a day, I will write the prescription with the correct instructions and correct quantity. She has done well on this for years and she is  encouraged to take it regularly    Depression    Environmental allergies    Epistaxis    Essential (primary) hypertension 05/17/2013   Last Assessment & Plan:  Her blood pressure is well-controlled. We will plan to continue hydrochlorothiazide and benazepril.  Both are generic and should be affordable on her health plan.    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 We will continue that regimen.    Sinusitis    Sleep apnea    CPAP     Vital Signs: BP (!) 150/98   Pulse 77   Temp 98.3 F (36.8 C)   Resp 16   Ht 5\' 4"  (1.626 m)   Wt 134 lb (60.8 kg)   SpO2 97%   BMI 23.00 kg/m    Review of Systems  Respiratory: Negative.  Negative for cough, chest tightness, shortness of breath and wheezing.   Cardiovascular: Negative.  Negative for chest pain and palpitations.  Musculoskeletal:  Positive for arthralgias (pain of 5th toe on left foot).    Physical Exam Vitals reviewed.  Constitutional:      General: She is not in acute distress.    Appearance: Normal appearance. She is normal weight. She is not ill-appearing.  HENT:     Head: Normocephalic and atraumatic.  Eyes:     Pupils: Pupils are equal, round, and reactive to light.  Cardiovascular:     Rate and Rhythm: Normal rate and regular rhythm.  Neurological:     Mental Status: She is alert and oriented to person, place, and time.  Psychiatric:        Mood and Affect: Mood normal.        Behavior: Behavior normal.       Assessment/Plan: 1. Toe pain, left (Primary) Most likely arthritis. Declined any podiatry referral for now. Will reassess at next office visit.      General Counseling: doncella baldassarre understanding of the findings of todays visit and agrees with plan of treatment. I have discussed any further  diagnostic evaluation that may be needed or ordered today. We also reviewed her medications today. she has been encouraged to call the office with any questions or concerns that should arise related to todays visit.    Counseling:    No orders of the defined types were placed in this encounter.   No orders of the defined types were placed in this encounter.   Return if symptoms worsen or fail to improve, for has upcoming appt on 08/15/22.   Controlled Substance Database was reviewed by me for overdose risk score (ORS)  Time spent:20 Minutes Time spent with patient included reviewing progress notes, labs, imaging studies, and discussing plan for follow  up.   This patient was seen by Sallyanne Kuster, FNP-C in collaboration with Dr. Beverely Risen as a part of collaborative care agreement.  Caillou Minus R. Tedd Sias, MSN, FNP-C Internal Medicine

## 2023-08-16 ENCOUNTER — Ambulatory Visit: Payer: Medicare HMO | Admitting: Nurse Practitioner

## 2023-08-19 ENCOUNTER — Ambulatory Visit: Payer: Medicare HMO | Admitting: Nurse Practitioner

## 2023-08-24 ENCOUNTER — Encounter: Payer: Self-pay | Admitting: Internal Medicine

## 2023-08-24 ENCOUNTER — Ambulatory Visit (INDEPENDENT_AMBULATORY_CARE_PROVIDER_SITE_OTHER): Payer: Medicare HMO | Admitting: Internal Medicine

## 2023-08-24 VITALS — BP 110/73 | HR 61 | Temp 98.0°F | Resp 16 | Ht 64.0 in | Wt 131.0 lb

## 2023-08-24 DIAGNOSIS — Z9181 History of falling: Secondary | ICD-10-CM

## 2023-08-24 DIAGNOSIS — J309 Allergic rhinitis, unspecified: Secondary | ICD-10-CM

## 2023-08-24 DIAGNOSIS — E538 Deficiency of other specified B group vitamins: Secondary | ICD-10-CM

## 2023-08-24 DIAGNOSIS — H1010 Acute atopic conjunctivitis, unspecified eye: Secondary | ICD-10-CM

## 2023-08-24 MED ORDER — LEVOCETIRIZINE DIHYDROCHLORIDE 5 MG PO TABS
ORAL_TABLET | ORAL | 12 refills | Status: DC
Start: 2023-08-24 — End: 2023-10-26

## 2023-08-24 MED ORDER — CYANOCOBALAMIN 1000 MCG/ML IJ SOLN
1000.0000 ug | Freq: Once | INTRAMUSCULAR | Status: AC
Start: 2023-08-24 — End: 2023-08-24
  Administered 2023-08-24: 1000 ug via INTRAMUSCULAR

## 2023-08-24 NOTE — Progress Notes (Signed)
 [facility] [address] [place], [place] [address]  Internal MEDICINE  Office Visit Note  Patient Name: Kristen Pennington  [id number]  [id number]  Date of Service: 10/13/2023  Chief Complaint  Patient presents with   Follow-up   Depression   Gastroesophageal Reflux   Hypertension   Hyperlipidemia    HPI Pt is here for routine follow up Nasal drainage , has not found anything that helps  Health condition is stable per pt, she is just aging   Went to D, She had a rough time at hospital admission, pt might be over using her tramadol     Current Medication: Outpatient Encounter Medications as of 08/24/2023  Medication Sig   acetaminophen (TYLENOL) 500 MG tablet Take 500 mg by mouth every 6 (six) hours as needed.   albuterol (VENTOLIN HFA) 108 (90 Base) MCG/ACT inhaler Inhale 2 puffs into the lungs every 6 (six) hours as needed for wheezing or shortness of breath.   bisoprolol-hydrochlorothiazide (ZIAC) 5-6.25 MG tablet Take 1 tablet by mouth daily.   Cholecalciferol (VITAMIN D3) 5000 units TABS Take 5,000 Units by mouth daily.   estradiol (ESTRACE) 0.1 MG/GM vaginal cream USE ONE APPLICATORFUL AS DIRECTED ONCE WEEKLY AS NEEDED   fluticasone (FLONASE) 50 MCG/ACT nasal spray Place 1 spray into both nostrils daily. 1 SPRAY IN EACH NOSTRIL ONCE DAILY AS NEEDED   furosemide (LASIX) 40 MG tablet Take 1 tablet (40 mg total) by mouth daily.   levocetirizine (XYZAL) 5 MG tablet Take one tab po every day for runny nose   magnesium oxide (MAG-OX) 400 MG tablet Take 1 tablet (400 mg total) by mouth at bedtime. At bedtime   Menthol-Methyl Salicylate (SALONPAS PAIN RELIEF PATCH) PTCH Apply 1 patch topically daily as needed (Pain).   mupirocin ointment (BACTROBAN) 2 % APPLY 1 APPLICATION TOPICALLY EVERY DAY TO ABRASIONS SCATTERED ON SKIN UNTILHEALED   polyethylene glycol powder (GLYCOLAX/MIRALAX) 17 GM/SCOOP powder Take 17 g by mouth daily as needed for mild constipation or moderate  constipation.   promethazine (PHENERGAN) 12.5 MG tablet Take 1 tablet (12.5 mg total) by mouth every 6 (six) hours as needed for nausea or vomiting.   traZODone (DESYREL) 50 MG tablet Take 1 tablet (50 mg total) by mouth at bedtime.   triamcinolone cream (KENALOG) 0.1 % Apply 1 Application topically 2 (two) times daily. To rash area until resolved.   [DISCONTINUED] escitalopram (LEXAPRO) 10 MG tablet TAKE 1 TABLET BY MOUTH DAILY   [DISCONTINUED] rOPINIRole (REQUIP) 1 MG tablet Take 1 tablet by mouth in am and 2 tablets by mouth at bedtime   [DISCONTINUED] traMADol (ULTRAM) 50 MG tablet Take 1 tablet (50 mg total) by mouth 4 (four) times daily.   [EXPIRED] cyanocobalamin (VITAMIN B12) injection 1,000 mcg    No facility-administered encounter medications on file as of 08/24/2023.    Surgical History: Past Surgical History:  Procedure Laterality Date   COLONOSCOPY  05-03-15   COLONOSCOPY WITH PROPOFOL N/A 05/03/2015   Procedure: COLONOSCOPY WITH PROPOFOL;  Surgeon: Midge Minium, MD;  Location: [facility];  Service: Endoscopy;  Laterality: N/A;  CPAP   DILATION AND CURETTAGE OF UTERUS  1970   ENDOMETRIAL BIOPSY     EYE SURGERY Right 2017   POLYPECTOMY  05/03/2015   Procedure: POLYPECTOMY;  Surgeon: Midge Minium, MD;  Location: [facility];  Service: Endoscopy;;   ROTATOR CUFF REPAIR  2007   SPINE SURGERY      Medical History: Past Medical History:  Diagnosis  Date   Arthritis    Atrophic vaginitis 03/06/2013   Last Assessment & Plan:  She will plan to continue estradiol vaginal cream.  Prescription was rewritten stipulating use of the generic product.    Atypical migraine 03/06/2013   Last Assessment & Plan:  Since she rarely takes sumatriptan, we will discontinue it. Continue sparing use of OTC migraine products.    Avitaminosis D 03/06/2013   Last Assessment & Plan:  Recheck vitamin D level. Plan to continue vitamin D supplementation.    Ceratitis 08/01/2013   Last  Assessment & Plan:  Because of the expense, she will discontinue Restasis. She will use over-the-counter moisturizing eyedrops and liquid gel.    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    Cystocele, midline 03/06/2013   Demoralization and apathy 03/06/2013   Last Assessment & Plan:  Since she has been taking bupropion only once a day, I will write the prescription with the correct instructions and correct quantity. She has done well on this for years and she is encouraged to take it regularly    Depression    Environmental allergies    Epistaxis    Essential (primary) hypertension 05/17/2013   Last Assessment & Plan:  Her blood pressure is well-controlled. We will plan to continue hydrochlorothiazide and benazepril.  Both are generic and should be affordable on her health plan.    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 We will continue that regimen.    Sinusitis    Sleep apnea    CPAP    Family History: Family History  Problem Relation Age of Onset   Arthritis Mother    Alzheimer's disease Father    Heart disease Sister    Cancer Brother    Ovarian cancer Neg Hx    Breast cancer Neg Hx    Colon cancer Neg Hx    Diabetes Neg Hx     Social History   Socioeconomic History   Marital status: Widowed    Spouse name: Not on file   Number of children: 2   Years of education: 70   Highest education level: Some college, no degree  Occupational History   Not on file  Tobacco Use   Smoking status: Never    Passive exposure: Never    Smokeless tobacco: Never  Vaping Use   Vaping status: Never Used  Substance and Sexual Activity   Alcohol use: No    Alcohol/week: 0.0 standard drinks of alcohol   Drug use: No   Sexual activity: Not Currently  Other Topics Concern   Not on file  Social History Narrative   Not on file   Social Drivers of Health   Financial Resource Strain: Low Risk  (04/19/2023)   Overall Financial Resource Strain (CARDIA)    Difficulty of Paying Living Expenses: Not hard at all  Food Insecurity: No Food Insecurity (04/19/2023)   Hunger Vital Sign    Worried About Running Out of Food in the Last Year: Never true    Ran Out of Food in  the Last Year: Never true  Transportation Needs: No Transportation Needs (04/19/2023)   PRAPARE - Administrator, Civil Service (Medical): No    Lack of Transportation (Non-Medical): No  Physical Activity: Inactive (04/19/2023)   Exercise Vital Sign    Days of Exercise per Week: 0 days    Minutes of Exercise per Session: 0 min  Stress: Stress Concern Present (04/19/2023)   [employer] of Occupational Health - Occupational Stress Questionnaire    Feeling of Stress : To some extent  Social Connections: Moderately Isolated (04/19/2023)   Social Connection and Isolation Panel [NHANES]    Frequency of Communication with Friends and Family: Twice a week    Frequency of Social Gatherings with Friends and Family: Once a week    Attends Religious Services: 1 to 4 times per year    Active Member of [employer] or Organizations: No    Attends Banker Meetings: Never    Marital Status: Widowed  Intimate Partner Violence: Not At Risk (04/19/2023)   Humiliation, Afraid, Rape, and Kick questionnaire    Fear of Current or Ex-Partner: No    Emotionally Abused: No    Physically Abused: No    Sexually Abused: No      Review of Systems  Constitutional:  Negative for fatigue and fever.  HENT:  Positive for rhinorrhea. Negative for congestion, mouth sores  and sinus pain.   Respiratory:  Negative for cough.   Cardiovascular:  Negative for chest pain.  Genitourinary:  Negative for flank pain.  Psychiatric/Behavioral: Negative.      Vital Signs: BP 110/73   Pulse 61   Temp 98 F (36.7 C)   Resp 16   Ht 5\' 4"  (1.626 m)   Wt 131 lb (59.4 kg)   SpO2 97%   BMI 22.49 kg/m    Physical Exam Constitutional:      Appearance: Normal appearance.  HENT:     Head: Normocephalic and atraumatic.     Nose: Nose normal.     Mouth/Throat:     Mouth: Mucous membranes are moist.     Pharynx: No posterior oropharyngeal erythema.  Eyes:     Extraocular Movements: Extraocular movements intact.     Pupils: Pupils are equal, round, and reactive to light.  Cardiovascular:     Pulses: Normal pulses.     Heart sounds: Normal heart sounds.  Pulmonary:     Effort: Pulmonary effort is normal.     Breath sounds: Normal breath sounds.  Neurological:     General: No focal deficit present.     Mental Status: She is alert.  Psychiatric:        Mood and Affect: Mood normal.        Behavior: Behavior normal.        Assessment/Plan: 1. Allergic rhinoconjunctivitis (Primary) Add xyzal prn, continue other medications  - levocetirizine (XYZAL) 5 MG tablet; Take one tab po every day for runny nose  Dispense: 30 tablet; Refill: 12  2. B12 deficiency Continue supplement - cyanocobalamin (VITAMIN B12) injection 1,000 mcg  3. At moderate risk for fall Fall precautions addressed, continue use of walking aide    General Counseling: jonetta dagley understanding of the findings of todays visit and agrees with plan of treatment. I have discussed any further diagnostic evaluation that may be needed or ordered today. We also reviewed her medications today. she has been encouraged to call the office with any questions or concerns that should arise related to  todays visit.    No orders of the defined types were placed in this encounter.   Meds ordered this  encounter  Medications   levocetirizine (XYZAL) 5 MG tablet    Sig: Take one tab po every day for runny nose    Dispense:  30 tablet    Refill:  12   cyanocobalamin (VITAMIN B12) injection 1,000 mcg    Total time spent:35 Minutes Time spent includes review of chart, medications, test results, and follow up plan with the patient.   [place] Controlled Substance Database was reviewed by me.   Dr Lyndon Code Internal medicine

## 2023-08-31 ENCOUNTER — Other Ambulatory Visit: Payer: Self-pay | Admitting: Nurse Practitioner

## 2023-08-31 DIAGNOSIS — G8929 Other chronic pain: Secondary | ICD-10-CM

## 2023-08-31 DIAGNOSIS — F411 Generalized anxiety disorder: Secondary | ICD-10-CM

## 2023-08-31 DIAGNOSIS — F331 Major depressive disorder, recurrent, moderate: Secondary | ICD-10-CM

## 2023-08-31 NOTE — Telephone Encounter (Signed)
 Please review and send

## 2023-09-14 ENCOUNTER — Other Ambulatory Visit: Payer: Self-pay | Admitting: Nurse Practitioner

## 2023-09-14 DIAGNOSIS — Z79899 Other long term (current) drug therapy: Secondary | ICD-10-CM

## 2023-10-26 ENCOUNTER — Ambulatory Visit (INDEPENDENT_AMBULATORY_CARE_PROVIDER_SITE_OTHER): Payer: Medicare HMO | Admitting: Nurse Practitioner

## 2023-10-26 ENCOUNTER — Encounter: Payer: Self-pay | Admitting: Nurse Practitioner

## 2023-10-26 VITALS — BP 125/60 | HR 61 | Temp 98.3°F | Resp 16 | Ht 64.0 in | Wt 131.4 lb

## 2023-10-26 DIAGNOSIS — J309 Allergic rhinitis, unspecified: Secondary | ICD-10-CM

## 2023-10-26 DIAGNOSIS — G4701 Insomnia due to medical condition: Secondary | ICD-10-CM | POA: Diagnosis not present

## 2023-10-26 DIAGNOSIS — I10 Essential (primary) hypertension: Secondary | ICD-10-CM | POA: Diagnosis not present

## 2023-10-26 DIAGNOSIS — Z9181 History of falling: Secondary | ICD-10-CM | POA: Diagnosis not present

## 2023-10-26 DIAGNOSIS — H1010 Acute atopic conjunctivitis, unspecified eye: Secondary | ICD-10-CM | POA: Diagnosis not present

## 2023-10-26 DIAGNOSIS — K581 Irritable bowel syndrome with constipation: Secondary | ICD-10-CM | POA: Diagnosis not present

## 2023-10-26 DIAGNOSIS — E538 Deficiency of other specified B group vitamins: Secondary | ICD-10-CM | POA: Diagnosis not present

## 2023-10-26 MED ORDER — POLYETHYLENE GLYCOL 3350 17 GM/SCOOP PO POWD: 17.0000 g | Freq: Every day | ORAL | 3 refills | Status: AC | PRN

## 2023-10-26 MED ORDER — BISOPROLOL-HYDROCHLOROTHIAZIDE 5-6.25 MG PO TABS: 1.0000 | ORAL_TABLET | Freq: Every day | ORAL | 6 refills | Status: DC

## 2023-10-26 MED ORDER — MAGNESIUM OXIDE 400 MG PO TABS: 400.0000 mg | ORAL_TABLET | Freq: Every day | ORAL | 3 refills | Status: AC

## 2023-10-26 MED ORDER — CYANOCOBALAMIN 1000 MCG/ML IJ SOLN
1000.0000 ug | Freq: Once | INTRAMUSCULAR | Status: AC
Start: 2023-10-26 — End: 2023-10-26
  Administered 2023-10-26: 1000 ug via INTRAMUSCULAR

## 2023-10-26 MED ORDER — TRAZODONE HCL 50 MG PO TABS
50.0000 mg | ORAL_TABLET | Freq: Every day | ORAL | 6 refills | Status: DC
Start: 2023-10-26 — End: 2024-02-21

## 2023-10-26 MED ORDER — FLUTICASONE PROPIONATE 50 MCG/ACT NA SUSP
1.0000 | Freq: Every day | NASAL | 3 refills | Status: DC
Start: 2023-10-26 — End: 2024-04-24

## 2023-10-26 MED ORDER — CETIRIZINE HCL 10 MG PO TABS
10.0000 mg | ORAL_TABLET | Freq: Every day | ORAL | 5 refills | Status: DC
Start: 2023-10-26 — End: 2024-02-21

## 2023-10-26 MED ORDER — LUBIPROSTONE 8 MCG PO CAPS: 8.0000 ug | ORAL_CAPSULE | Freq: Every day | ORAL | 5 refills | Status: DC

## 2023-10-26 NOTE — Progress Notes (Signed)
 [facility] [address] [place], [place] [address]  Internal MEDICINE  Office Visit Note  Patient Name: Kristen Pennington  [id number]  [id number]  Date of Service: 10/26/2023  Chief Complaint  Patient presents with   Depression   Gastroesophageal Reflux   Hypertension   Hyperlipidemia   Follow-up    HPI Wandalee presents for a follow-up visit for hypertension, low B12, allergic rhinoconjunctivitis, and constipation Hypertension -- elevated initially but she walked to the office from her apt. Rechecked.  B12 deficiency -- due for B12 injection today.  Allergic rhinoconjunctivitis -- has been taking levocetirizine but states that it is not helping. still having sinus drainage.  Constipation -- wants to try amitiza  again   Current Medication: Outpatient Encounter Medications as of 10/26/2023  Medication Sig   acetaminophen  (TYLENOL ) 500 MG tablet Take 500 mg by mouth every 6 (six) hours as needed.   albuterol  (VENTOLIN  HFA) 108 (90 Base) MCG/ACT inhaler Inhale 2 puffs into the lungs every 6 (six) hours as needed for wheezing or shortness of breath.   Cholecalciferol  (VITAMIN D3) 5000 units TABS Take 5,000 Units by mouth daily.   escitalopram  (LEXAPRO ) 10 MG tablet TAKE 1 TABLET BY MOUTH DAILY   estradiol  (ESTRACE ) 0.1 MG/GM vaginal cream USE ONE APPLICATORFUL AS DIRECTED ONCE WEEKLY AS NEEDED   furosemide  (LASIX ) 40 MG tablet Take 1 tablet (40 mg total) by mouth daily.   Menthol-Methyl Salicylate  (SALONPAS  PAIN RELIEF  PATCH) PTCH Apply 1 patch topically daily as needed (Pain).   mupirocin  ointment (BACTROBAN ) 2 % APPLY 1 APPLICATION TOPICALLY EVERY DAY TO ABRASIONS SCATTERED ON SKIN UNTILHEALED   promethazine  (PHENERGAN ) 12.5 MG tablet Take 1 tablet (12.5 mg total) by mouth every 6 (six) hours as needed for nausea or vomiting.   rOPINIRole  (REQUIP ) 1 MG tablet TAKE 1 TABLET BY MOUTH EVERY MORNING AND2 TABLETS AT BEDTIME AS DIRECTED.   traMADol  (ULTRAM ) 50 MG tablet TAKE  ONE TABLET BY MOUTH FOUR TIMES DAILY   triamcinolone  cream (KENALOG ) 0.1 % Apply 1 Application topically 2 (two) times daily. To rash area until resolved.   [DISCONTINUED] bisoprolol -hydrochlorothiazide  (ZIAC ) 5-6.25 MG tablet Take 1 tablet by mouth daily.   [DISCONTINUED] fluticasone  (FLONASE ) 50 MCG/ACT nasal spray Place 1 spray into both nostrils daily. 1 SPRAY IN EACH NOSTRIL ONCE DAILY AS NEEDED   [DISCONTINUED] levocetirizine (XYZAL ) 5 MG tablet Take one tab po every day for runny nose   [DISCONTINUED] magnesium  oxide (MAG-OX) 400 MG tablet Take 1 tablet (400 mg total) by mouth at bedtime. At bedtime   [DISCONTINUED] polyethylene glycol powder (GLYCOLAX /MIRALAX ) 17 GM/SCOOP powder Take 17 g by mouth daily as needed for mild constipation or moderate constipation.   [DISCONTINUED] traZODone  (DESYREL ) 50 MG tablet Take 1 tablet (50 mg total) by mouth at bedtime.   bisoprolol -hydrochlorothiazide  (ZIAC ) 5-6.25 MG tablet Take 1 tablet by mouth daily.   fluticasone  (FLONASE ) 50 MCG/ACT nasal spray Place 1 spray into both nostrils daily. 1 SPRAY IN EACH NOSTRIL ONCE DAILY AS NEEDED   lubiprostone  (AMITIZA ) 8 MCG capsule Take 1 capsule (8 mcg total) by mouth at bedtime. TAKE 1 CAPSULE BY MOUTH ONCE DAILY FOR CONSTIPATION   magnesium  oxide (MAG-OX) 400 MG tablet Take 1 tablet (400 mg total) by mouth at bedtime. At bedtime   polyethylene glycol powder (GLYCOLAX /MIRALAX ) 17 GM/SCOOP powder Take 17 g by mouth daily as needed for mild constipation or moderate constipation.   traZODone  (DESYREL ) 50 MG tablet Take 1 tablet (50 mg total) by mouth at  bedtime.   Facility-Administered Encounter Medications as of 10/26/2023  Medication   cyanocobalamin  (VITAMIN B12) injection 1,000 mcg    Surgical History: Past Surgical History:  Procedure Laterality Date   COLONOSCOPY  05-03-15   COLONOSCOPY WITH PROPOFOL  N/A 05/03/2015   Procedure: COLONOSCOPY WITH PROPOFOL ;  Surgeon: Marnee Sink, MD;  Location: [facility];  Service: Endoscopy;  Laterality: N/A;  CPAP   DILATION AND CURETTAGE OF UTERUS  1970   ENDOMETRIAL BIOPSY     EYE SURGERY Right 2017   POLYPECTOMY  05/03/2015   Procedure: POLYPECTOMY;  Surgeon: Marnee Sink, MD;  Location: [facility];  Service: Endoscopy;;   ROTATOR CUFF REPAIR  2007   SPINE SURGERY      Medical History: Past Medical History:  Diagnosis Date   Arthritis    Atrophic vaginitis 03/06/2013   Last Assessment & Plan:  She will plan to continue estradiol  vaginal cream.  Prescription was rewritten stipulating use of the generic product.    Atypical migraine 03/06/2013   Last Assessment & Plan:  Since she rarely takes sumatriptan, we will discontinue it. Continue sparing use of OTC migraine products.    Avitaminosis D 03/06/2013   Last Assessment & Plan:  Recheck vitamin D  level. Plan to continue vitamin D  supplementation.    Ceratitis 08/01/2013   Last Assessment & Plan:  Because of the expense, she will discontinue Restasis. She will use over-the-counter moisturizing eyedrops and liquid gel.    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    Cystocele, midline 03/06/2013   Demoralization and apathy 03/06/2013   Last Assessment & Plan:  Since she has been taking bupropion only once a day, I will write the prescription with the correct instructions and correct quantity. She has done well on this for years and she is encouraged to take it regularly    Depression    Environmental allergies    Epistaxis    Essential (primary) hypertension 05/17/2013   Last Assessment & Plan:  Her blood pressure is well-controlled. We will plan to continue hydrochlorothiazide  and benazepril.  Both are generic and should be affordable on her  health plan.    GERD (gastroesophageal reflux disease)    Hemorrhoid    Inflammation of sacroiliac joint (HCC) 03/06/2013   Last Assessment & Plan:  She is doing well on her present regimen of fentanyl  patch supplemented with sparing use of tramadol /acetaminophen . We will continue that regimen.    Sinusitis    Sleep apnea    CPAP    Family History: Family History  Problem Relation Age of Onset   Arthritis Mother    Alzheimer's disease Father    Heart disease Sister    Cancer Brother    Ovarian cancer Neg Hx    Breast cancer Neg Hx    Colon cancer Neg Hx    Diabetes Neg Hx     Social History   Socioeconomic History   Marital status: Widowed    Spouse name: Not on file   Number of children: 2   Years of education: 39   Highest education level: Some college, no degree  Occupational History   Not on file  Tobacco Use   Smoking status: Never    Passive  exposure: Never   Smokeless tobacco: Never  Vaping Use   Vaping status: Never Used  Substance and Sexual Activity   Alcohol  use: No    Alcohol /week: 0.0 standard drinks of alcohol    Drug use: No   Sexual activity: Not Currently  Other Topics Concern   Not on file  Social History Narrative   Not on file   Social Drivers of Health   Financial Resource Strain: Low Risk  (04/19/2023)   Overall Financial Resource Strain (CARDIA)    Difficulty of Paying Living Expenses: Not hard at all  Food Insecurity: No Food Insecurity (04/19/2023)   Hunger Vital Sign    Worried About Running Out of Food in the Last Year: Never true    Ran Out of Food in the Last Year: Never true  Transportation Needs: No Transportation Needs (04/19/2023)   PRAPARE - Administrator, Civil Service (Medical): No    Lack of Transportation (Non-Medical): No  Physical Activity: Inactive (04/19/2023)   Exercise Vital Sign    Days of Exercise per Week: 0 days    Minutes of Exercise per Session: 0 min  Stress: Stress Concern Present (04/19/2023)    [employer] of Occupational Health - Occupational Stress Questionnaire    Feeling of Stress : To some extent  Social Connections: Moderately Isolated (04/19/2023)   Social Connection and Isolation Panel [NHANES]    Frequency of Communication with Friends and Family: Twice a week    Frequency of Social Gatherings with Friends and Family: Once a week    Attends Religious Services: 1 to 4 times per year    Active Member of [employer] or Organizations: No    Attends Banker Meetings: Never    Marital Status: Widowed  Intimate Partner Violence: Not At Risk (04/19/2023)   Humiliation, Afraid, Rape, and Kick questionnaire    Fear of Current or Ex-Partner: No    Emotionally Abused: No    Physically Abused: No    Sexually Abused: No      Review of Systems  Constitutional:  Negative for chills, fatigue, fever and unexpected weight change.  HENT:  Positive for rhinorrhea. Negative for congestion, mouth sores, postnasal drip, sinus pain, sneezing and sore throat.   Eyes:  Negative for redness.  Respiratory: Negative.  Negative for cough, chest tightness, shortness of breath and wheezing.   Cardiovascular: Negative.  Negative for chest pain and palpitations.  Gastrointestinal: Negative.  Negative for abdominal pain, constipation, diarrhea, nausea and vomiting.  Genitourinary:  Negative for dysuria, flank pain and frequency.  Musculoskeletal:  Positive for arthralgias and back pain. Negative for joint swelling and neck pain.  Skin:  Negative for rash.  Neurological: Negative.  Negative for tremors and numbness.  Hematological:  Negative for adenopathy. Does not bruise/bleed easily.  Psychiatric/Behavioral: Negative.  Negative for self-injury, sleep disturbance and suicidal ideas. Behavioral problem: Depression.       Feeling lonely    Vital Signs: BP 125/60 Comment: 153/71  Pulse 61   Temp 98.3 F (36.8 C)   Resp 16   Ht 5\' 4"  (1.626 m)   Wt 131 lb 6.4 oz (59.6 kg)   SpO2  93%   BMI 22.55 kg/m    Physical Exam Vitals reviewed.  Constitutional:      General: She is not in acute distress.    Appearance: Normal appearance. She is normal weight. She is not ill-appearing.  HENT:     Head: Normocephalic and atraumatic.  Nose: Congestion and rhinorrhea present.     Mouth/Throat:     Mouth: Mucous membranes are moist.     Pharynx: Oropharynx is clear.  Eyes:     Pupils: Pupils are equal, round, and reactive to light.  Cardiovascular:     Rate and Rhythm: Normal rate and regular rhythm.  Pulmonary:     Effort: Pulmonary effort is normal. No respiratory distress.  Neurological:     Mental Status: She is alert and oriented to person, place, and time.  Psychiatric:        Mood and Affect: Mood normal.        Behavior: Behavior normal.       Assessment/Plan: 1. Essential (primary) hypertension (Primary) Stable, continue bisoprolol -hydrochlorothiazide  and furosemide  as prescribed.  - bisoprolol -hydrochlorothiazide  (ZIAC ) 5-6.25 MG tablet; Take 1 tablet by mouth daily.  Dispense: 30 tablet; Refill: 6  2. Allergic rhinoconjunctivitis Continue flonase  and cetirizine  as prescribed.  - fluticasone  (FLONASE ) 50 MCG/ACT nasal spray; Place 1 spray into both nostrils daily. 1 SPRAY IN EACH NOSTRIL ONCE DAILY AS NEEDED  Dispense: 16 g; Refill: 3 - cetirizine  (ZYRTEC ) 10 MG tablet; Take 1 tablet (10 mg total) by mouth daily.  Dispense: 30 tablet; Refill: 5  3. Irritable bowel syndrome with constipation Restart amitiza  for constipation. Continue magnesium  oxide as prescribed and may take miralax  as needed.  - magnesium  oxide (MAG-OX) 400 MG tablet; Take 1 tablet (400 mg total) by mouth at bedtime. At bedtime  Dispense: 90 tablet; Refill: 3 - polyethylene glycol powder (GLYCOLAX /MIRALAX ) 17 GM/SCOOP powder; Take 17 g by mouth daily as needed for mild constipation or moderate constipation.  Dispense: 238 g; Refill: 3 - lubiprostone  (AMITIZA ) 8 MCG capsule; Take 1  capsule (8 mcg total) by mouth at bedtime. TAKE 1 CAPSULE BY MOUTH ONCE DAILY FOR CONSTIPATION  Dispense: 30 capsule; Refill: 5  4. Insomnia due to medical condition Continue trazodone  and magnesium  oxide as prescribed.  - traZODone  (DESYREL ) 50 MG tablet; Take 1 tablet (50 mg total) by mouth at bedtime.  Dispense: 30 tablet; Refill: 6 - magnesium  oxide (MAG-OX) 400 MG tablet; Take 1 tablet (400 mg total) by mouth at bedtime. At bedtime  Dispense: 90 tablet; Refill: 3  5. B12 deficiency B12 injection administered in office today  - cyanocobalamin  (VITAMIN B12) injection 1,000 mcg  6. At moderate risk for fall Due to generalized weakness and progressive vision impairment due to macular degeneration.      General Counseling: shamanda len understanding of the findings of todays visit and agrees with plan of treatment. I have discussed any further diagnostic evaluation that may be needed or ordered today. We also reviewed her medications today. she has been encouraged to call the office with any questions or concerns that should arise related to todays visit.    No orders of the defined types were placed in this encounter.   Meds ordered this encounter  Medications   cyanocobalamin  (VITAMIN B12) injection 1,000 mcg   bisoprolol -hydrochlorothiazide  (ZIAC ) 5-6.25 MG tablet    Sig: Take 1 tablet by mouth daily.    Dispense:  30 tablet    Refill:  6    Please make sure medication is in her pill pack   traZODone  (DESYREL ) 50 MG tablet    Sig: Take 1 tablet (50 mg total) by mouth at bedtime.    Dispense:  30 tablet    Refill:  6    FOR FUTURE REFILLS **BUBBLE PACK PT**   magnesium  oxide (MAG-OX)  400 MG tablet    Sig: Take 1 tablet (400 mg total) by mouth at bedtime. At bedtime    Dispense:  90 tablet    Refill:  3    Please add magnesium  oxide back on at bedtime   fluticasone  (FLONASE ) 50 MCG/ACT nasal spray    Sig: Place 1 spray into both nostrils daily. 1 SPRAY IN EACH NOSTRIL  ONCE DAILY AS NEEDED    Dispense:  16 g    Refill:  3    FOR NEXT FILL   polyethylene glycol powder (GLYCOLAX /MIRALAX ) 17 GM/SCOOP powder    Sig: Take 17 g by mouth daily as needed for mild constipation or moderate constipation.    Dispense:  238 g    Refill:  3    Please fun through insurance if they cover this medication   lubiprostone  (AMITIZA ) 8 MCG capsule    Sig: Take 1 capsule (8 mcg total) by mouth at bedtime. TAKE 1 CAPSULE BY MOUTH ONCE DAILY FOR CONSTIPATION    Dispense:  30 capsule    Refill:  5    FOR FUTURE REFILLS **BUBBLE PACK PT**    Return in about 1 month (around 11/25/2023) for F/U, Jamael Hoffmann PCP, eval new med.   Total time spent:30 Minutes Time spent includes review of chart, medications, test results, and follow up plan with the patient.   [place] Controlled Substance Database was reviewed by me.  This patient was seen by Laurence Pons, FNP-C in collaboration with Dr. Verneta Gone as a part of collaborative care agreement.   Muskaan Smet R. Bobbi Burow, MSN, FNP-C Internal medicine

## 2023-10-31 ENCOUNTER — Encounter: Payer: Self-pay | Admitting: Nurse Practitioner

## 2023-11-22 DIAGNOSIS — H26492 Other secondary cataract, left eye: Secondary | ICD-10-CM | POA: Diagnosis not present

## 2023-11-22 DIAGNOSIS — Z961 Presence of intraocular lens: Secondary | ICD-10-CM | POA: Diagnosis not present

## 2023-11-22 DIAGNOSIS — H353131 Nonexudative age-related macular degeneration, bilateral, early dry stage: Secondary | ICD-10-CM | POA: Diagnosis not present

## 2023-11-26 ENCOUNTER — Ambulatory Visit (INDEPENDENT_AMBULATORY_CARE_PROVIDER_SITE_OTHER): Admitting: Nurse Practitioner

## 2023-11-26 ENCOUNTER — Emergency Department

## 2023-11-26 ENCOUNTER — Encounter: Payer: Self-pay | Admitting: Nurse Practitioner

## 2023-11-26 ENCOUNTER — Other Ambulatory Visit: Payer: Self-pay

## 2023-11-26 ENCOUNTER — Emergency Department
Admission: EM | Admit: 2023-11-26 | Discharge: 2023-11-26 | Disposition: A | Discharge status: Home / Self Care | Attending: Emergency Medicine | Admitting: Emergency Medicine

## 2023-11-26 ENCOUNTER — Telehealth: Payer: Self-pay

## 2023-11-26 VITALS — BP 122/58 | HR 72 | Temp 97.3°F | Resp 16 | Ht 64.0 in | Wt 130.0 lb

## 2023-11-26 DIAGNOSIS — G8929 Other chronic pain: Secondary | ICD-10-CM | POA: Diagnosis not present

## 2023-11-26 DIAGNOSIS — R410 Disorientation, unspecified: Secondary | ICD-10-CM | POA: Diagnosis not present

## 2023-11-26 DIAGNOSIS — Z9181 History of falling: Secondary | ICD-10-CM

## 2023-11-26 DIAGNOSIS — R5381 Other malaise: Secondary | ICD-10-CM

## 2023-11-26 DIAGNOSIS — I10 Essential (primary) hypertension: Secondary | ICD-10-CM | POA: Diagnosis not present

## 2023-11-26 DIAGNOSIS — N1831 Chronic kidney disease, stage 3a: Secondary | ICD-10-CM

## 2023-11-26 DIAGNOSIS — F411 Generalized anxiety disorder: Secondary | ICD-10-CM

## 2023-11-26 DIAGNOSIS — S0003XA Contusion of scalp, initial encounter: Secondary | ICD-10-CM | POA: Diagnosis not present

## 2023-11-26 DIAGNOSIS — S0990XA Unspecified injury of head, initial encounter: Secondary | ICD-10-CM | POA: Diagnosis not present

## 2023-11-26 DIAGNOSIS — W19XXXA Unspecified fall, initial encounter: Secondary | ICD-10-CM

## 2023-11-26 DIAGNOSIS — R531 Weakness: Secondary | ICD-10-CM | POA: Diagnosis not present

## 2023-11-26 DIAGNOSIS — W1839XA Other fall on same level, initial encounter: Secondary | ICD-10-CM | POA: Diagnosis not present

## 2023-11-26 DIAGNOSIS — Z789 Other specified health status: Secondary | ICD-10-CM | POA: Diagnosis not present

## 2023-11-26 DIAGNOSIS — G4701 Insomnia due to medical condition: Secondary | ICD-10-CM

## 2023-11-26 DIAGNOSIS — Y93K1 Activity, walking an animal: Secondary | ICD-10-CM | POA: Diagnosis not present

## 2023-11-26 NOTE — Telephone Encounter (Signed)
 lmom to pt daughter to call us  back

## 2023-11-26 NOTE — Discharge Instructions (Signed)
 CT imaging of your head and neck showed no acute traumatic injuries.  Please follow-up with your primary care provider as needed.

## 2023-11-26 NOTE — ED Triage Notes (Signed)
 Pt comes with c/o fall week ago. Pt states she hit her head. Pt is not on thinners. Pt states she feels more confused than normal.

## 2023-11-26 NOTE — ED Provider Notes (Signed)
 [facility] Provider Note    Event Date/Time   First MD Initiated Contact with Patient 11/26/23 1306     (approximate)   History   Fall   HPI Kristen Pennington is a 89 y.o. female presenting today for fall.  Patient states she had a fall yesterday morning while walking a dog and her feet got twisted.  States hitting the back of her head and neck.  No loss of consciousness.  Not on blood thinners.  States she was unsure if she had some forgetfulness but also states she has this prior.  Does not feel confused at this time.  Denies any injury elsewhere.     Physical Exam   Triage Vital Signs: ED Triage Vitals  Encounter Vitals Group     BP 11/26/23 1128 (!) 158/72     Systolic BP Percentile --      Diastolic BP Percentile --      Pulse Rate 11/26/23 1128 (!) 55     Resp 11/26/23 1128 18     Temp 11/26/23 1128 98.7 F (37.1 C)     Temp src --      SpO2 11/26/23 1128 97 %     Weight 11/26/23 1126 130 lb (59 kg)     Height 11/26/23 1126 5\' 4"  (1.626 m)     Head Circumference --      Peak Flow --      Pain Score --      Pain Loc --      Pain Education --      Exclude from Growth Chart --     Most recent vital signs: Vitals:   11/26/23 1128  BP: (!) 158/72  Pulse: (!) 55  Resp: 18  Temp: 98.7 F (37.1 C)  SpO2: 97%   I have reviewed the vital signs. General:  Awake, alert, no acute distress. Head:  Normocephalic, small hematoma to superior portion of scalp. EENT:  PERRL, EOMI, Oral mucosa pink and moist, Neck is supple. Cardiovascular: Regular rate, 2+ distal pulses. Respiratory:  Normal respiratory effort, symmetrical expansion, no distress.   Extremities:  Moving all four extremities through full ROM without pain.   Neuro:  Alert and oriented.  Interacting appropriately.   Skin:  Warm, dry, no rash.   Psych: Appropriate affect.    ED Results / Procedures / Treatments   Labs (all labs ordered are listed, but only abnormal results  are displayed) Labs Reviewed - No data to display   EKG    RADIOLOGY Independently interpreted CT head and C-spine with no acute pathology   PROCEDURES:  Critical Care performed: No  Procedures   MEDICATIONS ORDERED IN ED: Medications - No data to display   IMPRESSION / MDM / ASSESSMENT AND PLAN / ED COURSE  I reviewed the triage vital signs and the nursing notes.                              Differential diagnosis includes, but is not limited to, ICH, cervical spine injury, soft tissue edema, concussion  Patient's presentation is most consistent with acute complicated illness / injury requiring diagnostic workup.  Patient is a 89 year old female presenting today for fall with head injury.  Injury happened over 24 hours ago.  CT imaging of head and neck showed no acute traumatic injuries.  Small hematoma to scalp.  She does not have any acute confusion at this time  to be blatantly obvious of concussion symptoms.  She thinks this may be her chronic forgetfulness with no other changes.  Patient otherwise ambulatory and safe for discharge at this time.  Told to follow-up with PCP and given strict return precautions.     FINAL CLINICAL IMPRESSION(S) / ED DIAGNOSES   Final diagnoses:  Fall, initial encounter  Contusion of scalp, initial encounter     Rx / DC Orders   ED Discharge Orders     None        Note:  This document was prepared using Dragon voice recognition software and may include unintentional dictation errors.   Kandee Orion, MD 11/26/23 1352

## 2023-11-26 NOTE — Progress Notes (Signed)
 [facility] [address] [place], [place] [address]  Internal MEDICINE  Office Visit Note  Patient Name: Kristen Pennington  [id number]  [id number]  Date of Service: 11/26/2023  Chief Complaint  Patient presents with   Depression   Gastroesophageal Reflux   Hyperlipidemia   Hypertension   Follow-up    Kristen Pennington last week, hurt her head    HPI Kristen Pennington presents for a follow-up visit for multiple falls, weakness, declining functional status, head trauma.  Kristen Pennington last week and possibly hit her head. Did not go to the ER and get evaluated at the time.  Reports that her vision has worsened since she fell last week and she feels more confused and disoriented. She states that she does not recognize any of her pills any more. She does not remember what they are supposed to look like and cannot see them well due to the macular degeneration.  She is still walking across the street to come to her appts at the clinic on her own although she has a history of multiple falls.  She reports that she can no longer see well enough to cook and prepare meals for herself. She has switched to eating frozen TV dinners.     Current Medication: Outpatient Encounter Medications as of 11/26/2023  Medication Sig   acetaminophen  (TYLENOL ) 500 MG tablet Take 500 mg by mouth every 6 (six) hours as needed.   albuterol  (VENTOLIN  HFA) 108 (90 Base) MCG/ACT inhaler Inhale 2 puffs into the lungs every 6 (six) hours as needed for wheezing or shortness of breath.   bisoprolol -hydrochlorothiazide  (ZIAC ) 5-6.25 MG tablet Take 1 tablet by mouth daily.   cetirizine  (ZYRTEC ) 10 MG tablet Take 1 tablet (10 mg total) by mouth daily.   Cholecalciferol  (VITAMIN D3) 5000 units TABS Take 5,000 Units by mouth daily.   escitalopram  (LEXAPRO ) 10 MG tablet TAKE 1 TABLET BY MOUTH DAILY   estradiol  (ESTRACE ) 0.1 MG/GM vaginal cream USE ONE APPLICATORFUL AS DIRECTED ONCE WEEKLY AS NEEDED   fluticasone  (FLONASE ) 50 MCG/ACT nasal spray  Place 1 spray into both nostrils daily. 1 SPRAY IN EACH NOSTRIL ONCE DAILY AS NEEDED   furosemide  (LASIX ) 40 MG tablet Take 1 tablet (40 mg total) by mouth daily.   lubiprostone  (AMITIZA ) 8 MCG capsule Take 1 capsule (8 mcg total) by mouth at bedtime. TAKE 1 CAPSULE BY MOUTH ONCE DAILY FOR CONSTIPATION   magnesium  oxide (MAG-OX) 400 MG tablet Take 1 tablet (400 mg total) by mouth at bedtime. At bedtime   Menthol-Methyl Salicylate  (SALONPAS  PAIN RELIEF  PATCH) PTCH Apply 1 patch topically daily as needed (Pain).   mupirocin  ointment (BACTROBAN ) 2 % APPLY 1 APPLICATION TOPICALLY EVERY DAY TO ABRASIONS SCATTERED ON SKIN UNTILHEALED   polyethylene glycol powder (GLYCOLAX /MIRALAX ) 17 GM/SCOOP powder Take 17 g by mouth daily as needed for mild constipation or moderate constipation.   promethazine  (PHENERGAN ) 12.5 MG tablet Take 1 tablet (12.5 mg total) by mouth every 6 (six) hours as needed for nausea or vomiting.   rOPINIRole  (REQUIP ) 1 MG tablet TAKE 1 TABLET BY MOUTH EVERY MORNING AND2 TABLETS AT BEDTIME AS DIRECTED.   traMADol  (ULTRAM ) 50 MG tablet TAKE ONE TABLET BY MOUTH FOUR TIMES DAILY   traZODone  (DESYREL ) 50 MG tablet Take 1 tablet (50 mg total) by mouth at bedtime.   triamcinolone  cream (KENALOG ) 0.1 % Apply 1 Application topically 2 (two) times daily. To rash area until resolved.   No facility-administered encounter medications on file as of 11/26/2023.  Surgical History: Past Surgical History:  Procedure Laterality Date   COLONOSCOPY  05-03-15   COLONOSCOPY WITH PROPOFOL  N/A 05/03/2015   Procedure: COLONOSCOPY WITH PROPOFOL ;  Surgeon: Marnee Sink, MD;  Location: [facility];  Service: Endoscopy;  Laterality: N/A;  CPAP   DILATION AND CURETTAGE OF UTERUS  1970   ENDOMETRIAL BIOPSY     EYE SURGERY Right 2017   POLYPECTOMY  05/03/2015   Procedure: POLYPECTOMY;  Surgeon: Marnee Sink, MD;  Location: [facility];  Service: Endoscopy;;   ROTATOR CUFF REPAIR  2007   SPINE  SURGERY      Medical History: Past Medical History:  Diagnosis Date   Arthritis    Atrophic vaginitis 03/06/2013   Last Assessment & Plan:  She will plan to continue estradiol  vaginal cream.  Prescription was rewritten stipulating use of the generic product.    Atypical migraine 03/06/2013   Last Assessment & Plan:  Since she rarely takes sumatriptan, we will discontinue it. Continue sparing use of OTC migraine products.    Avitaminosis D 03/06/2013   Last Assessment & Plan:  Recheck vitamin D  level. Plan to continue vitamin D  supplementation.    Ceratitis 08/01/2013   Last Assessment & Plan:  Because of the expense, she will discontinue Restasis. She will use over-the-counter moisturizing eyedrops and liquid gel.    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    Cystocele, midline 03/06/2013   Demoralization and apathy 03/06/2013   Last Assessment & Plan:  Since she has been taking bupropion only once a day, I will write the prescription with the correct instructions and correct quantity. She has done well on this for years and she is encouraged to take it regularly    Depression    Environmental allergies    Epistaxis    Essential (primary) hypertension 05/17/2013   Last Assessment & Plan:  Her blood pressure is well-controlled. We will plan to continue hydrochlorothiazide  and benazepril.  Both are generic and should be affordable on her health plan.    GERD (gastroesophageal reflux disease)    Hemorrhoid    Inflammation of sacroiliac joint (HCC) 03/06/2013   Last Assessment & Plan:  She is doing well on her present regimen of fentanyl  patch supplemented with sparing use of tramadol /acetaminophen . We will continue that regimen.    Sinusitis    Sleep apnea     CPAP    Family History: Family History  Problem Relation Age of Onset   Arthritis Mother    Alzheimer's disease Father    Heart disease Sister    Cancer Brother    Ovarian cancer Neg Hx    Breast cancer Neg Hx    Colon cancer Neg Hx    Diabetes Neg Hx     Social History   Socioeconomic History   Marital status: Widowed    Spouse name: Not on file   Number of children: 2   Years of education: 64   Highest education level: Some college, no degree  Occupational History   Not on file  Tobacco Use   Smoking status: Never    Passive exposure: Never   Smokeless tobacco: Never  Vaping Use   Vaping status: Never Used  Substance and Sexual Activity  Alcohol  use: No    Alcohol /week: 0.0 standard drinks of alcohol    Drug use: No   Sexual activity: Not Currently  Other Topics Concern   Not on file  Social History Narrative   Not on file   Social Drivers of Health   Financial Resource Strain: Low Risk  (04/19/2023)   Overall Financial Resource Strain (CARDIA)    Difficulty of Paying Living Expenses: Not hard at all  Food Insecurity: No Food Insecurity (04/19/2023)   Hunger Vital Sign    Worried About Running Out of Food in the Last Year: Never true    Ran Out of Food in the Last Year: Never true  Transportation Needs: No Transportation Needs (04/19/2023)   PRAPARE - Administrator, Civil Service (Medical): No    Lack of Transportation (Non-Medical): No  Physical Activity: Inactive (04/19/2023)   Exercise Vital Sign    Days of Exercise per Week: 0 days    Minutes of Exercise per Session: 0 min  Stress: Stress Concern Present (04/19/2023)   [employer] of Occupational Health - Occupational Stress Questionnaire    Feeling of Stress : To some extent  Social Connections: Moderately Isolated (04/19/2023)   Social Connection and Isolation Panel [NHANES]    Frequency of Communication with Friends and Family: Twice a week    Frequency of Social Gatherings with  Friends and Family: Once a week    Attends Religious Services: 1 to 4 times per year    Active Member of [employer] or Organizations: No    Attends Banker Meetings: Never    Marital Status: Widowed  Intimate Partner Violence: Not At Risk (04/19/2023)   Humiliation, Afraid, Rape, and Kick questionnaire    Fear of Current or Ex-Partner: No    Emotionally Abused: No    Physically Abused: No    Sexually Abused: No      Review of Systems  Constitutional:  Positive for activity change, appetite change and fatigue.  HENT: Negative.    Respiratory: Negative.  Negative for cough, chest tightness, shortness of breath and wheezing.   Cardiovascular: Negative.  Negative for chest pain and palpitations.  Gastrointestinal: Negative.   Musculoskeletal:  Positive for arthralgias, back pain and gait problem.  Neurological:  Positive for weakness and headaches. Negative for tremors.  Hematological:  Bruises/bleeds easily.  Psychiatric/Behavioral:  Positive for confusion and sleep disturbance. Negative for self-injury. The patient is nervous/anxious.     Vital Signs: BP (!) 122/58   Pulse 72   Temp (!) 97.3 F (36.3 C)   Resp 16   Ht 5\' 4"  (1.626 m)   Wt 130 lb (59 kg)   SpO2 93%   BMI 22.31 kg/m    Physical Exam Vitals reviewed.  Constitutional:      General: She is awake. She is not in acute distress.    Appearance: Normal appearance. She is normal weight. She is not ill-appearing.  HENT:     Head: Normocephalic and atraumatic.      Comments: Mod to large superficial hematoma on the top of the patient's head Cardiovascular:     Rate and Rhythm: Normal rate and regular rhythm.     Heart sounds: Normal heart sounds, S1 normal and S2 normal.  Pulmonary:     Effort: Pulmonary effort is normal. No accessory muscle usage or respiratory distress.  Neurological:     Mental Status: She is alert and oriented to person, place, and time.  Psychiatric:  Mood and Affect: Mood  normal.        Behavior: Behavior normal. Behavior is cooperative.        Assessment/Plan: 1. Traumatic injury of head with hematoma of scalp (Primary) EMS called and patient was transported to [facility] ER from the clinic via ambulance for further evaluation esp with CT head to rule out any bleed or other acute injury. Referred to home health with physical therapy  - Ambulatory referral to Home Health  2. Generalized weakness Referred to home health with physical therapy  - Ambulatory referral to Home Health  3. Declining functional status Referred to home health with physical therapy  - Ambulatory referral to Home Health  4. Essential (primary) hypertension Continue medications as prescribed   5. Chronic primary generalized pain Referred to home health with physical therapy - Ambulatory referral to Home Health  6. Stage 3a chronic kidney disease (HCC) Referred to home health  - Ambulatory referral to Home Health  7. Insomnia due to medical condition Continue trazodone  as prescribed   8. Difficulty assisting with meal preparation Referred to home health and phone number for meals on wheels was given to the patient.  - Ambulatory referral to Home Health  9. History of recent fall Referred to home health  - Ambulatory referral to Home Health  10. Generalized anxiety disorder Referred to home health - Ambulatory referral to Home Health   General Counseling: kaitlain wesselmann understanding of the findings of todays visit and agrees with plan of treatment. I have discussed any further diagnostic evaluation that may be needed or ordered today. We also reviewed her medications today. she has been encouraged to call the office with any questions or concerns that should arise related to todays visit.    Orders Placed This Encounter  Procedures   Ambulatory referral to Home Health    No orders of the defined types were placed in this encounter.   Return in about 1 month  (around 12/27/2023) for F/U, Tarshia Kot PCP.   Total time spent:30 Minutes Time spent includes review of chart, medications, test results, and follow up plan with the patient.   [place] Controlled Substance Database was reviewed by me.  This patient was seen by Laurence Pons, FNP-C in collaboration with Dr. Verneta Gone as a part of collaborative care agreement.   Jalee Saine R. Bobbi Burow, MSN, FNP-C Internal medicine

## 2023-11-26 NOTE — ED Triage Notes (Signed)
 FIRST RN: Pt from her PCP's office, pt had a fall on Tuesday with hematoma to head, not on thinners. Pt feels like she's been more confused than normal since then. Pt A/O x 4.   127CBG 64HR 98T 174/91 96RA

## 2023-11-26 NOTE — ED Notes (Signed)
 Pt d/c home with family friend, discharge summary reviewed, verbalize understanding. Pt daughter called and updated on POC and discharge plan.

## 2023-12-01 ENCOUNTER — Telehealth: Payer: Self-pay

## 2023-12-01 NOTE — Telephone Encounter (Signed)
 Sent message to [facility] waiting for respond

## 2023-12-01 NOTE — Telephone Encounter (Signed)
 [facility] will take referral

## 2023-12-08 ENCOUNTER — Other Ambulatory Visit: Payer: Self-pay | Admitting: Nurse Practitioner

## 2023-12-08 DIAGNOSIS — G8929 Other chronic pain: Secondary | ICD-10-CM

## 2023-12-13 ENCOUNTER — Telehealth: Payer: Self-pay

## 2023-12-13 NOTE — Telephone Encounter (Signed)
 Esther OT from [facility] called to request verbal OK for O: 1 week 1, 2 week 1, 1 week 2. Gave verbal ok, notified AA.

## 2023-12-14 DIAGNOSIS — Z9889 Other specified postprocedural states: Secondary | ICD-10-CM | POA: Diagnosis not present

## 2023-12-14 DIAGNOSIS — Z961 Presence of intraocular lens: Secondary | ICD-10-CM | POA: Diagnosis not present

## 2023-12-14 NOTE — Telephone Encounter (Signed)
 Notified AA.

## 2023-12-17 ENCOUNTER — Telehealth: Payer: Self-pay

## 2023-12-17 NOTE — Telephone Encounter (Signed)
 Tiffany from [facility] called to ask for verbal orders for physical therapy for 1 week 4 and then once every two week for 4 weeks. [phone] option 2

## 2023-12-28 ENCOUNTER — Telehealth: Payer: Self-pay

## 2023-12-28 NOTE — Telephone Encounter (Signed)
 Kristen Pennington from [facility] called asking for verbal order for OT 1 week 3. Verbal order were given. [phone]

## 2023-12-29 ENCOUNTER — Telehealth: Payer: Self-pay

## 2023-12-29 NOTE — Telephone Encounter (Signed)
 Carmelina Chinchilla from [facility] called [phone] Opt 2 gave her verbal order for social worker evaluation

## 2024-02-21 ENCOUNTER — Telehealth: Payer: Self-pay | Admitting: Nurse Practitioner

## 2024-02-21 ENCOUNTER — Emergency Department
Admission: EM | Admit: 2024-02-21 | Discharge: 2024-02-21 | Disposition: A | Discharge status: Home / Self Care | Source: Ambulatory Visit | Attending: Emergency Medicine | Admitting: Emergency Medicine

## 2024-02-21 ENCOUNTER — Encounter: Payer: Self-pay | Admitting: Nurse Practitioner

## 2024-02-21 ENCOUNTER — Emergency Department

## 2024-02-21 ENCOUNTER — Telehealth (INDEPENDENT_AMBULATORY_CARE_PROVIDER_SITE_OTHER): Admitting: Nurse Practitioner

## 2024-02-21 ENCOUNTER — Other Ambulatory Visit: Payer: Self-pay

## 2024-02-21 VITALS — Resp 16 | Ht 64.0 in | Wt 135.0 lb

## 2024-02-21 DIAGNOSIS — S0003XA Contusion of scalp, initial encounter: Secondary | ICD-10-CM | POA: Insufficient documentation

## 2024-02-21 DIAGNOSIS — I10 Essential (primary) hypertension: Secondary | ICD-10-CM | POA: Diagnosis not present

## 2024-02-21 DIAGNOSIS — S0990XA Unspecified injury of head, initial encounter: Secondary | ICD-10-CM

## 2024-02-21 DIAGNOSIS — F0781 Postconcussional syndrome: Secondary | ICD-10-CM | POA: Insufficient documentation

## 2024-02-21 DIAGNOSIS — G44309 Post-traumatic headache, unspecified, not intractable: Secondary | ICD-10-CM | POA: Diagnosis not present

## 2024-02-21 DIAGNOSIS — G8929 Other chronic pain: Secondary | ICD-10-CM

## 2024-02-21 DIAGNOSIS — R410 Disorientation, unspecified: Secondary | ICD-10-CM

## 2024-02-21 DIAGNOSIS — S060X0A Concussion without loss of consciousness, initial encounter: Secondary | ICD-10-CM | POA: Diagnosis not present

## 2024-02-21 DIAGNOSIS — G4701 Insomnia due to medical condition: Secondary | ICD-10-CM

## 2024-02-21 DIAGNOSIS — R42 Dizziness and giddiness: Secondary | ICD-10-CM | POA: Diagnosis not present

## 2024-02-21 DIAGNOSIS — W1809XA Striking against other object with subsequent fall, initial encounter: Secondary | ICD-10-CM | POA: Diagnosis not present

## 2024-02-21 DIAGNOSIS — M4312 Spondylolisthesis, cervical region: Secondary | ICD-10-CM | POA: Diagnosis not present

## 2024-02-21 DIAGNOSIS — R4182 Altered mental status, unspecified: Secondary | ICD-10-CM

## 2024-02-21 DIAGNOSIS — Z79899 Other long term (current) drug therapy: Secondary | ICD-10-CM

## 2024-02-21 DIAGNOSIS — M4802 Spinal stenosis, cervical region: Secondary | ICD-10-CM | POA: Diagnosis not present

## 2024-02-21 DIAGNOSIS — K581 Irritable bowel syndrome with constipation: Secondary | ICD-10-CM

## 2024-02-21 DIAGNOSIS — M5030 Other cervical disc degeneration, unspecified cervical region: Secondary | ICD-10-CM | POA: Diagnosis not present

## 2024-02-21 LAB — COMPREHENSIVE METABOLIC PANEL WITH GFR
ALT: 18 U/L (ref 0–44)
AST: 29 U/L (ref 15–41)
Albumin: 4.4 g/dL (ref 3.5–5.0)
Alkaline Phosphatase: 54 U/L (ref 38–126)
Anion gap: 12 (ref 5–15)
BUN: 22 mg/dL (ref 8–23)
CO2: 28 mmol/L (ref 22–32)
Calcium: 10.2 mg/dL (ref 8.9–10.3)
Chloride: 100 mmol/L (ref 98–111)
Creatinine, Ser: 1.09 mg/dL — ABNORMAL HIGH (ref 0.44–1.00)
GFR, Estimated: 47 mL/min — ABNORMAL LOW (ref >60)
Glucose, Bld: 111 mg/dL — ABNORMAL HIGH (ref 70–99)
Potassium: 3.8 mmol/L (ref 3.5–5.1)
Sodium: 140 mmol/L (ref 135–145)
Total Bilirubin: 1 mg/dL (ref 0.0–1.2)
Total Protein: 7.5 g/dL (ref 6.5–8.1)

## 2024-02-21 LAB — CBC
HCT: 41.7 % (ref 36.0–46.0)
Hemoglobin: 14 g/dL (ref 12.0–15.0)
MCH: 31.7 pg (ref 26.0–34.0)
MCHC: 33.6 g/dL (ref 30.0–36.0)
MCV: 94.3 fL (ref 80.0–100.0)
Platelets: 255 K/uL (ref 150–400)
RBC: 4.42 MIL/uL (ref 3.87–5.11)
RDW: 12.4 % (ref 11.5–15.5)
WBC: 7.7 K/uL (ref 4.0–10.5)
nRBC: 0 % (ref 0.0–0.2)

## 2024-02-21 MED ORDER — LUBIPROSTONE 8 MCG PO CAPS: 8.0000 ug | ORAL_CAPSULE | Freq: Every day | ORAL | 5 refills | Status: AC

## 2024-02-21 MED ORDER — TRAMADOL HCL 50 MG PO TABS: 50.0000 mg | ORAL_TABLET | Freq: Four times a day (QID) | ORAL | 2 refills | Status: DC

## 2024-02-21 MED ORDER — ROPINIROLE HCL 1 MG PO TABS: ORAL_TABLET | ORAL | 5 refills | Status: AC

## 2024-02-21 MED ORDER — TRAZODONE HCL 50 MG PO TABS: 50.0000 mg | ORAL_TABLET | Freq: Every day | ORAL | 6 refills | Status: AC

## 2024-02-21 NOTE — Telephone Encounter (Signed)
 Lvm to return call from over weekend to schedule appointment-Toni

## 2024-02-21 NOTE — Progress Notes (Signed)
 [facility] [address] [place], [place] [address]  Internal MEDICINE  Telephone Visit  Patient Name: Kristen Pennington  [id number]  [id number]  Date of Service: 02/21/2024  I connected with the patient at 0900 by telephone and verified the patients identity using two identifiers.   I discussed the limitations, risks, security and privacy concerns of performing an evaluation and management service by telephone and the availability of in person appointments. I also discussed with the patient that there may be a patient responsible charge related to the service.  The patient expressed understanding and agrees to proceed.    Chief Complaint  Patient presents with   Telephone Screen    Kristen Pennington last Wednesday night dizzy and fell backwards on the cement floor hit her head.     Telephone Assessment    HPI Kristen Pennington presents for a telehealth virtual visit for recent fall, refills and medical problems.  Recent fall reported by patient about 5 days ago. She reports that she was dizzy and fell backwards and hit the back of her head on the cement floor. She reports that there is a large swollen knot on the back of her head. She reports persistent dizziness and frequent episodes of confusion since the fall last week. She reports that this is a change from before she fell.  She lives alone at an assisted living facility. She is very poor vision due to progressively worsening macular degeneration.  She has arthritis of multiple joints and takes tramadol  regularly.  Constipation -- taking amitiza  which is helping Depression -- contributing factors include loneliness and poor vision   Current Medication: Outpatient Encounter Medications as of 02/21/2024  Medication Sig   acetaminophen  (TYLENOL ) 500 MG tablet Take 500 mg by mouth every 6 (six) hours as needed.   albuterol  (VENTOLIN  HFA) 108 (90 Base) MCG/ACT inhaler Inhale 2 puffs into the lungs every 6 (six) hours as needed for wheezing or  shortness of breath.   bisoprolol -hydrochlorothiazide  (ZIAC ) 5-6.25 MG tablet Take 1 tablet by mouth daily.   Cholecalciferol  (VITAMIN D3) 5000 units TABS Take 5,000 Units by mouth daily.   escitalopram  (LEXAPRO ) 10 MG tablet TAKE 1 TABLET BY MOUTH DAILY   estradiol  (ESTRACE ) 0.1 MG/GM vaginal cream USE ONE APPLICATORFUL AS DIRECTED ONCE WEEKLY AS NEEDED   fluticasone  (FLONASE ) 50 MCG/ACT nasal spray Place 1 spray into both nostrils daily. 1 SPRAY IN EACH NOSTRIL ONCE DAILY AS NEEDED   furosemide  (LASIX ) 40 MG tablet Take 1 tablet (40 mg total) by mouth daily.   lubiprostone  (AMITIZA ) 8 MCG capsule Take 1 capsule (8 mcg total) by mouth at bedtime. TAKE 1 CAPSULE BY MOUTH ONCE DAILY FOR CONSTIPATION   magnesium  oxide (MAG-OX) 400 MG tablet Take 1 tablet (400 mg total) by mouth at bedtime. At bedtime   Menthol-Methyl Salicylate  (SALONPAS  PAIN RELIEF  PATCH) PTCH Apply 1 patch topically daily as needed (Pain).   mupirocin  ointment (BACTROBAN ) 2 % APPLY 1 APPLICATION TOPICALLY EVERY DAY TO ABRASIONS SCATTERED ON SKIN UNTILHEALED   polyethylene glycol powder (GLYCOLAX /MIRALAX ) 17 GM/SCOOP powder Take 17 g by mouth daily as needed for mild constipation or moderate constipation.   promethazine  (PHENERGAN ) 12.5 MG tablet Take 1 tablet (12.5 mg total) by mouth every 6 (six) hours as needed for nausea or vomiting.   rOPINIRole  (REQUIP ) 1 MG tablet TAKE 1 TABLET BY MOUTH EVERY MORNING AND2 TABLETS AT BEDTIME AS DIRECTED.   traMADol  (ULTRAM ) 50 MG tablet Take 1 tablet (50 mg total) by mouth 4 (four)  times daily.   traZODone  (DESYREL ) 50 MG tablet Take 1 tablet (50 mg total) by mouth at bedtime.   triamcinolone  cream (KENALOG ) 0.1 % Apply 1 Application topically 2 (two) times daily. To rash area until resolved.   [DISCONTINUED] cetirizine  (ZYRTEC ) 10 MG tablet Take 1 tablet (10 mg total) by mouth daily.   [DISCONTINUED] lubiprostone  (AMITIZA ) 8 MCG capsule Take 1 capsule (8 mcg total) by mouth at bedtime. TAKE 1  CAPSULE BY MOUTH ONCE DAILY FOR CONSTIPATION   [DISCONTINUED] rOPINIRole  (REQUIP ) 1 MG tablet TAKE 1 TABLET BY MOUTH EVERY MORNING AND2 TABLETS AT BEDTIME AS DIRECTED.   [DISCONTINUED] traMADol  (ULTRAM ) 50 MG tablet TAKE ONE TABLET BY MOUTH FOUR TIMES DAILY   [DISCONTINUED] traZODone  (DESYREL ) 50 MG tablet Take 1 tablet (50 mg total) by mouth at bedtime.   No facility-administered encounter medications on file as of 02/21/2024.    Surgical History: Past Surgical History:  Procedure Laterality Date   COLONOSCOPY  05-03-15   COLONOSCOPY WITH PROPOFOL  N/A 05/03/2015   Procedure: COLONOSCOPY WITH PROPOFOL ;  Surgeon: Rogelia Copping, MD;  Location: [facility];  Service: Endoscopy;  Laterality: N/A;  CPAP   DILATION AND CURETTAGE OF UTERUS  1970   ENDOMETRIAL BIOPSY     EYE SURGERY Right 2017   POLYPECTOMY  05/03/2015   Procedure: POLYPECTOMY;  Surgeon: Rogelia Copping, MD;  Location: [facility];  Service: Endoscopy;;   ROTATOR CUFF REPAIR  2007   SPINE SURGERY      Medical History: Past Medical History:  Diagnosis Date   Arthritis    Atrophic vaginitis 03/06/2013   Last Assessment & Plan:  She will plan to continue estradiol  vaginal cream.  Prescription was rewritten stipulating use of the generic product.    Atypical migraine 03/06/2013   Last Assessment & Plan:  Since she rarely takes sumatriptan, we will discontinue it. Continue sparing use of OTC migraine products.    Avitaminosis D 03/06/2013   Last Assessment & Plan:  Recheck vitamin D  level. Plan to continue vitamin D  supplementation.    Ceratitis 08/01/2013   Last Assessment & Plan:  Because of the expense, she will discontinue Restasis. She will use over-the-counter moisturizing eyedrops and liquid gel.    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    Cystocele, midline 03/06/2013   Demoralization and apathy 03/06/2013   Last Assessment & Plan:  Since she has been taking bupropion only once a day, I will write the prescription with the correct instructions and correct quantity. She has done well on this for years and she is encouraged to take it regularly    Depression    Environmental allergies    Epistaxis    Essential (primary) hypertension 05/17/2013   Last Assessment & Plan:  Her blood pressure is well-controlled. We will plan to continue hydrochlorothiazide  and benazepril.  Both are generic and should be affordable on her health plan.    GERD (gastroesophageal reflux disease)    Hemorrhoid    Inflammation of sacroiliac joint (HCC) 03/06/2013   Last Assessment & Plan:  She is doing well on her present regimen of fentanyl  patch supplemented with sparing use of tramadol /acetaminophen . We will continue that regimen.    Sinusitis    Sleep  apnea    CPAP    Family History: Family History  Problem Relation Age of Onset   Arthritis Mother    Alzheimer's disease Father    Heart disease Sister    Cancer Brother    Ovarian cancer Neg Hx    Breast cancer Neg Hx    Colon cancer Neg Hx    Diabetes Neg Hx     Social History   Socioeconomic History   Marital status: Widowed    Spouse name: Not on file   Number of children: 2   Years of education: 69   Highest education level: Some college, no degree  Occupational History   Not on file  Tobacco Use   Smoking status: Never    Passive exposure: Never   Smokeless tobacco: Never  Vaping Use   Vaping status: Never Used  Substance and Sexual Activity   Alcohol  use: No    Alcohol /week: 0.0 standard drinks of alcohol    Drug use: No   Sexual activity: Not Currently  Other Topics Concern   Not on file  Social History Narrative   Not on file   Social Drivers of Health   Financial Resource Strain: Low Risk   (04/19/2023)   Overall Financial Resource Strain (CARDIA)    Difficulty of Paying Living Expenses: Not hard at all  Food Insecurity: No Food Insecurity (04/19/2023)   Hunger Vital Sign    Worried About Running Out of Food in the Last Year: Never true    Ran Out of Food in the Last Year: Never true  Transportation Needs: No Transportation Needs (04/19/2023)   PRAPARE - Administrator, Civil Service (Medical): No    Lack of Transportation (Non-Medical): No  Physical Activity: Inactive (04/19/2023)   Exercise Vital Sign    Days of Exercise per Week: 0 days    Minutes of Exercise per Session: 0 min  Stress: Stress Concern Present (04/19/2023)   [employer] of Occupational Health - Occupational Stress Questionnaire    Feeling of Stress : To some extent  Social Connections: Moderately Isolated (04/19/2023)   Social Connection and Isolation Panel    Frequency of Communication with Friends and Family: Twice a week    Frequency of Social Gatherings with Friends and Family: Once a week    Attends Religious Services: 1 to 4 times per year    Active Member of [employer] or Organizations: No    Attends Banker Meetings: Never    Marital Status: Widowed  Intimate Partner Violence: Not At Risk (04/19/2023)   Humiliation, Afraid, Rape, and Kick questionnaire    Fear of Current or Ex-Partner: No    Emotionally Abused: No    Physically Abused: No    Sexually Abused: No      Review of Systems  Constitutional:  Positive for activity change, appetite change and fatigue.  HENT: Negative.    Respiratory: Negative.  Negative for cough, chest tightness, shortness of breath and wheezing.   Cardiovascular: Negative.  Negative for chest pain and palpitations.  Gastrointestinal: Negative.   Musculoskeletal:  Positive for arthralgias, back pain and gait problem.  Neurological:  Positive for dizziness, weakness, light-headedness and headaches. Negative for tremors.  Hematological:   Bruises/bleeds easily.  Psychiatric/Behavioral:  Positive for confusion and sleep disturbance. Negative for self-injury. The patient is nervous/anxious.     Vital Signs: Resp 16   Ht 5' 4 (1.626 m)   Wt 135 lb (61.2 kg)   BMI 23.17 kg/m  Observation/Objective: She is awake, somewhat disoriented and mild confusion.    Assessment/Plan: 1. Acute confusion (Primary) Patient instructed to go to the ER to be evaluated for head trauma s/p recent fall   2. Traumatic injury of head with altered mental status Patient instructed to go to the ER to be evaluated for head trauma s/p recent fall   3. Chronic primary generalized pain Continue tramadol  as prescribed.  - traMADol  (ULTRAM ) 50 MG tablet; Take 1 tablet (50 mg total) by mouth 4 (four) times daily.  Dispense: 120 tablet; Refill: 2  4. Insomnia due to medical condition Continue trazodone  as prescribed.  - traZODone  (DESYREL ) 50 MG tablet; Take 1 tablet (50 mg total) by mouth at bedtime.  Dispense: 30 tablet; Refill: 6  5. Irritable bowel syndrome with constipation Continue lubiprostone  as prescribed.  - lubiprostone  (AMITIZA ) 8 MCG capsule; Take 1 capsule (8 mcg total) by mouth at bedtime. TAKE 1 CAPSULE BY MOUTH ONCE DAILY FOR CONSTIPATION  Dispense: 30 capsule; Refill: 5  6. Encounter for medication review Medication list reviewed, updated and refills ordered - rOPINIRole  (REQUIP ) 1 MG tablet; TAKE 1 TABLET BY MOUTH EVERY MORNING AND2 TABLETS AT BEDTIME AS DIRECTED.  Dispense: 90 tablet; Refill: 5   General Counseling: Kristen Pennington understanding of the findings of today's phone visit and agrees with plan of treatment. I have discussed any further diagnostic evaluation that may be needed or ordered today. We also reviewed her medications today. she has been encouraged to call the office with any questions or concerns that should arise related to todays visit.  Return for instructed to go to nearest ER.   No orders of the  defined types were placed in this encounter.   Meds ordered this encounter  Medications   traMADol  (ULTRAM ) 50 MG tablet    Sig: Take 1 tablet (50 mg total) by mouth 4 (four) times daily.    Dispense:  120 tablet    Refill:  2    FOR FUTURE REFILLS **BUBBLE PACK PT   traZODone  (DESYREL ) 50 MG tablet    Sig: Take 1 tablet (50 mg total) by mouth at bedtime.    Dispense:  30 tablet    Refill:  6    FOR FUTURE REFILLS **BUBBLE PACK PT**   lubiprostone  (AMITIZA ) 8 MCG capsule    Sig: Take 1 capsule (8 mcg total) by mouth at bedtime. TAKE 1 CAPSULE BY MOUTH ONCE DAILY FOR CONSTIPATION    Dispense:  30 capsule    Refill:  5    FOR FUTURE REFILLS **BUBBLE PACK PT**   rOPINIRole  (REQUIP ) 1 MG tablet    Sig: TAKE 1 TABLET BY MOUTH EVERY MORNING AND2 TABLETS AT BEDTIME AS DIRECTED.    Dispense:  90 tablet    Refill:  5    PT NEEDS REFILLS ASAP **BUBBLE PACK PT**    Time spent:20 Minutes Time spent with patient included reviewing progress notes, labs, imaging studies, and discussing plan for follow up.  [place] Controlled Substance Database was reviewed by me for overdose risk score (ORS) if appropriate.  This patient was seen by Mardy Maxin, FNP-C in collaboration with Dr. Sigrid Bathe as a part of collaborative care agreement.  Azalie Harbeck R. Maxin, MSN, FNP-C Internal medicine

## 2024-02-21 NOTE — ED Notes (Signed)
 See triage note  Presents with dizziness  States she fell and hit her head last Wednesday  Denies and LOC  But states she remains dizzy   States room is spinning

## 2024-02-21 NOTE — ED Triage Notes (Signed)
 Pt comes with dizziness and fall. Pt states she fell backward last week and fell hitting her head. Pt states since she has been having dizziness and just not able to do anything.

## 2024-02-21 NOTE — ED Provider Notes (Signed)
 [facility] Provider Note    Event Date/Time   First MD Initiated Contact with Patient 02/21/24 1533     (approximate)  History   Chief Complaint: Fall and Dizziness  HPI  Kristen Pennington is a 89 y.o. female with a past medical history of gastric reflux, hypertension, depression, presents to the emergency department after head injury 5 days ago.  According to the patient on Wednesday she lost her balance falling backwards hitting her head on cement.  Patient states she had a large bump on the back of her head that has come down however she has continued to have intermittent headaches and dizziness symptoms ever since.  Patient was concerned so she came to the emergency department today for evaluation.  Patient denies any symptoms currently.  Denies any loss of consciousness.  No focal deficits or weakness.  Physical Exam   Triage Vital Signs: ED Triage Vitals [02/21/24 1206]  Encounter Vitals Group     BP 124/73     Girls Systolic BP Percentile      Girls Diastolic BP Percentile      Boys Systolic BP Percentile      Boys Diastolic BP Percentile      Pulse Rate 67     Resp 17     Temp 98 F (36.7 C)     Temp src      SpO2 100 %     Weight 135 lb (61.2 kg)     Height 5' 4 (1.626 m)     Head Circumference      Peak Flow      Pain Score 3     Pain Loc      Pain Education      Exclude from Growth Chart     Most recent vital signs: Vitals:   02/21/24 1206  BP: 124/73  Pulse: 67  Resp: 17  Temp: 98 F (36.7 C)  SpO2: 100%    General: Awake, no distress.  Old appearing bruise to the occipital scalp.  No remaining hematoma. CV:  Good peripheral perfusion.  Regular rate and rhythm  Resp:  Normal effort.  Equal breath sounds bilaterally.  Abd:  No distention.  Soft  ED Results / Procedures / Treatments   EKG  EKG viewed and interpreted by myself shows a sinus rhythm at 62 bpm with a narrow QRS, normal axis, normal intervals, no concerning  ST changes.  RADIOLOGY  I have reviewed and interpreted CT head images.  No obvious bleed seen on my evaluation. Radiology is read the CT scan as negative. Radiology is read the cervical spine CT as negative  MEDICATIONS ORDERED IN ED: Medications - No data to display   IMPRESSION / MDM / ASSESSMENT AND PLAN / ED COURSE  I reviewed the triage vital signs and the nursing notes.  Patient's presentation is most consistent with acute presentation with potential threat to life or bodily function.  Patient presents to the emergency department after a fall approximately 5 days ago hitting her head on cement with an occipital scalp hematoma which has since resolved but the patient continues to have intermittent mild headache and dizziness.  Overall the patient appears well, CT scan of the head and C-spine have resulted negative.  Physical exam is reassuring, vital signs are reassuring.  Patient's lab work shows reassuring CBC as well as a reassuring chemistry.  Suspect likely postconcussive syndrome.  Discussed with the patient use of Tylenol  as needed for headache  for the next few days.  Follow-up with her doctor.  Patient agreeable to plan.  FINAL CLINICAL IMPRESSION(S) / ED DIAGNOSES   Fall Head injury Postconcussive syndrome  Note:  This document was prepared using Dragon voice recognition software and may include unintentional dictation errors.   Dorothyann Drivers, MD 02/21/24 [phone]

## 2024-02-21 NOTE — Discharge Instructions (Addendum)
 As we discussed please take Tylenol  (1000 mg) every 6 hours as needed for headache.  Please drink plenty of fluids and obtain plenty of rest.  Please follow-up with your doctor within the next several days for recheck/reevaluation.  Return to the emergency department for any symptom personally concerning to yourself.

## 2024-03-06 ENCOUNTER — Other Ambulatory Visit: Payer: Self-pay | Admitting: Nurse Practitioner

## 2024-03-06 DIAGNOSIS — H1010 Acute atopic conjunctivitis, unspecified eye: Secondary | ICD-10-CM

## 2024-03-19 ENCOUNTER — Encounter: Payer: Self-pay | Admitting: Nurse Practitioner

## 2024-03-20 MED ORDER — LEVOCETIRIZINE DIHYDROCHLORIDE 5 MG PO TABS: 5.0000 mg | ORAL_TABLET | Freq: Every evening | ORAL | 1 refills | Status: AC

## 2024-03-21 ENCOUNTER — Encounter: Payer: Self-pay | Admitting: Nurse Practitioner

## 2024-03-21 ENCOUNTER — Ambulatory Visit (INDEPENDENT_AMBULATORY_CARE_PROVIDER_SITE_OTHER): Admitting: Nurse Practitioner

## 2024-03-21 VITALS — BP 126/62 | HR 67 | Temp 98.1°F | Resp 16 | Ht 64.0 in | Wt 129.4 lb

## 2024-03-21 DIAGNOSIS — H547 Unspecified visual loss: Secondary | ICD-10-CM

## 2024-03-21 DIAGNOSIS — R2681 Unsteadiness on feet: Secondary | ICD-10-CM | POA: Diagnosis not present

## 2024-03-21 DIAGNOSIS — R5381 Other malaise: Secondary | ICD-10-CM | POA: Diagnosis not present

## 2024-03-21 DIAGNOSIS — H353131 Nonexudative age-related macular degeneration, bilateral, early dry stage: Secondary | ICD-10-CM | POA: Diagnosis not present

## 2024-03-21 DIAGNOSIS — R42 Dizziness and giddiness: Secondary | ICD-10-CM

## 2024-03-21 DIAGNOSIS — S0990XD Unspecified injury of head, subsequent encounter: Secondary | ICD-10-CM

## 2024-03-21 DIAGNOSIS — R296 Repeated falls: Secondary | ICD-10-CM

## 2024-03-21 DIAGNOSIS — I10 Essential (primary) hypertension: Secondary | ICD-10-CM | POA: Diagnosis not present

## 2024-03-21 DIAGNOSIS — Z961 Presence of intraocular lens: Secondary | ICD-10-CM | POA: Diagnosis not present

## 2024-03-21 MED ORDER — MECLIZINE HCL 12.5 MG PO TABS: 12.5000 mg | ORAL_TABLET | Freq: Three times a day (TID) | ORAL | 3 refills | Status: AC | PRN

## 2024-03-21 NOTE — Progress Notes (Signed)
 [facility] [address] [place], [place] [address]  Internal MEDICINE  Office Visit Note  Patient Name: Kristen Pennington  [id number]  [id number]  Date of Service: 03/21/2024  Chief Complaint  Patient presents with   Follow-up    Recent fall f/u    HPI Breshae presents for a follow-up visit for recent falls, dizziness and recent head trauma.   Multiple falls -- one in June and another in August.  With both falls, she experienced some head trauma when she fell. She did have imaging done after both falls and there were no acute abnormalities related to the falls. She had a hematoma on her scalp after the June fall.  She has had persistent dizziness and mild confusion since the last fall in August.     Current Medication: Outpatient Encounter Medications as of 03/21/2024  Medication Sig   meclizine  (ANTIVERT ) 12.5 MG tablet Take 1 tablet (12.5 mg total) by mouth 3 (three) times daily as needed for dizziness.   acetaminophen  (TYLENOL ) 500 MG tablet Take 500 mg by mouth every 6 (six) hours as needed.   albuterol  (VENTOLIN  HFA) 108 (90 Base) MCG/ACT inhaler Inhale 2 puffs into the lungs every 6 (six) hours as needed for wheezing or shortness of breath.   bisoprolol -hydrochlorothiazide  (ZIAC ) 5-6.25 MG tablet Take 1 tablet by mouth daily.   Cholecalciferol  (VITAMIN D3) 5000 units TABS Take 5,000 Units by mouth daily.   escitalopram  (LEXAPRO ) 10 MG tablet TAKE 1 TABLET BY MOUTH DAILY   estradiol  (ESTRACE ) 0.1 MG/GM vaginal cream USE ONE APPLICATORFUL AS DIRECTED ONCE WEEKLY AS NEEDED   fluticasone  (FLONASE ) 50 MCG/ACT nasal spray Place 1 spray into both nostrils daily. 1 SPRAY IN EACH NOSTRIL ONCE DAILY AS NEEDED   furosemide  (LASIX ) 40 MG tablet Take 1 tablet (40 mg total) by mouth daily.   levocetirizine (XYZAL ) 5 MG tablet Take 1 tablet (5 mg total) by mouth every evening.   lubiprostone  (AMITIZA ) 8 MCG capsule Take 1 capsule (8 mcg total) by mouth at bedtime. TAKE 1 CAPSULE BY  MOUTH ONCE DAILY FOR CONSTIPATION   magnesium  oxide (MAG-OX) 400 MG tablet Take 1 tablet (400 mg total) by mouth at bedtime. At bedtime   Menthol-Methyl Salicylate  (SALONPAS  PAIN RELIEF  PATCH) PTCH Apply 1 patch topically daily as needed (Pain).   mupirocin  ointment (BACTROBAN ) 2 % APPLY 1 APPLICATION TOPICALLY EVERY DAY TO ABRASIONS SCATTERED ON SKIN UNTILHEALED   polyethylene glycol powder (GLYCOLAX /MIRALAX ) 17 GM/SCOOP powder Take 17 g by mouth daily as needed for mild constipation or moderate constipation.   promethazine  (PHENERGAN ) 12.5 MG tablet Take 1 tablet (12.5 mg total) by mouth every 6 (six) hours as needed for nausea or vomiting.   rOPINIRole  (REQUIP ) 1 MG tablet TAKE 1 TABLET BY MOUTH EVERY MORNING AND2 TABLETS AT BEDTIME AS DIRECTED.   traMADol  (ULTRAM ) 50 MG tablet Take 1 tablet (50 mg total) by mouth 4 (four) times daily.   traZODone  (DESYREL ) 50 MG tablet Take 1 tablet (50 mg total) by mouth at bedtime.   triamcinolone  cream (KENALOG ) 0.1 % Apply 1 Application topically 2 (two) times daily. To rash area until resolved.   No facility-administered encounter medications on file as of 03/21/2024.    Surgical History: Past Surgical History:  Procedure Laterality Date   COLONOSCOPY  05-03-15   COLONOSCOPY WITH PROPOFOL  N/A 05/03/2015   Procedure: COLONOSCOPY WITH PROPOFOL ;  Surgeon: Rogelia Copping, MD;  Location: [facility];  Service: Endoscopy;  Laterality: N/A;  CPAP  DILATION AND CURETTAGE OF UTERUS  1970   ENDOMETRIAL BIOPSY     EYE SURGERY Right 2017   POLYPECTOMY  05/03/2015   Procedure: POLYPECTOMY;  Surgeon: Rogelia Copping, MD;  Location: [facility];  Service: Endoscopy;;   ROTATOR CUFF REPAIR  2007   SPINE SURGERY      Medical History: Past Medical History:  Diagnosis Date   Arthritis    Atrophic vaginitis 03/06/2013   Last Assessment & Plan:  She will plan to continue estradiol  vaginal cream.  Prescription was rewritten stipulating use of the generic  product.    Atypical migraine 03/06/2013   Last Assessment & Plan:  Since she rarely takes sumatriptan, we will discontinue it. Continue sparing use of OTC migraine products.    Avitaminosis D 03/06/2013   Last Assessment & Plan:  Recheck vitamin D  level. Plan to continue vitamin D  supplementation.    Ceratitis 08/01/2013   Last Assessment & Plan:  Because of the expense, she will discontinue Restasis. She will use over-the-counter moisturizing eyedrops and liquid gel.    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    Cystocele, midline 03/06/2013   Demoralization and apathy 03/06/2013   Last Assessment & Plan:  Since she has been taking bupropion only once a day, I will write the prescription with the correct instructions and correct quantity. She has done well on this for years and she is encouraged to take it regularly    Depression    Environmental allergies    Epistaxis    Essential (primary) hypertension 05/17/2013   Last Assessment & Plan:  Her blood pressure is well-controlled. We will plan to continue hydrochlorothiazide  and benazepril.  Both are generic and should be affordable on her health plan.    GERD (gastroesophageal reflux disease)    Hemorrhoid    Inflammation of sacroiliac joint (HCC) 03/06/2013   Last Assessment & Plan:  She is doing well on her present regimen of fentanyl  patch supplemented with sparing use of tramadol /acetaminophen . We will continue that regimen.    Sinusitis    Sleep apnea    CPAP    Family History: Family History  Problem Relation Age of Onset   Arthritis Mother    Alzheimer's disease Father    Heart disease Sister    Cancer Brother    Ovarian cancer Neg Hx    Breast cancer Neg Hx    Colon cancer Neg Hx     Diabetes Neg Hx     Social History   Socioeconomic History   Marital status: Widowed    Spouse name: Not on file   Number of children: 2   Years of education: 69   Highest education level: Some college, no degree  Occupational History   Not on file  Tobacco Use   Smoking status: Never    Passive exposure: Never   Smokeless tobacco: Never  Vaping Use   Vaping status: Never Used  Substance and Sexual Activity   Alcohol  use: No    Alcohol /week: 0.0 standard drinks of alcohol    Drug use: No   Sexual activity: Not Currently  Other Topics Concern   Not on file  Social History Narrative   Not on file   Social Drivers of  Health   Financial Resource Strain: Low Risk  (04/19/2023)   Overall Financial Resource Strain (CARDIA)    Difficulty of Paying Living Expenses: Not hard at all  Food Insecurity: No Food Insecurity (04/19/2023)   Hunger Vital Sign    Worried About Running Out of Food in the Last Year: Never true    Ran Out of Food in the Last Year: Never true  Transportation Needs: No Transportation Needs (04/19/2023)   PRAPARE - Administrator, Civil Service (Medical): No    Lack of Transportation (Non-Medical): No  Physical Activity: Inactive (04/19/2023)   Exercise Vital Sign    Days of Exercise per Week: 0 days    Minutes of Exercise per Session: 0 min  Stress: Stress Concern Present (04/19/2023)   [employer] of Occupational Health - Occupational Stress Questionnaire    Feeling of Stress : To some extent  Social Connections: Moderately Isolated (04/19/2023)   Social Connection and Isolation Panel    Frequency of Communication with Friends and Family: Twice a week    Frequency of Social Gatherings with Friends and Family: Once a week    Attends Religious Services: 1 to 4 times per year    Active Member of [employer] or Organizations: No    Attends Banker Meetings: Never    Marital Status: Widowed  Intimate Partner Violence: Not At Risk  (04/19/2023)   Humiliation, Afraid, Rape, and Kick questionnaire    Fear of Current or Ex-Partner: No    Emotionally Abused: No    Physically Abused: No    Sexually Abused: No      Review of Systems  Constitutional:  Positive for activity change, appetite change and fatigue.  HENT: Negative.    Respiratory: Negative.  Negative for cough, chest tightness, shortness of breath and wheezing.   Cardiovascular: Negative.  Negative for chest pain and palpitations.  Gastrointestinal: Negative.   Musculoskeletal:  Positive for arthralgias, back pain and gait problem.  Neurological:  Positive for dizziness, weakness, light-headedness and headaches. Negative for tremors.  Hematological:  Bruises/bleeds easily.  Psychiatric/Behavioral:  Positive for confusion and sleep disturbance. Negative for self-injury. The patient is nervous/anxious.     Vital Signs: BP 126/62   Pulse 67   Temp 98.1 F (36.7 C)   Resp 16   Ht 5' 4 (1.626 m)   Wt 129 lb 6.4 oz (58.7 kg)   SpO2 93%   BMI 22.21 kg/m    Physical Exam Vitals reviewed.  Constitutional:      General: She is not in acute distress.    Appearance: Normal appearance. She is normal weight. She is not ill-appearing.  HENT:     Head: Normocephalic and atraumatic.     Nose: Congestion and rhinorrhea present.     Mouth/Throat:     Mouth: Mucous membranes are moist.     Pharynx: Oropharynx is clear.  Eyes:     Pupils: Pupils are equal, round, and reactive to light.  Cardiovascular:     Rate and Rhythm: Normal rate and regular rhythm.  Pulmonary:     Effort: Pulmonary effort is normal. No respiratory distress.  Neurological:     Mental Status: She is alert and oriented to person, place, and time.  Psychiatric:        Mood and Affect: Mood normal.        Behavior: Behavior normal.        Assessment/Plan: 1. Essential (primary) hypertension (Primary) Stable, continue medications as prescribed.  2. Dizziness Referred to  neurology and palliative care - Ambulatory referral to Neurology  3. Minor head injury, subsequent encounter Referred to neurology and palliative care - Ambulatory referral to Neurology  4. Multiple falls Referred to neurology and palliative care - Ambulatory referral to Neurology - Amb Referral to Palliative Care  5. Poor vision Referred to neurology and palliative care - Ambulatory referral to Neurology - Amb Referral to Palliative Care  6. Unsteady gait Referred to neurology and palliative care - Ambulatory referral to Neurology - Amb Referral to Palliative Care  7. Declining functional status Referred to palliative care - Amb Referral to Palliative Care   General Counseling: janylah belgrave understanding of the findings of todays visit and agrees with plan of treatment. I have discussed any further diagnostic evaluation that may be needed or ordered today. We also reviewed her medications today. she has been encouraged to call the office with any questions or concerns that should arise related to todays visit.    Orders Placed This Encounter  Procedures   Ambulatory referral to Neurology   Amb Referral to Palliative Care    Meds ordered this encounter  Medications   meclizine  (ANTIVERT ) 12.5 MG tablet    Sig: Take 1 tablet (12.5 mg total) by mouth 3 (three) times daily as needed for dizziness.    Dispense:  60 tablet    Refill:  3    Fill new script today    Return if symptoms worsen or fail to improve.   Total time spent:30 Minutes Time spent includes review of chart, medications, test results, and follow up plan with the patient.   [place] Controlled Substance Database was reviewed by me.  This patient was seen by Mardy Maxin, FNP-C in collaboration with Dr. Sigrid Bathe as a part of collaborative care agreement.   Sewell Pitner R. Maxin, MSN, FNP-C Internal medicine

## 2024-03-22 ENCOUNTER — Telehealth: Payer: Self-pay | Admitting: Nurse Practitioner

## 2024-03-22 NOTE — Telephone Encounter (Signed)
 Awaiting 03/21/24 office notes for Neurology referral-Toni

## 2024-03-24 ENCOUNTER — Telehealth: Payer: Self-pay | Admitting: Nurse Practitioner

## 2024-03-24 ENCOUNTER — Encounter: Payer: Self-pay | Admitting: Nurse Practitioner

## 2024-03-24 NOTE — Telephone Encounter (Signed)
 Neurology referral sent via [facility] to [facility]. Notified patient. Gave telephone # [phone]

## 2024-04-05 ENCOUNTER — Telehealth: Payer: Self-pay | Admitting: Nurse Practitioner

## 2024-04-05 NOTE — Telephone Encounter (Signed)
 Patient called confused as to what appointment she has this Friday. She believes it is for another head CT due to her dizziness. Explained to patient, she has an appointment with Dr. Maree w/ [facility]-Toni

## 2024-04-06 ENCOUNTER — Telehealth: Payer: Self-pay | Admitting: Nurse Practitioner

## 2024-04-06 NOTE — Telephone Encounter (Signed)
 Neurology appointment 04/07/2024 with [facility]-Toni

## 2024-04-17 ENCOUNTER — Telehealth: Payer: Self-pay | Admitting: Nurse Practitioner

## 2024-04-17 NOTE — Telephone Encounter (Signed)
 Left vm to confirm 04/24/24 appointment-Toni

## 2024-04-24 ENCOUNTER — Ambulatory Visit: Payer: Medicare HMO | Admitting: Nurse Practitioner

## 2024-04-24 ENCOUNTER — Encounter: Payer: Self-pay | Admitting: Nurse Practitioner

## 2024-04-24 VITALS — BP 114/66 | HR 63 | Temp 96.1°F | Resp 16 | Ht 64.0 in | Wt 131.6 lb

## 2024-04-24 DIAGNOSIS — F331 Major depressive disorder, recurrent, moderate: Secondary | ICD-10-CM | POA: Diagnosis not present

## 2024-04-24 DIAGNOSIS — I5022 Chronic systolic (congestive) heart failure: Secondary | ICD-10-CM | POA: Diagnosis not present

## 2024-04-24 DIAGNOSIS — Z66 Do not resuscitate: Secondary | ICD-10-CM

## 2024-04-24 DIAGNOSIS — J309 Allergic rhinitis, unspecified: Secondary | ICD-10-CM

## 2024-04-24 DIAGNOSIS — I10 Essential (primary) hypertension: Secondary | ICD-10-CM

## 2024-04-24 DIAGNOSIS — F411 Generalized anxiety disorder: Secondary | ICD-10-CM | POA: Diagnosis not present

## 2024-04-24 DIAGNOSIS — E782 Mixed hyperlipidemia: Secondary | ICD-10-CM

## 2024-04-24 DIAGNOSIS — N1831 Chronic kidney disease, stage 3a: Secondary | ICD-10-CM | POA: Diagnosis not present

## 2024-04-24 DIAGNOSIS — G4701 Insomnia due to medical condition: Secondary | ICD-10-CM

## 2024-04-24 DIAGNOSIS — J301 Allergic rhinitis due to pollen: Secondary | ICD-10-CM

## 2024-04-24 DIAGNOSIS — G8929 Other chronic pain: Secondary | ICD-10-CM | POA: Diagnosis not present

## 2024-04-24 DIAGNOSIS — K219 Gastro-esophageal reflux disease without esophagitis: Secondary | ICD-10-CM

## 2024-04-24 DIAGNOSIS — Z23 Encounter for immunization: Secondary | ICD-10-CM

## 2024-04-24 DIAGNOSIS — Z0001 Encounter for general adult medical examination with abnormal findings: Secondary | ICD-10-CM | POA: Diagnosis not present

## 2024-04-24 DIAGNOSIS — K581 Irritable bowel syndrome with constipation: Secondary | ICD-10-CM | POA: Diagnosis not present

## 2024-04-24 DIAGNOSIS — Z0000 Encounter for general adult medical examination without abnormal findings: Secondary | ICD-10-CM

## 2024-04-24 DIAGNOSIS — H1010 Acute atopic conjunctivitis, unspecified eye: Secondary | ICD-10-CM

## 2024-04-24 DIAGNOSIS — Z7189 Other specified counseling: Secondary | ICD-10-CM

## 2024-04-24 MED ORDER — BISOPROLOL-HYDROCHLOROTHIAZIDE 5-6.25 MG PO TABS
1.0000 | ORAL_TABLET | Freq: Every day | ORAL | 6 refills | Status: AC
Start: 2024-04-24 — End: ?

## 2024-04-24 MED ORDER — FLUTICASONE PROPIONATE 50 MCG/ACT NA SUSP: 1.0000 | Freq: Every day | NASAL | 3 refills | Status: AC

## 2024-04-24 MED ORDER — ALBUTEROL SULFATE HFA 108 (90 BASE) MCG/ACT IN AERS: 2.0000 | INHALATION_SPRAY | Freq: Four times a day (QID) | RESPIRATORY_TRACT | 2 refills | Status: AC | PRN

## 2024-04-24 MED ORDER — TRAMADOL HCL 50 MG PO TABS: 50.0000 mg | ORAL_TABLET | Freq: Four times a day (QID) | ORAL | 2 refills | Status: AC

## 2024-04-24 MED ORDER — ESCITALOPRAM OXALATE 10 MG PO TABS: 10.0000 mg | ORAL_TABLET | Freq: Every day | ORAL | 5 refills | Status: AC

## 2024-04-24 MED ORDER — FUROSEMIDE 40 MG PO TABS: 40.0000 mg | ORAL_TABLET | Freq: Every day | ORAL | 11 refills | Status: AC

## 2024-04-24 NOTE — Progress Notes (Unsigned)
 [facility] [address] [place], [place] [address]  Internal MEDICINE  Office Visit Note  Patient Name: Kristen Pennington  [id number]  [id number]  Date of Service: 04/24/2024  Chief Complaint  Patient presents with  . Depression  . Gastroesophageal Reflux  . Hyperlipidemia  . Hypertension  . Medicare Wellness    HPI Ezabella presents for an annual well visit and physical exam.  Well-appearing 89 y.o. female with  Routine CRC screening: discontinued aged out  Routine mammogram: discontinued, aged out  Eye exam: significant for macular degeneration, sees eye doctor regularly. Last visit was about 1 month ago.  Labs: recent labs reviewed  New or worsening pain: chronic pain, no new pains  Other concerns: still having issues with eye sight, memory, speech and walking Using walker for ambulation.  Seen by neurology recently on 04/07/24 for post-concussion syndrome after multiple falls with trauma to the head.  Very hard of hearing Issues with transportation Poor vision Moderate cognitive impairment with memory loss Ambulatory dysfunction  Declining functinal status.      04/24/2024    9:39 AM 04/19/2023   10:05 AM 04/15/2022    3:00 PM  MMSE - Mini Mental State Exam  Orientation to time 5 5 5   Orientation to Place 5 5 5   Registration 3 3 3   Attention/ Calculation 5 5 5   Recall 3 3 3   Language- name 2 objects 2 2 2   Language- repeat 1 1 1   Language- follow 3 step command 3 3 3   Language- read & follow direction 1 1 1   Write a sentence 0 0 0  Copy design 0 1 1  Total score 28 29 29     Functional Status Survey: Is the patient deaf or have difficulty hearing?: Yes Does the patient have difficulty seeing, even when wearing glasses/contacts?: Yes Does the patient have difficulty concentrating, remembering, or making decisions?: Yes Does the patient have difficulty walking or climbing stairs?: Yes Does the patient have difficulty dressing or bathing?: No Does  the patient have difficulty doing errands alone such as visiting a doctor's office or shopping?: Yes     04/15/2022    2:58 PM 11/30/2022   10:48 AM 04/19/2023   10:02 AM 08/24/2023   11:36 AM 04/24/2024    9:37 AM  Fall Risk  Falls in the past year? 0 0 1 0 1  Was there an injury with Fall? 0 0 0  1  Fall Risk Category Calculator 0 0 2  2  Fall Risk Category (Retired) Low       (RETIRED) Patient Fall Risk Level Low fall risk       Patient at Risk for Falls Due to No Fall Risks No Fall Risks     Fall risk Follow up Falls evaluation completed  Falls evaluation completed Falls evaluation completed  Falls evaluation completed     Data saved with a previous flowsheet row definition       04/19/2023   10:46 AM  Depression screen PHQ 2/9  Decreased Interest 2  Down, Depressed, Hopeless 3  PHQ - 2 Score 5  Altered sleeping 2  Tired, decreased energy 3  Change in appetite 1  Feeling bad or failure about yourself  0  Trouble concentrating 3  Moving slowly or fidgety/restless 2  Suicidal thoughts 0  PHQ-9 Score 16   Difficult doing work/chores Very difficult     Data saved with a previous flowsheet row definition  10/01/2021    4:21 PM 06/11/2021    4:39 PM  GAD 7 : Generalized Anxiety Score  Nervous, Anxious, on Edge    Control/stop worrying    Worry too much - different things    Trouble relaxing    Restless    Easily annoyed or irritable    Afraid - awful might happen    Total GAD 7 Score       Information is confidential and restricted. Go to Review Flowsheets to unlock data.      Current Medication: Outpatient Encounter Medications as of 04/24/2024  Medication Sig  . acetaminophen  (TYLENOL ) 500 MG tablet Take 500 mg by mouth every 6 (six) hours as needed.  . albuterol  (VENTOLIN  HFA) 108 (90 Base) MCG/ACT inhaler Inhale 2 puffs into the lungs every 6 (six) hours as needed for wheezing or shortness of breath.  . bisoprolol -hydrochlorothiazide  (ZIAC ) 5-6.25 MG  tablet Take 1 tablet by mouth daily.  . Cholecalciferol  (VITAMIN D3) 5000 units TABS Take 5,000 Units by mouth daily.  . escitalopram  (LEXAPRO ) 10 MG tablet Take 1 tablet (10 mg total) by mouth daily.  . estradiol  (ESTRACE ) 0.1 MG/GM vaginal cream USE ONE APPLICATORFUL AS DIRECTED ONCE WEEKLY AS NEEDED  . fluticasone  (FLONASE ) 50 MCG/ACT nasal spray Place 1 spray into both nostrils daily. 1 SPRAY IN EACH NOSTRIL ONCE DAILY AS NEEDED  . furosemide  (LASIX ) 40 MG tablet Take 1 tablet (40 mg total) by mouth daily.  [name] levocetirizine (XYZAL ) 5 MG tablet Take 1 tablet (5 mg total) by mouth every evening.  . lubiprostone  (AMITIZA ) 8 MCG capsule Take 1 capsule (8 mcg total) by mouth at bedtime. TAKE 1 CAPSULE BY MOUTH ONCE DAILY FOR CONSTIPATION  . magnesium  oxide (MAG-OX) 400 MG tablet Take 1 tablet (400 mg total) by mouth at bedtime. At bedtime  . meclizine  (ANTIVERT ) 12.5 MG tablet Take 1 tablet (12.5 mg total) by mouth 3 (three) times daily as needed for dizziness.  . Menthol-Methyl Salicylate  (SALONPAS  PAIN RELIEF  PATCH) PTCH Apply 1 patch topically daily as needed (Pain).  . mupirocin  ointment (BACTROBAN ) 2 % APPLY 1 APPLICATION TOPICALLY EVERY DAY TO ABRASIONS SCATTERED ON SKIN UNTILHEALED  . polyethylene glycol powder (GLYCOLAX /MIRALAX ) 17 GM/SCOOP powder Take 17 g by mouth daily as needed for mild constipation or moderate constipation.  . rOPINIRole  (REQUIP ) 1 MG tablet TAKE 1 TABLET BY MOUTH EVERY MORNING AND2 TABLETS AT BEDTIME AS DIRECTED.  . traMADol  (ULTRAM ) 50 MG tablet Take 1 tablet (50 mg total) by mouth 4 (four) times daily.  . traZODone  (DESYREL ) 50 MG tablet Take 1 tablet (50 mg total) by mouth at bedtime.  . [DISCONTINUED] albuterol  (VENTOLIN  HFA) 108 (90 Base) MCG/ACT inhaler Inhale 2 puffs into the lungs every 6 (six) hours as needed for wheezing or shortness of breath.  . [DISCONTINUED] bisoprolol -hydrochlorothiazide  (ZIAC ) 5-6.25 MG tablet Take 1 tablet by mouth daily.  .  [DISCONTINUED] escitalopram  (LEXAPRO ) 10 MG tablet TAKE 1 TABLET BY MOUTH DAILY  . [DISCONTINUED] fluticasone  (FLONASE ) 50 MCG/ACT nasal spray Place 1 spray into both nostrils daily. 1 SPRAY IN EACH NOSTRIL ONCE DAILY AS NEEDED  . [DISCONTINUED] furosemide  (LASIX ) 40 MG tablet Take 1 tablet (40 mg total) by mouth daily.  . [DISCONTINUED] promethazine  (PHENERGAN ) 12.5 MG tablet Take 1 tablet (12.5 mg total) by mouth every 6 (six) hours as needed for nausea or vomiting.  . [DISCONTINUED] traMADol  (ULTRAM ) 50 MG tablet Take 1 tablet (50 mg total) by mouth 4 (four) times daily.  . [DISCONTINUED]  triamcinolone  cream (KENALOG ) 0.1 % Apply 1 Application topically 2 (two) times daily. To rash area until resolved.   No facility-administered encounter medications on file as of 04/24/2024.    Surgical History: Past Surgical History:  Procedure Laterality Date  . COLONOSCOPY  05-03-15  . COLONOSCOPY WITH PROPOFOL  N/A 05/03/2015   Procedure: COLONOSCOPY WITH PROPOFOL ;  Surgeon: Rogelia Copping, MD;  Location: [facility];  Service: Endoscopy;  Laterality: N/A;  CPAP  . DILATION AND CURETTAGE OF UTERUS  1970  . ENDOMETRIAL BIOPSY    . EYE SURGERY Right 2017  . POLYPECTOMY  05/03/2015   Procedure: POLYPECTOMY;  Surgeon: Rogelia Copping, MD;  Location[facility];  Service: Endoscopy;;  . ROTATOR CUFF REPAIR  2007  . SPINE SURGERY      Medical History: Past Medical History:  Diagnosis Date  . Arthritis   . Atrophic vaginitis 03/06/2013   Last Assessment & Plan:  She will plan to continue estradiol  vaginal cream.  Prescription was rewritten stipulating use of the generic product.   . Atypical migraine 03/06/2013   Last Assessment & Plan:  Since she rarely takes sumatriptan, we will discontinue it. Continue sparing use of OTC migraine products.   . Avitaminosis D 03/06/2013   Last Assessment & Plan:  Recheck vitamin D  level. Plan to continue vitamin D  supplementation.   . CAP (community  acquired pneumonia) 11/30/2021  . Ceratitis 08/01/2013   Last Assessment & Plan:  Because of the expense, she will discontinue Restasis. She will use over-the-counter moisturizing eyedrops and liquid gel.   . Colon polyp   . 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   . 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   . Cystocele, midline 03/06/2013  . Demoralization and apathy 03/06/2013   Last Assessment & Plan:  Since she has been taking bupropion only once a day, I will write the prescription with the correct instructions and correct quantity. She has done well on this for years and she is encouraged to take it regularly   . Depression   . Environmental allergies   . Epistaxis   . Essential (primary) hypertension 05/17/2013   Last Assessment & Plan:  Her blood pressure is well-controlled. We will plan to continue hydrochlorothiazide  and benazepril.  Both are generic and should be affordable on her health plan.   [name] GERD (gastroesophageal reflux disease)   . Hemorrhoid   . History of delusional disorder 01/18/2019  . Inflammation of sacroiliac joint 03/06/2013   Last Assessment & Plan:  She is doing well on her present regimen of fentanyl  patch supplemented with sparing use of tramadol /acetaminophen . We will continue that regimen.   . Pneumonia 04/03/2023  . Sinusitis   . Sleep apnea    CPAP    Family History: Family History  Problem Relation Age of Onset  . Arthritis Mother   . Alzheimer's disease Father   . Heart disease Sister   . Cancer Brother   . Ovarian cancer Neg Hx   . Breast cancer Neg Hx   . Colon cancer Neg Hx   . Diabetes Neg Hx     Social History   Socioeconomic History  . Marital status: Widowed    Spouse name: Not on file  . Number of children: 2  . Years of education: 54  .  Highest education level: Some college, no degree  Occupational History  . Not on file  Tobacco Use  . Smoking status: Never    Passive exposure: Never  . Smokeless tobacco: Never  Vaping Use  . Vaping status: Never Used  Substance and Sexual Activity  . Alcohol  use: No    Alcohol /week: 0.0 standard drinks of alcohol   . Drug use: No  . Sexual activity: Not Currently  Other Topics Concern  . Not on file  Social History Narrative  . Not on file   Social Drivers of Health   Financial Resource Strain: Low Risk  (04/19/2023)   Overall Financial Resource Strain (CARDIA)   . Difficulty of Paying Living Expenses: Not hard at all  Food Insecurity: No Food Insecurity (04/19/2023)   Hunger Vital Sign   . Worried About Programme Researcher, Broadcasting/film/video in the Last Year: Never true   . Ran Out of Food in the Last Year: Never true  Transportation Needs: No Transportation Needs (04/19/2023)   PRAPARE - Transportation   . Lack of Transportation (Medical): No   . Lack of Transportation (Non-Medical): No  Physical Activity: Inactive (04/19/2023)   Exercise Vital Sign   . Days of Exercise per Week: 0 days   . Minutes of Exercise per Session: 0 min  Stress: Stress Concern Present (04/19/2023)   [employer] of Occupational Health - Occupational Stress Questionnaire   . Feeling of Stress : To some extent  Social Connections: Moderately Isolated (04/19/2023)   Social Connection and Isolation Panel   . Frequency of Communication with Friends and Family: Twice a week   . Frequency of Social Gatherings with Friends and Family: Once a week   . Attends Religious Services: 1 to 4 times per year   . Active Member of Clubs or Organizations: No   . Attends Banker Meetings: Never   . Marital Status: Widowed  Intimate Partner Violence: Not At Risk (04/19/2023)   Humiliation, Afraid, Rape, and Kick questionnaire   . Fear of Current or Ex-Partner: No   . Emotionally Abused: No   . Physically Abused: No    . Sexually Abused: No      Review of Systems  Constitutional:  Negative for chills, diaphoresis and fatigue.  HENT:  Negative for ear pain, postnasal drip and sinus pressure.   Eyes:  Negative for photophobia, discharge, redness, itching and visual disturbance.  Respiratory:  Negative for cough, shortness of breath and wheezing.   Cardiovascular:  Negative for chest pain, palpitations and leg swelling.  Gastrointestinal:  Negative for abdominal pain, constipation, diarrhea, nausea and vomiting.  Genitourinary:  Negative for dysuria and flank pain.  Musculoskeletal:  Negative for arthralgias, back pain, gait problem and neck pain.  Skin:  Negative for color change.  Allergic/Immunologic: Negative for environmental allergies and food allergies.  Neurological:  Negative for dizziness and headaches.  Hematological:  Does not bruise/bleed easily.  Psychiatric/Behavioral:  Positive for behavioral problems (depression -- takes lexapro ), depression and sleep disturbance (takes trazodone ). Negative for agitation, hallucinations, self-injury and suicidal ideas. The patient is nervous/anxious (takes lexapro ).     Vital Signs: BP 114/66   Pulse 63   Temp (!) 96.1 F (35.6 C)   Resp 16   Ht 5' 4 (1.626 m)   Wt 131 lb 9.6 oz (59.7 kg)   SpO2 94%   BMI 22.59 kg/m    Physical Exam Vitals reviewed.  Constitutional:      General: She is not in  acute distress.    Appearance: Normal appearance. She is well-developed and normal weight. She is not ill-appearing or diaphoretic.  HENT:     Head: Normocephalic and atraumatic.     Right Ear: Tympanic membrane, ear canal and external ear normal.     Left Ear: Tympanic membrane, ear canal and external ear normal.     Nose: Nose normal.     Mouth/Throat:     Mouth: Mucous membranes are moist.     Dentition: Abnormal dentition (missing teeth, partial plate).     Pharynx: Oropharynx is clear. No oropharyngeal exudate or posterior oropharyngeal  erythema.  Eyes:     Extraocular Movements: Extraocular movements intact.     Conjunctiva/sclera: Conjunctivae normal.     Pupils: Pupils are equal, round, and reactive to light.  Neck:     Thyroid : No thyromegaly.     Vascular: No JVD.     Trachea: No tracheal deviation.  Cardiovascular:     Rate and Rhythm: Normal rate and regular rhythm.     Pulses: Normal pulses.     Heart sounds: Normal heart sounds. No murmur heard.    No friction rub. No gallop.  Pulmonary:     Effort: Pulmonary effort is normal. No respiratory distress.     Breath sounds: No wheezing or rales.  Chest:     Chest wall: No tenderness.  Abdominal:     General: Bowel sounds are normal.     Palpations: Abdomen is soft.  Musculoskeletal:        General: Normal range of motion.     Cervical back: Normal range of motion and neck supple.  Lymphadenopathy:     Cervical: No cervical adenopathy.  Skin:    General: Skin is warm and dry.     Capillary Refill: Capillary refill takes less than 2 seconds.  Neurological:     Mental Status: She is alert and oriented to person, place, and time.     Cranial Nerves: No cranial nerve deficit.  Psychiatric:        Mood and Affect: Mood normal.        Behavior: Behavior normal.        Thought Content: Thought content normal.        Judgment: Judgment normal.        Assessment/Plan: 1. Encounter for Medicare annual examination with abnormal findings (Primary) Age-appropriate preventive screenings and vaccinations discussed, annual physical exam completed. Routine labs for health maintenance results previously discussed with that . PHM updated.   - albuterol  (VENTOLIN  HFA) 108 (90 Base) MCG/ACT inhaler; Inhale 2 puffs into the lungs every 6 (six) hours as needed for wheezing or shortness of breath.  Dispense: 8 g; Refill: 2  2. Chronic systolic congestive heart failure (HCC) Continue furosemide  as prescribed.  - furosemide  (LASIX ) 40 MG tablet; Take 1 tablet (40 mg  total) by mouth daily.  Dispense: 30 tablet; Refill: 11  3. Essential (primary) hypertension Stable. Continue bisoprolol -hydrochlorothiazide  as prescribed  - bisoprolol -hydrochlorothiazide  (ZIAC ) 5-6.25 MG tablet; Take 1 tablet by mouth daily.  Dispense: 30 tablet; Refill: 6  4. CKD stage 3a, GFR 45-59 ml/min (HCC) Continue furosemide  as prescribed.  - furosemide  (LASIX ) 40 MG tablet; Take 1 tablet (40 mg total) by mouth daily.  Dispense: 30 tablet; Refill: 11  5. Mixed hyperlipidemia Continue low fat low cholesterol diet.   6. Non-seasonal allergic rhinitis due to pollen Continue fluticasone  as prescribed  - fluticasone  (FLONASE ) 50 MCG/ACT nasal spray; Place 1 spray into  both nostrils daily. 1 SPRAY IN EACH NOSTRIL ONCE DAILY AS NEEDED  Dispense: 16 g; Refill: 3  7. Chronic primary generalized pain Continue prn tramadol  as prescribed.  - traMADol  (ULTRAM ) 50 MG tablet; Take 1 tablet (50 mg total) by mouth 4 (four) times daily.  Dispense: 120 tablet; Refill: 2  8. DNR (do not resuscitate) Code status changed to DNR for Do not resuscitated  - Do not attempt resuscitation (DNR)  9. Counseling regarding advanced care planning and goals of care Discussed advanced directives and DNR orders. Patient does not want to be resuscitated in an emergency event if she stops breathing or ha no pulse.   - Do not attempt resuscitation (DNR)  10. Flu vaccine need Flu vaccine administered in office today  - Influenza, MDCK, trivalent, PF(Flucelvax egg-free)  11. Moderate episode of recurrent major depressive disorder (HCC) Continue lexapro  as prescribed.  - escitalopram  (LEXAPRO ) 10 MG tablet; Take 1 tablet (10 mg total) by mouth daily.  Dispense: 30 tablet; Refill: 5  12. Generalized anxiety disorder Continue lexapro  as prescribed. - escitalopram  (LEXAPRO ) 10 MG tablet; Take 1 tablet (10 mg total) by mouth daily.  Dispense: 30 tablet; Refill: 5      General Counseling: deshon koslowski  understanding of the findings of todays visit and agrees with plan of treatment. I have discussed any further diagnostic evaluation that may be needed or ordered today. We also reviewed her medications today. she has been encouraged to call the office with any questions or concerns that should arise related to todays visit.    Orders Placed This Encounter  Procedures  . Influenza, MDCK, trivalent, PF(Flucelvax egg-free)    Meds ordered this encounter  Medications  . albuterol  (VENTOLIN  HFA) 108 (90 Base) MCG/ACT inhaler    Sig: Inhale 2 puffs into the lungs every 6 (six) hours as needed for wheezing or shortness of breath.    Dispense:  8 g    Refill:  2    Please deliver asap for patient for SOB  . bisoprolol -hydrochlorothiazide  (ZIAC ) 5-6.25 MG tablet    Sig: Take 1 tablet by mouth daily.    Dispense:  30 tablet    Refill:  6    Please make sure medication is in her pill pack  . escitalopram  (LEXAPRO ) 10 MG tablet    Sig: Take 1 tablet (10 mg total) by mouth daily.    Dispense:  30 tablet    Refill:  5    FOR FUTURE REFILLS **BUBBLE PACK PT  . fluticasone  (FLONASE ) 50 MCG/ACT nasal spray    Sig: Place 1 spray into both nostrils daily. 1 SPRAY IN EACH NOSTRIL ONCE DAILY AS NEEDED    Dispense:  16 g    Refill:  3    FOR NEXT FILL  . furosemide  (LASIX ) 40 MG tablet    Sig: Take 1 tablet (40 mg total) by mouth daily.    Dispense:  30 tablet    Refill:  11    Please make sure medication is in her pill pack  . traMADol  (ULTRAM ) 50 MG tablet    Sig: Take 1 tablet (50 mg total) by mouth 4 (four) times daily.    Dispense:  120 tablet    Refill:  2    FOR FUTURE REFILLS **BUBBLE PACK PT    Return in about 2 months (around 06/24/2024) for F/U, Alianna Wurster PCP.   Total time spent:30 Minutes Time spent includes review of chart, medications, test results, and follow up  plan with the patient.   [place] Controlled Substance Database was reviewed by me.  This patient was seen by Mardy Maxin, FNP-C in collaboration with Dr. Sigrid Bathe as a part of collaborative care agreement.  Florene Brill R. Maxin, MSN, FNP-C Internal medicine

## 2024-04-26 ENCOUNTER — Other Ambulatory Visit: Payer: Self-pay

## 2024-04-26 ENCOUNTER — Telehealth: Payer: Self-pay

## 2024-04-26 MED ORDER — NITROFURANTOIN MONOHYD MACRO 100 MG PO CAPS: 100.0000 mg | ORAL_CAPSULE | Freq: Two times a day (BID) | ORAL | 0 refills | Status: AC

## 2024-04-26 NOTE — Telephone Encounter (Signed)
 Pt called that she is having Uti symptoms as per alyssa sent macrobid 

## 2024-04-28 ENCOUNTER — Emergency Department

## 2024-04-28 ENCOUNTER — Emergency Department
Admission: EM | Admit: 2024-04-28 | Discharge: 2024-04-28 | Disposition: A | Discharge status: Home / Self Care | Attending: Emergency Medicine | Admitting: Emergency Medicine

## 2024-04-28 DIAGNOSIS — R531 Weakness: Secondary | ICD-10-CM | POA: Diagnosis not present

## 2024-04-28 DIAGNOSIS — R918 Other nonspecific abnormal finding of lung field: Secondary | ICD-10-CM | POA: Diagnosis not present

## 2024-04-28 DIAGNOSIS — Z6821 Body mass index (BMI) 21.0-21.9, adult: Secondary | ICD-10-CM | POA: Diagnosis not present

## 2024-04-28 DIAGNOSIS — I700 Atherosclerosis of aorta: Secondary | ICD-10-CM | POA: Insufficient documentation

## 2024-04-28 DIAGNOSIS — R0602 Shortness of breath: Secondary | ICD-10-CM | POA: Diagnosis not present

## 2024-04-28 DIAGNOSIS — R509 Fever, unspecified: Secondary | ICD-10-CM | POA: Diagnosis not present

## 2024-04-28 DIAGNOSIS — R630 Anorexia: Secondary | ICD-10-CM | POA: Insufficient documentation

## 2024-04-28 DIAGNOSIS — E860 Dehydration: Secondary | ICD-10-CM | POA: Diagnosis not present

## 2024-04-28 DIAGNOSIS — R519 Headache, unspecified: Secondary | ICD-10-CM | POA: Insufficient documentation

## 2024-04-28 DIAGNOSIS — I10 Essential (primary) hypertension: Secondary | ICD-10-CM | POA: Insufficient documentation

## 2024-04-28 DIAGNOSIS — R1111 Vomiting without nausea: Secondary | ICD-10-CM | POA: Diagnosis not present

## 2024-04-28 DIAGNOSIS — R110 Nausea: Secondary | ICD-10-CM | POA: Diagnosis not present

## 2024-04-28 DIAGNOSIS — R197 Diarrhea, unspecified: Secondary | ICD-10-CM | POA: Diagnosis not present

## 2024-04-28 LAB — CBC WITH DIFFERENTIAL/PLATELET
Abs Immature Granulocytes: 0.05 K/uL (ref 0.00–0.07)
Basophils Absolute: 0 K/uL (ref 0.0–0.1)
Basophils Relative: 0 %
Eosinophils Absolute: 0.1 K/uL (ref 0.0–0.5)
Eosinophils Relative: 1 %
HCT: 40.6 % (ref 36.0–46.0)
Hemoglobin: 13.5 g/dL (ref 12.0–15.0)
Immature Granulocytes: 0 %
Lymphocytes Relative: 2 %
Lymphs Abs: 0.2 K/uL — ABNORMAL LOW (ref 0.7–4.0)
MCH: 31.6 pg (ref 26.0–34.0)
MCHC: 33.3 g/dL (ref 30.0–36.0)
MCV: 95.1 fL (ref 80.0–100.0)
Monocytes Absolute: 0.4 K/uL (ref 0.1–1.0)
Monocytes Relative: 3 %
Neutro Abs: 12.3 K/uL — ABNORMAL HIGH (ref 1.7–7.7)
Neutrophils Relative %: 94 %
Platelets: 209 K/uL (ref 150–400)
RBC: 4.27 MIL/uL (ref 3.87–5.11)
RDW: 13.2 % (ref 11.5–15.5)
WBC: 13 K/uL — ABNORMAL HIGH (ref 4.0–10.5)
nRBC: 0 % (ref 0.0–0.2)

## 2024-04-28 LAB — RESP PANEL BY RT-PCR (RSV, FLU A&B, COVID)  RVPGX2
Influenza A by PCR: NEGATIVE
Influenza B by PCR: NEGATIVE
Resp Syncytial Virus by PCR: NEGATIVE
SARS Coronavirus 2 by RT PCR: NEGATIVE

## 2024-04-28 LAB — COMPREHENSIVE METABOLIC PANEL WITH GFR
ALT: 66 U/L — ABNORMAL HIGH (ref 0–44)
AST: 96 U/L — ABNORMAL HIGH (ref 15–41)
Albumin: 4 g/dL (ref 3.5–5.0)
Alkaline Phosphatase: 75 U/L (ref 38–126)
Anion gap: 14 (ref 5–15)
BUN: 25 mg/dL — ABNORMAL HIGH (ref 8–23)
CO2: 28 mmol/L (ref 22–32)
Calcium: 9.5 mg/dL (ref 8.9–10.3)
Chloride: 92 mmol/L — ABNORMAL LOW (ref 98–111)
Creatinine, Ser: 1.15 mg/dL — ABNORMAL HIGH (ref 0.44–1.00)
GFR, Estimated: 44 mL/min — ABNORMAL LOW (ref >60)
Glucose, Bld: 96 mg/dL (ref 70–99)
Potassium: 3.1 mmol/L — ABNORMAL LOW (ref 3.5–5.1)
Sodium: 134 mmol/L — ABNORMAL LOW (ref 135–145)
Total Bilirubin: 1.1 mg/dL (ref 0.0–1.2)
Total Protein: 7.6 g/dL (ref 6.5–8.1)

## 2024-04-28 LAB — URINALYSIS, ROUTINE W REFLEX MICROSCOPIC
Bilirubin Urine: NEGATIVE
Glucose, UA: NEGATIVE mg/dL
Hgb urine dipstick: NEGATIVE
Ketones, ur: NEGATIVE mg/dL
Leukocytes,Ua: NEGATIVE
Nitrite: NEGATIVE
Protein, ur: NEGATIVE mg/dL
Specific Gravity, Urine: 1.015 (ref 1.005–1.030)
pH: 5 (ref 5.0–8.0)

## 2024-04-28 LAB — TROPONIN I (HIGH SENSITIVITY): Troponin I (High Sensitivity): 7 ng/L (ref <18)

## 2024-04-28 LAB — LACTIC ACID, PLASMA: Lactic Acid, Venous: 1.6 mmol/L (ref 0.5–1.9)

## 2024-04-28 MED ORDER — SODIUM CHLORIDE 0.9 % IV BOLUS
500.0000 mL | Freq: Once | INTRAVENOUS | Status: AC
  Administered 2024-04-28: 500 mL via INTRAVENOUS

## 2024-04-28 NOTE — Discharge Instructions (Addendum)
 Take your normal medications as prescribed.  Follow-up with your regular doctor.  Return to the ER for new, worsening, or persistent severe weakness, or any other new or worsening symptoms that concern you.  Your blood work shows that your liver enzymes are slightly high.  You should follow-up with your doctor to recheck these and make sure you do not need any other tests.

## 2024-04-28 NOTE — ED Provider Notes (Signed)
 [facility] Provider Note    Event Date/Time   First MD Initiated Contact with Patient 04/28/24 1510     (approximate)   History   Weakness   HPI  Kristen Pennington is a 89 y.o. female with a history of hypertension, hyperlipidemia, GERD, and depression who presents with generalized weakness, decreased appetite, and pain all over her body occurring over the last several days.  The patient states that she had pain from head to toe and now just has a mild left-sided headache.  She has had headaches like this before.  She denies any neck stiffness.  She has had some gagging and spitting up that she thinks is from phlegm, but no vomiting.  However she has not been able to eat or drink over the last couple of days.  She denies any abdominal pain or diarrhea.  She reports some mild shortness of breath.  She has no urinary symptoms.  I reviewed the past medical records for the patient's most recent outpatient counter was with family medicine on 10/13 for an annual exam.  She had no acute issues at that time.   Physical Exam   Triage Vital Signs: ED Triage Vitals  Encounter Vitals Group     BP 04/28/24 1443 (!) 144/73     Girls Systolic BP Percentile --      Girls Diastolic BP Percentile --      Boys Systolic BP Percentile --      Boys Diastolic BP Percentile --      Pulse Rate 04/28/24 1443 68     Resp 04/28/24 1443 18     Temp 04/28/24 1443 98.7 F (37.1 C)     Temp Source 04/28/24 1443 Oral     SpO2 04/28/24 1443 96 %     Weight 04/28/24 1445 132 lb (59.9 kg)     Height 04/28/24 1445 5' 5 (1.651 m)     Head Circumference --      Peak Flow --      Pain Score --      Pain Loc --      Pain Education --      Exclude from Growth Chart --     Most recent vital signs: Vitals:   04/28/24 1830 04/28/24 1850  BP: (!) 112/49   Pulse: 65   Resp: 20   Temp:  98.1 F (36.7 C)  SpO2: 99%      General: Alert and oriented, well-appearing for age, no  distress.  CV:  Good peripheral perfusion.  Resp:  Normal effort.  Lungs CTAB. Abd:  No distention.  Soft and nontender. Other:  Oropharynx clear.  Dry mucous membranes.  EOMI.  PERRLA.  No photophobia.  Normal speech.  Motor intact in all extremities.  Neck supple, full ROM.  No meningeal signs.   ED Results / Procedures / Treatments   Labs (all labs ordered are listed, but only abnormal results are displayed) Labs Reviewed  COMPREHENSIVE METABOLIC PANEL WITH GFR - Abnormal; Notable for the following components:      Result Value   Sodium 134 (*)    Potassium 3.1 (*)    Chloride 92 (*)    BUN 25 (*)    Creatinine, Ser 1.15 (*)    AST 96 (*)    ALT 66 (*)    GFR, Estimated 44 (*)    All other components within normal limits  CBC WITH DIFFERENTIAL/PLATELET - Abnormal; Notable for the following components:  WBC 13.0 (*)    Neutro Abs 12.3 (*)    Lymphs Abs 0.2 (*)    All other components within normal limits  URINALYSIS, ROUTINE W REFLEX MICROSCOPIC - Abnormal; Notable for the following components:   Color, Urine YELLOW (*)    APPearance CLEAR (*)    All other components within normal limits  RESP PANEL BY RT-PCR (RSV, FLU A&B, COVID)  [id number]  LACTIC ACID, PLASMA  TROPONIN I (HIGH SENSITIVITY)     EKG  ED ECG REPORT I, Waylon Cassis, the attending physician, personally viewed and interpreted this ECG.  Date: 04/28/2024 EKG Time: 1440 Rate: 71 Rhythm: normal sinus rhythm QRS Axis: normal Intervals: normal ST/T Wave abnormalities: normal Narrative Interpretation: no evidence of acute ischemia    RADIOLOGY  Chest x-ray: I independently viewed and interpreted the images; there is no focal consolidation or edema  PROCEDURES:  Critical Care performed: No  Procedures   MEDICATIONS ORDERED IN ED: Medications  sodium chloride  0.9 % bolus 500 mL (0 mLs Intravenous Stopped 04/28/24 1654)     IMPRESSION / MDM / ASSESSMENT AND PLAN / ED COURSE  I  reviewed the triage vital signs and the nursing notes.  89 year old female with PMH as noted above presents with generalized weakness, decreased appetite and some gagging, and diffuse bodyaches over the last several days.  On EMS arrival she had a low-grade fever.  Her other vital signs are normal.  She is overall well-appearing.  She has dry mucous membranes but physical exam is otherwise unremarkable for acute findings.  Differential diagnosis includes, but is not limited to, UTI, pneumonia, viral infection, dehydration, other metabolic disturbance.  We will obtain chest x-ray, lab workup, give a fluid bolus, and reassess.  Patient's presentation is most consistent with acute presentation with potential threat to life or bodily function.  The patient is on the cardiac monitor to evaluate for evidence of arrhythmia and/or significant heart rate changes.  ----------------------------------------- 6:55 PM on 04/28/2024 -----------------------------------------  Lab workup shows mild leukocytosis.  CMP shows borderline elevated LFTs but no other acute findings.  Chest x-ray shows some scarring or atelectasis but no evidence of pneumonia.  Troponin and lactate are both normal.  Urinalysis is clear.  The patient now states that she actually is on an antibiotic for urine infection and has been on it for several days.  She has no urinary symptoms at this time.  She also states that she was using multiple blankets and a heating pad right before EMS came and checked her temperature so thinks that her low-grade temperature may have been from this, not a true fever.  In any case, she states she is feeling much better and she is very eager to go home.  Although I did consider inpatient admission given the patient's age with borderline elevated temperature of unknown source, given that the workup is so far negative except for leukocytosis, which is nonspecific, and since the patient is asymptomatic and has  a strong preference to go home, I feel that this is reasonable.  I counseled the patient on the results of the workup.  I gave strict return precautions and she expressed understanding.   FINAL CLINICAL IMPRESSION(S) / ED DIAGNOSES   Final diagnoses:  Weakness     Rx / DC Orders   ED Discharge Orders     None        Note:  This document was prepared using Dragon voice recognition software and may include unintentional dictation errors.  Jacolyn Pae, MD 04/28/24 [phone]

## 2024-04-28 NOTE — ED Triage Notes (Signed)
 Pt BIB EMS for generalized weakness, nausea and diarrhea, and body aches. Pt alert and oriented x4. Lives at home. Ems report fever 100.2. IV tylenol  admin by EMS.

## 2024-04-30 ENCOUNTER — Emergency Department

## 2024-04-30 ENCOUNTER — Other Ambulatory Visit: Payer: Self-pay

## 2024-04-30 ENCOUNTER — Emergency Department: Admission: EM | Admit: 2024-04-30 | Discharge: 2024-05-01 | Disposition: A

## 2024-04-30 DIAGNOSIS — R7989 Other specified abnormal findings of blood chemistry: Secondary | ICD-10-CM | POA: Diagnosis not present

## 2024-04-30 DIAGNOSIS — I10 Essential (primary) hypertension: Secondary | ICD-10-CM | POA: Diagnosis not present

## 2024-04-30 DIAGNOSIS — R531 Weakness: Secondary | ICD-10-CM | POA: Insufficient documentation

## 2024-04-30 DIAGNOSIS — R350 Frequency of micturition: Secondary | ICD-10-CM | POA: Insufficient documentation

## 2024-04-30 DIAGNOSIS — E785 Hyperlipidemia, unspecified: Secondary | ICD-10-CM | POA: Insufficient documentation

## 2024-04-30 DIAGNOSIS — I7090 Unspecified atherosclerosis: Secondary | ICD-10-CM | POA: Insufficient documentation

## 2024-04-30 DIAGNOSIS — R2689 Other abnormalities of gait and mobility: Secondary | ICD-10-CM | POA: Diagnosis not present

## 2024-04-30 DIAGNOSIS — G8929 Other chronic pain: Secondary | ICD-10-CM | POA: Insufficient documentation

## 2024-04-30 DIAGNOSIS — K760 Fatty (change of) liver, not elsewhere classified: Secondary | ICD-10-CM | POA: Diagnosis not present

## 2024-04-30 DIAGNOSIS — M545 Low back pain, unspecified: Secondary | ICD-10-CM | POA: Diagnosis not present

## 2024-04-30 DIAGNOSIS — K5730 Diverticulosis of large intestine without perforation or abscess without bleeding: Secondary | ICD-10-CM | POA: Diagnosis not present

## 2024-04-30 LAB — CBC
HCT: 37 % (ref 36.0–46.0)
Hemoglobin: 12.1 g/dL (ref 12.0–15.0)
MCH: 31.3 pg (ref 26.0–34.0)
MCHC: 32.7 g/dL (ref 30.0–36.0)
MCV: 95.9 fL (ref 80.0–100.0)
Platelets: 205 K/uL (ref 150–400)
RBC: 3.86 MIL/uL — ABNORMAL LOW (ref 3.87–5.11)
RDW: 13.3 % (ref 11.5–15.5)
WBC: 10.2 K/uL (ref 4.0–10.5)
nRBC: 0 % (ref 0.0–0.2)

## 2024-04-30 LAB — URINALYSIS, ROUTINE W REFLEX MICROSCOPIC
Bilirubin Urine: NEGATIVE
Glucose, UA: NEGATIVE mg/dL
Hgb urine dipstick: NEGATIVE
Ketones, ur: NEGATIVE mg/dL
Leukocytes,Ua: NEGATIVE
Nitrite: NEGATIVE
Protein, ur: NEGATIVE mg/dL
Specific Gravity, Urine: 1.015 (ref 1.005–1.030)
pH: 5 (ref 5.0–8.0)

## 2024-04-30 LAB — BASIC METABOLIC PANEL WITH GFR
Anion gap: 14 (ref 5–15)
BUN: 30 mg/dL — ABNORMAL HIGH (ref 8–23)
CO2: 26 mmol/L (ref 22–32)
Calcium: 9.1 mg/dL (ref 8.9–10.3)
Chloride: 99 mmol/L (ref 98–111)
Creatinine, Ser: 1.27 mg/dL — ABNORMAL HIGH (ref 0.44–1.00)
GFR, Estimated: 39 mL/min — ABNORMAL LOW (ref >60)
Glucose, Bld: 106 mg/dL — ABNORMAL HIGH (ref 70–99)
Potassium: 3.1 mmol/L — ABNORMAL LOW (ref 3.5–5.1)
Sodium: 139 mmol/L (ref 135–145)

## 2024-04-30 LAB — HEPATIC FUNCTION PANEL
ALT: 231 U/L — ABNORMAL HIGH (ref 0–44)
AST: 177 U/L — ABNORMAL HIGH (ref 15–41)
Albumin: 3.4 g/dL — ABNORMAL LOW (ref 3.5–5.0)
Alkaline Phosphatase: 185 U/L — ABNORMAL HIGH (ref 38–126)
Bilirubin, Direct: 0.5 mg/dL — ABNORMAL HIGH (ref 0.0–0.2)
Indirect Bilirubin: 0.7 mg/dL (ref 0.3–0.9)
Total Bilirubin: 1.2 mg/dL (ref 0.0–1.2)
Total Protein: 6.4 g/dL — ABNORMAL LOW (ref 6.5–8.1)

## 2024-04-30 MED ORDER — SODIUM CHLORIDE 0.9 % IV BOLUS
1000.0000 mL | Freq: Once | INTRAVENOUS | Status: AC
  Administered 2024-05-01: 1000 mL via INTRAVENOUS

## 2024-04-30 MED ORDER — OXYCODONE-ACETAMINOPHEN 5-325 MG PO TABS
1.0000 | ORAL_TABLET | Freq: Once | ORAL | Status: AC
  Administered 2024-04-30: 1 via ORAL
  Filled 2024-04-30: qty 1

## 2024-04-30 MED ORDER — BISOPROLOL FUMARATE 5 MG PO TABS
5.0000 mg | ORAL_TABLET | Freq: Once | ORAL | Status: AC
  Administered 2024-05-01: 5 mg via ORAL
  Filled 2024-04-30: qty 1

## 2024-04-30 MED ORDER — POTASSIUM CHLORIDE CRYS ER 20 MEQ PO TBCR
40.0000 meq | EXTENDED_RELEASE_TABLET | Freq: Once | ORAL | Status: AC
  Administered 2024-04-30: 40 meq via ORAL
  Filled 2024-04-30: qty 2

## 2024-04-30 MED ORDER — IOHEXOL 300 MG/ML  SOLN
75.0000 mL | Freq: Once | INTRAMUSCULAR | Status: AC | PRN
  Administered 2024-04-30: 75 mL via INTRAVENOUS

## 2024-04-30 NOTE — ED Provider Notes (Signed)
 [facility] Provider Note    Event Date/Time   First MD Initiated Contact with Patient 04/30/24 2258     (approximate)   History   Back Pain   HPI  Kristen Pennington is a 89 y.o. female   female with a history of hypertension, hyperlipidemia, GERD, and depression who presents with lumbar back pain, suprapubic abdominal pain, decreased urination, general weakness, decreased appetite seen in the ED on 2 days ago on 10/17.  Patient lives at home alone with intermittent home health who she reportedly sends away/declinea services.  History is largely obtained by etheleen Pebbles at bedside.  Patient reports worsening lumbar back pain but denies any urinary incontinence lower extremity weakness or sensation changes.  Denies any trauma to the area.  Has not had any fevers or chills.  Reports that she feels generally weak.  Phyllis at bedside reports that patient over the past several months has decompensated and has required more effort from [place].  Patient has been using patches to her back and around-the-clock tramadol .  She denies any headache, neck pain, chest pain shortness of breath or other abdominal pain      Physical Exam   Triage Vital Signs: ED Triage Vitals  Encounter Vitals Group     BP 04/30/24 2221 (!) 194/92     Girls Systolic BP Percentile --      Girls Diastolic BP Percentile --      Boys Systolic BP Percentile --      Boys Diastolic BP Percentile --      Pulse Rate 04/30/24 2221 71     Resp 04/30/24 2221 17     Temp 04/30/24 2221 99.1 F (37.3 C)     Temp Source 04/30/24 2221 Oral     SpO2 04/30/24 2221 97 %     Weight --      Height 04/30/24 2217 5' 5 (1.651 m)     Head Circumference --      Peak Flow --      Pain Score 04/30/24 2217 10     Pain Loc --      Pain Education --      Exclude from Growth Chart --     Most recent vital signs: Vitals:   05/01/24 0000 05/01/24 0200  BP: (!) 176/76 (!) 148/67  Pulse: 81 75  Resp:  14   Temp:  98.2 F (36.8 C)  SpO2: 98% 96%    Nursing Triage Note reviewed. Vital signs reviewed and patients oxygen saturation is normoxic  General: Patient is well nourished, well developed, awake and alert, resting comfortably in no acute distress Head: Normocephalic and atraumatic Eyes: Normal inspection, extraocular muscles intact, no conjunctival pallor Ear, nose, throat: Normal external exam Neck: Normal range of motion Respiratory: Patient is in no respiratory distress, lungs CTAB Cardiovascular: Patient is not tachycardic, RRR without murmur appreciated GI: Abd SNT with no guarding or rebound  Back: Normal inspection of the back with good strength and range of motion throughout all ext No fluctuance over the lumbar spine no redness Extremities: pulses intact with good cap refills, no LE pitting edema or calf tenderness Neuro: The patient is alert and oriented to person, place, and time, appropriately conversive, with 5/5 bilat UE/LE strength, no gross motor or sensory defects noted. Coordination appears to be adequate.  Patient is able to ambulate to the bathroom without difficulty Skin: Warm, dry, and intact Psych: normal mood and affect, no SI or HI  ED Results / Procedures / Treatments   Labs (all labs ordered are listed, but only abnormal results are displayed) Labs Reviewed  URINALYSIS, ROUTINE W REFLEX MICROSCOPIC - Abnormal; Notable for the following components:      Result Value   Color, Urine YELLOW (*)    APPearance CLEAR (*)    All other components within normal limits  BASIC METABOLIC PANEL WITH GFR - Abnormal; Notable for the following components:   Potassium 3.1 (*)    Glucose, Bld 106 (*)    BUN 30 (*)    Creatinine, Ser 1.27 (*)    GFR, Estimated 39 (*)    All other components within normal limits  CBC - Abnormal; Notable for the following components:   RBC 3.86 (*)    All other components within normal limits  HEPATIC FUNCTION PANEL - Abnormal;  Notable for the following components:   Total Protein 6.4 (*)    Albumin 3.4 (*)    AST 177 (*)    ALT 231 (*)    Alkaline Phosphatase 185 (*)    Bilirubin, Direct 0.5 (*)    All other components within normal limits  ACETAMINOPHEN  LEVEL - Abnormal; Notable for the following components:   Acetaminophen  (Tylenol ), Serum <10 (*)    All other components within normal limits  PROTIME-INR  AMMONIA  HEPATITIS PANEL, ACUTE     EKG None  RADIOLOGY CT abdomen and pelvis with iv contrast: No acute abnormality on my independent review interpretation and radiologist agrees CT L-spine: No acute abnormality on my independent review interpretation radiologist agrees Right upper quadrant ultrasound: No acute abnormality on my independent review interpretation and radiologist agrees    PROCEDURES:  Critical Care performed: No  Procedures   MEDICATIONS ORDERED IN ED: Medications  cholecalciferol  (VITAMIN D3) 25 MCG (1000 UNIT) tablet 5,000 Units (has no administration in time range)  escitalopram  (LEXAPRO ) tablet 10 mg (has no administration in time range)  furosemide  (LASIX ) tablet 40 mg (has no administration in time range)  cetirizine  (ZYRTEC ) tablet 10 mg (has no administration in time range)  lubiprostone  (AMITIZA ) capsule 8 mcg (has no administration in time range)  magnesium  oxide (MAG-OX) tablet 400 mg (has no administration in time range)  meclizine  (ANTIVERT ) tablet 12.5 mg (has no administration in time range)  traZODone  (DESYREL ) tablet 50 mg (has no administration in time range)  lidocaine  (LIDODERM ) 5 % 1 patch (1 patch Transdermal Patch Applied 05/01/24 0352)  oxyCODONE  (Oxy IR/ROXICODONE ) immediate release tablet 5 mg (5 mg Oral Given 05/01/24 0435)  rOPINIRole  (REQUIP ) tablet 1 mg (has no administration in time range)    And  rOPINIRole  (REQUIP ) tablet 2 mg (has no administration in time range)  albuterol  (PROVENTIL ) (2.5 MG/3ML) 0.083% nebulizer solution 2.5 mg (has no  administration in time range)  bisoprolol  (ZEBETA ) tablet 5 mg (has no administration in time range)    And  hydrochlorothiazide  (HYDRODIURIL ) tablet 6.25 mg (has no administration in time range)  sodium chloride  0.9 % bolus 1,000 mL (0 mLs Intravenous Stopped 05/01/24 0108)  oxyCODONE -acetaminophen  (PERCOCET/ROXICET) 5-325 MG per tablet 1 tablet (1 tablet Oral Given 04/30/24 2347)  iohexol  (OMNIPAQUE ) 300 MG/ML solution 75 mL (75 mLs Intravenous Contrast Given 04/30/24 2326)  potassium chloride  SA (KLOR-CON  M) CR tablet 40 mEq (40 mEq Oral Given 04/30/24 2358)  bisoprolol  (ZEBETA ) tablet 5 mg (5 mg Oral Given 05/01/24 0026)     IMPRESSION / MDM / ASSESSMENT AND PLAN / ED COURSE  Differential diagnosis includes, but is not limited to, acute on chronic back pain, UTI, retroperitoneal abscess, anemia, electrolyte derangement  ED course: Patient is well-appearing and has no focal neurological deficits.  Abdomen demonstrates no evidence of peritonitis.  CT abdomen pelvis with IV contrast was unremarkable for acute abnormality.  L-spine was unremarkable.  Urinalysis was not consistent with UTI.  Patient's potassium was mildly low and this was repleted.  Surprisingly patient had elevated liver function tests which were not elevated previously 3 days ago.  She does not have any right upper quadrant abdominal tenderness.  Right upper quadrant ultrasound ordered which demonstrated no acute abnormality.  Her Tylenol  level is not elevated, she does not have an elevated INR or ammonia level.  The only liver toxic medication that I see available is tramadol ; will discontinue this.  Patient and family were counseled that she will need her liver enzymes rechecked in 1 to 2 weeks.  Patient's friend T[place] feels uncomfortable with the patient returning home today.  Patient and daughter do request that the patient stay for PT OT and to evaluate for possible skilled nursing facility.   Patient was made a boarder status and home meds written for. Patient will be signed out to oncoming physician.   PLEASE AVOID ANY LIVER TOXIC MEDICATIONS INCLUDING TYLENOL   Clinical Course as of 05/01/24 0557  Sun Apr 30, 2024  2349 Potassium(!): 3.1 Will replete [HD]  2349 Creatinine(!): 1.27 Slightly worse than baseline but receiving some IV fluids [HD]  2359 Urinalysis, Routine w reflex microscopic -Urine, Clean Catch(!) Not consistent with infection [HD]  2359 CT L-SPINE NO CHARGE No acute, appropriate positioning of the hardware [HD]  2359 CT ABDOMEN PELVIS W CONTRAST No acute abnormalities [HD]  Mon May 01, 2024  0012 I explained to the patient and her neighbor at bedside that workup today was reassuring and that she does not need admission.  I attempted to call the patient's daughter via her cell phone and I have left a message with no response.  I have offered boarder status to the patient and she and her neighbor are currently discussing this as patient was told me that she does not want to stay for possible skilled nursing facility placement [HD]  0113 US  Abdomen Limited RUQ (LIVER/GB) No acute abnormality [HD]  0150 Patient resting comfortably and right upper quadrant ultrasound was unremarkable.  I do not have a cause for the patient's elevation in liver function test at this time.  I do wonder whether this could be secondary to her increased use of tramadol .  I have added on additional labs including a hepatic [place] panel ammonia and Tylenol  level along with an INR.  Will wait for these to result before making a decision for boarding versus hospitalist admission [HD]  0315 Acetaminophen  (Tylenol ), S(!): <10 Not elevated [HD]  0315 Ammonia: 16 Not elevated [HD]  0315 Patient continues to deny any abdominal pain [HD]  0315 For the possible tramadol .  At this time I do not have a cause for her elevated LFTs, but she should avoid all liver toxic medications in the interim. Will  make the patient a boarder at this time [HD]  0321 INR: 1.0 Not elevated [HD]  0557 Unfortunately there were no hospitalist beds available.  Case discussed with Dr. Niels Hurst.  They are requesting now ED to ED transfer.  Transfer center will call back [HD]    Clinical Course User Index [HD] Nicholaus Rolland BRAVO, MD   -- Risk:  5 This patient has a high risk of morbidity due to further diagnostic testing or treatment. Rationale: This patient's evaluation and management involve a high risk of morbidity due to the potential severity of presenting symptoms, need for diagnostic testing, and/or initiation of treatment that may require close monitoring. The differential includes conditions with potential for significant deterioration or requiring escalation of care. Treatment decisions in the ED, including medication administration, procedural interventions, or disposition planning, reflect this level of risk. COPA: 5 The patient has the following acute or chronic illness/injury that poses a possible threat to life or bodily function: [X] : The patient has a potentially serious acute condition or an acute exacerbation of a chronic illness requiring urgent evaluation and management in the Emergency Department. The clinical presentation necessitates immediate consideration of life-threatening or function-threatening diagnoses, even if they are ultimately ruled out.   FINAL CLINICAL IMPRESSION(S) / ED DIAGNOSES   Final diagnoses:  Acute on chronic back pain  Elevated liver function tests  Increased urinary frequency  General weakness     Rx / DC Orders   ED Discharge Orders     None        Note:  This document was prepared using Dragon voice recognition software and may include unintentional dictation errors.   Nicholaus Rolland BRAVO, MD 05/01/24 [phone]

## 2024-04-30 NOTE — ED Provider Notes (Incomplete)
 [facility] Provider Note    Event Date/Time   First MD Initiated Contact with Patient 04/30/24 2258     (approximate)   History   Back Pain   HPI  Kristen Pennington is a 89 y.o. female   female with a history of hypertension, hyperlipidemia, GERD, and depression who presents with lumbar back pain, suprapubic abdominal pain, decreased urination, general weakness, decreased appetite seen in the ED on 2 days ago on 10/17.  Patient lives at home alone with intermittent home health who she reportedly sends away/declined services.  History is largely obtained by neighbor      Physical Exam   Triage Vital Signs: ED Triage Vitals  Encounter Vitals Group     BP 04/30/24 2221 (!) 194/92     Girls Systolic BP Percentile --      Girls Diastolic BP Percentile --      Boys Systolic BP Percentile --      Boys Diastolic BP Percentile --      Pulse Rate 04/30/24 2221 71     Resp 04/30/24 2221 17     Temp 04/30/24 2221 99.1 F (37.3 C)     Temp Source 04/30/24 2221 Oral     SpO2 04/30/24 2221 97 %     Weight --      Height 04/30/24 2217 5' 5 (1.651 m)     Head Circumference --      Peak Flow --      Pain Score 04/30/24 2217 10     Pain Loc --      Pain Education --      Exclude from Growth Chart --     Most recent vital signs: Vitals:   04/30/24 2221  BP: (!) 194/92  Pulse: 71  Resp: 17  Temp: 99.1 F (37.3 C)  SpO2: 97%    Nursing Triage Note reviewed. Vital signs reviewed and patients oxygen saturation is normoxic***  General: Patient is well nourished, well developed, awake and alert, resting comfortably in no acute distress Head: Normocephalic and atraumatic Eyes: Normal inspection, extraocular muscles intact, no conjunctival pallor Ear, nose, throat: Normal external exam Neck: Normal range of motion Respiratory: Patient is in no respiratory distress, lungs CTAB Cardiovascular: Patient is not tachycardic, RRR without murmur  appreciated GI: Abd SNT with no guarding or rebound  Back: Normal inspection of the back with good strength and range of motion throughout all ext Extremities: pulses intact with good cap refills, no LE pitting edema or calf tenderness Neuro: The patient is alert and oriented to person, place, and time, appropriately conversive, with 5/5 bilat UE/LE strength, no gross motor or sensory defects noted. Coordination appears to be adequate. Skin: Warm, dry, and intact Psych: normal mood and affect, no SI or HI  ED Results / Procedures / Treatments   Labs (all labs ordered are listed, but only abnormal results are displayed) Labs Reviewed  CBC - Abnormal; Notable for the following components:      Result Value   RBC 3.86 (*)    All other components within normal limits  URINALYSIS, ROUTINE W REFLEX MICROSCOPIC  BASIC METABOLIC PANEL WITH GFR     EKG   RADIOLOGY ***    PROCEDURES:  Critical Care performed: {CriticalCareYesNo:19197::Yes, see critical care procedure note(s),No}  Procedures   MEDICATIONS ORDERED IN ED: Medications - No data to display   IMPRESSION / MDM / ASSESSMENT AND PLAN / ED COURSE  Differential diagnosis includes, but is not limited to, ***    ***   Clinical Course as of 04/30/24 2350  Sun Apr 30, 2024  2349 Potassium(!): 3.1 Will replete [HD]  2349 Creatinine(!): 1.27 Slightly worse than baseline but receiving some IV fluids [HD]    Clinical Course User Index [HD] Nicholaus Rolland BRAVO, MD   -- Risk: 5 This patient has a high risk of morbidity due to further diagnostic testing or treatment. Rationale: This patient's evaluation and management involve a high risk of morbidity due to the potential severity of presenting symptoms, need for diagnostic testing, and/or initiation of treatment that may require close monitoring. The differential includes conditions with potential for significant deterioration or  requiring escalation of care. Treatment decisions in the ED, including medication administration, procedural interventions, or disposition planning, reflect this level of risk. COPA: 5 The patient has the following acute or chronic illness/injury that poses a possible threat to life or bodily function: [X] : The patient has a potentially serious acute condition or an acute exacerbation of a chronic illness requiring urgent evaluation and management in the Emergency Department. The clinical presentation necessitates immediate consideration of life-threatening or function-threatening diagnoses, even if they are ultimately ruled out.   FINAL CLINICAL IMPRESSION(S) / ED DIAGNOSES   Final diagnoses:  None     Rx / DC Orders   ED Discharge Orders     None        Note:  This document was prepared using Dragon voice recognition software and may include unintentional dictation errors.

## 2024-04-30 NOTE — ED Triage Notes (Signed)
 Pt to ed from home via [facility] for back pain in lumbar region. Described as constant pressure, started early this morning. Pt is caox4, in no acute distress. Pt states she has not urinated in 2 days.  142/86 94% RA  78 HR  Pt able to walk with minor assistance.

## 2024-05-01 ENCOUNTER — Emergency Department

## 2024-05-01 DIAGNOSIS — K760 Fatty (change of) liver, not elsewhere classified: Secondary | ICD-10-CM | POA: Diagnosis not present

## 2024-05-01 DIAGNOSIS — R109 Unspecified abdominal pain: Secondary | ICD-10-CM | POA: Diagnosis not present

## 2024-05-01 LAB — PROTIME-INR
INR: 1 (ref 0.8–1.2)
Prothrombin Time: 14 s (ref 11.4–15.2)

## 2024-05-01 LAB — ACETAMINOPHEN LEVEL: Acetaminophen (Tylenol), Serum: <10 ug/mL — ABNORMAL LOW (ref 10–30)

## 2024-05-01 LAB — HEPATITIS PANEL, ACUTE
HCV Ab: NONREACTIVE
Hep A IgM: NONREACTIVE
Hep B C IgM: NONREACTIVE
Hepatitis B Surface Ag: NONREACTIVE

## 2024-05-01 LAB — AMMONIA: Ammonia: 16 umol/L (ref 9–35)

## 2024-05-01 MED ORDER — TRAZODONE HCL 50 MG PO TABS: 50.0000 mg | ORAL_TABLET | Freq: Every day | ORAL | Status: DC

## 2024-05-01 MED ORDER — BISOPROLOL FUMARATE 5 MG PO TABS
5.0000 mg | ORAL_TABLET | Freq: Every day | ORAL | Status: DC
  Administered 2024-05-01: 5 mg via ORAL
  Filled 2024-05-01: qty 1

## 2024-05-01 MED ORDER — ALBUTEROL SULFATE (2.5 MG/3ML) 0.083% IN NEBU: 2.5000 mg | INHALATION_SOLUTION | Freq: Four times a day (QID) | RESPIRATORY_TRACT | Status: DC | PRN

## 2024-05-01 MED ORDER — CETIRIZINE HCL 10 MG PO TABS
10.0000 mg | ORAL_TABLET | Freq: Every evening | ORAL | Status: DC
  Filled 2024-05-01: qty 1

## 2024-05-01 MED ORDER — MECLIZINE HCL 25 MG PO TABS: 12.5000 mg | ORAL_TABLET | Freq: Three times a day (TID) | ORAL | Status: DC | PRN

## 2024-05-01 MED ORDER — HYDROCHLOROTHIAZIDE 12.5 MG PO TABS
6.2500 mg | ORAL_TABLET | Freq: Every day | ORAL | Status: DC
  Administered 2024-05-01: 6.25 mg via ORAL
  Filled 2024-05-01: qty 1

## 2024-05-01 MED ORDER — FUROSEMIDE 40 MG PO TABS
40.0000 mg | ORAL_TABLET | Freq: Every day | ORAL | Status: DC
  Administered 2024-05-01: 40 mg via ORAL
  Filled 2024-05-01: qty 1

## 2024-05-01 MED ORDER — ROPINIROLE HCL 1 MG PO TABS: 2.0000 mg | ORAL_TABLET | Freq: Every day | ORAL | Status: DC

## 2024-05-01 MED ORDER — OXYCODONE HCL 5 MG PO TABS
5.0000 mg | ORAL_TABLET | Freq: Four times a day (QID) | ORAL | Status: DC | PRN
  Administered 2024-05-01: 5 mg via ORAL
  Filled 2024-05-01: qty 1

## 2024-05-01 MED ORDER — ROPINIROLE HCL 1 MG PO TABS
1.0000 mg | ORAL_TABLET | Freq: Every day | ORAL | Status: DC
Start: 2024-05-01 — End: 2024-05-01
  Administered 2024-05-01: 1 mg via ORAL
  Filled 2024-05-01: qty 1

## 2024-05-01 MED ORDER — ESCITALOPRAM OXALATE 10 MG PO TABS
10.0000 mg | ORAL_TABLET | Freq: Every day | ORAL | Status: DC
  Administered 2024-05-01: 10 mg via ORAL
  Filled 2024-05-01: qty 1

## 2024-05-01 MED ORDER — BISOPROLOL-HYDROCHLOROTHIAZIDE 5-6.25 MG PO TABS
1.0000 | ORAL_TABLET | Freq: Every day | ORAL | Status: DC
  Filled 2024-05-01: qty 1

## 2024-05-01 MED ORDER — LUBIPROSTONE 8 MCG PO CAPS: 8.0000 ug | ORAL_CAPSULE | Freq: Every day | ORAL | Status: DC

## 2024-05-01 MED ORDER — ROPINIROLE HCL 1 MG PO TABS: 1.0000 mg | ORAL_TABLET | Freq: Three times a day (TID) | ORAL | Status: DC

## 2024-05-01 MED ORDER — LIDOCAINE 5 % EX PTCH
1.0000 | MEDICATED_PATCH | CUTANEOUS | Status: DC
Start: 2024-05-01 — End: 2024-05-01
  Administered 2024-05-01: 1 via TRANSDERMAL
  Filled 2024-05-01: qty 1

## 2024-05-01 MED ORDER — ALBUTEROL SULFATE HFA 108 (90 BASE) MCG/ACT IN AERS: 2.0000 | INHALATION_SPRAY | Freq: Four times a day (QID) | RESPIRATORY_TRACT | Status: DC | PRN

## 2024-05-01 MED ORDER — MAGNESIUM OXIDE 400 MG PO TABS: 400.0000 mg | ORAL_TABLET | Freq: Every day | ORAL | Status: DC

## 2024-05-01 MED ORDER — VITAMIN D 25 MCG (1000 UNIT) PO TABS
5000.0000 [IU] | ORAL_TABLET | Freq: Every day | ORAL | Status: DC
  Administered 2024-05-01: 5000 [IU] via ORAL
  Filled 2024-05-01: qty 5

## 2024-05-01 NOTE — TOC Initial Note (Addendum)
 Transition of Care [facility]) - Initial/Assessment Note    Patient Details  Name: Kristen Pennington MRN: [id number] Date of Birth: 1929/03/31  Transition [facility] Care [facility]) CM/SW Contact:    Kristen Cooks, RN Phone Number: 05/01/2024, 12:23 PM  Clinical Narrative:                 This CM arrived at bedside introduced role  and completed Initial assessment pt was accompanied by her friend/ neighbor Kristen Pennington  . Pt A&Ox 4 with some assessed forgetfulness during assessment .Pt reports living in an Apt  alone, further sharing she moved to the [place] area 06-03-18 at the request of her [place] from [place] into her current Apt. Pt  further informed her son  died of Covid in 06/03/20.   [name] Pt informed she  has  support provided by her neighbor Kristen primary for things that include transportation to appt, grocery shopping and other miscellaneous things .   Pt expressed Kristen Pennington(Daughter) will sometimes help her sharing, they have a distant relationship.Pt uses [facility] and has been able to afford meds  & PCP is Kristen Pennington(NP) at [facility] . Pt informed she  has access to  DME that's includes 3 walking sticks, RW, and a rollator.  Pt reports not  being in SNF prior to  this admission, but having  HH coordinated pt unable to confirm the name of the agency during assessment . Pt shared her [facility] services were coordinated by her PCP office , this CM attempted. Pt shared her source of income is SSI in the amount of roughly $948 and pt receives food stamps informing she is  unsure of exact amount  believes a little over $200.  This CM asked if pt was agreeable if recommended SNF/ rehab and she declined and shared her plans are to dc back to her home with the services she has in place.    Pt gave this CM permission to speak to daughter Kristen Pennington, called placed  [phone] at which time Kristen Pennington answered call. This CM discussed pt's recent ED visits and tentative dc plan of returning  home at dc even  if SNF is recommended . [name] informed that pt has been very resistant to receiving her guidance with decisions that govern her life right now  given pt is A&O x4 . This CM confirmed that pt has above financial resources in place with Kristen Pennington , and that pt can not afford any out of pocket agency services at this time. [name] shared she lives a multiple story 2 bedroom home in the [place] area with her husband and is not able to accommodate pt to live with her if needed . This CM provided information for the [facility] program to [name] as a resource to inquire about  and see f pt could meet criteria for additional in home her home and in the community . This CM also shared with pt and [name] that with pt's age , falls , & recent frequent ED visit that it is pivotal for pt and Ms.Kristen Pennington to have a detail discussion to devise a stronger plan for pt with her current changes in level of care. [name] verbalized understanding of all information shared .    TOC will cont to follow pt dc planning / care coordination during hospital stay and update as applicable.         Patient Goals and CMS Choice  Expected Discharge Plan and Services                                              Prior Living Arrangements/Services                       Activities of Daily Living      Permission Sought/Granted                  Emotional Assessment              Admission diagnosis:  EMS Back Pain Patient Active Problem List   Diagnosis Date Noted   Moderate episode of recurrent major depressive disorder (HCC) 04/24/2024   Chronic systolic congestive heart failure (HCC) 06/14/2023   Restless leg syndrome 04/04/2023   Rash 11/30/2021   CKD stage 3a, GFR 45-59 ml/min (HCC) 11/30/2021   Insomnia due to medical condition 03/11/2021   Muscle strain of left upper back 12/22/2020   Memory problem 08/29/2020   Chronic shoulder pain (Left) 06/03/2020    Arthralgia of shoulder region (Left) 06/03/2020   Osteoarthritis of shoulder (Left) 06/03/2020   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   Chronic low back pain 05/15/2020   Spinal enthesopathy of lumbar region 05/15/2020   Kissing spine syndrome 05/06/2020   Lumbar facet hypertrophy 02/14/2020   Osteoarthritis of hips (Bilateral) 11/20/2019   Lumbar facet arthropathy 11/20/2019   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/2019   Primary osteoarthritis involving multiple joints 11/20/2019   OSA on CPAP 10/26/2019   Chronic pain syndrome 08/21/2019   Disorder of skeletal system 08/21/2019   History of lumbar spinal fusion 08/21/2019   Chronic hip pain (2ry area of Pain) (Bilateral) (R>L) 08/21/2019   Lumbar facet syndrome (Bilateral) 08/21/2019   Insomnia due to mental condition 01/18/2019   Gastroesophageal reflux disease without esophagitis 11/27/2018   Irritable bowel syndrome with constipation 11/27/2018   Vitamin B12 deficiency 08/17/2018   Elevated TSH 08/17/2018   Generalized anxiety disorder 08/16/2017   Primary generalized (osteo)arthritis 08/16/2017   Benign neoplasm of transverse colon    Benign neoplasm of cecum    Ceratitis 08/01/2013   Keratitis sicca (HCC) 08/01/2013   Essential (primary) hypertension 05/17/2013   Cystocele, midline 03/06/2013   Atypical migraine 03/06/2013   Hyperlipidemia 03/06/2013   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 [place] [address] Phone: [phone] Fax: [phone]     Social Drivers of Health (SDOH) Social History: SDOH Screenings   Food Insecurity: No Food  Insecurity (04/19/2023)  Housing: Unknown (04/07/2024)   Received from [facility]  Transportation Needs: No Transportation Needs (04/19/2023)  Utilities: Not At Risk (04/03/2023)  Alcohol  Screen: Low Risk  (04/19/2023)  Depression (PHQ2-9): High Risk (04/19/2023)  Financial Resource Strain: Low Risk  (04/19/2023)  Physical Activity: Inactive (04/19/2023)  Social Connections: Moderately Isolated (04/19/2023)  Stress: Stress Concern Present (04/19/2023)  Tobacco Use: Low Risk  (04/30/2024)  Health Literacy: Inadequate Health Literacy (04/19/2023)   SDOH Interventions:     Readmission  Risk Interventions     No data to display

## 2024-05-01 NOTE — Evaluation (Signed)
 Physical Therapy Evaluation Patient Details Name: Kristen Pennington MRN: [id number] DOB: 06-29-1929 Today's Date: 05/01/2024  History of Present Illness  89 year old female presented to ER brought in by EMS with generalized weakness, nausea and diarrhea, body aches and low back pain. PMH of hypertension, hyperlipidemia, GERD, and depression  Clinical Impression  Patient resting on ED stretcher upon arrival to room; supportive friend/neighbor, Tilton, present at bedside. Patient alert and oriented to self, location as hospital; grossly unaware of date, time of day.  Does follow simple verbal commands and appropriately engages with therapist.  Generally impulsive at times, but easily redirectable. Bilat UE/LE strength and ROM grossly symmetrical and WFL; no focal weakness appreciated.  Endorses generalized soreness throughout body (FACES 2/10), related to being all worked up. Able to complete bed mobility with mod indep; sit/stand, basic transfers and gait (200') with RW, close sup/mod indep.  Demonstrates reciprocal stepping pattern with good step height/length; brisk cadence; does intermittently pick up walker with turns, obstacle negotiation (due to impulsivity). Do anticipate improvement in walker management, overall safety in familiar environment/routine and with baseline assist device (rollator). Would benefit from skilled PT to address above deficits and promote optimal return to PLOF.; recommend post-acute PT follow up as indicated by interdisciplinary care team.             If plan is discharge home, recommend the following: A little help with walking and/or transfers;A little help with bathing/dressing/bathroom   Can travel by private vehicle        Equipment Recommendations    Recommendations for Other Services       Functional Status Assessment Patient has had a recent decline in their functional status and demonstrates the ability to make significant improvements in function  in a reasonable and predictable amount of time.     Precautions / Restrictions Precautions Precautions: Fall Restrictions Weight Bearing Restrictions Per Provider Order: No      Mobility  Bed Mobility Overal bed mobility: Modified Independent                  Transfers Overall transfer level: Modified independent Equipment used: Rolling walker (2 wheels)                    Ambulation/Gait Ambulation/Gait assistance: Supervision Gait Distance (Feet): 200 Feet Assistive device: Rolling walker (2 wheels)         General Gait Details: reciprocal stepping pattern with good step height/length; brisk cadence; does intermittently pick up walker with turns, obstacle negotiation (due to impulsivity)  Stairs            Wheelchair Mobility     Tilt Bed    Modified Rankin (Stroke Patients Only)       Balance Overall balance assessment: Needs assistance Sitting-balance support: No upper extremity supported, Feet supported Sitting balance-Leahy Scale: Normal     Standing balance support: Bilateral upper extremity supported Standing balance-Leahy Scale: Good                               Pertinent Vitals/Pain Pain Assessment Pain Assessment: No/denies pain    Home Living Family/patient expects to be discharged to:: Private residence Living Arrangements: Alone Available Help at Discharge: Neighbor;Available PRN/intermittently Type of Home: Apartment Home Access: Level entry       Home Layout: One level Home Equipment: Agricultural consultant (2 wheels);Rollator (4 wheels);Shower seat;Hand held shower head Additional Comments: Neighbor, Tilton, provides  check in 3-4x/day    Prior Function Prior Level of Function : Needs assist;Independent/Modified Independent;History of Falls (last six months)             Mobility Comments: neighbor lives upstairs and comes 2-4x/day to check on her; uses a rollator to ambulate; 2 falls about a month  ago; walks to the trash 1-2x/wk if she feels like she can (~154ft) ADLs Comments: IND with ADLs, showers once a week and sponge bathes other days; on waiting list for meals on wheels, daughter has groceries delivered for her; pt no longer cooks or drives     Extremity/Trunk Assessment   Upper Extremity Assessment Upper Extremity Assessment: Overall WFL for tasks assessed    Lower Extremity Assessment Lower Extremity Assessment: Overall WFL for tasks assessed (grossly at least 4/5 throughout)       Communication   Communication Communication: No apparent difficulties    Cognition Arousal: Alert Behavior During Therapy: WFL for tasks assessed/performed, Impulsive   PT - Cognitive impairments: History of cognitive impairments                       PT - Cognition Comments: Oriented to self only, aware of location in hospital.  Follows simple commands, pleasant and cooperative. Intermittently restless and impulsive, but easily redirectable Following commands: Intact       Cueing Cueing Techniques: Verbal cues     General Comments General comments (skin integrity, edema, etc.): VSS throughout session    Exercises     Assessment/Plan    PT Assessment Patient needs continued PT services  PT Problem List Decreased balance;Decreased mobility;Decreased activity tolerance;Decreased cognition       PT Treatment Interventions DME instruction;Gait training;Functional mobility training;Therapeutic activities;Therapeutic exercise;Balance training;Cognitive remediation;Patient/family education    PT Goals (Current goals can be found in the Care Plan section)  Acute Rehab PT Goals Patient Stated Goal: to go home! PT Goal Formulation: With patient Time For Goal Achievement: 05/15/24 Potential to Achieve Goals: Good    Frequency Min 1X/week     Co-evaluation               AM-PAC PT 6 Clicks Mobility  Outcome Measure Help needed turning from your back to your  side while in a flat bed without using bedrails?: None Help needed moving from lying on your back to sitting on the side of a flat bed without using bedrails?: None Help needed moving to and from a bed to a chair (including a wheelchair)?: None Help needed standing up from a chair using your arms (e.g., wheelchair or bedside chair)?: None Help needed to walk in hospital room?: A Little Help needed climbing 3-5 steps with a railing? : A Little 6 Click Score: 22    End of Session   Activity Tolerance: Patient tolerated treatment well Patient left: in bed;with call bell/phone within reach;with bed alarm set;with family/visitor present Nurse Communication: Mobility status PT Visit Diagnosis: Muscle weakness (generalized) (M62.81)    Time: 1430-1440 PT Time Calculation (min) (ACUTE ONLY): 10 min   Charges:   PT Evaluation $PT Eval Low Complexity: 1 Low   PT General Charges $$ ACUTE PT VISIT: 1 Visit         Dezaria Methot H. Delores, PT, DPT, NCS 05/01/24, 2:50 PM [phone]

## 2024-05-01 NOTE — Discharge Instructions (Addendum)
 Your liver function tests were elevated today with an unclear cause.  You did not have any abdominal pain.  These will need to be rechecked in 1 week's time to ensure that these are not continue to elevate.

## 2024-05-01 NOTE — TOC Transition Note (Signed)
 Transition of Care [facility]) - Discharge Note   Patient Details  Name: Kristen Pennington MRN: [id number] Date of Birth: 1929/04/10  Transition of Care [facility]) CM/SW Contact:  Marinda Cooks, RN Phone Number: 05/01/2024, 3:57 PM   Clinical Narrative:    This CM updated by covering MD pt medically cleared to dc today and has active DC order . This CM spoke with  Admission liaison Channing confirmed [facility] is arranged .  DC transportation confirmed for pt with friend who is bedside [facility] team updated . No additional DC needs requested by medical team or identified by CM at this time .     Final next level of care: Home w Home Health Services       Name of family member notified: Daughter Kristen Pennington) [phone] Patient and family notified of of transfer: 05/01/24     HH Arranged: PT, OT, RN, Social Work [employer] Agency: [facility] Date HH Agency Contacted: 05/01/24 Time HH Agency Contacted: 1512 Representative spoke with at [facility] Agency: Channing  Social Drivers of Health (SDOH) Interventions SDOH Screenings   Food Insecurity: No Food Insecurity (04/19/2023)  Housing: Unknown (04/07/2024)   Received from [facility]  Transportation Needs: No Transportation Needs (04/19/2023)  Utilities: Not At Risk (04/03/2023)  Alcohol  Screen: Low Risk  (04/19/2023)  Depression (PHQ2-9): High Risk (04/19/2023)  Financial Resource Strain: Low Risk  (04/19/2023)  Physical Activity: Inactive (04/19/2023)  Social Connections: Moderately Isolated (04/19/2023)  Stress: Stress Concern Present (04/19/2023)  Tobacco Use: Low Risk  (04/30/2024)  Health Literacy: Inadequate Health Literacy (04/19/2023)     Readmission Risk Interventions     No data to display

## 2024-05-01 NOTE — ED Provider Notes (Signed)
 Per CHRISTELLA Saupe, OT Pt is all good to go home from my standpoint! Walking with the RW out in the hallway without issues and neighbor/friend/caregiver Tilton is here to take her when/if she is ready!    I have discussed case with PT OT and TOC.  She has done well with physical therapy and OT evaluations.  TOC affirms the patient has multiple services already in place at home.  At this juncture the patient actually wishes to return home feels well and does not wish to pursue placement as a higher level of care or care home.  She is awake alert, her friend who is her neighbors with her will be taking her home.  TOC is communicated with the patient's family.  Patient would like to return to home and will follow-up with Dr. Liana  Return precautions and treatment recommendations and follow-up discussed with the patient who is agreeable with the plan.    Dicky Anes, MD 05/01/24 [phone]

## 2024-05-01 NOTE — Evaluation (Signed)
 Occupational Therapy Evaluation Patient Details Name: Kristen Pennington MRN: [id number] DOB: 1929/05/09 Today's Date: 05/01/2024   History of Present Illness   89 year old female presented to ER brought in by EMS with generalized weakness, nausea and diarrhea, body aches and low back pain. PMH of hypertension, hyperlipidemia, GERD, and depression     Clinical Impressions Pt was seen for OT evaluation this date. PTA, pt resides in a one level apartment on the ground level. No interior steps. She lives alone, but has a neighbor who lives upstairs that visits her 2-4x/day to check in. At baseline, pt ambulates with a rollator with MOD I household distances and ~100 feet to the dumpsters to take her trash out 1-2x/wk when she feels up for it. Neighbor Tilton present to verify information d/t pt's confusion. Daughter lives in [place] and has groceries delivered for her. Pt does not cook or drive. Tilton reports she had 2 falls last month.  Pt presents with deficits in strength, balance, and activity tolerance, affecting safe and optimal ADL completion. Pt currently requires MOD I for bed mobility and supervision for STS from ED stretcher to RW and for ambulation ~150 ft with no LOB noted, cues for slowing pace. Pt does tend to pick up the walker during turns. She is legally blind at baseline, so required cues for obstacle maneuvering/guidance. Overall, appears close to her baseline function, but will follow acutely to prevent further decline/weakness. Pt is hoping to return home soon with continuation of HH services.      If plan is discharge home, recommend the following:   A little help with walking and/or transfers;A little help with bathing/dressing/bathroom;Direct supervision/assist for medications management;Supervision due to cognitive status;Direct supervision/assist for financial management;Assist for transportation;Assistance with cooking/housework;Help with stairs or ramp for  entrance     Functional Status Assessment   Patient has had a recent decline in their functional status and demonstrates the ability to make significant improvements in function in a reasonable and predictable amount of time.     Equipment Recommendations   None recommended by OT     Recommendations for Other Services         Precautions/Restrictions   Precautions Precautions: Fall Recall of Precautions/Restrictions: Intact Restrictions Weight Bearing Restrictions Per Provider Order: No     Mobility Bed Mobility Overal bed mobility: Modified Independent                  Transfers Overall transfer level: Modified independent Equipment used: Rolling walker (2 wheels)               General transfer comment: STS and ambulation around ED ~150 ft with RW and SBA      Balance Overall balance assessment: Needs assistance Sitting-balance support: Feet supported Sitting balance-Leahy Scale: Good Sitting balance - Comments: stable seated balance at EOB, no LOB   Standing balance support: Reliant on assistive device for balance, Bilateral upper extremity supported Standing balance-Leahy Scale: Good Standing balance comment: RW use and supervision d/t low vision-not in familiar environment                           ADL either performed or assessed with clinical judgement   ADL Overall ADL's : Needs assistance/impaired                     Lower Body Dressing: Supervision/safety Lower Body Dressing Details (indicate cue type and reason): anticipate supervision  Functional mobility during ADLs: Supervision/safety;Contact guard assist;Rolling walker (2 wheels)       Vision Baseline Vision/History: 2 Legally blind Ability to See in Adequate Light: 2 Moderately impaired Patient Visual Report: No change from baseline Additional Comments: pt reports she is legally blind at baseline     Perception         Praxis          Pertinent Vitals/Pain Pain Assessment Pain Assessment: No/denies pain     Extremity/Trunk Assessment Upper Extremity Assessment Upper Extremity Assessment: Overall WFL for tasks assessed   Lower Extremity Assessment Lower Extremity Assessment: Generalized weakness       Communication     Cognition Arousal: Alert Behavior During Therapy: WFL for tasks assessed/performed Cognition: Cognition impaired             OT - Cognition Comments: alert and oriented x3; a little confusion about timelines with falls, etc.                         Cueing  General Comments      VSS throughout session   Exercises Other Exercises Other Exercises: Edu on role of OT in acute setting, DC recommendations and safety/falls prevention strategies.   Shoulder Instructions      Home Living                                          Prior Functioning/Environment                      OT Problem List: Decreased strength   OT Treatment/Interventions: Self-care/ADL training;Therapeutic exercise;Therapeutic activities;Energy conservation;Patient/family education;DME and/or AE instruction;Balance training      OT Goals(Current goals can be found in the care plan section)   Acute Rehab OT Goals Patient Stated Goal: return home OT Goal Formulation: With patient Time For Goal Achievement: 05/15/24 Potential to Achieve Goals: Good ADL Goals Pt Will Perform Lower Body Bathing: with modified independence;sit to/from stand;sitting/lateral leans Pt Will Perform Lower Body Dressing: with modified independence;sitting/lateral leans;sit to/from stand Pt Will Transfer to Toilet: with modified independence;ambulating   OT Frequency:  Min 1X/week    Co-evaluation              AM-PAC OT 6 Clicks Daily Activity     Outcome Measure Help from another person eating meals?: None Help from another person taking care of personal grooming?: None Help  from another person toileting, which includes using toliet, bedpan, or urinal?: A Little Help from another person bathing (including washing, rinsing, drying)?: A Little Help from another person to put on and taking off regular upper body clothing?: None Help from another person to put on and taking off regular lower body clothing?: A Little 6 Click Score: 21   End of Session Equipment Utilized During Treatment: Rolling walker (2 wheels) Nurse Communication: Mobility status  Activity Tolerance: Patient tolerated treatment well Patient left: in bed;with call bell/phone within reach;with family/visitor present  OT Visit Diagnosis: Other abnormalities of gait and mobility (R26.89);Muscle weakness (generalized) (M62.81)                Time: 9158-9078 OT Time Calculation (min): 40 min Charges:  OT General Charges $OT Visit: 1 Visit OT Evaluation $OT Eval Moderate Complexity: 1 Mod OT Treatments $Therapeutic Activity: 23-37 mins  Mance Vallejo, OTR/L 05/01/24, 1:14 PM  Jillianne Gamino E Celisse Ciulla 05/01/2024, 1:08 PM

## 2024-05-01 NOTE — ED Provider Notes (Signed)
 Boarding, awaiting evaluation case management.  Please note, previous note from about 5:55 AM by Dr. Nicholaus is noted in error, this patient is not awaiting ED to ED transfer.  This patient is awaiting TOC consultation and is not transferring.  No acetaminophen  due to elevated LFTs.   Kristen Anes, MD 05/01/24 [phone]

## 2024-05-02 ENCOUNTER — Telehealth: Payer: Self-pay | Admitting: Nurse Practitioner

## 2024-05-02 NOTE — Telephone Encounter (Addendum)
 Per daughter, [facility] will not longer accept her insurance. I was not able to find any other neurologist in the [place] area. Daughter, [name], will call insurance for neurologist-Toni  I s/w Narissa w/ [facility]. She stated after negotiations, they are still accepting patient's insurance. I notified Cindy-Toni

## 2024-05-10 ENCOUNTER — Ambulatory Visit (INDEPENDENT_AMBULATORY_CARE_PROVIDER_SITE_OTHER): Admitting: Nurse Practitioner

## 2024-05-10 ENCOUNTER — Encounter: Payer: Self-pay | Admitting: Nurse Practitioner

## 2024-05-10 VITALS — BP 129/62 | HR 67 | Temp 97.2°F | Resp 16 | Ht 64.0 in | Wt 126.0 lb

## 2024-05-10 DIAGNOSIS — E876 Hypokalemia: Secondary | ICD-10-CM | POA: Diagnosis not present

## 2024-05-10 DIAGNOSIS — Z7189 Other specified counseling: Secondary | ICD-10-CM | POA: Diagnosis not present

## 2024-05-10 DIAGNOSIS — Z789 Other specified health status: Secondary | ICD-10-CM

## 2024-05-10 DIAGNOSIS — R531 Weakness: Secondary | ICD-10-CM | POA: Diagnosis not present

## 2024-05-10 DIAGNOSIS — N1831 Chronic kidney disease, stage 3a: Secondary | ICD-10-CM

## 2024-05-10 DIAGNOSIS — Z66 Do not resuscitate: Secondary | ICD-10-CM | POA: Diagnosis not present

## 2024-05-10 DIAGNOSIS — R296 Repeated falls: Secondary | ICD-10-CM

## 2024-05-10 DIAGNOSIS — R748 Abnormal levels of other serum enzymes: Secondary | ICD-10-CM | POA: Diagnosis not present

## 2024-05-10 DIAGNOSIS — R5381 Other malaise: Secondary | ICD-10-CM

## 2024-05-10 NOTE — Progress Notes (Unsigned)
 [facility] [address] [place], [place] [address]  Internal MEDICINE  Office Visit Note  Patient Name: Kristen Pennington  [id number]  [id number]  Date of Service: 05/10/2024  Chief Complaint  Patient presents with  . Follow-up    ER follow up    HPI Kristen Pennington presents for a follow-up visit for recent ED visit.   Elevated liver enzymes when in the ED. They had planned to admit her for further observation but did not have any beds available in the hospital. She was discharged home.  The patient is currently a DNR. This was done at her previous office visit to renew her order that was done by her previous provider several years ago.   Fibrosis 4 Score = 5.28 Fib-4 interpretation is not validated for people under 35 or over 24 years of age. However, scores under 2.0 are generally considered low risk.   Current Medication: Outpatient Encounter Medications as of 05/10/2024  Medication Sig  . acetaminophen  (TYLENOL ) 500 MG tablet Take 500 mg by mouth every 6 (six) hours as needed.  . albuterol  (VENTOLIN  HFA) 108 (90 Base) MCG/ACT inhaler Inhale 2 puffs into the lungs every 6 (six) hours as needed for wheezing or shortness of breath.  . bisoprolol -hydrochlorothiazide  (ZIAC ) 5-6.25 MG tablet Take 1 tablet by mouth daily.  . Cholecalciferol  (VITAMIN D3) 5000 units TABS Take 5,000 Units by mouth daily.  . escitalopram  (LEXAPRO ) 10 MG tablet Take 1 tablet (10 mg total) by mouth daily.  . estradiol  (ESTRACE ) 0.1 MG/GM vaginal cream USE ONE APPLICATORFUL AS DIRECTED ONCE WEEKLY AS NEEDED  . fluticasone  (FLONASE ) 50 MCG/ACT nasal spray Place 1 spray into both nostrils daily. 1 SPRAY IN EACH NOSTRIL ONCE DAILY AS NEEDED  . furosemide  (LASIX ) 40 MG tablet Take 1 tablet (40 mg total) by mouth daily.  [name] levocetirizine (XYZAL ) 5 MG tablet Take 1 tablet (5 mg total) by mouth every evening.  . lubiprostone  (AMITIZA ) 8 MCG capsule Take 1 capsule (8 mcg total) by mouth at bedtime. TAKE 1  CAPSULE BY MOUTH ONCE DAILY FOR CONSTIPATION  . magnesium  oxide (MAG-OX) 400 MG tablet Take 1 tablet (400 mg total) by mouth at bedtime. At bedtime  . meclizine  (ANTIVERT ) 12.5 MG tablet Take 1 tablet (12.5 mg total) by mouth 3 (three) times daily as needed for dizziness.  . Menthol-Methyl Salicylate  (SALONPAS  PAIN RELIEF  PATCH) PTCH Apply 1 patch topically daily as needed (Pain).  . mupirocin  ointment (BACTROBAN ) 2 % APPLY 1 APPLICATION TOPICALLY EVERY DAY TO ABRASIONS SCATTERED ON SKIN UNTILHEALED  . nitrofurantoin , macrocrystal-monohydrate, (MACROBID ) 100 MG capsule Take 1 capsule (100 mg total) by mouth 2 (two) times daily.  . polyethylene glycol powder (GLYCOLAX /MIRALAX ) 17 GM/SCOOP powder Take 17 g by mouth daily as needed for mild constipation or moderate constipation.  . rOPINIRole  (REQUIP ) 1 MG tablet TAKE 1 TABLET BY MOUTH EVERY MORNING AND2 TABLETS AT BEDTIME AS DIRECTED.  . traMADol  (ULTRAM ) 50 MG tablet Take 1 tablet (50 mg total) by mouth 4 (four) times daily.  . traZODone  (DESYREL ) 50 MG tablet Take 1 tablet (50 mg total) by mouth at bedtime.   No facility-administered encounter medications on file as of 05/10/2024.    Surgical History: Past Surgical History:  Procedure Laterality Date  . COLONOSCOPY  05-03-15  . COLONOSCOPY WITH PROPOFOL  N/A 05/03/2015   Procedure: COLONOSCOPY WITH PROPOFOL ;  Surgeon: Rogelia Copping, MD;  Location: [facility];  Service: Endoscopy;  Laterality: N/A;  CPAP  . DILATION AND CURETTAGE OF  UTERUS  1970  . ENDOMETRIAL BIOPSY    . EYE SURGERY Right 2017  . POLYPECTOMY  05/03/2015   Procedure: POLYPECTOMY;  Surgeon: Rogelia Copping, MD;  Location: [facility];  Service: Endoscopy;;  . ROTATOR CUFF REPAIR  2007  . SPINE SURGERY      Medical History: Past Medical History:  Diagnosis Date  . Arthritis   . Atrophic vaginitis 03/06/2013   Last Assessment & Plan:  She will plan to continue estradiol  vaginal cream.  Prescription was  rewritten stipulating use of the generic product.   . Atypical migraine 03/06/2013   Last Assessment & Plan:  Since she rarely takes sumatriptan, we will discontinue it. Continue sparing use of OTC migraine products.   . Avitaminosis D 03/06/2013   Last Assessment & Plan:  Recheck vitamin D  level. Plan to continue vitamin D  supplementation.   . CAP (community acquired pneumonia) 11/30/2021  . Ceratitis 08/01/2013   Last Assessment & Plan:  Because of the expense, she will discontinue Restasis. She will use over-the-counter moisturizing eyedrops and liquid gel.   . Colon polyp   . 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   . 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   . Cystocele, midline 03/06/2013  . Demoralization and apathy 03/06/2013   Last Assessment & Plan:  Since she has been taking bupropion only once a day, I will write the prescription with the correct instructions and correct quantity. She has done well on this for years and she is encouraged to take it regularly   . Depression   . Environmental allergies   . Epistaxis   . Essential (primary) hypertension 05/17/2013   Last Assessment & Plan:  Her blood pressure is well-controlled. We will plan to continue hydrochlorothiazide  and benazepril.  Both are generic and should be affordable on her health plan.   [name] GERD (gastroesophageal reflux disease)   . Hemorrhoid   . History of delusional disorder 01/18/2019  . Inflammation of sacroiliac joint 03/06/2013   Last Assessment & Plan:  She is doing well on her present regimen of fentanyl  patch supplemented with sparing use of tramadol /acetaminophen . We will continue that regimen.   . Pneumonia 04/03/2023  . Sinusitis   . Sleep apnea    CPAP    Family History: Family History  Problem Relation Age of  Onset  . Arthritis Mother   . Alzheimer's disease Father   . Heart disease Sister   . Cancer Brother   . Ovarian cancer Neg Hx   . Breast cancer Neg Hx   . Colon cancer Neg Hx   . Diabetes Neg Hx     Social History   Socioeconomic History  . Marital status: Widowed    Spouse name: Not on file  . Number of children: 2  . Years of education: 65  . Highest education level: Some college, no degree  Occupational History  . Not on file  Tobacco Use  . Smoking status: Never    Passive exposure: Never  . Smokeless tobacco: Never  Vaping Use  . Vaping status: Never Used  Substance and Sexual Activity  . Alcohol  use: No    Alcohol /week: 0.0 standard drinks of alcohol   . Drug use: No  . Sexual activity: Not Currently  Other Topics Concern  . Not on file  Social History Narrative  . Not on file   Social Drivers of Health   Financial Resource Strain: Low Risk  (04/19/2023)   Overall Financial Resource Strain (CARDIA)   . Difficulty of Paying Living Expenses: Not hard at all  Food Insecurity: No Food Insecurity (04/19/2023)   Hunger Vital Sign   . Worried About Programme Researcher, Broadcasting/film/video in the Last Year: Never true   . Ran Out of Food in the Last Year: Never true  Transportation Needs: No Transportation Needs (04/19/2023)   PRAPARE - Transportation   . Lack of Transportation (Medical): No   . Lack of Transportation (Non-Medical): No  Physical Activity: Inactive (04/19/2023)   Exercise Vital Sign   . Days of Exercise per Week: 0 days   . Minutes of Exercise per Session: 0 min  Stress: Stress Concern Present (04/19/2023)   [employer] of Occupational Health - Occupational Stress Questionnaire   . Feeling of Stress : To some extent  Social Connections: Moderately Isolated (04/19/2023)   Social Connection and Isolation Panel   . Frequency of Communication with Friends and Family: Twice a week   . Frequency of Social Gatherings with Friends and Family: Once a week   . Attends  Religious Services: 1 to 4 times per year   . Active Member of Clubs or Organizations: No   . Attends Banker Meetings: Never   . Marital Status: Widowed  Intimate Partner Violence: Not At Risk (04/19/2023)   Humiliation, Afraid, Rape, and Kick questionnaire   . Fear of Current or Ex-Partner: No   . Emotionally Abused: No   . Physically Abused: No   . Sexually Abused: No      Review of Systems  Vital Signs: BP 129/62   Pulse 67   Temp (!) 97.2 F (36.2 C)   Resp 16   Ht 5' 4 (1.626 m)   Wt 126 lb (57.2 kg)   SpO2 94%   BMI 21.63 kg/m    Physical Exam     Assessment/Plan:   General Counseling: Kristen Pennington verbalizes understanding of the findings of todays visit and agrees with plan of treatment. I have discussed any further diagnostic evaluation that may be needed or ordered today. We also reviewed her medications today. she has been encouraged to call the office with any questions or concerns that should arise related to todays visit.    No orders of the defined types were placed in this encounter.   No orders of the defined types were placed in this encounter.   No follow-ups on file.   Total time spent:*** Minutes Time spent includes review of chart, medications, test results, and follow up plan with the patient.   [place]rist[place] Penning[place] Controlled Substance Database was reviewed by me.  This patient was seen by Mardy Maxin, FNP-C in collaboration with Dr. Sigrid Bathe as a part of collaborative care agreement.   Chalese Peach R. Maxin, MSN, FNP-C Internal medicine

## 2024-05-23 ENCOUNTER — Encounter: Payer: Self-pay | Admitting: Nurse Practitioner

## 2024-06-04 DIAGNOSIS — Z66 Do not resuscitate: Secondary | ICD-10-CM | POA: Insufficient documentation

## 2024-06-04 DIAGNOSIS — R531 Weakness: Secondary | ICD-10-CM | POA: Insufficient documentation

## 2024-06-04 DIAGNOSIS — Z789 Other specified health status: Secondary | ICD-10-CM | POA: Insufficient documentation

## 2024-06-04 DIAGNOSIS — R5381 Other malaise: Secondary | ICD-10-CM | POA: Insufficient documentation

## 2024-06-04 DIAGNOSIS — R296 Repeated falls: Secondary | ICD-10-CM | POA: Insufficient documentation

## 2024-06-05 DIAGNOSIS — I129 Hypertensive chronic kidney disease with stage 1 through stage 4 chronic kidney disease, or unspecified chronic kidney disease: Secondary | ICD-10-CM | POA: Diagnosis not present

## 2024-06-05 DIAGNOSIS — F0781 Postconcussional syndrome: Secondary | ICD-10-CM | POA: Diagnosis not present

## 2024-06-05 DIAGNOSIS — G4733 Obstructive sleep apnea (adult) (pediatric): Secondary | ICD-10-CM | POA: Diagnosis not present

## 2024-06-05 DIAGNOSIS — S060X0D Concussion without loss of consciousness, subsequent encounter: Secondary | ICD-10-CM | POA: Diagnosis not present

## 2024-06-05 DIAGNOSIS — I6529 Occlusion and stenosis of unspecified carotid artery: Secondary | ICD-10-CM | POA: Diagnosis not present

## 2024-06-05 DIAGNOSIS — N182 Chronic kidney disease, stage 2 (mild): Secondary | ICD-10-CM | POA: Diagnosis not present

## 2024-06-05 DIAGNOSIS — S0003XD Contusion of scalp, subsequent encounter: Secondary | ICD-10-CM | POA: Diagnosis not present

## 2024-06-09 ENCOUNTER — Other Ambulatory Visit: Payer: Self-pay | Admitting: Nurse Practitioner

## 2024-06-14 ENCOUNTER — Other Ambulatory Visit: Payer: Self-pay | Admitting: Nurse Practitioner

## 2024-06-26 ENCOUNTER — Ambulatory Visit: Admitting: Nurse Practitioner

## 2024-08-14 ENCOUNTER — Other Ambulatory Visit: Payer: Self-pay | Admitting: Nurse Practitioner

## 2024-08-14 DIAGNOSIS — Z79899 Other long term (current) drug therapy: Secondary | ICD-10-CM

## 2025-04-25 ENCOUNTER — Ambulatory Visit: Admitting: Nurse Practitioner
